# Patient Record
Sex: Female | Born: 1935 | Race: White | Hispanic: No | Smoking: Former smoker
Health system: Southern US, Community
[De-identification: ages and names within clinical notes are randomized; demographics above are authoritative.]

## PROBLEM LIST (undated history)

## (undated) DIAGNOSIS — D649 Anemia, unspecified: Secondary | ICD-10-CM

## (undated) DIAGNOSIS — K219 Gastro-esophageal reflux disease without esophagitis: Secondary | ICD-10-CM

## (undated) DIAGNOSIS — B3781 Candidal esophagitis: Secondary | ICD-10-CM

## (undated) DIAGNOSIS — Z9289 Personal history of other medical treatment: Secondary | ICD-10-CM

## (undated) DIAGNOSIS — I9589 Other hypotension: Secondary | ICD-10-CM

## (undated) DIAGNOSIS — H9190 Unspecified hearing loss, unspecified ear: Secondary | ICD-10-CM

## (undated) DIAGNOSIS — E079 Disorder of thyroid, unspecified: Secondary | ICD-10-CM

## (undated) DIAGNOSIS — F32A Depression, unspecified: Secondary | ICD-10-CM

## (undated) DIAGNOSIS — E039 Hypothyroidism, unspecified: Secondary | ICD-10-CM

## (undated) DIAGNOSIS — T7840XA Allergy, unspecified, initial encounter: Secondary | ICD-10-CM

## (undated) DIAGNOSIS — K229 Disease of esophagus, unspecified: Secondary | ICD-10-CM

## (undated) DIAGNOSIS — M810 Age-related osteoporosis without current pathological fracture: Secondary | ICD-10-CM

## (undated) DIAGNOSIS — I219 Acute myocardial infarction, unspecified: Secondary | ICD-10-CM

## (undated) DIAGNOSIS — E785 Hyperlipidemia, unspecified: Secondary | ICD-10-CM

## (undated) DIAGNOSIS — F419 Anxiety disorder, unspecified: Secondary | ICD-10-CM

## (undated) DIAGNOSIS — F329 Major depressive disorder, single episode, unspecified: Secondary | ICD-10-CM

## (undated) DIAGNOSIS — C649 Malignant neoplasm of unspecified kidney, except renal pelvis: Secondary | ICD-10-CM

## (undated) HISTORY — DX: Disorder of thyroid, unspecified: E07.9

## (undated) HISTORY — PX: BLEPHAROPLASTY: SUR158

## (undated) HISTORY — PX: OTHER SURGICAL HISTORY: SHX169

## (undated) HISTORY — DX: Gastro-esophageal reflux disease without esophagitis: K21.9

## (undated) HISTORY — DX: Anxiety disorder, unspecified: F41.9

## (undated) HISTORY — PX: CORONARY ARTERY BYPASS GRAFT: SHX141

## (undated) HISTORY — PX: COLONOSCOPY: SHX174

## (undated) HISTORY — DX: Depression, unspecified: F32.A

## (undated) HISTORY — DX: Age-related osteoporosis without current pathological fracture: M81.0

## (undated) HISTORY — DX: Malignant neoplasm of unspecified kidney, except renal pelvis: C64.9

## (undated) HISTORY — DX: Anemia, unspecified: D64.9

## (undated) HISTORY — DX: Acute myocardial infarction, unspecified: I21.9

## (undated) HISTORY — PX: ABDOMINAL HYSTERECTOMY: SHX81

## (undated) HISTORY — DX: Candidal esophagitis: B37.81

## (undated) HISTORY — PX: KYPHOSIS SURGERY: SHX114

## (undated) HISTORY — PX: FRACTURE SURGERY: SHX138

## (undated) HISTORY — DX: Allergy, unspecified, initial encounter: T78.40XA

## (undated) HISTORY — DX: Hyperlipidemia, unspecified: E78.5

## (undated) HISTORY — PX: TONSILLECTOMY: SUR1361

## (undated) HISTORY — PX: APPENDECTOMY: SHX54

## (undated) HISTORY — DX: Major depressive disorder, single episode, unspecified: F32.9

## (undated) HISTORY — PX: EYE SURGERY: SHX253

## (undated) HISTORY — PX: TOTAL ABDOMINAL HYSTERECTOMY W/ BILATERAL SALPINGOOPHORECTOMY: SHX83

---

## 2003-08-07 ENCOUNTER — Ambulatory Visit (HOSPITAL_COMMUNITY): Admission: RE | Admit: 2003-08-07 | Discharge: 2003-08-07 | Payer: Self-pay | Admitting: Specialist

## 2003-08-17 ENCOUNTER — Ambulatory Visit (HOSPITAL_COMMUNITY): Admission: RE | Admit: 2003-08-17 | Discharge: 2003-08-17 | Payer: Self-pay | Admitting: Specialist

## 2003-08-29 ENCOUNTER — Ambulatory Visit (HOSPITAL_COMMUNITY): Admission: RE | Admit: 2003-08-29 | Discharge: 2003-08-30 | Payer: Self-pay | Admitting: Specialist

## 2003-08-30 ENCOUNTER — Encounter (INDEPENDENT_AMBULATORY_CARE_PROVIDER_SITE_OTHER): Payer: Self-pay | Admitting: *Deleted

## 2004-10-25 ENCOUNTER — Ambulatory Visit: Payer: Self-pay | Admitting: Family Medicine

## 2005-03-28 ENCOUNTER — Ambulatory Visit: Payer: Self-pay | Admitting: Family Medicine

## 2005-04-08 ENCOUNTER — Ambulatory Visit: Payer: Self-pay | Admitting: Family Medicine

## 2006-04-10 ENCOUNTER — Ambulatory Visit: Payer: Self-pay | Admitting: Family Medicine

## 2006-05-14 ENCOUNTER — Ambulatory Visit: Payer: Self-pay | Admitting: Family Medicine

## 2007-01-12 DIAGNOSIS — F411 Generalized anxiety disorder: Secondary | ICD-10-CM | POA: Insufficient documentation

## 2007-01-12 DIAGNOSIS — I252 Old myocardial infarction: Secondary | ICD-10-CM

## 2007-01-12 DIAGNOSIS — F329 Major depressive disorder, single episode, unspecified: Secondary | ICD-10-CM | POA: Insufficient documentation

## 2007-04-12 ENCOUNTER — Ambulatory Visit: Payer: Self-pay | Admitting: Family Medicine

## 2007-04-12 DIAGNOSIS — E8941 Symptomatic postprocedural ovarian failure: Secondary | ICD-10-CM

## 2007-04-12 DIAGNOSIS — E785 Hyperlipidemia, unspecified: Secondary | ICD-10-CM | POA: Insufficient documentation

## 2007-04-13 ENCOUNTER — Encounter: Payer: Self-pay | Admitting: Family Medicine

## 2007-04-15 ENCOUNTER — Telehealth (INDEPENDENT_AMBULATORY_CARE_PROVIDER_SITE_OTHER): Payer: Self-pay | Admitting: *Deleted

## 2007-04-20 ENCOUNTER — Encounter: Payer: Self-pay | Admitting: Family Medicine

## 2007-04-22 ENCOUNTER — Ambulatory Visit: Payer: Self-pay | Admitting: Family Medicine

## 2007-04-27 ENCOUNTER — Encounter (INDEPENDENT_AMBULATORY_CARE_PROVIDER_SITE_OTHER): Payer: Self-pay | Admitting: *Deleted

## 2007-05-06 ENCOUNTER — Telehealth: Payer: Self-pay | Admitting: Family Medicine

## 2007-05-06 ENCOUNTER — Ambulatory Visit: Payer: Self-pay | Admitting: Family Medicine

## 2007-05-14 LAB — CONVERTED CEMR LAB
BUN: 10 mg/dL (ref 6–23)
Basophils Absolute: 0 10*3/uL (ref 0.0–0.1)
Basophils Relative: 0.4 % (ref 0.0–1.0)
CO2: 29 meq/L (ref 19–32)
Calcium: 9.6 mg/dL (ref 8.4–10.5)
Chloride: 105 meq/L (ref 96–112)
Creatinine, Ser: 1.3 mg/dL — ABNORMAL HIGH (ref 0.4–1.2)
Eosinophils Absolute: 0.1 10*3/uL (ref 0.0–0.6)
Eosinophils Relative: 1 % (ref 0.0–5.0)
Ferritin: 33.2 ng/mL (ref 10.0–291.0)
GFR calc Af Amer: 51 mL/min
GFR calc non Af Amer: 42 mL/min
Glucose, Bld: 120 mg/dL — ABNORMAL HIGH (ref 70–99)
HCT: 35.5 % — ABNORMAL LOW (ref 36.0–46.0)
Hemoglobin: 11.7 g/dL — ABNORMAL LOW (ref 12.0–15.0)
Iron: 111 ug/dL (ref 42–145)
Lymphocytes Relative: 39.7 % (ref 12.0–46.0)
MCHC: 32.9 g/dL (ref 30.0–36.0)
MCV: 99.2 fL (ref 78.0–100.0)
Monocytes Absolute: 0.6 10*3/uL (ref 0.2–0.7)
Monocytes Relative: 9.3 % (ref 3.0–11.0)
Neutro Abs: 3.1 10*3/uL (ref 1.4–7.7)
Neutrophils Relative %: 49.6 % (ref 43.0–77.0)
Platelets: 313 10*3/uL (ref 150–400)
Potassium: 3.5 meq/L (ref 3.5–5.1)
RBC: 3.58 M/uL — ABNORMAL LOW (ref 3.87–5.11)
RDW: 12.1 % (ref 11.5–14.6)
Sodium: 140 meq/L (ref 135–145)
Transferrin: 232.7 mg/dL (ref >=212.0)
WBC: 6.3 10*3/uL (ref 4.5–10.5)

## 2007-08-03 ENCOUNTER — Encounter: Payer: Self-pay | Admitting: Family Medicine

## 2007-08-16 ENCOUNTER — Telehealth (INDEPENDENT_AMBULATORY_CARE_PROVIDER_SITE_OTHER): Payer: Self-pay | Admitting: *Deleted

## 2007-08-27 ENCOUNTER — Telehealth (INDEPENDENT_AMBULATORY_CARE_PROVIDER_SITE_OTHER): Payer: Self-pay | Admitting: *Deleted

## 2007-08-31 ENCOUNTER — Telehealth (INDEPENDENT_AMBULATORY_CARE_PROVIDER_SITE_OTHER): Payer: Self-pay | Admitting: *Deleted

## 2007-09-06 ENCOUNTER — Telehealth (INDEPENDENT_AMBULATORY_CARE_PROVIDER_SITE_OTHER): Payer: Self-pay | Admitting: *Deleted

## 2007-09-08 ENCOUNTER — Telehealth (INDEPENDENT_AMBULATORY_CARE_PROVIDER_SITE_OTHER): Payer: Self-pay | Admitting: *Deleted

## 2007-10-28 ENCOUNTER — Ambulatory Visit: Payer: Self-pay | Admitting: Family Medicine

## 2007-11-02 ENCOUNTER — Encounter: Payer: Self-pay | Admitting: Family Medicine

## 2008-01-04 ENCOUNTER — Telehealth (INDEPENDENT_AMBULATORY_CARE_PROVIDER_SITE_OTHER): Payer: Self-pay | Admitting: *Deleted

## 2008-01-04 ENCOUNTER — Encounter (INDEPENDENT_AMBULATORY_CARE_PROVIDER_SITE_OTHER): Payer: Self-pay | Admitting: *Deleted

## 2008-01-06 ENCOUNTER — Encounter (INDEPENDENT_AMBULATORY_CARE_PROVIDER_SITE_OTHER): Payer: Self-pay | Admitting: *Deleted

## 2008-01-06 ENCOUNTER — Telehealth (INDEPENDENT_AMBULATORY_CARE_PROVIDER_SITE_OTHER): Payer: Self-pay | Admitting: *Deleted

## 2008-02-07 ENCOUNTER — Ambulatory Visit: Payer: Self-pay | Admitting: Family Medicine

## 2008-02-28 ENCOUNTER — Ambulatory Visit: Payer: Self-pay | Admitting: Cardiovascular Disease

## 2008-02-28 ENCOUNTER — Ambulatory Visit: Payer: Self-pay | Admitting: Internal Medicine

## 2008-02-28 ENCOUNTER — Inpatient Hospital Stay (HOSPITAL_COMMUNITY): Admission: EM | Admit: 2008-02-28 | Discharge: 2008-03-02 | Payer: Self-pay | Admitting: Emergency Medicine

## 2008-03-01 ENCOUNTER — Encounter: Payer: Self-pay | Admitting: Internal Medicine

## 2008-03-06 ENCOUNTER — Ambulatory Visit: Payer: Self-pay | Admitting: Family Medicine

## 2008-03-06 ENCOUNTER — Telehealth (INDEPENDENT_AMBULATORY_CARE_PROVIDER_SITE_OTHER): Payer: Self-pay | Admitting: *Deleted

## 2008-03-06 DIAGNOSIS — R5383 Other fatigue: Secondary | ICD-10-CM

## 2008-03-06 DIAGNOSIS — R5381 Other malaise: Secondary | ICD-10-CM

## 2008-03-06 DIAGNOSIS — R531 Weakness: Secondary | ICD-10-CM | POA: Insufficient documentation

## 2008-03-06 DIAGNOSIS — N390 Urinary tract infection, site not specified: Secondary | ICD-10-CM

## 2008-03-06 LAB — CONVERTED CEMR LAB
Bilirubin Urine: NEGATIVE
Glucose, Urine, Semiquant: NEGATIVE
Ketones, urine, test strip: NEGATIVE
Nitrite: NEGATIVE
Specific Gravity, Urine: 1.01
Urobilinogen, UA: NEGATIVE
WBC Urine, dipstick: NEGATIVE
pH: 6

## 2008-03-07 ENCOUNTER — Ambulatory Visit: Payer: Self-pay | Admitting: Family Medicine

## 2008-03-08 ENCOUNTER — Encounter (INDEPENDENT_AMBULATORY_CARE_PROVIDER_SITE_OTHER): Payer: Self-pay | Admitting: *Deleted

## 2008-03-10 ENCOUNTER — Telehealth (INDEPENDENT_AMBULATORY_CARE_PROVIDER_SITE_OTHER): Payer: Self-pay | Admitting: *Deleted

## 2008-03-13 ENCOUNTER — Telehealth (INDEPENDENT_AMBULATORY_CARE_PROVIDER_SITE_OTHER): Payer: Self-pay | Admitting: *Deleted

## 2008-03-15 ENCOUNTER — Encounter: Payer: Self-pay | Admitting: Family Medicine

## 2008-03-16 ENCOUNTER — Ambulatory Visit: Payer: Self-pay | Admitting: Family Medicine

## 2008-03-16 DIAGNOSIS — R944 Abnormal results of kidney function studies: Secondary | ICD-10-CM

## 2008-03-16 LAB — CONVERTED CEMR LAB
BUN: 9 mg/dL (ref 6–23)
Basophils Absolute: 0.1 10*3/uL (ref 0.0–0.1)
Basophils Relative: 1 % (ref 0.0–1.0)
CO2: 34 meq/L — ABNORMAL HIGH (ref 19–32)
Calcium: 13.6 mg/dL (ref 8.4–10.5)
Chloride: 99 meq/L (ref 96–112)
Creatinine, Ser: 1.4 mg/dL — ABNORMAL HIGH (ref 0.4–1.2)
Eosinophils Absolute: 0.1 10*3/uL (ref 0.0–0.7)
Eosinophils Relative: 1 % (ref 0.0–5.0)
Folate: >20 ng/mL
Free T4: 1 ng/dL (ref 0.6–1.6)
GFR calc Af Amer: 47 mL/min
GFR calc non Af Amer: 39 mL/min
Glucose, Bld: 87 mg/dL (ref 70–99)
HCT: 34.8 % — ABNORMAL LOW (ref 36.0–46.0)
Hemoglobin: 12 g/dL (ref 12.0–15.0)
Lymphocytes Relative: 22.6 % (ref 12.0–46.0)
MCHC: 34.4 g/dL (ref 30.0–36.0)
MCV: 98.1 fL (ref 78.0–100.0)
Monocytes Absolute: 0.7 10*3/uL (ref 0.1–1.0)
Monocytes Relative: 8.3 % (ref 3.0–12.0)
Neutro Abs: 5.9 10*3/uL (ref 1.4–7.7)
Neutrophils Relative %: 67.1 % (ref 43.0–77.0)
Platelets: 289 10*3/uL (ref 150–400)
Potassium: 3.5 meq/L (ref 3.5–5.1)
RBC: 3.54 M/uL — ABNORMAL LOW (ref 3.87–5.11)
RDW: 11.6 % (ref 11.5–14.6)
Sodium: 143 meq/L (ref 135–145)
T3, Free: 2.6 pg/mL (ref 2.3–4.2)
TSH: 6.86 microintl units/mL — ABNORMAL HIGH (ref 0.35–5.50)
Vitamin B-12: 1142 pg/mL — ABNORMAL HIGH (ref 211–911)
WBC: 8.8 10*3/uL (ref 4.5–10.5)

## 2008-03-17 ENCOUNTER — Telehealth (INDEPENDENT_AMBULATORY_CARE_PROVIDER_SITE_OTHER): Payer: Self-pay | Admitting: *Deleted

## 2008-03-17 LAB — CONVERTED CEMR LAB
BUN: 16 mg/dL (ref 6–23)
CO2: 29 meq/L (ref 19–32)
Calcium: 12.9 mg/dL — ABNORMAL HIGH (ref 8.4–10.5)
Chloride: 95 meq/L — ABNORMAL LOW (ref 96–112)
Creatinine, Ser: 1.7 mg/dL — ABNORMAL HIGH (ref 0.4–1.2)
GFR calc Af Amer: 37 mL/min
GFR calc non Af Amer: 31 mL/min
Glucose, Bld: 89 mg/dL (ref 70–99)
Potassium: 3.8 meq/L (ref 3.5–5.1)
Sodium: 132 meq/L — ABNORMAL LOW (ref 135–145)

## 2008-03-21 ENCOUNTER — Ambulatory Visit: Payer: Self-pay | Admitting: Family Medicine

## 2008-03-22 LAB — CONVERTED CEMR LAB
ALT: 20 units/L (ref 0–35)
AST: 31 units/L (ref 0–37)
Albumin: 3.6 g/dL (ref 3.5–5.2)
Alkaline Phosphatase: 57 units/L (ref 39–117)
BUN: 17 mg/dL (ref 6–23)
Basophils Absolute: 0.1 10*3/uL (ref 0.0–0.1)
Basophils Relative: 1.4 % (ref 0.0–3.0)
Bilirubin, Direct: 0.1 mg/dL (ref 0.0–0.3)
CO2: 27 meq/L (ref 19–32)
Calcium: 12.1 mg/dL — ABNORMAL HIGH (ref 8.4–10.5)
Chloride: 101 meq/L (ref 96–112)
Creatinine, Ser: 1.8 mg/dL — ABNORMAL HIGH (ref 0.4–1.2)
Eosinophils Absolute: 0.1 10*3/uL (ref 0.0–0.7)
Eosinophils Relative: 1.2 % (ref 0.0–5.0)
Folate: >20 ng/mL
GFR calc Af Amer: 35 mL/min
GFR calc non Af Amer: 29 mL/min
Glucose, Bld: 94 mg/dL (ref 70–99)
HCT: 30.3 % — ABNORMAL LOW (ref 36.0–46.0)
Hemoglobin: 10 g/dL — ABNORMAL LOW (ref 12.0–15.0)
Lymphocytes Relative: 29 % (ref 12.0–46.0)
MCHC: 33.1 g/dL (ref 30.0–36.0)
MCV: 97 fL (ref 78.0–100.0)
Monocytes Absolute: 0.5 10*3/uL (ref 0.1–1.0)
Monocytes Relative: 7.8 % (ref 3.0–12.0)
Neutro Abs: 3.5 10*3/uL (ref 1.4–7.7)
Neutrophils Relative %: 60.6 % (ref 43.0–77.0)
Platelets: 262 10*3/uL (ref 150–400)
Potassium: 4.2 meq/L (ref 3.5–5.1)
RBC: 3.12 M/uL — ABNORMAL LOW (ref 3.87–5.11)
RDW: 11 % — ABNORMAL LOW (ref 11.5–14.6)
Sodium: 136 meq/L (ref 135–145)
TSH: 4.53 microintl units/mL (ref 0.35–5.50)
Total Bilirubin: 0.5 mg/dL (ref 0.3–1.2)
Total Protein: 6.3 g/dL (ref 6.0–8.3)
Vitamin B-12: >1500 pg/mL — ABNORMAL HIGH (ref 211–911)
WBC: 5.9 10*3/uL (ref 4.5–10.5)

## 2008-03-23 ENCOUNTER — Encounter: Payer: Self-pay | Admitting: Family Medicine

## 2008-03-23 ENCOUNTER — Encounter (INDEPENDENT_AMBULATORY_CARE_PROVIDER_SITE_OTHER): Payer: Self-pay | Admitting: *Deleted

## 2008-03-23 ENCOUNTER — Telehealth (INDEPENDENT_AMBULATORY_CARE_PROVIDER_SITE_OTHER): Payer: Self-pay | Admitting: *Deleted

## 2008-03-23 LAB — CONVERTED CEMR LAB: Vit D, 1,25-Dihydroxy: 61 (ref 30–89)

## 2008-03-24 ENCOUNTER — Ambulatory Visit: Payer: Self-pay | Admitting: Family Medicine

## 2008-03-26 ENCOUNTER — Telehealth (INDEPENDENT_AMBULATORY_CARE_PROVIDER_SITE_OTHER): Payer: Self-pay | Admitting: *Deleted

## 2008-03-26 LAB — CONVERTED CEMR LAB
Basophils Absolute: 0 10*3/uL (ref 0.0–0.1)
Basophils Relative: 0.7 % (ref 0.0–3.0)
Lymphocytes Relative: 33.9 % (ref 12.0–46.0)
MCHC: 33.1 g/dL (ref 30.0–36.0)
Neutrophils Relative %: 53.9 % (ref 43.0–77.0)
RBC: 2.81 M/uL — ABNORMAL LOW (ref 3.87–5.11)
WBC: 6.1 10*3/uL (ref 4.5–10.5)

## 2008-03-28 ENCOUNTER — Telehealth (INDEPENDENT_AMBULATORY_CARE_PROVIDER_SITE_OTHER): Payer: Self-pay | Admitting: *Deleted

## 2008-03-28 ENCOUNTER — Encounter (INDEPENDENT_AMBULATORY_CARE_PROVIDER_SITE_OTHER): Payer: Self-pay | Admitting: *Deleted

## 2008-03-28 ENCOUNTER — Ambulatory Visit: Payer: Self-pay | Admitting: Family Medicine

## 2008-03-28 LAB — CONVERTED CEMR LAB
Albumin, U: DETECTED %
Beta, Urine: DETECTED % — AB
Free Kappa/Lambda Ratio: 6.21 — ABNORMAL HIGH (ref 0.46–4.00)
OCCULT 1: NEGATIVE
OCCULT 2: NEGATIVE
OCCULT 3: NEGATIVE
Time: 24
Total Protein, Urine-Ur/day: 43 mg/24hr (ref 10–140)

## 2008-03-31 ENCOUNTER — Ambulatory Visit: Payer: Self-pay | Admitting: Family Medicine

## 2008-03-31 DIAGNOSIS — D649 Anemia, unspecified: Secondary | ICD-10-CM

## 2008-03-31 DIAGNOSIS — D631 Anemia in chronic kidney disease: Secondary | ICD-10-CM | POA: Insufficient documentation

## 2008-03-31 LAB — CONVERTED CEMR LAB: Hemoglobin: 9.9 g/dL

## 2008-04-03 ENCOUNTER — Telehealth (INDEPENDENT_AMBULATORY_CARE_PROVIDER_SITE_OTHER): Payer: Self-pay | Admitting: *Deleted

## 2008-04-06 ENCOUNTER — Telehealth (INDEPENDENT_AMBULATORY_CARE_PROVIDER_SITE_OTHER): Payer: Self-pay | Admitting: *Deleted

## 2008-04-07 ENCOUNTER — Ambulatory Visit: Payer: Self-pay | Admitting: Oncology

## 2008-04-10 ENCOUNTER — Telehealth: Payer: Self-pay | Admitting: Family Medicine

## 2008-04-12 ENCOUNTER — Encounter: Payer: Self-pay | Admitting: Family Medicine

## 2008-04-12 LAB — CBC & DIFF AND RETIC
EOS%: 0.5 % (ref 0.0–7.0)
MCH: 33 pg (ref 26.0–34.0)
MCV: 95.8 fL (ref 81.0–101.0)
MONO%: 9.2 % (ref 0.0–13.0)
NEUT#: 3.5 10*3/uL (ref 1.5–6.5)
RBC: 3.12 10*6/uL — ABNORMAL LOW (ref 3.70–5.32)
RDW: 12.2 % (ref 11.3–14.5)
RETIC #: 15.9 10*3/uL — ABNORMAL LOW (ref 19.7–115.1)
Retic %: 0.5 % (ref 0.4–2.3)
lymph#: 1.3 10*3/uL (ref 0.9–3.3)

## 2008-04-12 LAB — COMPREHENSIVE METABOLIC PANEL
AST: 31 U/L (ref 0–37)
Alkaline Phosphatase: 56 U/L (ref 39–117)
BUN: 11 mg/dL (ref 6–23)
Creatinine, Ser: 1.45 mg/dL — ABNORMAL HIGH (ref 0.40–1.20)
Total Bilirubin: 0.8 mg/dL (ref 0.3–1.2)

## 2008-04-12 LAB — ERYTHROCYTE SEDIMENTATION RATE: Sed Rate: 50 mm/hr — ABNORMAL HIGH (ref 0–30)

## 2008-04-14 LAB — FERRITIN: Ferritin: 230 ng/mL (ref 10–291)

## 2008-04-14 LAB — SPEP & IFE WITH QIG
Albumin ELP: 60 % (ref 55.8–66.1)
Alpha-1-Globulin: 5.5 % — ABNORMAL HIGH (ref 2.9–4.9)
IgM, Serum: 115 mg/dL (ref 60–263)

## 2008-04-14 LAB — KAPPA/LAMBDA LIGHT CHAINS
Kappa:Lambda Ratio: 0.79 (ref 0.26–1.65)
Lambda Free Lght Chn: 2.29 mg/dL (ref 0.57–2.63)

## 2008-04-18 ENCOUNTER — Encounter: Payer: Self-pay | Admitting: Oncology

## 2008-04-18 ENCOUNTER — Other Ambulatory Visit: Admission: RE | Admit: 2008-04-18 | Discharge: 2008-04-18 | Payer: Self-pay | Admitting: Oncology

## 2008-04-18 ENCOUNTER — Encounter: Payer: Self-pay | Admitting: Family Medicine

## 2008-04-24 ENCOUNTER — Encounter: Payer: Self-pay | Admitting: Family Medicine

## 2008-04-25 LAB — CBC WITH DIFFERENTIAL/PLATELET
Eosinophils Absolute: 0 10*3/uL (ref 0.0–0.5)
LYMPH%: 26.3 % (ref 14.0–48.0)
MONO#: 0.5 10*3/uL (ref 0.1–0.9)
NEUT#: 3 10*3/uL (ref 1.5–6.5)
Platelets: 279 10*3/uL (ref 145–400)
RBC: 2.88 10*6/uL — ABNORMAL LOW (ref 3.70–5.32)
RDW: 12.5 % (ref 11.3–14.5)
WBC: 4.8 10*3/uL (ref 3.9–10.0)

## 2008-04-26 LAB — BASIC METABOLIC PANEL
Chloride: 106 mEq/L (ref 96–112)
Creatinine, Ser: 1.28 mg/dL — ABNORMAL HIGH (ref 0.40–1.20)
Potassium: 3.8 mEq/L (ref 3.5–5.3)

## 2008-04-26 LAB — PTH, INTACT AND CALCIUM
Calcium, Total (PTH): 10.1 mg/dL (ref 8.4–10.5)
PTH: 8.5 pg/mL — ABNORMAL LOW (ref 14.0–72.0)

## 2008-05-08 ENCOUNTER — Encounter: Payer: Self-pay | Admitting: Family Medicine

## 2008-05-08 LAB — CBC WITH DIFFERENTIAL/PLATELET
BASO%: 0.4 % (ref 0.0–2.0)
EOS%: 1 % (ref 0.0–7.0)
HCT: 34.1 % — ABNORMAL LOW (ref 34.8–46.6)
HGB: 11.5 g/dL — ABNORMAL LOW (ref 11.6–15.9)
MCH: 34 pg (ref 26.0–34.0)
MCHC: 33.7 g/dL (ref 32.0–36.0)
MONO#: 0.5 10*3/uL (ref 0.1–0.9)
NEUT%: 55.6 % (ref 39.6–76.8)
RDW: 16.6 % — ABNORMAL HIGH (ref 11.3–14.5)
WBC: 4.2 10*3/uL (ref 3.9–10.0)
lymph#: 1.3 10*3/uL (ref 0.9–3.3)

## 2008-05-10 ENCOUNTER — Ambulatory Visit: Payer: Self-pay | Admitting: Family Medicine

## 2008-05-10 DIAGNOSIS — E039 Hypothyroidism, unspecified: Secondary | ICD-10-CM

## 2008-05-11 ENCOUNTER — Encounter: Payer: Self-pay | Admitting: Family Medicine

## 2008-05-15 ENCOUNTER — Encounter (INDEPENDENT_AMBULATORY_CARE_PROVIDER_SITE_OTHER): Payer: Self-pay | Admitting: *Deleted

## 2008-05-15 ENCOUNTER — Telehealth (INDEPENDENT_AMBULATORY_CARE_PROVIDER_SITE_OTHER): Payer: Self-pay | Admitting: *Deleted

## 2008-05-16 ENCOUNTER — Telehealth (INDEPENDENT_AMBULATORY_CARE_PROVIDER_SITE_OTHER): Payer: Self-pay | Admitting: *Deleted

## 2008-05-22 LAB — CBC WITH DIFFERENTIAL/PLATELET
Basophils Absolute: 0 10*3/uL (ref 0.0–0.1)
EOS%: 0.8 % (ref 0.0–7.0)
Eosinophils Absolute: 0.1 10*3/uL (ref 0.0–0.5)
HCT: 40.2 % (ref 34.8–46.6)
HGB: 13.4 g/dL (ref 11.6–15.9)
MCH: 33.8 pg (ref 26.0–34.0)
MONO#: 0.6 10*3/uL (ref 0.1–0.9)
NEUT#: 3.9 10*3/uL (ref 1.5–6.5)
NEUT%: 55.8 % (ref 39.6–76.8)
RDW: 16.3 % — ABNORMAL HIGH (ref 11.3–14.5)
WBC: 7 10*3/uL (ref 3.9–10.0)
lymph#: 2.4 10*3/uL (ref 0.9–3.3)

## 2008-05-23 ENCOUNTER — Encounter: Payer: Self-pay | Admitting: Family Medicine

## 2008-05-30 ENCOUNTER — Ambulatory Visit: Payer: Self-pay | Admitting: Family Medicine

## 2008-05-30 LAB — CONVERTED CEMR LAB
Bilirubin Urine: NEGATIVE
Ketones, urine, test strip: NEGATIVE
Nitrite: NEGATIVE
Specific Gravity, Urine: 1.02

## 2008-05-31 ENCOUNTER — Encounter: Payer: Self-pay | Admitting: Family Medicine

## 2008-05-31 ENCOUNTER — Telehealth (INDEPENDENT_AMBULATORY_CARE_PROVIDER_SITE_OTHER): Payer: Self-pay | Admitting: *Deleted

## 2008-06-01 ENCOUNTER — Inpatient Hospital Stay (HOSPITAL_COMMUNITY): Admission: EM | Admit: 2008-06-01 | Discharge: 2008-06-07 | Payer: Self-pay | Admitting: Emergency Medicine

## 2008-06-01 ENCOUNTER — Ambulatory Visit: Payer: Self-pay | Admitting: Internal Medicine

## 2008-06-02 ENCOUNTER — Encounter: Payer: Self-pay | Admitting: Family Medicine

## 2008-06-03 ENCOUNTER — Encounter: Payer: Self-pay | Admitting: Family Medicine

## 2008-06-05 ENCOUNTER — Encounter (INDEPENDENT_AMBULATORY_CARE_PROVIDER_SITE_OTHER): Payer: Self-pay | Admitting: *Deleted

## 2008-06-06 ENCOUNTER — Encounter: Payer: Self-pay | Admitting: Family Medicine

## 2008-06-08 ENCOUNTER — Encounter (INDEPENDENT_AMBULATORY_CARE_PROVIDER_SITE_OTHER): Payer: Self-pay | Admitting: *Deleted

## 2008-06-08 LAB — CONVERTED CEMR LAB
Basophils Absolute: 0.2 10*3/uL — ABNORMAL HIGH (ref 0.0–0.1)
Basophils Relative: 2.6 % (ref 0.0–3.0)
Eosinophils Absolute: 0 10*3/uL (ref 0.0–0.7)
Lymphocytes Relative: 9 % — ABNORMAL LOW (ref 12.0–46.0)
MCHC: 33.8 g/dL (ref 30.0–36.0)
MCV: 101.3 fL — ABNORMAL HIGH (ref 78.0–100.0)
Neutrophils Relative %: 84.6 % — ABNORMAL HIGH (ref 43.0–77.0)
Platelets: 228 10*3/uL (ref 150–400)
RBC: 3.94 M/uL (ref 3.87–5.11)

## 2008-06-16 ENCOUNTER — Ambulatory Visit: Payer: Self-pay | Admitting: Oncology

## 2008-06-20 LAB — CBC WITH DIFFERENTIAL/PLATELET
Eosinophils Absolute: 0 10*3/uL (ref 0.0–0.5)
MCV: 97.6 fL (ref 81.0–101.0)
MONO#: 0.3 10*3/uL (ref 0.1–0.9)
MONO%: 9.2 % (ref 0.0–13.0)
NEUT#: 2 10*3/uL (ref 1.5–6.5)
RBC: 3.7 10*6/uL (ref 3.70–5.32)
RDW: 13.4 % (ref 11.3–14.5)
WBC: 3.6 10*3/uL — ABNORMAL LOW (ref 3.9–10.0)
lymph#: 1.2 10*3/uL (ref 0.9–3.3)

## 2008-06-23 ENCOUNTER — Ambulatory Visit: Payer: Self-pay | Admitting: Family Medicine

## 2008-06-30 ENCOUNTER — Encounter (INDEPENDENT_AMBULATORY_CARE_PROVIDER_SITE_OTHER): Payer: Self-pay | Admitting: *Deleted

## 2008-06-30 LAB — CONVERTED CEMR LAB
Basophils Absolute: 0 10*3/uL (ref 0.0–0.1)
CO2: 21 meq/L (ref 19–32)
Calcium: 8.8 mg/dL (ref 8.4–10.5)
Chloride: 97 meq/L (ref 96–112)
Creatinine, Ser: 1.16 mg/dL (ref 0.40–1.20)
Eosinophils Relative: 1 % (ref 0–5)
Glucose, Bld: 97 mg/dL (ref 70–99)
HCT: 33 % — ABNORMAL LOW (ref 36.0–46.0)
Hemoglobin: 11 g/dL — ABNORMAL LOW (ref 12.0–15.0)
Lymphocytes Relative: 38 % (ref 12–46)
Lymphs Abs: 2 10*3/uL (ref 0.7–4.0)
Monocytes Absolute: 0.6 10*3/uL (ref 0.1–1.0)
Monocytes Relative: 12 % (ref 3–12)
Neutro Abs: 2.6 10*3/uL (ref 1.7–7.7)
RBC: 3.41 M/uL — ABNORMAL LOW (ref 3.87–5.11)
RDW: 13.1 % (ref 11.5–15.5)
WBC: 5.2 10*3/uL (ref 4.0–10.5)

## 2008-07-18 ENCOUNTER — Encounter: Payer: Self-pay | Admitting: Family Medicine

## 2008-07-18 LAB — CBC WITH DIFFERENTIAL/PLATELET
BASO%: 0.3 % (ref 0.0–2.0)
EOS%: 1.2 % (ref 0.0–7.0)
MCH: 33 pg (ref 26.0–34.0)
MCHC: 34.1 g/dL (ref 32.0–36.0)
MCV: 96.7 fL (ref 81.0–101.0)
MONO%: 9.5 % (ref 0.0–13.0)
RBC: 3.49 10*6/uL — ABNORMAL LOW (ref 3.70–5.32)
RDW: 13.2 % (ref 11.3–14.5)

## 2008-08-03 ENCOUNTER — Encounter: Payer: Self-pay | Admitting: Family Medicine

## 2008-08-15 ENCOUNTER — Ambulatory Visit: Payer: Self-pay | Admitting: Oncology

## 2008-08-16 ENCOUNTER — Ambulatory Visit: Payer: Self-pay | Admitting: Family Medicine

## 2008-08-17 LAB — CONVERTED CEMR LAB
AST: 31 units/L (ref 0–37)
Albumin: 3.8 g/dL (ref 3.5–5.2)
Alkaline Phosphatase: 78 units/L (ref 39–117)
LDL Cholesterol: 79 mg/dL (ref 0–99)
Total Bilirubin: 0.5 mg/dL (ref 0.3–1.2)
Total CHOL/HDL Ratio: 2.5
Triglycerides: 101 mg/dL (ref 0–149)

## 2008-08-21 ENCOUNTER — Encounter (INDEPENDENT_AMBULATORY_CARE_PROVIDER_SITE_OTHER): Payer: Self-pay | Admitting: *Deleted

## 2008-08-21 LAB — CBC WITH DIFFERENTIAL/PLATELET
BASO%: 0.6 % (ref 0.0–2.0)
Basophils Absolute: 0 10*3/uL (ref 0.0–0.1)
EOS%: 2.9 % (ref 0.0–7.0)
HCT: 30.3 % — ABNORMAL LOW (ref 34.8–46.6)
HGB: 10.2 g/dL — ABNORMAL LOW (ref 11.6–15.9)
MCH: 32.3 pg (ref 26.0–34.0)
MCHC: 33.7 g/dL (ref 32.0–36.0)
MCV: 96 fL (ref 81.0–101.0)
MONO%: 8.8 % (ref 0.0–13.0)
NEUT%: 53.4 % (ref 39.6–76.8)

## 2008-09-06 LAB — CBC WITH DIFFERENTIAL/PLATELET
Basophils Absolute: 0 10*3/uL (ref 0.0–0.1)
EOS%: 2.4 % (ref 0.0–7.0)
HCT: 32.8 % — ABNORMAL LOW (ref 34.8–46.6)
HGB: 11.3 g/dL — ABNORMAL LOW (ref 11.6–15.9)
LYMPH%: 27.6 % (ref 14.0–48.0)
MCH: 33.1 pg (ref 26.0–34.0)
MCHC: 34.4 g/dL (ref 32.0–36.0)
MONO#: 0.5 10*3/uL (ref 0.1–0.9)
NEUT%: 59.4 % (ref 39.6–76.8)
Platelets: 255 10*3/uL (ref 145–400)
lymph#: 1.4 10*3/uL (ref 0.9–3.3)

## 2008-09-20 ENCOUNTER — Telehealth (INDEPENDENT_AMBULATORY_CARE_PROVIDER_SITE_OTHER): Payer: Self-pay | Admitting: *Deleted

## 2008-10-12 ENCOUNTER — Ambulatory Visit: Payer: Self-pay | Admitting: Oncology

## 2008-10-16 ENCOUNTER — Encounter: Payer: Self-pay | Admitting: Family Medicine

## 2008-10-16 LAB — CBC WITH DIFFERENTIAL/PLATELET
BASO%: 0.4 % (ref 0.0–2.0)
EOS%: 2 % (ref 0.0–7.0)
HCT: 36.3 % (ref 34.8–46.6)
LYMPH%: 29.4 % (ref 14.0–49.7)
MCH: 33.8 pg (ref 25.1–34.0)
MCHC: 34.1 g/dL (ref 31.5–36.0)
MCV: 99.2 fL (ref 79.5–101.0)
MONO%: 8.7 % (ref 0.0–14.0)
NEUT%: 59.5 % (ref 38.4–76.8)
Platelets: 296 10*3/uL (ref 145–400)

## 2008-11-06 ENCOUNTER — Telehealth (INDEPENDENT_AMBULATORY_CARE_PROVIDER_SITE_OTHER): Payer: Self-pay | Admitting: *Deleted

## 2008-11-08 ENCOUNTER — Telehealth (INDEPENDENT_AMBULATORY_CARE_PROVIDER_SITE_OTHER): Payer: Self-pay | Admitting: *Deleted

## 2008-12-12 ENCOUNTER — Encounter: Payer: Self-pay | Admitting: Family Medicine

## 2008-12-14 ENCOUNTER — Telehealth: Payer: Self-pay | Admitting: Family Medicine

## 2009-01-10 ENCOUNTER — Ambulatory Visit: Payer: Self-pay | Admitting: Oncology

## 2009-01-12 LAB — CBC WITH DIFFERENTIAL/PLATELET
BASO%: 0.4 % (ref 0.0–2.0)
Eosinophils Absolute: 0.1 10*3/uL (ref 0.0–0.5)
HCT: 33.5 % — ABNORMAL LOW (ref 34.8–46.6)
LYMPH%: 32.8 % (ref 14.0–49.7)
MCHC: 34.1 g/dL (ref 31.5–36.0)
MONO#: 0.5 10*3/uL (ref 0.1–0.9)
NEUT%: 56.7 % (ref 38.4–76.8)
Platelets: 229 10*3/uL (ref 145–400)
WBC: 5.5 10*3/uL (ref 3.9–10.3)

## 2009-01-16 ENCOUNTER — Encounter: Payer: Self-pay | Admitting: Family Medicine

## 2009-04-09 ENCOUNTER — Telehealth (INDEPENDENT_AMBULATORY_CARE_PROVIDER_SITE_OTHER): Payer: Self-pay | Admitting: *Deleted

## 2009-04-16 ENCOUNTER — Encounter: Payer: Self-pay | Admitting: Family Medicine

## 2009-04-22 ENCOUNTER — Ambulatory Visit: Payer: Self-pay | Admitting: Internal Medicine

## 2009-04-22 ENCOUNTER — Encounter: Payer: Self-pay | Admitting: Internal Medicine

## 2009-04-22 ENCOUNTER — Inpatient Hospital Stay (HOSPITAL_COMMUNITY): Admission: EM | Admit: 2009-04-22 | Discharge: 2009-04-24 | Payer: Self-pay | Admitting: Emergency Medicine

## 2009-04-24 ENCOUNTER — Ambulatory Visit: Payer: Self-pay | Admitting: Oncology

## 2009-05-08 ENCOUNTER — Encounter: Payer: Self-pay | Admitting: Family Medicine

## 2009-05-11 ENCOUNTER — Encounter: Payer: Self-pay | Admitting: Family Medicine

## 2009-05-11 LAB — CBC WITH DIFFERENTIAL/PLATELET
Eosinophils Absolute: 0.1 10*3/uL (ref 0.0–0.5)
HCT: 39 % (ref 34.8–46.6)
HGB: 13.3 g/dL (ref 11.6–15.9)
LYMPH%: 29.2 % (ref 14.0–49.7)
MONO#: 0.5 10*3/uL (ref 0.1–0.9)
NEUT#: 3.5 10*3/uL (ref 1.5–6.5)
Platelets: 307 10*3/uL (ref 145–400)
RBC: 3.96 10*6/uL (ref 3.70–5.45)
WBC: 5.8 10*3/uL (ref 3.9–10.3)

## 2009-05-17 ENCOUNTER — Ambulatory Visit: Payer: Self-pay | Admitting: Family Medicine

## 2009-05-18 ENCOUNTER — Encounter: Payer: Self-pay | Admitting: Family Medicine

## 2009-05-18 ENCOUNTER — Telehealth: Payer: Self-pay | Admitting: Family Medicine

## 2009-05-22 ENCOUNTER — Ambulatory Visit: Payer: Self-pay | Admitting: Family Medicine

## 2009-05-22 ENCOUNTER — Telehealth: Payer: Self-pay | Admitting: Family Medicine

## 2009-05-23 ENCOUNTER — Encounter: Payer: Self-pay | Admitting: Family Medicine

## 2009-05-23 LAB — CONVERTED CEMR LAB
OCCULT 1: NEGATIVE
OCCULT 2: NEGATIVE
OCCULT 3: NEGATIVE

## 2009-06-05 ENCOUNTER — Encounter: Payer: Self-pay | Admitting: Family Medicine

## 2009-06-20 ENCOUNTER — Ambulatory Visit: Payer: Self-pay | Admitting: Oncology

## 2009-06-22 ENCOUNTER — Encounter: Payer: Self-pay | Admitting: Family Medicine

## 2009-06-22 LAB — CBC WITH DIFFERENTIAL/PLATELET
Eosinophils Absolute: 0.1 10*3/uL (ref 0.0–0.5)
MONO#: 0.7 10*3/uL (ref 0.1–0.9)
NEUT#: 4 10*3/uL (ref 1.5–6.5)
RBC: 3.99 10*6/uL (ref 3.70–5.45)
RDW: 12.6 % (ref 11.2–14.5)
WBC: 6.7 10*3/uL (ref 3.9–10.3)
lymph#: 1.9 10*3/uL (ref 0.9–3.3)

## 2009-07-23 ENCOUNTER — Encounter: Payer: Self-pay | Admitting: Family Medicine

## 2009-08-01 ENCOUNTER — Telehealth (INDEPENDENT_AMBULATORY_CARE_PROVIDER_SITE_OTHER): Payer: Self-pay | Admitting: *Deleted

## 2009-08-03 ENCOUNTER — Encounter: Admission: RE | Admit: 2009-08-03 | Discharge: 2009-08-03 | Payer: Self-pay | Admitting: Plastic Surgery

## 2009-08-06 ENCOUNTER — Ambulatory Visit (HOSPITAL_BASED_OUTPATIENT_CLINIC_OR_DEPARTMENT_OTHER): Admission: RE | Admit: 2009-08-06 | Discharge: 2009-08-06 | Payer: Self-pay | Admitting: Plastic Surgery

## 2009-08-13 ENCOUNTER — Telehealth: Payer: Self-pay | Admitting: Family Medicine

## 2009-08-15 ENCOUNTER — Ambulatory Visit: Payer: Self-pay | Admitting: Family Medicine

## 2009-08-20 ENCOUNTER — Encounter (INDEPENDENT_AMBULATORY_CARE_PROVIDER_SITE_OTHER): Payer: Self-pay | Admitting: *Deleted

## 2009-09-12 ENCOUNTER — Ambulatory Visit: Payer: Self-pay | Admitting: Oncology

## 2009-09-14 LAB — CBC WITH DIFFERENTIAL/PLATELET
Basophils Absolute: 0 10*3/uL (ref 0.0–0.1)
Eosinophils Absolute: 0.1 10*3/uL (ref 0.0–0.5)
HCT: 34.3 % — ABNORMAL LOW (ref 34.8–46.6)
HGB: 11.6 g/dL (ref 11.6–15.9)
LYMPH%: 31.1 % (ref 14.0–49.7)
MCV: 100.3 fL (ref 79.5–101.0)
MONO#: 0.6 10*3/uL (ref 0.1–0.9)
NEUT#: 3 10*3/uL (ref 1.5–6.5)
Platelets: 258 10*3/uL (ref 145–400)
RBC: 3.43 10*6/uL — ABNORMAL LOW (ref 3.70–5.45)
WBC: 5.4 10*3/uL (ref 3.9–10.3)

## 2009-09-27 ENCOUNTER — Encounter: Payer: Self-pay | Admitting: Family Medicine

## 2009-10-02 ENCOUNTER — Encounter: Payer: Self-pay | Admitting: Family Medicine

## 2009-11-12 ENCOUNTER — Telehealth: Payer: Self-pay | Admitting: Family Medicine

## 2009-11-12 ENCOUNTER — Telehealth (INDEPENDENT_AMBULATORY_CARE_PROVIDER_SITE_OTHER): Payer: Self-pay | Admitting: *Deleted

## 2009-12-11 ENCOUNTER — Ambulatory Visit: Payer: Self-pay | Admitting: Oncology

## 2009-12-13 ENCOUNTER — Encounter: Payer: Self-pay | Admitting: Family Medicine

## 2009-12-13 LAB — CBC WITH DIFFERENTIAL/PLATELET
Eosinophils Absolute: 0.1 10*3/uL (ref 0.0–0.5)
HCT: 41.8 % (ref 34.8–46.6)
LYMPH%: 28.6 % (ref 14.0–49.7)
MCHC: 34 g/dL (ref 31.5–36.0)
MCV: 99.1 fL (ref 79.5–101.0)
MONO#: 0.7 10*3/uL (ref 0.1–0.9)
MONO%: 10.7 % (ref 0.0–14.0)
NEUT#: 3.7 10*3/uL (ref 1.5–6.5)
NEUT%: 59 % (ref 38.4–76.8)
Platelets: 319 10*3/uL (ref 145–400)
WBC: 6.3 10*3/uL (ref 3.9–10.3)

## 2010-02-19 ENCOUNTER — Telehealth: Payer: Self-pay | Admitting: Family Medicine

## 2010-02-19 ENCOUNTER — Encounter (INDEPENDENT_AMBULATORY_CARE_PROVIDER_SITE_OTHER): Payer: Self-pay | Admitting: *Deleted

## 2010-02-19 ENCOUNTER — Telehealth (INDEPENDENT_AMBULATORY_CARE_PROVIDER_SITE_OTHER): Payer: Self-pay | Admitting: *Deleted

## 2010-05-22 ENCOUNTER — Encounter: Payer: Self-pay | Admitting: Family Medicine

## 2010-05-22 ENCOUNTER — Ambulatory Visit: Payer: Self-pay | Admitting: Family Medicine

## 2010-05-27 ENCOUNTER — Telehealth (INDEPENDENT_AMBULATORY_CARE_PROVIDER_SITE_OTHER): Payer: Self-pay | Admitting: *Deleted

## 2010-05-27 LAB — CONVERTED CEMR LAB
BUN: 17 mg/dL (ref 6–23)
Bilirubin, Direct: 0.1 mg/dL (ref 0.0–0.3)
CO2: 27 meq/L (ref 19–32)
Chloride: 101 meq/L (ref 96–112)
Creatinine, Ser: 1.2 mg/dL (ref 0.4–1.2)
Eosinophils Absolute: 0 10*3/uL (ref 0.0–0.7)
HDL: 57.6 mg/dL (ref >39.00)
MCHC: 33.5 g/dL (ref 30.0–36.0)
MCV: 97.8 fL (ref 78.0–100.0)
Monocytes Absolute: 0.5 10*3/uL (ref 0.1–1.0)
Neutrophils Relative %: 59.3 % (ref 43.0–77.0)
Platelets: 264 10*3/uL (ref 150.0–400.0)
Total Bilirubin: 0.6 mg/dL (ref 0.3–1.2)
Total CHOL/HDL Ratio: 3
Transferrin: 236.6 mg/dL (ref 212.0–360.0)
Vit D, 25-Hydroxy: 104 ng/mL — ABNORMAL HIGH (ref 30–89)
Vitamin B-12: 1468 pg/mL — ABNORMAL HIGH (ref 211–911)
WBC: 5.4 10*3/uL (ref 4.5–10.5)

## 2010-05-28 ENCOUNTER — Ambulatory Visit: Payer: Self-pay | Admitting: Family Medicine

## 2010-05-28 DIAGNOSIS — K921 Melena: Secondary | ICD-10-CM | POA: Insufficient documentation

## 2010-05-28 LAB — CONVERTED CEMR LAB: Fecal Occult Bld: POSITIVE

## 2010-05-29 ENCOUNTER — Encounter (INDEPENDENT_AMBULATORY_CARE_PROVIDER_SITE_OTHER): Payer: Self-pay | Admitting: *Deleted

## 2010-05-30 ENCOUNTER — Telehealth: Payer: Self-pay | Admitting: Family Medicine

## 2010-06-28 ENCOUNTER — Ambulatory Visit: Payer: Self-pay | Admitting: Family Medicine

## 2010-07-02 ENCOUNTER — Telehealth (INDEPENDENT_AMBULATORY_CARE_PROVIDER_SITE_OTHER): Payer: Self-pay | Admitting: *Deleted

## 2010-07-02 LAB — CONVERTED CEMR LAB
OCCULT 2: NEGATIVE
OCCULT 3: NEGATIVE

## 2010-07-12 ENCOUNTER — Encounter (INDEPENDENT_AMBULATORY_CARE_PROVIDER_SITE_OTHER): Payer: Self-pay | Admitting: *Deleted

## 2010-07-12 ENCOUNTER — Ambulatory Visit: Payer: Self-pay | Admitting: Gastroenterology

## 2010-07-15 ENCOUNTER — Telehealth: Payer: Self-pay | Admitting: Gastroenterology

## 2010-07-26 ENCOUNTER — Telehealth: Payer: Self-pay | Admitting: Family Medicine

## 2010-07-31 ENCOUNTER — Ambulatory Visit: Payer: Self-pay | Admitting: Oncology

## 2010-08-02 ENCOUNTER — Encounter: Payer: Self-pay | Admitting: Gastroenterology

## 2010-08-02 LAB — CBC WITH DIFFERENTIAL/PLATELET
Basophils Absolute: 0 10*3/uL (ref 0.0–0.1)
Eosinophils Absolute: 0 10*3/uL (ref 0.0–0.5)
HCT: 36.8 % (ref 34.8–46.6)
MCH: 33.3 pg (ref 25.1–34.0)
MCHC: 34.2 g/dL (ref 31.5–36.0)
MONO#: 0.6 10*3/uL (ref 0.1–0.9)
Platelets: 276 10*3/uL (ref 145–400)

## 2010-08-27 ENCOUNTER — Telehealth: Payer: Self-pay | Admitting: Family Medicine

## 2010-09-06 ENCOUNTER — Ambulatory Visit
Admission: RE | Admit: 2010-09-06 | Discharge: 2010-09-06 | Payer: Self-pay | Source: Home / Self Care | Attending: Gastroenterology | Admitting: Gastroenterology

## 2010-09-06 ENCOUNTER — Encounter: Payer: Self-pay | Admitting: Gastroenterology

## 2010-09-10 ENCOUNTER — Encounter: Payer: Self-pay | Admitting: Gastroenterology

## 2010-09-22 LAB — CONVERTED CEMR LAB
ALT: 20 units/L (ref 0–35)
ALT: 25 units/L (ref 0–35)
ALT: 45 units/L — ABNORMAL HIGH (ref 0–35)
AST: 37 units/L (ref 0–37)
AST: 39 units/L — ABNORMAL HIGH (ref 0–37)
AST: 44 units/L — ABNORMAL HIGH (ref 0–37)
Albumin: 3.6 g/dL (ref 3.5–5.2)
Albumin: 3.8 g/dL (ref 3.5–5.2)
Albumin: 4.1 g/dL (ref 3.5–5.2)
Albumin: 4.2 g/dL (ref 3.5–5.2)
Alkaline Phosphatase: 49 units/L (ref 39–117)
Alkaline Phosphatase: 58 units/L (ref 39–117)
BUN: 13 mg/dL (ref 6–23)
BUN: 14 mg/dL (ref 6–23)
BUN: 16 mg/dL (ref 6–23)
Basophils Absolute: 0 10*3/uL (ref 0.0–0.1)
Basophils Absolute: 0 10*3/uL (ref 0.0–0.1)
Basophils Absolute: 0 10*3/uL (ref 0.0–0.1)
Basophils Relative: 0.4 % (ref 0.0–1.0)
Basophils Relative: 1 % (ref 0.0–3.0)
Bilirubin, Direct: 0.1 mg/dL (ref 0.0–0.3)
Bilirubin, Direct: 0.1 mg/dL (ref 0.0–0.3)
CO2: 28 meq/L (ref 19–32)
CO2: 32 meq/L (ref 19–32)
Calcium, Total (PTH): 11.5 mg/dL — ABNORMAL HIGH (ref 8.4–10.5)
Calcium: 10.1 mg/dL (ref 8.4–10.5)
Calcium: 9.9 mg/dL (ref 8.4–10.5)
Chloride: 105 meq/L (ref 96–112)
Cholesterol: 136 mg/dL (ref 0–200)
Creatinine, Ser: 1.3 mg/dL — ABNORMAL HIGH (ref 0.4–1.2)
Creatinine, Ser: 1.4 mg/dL — ABNORMAL HIGH (ref 0.4–1.2)
Direct LDL: 165.1 mg/dL
Eosinophils Absolute: 0 10*3/uL (ref 0.0–0.7)
Eosinophils Absolute: 0.1 10*3/uL (ref 0.0–0.6)
Eosinophils Absolute: 0.1 10*3/uL (ref 0.0–0.7)
Eosinophils Relative: 0.6 % (ref 0.0–5.0)
Eosinophils Relative: 1.1 % (ref 0.0–5.0)
Folate: >20 ng/mL
GFR calc Af Amer: 47 mL/min
GFR calc non Af Amer: 39 mL/min
GFR calc non Af Amer: 42 mL/min
Glucose, Bld: 85 mg/dL (ref 70–99)
Glucose, Bld: 93 mg/dL (ref 70–99)
Glucose, Urine, Semiquant: NEGATIVE
HCT: 32.3 % — ABNORMAL LOW (ref 36.0–46.0)
HCT: 39.2 % (ref 36.0–46.0)
HCT: 41.6 % (ref 36.0–46.0)
HDL: 77.4 mg/dL (ref >39.0)
Hemoglobin: 11 g/dL — ABNORMAL LOW (ref 12.0–15.0)
Hemoglobin: 13.2 g/dL (ref 12.0–15.0)
Hemoglobin: 14.2 g/dL (ref 12.0–15.0)
Ketones, urine, test strip: NEGATIVE
Lymphocytes Relative: 29.3 % (ref 12.0–46.0)
Lymphs Abs: 1.4 10*3/uL (ref 0.7–4.0)
MCHC: 33.7 g/dL (ref 30.0–36.0)
MCHC: 34 g/dL (ref 30.0–36.0)
MCHC: 34.2 g/dL (ref 30.0–36.0)
MCV: 101.7 fL — ABNORMAL HIGH (ref 78.0–100.0)
MCV: 97.7 fL (ref 78.0–100.0)
MCV: 98.8 fL (ref 78.0–100.0)
Monocytes Absolute: 0.5 10*3/uL (ref 0.1–1.0)
Monocytes Absolute: 0.5 10*3/uL (ref 0.2–0.7)
Monocytes Relative: 9.3 % (ref 3.0–11.0)
Neutro Abs: 2.5 10*3/uL (ref 1.4–7.7)
Neutro Abs: 3.2 10*3/uL (ref 1.4–7.7)
Neutro Abs: 3.3 10*3/uL (ref 1.4–7.7)
Neutrophils Relative %: 59.9 % (ref 43.0–77.0)
Nitrite: NEGATIVE
PTH: 11.3 pg/mL — ABNORMAL LOW (ref 14.0–72.0)
Platelets: 235 10*3/uL (ref 150–400)
Potassium: 3.5 meq/L (ref 3.5–5.1)
Potassium: 3.6 meq/L (ref 3.5–5.1)
RBC: 3.31 M/uL — ABNORMAL LOW (ref 3.87–5.11)
RBC: 3.86 M/uL — ABNORMAL LOW (ref 3.87–5.11)
RDW: 11.5 % (ref 11.5–14.6)
RDW: 12.2 % (ref 11.5–14.6)
Sodium: 138 meq/L (ref 135–145)
Sodium: 144 meq/L (ref 135–145)
Specific Gravity, Urine: <1.005
TSH: 1.12 microintl units/mL (ref 0.35–5.50)
TSH: 2.12 microintl units/mL (ref 0.35–5.50)
TSH: 5.82 microintl units/mL — ABNORMAL HIGH (ref 0.35–5.50)
Total Bilirubin: 0.6 mg/dL (ref 0.3–1.2)
Total Bilirubin: 0.8 mg/dL (ref 0.3–1.2)
Total Bilirubin: 0.8 mg/dL (ref 0.3–1.2)
Total Protein: 6.3 g/dL (ref 6.0–8.3)
Total Protein: 6.8 g/dL (ref 6.0–8.3)
Vitamin B-12: 799 pg/mL (ref 211–911)
WBC: 5.4 10*3/uL (ref 4.5–10.5)
pH: 6

## 2010-09-24 NOTE — Progress Notes (Signed)
Summary: Refill Request  Phone Note Refill Request Message from:  Patient on [facility]  Refills Requested: Medication #1:  FUROSEMIDE 40 MG TABS take 1 tab once daily as needed   Dosage confirmed as above?Dosage Confirmed   Supply Requested: 3 months   Notes: 1 refill  Medication #2:  ZOCOR 80 MG TABS Take one tablet each evening at bedtime.   Dosage confirmed as above?Dosage Confirmed   Supply Requested: 3 months   Notes: 1 refill  Method Requested: Fax to [facility] Next Appointment Scheduled: none Initial call taken by: Harold Barban,  November 12, 2009 8:41 AM    Prescriptions: ZOCOR 80 MG TABS (SIMVASTATIN) Take one tablet each evening at bedtime.  #90 x 0   Entered by:   Army Fossa CMA   Authorized by:   Loreen Freud DO   Signed by:   Army Fossa CMA on 11/12/2009   Method used:   Electronically to        [facility]* (mail-order)       [address]       [place], [place]  [address]       Ph: [phone]       Fax: [phone]   RxID:   [id number] FUROSEMIDE 40 MG TABS (FUROSEMIDE) take 1 tab once daily as needed  #90 x 0   Entered by:   Army Fossa CMA   Authorized by:   Loreen Freud DO   Signed by:   Army Fossa CMA on 11/12/2009   Method used:   Electronically to        [facility]* (mail-order)       [address]       [place], [place]  [address]       Ph: [phone]       Fax: [phone]   RxID:   [id number]

## 2010-09-24 NOTE — Progress Notes (Signed)
Summary: REFILL REQUEST  Phone Note Refill Request Message from:  Patient on February 19, 2010 9:24 AM  Refills Requested: Medication #1:  ALPRAZOLAM 0.5 MG  TABS 1 TAB three times a day as needed   Dosage confirmed as above?Dosage Confirmed   Supply Requested: 3 months   Last Refilled: 11/12/2009  Medication #2:  TEMAZEPAM 30 MG  CAPS 1 by mouth at bedtime   Dosage confirmed as above?Dosage Confirmed   Supply Requested: 3 months   Last Refilled: 11/12/2009 [facility] [address] PT IS REQUESTING THAT THE ALPRAZOLAM IS CALLED IN WITH 270 TABS.  Next Appointment Scheduled: NONE Initial call taken by: Lavell Islam,  February 19, 2010 9:26 AM Caller: Patient  Follow-up for Phone Call        05-17-09 LAST ov,3-21-11Last filled ALPRAZOLAM #270, TEMAZEPAM #90...............Marland KitchenFelecia Deloach CMA  February 19, 2010 9:34 AM   Additional Follow-up for Phone Call Additional follow up Details #1::        ok for #270 and #90 Additional Follow-up by: Neena Rhymes MD,  February 19, 2010 9:37 AM    Prescriptions: ALPRAZOLAM 0.5 MG  TABS (ALPRAZOLAM) 1 TAB three times a day as needed  #270 x 0   Entered by:   Jeremy Johann CMA   Authorized by:   Neena Rhymes MD   Signed by:   Jeremy Johann CMA on 02/19/2010   Method used:   Printed then faxed to ...       [facility] [phone]* (retail)       [address] [place], [place]  [address]       Ph: [phone]       Fax: [phone]   RxID:   [id number] TEMAZEPAM 30 MG  CAPS (TEMAZEPAM) 1 by mouth at bedtime  #90 x 0   Entered by:   Jeremy Johann CMA   Authorized by:   Neena Rhymes MD   Signed by:   Jeremy Johann CMA on 02/19/2010   Method used:   Printed then faxed to ...       [facility] [phone]* (retail)       [address] [place], [place]  [address]       Ph: [phone]       Fax: [phone]   RxID:   [id number]

## 2010-09-24 NOTE — Progress Notes (Signed)
Summary: GI Consult Concerns  Phone Note Call from Patient Call back at [phone]   Caller: Arelia Longest  ~ Daughter Summary of Call: Patient's daughter called and said her mother would really prefer not to see a GI doctor. She knows that there was some blood in her stool but with her hemeglobin at 16 she thinks it is more inflammatory then blood loss. She would like to see about doing the stool cards in another week  before going through with the GI consult. Please advise.  Initial call taken by: Harold Barban,  May 30, 2010 8:41 AM  Follow-up for Phone Call        That is fine but they need to understand that even if stool cards are negative in a couple of weeks doesn't mean there is no bleeding --it just means there is none at that time.   Follow-up by: Loreen Freud DO,  May 30, 2010 11:49 AM  Additional Follow-up for Phone Call Additional follow up Details #1::        I spoke with pts daughter and she is aware.  Additional Follow-up by: Army Fossa CMA,  May 30, 2010 4:14 PM

## 2010-09-24 NOTE — Progress Notes (Signed)
Summary: Refill Request  Phone Note Refill Request Message from:  Patient on [facility]   Refills Requested: Medication #1:  ALPRAZOLAM 0.5 MG  TABS 1 TAB three times a day as needed   Dosage confirmed as above?Dosage Confirmed   Supply Requested: 3 months   Last Refilled: 08/13/2009   Notes: 1 refill  Medication #2:  TEMAZEPAM 30 MG  CAPS 1 by mouth at bedtime   Dosage confirmed as above?Dosage Confirmed   Supply Requested: 3 months   Last Refilled: 08/13/2009   Notes: 1 refill  Method Requested: Fax to Local Pharmacy Next Appointment Scheduled: none Initial call taken by: Harold Barban,  November 12, 2009 8:39 AM  Follow-up for Phone Call        last ov- 04/2009. Army Fossa CMA  November 12, 2009 9:00 AM   Additional Follow-up for Phone Call Additional follow up Details #1::        ok to refill x1 each Additional Follow-up by: Loreen Freud DO,  November 12, 2009 9:34 AM    Prescriptions: TEMAZEPAM 30 MG  CAPS (TEMAZEPAM) 1 by mouth at bedtime  #90 x 0   Entered by:   Army Fossa CMA   Authorized by:   Loreen Freud DO   Signed by:   Army Fossa CMA on 11/12/2009   Method used:   Printed then faxed to ...       [facility] [phone]* (retail)       [address] [place], [place]  [address]       Ph: [phone]       Fax: [phone]   RxID:   [id number] ALPRAZOLAM 0.5 MG  TABS (ALPRAZOLAM) 1 TAB three times a day as needed  #270 x 0   Entered by:   Army Fossa CMA   Authorized by:   Loreen Freud DO   Signed by:   Army Fossa CMA on 11/12/2009   Method used:   Printed then faxed to ...       [facility] [phone]* (retail)       [address] [place], [place]  [address]       Ph: [phone]       Fax: [phone]   RxID:   [id number]

## 2010-09-24 NOTE — Letter (Signed)
Summary: New Patient letter  [facility]  [address] [place], [place] [address]   Phone: [phone]  Fax: [phone]       05/29/2010 MRN: [id number]  [facility] [address] [place], [place]  [address]  Dear [name],  Welcome to the Gastroenterology Division at [facility].    You are scheduled to see Dr.  Christella Hartigan on 07-12-10 at 2:30p.m. on the 3rd floor at [facility], [address]  We ask that you try to arrive at our office 15 minutes prior to your appointment time to allow for check-in.  We would like you to complete the enclosed self-administered evaluation form prior to your visit and bring it with you on the day of your appointment.  We will review it with you.  Also, please bring a complete list of all your medications or, if you prefer, bring the medication bottles and we will list them.  Please bring your insurance card so that we may make a copy of it.  If your insurance requires a referral to see a specialist, please bring your referral form from your primary care physician.  Co-payments are due at the time of your visit and may be paid by cash, check or credit card.     Your office visit will consist of a consult with your physician (includes a physical exam), any laboratory testing he/she may order, scheduling of any necessary diagnostic testing (e.g. x-ray, ultrasound, CT-scan), and scheduling of a procedure (e.g. Endoscopy, Colonoscopy) if required.  Please allow enough time on your schedule to allow for any/all of these possibilities.    If you cannot keep your appointment, please call [phone] to cancel or reschedule prior to your appointment date.  This allows [place] the opportunity to schedule an appointment for another patient in need of care.  If you do not cancel or reschedule by 5 p.m. the business day prior to your appointment date, you will be charged a $50.00 late cancellation/no-show fee.    Thank you for choosing  [facility] for your medical needs.  We appreciate the opportunity to care for you.  Please visit [place] at our website  to learn more about our practice.                     Sincerely,                                                             The Gastroenterology Division

## 2010-09-24 NOTE — Progress Notes (Signed)
Summary: Results   Phone Note Outgoing Call   Call placed by: [name] CMA Duncan Dull),  July 02, 2010 1:08 PM Call placed to: Patient Details for Reason: + blood--- pt still needs to see GI   Summary of Call: Left message to call back.... [name] CMA Duncan Dull)  July 02, 2010 1:08 PM Pt aware will keep pending appt with GI..........Marland KitchenFelecia Deloach CMA  July 03, 2010 10:10 AM

## 2010-09-24 NOTE — Medication Information (Signed)
Summary: Letter Regarding Propoxyphene Product/[facility]  Letter Regarding Prop[facility] Product/[facility]   Imported By: Lanelle Bal 09/11/2009 14:30:39  _____________________________________________________________________  External Attachment:    Type:   Image     Comment:   External Document

## 2010-09-24 NOTE — Progress Notes (Signed)
Summary: REFILL REQUEST  Phone Note Refill Request Message from:  Patient on February 19, 2010 9:21 AM  Refills Requested: Medication #1:  SYNTHROID 25 MCG  TABS 1 by mouth once daily   Dosage confirmed as above?Dosage Confirmed   Supply Requested: 3 months  Medication #2:  FUROSEMIDE 40 MG TABS take 1 tab once daily as needed   Dosage confirmed as above?Dosage Confirmed   Supply Requested: 3 months  Medication #3:  ZOCOR 80 MG TABS Take one tablet each evening at bedtime.   Dosage confirmed as above?Dosage Confirmed   Supply Requested: 3 months [facility]  Next Appointment Scheduled: NONE Initial call taken by: Lavell Islam,  February 19, 2010 9:23 AM  Follow-up for Phone Call        LABS DUE 6 MONTH FROM 05-17-09, 272.4  hep, lipid.Marland KitchenMarland KitchenFelecia Deloach CMA  February 19, 2010 9:40 AM     New/Updated Medications: ZOCOR 80 MG TABS (SIMVASTATIN) Take one tablet each evening at bedtime**LABS DUE NOW** Prescriptions: PROMETHAZINE HCL 25 MG  TABS (PROMETHAZINE HCL) 1 by mouth bid as needed  #180 x 0   Entered by:   Jeremy Johann CMA   Authorized by:   Loreen Freud DO   Signed by:   Jeremy Johann CMA on 02/19/2010   Method used:   Printed then faxed to ...       [facility]* (mail-order)       [address]       [place], [place]  [address]       Ph: [phone]       Fax: [phone]   RxID:   [id number] FUROSEMIDE 40 MG TABS (FUROSEMIDE) take 1 tab once daily as needed  #90 x 0   Entered by:   Jeremy Johann CMA   Authorized by:   Loreen Freud DO   Signed by:   Jeremy Johann CMA on 02/19/2010   Method used:   Printed then faxed to ...       [facility]* (mail-order)       [address] [place], [place]  [address]       Ph: [phone]       Fax: [phone]   RxID:   [id number] SYNTHROID 25 MCG  TABS (LEVOTHYROXINE SODIUM) 1 by mouth once daily  #90 x 0   Entered by:   Jeremy Johann CMA   Authorized by:    Loreen Freud DO   Signed by:   Jeremy Johann CMA on 02/19/2010   Method used:   Printed then faxed to ...       [facility]* (mail-order)       [address]       [place], [place]  [address]       Ph: [phone]       Fax: [phone]   RxID:   [id number] ZOCOR 80 MG TABS (SIMVASTATIN) Take one tablet each evening at bedtime**LABS DUE NOW**  #90 x 0   Entered by:   Jeremy Johann CMA   Authorized by:   Loreen Freud DO   Signed by:   Jeremy Johann CMA on 02/19/2010   Method used:   Printed then faxed to ...       [facility]* (mail-order)       [address]       [place], [place]  [address]       Ph: [phone]  Fax: [phone]   RxID:   [id number]

## 2010-09-24 NOTE — Letter (Signed)
Summary: [facility] Instructions  [facility]  [address] [place], [place] [address]   Phone: [phone]  Fax: [phone]       [name]    Mar 07, 1928    MRN: [id number]        Procedure Day /Date:09/04/10 WED     Arrival Time:730 am     Procedure Time:830 am     Location of Procedure:                    X  [facility] (4th Floor)                        PREPARATION FOR COLONOSCOPY WITH MOVIPREP   Starting 5 days prior to your procedure 08/30/10 do not eat nuts, seeds, popcorn, corn, beans, peas,  salads, or any raw vegetables.  Do not take any fiber supplements (e.g. Metamucil, Citrucel, and Benefiber).  THE DAY BEFORE YOUR PROCEDURE         DATE: 09/03/10  DAY: TUE  1.  Drink clear liquids the entire day-NO SOLID FOOD  2.  Do not drink anything colored red or purple.  Avoid juices with pulp.  No orange juice.  3.  Drink at least 64 oz. (8 glasses) of fluid/clear liquids during the day to prevent dehydration and help the prep work efficiently.  CLEAR LIQUIDS INCLUDE: Water Jello Ice Popsicles Tea (sugar ok, no milk/cream) Powdered fruit flavored drinks Coffee (sugar ok, no milk/cream) Gatorade Juice: apple, white grape, white cranberry  Lemonade Clear bullion, consomm, broth Carbonated beverages (any kind) Strained chicken noodle soup Hard Candy                             4.  In the morning, mix first dose of MoviPrep solution:    Empty 1 Pouch A and 1 Pouch B into the disposable container    Add lukewarm drinking water to the top line of the container. Mix to dissolve    Refrigerate (mixed solution should be used within 24 hrs)  5.  Begin drinking the prep at 5:00 p.m. The MoviPrep container is divided by 4 marks.   Every 15 minutes drink the solution down to the next mark (approximately 8 oz) until the full liter is complete.   6.  Follow completed prep with 16 oz of clear liquid of your choice (Nothing red or purple).   Continue to drink clear liquids until bedtime.  7.  Before going to bed, mix second dose of MoviPrep solution:    Empty 1 Pouch A and 1 Pouch B into the disposable container    Add lukewarm drinking water to the top line of the container. Mix to dissolve    Refrigerate  THE DAY OF YOUR PROCEDURE      DATE: 09/04/10 DAY: WED  Beginning at 330 a.m. (5 hours before procedure):         1. Every 15 minutes, drink the solution down to the next mark (approx 8 oz) until the full liter is complete.  2. Follow completed prep with 16 oz. of clear liquid of your choice.    3. You may drink clear liquids until 630 am (2 HOURS BEFORE PROCEDURE).   MEDICATION INSTRUCTIONS  Unless otherwise instructed, you should take regular prescription medications with a small sip of water   as early as possible the morning of your procedure.  OTHER INSTRUCTIONS  You will need a responsible adult at least 75 years of age to accompany you and drive you home.   This person must remain in the waiting room during your procedure.  Wear loose fitting clothing that is easily removed.  Leave jewelry and other valuables at home.  However, you may wish to bring a book to read or  an iPod/MP3 player to listen to music as you wait for your procedure to start.  Remove all body piercing jewelry and leave at home.  Total time from sign-in until discharge is approximately 2-3 hours.  You should go home directly after your procedure and rest.  You can resume normal activities the  day after your procedure.  The day of your procedure you should not:   Drive   Make legal decisions   Operate machinery   Drink alcohol   Return to work  You will receive specific instructions about eating, activities and medications before you leave.    The above instructions have been reviewed and explained to me by   _______________________    I fully understand and can verbalize these instructions  _____________________________ Date _________

## 2010-09-24 NOTE — Progress Notes (Signed)
Summary: refuse APPT med request  Phone Note Call from Patient   Caller: Daughter Summary of Call: Pt daughter left VM that pt granddaughter was sick on last week for URI and now she has spread it to PT. PT daughter is requesting a Rx for pt. Called PT back PT refused to come in and states that she will just wait to see if it gets any better. Pls advise on med.................Marland KitchenFelecia Deloach CMA  July 26, 2010 11:03 AM   Follow-up for Phone Call        depending on her symptoms she can use coricidan, mucinex or plain antihistamine like zyrtec, claritin or allegra.   She needs to be seen if she thinks she needs abx.   Follow-up by: Loreen Freud DO,  July 26, 2010 11:46 AM  Additional Follow-up for Phone Call Additional follow up Details #1::        spk with patient and made her aware, she wrote down the names of the meds. I advise if she didn't get better she needs to come in for an appt. she says she was feeling a little better from this morning and declined an appt. She said she will try the mucinex and zyrtec. Call ended. Additional Follow-up by: [name] CMA Duncan Dull),  July 26, 2010 4:18 PM

## 2010-09-24 NOTE — Letter (Signed)
[facility]   Imported By: Lanelle Bal 12/28/2009 11:39:45  _____________________________________________________________________  External Attachment:    Type:   Image     Comment:   External Document

## 2010-09-24 NOTE — Letter (Signed)
Summary: Primary Care Appointment Letter  [facility]  [address] [place], [place] [address]   Phone: [phone]  Fax: [phone]    02/19/2010 MRN: [id number]  [facility] [address] [place], [place]  [address]  Dear Ms. Providence Lanius,   Your Primary Care Physician Loreen Freud DO has indicated that:    ___x____it is time to schedule an appointment.    _______you missed your appointment on______ and need to call and          reschedule.    _x______you need to have lab work done.    _______you need to schedule an appointment discuss lab or test results.    _______you need to call to reschedule your appointment that is                       scheduled on _________.     Please call our office as soon as possible. Our phone number is [phone]. Please press option 1. Our office is open 8a-12noon and 1p-5p, Monday through Friday.     Thank you,    [facility]

## 2010-09-24 NOTE — Assessment & Plan Note (Signed)
History of Present Illness Visit Type: Initial Consult Primary GI MD: Rob Bunting MD Primary Provider: Loreen Freud, DO Requesting Provider: Loreen Freud, DO Chief Complaint: Blood in stool  History of Present Illness:       very pleasant 77 year old woman who is here with her daughter today.  who did FOBT testing done twice, positive.  Hb was 14.  She never sees blood in stool.  She had colonoscopy in [place] 10 years ago, no polyps.  Her weight has been stable for many years.  She has been on iron for  a long time.  She is on procrit periodically.             Current Medications (verified): 1)  Adult Aspirin Low Strength 81 Mg  Tbdp (Aspirin) .Marland Kitchen.. 1 By Mouth Once Daily 2)  Alprazolam 0.5 Mg  Tabs (Alprazolam) .Marland Kitchen.. 1 Tab Three Times A Day As Needed 3)  Temazepam 30 Mg  Caps (Temazepam) .Marland Kitchen.. 1 By Mouth At Bedtime 4)  Folic Acid 10mg  .... 1 By Mouth Once Daily 5)  Reglan 10 Mg  Tabs (Metoclopramide Hcl) .Marland Kitchen.. 1 By Mouth Three Times A Day 6)  Tizanidine Hcl 2 Mg  Tabs (Tizanidine Hcl) .Marland Kitchen.. 1 By Mouth Three Times A Day 7)  Promethazine Hcl 25 Mg  Tabs (Promethazine Hcl) .Marland Kitchen.. 1 By Mouth Bid As Needed 8)  Requip 1 Mg  Tabs (Ropinirole Hcl) .Marland Kitchen.. 1 By Mouth Qhs 9)  Synthroid 25 Mcg  Tabs (Levothyroxine Sodium) .Marland Kitchen.. 1 By Mouth Once Daily 10)  Furosemide 40 Mg Tabs (Furosemide) .... Take 1 Tab Once Daily As Needed 11)  Zocor 80 Mg Tabs (Simvastatin) .... Take One Tablet Each Evening At Bedtime**labs Due Now** 12)  Walker [employer] .... Use With Walking At All Times. 13)  Vitamin D3 1000 Unit Caps (Cholecalciferol) .... 2 By Mouth Qd 14)  Zostavax 16109 Unt/0.19ml Solr (Zoster Vaccine Live) .Marland Kitchen.. 1 Ml Im X1 15)  Tramadol Hcl 50 Mg Tabs (Tramadol Hcl) .Marland Kitchen.. 1 By Mouth Every 6 Hours As Needed 16)  Ferretts 325 (106 Fe) Mg Tabs (Ferrous Fumarate) .... One Tablet By Mouth Once Daily  Allergies (verified): 1)  ! Codeine 2)  ! Valium 3)  ! Sulfa 4)  ! Biaxin  Past History:  Past  Medical History: Anxiety Depression Myocardial infarction, hx of Elevated cholesterol staph infection during hospital stay FX spine (3 compressed) Hyperlipidemia Anemia-NOS---- hospitalized 03/2009 Hypothyroidism    Past Surgical History: CABG Operative repair of obstructed ureter Hysterectomy with oopherectomy BMD Kyphoplasty Excision of basal cell carcinoma and reconstruction of tearduct--- 1999, 08/08/2009    Family History: Family History Breast cancer 1st degree relative <50 Family History Hypertension F- parkinsons  Family History of CAD Female 1st degree relative <60 Family History High cholesterol   Social History: Retired  Scientist, clinical (histocompatibility and immunogenetics), fabrics Former Smoker Alcohol use-no Drug use-no English as a second language teacher years/ widow 2 daughters - once deceased Lives alone with daughter near-by Regular exercise-yes    End-of-Life: patient has a Living Will and clearly states she does not want cardiac resuscitation, mechanical ventilation or other heroic or futile measures.   Review of Systems       Pertinent positive and negative review of systems were noted in the above HPI and GI specific review of systems.  All other review of systems was otherwise negative.   Vital Signs:  Patient profile:   77 year old female Height:      64 inches Weight:  93 pounds BMI:     16.02 BSA:     1.41 Pulse rate:   76 / minute Pulse rhythm:   regular BP sitting:   110 / 64  (left arm) Cuff size:   regular  Vitals Entered By: Ok Anis CMA (July 12, 2010 2:17 PM)  Physical Exam  Additional Exam:  Constitutional: petite, elderly woman Psychiatric: alert and oriented times 3 Eyes: extraocular movements intact Mouth: oropharynx moist, no lesions Neck: supple, no lymphadenopathy Cardiovascular: heart regular rate and rythm Lungs: CTA bilaterally Abdomen: soft, non-tender, non-distended, no obvious ascites, no peritoneal signs, normal bowel sounds Extremities: no lower  extremity edema bilaterally Skin: no lesions on visible extremities    Impression & Recommendations:  Problem # 1:  Hemoccult-positive stool we will proceed with colonoscopy at her soonest convenience. I see no reason for any further blood tests or imaging studies prior to then.  Patient Instructions: 1)  You will be scheduled to have a colonoscopy. 2)  The medication list was reviewed and reconciled.  All changed / newly prescribed medications were explained.  A complete medication list was provided to the patient / caregiver.  Appended Document: Orders Update/Movi    Clinical Lists Changes  Medications: Added new medication of MOVIPREP 100 GM  SOLR (PEG-KCL-NACL-NASULF-NA ASC-C) As per prep instructions. - Signed Rx of MOVIPREP 100 GM  SOLR (PEG-KCL-NACL-NASULF-NA ASC-C) As per prep instructions.;  #1 x 0;  Signed;  Entered by: Chales Abrahams CMA (AAMA);  Authorized by: Rachael Fee MD;  Method used: Electronically to [id number] [address] [place], [place], [place]  [address], Ph: [phone], Fax: [phone] Orders: Added new Test order of Colonoscopy (Colon) - Signed    Prescriptions: MOVIPREP 100 GM  SOLR (PEG-KCL-NACL-NASULF-NA ASC-C) As per prep instructions.  #1 x 0   Entered by:   Chales Abrahams CMA (AAMA)   Authorized by:   Rachael Fee MD   Signed by:   Chales Abrahams CMA (AAMA) on 07/12/2010   Method used:   Electronically to        [id number] (retail)       [address] [place], [place]  [address]       Ph: [phone]       Fax: [phone]   RxID:   [id number]

## 2010-09-24 NOTE — Letter (Signed)
Summary: [facility]: Immunoassay Fecal Occult Blood (iFOB) Order Form  [facility] [place]  [address] [place], [place] [address]   Phone: [phone]  Fax: [phone]      [facility]: Immunoassay Fecal Occult Blood (iFOB) Order Form   May 22, 2010 MRN: [id number]   [name] 06-Mar-1928   Physicican Name: Dr.Lowne  Diagnosis Code:  V56.71      [name] CMA (AAMA)

## 2010-09-24 NOTE — Progress Notes (Signed)
Summary: Unsure of her prep  Phone Note Call from Patient Call back at [facility] Phone [phone]   Call For: Dr Christella Hartigan Summary of Call: Changed her procedure from an am appt to a 4pm appt and now is unsure about the times to start taking her prep. Initial call taken by: Leanor Kail [facility],  July 15, 2010 9:33 AM  Follow-up for Phone Call        pt was instructed with her new time and date, she verbalized understanding and will call with any further questions Follow-up by: Chales Abrahams CMA Duncan Dull),  July 15, 2010 9:56 AM

## 2010-09-24 NOTE — Progress Notes (Signed)
Summary: 10-3---lab result  Phone Note Outgoing Call   Call placed by: Jeremy Johann CMA,  May 27, 2010 9:19 AM Details for Reason: make sure pt is only taking vita D 3 1000u daily Summary of Call: left message to call office..................Marland KitchenFelecia Deloach CMA  May 27, 2010 9:19 AM   Follow-up for Phone Call        spk with pt and adv ifob was positive for blood and we will refer her to the GI doctor, pt says she will call back when she hears from her daughter so she can schedule since her daughter dirves her. I also adv pt to take only 1000 International Units of Vit- D per day due to Vit-d levels being high, pt voiced understanding currently taking 2000 international units daily. She stated she will switch to 1000 instead.  Follow-up by: [name] CMA Duncan Dull),  May 29, 2010 1:57 PM

## 2010-09-24 NOTE — Assessment & Plan Note (Signed)
Summary: CPX/FASTING/KN   Vital Signs:  Patient profile:   77 year old female Height:      64 inches Weight:      91.6 pounds BMI:     15.78 Temp:     97.9 degrees F oral BP sitting:   104 / 68  (right arm) Cuff size:   regular  Vitals Entered By: [name] CMA Duncan Dull) (May 22, 2010 8:27 AM) CC: CPX/fasting  Does patient need assistance? Functional Status Self care, Cook/clean, Shopping, Social activities Ambulation Normal Comments Pt doesn't drive to [place] but she can go to grocery store close to home.  Her daughter takes her other places.   Pt is able to read and write  Vision Screening:      Vision Comments: + cataracts-- sees optho regularly 40db HL: Left  Right  Audiometry Comment: slightly HOH    History of Present Illness: Pt here for cpe and labs.  Pt fell August 30th--she tried to get on a moving treadmill and it threw her off. Broken ribs were suspected but xray didn't show anything.  Pt is wearing a rib support.    Preventive Screening-Counseling & Management  Alcohol-Tobacco     Alcohol drinks/day: 0     Smoking Status: quit     Year Quit: 1968     Pack years: 10     Passive Smoke Exposure: no  Caffeine-Diet-Exercise     Does Patient Exercise: yes     Type of exercise: walking stairs, floor exercise     Times/week: 6  Hep-HIV-STD-Contraception     HIV Risk: no     Dental Visit-last 6 months yes     Dental Care Counseling: not indicated; dental care within six months     SBE monthly: yes     SBE Education/Counseling: not indicated; SBE done regularly  Safety-Violence-Falls     Seat Belt Use: 100     Firearms in the Home: no firearms in the home     Smoke Detectors: no     Violence in the Home: no risk noted     Sexual Abuse: no     Fall Risk: NO      Sexual History:  widow.    Current Medications (verified): 1)  Adult Aspirin Low Strength 81 Mg  Tbdp (Aspirin) .Marland Kitchen.. 1 By Mouth Once Daily 2)  Alprazolam 0.5 Mg  Tabs (Alprazolam)  .Marland Kitchen.. 1 Tab Three Times A Day As Needed 3)  Temazepam 30 Mg  Caps (Temazepam) .Marland Kitchen.. 1 By Mouth At Bedtime 4)  Folic Acid 10mg  .... 1 By Mouth Once Daily 5)  Reglan 10 Mg  Tabs (Metoclopramide Hcl) .Marland Kitchen.. 1 By Mouth Three Times A Day 6)  Tizanidine Hcl 2 Mg  Tabs (Tizanidine Hcl) .Marland Kitchen.. 1 By Mouth Three Times A Day 7)  Promethazine Hcl 25 Mg  Tabs (Promethazine Hcl) .Marland Kitchen.. 1 By Mouth Bid As Needed 8)  Requip 1 Mg  Tabs (Ropinirole Hcl) .Marland Kitchen.. 1 By Mouth Qhs 9)  Synthroid 25 Mcg  Tabs (Levothyroxine Sodium) .Marland Kitchen.. 1 By Mouth Once Daily 10)  Furosemide 40 Mg Tabs (Furosemide) .... Take 1 Tab Once Daily As Needed 11)  Zocor 80 Mg Tabs (Simvastatin) .... Take One Tablet Each Evening At Bedtime**labs Due Now** 12)  Walker With [employer] .... Use With Walking At All Times. 13)  Vitamin D3 1000 Unit Caps (Cholecalciferol) .... 2 By Mouth Qd  Allergies (verified): 1)  ! Codeine 2)  ! Valium 3)  ! Sulfa 4)  !  Biaxin  Past History:  Past Medical History: Last updated: 05/17/2009 Anxiety Depression Myocardial infarction, hx of Elevated cholesterol staph infection during hospital stay FX spine (3 compressed) Hyperlipidemia Anemia-NOS---- hospitalized 03/2009 Hypothyroidism  Family History: Last updated: 04/12/2007 Family History Breast cancer 1st degree relative <50 Family History Hypertension F- parkinsons  Family History of CAD Female 1st degree relative <60 Family History High cholesterol  Social History: Last updated: 04/22/2009 Retired  Scientist, clinical (histocompatibility and immunogenetics), fabrics Former Smoker Alcohol use-no Drug use-no 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   Risk Factors: Alcohol Use: 0 (05/22/2010) Exercise: yes (05/22/2010)  Risk Factors: Smoking Status: quit  (05/22/2010) Passive Smoke Exposure: no (05/22/2010)  Past Surgical History: CABG Operative repair of obstructed ureter Hysterectomy with oopherectomy BMD Kyphoplasty Excision of basal cell carcinoma and reconstruction of tearduct--- 1999, 08/08/2009  Family History: Reviewed history from 04/12/2007 and no changes required. Family History Breast cancer 1st degree relative <50 Family History Hypertension F- parkinsons  Family History of CAD Female 1st degree relative <60 Family History High cholesterol  Social History: Reviewed history from 04/22/2009 and no changes required. 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  Fall Risk:  NO Dental Care w/in 6 mos.:  yes Sexual History:  widow  Review of Systems      See HPI General:  Denies chills, fatigue, fever, loss of appetite, malaise, sleep disorder, sweats, weakness, and weight loss. Eyes:  Complains of blurring; denies discharge, double vision, eye irritation, eye pain, halos, itching, light sensitivity, red eye, vision loss-1 eye, and vision loss-both eyes. ENT:  Denies decreased hearing, difficulty swallowing, ear discharge, earache, hoarseness, nasal congestion, nosebleeds, postnasal drainage, ringing in ears, sinus pressure, and sore throat. CV:  Denies bluish discoloration of lips or nails, chest pain or discomfort, difficulty breathing at night, difficulty breathing while lying down, fainting, fatigue, leg cramps with exertion, lightheadness, near fainting, palpitations, shortness of breath with exertion, swelling of feet, swelling of hands, and weight gain. Resp:  Denies chest discomfort, chest pain with inspiration,  cough, coughing up blood, excessive snoring, hypersomnolence, morning headaches, pleuritic, shortness of breath, sputum productive, and wheezing. GI:  Denies abdominal pain, bloody stools, change in bowel habits, constipation, dark tarry stools, diarrhea, excessive appetite, gas, hemorrhoids, indigestion, loss of appetite, nausea, vomiting, vomiting blood, and yellowish skin color. GU:  Denies abnormal vaginal bleeding, decreased libido, discharge, dysuria, genital sores, hematuria, incontinence, nocturia, urinary frequency, and urinary hesitancy. MS:  Complains of thoracic pain; denies joint pain, joint redness, joint swelling, loss of strength, low back pain, mid back pain, muscle aches, muscle, cramps, muscle weakness, and stiffness; suspected broken ribs. Derm:  Denies changes in color of skin, changes in nail beds, dryness, excessive perspiration, flushing, hair loss, insect bite(s), itching, lesion(s), poor wound healing, and rash. Neuro:  Denies brief paralysis, difficulty with concentration, disturbances in coordination, falling down, headaches, inability to speak, memory loss, numbness, poor balance, seizures, sensation of room spinning, tingling, tremors, visual disturbances, and weakness. Psych:  Denies alternate hallucination ( auditory/visual), anxiety, depression, easily angered, easily tearful, irritability, mental problems, panic attacks, sense  of great danger, suicidal thoughts/plans, thoughts of violence, unusual visions or sounds, and thoughts /plans of harming others. Endo:  Denies cold intolerance, excessive hunger, excessive thirst, excessive urination, heat intolerance, polyuria, and weight change. Heme:  Denies abnormal bruising, bleeding, enlarge lymph nodes, fevers, pallor, and skin discoloration. Allergy:  Denies hives or rash, itching eyes, persistent infections, seasonal allergies, and sneezing.  Physical Exam  General:  Well-developed,well-nourished,in no acute distress;  alert,appropriate and cooperative throughout examination Head:  Normocephalic and atraumatic without obvious abnormalities. No apparent alopecia or balding. Eyes:  pupils equal, pupils round, pupils reactive to light, and no injection.   Ears:  External ear exam shows no significant lesions or deformities.  Otoscopic examination reveals clear canals, tympanic membranes are intact bilaterally without bulging, retraction, inflammation or discharge. Hearing is decreased--+ b/l hearing aids Nose:  External nasal examination shows no deformity or inflammation. Nasal mucosa are pink and moist without lesions or exudates. Mouth:  Oral mucosa and oropharynx without lesions or exudates.  Teeth in good repair. Neck:  No deformities, masses, or tenderness noted.no carotid bruits.   Chest Wall:  No deformities, masses, or tenderness noted. Breasts:  No mass, nodules, thickening, tenderness, bulging, retraction, inflamation, nipple discharge or skin changes noted.   Lungs:  Normal respiratory effort, chest expands symmetrically. Lungs are clear to auscultation, no crackles or wheezes. Heart:  normal rate and no murmur.   Abdomen:  Bowel sounds positive,abdomen soft and non-tender without masses, organomegaly or hernias noted. Msk:  normal ROM, no joint tenderness, no joint swelling, no joint warmth, no redness over joints, no joint deformities, no joint instability, and no crepitation.   Pulses:  R and L carotid,radial,femoral,dorsalis pedis and posterior tibial pulses are full and equal bilaterally Extremities:  No clubbing, cyanosis, edema, or deformity noted with normal full range of motion of all joints.   Neurologic:  No cranial nerve deficits noted. Station and gait are normal. Plantar reflexes are down-going bilaterally. DTRs are symmetrical throughout. Sensory, motor and coordinative functions appear intact. Skin:  Intact without suspicious lesions or rashes Cervical Nodes:  No lymphadenopathy  noted Axillary Nodes:  No palpable lymphadenopathy Psych:  Cognition and judgment appear intact. Alert and cooperative with normal attention span and concentration. No apparent delusions, illusions, hallucinations   Impression & Recommendations:  Problem # 1:  PREVENTIVE HEALTH CARE (ICD-V70.0) GHM utd Orders: Venipuncture (36644) TLB-Lipid Panel (80061-LIPID) TLB-B12 + Folate Pnl (03474_25956-L87/FIE) TLB-IBC Pnl (Iron/FE;Transferrin) (83550-IBC) TLB-BMP (Basic Metabolic Panel-BMET) (80048-METABOL) TLB-CBC Platelet - w/Differential (85025-CBCD) TLB-Hepatic/Liver Function Pnl (80076-HEPATIC) TLB-TSH (Thyroid Stimulating Hormone) (84443-TSH) T-Vitamin D (25-Hydroxy) [id number]) Medicare -1st Annual Wellness Visit [phone]) EKG w/ Interpretation (93000)  Problem # 2:  HYPOTHYROIDISM (ICD-244.9)  Her updated medication list for this problem includes:    Synthroid 25 Mcg Tabs (Levothyroxine sodium) .Marland Kitchen... 1 by mouth once daily  Orders: Venipuncture (01601) TLB-Lipid Panel (80061-LIPID) TLB-B12 + Folate Pnl (09323_55732-K02/RKY) TLB-IBC Pnl (Iron/FE;Transferrin) (83550-IBC) TLB-BMP (Basic Metabolic Panel-BMET) (80048-METABOL) TLB-CBC Platelet - w/Differential (85025-CBCD) TLB-Hepatic/Liver Function Pnl (80076-HEPATIC) TLB-TSH (Thyroid Stimulating Hormone) (84443-TSH) T-Vitamin D (25-Hydroxy) (70623-76283)  Labs Reviewed: TSH: 2.12 (05/17/2009)    Chol: 136 (05/17/2009)   HDL: 53.40 (05/17/2009)   LDL: 62 (05/17/2009)   TG: 104.0 (05/17/2009)  Problem # 3:  ANEMIA-NOS (ICD-285.9)  Orders: Venipuncture (15176) TLB-Lipid Panel (80061-LIPID) TLB-B12 + Folate Pnl (16073_71062-I94/WNI) TLB-IBC Pnl (Iron/FE;Transferrin) (83550-IBC) TLB-BMP (Basic Metabolic Panel-BMET) (80048-METABOL) TLB-CBC Platelet - w/Differential (85025-CBCD) TLB-Hepatic/Liver Function Pnl (80076-HEPATIC) TLB-TSH (Thyroid Stimulating Hormone) (84443-TSH) T-Vitamin D (25-Hydroxy) (62703-50093)  Hgb: 14.2  (  05/17/2009)   Hct: 41.6 (05/17/2009)   Platelets: 252.0 (05/17/2009) RBC: 4.21 (05/17/2009)   RDW: 11.5 (05/17/2009)   WBC: 5.3 (05/17/2009) MCV: 98.8 (05/17/2009)   MCHC: 34.2 (05/17/2009) Ferritin: 33.2 (05/06/2007) Iron: 111 (05/06/2007)   B12: 799 (05/17/2009)   Folate: >20.0 ng/mL (05/17/2009)   TSH: 2.12 (05/17/2009)  Problem # 4:  HYPERLIPIDEMIA (ICD-272.4)  Her updated medication list for this problem includes:    Zocor 80 Mg Tabs (Simvastatin) .Marland Kitchen... Take one tablet each evening at bedtime**labs due now**  Orders: Venipuncture (78295) TLB-Lipid Panel (80061-LIPID) TLB-B12 + Folate Pnl (62130_86578-I69/GEX) TLB-IBC Pnl (Iron/FE;Transferrin) (83550-IBC) TLB-BMP (Basic Metabolic Panel-BMET) (80048-METABOL) TLB-CBC Platelet - w/Differential (85025-CBCD) TLB-Hepatic/Liver Function Pnl (80076-HEPATIC) TLB-TSH (Thyroid Stimulating Hormone) (84443-TSH) T-Vitamin D (25-Hydroxy) (52841-32440)  Labs Reviewed: SGOT: 39 (05/17/2009)   SGPT: 34 (05/17/2009)   HDL:53.40 (05/17/2009), 67.4 (08/16/2008)  LDL:62 (05/17/2009), 79 (08/16/2008)  Chol:136 (05/17/2009), 167 (08/16/2008)  Trig:104.0 (05/17/2009), 101 (08/16/2008)  Problem # 5:  MYOCARDIAL INFARCTION, HX OF (ICD-412)  Her updated medication list for this problem includes:    Adult Aspirin Low Strength 81 Mg Tbdp (Aspirin) .Marland Kitchen... 1 by mouth once daily    Furosemide 40 Mg Tabs (Furosemide) .Marland Kitchen... Take 1 tab once daily as needed  Labs Reviewed: Chol: 136 (05/17/2009)   HDL: 53.40 (05/17/2009)   LDL: 62 (05/17/2009)   TG: 104.0 (05/17/2009)  Complete Medication List: 1)  Adult Aspirin Low Strength 81 Mg Tbdp (Aspirin) .Marland Kitchen.. 1 by mouth once daily 2)  Alprazolam 0.5 Mg Tabs (Alprazolam) .Marland Kitchen.. 1 tab three times a day as needed 3)  Temazepam 30 Mg Caps (Temazepam) .Marland Kitchen.. 1 by mouth at bedtime 4)  Folic Acid 10mg   .... 1 by mouth once daily 5)  Reglan 10 Mg Tabs (Metoclopramide hcl) .Marland Kitchen.. 1 by mouth three times a day 6)  Tizanidine Hcl 2 Mg  Tabs (Tizanidine hcl) .Marland Kitchen.. 1 by mouth three times a day 7)  Promethazine Hcl 25 Mg Tabs (Promethazine hcl) .Marland Kitchen.. 1 by mouth bid as needed 8)  Requip 1 Mg Tabs (Ropinirole hcl) .Marland Kitchen.. 1 by mouth qhs 9)  Synthroid 25 Mcg Tabs (Levothyroxine sodium) .Marland Kitchen.. 1 by mouth once daily 10)  Furosemide 40 Mg Tabs (Furosemide) .... Take 1 tab once daily as needed 11)  Zocor 80 Mg Tabs (Simvastatin) .... Take one tablet each evening at bedtime**labs due now** 12)  Walker With [employer]  .... Use with walking at all times. 13)  Vitamin D3 1000 Unit Caps (Cholecalciferol) .... 2 by mouth qd 14)  Zostavax 10272 Unt/0.29ml Solr (Zoster vaccine live) .Marland Kitchen.. 1 ml im x1 15)  Tramadol Hcl 50 Mg Tabs (Tramadol hcl) .Marland Kitchen.. 1 by mouth every 6 hours as needed  Other Orders: Flu Vaccine 31yrs + MEDICARE PATIENTS (Z3664) Administration Flu vaccine - MCR (Q0347) Flu Vaccine Consent Questions     Do you have a history of severe allergic reactions to this vaccine? no    Any prior history of allergic reactions to egg and/or gelatin? no    Do you have a sensitivity to the preservative Thimersol? no    Do you have a past history of Guillan-Barre Syndrome? no    Do you currently have an acute febrile illness? no    Have you ever had a severe reaction to latex? no    Vaccine information given and explained to patient? yes    Are you currently pregnant? no    Lot Number:[id number]   Exp Date:02/22/2011   Site Given  Left Deltoid IM Flu Vaccine  55yrs + MEDICARE PATIENTS [phone]) Administration Flu vaccine - MCR (U0454) Prescriptions: TRAMADOL HCL 50 MG TABS (TRAMADOL HCL) 1 by mouth every 6 hours as needed  #270 x 1   Entered and Authorized by:   Loreen Freud DO   Signed by:   Loreen Freud DO on 05/22/2010   Method used:   Electronically to        [facility]* (mail-order)       [address]       [place], [place]  [address]       Ph: [phone]       Fax: [phone]   RxID:   [id number] ZOCOR 80 MG TABS  (SIMVASTATIN) Take one tablet each evening at bedtime**LABS DUE NOW**  #90 x 3   Entered and Authorized by:   Loreen Freud DO   Signed by:   Loreen Freud DO on 05/22/2010   Method used:   Electronically to        [facility]* (mail-order)       [address]       [place], [place]  [address]       Ph: [phone]       Fax: [phone]   RxID:   [id number] FUROSEMIDE 40 MG TABS (FUROSEMIDE) take 1 tab once daily as needed  #90 x 3   Entered and Authorized by:   Loreen Freud DO   Signed by:   Loreen Freud DO on 05/22/2010   Method used:   Electronically to        [facility]* (mail-order)       [address]       [place], [place]  [address]       Ph: [phone]       Fax: [phone]   RxID:   [id number] SYNTHROID 25 MCG  TABS (LEVOTHYROXINE SODIUM) 1 by mouth once daily  #90 x 3   Entered and Authorized by:   Loreen Freud DO   Signed by:   Loreen Freud DO on 05/22/2010   Method used:   Electronically to        [facility]* (mail-order)       [address]       [place], [place]  [address]       Ph: [phone]       Fax: [phone]   RxID:   [id number] REQUIP 1 MG  TABS (ROPINIROLE HCL) 1 by mouth qhs  #90 x 3   Entered and Authorized by:   Loreen Freud DO   Signed by:   Loreen Freud DO on 05/22/2010   Method used:   Electronically to        [facility]* (mail-order)       [address]       [place], [place]  [address]       Ph: [phone]       Fax: [phone]   RxID:   [id number] PROMETHAZINE HCL 25 MG  TABS (PROMETHAZINE HCL) 1 by mouth bid as needed  #180 x 3   Entered and Authorized by:   Loreen Freud DO   Signed by:   Loreen Freud DO on 05/22/2010   Method used:   Electronically to        [facility]* (mail-order)       [address] [place]  [address]  Ph: [phone]       Fax: [phone]   RxID:    [id number] TIZANIDINE HCL 2 MG  TABS (TIZANIDINE HCL) 1 by mouth three times a day  #270 x 3   Entered and Authorized by:   Loreen Freud DO   Signed by:   Loreen Freud DO on 05/22/2010   Method used:   Electronically to        [facility]* (mail-order)       [address]       [place], [place]  [address]       Ph: [phone]       Fax: [phone]   RxID:   [id number] TEMAZEPAM 30 MG  CAPS (TEMAZEPAM) 1 by mouth at bedtime  #90 x 0   Entered and Authorized by:   Loreen Freud DO   Signed by:   Loreen Freud DO on 05/22/2010   Method used:   Print then Give to Patient   RxID:   [id number] ALPRAZOLAM 0.5 MG  TABS (ALPRAZOLAM) 1 TAB three times a day as needed  #270 x 0   Entered and Authorized by:   Loreen Freud DO   Signed by:   Loreen Freud DO on 05/22/2010   Method used:   Print then Give to Patient   RxID:   [id number] RKYHCWCB 19400 UNT/0.65ML SOLR (ZOSTER VACCINE LIVE) 1 ml IM x1  #1 x 0   Entered and Authorized by:   Loreen Freud DO   Signed by:   Loreen Freud DO on 05/22/2010   Method used:   Print then Give to Patient   RxID:   [id number]  .lbmedflu     Mammogram Result Date:  09/27/2008 Mammogram Result:  normal Mammogram Next Due:  1 yr Bone Density Result Date:  09/27/2008 Bone Density Result:  osteopenia Bone Density Next Due: 2 yr  Appended Document: CPX/FASTING/KN  Laboratory Results   Urine Tests   Date/Time Reported: May 22, 2010 9:45 AM   Routine Urinalysis   Color: yellow Appearance: Clear Glucose: negative   (Normal Range: Negative) Bilirubin: negative   (Normal Range: Negative) Ketone: negative   (Normal Range: Negative) Spec. Gravity: 1.020   (Normal Range: 1.003-1.035) Blood: negative   (Normal Range: Negative) pH: 5.0   (Normal Range: 5.0-8.0) Protein: negative   (Normal Range: Negative) Urobilinogen: negative   (Normal Range: 0-1) Nitrite: negative   (Normal Range:  Negative) Leukocyte Esterace: negative   (Normal Range: Negative)    Comments: Floydene Flock  May 22, 2010 9:45 AM

## 2010-09-26 NOTE — Letter (Signed)
Summary: Results Letter  [facility]  [address] [place], [place] [address]   Phone: [phone]  Fax: [phone]        September 10, 2010 MRN: [id number]    [facility] [address] [place], [place]  [address]    Dear Ms. Buist,   The polyp removed during your recent procedure was proven to be adenomatous.  Usually, routine screening/surveillance colonoscopy would be recommended to be done in 3 years.  However since colon cancer screening tests generally stop around age 70, I will leave it to you and your primary care physician to contact my office at that time if it is felt that colon cancer screening is still an important issue for you.   Please call if you have any questions or concerns.       Sincerely,  Rachael Fee MD  This letter has been electronically signed by your physician.  Appended Document: Results Letter Letter mailed

## 2010-09-26 NOTE — Procedures (Signed)
Summary: Colonoscopy  Patient: [name] Note: All result statuses are Final unless otherwise noted.  Tests: (1) Colonoscopy (COL)   COL Colonoscopy           DONE     [facility]     [address]     [place], [place]  [address]           COLONOSCOPY PROCEDURE REPORT           PATIENT:  [name], [name]  MR#:  [id number]     BIRTHDATE:  [birth year], 82 yrs. old  GENDER:  female     ENDOSCOPIST:  Rachael Fee, MD     REF. BY:  Loreen Freud, DO     PROCEDURE DATE:  09/06/2010     PROCEDURE:  Colonoscopy with snare polypectomy     ASA CLASS:  Class II     INDICATIONS:  FOBT positive stool     MEDICATIONS:   Fentanyl 50 mcg IV, Versed 6 mg IV           DESCRIPTION OF PROCEDURE:   After the risks benefits and     alternatives of the procedure were thoroughly explained, informed     consent was obtained.  Digital rectal exam was performed and     revealed no rectal masses.   The [id number] endoscope was     introduced through the anus and advanced to the cecum, which was     identified by both the appendix and ileocecal valve, without     limitations.  The quality of the prep was good, using MoviPrep.     The instrument was then slowly withdrawn as the colon was fully     examined.     <<PROCEDUREIMAGES>>           FINDINGS:  A sessile polyp was found in the proximal transverse     colon. This was 14mm across, removed with cold snare in a     piecemeal fashion (see image4).  This was otherwise a normal     examination of the colon (see image2, image1, and image5).     Retroflexed views in the rectum revealed no abnormalities.    The     scope was then withdrawn from the patient and the procedure     completed.           COMPLICATIONS:  None     ENDOSCOPIC IMPRESSION:     1) Sessile polyp in the proximal transverse colon, removed and     sent to pathology     2) Otherwise normal examination           RECOMMENDATIONS:     1) Given your age, you will probably  not need another     colonoscopy for colon cancer screening or polyp surveillance.     These types of tests usually stop around the age 57.     2) You will receive a letter within 1-2 weeks with the results     of your biopsy as well as final recommendations. Please call my     office if you have not received a letter after 3 weeks.           ______________________________     Rachael Fee, MD           n.     eSIGNED:   Rachael Fee at 09/06/2010 04:08 PM  [name], [name], [phone]  Note: An exclamation mark (!) indicates a result that was not dispersed into the flowsheet. Document Creation Date: 09/06/2010 4:08 PM _______________________________________________________________________  (1) Order result status: Final Collection or observation date-time: 09/06/2010 16:03 Requested date-time:  Receipt date-time:  Reported date-time:  Referring Physician:   Ordering Physician: Rob Bunting [phone]) Specimen Source:  Source: [name] Order Number: [id number] Lab site:

## 2010-09-26 NOTE — Letter (Signed)
Summary: [facility]  [facility]   Imported By: Sherian Rein 08/20/2010 15:52:18  _____________________________________________________________________  External Attachment:    Type:   Image     Comment:   External Document

## 2010-09-26 NOTE — Progress Notes (Signed)
Summary: RX  Phone Note Refill Request Call back at Home Phone [phone] Message from:  Patient on August 27, 2010 2:28 PM  Refills Requested: Medication #1:  ALPRAZOLAM 0.5 MG  TABS 1 TAB three times a day as needed   Dosage confirmed as above?Dosage Confirmed   Supply Requested: 3 months  Medication #2:  TEMAZEPAM 30 MG  CAPS 1 by mouth at bedtime   Supply Requested: 3 months [facility]  Initial call taken by: Freddy Jaksch,  August 27, 2010 2:28 PM  Follow-up for Phone Call        last seen and filled 05/22/10.Marland KitchenMarland Kitchenplease advise Follow-up by: [name] CMA Duncan Dull),  August 27, 2010 4:41 PM  Additional Follow-up for Phone Call Additional follow up Details #1::        ok to refill x1 each Additional Follow-up by: Loreen Freud DO,  August 27, 2010 4:42 PM    Prescriptions: TEMAZEPAM 30 MG  CAPS (TEMAZEPAM) 1 by mouth at bedtime  #90 x 0   Entered by:   [name] CMA (AAMA)   Authorized by:   Loreen Freud DO   Signed by:   [name] CMA (AAMA) on 08/27/2010   Method used:   Printed then faxed to ...       [facility] [phone]* (retail)       [address] [place], [place]  [address]       Ph: [phone]       Fax: [phone]   RxID:   [id number] ALPRAZOLAM 0.5 MG  TABS (ALPRAZOLAM) 1 TAB three times a day as needed  #270 x 0   Entered by:   [name] CMA (AAMA)   Authorized by:   Loreen Freud DO   Signed by:   [name] CMA (AAMA) on 08/27/2010   Method used:   Printed then faxed to ...       [facility] [phone]* (retail)       [address] [place], [place]  [address]       Ph: [phone]       Fax: [phone]   RxID:   [id number]

## 2010-09-30 ENCOUNTER — Telehealth: Payer: Self-pay | Admitting: Family Medicine

## 2010-10-10 ENCOUNTER — Encounter: Payer: Self-pay | Admitting: Family Medicine

## 2010-10-10 NOTE — Progress Notes (Signed)
Summary: Questions about meds  Phone Note Refill Request   Refills Requested: Medication #1:  TRAMADOL HCL 50 MG TABS 1 by mouth every 6 hours as needed   Last Refilled: 05/22/2010 Initial call taken by: [name] CMA Duncan Dull),  September 30, 2010 2:35 PM Caller: Patient Call For: Loreen Freud DO Summary of Call: Mssg from patient and it stated she had questions about her medications, c/b # [phone].... [name] CMA Duncan Dull)  September 30, 2010 8:59 AM   Tried calling patient but the line was busy..... [name] CMA Duncan Dull)  September 30, 2010 9:00 AM  spoke with patient and she stated she needed an Rx Tramadol [facility] wants 270 pills with 1 refill...please advise Initial call taken by: [name] CMA Duncan Dull),  September 30, 2010 2:34 PM  Follow-up for Phone Call        sent to pharmacy Follow-up by: Loreen Freud DO,  October 01, 2010 10:14 AM  Additional Follow-up for Phone Call Additional follow up Details #1::        pt aware.....  Additional Follow-up by: [name] CMA Duncan Dull),  October 01, 2010 11:45 AM    Prescriptions: TRAMADOL HCL 50 MG TABS (TRAMADOL HCL) 1 by mouth every 6 hours as needed  #270 x 1   Entered and Authorized by:   Loreen Freud DO   Signed by:   Loreen Freud DO on 10/01/2010   Method used:   Electronically to        [facility]* (mail-order)       [address]       [place], [place]  [address]       Ph: [phone]       Fax: [phone]   RxID:   [id number]

## 2010-11-26 LAB — BASIC METABOLIC PANEL
CO2: 26 mEq/L (ref 19–32)
Chloride: 102 mEq/L (ref 96–112)
GFR calc Af Amer: 50 mL/min — ABNORMAL LOW (ref >60)
Glucose, Bld: 123 mg/dL — ABNORMAL HIGH (ref 70–99)
Potassium: 3.3 mEq/L — ABNORMAL LOW (ref 3.5–5.1)
Sodium: 136 mEq/L (ref 135–145)

## 2010-11-30 LAB — T4, FREE: Free T4: 0.98 ng/dL (ref 0.80–1.80)

## 2010-11-30 LAB — URINALYSIS, ROUTINE W REFLEX MICROSCOPIC
Protein, ur: 30 mg/dL — AB
Urobilinogen, UA: 0.2 mg/dL (ref 0.0–1.0)

## 2010-11-30 LAB — BASIC METABOLIC PANEL
BUN: 16 mg/dL (ref 6–23)
BUN: 8 mg/dL (ref 6–23)
CO2: 25 mEq/L (ref 19–32)
Calcium: 7.9 mg/dL — ABNORMAL LOW (ref 8.4–10.5)
Calcium: 8.4 mg/dL (ref 8.4–10.5)
Creatinine, Ser: 0.79 mg/dL (ref 0.4–1.2)
Creatinine, Ser: 1.1 mg/dL (ref 0.4–1.2)
GFR calc Af Amer: >60 mL/min (ref >60)
GFR calc non Af Amer: 48 mL/min — ABNORMAL LOW (ref >60)
Glucose, Bld: 89 mg/dL (ref 70–99)

## 2010-11-30 LAB — CBC
MCHC: 33.9 g/dL (ref 30.0–36.0)
MCV: 99.3 fL (ref 78.0–100.0)
Platelets: 160 10*3/uL (ref 150–400)
Platelets: 179 10*3/uL (ref 150–400)
RDW: 12.3 % (ref 11.5–15.5)
WBC: 5 10*3/uL (ref 4.0–10.5)

## 2010-11-30 LAB — URINE CULTURE
Colony Count: NO GROWTH
Culture: NO GROWTH

## 2010-11-30 LAB — DIFFERENTIAL
Eosinophils Absolute: 0.1 10*3/uL (ref 0.0–0.7)
Lymphocytes Relative: 22 % (ref 12–46)
Lymphs Abs: 1.1 10*3/uL (ref 0.7–4.0)
Neutrophils Relative %: 66 % (ref 43–77)

## 2010-11-30 LAB — TSH: TSH: 2.152 u[IU]/mL (ref 0.350–4.500)

## 2010-11-30 LAB — POCT CARDIAC MARKERS: Myoglobin, poc: 100 ng/mL (ref 12–200)

## 2010-11-30 LAB — PROTIME-INR
INR: 1.1 (ref 0.00–1.49)
Prothrombin Time: 13.7 seconds (ref 11.6–15.2)

## 2010-11-30 LAB — URINE MICROSCOPIC-ADD ON

## 2010-11-30 LAB — HEMOCCULT GUIAC POC 1CARD (OFFICE): Fecal Occult Bld: POSITIVE

## 2010-12-12 ENCOUNTER — Other Ambulatory Visit: Payer: Self-pay | Admitting: Family Medicine

## 2010-12-12 MED ORDER — ALPRAZOLAM 0.5 MG PO TABS: 0.5000 mg | ORAL_TABLET | Freq: Three times a day (TID) | ORAL | Status: DC | PRN

## 2010-12-12 MED ORDER — TEMAZEPAM 30 MG PO CAPS: 30.0000 mg | ORAL_CAPSULE | Freq: Every evening | ORAL | Status: DC | PRN

## 2010-12-12 NOTE — Telephone Encounter (Signed)
Last seen 05/22/10 and filled 08/27/10 please advise     KP

## 2010-12-12 NOTE — Telephone Encounter (Signed)
Rx Faxed    KP 

## 2010-12-12 NOTE — Telephone Encounter (Signed)
Patient called to give [place] prescription order--I asked her to call her pharmacy ([facility])---she said they always messed up her order and would only give her a 30 day prescription instead of 90 days plus refill---I told her to call [facility] and that I would advise nurse that you needed 90 day prescriptions plus one refill

## 2011-01-07 NOTE — Discharge Summary (Signed)
[name], [name]                ACCOUNT NO.:  [id number]   MEDICAL RECORD NO.:  [id number]          PATIENT TYPE:  INP   LOCATION:  4737                         FACILITY:  [facility]   PHYSICIAN:  Valetta Mole. Swords, MD    DATE OF BIRTH:  1928/06/28   DATE OF ADMISSION:  04/22/2009  DATE OF DISCHARGE:                               DISCHARGE SUMMARY   DISCHARGE DIAGNOSES:  1. Presyncope.  2. Hypokalemia.  3. Anemia, heme-positive stool.  4. Hypothyroidism.  5. History of coronary artery disease status post coronary artery      bypass graft.  6. Anxiety.  7. Hyperlipidemia.  8. History of kyphoplasty secondary to osteoporosis.   DISCHARGE MEDICATIONS:  See the patient medication discharge  instructions.   CONDITION ON DISCHARGE:  Improved.   FOLLOWUP PLAN:  Dr. Sandria Manly in 2 weeks.   HOSPITAL PROCEDURES:  MRI of the brain demonstrated atrophy and small  vessel disease with no acute intracranial findings.  MRI was  unremarkable.  Abdominal x-ray demonstrated no acute findings.   HOSPITAL LABORATORIES:  TSH normal at 2.152.  Urine culture no growth.   Cardiac enzymes negative.   CBC on admission with a hemoglobin of 9.2.  Fecal occult blood positive.   HOSPITAL COURSE:  1. Presyncope.  The patient admitted to the [facility] on April 22, 2009.  See Dr. Debby Bud' admission note.  The patient admitted      with a feeling of lightheadedness.  She had 2 falls and went to the      floor.  No loss of consciousness.  The patient was admitted to the      Hospital Service for evaluation.  MRI and cardiac screening were      normal.  2. Anemia.  The patient with chronic anemia follows with Dr. Truett Perna.      Heme-positive stool, may need GI evaluation.  3. Hypothyroidism, stable.  4. Hypokalemia, likely secondary to copious amounts of fluid intake as      well as diuretics use.  This was replaced and resolved quickly.      The patient is instructed not to drink more than 8 glass  of water      daily.   Greater than 30 minutes spent on discharge planning.      Bruce Rexene Edison Swords, MD  Electronically Signed     BHS/MEDQ  D:  04/24/2009  T:  04/24/2009  Job:  [id number]

## 2011-01-07 NOTE — Discharge Summary (Signed)
Lindsey, [name]                ACCOUNT NO.:  [id number]   MEDICAL RECORD NO.:  [id number]          PATIENT TYPE:  INP   LOCATION:  1407                         FACILITY:  [facility]   PHYSICIAN:  Valerie A. Felicity Coyer, MDDATE OF BIRTH:  19-Oct-1927   DATE OF ADMISSION:  02/28/2008  DATE OF DISCHARGE:  03/02/2008                               DISCHARGE SUMMARY   PRIMARY CARE PHYSICIAN:  Lelon Perla, DO   DISCHARGE DIAGNOSES:  1. Altered mental status with hypotension secondary to problem #2.  2. Urinary tract infection with positive UA on admit.  3. Acute renal insufficiency.   HISTORY OF PRESENT ILLNESS:  [name] is a 77 year old white female  with past medical history of CAD, hyperlipidemia and anemia who  presented to [facility] Room on day of admission with 2 to 3  day history of weakness.  Per daughter's report, she checked on the  patient on morning of admission and found her to be confused with  flashlight and scissors in hand.  Patient with no recollection of this  at event or why she had these items in hand, but report increased  fatigue with chills on day prior to admission with difficulty walking  due to weakness.  Te patient also reported fall on week prior to this  admission as well as on morning of admission, both preceded by  dizziness, but denied any loss of consciousness or resulting trauma of  falls.  Evaluation in the emergency room revealed low blood pressure on  arrival to the emergency room of 79/34, responding well to IV fluids.  In addition, white cell count 9.1 with 80% neutrophilic shift and  creatinine of 3.08.  Urinalysis revealed small blood with large  leukocytes and 11-20 WBCs.  The patient was admitted at that time for  further evaluation and treatment.   PAST MEDICAL HISTORY:  1. History of CAD status post MI.  2. Hyperlipidemia.  3. Depression, anxiety.  4. Osteoporosis status post L1 KP in 2005.  5. Chronic anemia.   COURSE OF  HOSPITALIZATION:  1. Altered mental status with hypotension.  These symptoms thought      likely secondary to shock in setting of urinary tract infection.      The patient's symptoms continued to improve through hospitalization      with antibiotic and IV fluid treatment.  Patient's altered mental      status and hypotension had resolved at time of discharge.  2. UTI with positive urinalysis on admit.  The patient responded well      with decreased confusion on IV Cipro.  At time of discharge, she      will be transitioned to p.o. Cipro for a total of 7 days'      treatment.  3. Orthostatic dizziness.  Likely a result of problem #1 and #2.      However, cardiac and neurological etiology ruled out.  A CT scan of      the head performed, which was negative for any acute findings.  EKG      revealed normal sinus rhythm  with no ischemic findings.  2-D echo      performed during this admission revealed left ventricular ejection      fraction of 55% with no wall motion abnormalities.  4. Abnormal TSH.  During this admission the patient increased TSH of      10.3 to 3 likely secondary to acute illness as the patient's T3,      free T4 were within normal limits.  The patient to follow-up with      primary care physician for recheck of thyroid function tests in 4-6      weeks.   MEDICATIONS:  At time of discharge:  1. Cipro 500 mg p.o. b.i.d. until gone.  2. Requip 1 mg p.o. nightly.  3. Zanaflex 2 mg p.o. t.i.d.  4. Aspirin 81 mg p.o. daily.  5. Folic acid 1 mg p.o. daily.  6. Calcium 1500 mg p.o. daily.  7. Reglan 5 mg p.o. q.a.c. and h.s.  Note this is a new dose.  8. Simvastatin 80 mg p.o. daily.  9. Lasix 40 mg p.o. daily.  10.Potassium chloride 40 mEq p.o. daily.  11.Xanax 0.5 mg p.o. t.i.d. p.r.n. anxiety.  12.Phenergan 25 mg p.o. b.i.d. p.r.n. nausea.  13.Temazepam 30 mg p.o. nightly as needed for sleep.  14.Darvocet-N 100 one tablet every 6 hours p.r.n. pain.  15.Skelaxin 800 mg  p.o. every 6 hours p.r.n. muscle spasms.   PERTINENT LAB WORK AT TIME OF DISCHARGE:  White cell count 9.1, platelet  count 205, hemoglobin 11.0, hematocrit 32.5, sodium 139, potassium 3.3,  BUN 14, creatinine 1.60.  Again, down from 3.08 at time of admission.  TSH 10.327.  T3, Free T4 within normal limits.  A1c 5.7.  Anemia panel  within normal limits.   DISPOSITION:  The patient felt medically stable for discharge home at  this time.  The patient did undergo evaluation by PT and OT during this  hospitalization who recommended home health OT and PT at discharge,  which has been arranged.  The patient and daughter instructed to follow  up with her primary care physician, Dr. Loreen Freud on July 13 at 11:15  a.m.   Greater than 30 minutes spent on discharge planning.      [name], NP      Raenette Rover. Felicity Coyer, MD  Electronically Signed    LE/MEDQ  D:  03/30/2008  T:  03/30/2008  Job:  [id number]   cc:   Lelon Perla, DO  [address] [place], [place] [address]

## 2011-01-07 NOTE — Consult Note (Signed)
[name], [name]                ACCOUNT NO.:  [id number]   MEDICAL RECORD NO.:  [id number]          PATIENT TYPE:  INP   LOCATION:  1409                         [facility]ACILITY:  [facility]   PHYSICIAN:  Mindi Slicker. Lowell Guitar, M.D.  DATE OF BIRTH:  1927/09/07   DATE OF CONSULTATION:  DATE OF DISCHARGE:                                 CONSULTATION   HISTORY:  I was asked by Dr. Felicity Coyer to see this 77 year old female,  who is admitted after a fall.  She has apparently had progressive  weakness with recent recurrent falls, and yesterday prior to admission,  she hit her head and was admitted to [facility].  Electrocardiogram in the emergency room apparently shows questionable  atrial fibrillation, which converted to normal sinus rhythm, but it is  unclear whether she really in deed had a dysrhythmia.  Serum creatinine  was 3.9 mg/dL on admission with a BUN of 46, and this compares with a  serum creatinine on March 02, 2008 of 1.6 mg/dL with a BUN of 14.  The  patient has a history of acute renal failure in July 2009, at which time  serum creatinine was as high as 3.08 mg/dL in the setting of hypotension  and a urinary tract infection.   CURRENT MEDICATIONS:  Include aspirin, Cipro, folic acid, Protonix,  MiraLax, potassium chloride, Tylenol, Xanax, and Ambien.   PAST MEDICAL HISTORY:  Remarkable for coronary artery disease status  post CABG, dyslipidemia, depression, osteoporosis status post  kyphoplasty, history of anemia requiring erythropoietin, history of  restless legs syndrome, history of chronic kidney disease, history of  ureteral obstruction status post surgery on the left in 1992, history of  elevated TSH at 10 on February 28, 2008, history of replacement therapy, and  a history of elevated calcium in the past.   SOCIAL HISTORY:  The patient lives alone, quit smoking 50 years ago, and  does not drink alcohol.   ALLERGIES:  To BIAXIN, SULFA, CODEINE, and VALIUM per the medical  record.   FAMILY HISTORY:  Noncontributory.   REVIEW OF SYSTEMS:  Essentially as above.   PHYSICAL EXAMINATION:  VITAL SIGNS:  Blood pressure is 139/89, afebrile,  and heart rate 85.  HEENT:  Right orbital ecchymosis.  NECK:  Veins are not distended.  CHEST:  Mid sternotomy scar.  LUNGS:  Few crackles.  HEART:  Regular rhythm and rate.  Occasional extra beat.  ABDOMEN:  Soft.  Left-sided surgical scar.  EXTREMITIES:  Trace pedal edema.  NEUROLOGIC:  The patient is weak diffusely.   LABORATORY DATA:  Sodium 129, potassium 2.4, chloride 96, CO2 25, BUN  41, and creatinine 0.1, bilirubin 1.0, albumin 2.7, calcium 12.2.  Hemoglobin 11.4, MCV 101.  Bone marrow done on April 19, 2008 was  negative for malignancy.  Urinalysis; specific gravity 1.014, pH 5.5, 0-  2 white blood cells, and 0-2 red blood cells.   ASSESSMENT:  1. Chronic kidney disease possibly due to history of prior obstructive      uropathy, possibly due to atherosclerotic renovascular disease or      nephrosclerosis.  2. Acute renal failure, most likely hemodynamically mediated.  3. Hypercalcemia of uncertain etiology.  4. Anemia secondary to renal disease.  5. Hypokalemia.  6. Hyponatremia.   RECOMMENDATIONS:  1. Renal ultrasound and bladder post-void residual.  2. Check parathyroid hormone level.  3. Check vitamin D level.  4. Saline hydration as you are doing.  5. Potassium replacement per primary team.           ______________________________  Mindi Slicker. Lowell Guitar, M.D.     ACP/MEDQ  D:  06/01/2008  T:  06/02/2008  Job:  [id number]

## 2011-01-07 NOTE — Discharge Summary (Signed)
[name], [name]                ACCOUNT NO.:  [id number]   MEDICAL RECORD NO.:  [id number]          [name] TYPE:  INP   LOCATION:  1409                         FACILITY:  [facility]   PHYSICIAN:  Valerie A. Felicity Coyer, MDDATE OF BIRTH:  1927/11/20   DATE OF ADMISSION:  05/31/2008  DATE OF DISCHARGE:  06/06/2008                               DISCHARGE SUMMARY   PRIMARY CARE PHYSICIAN:  Lelon Perla, DO.   DISCHARGE DIAGNOSES:  1. Frequent falls with weakness prior to arrival, multifactorial in      nature.  2. Acute-on-chronic renal insufficiency.  3. Questionable atrial fibrillation upon admission.  4. Metabolic derangement on admission.  5. Mild rhabdomyolysis.  6. Fecal impaction.  7. Anemia of chronic disease.   HISTORY OF PRESENT ILLNESS:  Ms. [name] is a 77 year old white female  with past medical history of coronary artery disease, hyperlipidemia,  and chronic anemia.  The [name] was brought to the emergency room on  the day of admission by her daughter with reports of frequent falls.  The daughter reported the [name] had a viral illness approximately a  week prior to this admission with no chills or fever but did have some  upper respiratory congestion and sneezing.  These symptoms had improved  at the time of admission; however, the [name] had become progressively  weaker and unable to stand from her bed to her walker without falling.  Daughter reported [name] fell just prior to admission, hitting her  head, but denied any loss of consciousness.   Upon evaluation in the ER, a CT of the head was negative for any  intracranial abnormality.  [name] found to have a sodium of 125,  potassium 2.9, and creatinine of 3.9, up from baseline 1.6.  [name] was  admitted at that time for further evaluation and treatment.   PAST MEDICAL HISTORY:  1. History of CAD, status post MI.  2. Hyperlipidemia.  3. Depression.  4. Osteoporosis.  5. Chronic anemia, status post  negative bone marrow biopsy in August,      2009.  6. History of abnormal TSH.  7. History of restless legs syndrome.  8. History of frequent falls.   ALLERGIES:  [name] allergic to SULFA, BIAXIN, CODEINE, and VALIUM.   CONSULTATIONS DURING THIS ADMISSION:  [facility], Dr.  Casimiro Needle.   COURSE OF HOSPITALIZATION:  1. Weakness with frequent falls prior to arrival:  The [name]'s falls      thought likely multifactorial in nature, given metabolic      derangement and chronic gait instability.  [name] did undergo      evaluation by physical and occupational therapy during this      admission, who recommended short-term skilled nursing facility stay      for continued physical and occupational therapy at the time of      discharge.  [name] and daughter are agreeable to this plan.  2. Acute-on-chronic renal insufficiency:  As mentioned above, the      [name] with creatinine of 3.13 at the time of admission, likely      secondary to  volume depletion with recent viral illness and      decreased p.o. intake.  Incidentally, prior to this admission,      [name] was due for an initial office visit with Dr. Briant Cedar;      therefore, renal was asked to see [name] in consultation.  Please      see Dr. Roanna Banning complete consultation dictation regarding his      visit.  [name] responded quite well to IV fluids.  At the time of      dictation, [name]'s creatinine has returned to baseline.  Renal      ultrasound was obtained, which was negative for any acute findings.      [name] did have a slightly abnormal intact parathyroid hormone      level at 9.3.  Vitamin D level was within normal limits.  At this      time, there is no scheduled followup with nephrology; however,      [name] can follow up on an outpatient basis as needed.  3. Questionable atrial fibrillation on admission:  Emergency room      physician reported A fib on [name]'s arrival to emergency room;       however, upon review of [name]'s initial EKG, more specifically,      when viewing lead V3, it appears [name] was in a sinus rhythm.      Despite initial findings, [name] has remained in normal sinus      rhythm since arrival to the floor.  [name] would not be an ideal      Coumadin candidate regardless, due to frequent falls and advanced      age.  Will continue aspirin at time of discharge.  4. Mild rhabdomyolysis secondary to fall prior to arrival.  [name]      with slightly elevated CK at time of admission that responded quite      well to IV-fluid hydration.  5. Fecal impaction revealed on KUB at the time of admission.      [name]'s impaction resolved with daily MiraLax and Colace.  Will      continue Colace with p.r.n. MiraLax at the time of discharge.   MEDICATIONS AT TIME OF DISCHARGE:  1. Simvastatin 80 mg p.o. daily.  2. Aspirin 325 mg p.o. daily.  3. Darvocet-N 100 1 tab p.o. q.6h. p.r.n. pain.  4. Xanax 0.5 mg 1 tab p.o. q.8h. p.r.n. anxiety.  5. Temazepam 30 mg p.o. nightly p.r.n. insomnia.  6. Folic acid 10 mg p.o. daily.  7. Colace 100 mg p.o. b.i.d.  8. Tylenol 650 mg p.o. q.4h. p.r.n. pain or fever.  9. Reglan 10 mg p.o. t.i.d. p.r.n. nausea.  10.Requip 1 mg p.o. nightly p.r.n.  11.Synthroid 25 mcg tabs 1 tab p.o. daily.  12.Iron sulfate 325 mg p.o. b.i.d.   PHYSICAL EXAMINATION AT DISCHARGE:  Blood pressure 114/60, heart rate  75, respirations 16, temp 98.6, O2 sat 97% on room air.  GENERAL:  This is an elderly white female awake and alert in no acute  distress.  Head is normocephalic.  Bruising noted around right periorbital region  secondary to fall prior to arrival.  Is healing within normal limits.  Eyes:  Pupils are equal, round and reactive to light.  Extraocular  movements are intact.  No scleral icterus or injection.  Ears, nose, and  throat:  Mucous membranes are moist with no oropharyngeal lesions.  [name] mildly hard of hearing.  NECK:  Thin,  supple.  No thyromegaly or  lymphadenopathy.  CHEST:  Symmetrical movement.  Nontender to palpation.  CARDIOVASCULAR:  S1 and S2.  Regular rate and rhythm.  No lower  extremity edema.  No JVD or carotid bruits.  RESPIRATORY:  Lung sounds are clear to auscultation bilaterally.  No  wheezes, rales or crackles.  No increased work of breathing.  GI:  Abdomen is soft, nontender and nondistended with positive bowel  sounds.  No appreciated masses or hepatosplenomegaly.  SKIN:  Without any suspicious rashes or lesions.  NEUROLOGIC:  [name] moves all extremities x4 without any focal motor or  sensory deficits on exam.  Psychologically, [name] is alert and  oriented x3 with very pleasant affect.   PERTINENT LAB WORK AT DISCHARGE:  White cell count 5.3, platelet count  199, hemoglobin 11, hematocrit 33.4.  Sodium 136, potassium 4.2, BUN 16,  creatinine 1.27, vitamin D 43.  Intact parathyroid hormone 9.3.  Total  calcium 11.8.  Liver function tests within normal limits.  Total albumin  2.5.  TSH 5.610.  Hemoglobin A1C 4.8.  Total cholesterol 95,  triglycerides 59, HDL 44, LDL 39.   DISPOSITION:  [name] felt medically stable for discharge to skilled  nursing facility at this time.   Greater than 30 minutes spent on discharge planning.      [name], NP      Raenette Rover. Felicity Coyer, MD  Electronically Signed    LE/MEDQ  D:  06/07/2008  T:  06/07/2008  Job:  [id number]   cc:   Lelon Perla, DO  [address] [place], [place] [address]   Mindi Slicker Lowell Guitar, M.D.  Fax: [phone]

## 2011-01-07 NOTE — H&P (Signed)
NAMESHARREN, [name]                ACCOUNT NO.:  [id number]   MEDICAL RECORD NO.:  [id number]          PATIENT TYPE:  INP   LOCATION:  1409                         FACILITY[facility]   PHYSICIAN:  Michiel Cowboy, MDDATE OF BIRTH:  [birth year]   DATE OF ADMISSION:  05/31/2008  DATE OF DISCHARGE:                              HISTORY & PHYSICAL   PRIMARY CARE [place]:  Lelon Perla, D.O.   CHIEF COMPLAINT:  Falls.   HISTORY OF THE PRESENT ILLNESS:  The patient is a 77 year old female  with a history of recent frequent falls.  She had an upper respiratory  infection about a week ago with no chills or fevers, but had an  occasional cough and sneezing.  This has actually improved, but since  then she had been having some generalized weakness and malaise.  She has  been unable to stand up from her bed to her walker and has fallen;  however, for the past 24 hours she has fell five times hitting her head,  but with no loss of consciousness.   REVIEW OF SYSTEMS:  The patient denies any chest pain or shortness of  breath.  She is a bit hard of hearing, but is otherwise alert and  oriented.  She has had no fevers and no chills at home, and no nausea or  vomiting.  She does endorse constipation, but no diarrhea, and endorses  abdominal distention.  Otherwise the review of systems is unremarkable,  except for that as mentioned in the HPI.   FAMILY HISTORY:  The family history is noncontributory.   SOCIAL HISTORY:  The patient used to smoke, but quit 50 years or so ago.  She does not ever use alcohol.  She lives at home by herself.   PAST MEDICAL HISTORY:  The past medical history is significant for:  1. History of coronary artery disease status post MI.  2. Hyperlipidemia.  3. Stable __________ .  4. Depression  5. Osteoporosis.  6. Chronic anemia followed by hematology.  7. Chronic renal insufficiency; creatinine baseline is 1.6.  8. History of abnormal TSH.  9. Restless leg  syndrome.   ALLERGIES:  The patient is ALLERGIC TO SULFA, BIAXIN, CODEINE AND  VALIUM.   MEDICATIONS:  1. Simvastatin 80 mg by mouth daily.  2. Aspirin 81 mg 2 tabs by mouth daily.  3. Lasix 40 mg as needed.  4. Skelaxin 800 mg as needed.  5. Darvocet as needed.  6. Alprazolam 0.5 mg by mouth three times a day.  7. Temazepam 30 mg by mouth at bedtime.  8. Phenergan 25 mg twice a day as needed.  9. Folic acid once a day.  10.Reglan 10 mg four times a day as needed.  11.Recently started on Cipro for presumed urinary tract infection.   PHYSICAL EXAMINATION:  VITAL SIGNS:  Temperature 98.2, blood pressure  139/65, pulse 89, respirations 20, and satting at 99% on room air.  GENERAL APPEARANCE:  The patient appears to be in no acute distress.  HEENT AND SKIN:  Head; there is a large hematoma over the right  eye.  Decreased skin turgor.  Dry mucous membranes.  LUNGS:  The lungs have good aeration.  No crackles can be identified.  HEART:  The heart is rapid, irregularly irregular.  No murmurs, rubs or  gallops.  ABDOMEN:  The abdomen is slightly distended, but with normal bowel  sounds.  Nontender.  EXTREMITIES:  Lower extremities are without clubbing, cyanosis or edema.  NEUROLOGIC:  The neurological exam is nonfocal.  Moving all four  extremities; although, she is generally weak, but no localizing  weakness.   LABORATORY DATA:  White blood cell count 10.4 and hemoglobin 13.9.  Sodium 125, potassium 2.9, creatinine 3.9, which is up from its baseline  of 1.6, and BUN 46.  Myoglobin elevated to 500.  UA unremarkable.  CAT  scan showed no intracranial abnormality.  Chest x-ray showed no  cardiopulmonary disease.  Hip films showed no fractures.  EKG is  worrisome for atrial flutter versus atrial fibrillation.  Per the  patient's family she has no known history of this in the past.  Heart  rate is 96, irregularly irregular, but also poor baseline.   ASSESSMENT AND PLAN:  This is an  77 year old female with history of:  1. Depression.  2. Hyperlipidemia.  3. Coronary artery disease.  4. The patient presents with deconditioning and possible new onset      atrial fibrillation/atrial flutter   1. Generalized weakness and fatigue.  I wonder if this is secondary to      atrial fibrillation/atrial flutter; this is a new diagnosis.  We      will obtain a two-dimensional echocardiogram.  We will avoid      anticoagulation as the patient has had recent falls and actually      currently has a large hematoma over her face.  We will check a      thyroid stimulating hormone level.  Right now she is currently rate-      controlled.  She may need to start on some type of rate control      agent as needed.  I suspect that possibly this is secondary to      dehydration versus recent upper respiratory infection; but,      nonetheless we will cycle cardiac enzymes although this is not      atypical reason for ischemia.  We will admit her to telemetry.  If      severe tachycardia develops she may need a cardiology consult.  We      will also increase aspirin to 325.  2. Deconditioning.  We will obtain physical therapy and occupational      therapy consults.  3. Anemia; actually this is currently improved.  We will check B12 and      folate levels.  4. Recent possible urinary tract infection.  We will continue Cipro      for now x3 days.  5. Dehydration.  We will check orthostatics and give intravenous      fluids.  6. Low sodium.  This is likely secondary to dehydration, but we will      check urine electrolytes and thyroid stimulating hormone; and, we      will see if she responds to intravenous fluids.  7. Decreased potassium.  We will replace.  8. Code Status.  The patient is Do Not Resuscitate and Do Not      Intubate.  9. Prophylaxes.  Protonix and sequential compressive devices.      Michiel Cowboy, MD  Electronically Signed     AVD/MEDQ  D:  06/01/2008  T:   06/01/2008  Job:  [id number]

## 2011-01-07 NOTE — H&P (Signed)
NAMEEIMY, PLAZA                ACCOUNT NO.:  [id number]   MEDICAL RECORD NO.:  [id number]          PATIENT TYPE:  INP   LOCATION:  0106                         FACILITY[facility]  [facility]   PHYSICIAN:  Valerie A. Felicity Coyer, MDDATE OF BIRTH:  01-19-1928   DATE OF ADMISSION:  02/28/2008  DATE OF DISCHARGE:                              HISTORY & PHYSICAL   PRESENTING COMPLAINT:  Lelon Perla, DO   CHIEF COMPLAINT:  Confusion this morning.  Weakness for 2-3 days, now  with low blood pressure.   HISTORY OF PRESENT ILLNESS:  The patient is a 77 year old white female  with history of coronary artery disease, dyslipidemia, who presents with  a 2-3 day history of weakness to the [facility] emergency room.  The  patient's daughter at bedside reports she went to check on the patient  this morning in her home and found her to be confused holding a  flashlight and scissors.  The patient denied recollection of why she had  gotten these items, but does report that she had been feeling tired and  fatigued yesterday with positive chills.  No specific fever or sweats.  She attempted this morning to get up twice with difficulty walking due  to overwhelming weakness diffusely.  She did fall this morning but had  no loss of consciousness no sustained trauma or head injury.  She also  recalls a fall last week which was preceded by dizziness and a loss of  consciousness, but no dizziness or loss of consciousness today.  No  trauma with a fall from last week either.  Denies chest pain.  Denies  headache, denies back pain or other joint symptoms.  Daughter is at  bedside at this time.   PAST MEDICAL HISTORY:  1. Coronary artery disease status post MI.  2. Dyslipidemia.  3. Depression/anxiety history.  4. Osteoporosis with history of compression fracture status post L1      kyphoplasty 2005.  5. Chronic anemia.   CURRENT MEDICATIONS:  According to EMR include:  1. Zocor 80 mg q.h.s.  2. Aspirin 81 mg 2  tablets daily.  3. Lasix 20 mg daily.  4. Potassium chloride 20 mEq daily.  5. Darvocet as needed.  6. Xanax 0.5 t.i.d. p.r.n.  7. Restoril 30 mg q.h.s. p.r.n.  8. Folic acid 10 mg daily.  9. Reglan 10 mg q.i.d.  10.Tizanidine 2 mg t.i.d.  11.Phenergan 25 mg q.i.d. p.r.n.  12.Requip 1 mg q.h.s.  13.Fosamax weekly.  14.Iron supplement daily.   ALLERGIES:  SULFA, CODEINE, BIAXIN AND VALIUM.   FAMILY HISTORY:  Reviewed and noncontributory to this admission.   SOCIAL HISTORY:  She lives alone, but has close family contact.  Remote  tobacco.  No alcohol use.   REVIEW OF SYSTEMS:  GENERAL: Negative fever, positive chills.  HEENT:  Positive dental pain which has improved since seeing a dentist recently.  Negative for bruising, swelling or other mouth pain.  NECK:  No  stiffness, no masses or swelling.  CARDIOVASCULAR:  No chest pain.  No  palpitations.  No lower extremity swelling.  RESPIRATORY:  No shortness  of breath.  No cough.  GI:  Positive for chronic nausea, negative  anorexia.  Positive abdominal pain over this past weekend, negative for  vomiting or diarrhea.  GU: Positive dysuria.  Negative hematuria.  MUSCULOSKELETAL:  No pain.  No swelling.  ENDOCRINE:  No polyuria,  polydipsia or polyphagia.  NEUROLOGICAL:  No headache, no dizziness.  PSYCHIATRIC:  No depression or anxiety symptoms presently.   PHYSICAL EXAMINATION:  VITAL SIGNS:  Initial blood pressure 79/34 now  96/43, heart rate initial 104 and now 75, temperature 99.5, respiration  16, sating 98% on room air.  GENERAL:  She is awake, alert, thin, but appropriate age appearing.  In  no acute distress.  HEENT:  Head is normocephalic, atraumatic.  Eyes are PERRL.  EOMI.  ENT:  Shows no dental lesions.  Oropharynx is clear.  Mucous membranes are  moist.  NECK:  Thin and supple.  No thyromegaly.  No masses.  No  lymphadenopathy.  CHEST:  Chest symmetrical movement, nontender to palpation.  Respirations clear to  auscultation bilaterally.  No wheeze, crackle or  rhonchi.  There is no increased work of breathing or use of accessory  muscles.  CARDIOVASCULAR:  Normal S1-S2 with a regular rate and rhythm.  No  murmurs, rubs or gallops.  No lower extremity edema bilaterally.  No JVD  in the neck.  GI:  Has a flat abdomen which is nontender, nondistended.  Positive  bowel sounds.  No masses.  Musculoskeletal:  No deformities, normal range of motion.  Gait not  tested.  SKIN:  No rashes, lesions or nodules.  PSYCHIATRIC:  She is awake, alert, oriented x3, very pleasant with a  normal mood, normal affect.   LABORATORY DATA:  Reviewed CBC with normal white count of 9.1 with 80%  left shift.  Hemoglobin 11.0, platelets of 205.  Basic metabolic  significant for potassium of 3.4 and creatinine of 3.08.  Old record  review shows a creatinine of 1.3 in August of 2007, glucose also mildly  elevated at 122.  LFTs within normal limits, noting an albumin of 3.3,  lactic acid mildly elevated at 2.7.  Urinalysis shows small blood, large  leukocytes with 11-20 white blood cells and no RBCs.  EKG personally  reviewed shows normal sinus rhythm.  No acute ischemic changes, old  infarct as noted.  CT head no acute findings.  Chest x-ray also no acute  findings and reviewed.   ASSESSMENT/PLAN:  1. Altered mental status associated with significant hypotension, now      improved with IV fluid and normalization of her mental status.  We      will rule out sepsis, especially from urinary source with blood      cultures x2.  Suspect symptoms primarily due to urinary tract      infection.  See below.  Continue IV fluid hydration as well as IV      antibiotics and hold home diuretics.  2. Urinary tract infection.  Positive urinalysis in the setting of      hypotension.  Will send urine for culture and start empiric Cipro,      low grade fever noted but normal white count, recheck CBC in a.m.  3. Weakness diffuse.  Suspect  due to problems #1 and #2 above.  Will      plan to rule out other etiology with monitoring of electrolytes.      Check TSH and monitor on telemetry to rule out arrhythmias.  PT and      OT to see when blood pressure is stable.  4. Mild hypokalemia.  Suspect due to diuretic chronic use.  Will add      p.o. replacement.  Recheck in a.m.  5. Acute renal insufficiency, baseline creatinine previously at 1.3.      Suspect dehydration as well as hypoperfusion due to the above      problems.  Monitor with IV fluid and recheck in a.m.  Monitor urine      output as well with Foley p.r.n.  6. Abdominal pain, currently resolved, but present over the past      weekend according to the daughter.  Will check KUB.  Note LFTs      within normal limits.  No other GI complaints.  Monitor for      recurrence of symptoms in the setting of the other above problems.  7. Anemia reported history of same.  Suspect chronic disease.  Will      check anemia panel.  No history of acute blood loss or evidence on      exam with current bleeding.  Recheck on CBC in a.m.  8. Mild hyperglycemia, random.  No history of elevated CBGs or      diabetes noted.  Will check A1c and no intervention at this time.      May be due to stress of the above.  9. Dyslipidemia.  Will check CK to rule out rhabdomyolysis and hold      her statin therapy at this time.  10.History of coronary disease status post MI.  EKG without acute      findings and no current cardiac complaints.  Will monitor and      consider need for echocardiogram depending on hemodynamic status      with treatment of infectious etiologies.   DISPOSITION:  PT/OT will evaluate per recommendations as above is  stable.  Anticipate discharge home as prior to admission with a possible  assistance of home health.  Please see orders for further details.      Valerie A. Felicity Coyer, MD  Electronically Signed     VAL/MEDQ  D:  02/28/2008  T:  02/28/2008  Job:  [id number]

## 2011-01-17 ENCOUNTER — Telehealth: Payer: Self-pay | Admitting: Family Medicine

## 2011-01-17 ENCOUNTER — Encounter: Payer: Self-pay | Admitting: Family Medicine

## 2011-01-17 ENCOUNTER — Ambulatory Visit (INDEPENDENT_AMBULATORY_CARE_PROVIDER_SITE_OTHER): Payer: MEDICARE | Admitting: Family Medicine

## 2011-01-17 VITALS — BP 114/70 | HR 78 | Wt 95.0 lb

## 2011-01-17 DIAGNOSIS — R0789 Other chest pain: Secondary | ICD-10-CM

## 2011-01-17 DIAGNOSIS — R42 Dizziness and giddiness: Secondary | ICD-10-CM

## 2011-01-17 LAB — BASIC METABOLIC PANEL
GFR: 57.25 mL/min — ABNORMAL LOW (ref >60.00)
Glucose, Bld: 101 mg/dL — ABNORMAL HIGH (ref 70–99)
Potassium: 4.1 mEq/L (ref 3.5–5.1)
Sodium: 140 mEq/L (ref 135–145)

## 2011-01-17 LAB — HEPATIC FUNCTION PANEL
ALT: 24 U/L (ref 0–35)
AST: 31 U/L (ref 0–37)
Albumin: 3.7 g/dL (ref 3.5–5.2)
Alkaline Phosphatase: 69 U/L (ref 39–117)
Total Protein: 6.3 g/dL (ref 6.0–8.3)

## 2011-01-17 LAB — POCT URINALYSIS DIPSTICK
Bilirubin, UA: NEGATIVE
Glucose, UA: NEGATIVE
Ketones, UA: NEGATIVE
Leukocytes, UA: NEGATIVE
Nitrite, UA: NEGATIVE

## 2011-01-17 LAB — CBC WITH DIFFERENTIAL/PLATELET
Basophils Absolute: 0 10*3/uL (ref 0.0–0.1)
Basophils Relative: 0.4 % (ref 0.0–3.0)
Eosinophils Relative: 0.4 % (ref 0.0–5.0)
HCT: 38.6 % (ref 36.0–46.0)
Hemoglobin: 13.2 g/dL (ref 12.0–15.0)
Lymphs Abs: 1.6 10*3/uL (ref 0.7–4.0)
Monocytes Relative: 8.2 % (ref 3.0–12.0)
Neutro Abs: 3.1 10*3/uL (ref 1.4–7.7)
RBC: 4.05 Mil/uL (ref 3.87–5.11)
RDW: 13 % (ref 11.5–14.6)

## 2011-01-17 LAB — D-DIMER, QUANTITATIVE: D-Dimer, Quant: 0.35 ug/mL-FEU (ref 0.00–0.48)

## 2011-01-17 MED ORDER — OMEPRAZOLE 20 MG PO CPDR: 20.0000 mg | DELAYED_RELEASE_CAPSULE | Freq: Every day | ORAL | Status: DC

## 2011-01-17 NOTE — Telephone Encounter (Signed)
Reviewed pt's labs in Dr Ernst Spell absence.  All labs are normal.  No evidence of cardiac involvement at this time.  If she continues to have chest pain over the weekend, she needs to go to the ER for evaluation.  Please notify pt of this.

## 2011-01-17 NOTE — Telephone Encounter (Signed)
Spoke w/ pt daughter aware of information.  

## 2011-01-17 NOTE — Progress Notes (Signed)
Addended by: Doristine Devoid on: 01/17/2011 01:52 PM   Modules accepted: Orders

## 2011-01-17 NOTE — Patient Instructions (Signed)
Chest Pain (Nonspecific) It is often hard to give a specific diagnosis for the cause of chest pain. There is always a chance that your pain could be related to something serious, such as a heart attack or a blood clot in the lungs. You need to follow up with your caregiver for further evaluation. CAUSES  Heartburn.   Pneumonia or bronchitis.   Anxiety and stress.   Inflammation around your heart (pericarditis) or lung (pleuritis or pleurisy).   A blood clot in the lung.   A collapsed lung (pneumothorax). It can develop suddenly on its own (spontaneous pneumothorax) or from injury (trauma) to the chest.  The chest wall is composed of bones, muscles, and cartilage. Any of these can be the source of the pain.  The bones can be bruised by injury.   The muscles or cartilage can be strained by coughing or overwork.   The cartilage can be affected by inflammation and become sore (costochondritis).  DIAGNOSIS Lab tests or other studies, such as X-rays, an EKG, stress testing, or cardiac imaging, may be needed to find the cause of your pain.  TREATMENT  Treatment depends on what may be causing your chest pain. Treatment may include:   Acid blockers for heartburn.  Anti-inflammatory medicine.   Pain medicine for inflammatory conditions.  Antibiotics if an infection is present.    You may be advised to change lifestyle habits. This includes stopping smoking and avoiding caffeine and chocolate.   You may be advised to keep your head raised (elevated) when sleeping. This reduces the chance of acid going backward from your stomach into your esophagus.   Most of the time, nonspecific chest pain will improve within 2 to 3 days with rest and mild pain medicine.  HOME CARE INSTRUCTIONS  If antibiotics were prescribed, take the full amount even if you start to feel better.   For the next few days, avoid physical activities that bring on chest pain. Continue physical activities as directed.     Do not smoke cigarettes or drink alcohol until your symptoms are gone.   Only take over-the-counter or prescription medicine for pain, discomfort, or fever as directed by your caregiver.   Follow your caregiver's suggestions for further testing if your chest pain does not go away.   Keep any follow-up appointments you made. If you do not go to an appointment, you could develop lasting (chronic) problems with pain. If there is any problem keeping an appointment, you must call to reschedule.  SEEK MEDICAL CARE IF:  You think you are having problems from the medicine you are taking. Read your medicine instructions carefully.   Your chest pain does not go away, even after treatment.   You develop a rash with blisters on your chest.  SEEK IMMEDIATE MEDICAL CARE IF:  You have increased chest pain or pain that spreads to your arm, neck, jaw, back, or belly (abdomen).   You develop shortness of breath, an increasing cough, or you are coughing up blood.   You have severe back or abdominal pain, feel sick to your stomach (nauseous) or throw up (vomit).   You develop severe weakness, fainting, or chills.   You have an oral temperature above 100.4, not controlled by medicine.  THIS IS AN EMERGENCY. Do not wait to see if the pain will go away. Get medical help at once. Call your local emergency services 911 (911 in [place] Do not drive yourself to the hospital. MAKE SURE YOU:  Understand these   instructions.   Will watch your condition.   Will get help right away if you are not doing well or get worse.  Document Released: 05/21/2005 Document Re-Released: 11/05/2009 [employer] Patient Information 2011 [employer]. 

## 2011-01-17 NOTE — Progress Notes (Signed)
  Subjective:     [name] is an 77 y.o. female who presents for evaluation of heartburn. This has been associated with belching, chest pain, heartburn and dizziness. She denies cough, laryngitis, nausea, regurgitation of undigested food, unexpected weight loss and heartburn and chest pain resolved yesterday.  pt refused ER.Marland Kitchen Symptoms have been present for 1 week. She denies dysphagia. She has not lost weight. She denies melena, hematochezia, hematemesis, and coffee ground emesis. Medical therapy in the past has included: reglan.  The following portions of the patient's history were reviewed and updated as appropriate: allergies, current medications, past family history, past medical history, past social history, past surgical history and problem list.  Review of Systems Pertinent items are noted in HPI.   Objective:     BP 114/70  Pulse 78  Wt 95 lb (43.092 kg) General appearance: alert, cooperative, appears stated age, cachectic and no distress Throat: lips, mucosa, and tongue normal; teeth and gums normal Neck: no adenopathy, no carotid bruit, no JVD, supple, symmetrical, trachea midline and thyroid not enlarged, symmetric, no tenderness/mass/nodules Lungs: clear to auscultation bilaterally Heart: regular rate and rhythm, S1, S2 normal, no murmur, click, rub or gallop Abdomen: soft, non-tender; bowel sounds normal; no masses,  no organomegaly Extremities: extremities normal, atraumatic, no cyanosis or edema   Assessment:   Atypical chest pain---? gerd---s/p MI---has not gone back to cardiology yet Daughter will reschedule cardio appointment --pt refusing hospital but if symptoms return she will go ? gerd-- omeprazole 20 mg 1 po qd  Check labs stat hgb 13 today    Plan:    Will start a trial of proton pump inhibitors and see above. Follow up in 2 weeks or sooner as needed.

## 2011-01-17 NOTE — Telephone Encounter (Signed)
Reviewed pt's labs in Dr Lowne's absence.  All labs are normal.  No evidence of cardiac involvement at this time.  If she continues to have chest pain over the weekend, she needs to go to the ER for evaluation.  Please notify pt of this. 

## 2011-03-13 ENCOUNTER — Other Ambulatory Visit: Payer: Self-pay | Admitting: Family Medicine

## 2011-03-13 MED ORDER — TEMAZEPAM 30 MG PO CAPS: 30.0000 mg | ORAL_CAPSULE | Freq: Every evening | ORAL | Status: DC | PRN

## 2011-03-13 NOTE — Telephone Encounter (Signed)
Faxed.   KP 

## 2011-03-13 NOTE — Telephone Encounter (Signed)
Last seen 01/17/11 and filled 12/12/10 # 90  please advise    KP

## 2011-05-22 LAB — BASIC METABOLIC PANEL
BUN: 14
BUN: 27 — ABNORMAL HIGH
Calcium: 11.2 — ABNORMAL HIGH
Calcium: 11.4 — ABNORMAL HIGH
Chloride: 104
Chloride: 110
Creatinine, Ser: 1.6 — ABNORMAL HIGH
Creatinine, Ser: 1.97 — ABNORMAL HIGH
GFR calc Af Amer: 30 — ABNORMAL LOW
GFR calc Af Amer: 38 — ABNORMAL LOW
GFR calc non Af Amer: 16 — ABNORMAL LOW
GFR calc non Af Amer: 25 — ABNORMAL LOW
GFR calc non Af Amer: 31 — ABNORMAL LOW
Glucose, Bld: 89
Potassium: 4
Sodium: 141
Sodium: 143

## 2011-05-22 LAB — T3, FREE: T3, Free: 2.4 (ref 2.3–4.2)

## 2011-05-22 LAB — COMPREHENSIVE METABOLIC PANEL
ALT: 23
AST: 34
Albumin: 3.3 — ABNORMAL LOW
Alkaline Phosphatase: 54
Glucose, Bld: 122 — ABNORMAL HIGH
Potassium: 3.4 — ABNORMAL LOW
Sodium: 136
Total Protein: 6

## 2011-05-22 LAB — CBC
HCT: 32.5 — ABNORMAL LOW
Hemoglobin: 11 — ABNORMAL LOW
Hemoglobin: 11 — ABNORMAL LOW
MCV: 97
Platelets: 210
RDW: 12.5
RDW: 12.6
WBC: 9

## 2011-05-22 LAB — URINALYSIS, ROUTINE W REFLEX MICROSCOPIC
Nitrite: NEGATIVE
Specific Gravity, Urine: 1.016
pH: 5

## 2011-05-22 LAB — CULTURE, BLOOD (ROUTINE X 2)

## 2011-05-22 LAB — HEMOGLOBIN A1C: Hgb A1c MFr Bld: 5.7

## 2011-05-22 LAB — URINE CULTURE

## 2011-05-22 LAB — IRON AND TIBC
Saturation Ratios: 24
TIBC: 234 — ABNORMAL LOW

## 2011-05-22 LAB — FOLATE: Folate: >20

## 2011-05-22 LAB — POCT CARDIAC MARKERS
CKMB, poc: 1.3
Myoglobin, poc: >500
Operator id: 264031

## 2011-05-22 LAB — URINE MICROSCOPIC-ADD ON

## 2011-05-22 LAB — RETICULOCYTES: RBC.: 3.4 — ABNORMAL LOW

## 2011-05-22 LAB — DIFFERENTIAL
Lymphocytes Relative: 11 — ABNORMAL LOW
Lymphs Abs: 1
Monocytes Relative: 8
Neutro Abs: 7.3
Neutrophils Relative %: 80 — ABNORMAL HIGH

## 2011-05-22 LAB — FERRITIN: Ferritin: 138 (ref 10–291)

## 2011-05-22 LAB — T4, FREE: Free T4: 1.14

## 2011-05-22 LAB — LACTIC ACID, PLASMA: Lactic Acid, Venous: 2.7 — ABNORMAL HIGH

## 2011-05-23 ENCOUNTER — Encounter: Payer: Self-pay | Admitting: Family Medicine

## 2011-05-26 ENCOUNTER — Ambulatory Visit (INDEPENDENT_AMBULATORY_CARE_PROVIDER_SITE_OTHER): Payer: MEDICARE | Admitting: Family Medicine

## 2011-05-26 ENCOUNTER — Encounter: Payer: Self-pay | Admitting: Family Medicine

## 2011-05-26 VITALS — BP 90/56 | HR 81 | Temp 98.0°F | Ht 64.0 in | Wt 85.6 lb

## 2011-05-26 DIAGNOSIS — Z0000 Encounter for general adult medical examination without abnormal findings: Secondary | ICD-10-CM

## 2011-05-26 DIAGNOSIS — F411 Generalized anxiety disorder: Secondary | ICD-10-CM

## 2011-05-26 DIAGNOSIS — R110 Nausea: Secondary | ICD-10-CM

## 2011-05-26 DIAGNOSIS — M8580 Other specified disorders of bone density and structure, unspecified site: Secondary | ICD-10-CM

## 2011-05-26 DIAGNOSIS — E039 Hypothyroidism, unspecified: Secondary | ICD-10-CM

## 2011-05-26 DIAGNOSIS — M810 Age-related osteoporosis without current pathological fracture: Secondary | ICD-10-CM | POA: Insufficient documentation

## 2011-05-26 DIAGNOSIS — G4700 Insomnia, unspecified: Secondary | ICD-10-CM | POA: Insufficient documentation

## 2011-05-26 DIAGNOSIS — Z23 Encounter for immunization: Secondary | ICD-10-CM

## 2011-05-26 DIAGNOSIS — I252 Old myocardial infarction: Secondary | ICD-10-CM

## 2011-05-26 DIAGNOSIS — F419 Anxiety disorder, unspecified: Secondary | ICD-10-CM

## 2011-05-26 DIAGNOSIS — M1990 Unspecified osteoarthritis, unspecified site: Secondary | ICD-10-CM

## 2011-05-26 DIAGNOSIS — I10 Essential (primary) hypertension: Secondary | ICD-10-CM

## 2011-05-26 DIAGNOSIS — E785 Hyperlipidemia, unspecified: Secondary | ICD-10-CM

## 2011-05-26 DIAGNOSIS — G2581 Restless legs syndrome: Secondary | ICD-10-CM

## 2011-05-26 LAB — HEPATIC FUNCTION PANEL
ALT: 25 U/L (ref 0–35)
AST: 28 U/L (ref 0–37)
Albumin: 4.1 g/dL (ref 3.5–5.2)
Alkaline Phosphatase: 58 U/L (ref 39–117)
Total Bilirubin: 0.6 mg/dL (ref 0.3–1.2)

## 2011-05-26 LAB — CBC WITH DIFFERENTIAL/PLATELET
Eosinophils Relative: 0.8 % (ref 0.0–5.0)
HCT: 40.4 % (ref 36.0–46.0)
Hemoglobin: 13.2 g/dL (ref 12.0–15.0)
Lymphs Abs: 1.7 10*3/uL (ref 0.7–4.0)
Monocytes Relative: 8.6 % (ref 3.0–12.0)
Neutro Abs: 3.5 10*3/uL (ref 1.4–7.7)
WBC: 5.8 10*3/uL (ref 4.5–10.5)

## 2011-05-26 LAB — BASIC METABOLIC PANEL
GFR: 56.54 mL/min — ABNORMAL LOW (ref >60.00)
Potassium: 3.8 mEq/L (ref 3.5–5.1)
Sodium: 139 mEq/L (ref 135–145)

## 2011-05-26 LAB — TSH: TSH: 0.7 u[IU]/mL (ref 0.35–5.50)

## 2011-05-26 LAB — LIPID PANEL
Cholesterol: 146 mg/dL (ref 0–200)
LDL Cholesterol: 72 mg/dL (ref 0–99)

## 2011-05-26 MED ORDER — LEVOTHYROXINE SODIUM 25 MCG PO TABS: 25.0000 ug | ORAL_TABLET | Freq: Every day | ORAL | Status: DC

## 2011-05-26 MED ORDER — ROPINIROLE HCL 1 MG PO TABS: 1.0000 mg | ORAL_TABLET | Freq: Every day | ORAL | Status: DC

## 2011-05-26 MED ORDER — ALPRAZOLAM 0.5 MG PO TABS: ORAL_TABLET | ORAL | Status: DC

## 2011-05-26 MED ORDER — TRAZODONE HCL 50 MG PO TABS: 50.0000 mg | ORAL_TABLET | Freq: Every day | ORAL | Status: AC

## 2011-05-26 MED ORDER — TRAMADOL HCL 50 MG PO TABS: 50.0000 mg | ORAL_TABLET | Freq: Four times a day (QID) | ORAL | Status: DC | PRN

## 2011-05-26 MED ORDER — SIMVASTATIN 80 MG PO TABS: 80.0000 mg | ORAL_TABLET | Freq: Every day | ORAL | Status: DC

## 2011-05-26 MED ORDER — PROMETHAZINE HCL 25 MG PO TABS: 25.0000 mg | ORAL_TABLET | Freq: Four times a day (QID) | ORAL | Status: DC | PRN

## 2011-05-26 MED ORDER — TETANUS-DIPHTH-ACELL PERTUSSIS 5-2.5-18.5 LF-MCG/0.5 IM SUSP: 0.5000 mL | Freq: Once | INTRAMUSCULAR | Status: DC

## 2011-05-26 MED ORDER — FUROSEMIDE 40 MG PO TABS: 40.0000 mg | ORAL_TABLET | Freq: Every day | ORAL | Status: DC | PRN

## 2011-05-26 MED ORDER — ZOSTER VACCINE LIVE 19400 UNT/0.65ML ~~LOC~~ SOLR: 0.6500 mL | Freq: Once | SUBCUTANEOUS | Status: DC

## 2011-05-26 NOTE — Progress Notes (Signed)
Subjective:    [name] is a 77 y.o. female who presents for Medicare Annual/Subsequent preventive examination.  Preventive Screening-Counseling & Management  Tobacco History  Smoking status  . Former Smoker  Smokeless tobacco  . Not on file     Problems Prior to Visit 1.   Current Problems (verified) Patient Active Problem List  Diagnoses  . HYPOTHYROIDISM  . HYPERLIPIDEMIA  . HYPERCALCEMIA  . Unspecified Anemia  . ANXIETY  . DEPRESSION  . MYOCARDIAL INFARCTION, HX OF  . BLOOD IN STOOL  . UTI  . ARTIFICIAL MENOPAUSE  . WEAKNESS  . NONSPECIFIC ABNORM RESULTS KIDNEY FUNCTION STUDY    Medications Prior to Visit Current Outpatient Prescriptions on File Prior to Visit  Medication Sig Dispense Refill  . aspirin 81 MG EC tablet Take 81 mg by mouth daily.        . Cholecalciferol (VITAMIN D3) 1000 UNITS CAPS Take by mouth daily.        . ferrous fumarate (FERRETTS) 325 (106 FE) MG TABS Take by mouth.        . folic acid (FOLVITE) 1 MG tablet Take 1 mg by mouth daily.        . metoCLOPramide (REGLAN) 10 MG tablet Take 10 mg by mouth 3 (three) times daily.        . promethazine (PHENERGAN) 25 MG tablet Take 25 mg by mouth every 6 (six) hours as needed.        . temazepam (RESTORIL) 30 MG capsule Take 1 capsule (30 mg total) by mouth at bedtime as needed.  90 capsule  0  . tiZANidine (ZANAFLEX) 2 MG tablet Take 2 mg by mouth 3 (three) times daily.        Marland Kitchen DISCONTD: furosemide (LASIX) 40 MG tablet Take 40 mg by mouth daily as needed.        Marland Kitchen DISCONTD: levothyroxine (SYNTHROID) 25 MCG tablet Take 25 mcg by mouth daily.        Marland Kitchen DISCONTD: rOPINIRole (REQUIP) 1 MG tablet Take 1 mg by mouth at bedtime.        Marland Kitchen DISCONTD: simvastatin (ZOCOR) 80 MG tablet Take 80 mg by mouth at bedtime.        Marland Kitchen DISCONTD: traMADol (ULTRAM) 50 MG tablet Take 50 mg by mouth every 6 (six) hours as needed.        Marland Kitchen Epoetin Alfa (PROCRIT IJ) Inject as directed.          Current Medications  (verified) Current Outpatient Prescriptions  Medication Sig Dispense Refill  . ALPRAZolam (XANAX) 0.5 MG tablet 1 po bid prn  180 tablet  0  . aspirin 81 MG EC tablet Take 81 mg by mouth daily.        . Cholecalciferol (VITAMIN D3) 1000 UNITS CAPS Take by mouth daily.        . ferrous fumarate (FERRETTS) 325 (106 FE) MG TABS Take by mouth.        . folic acid (FOLVITE) 1 MG tablet Take 1 mg by mouth daily.        . furosemide (LASIX) 40 MG tablet Take 1 tablet (40 mg total) by mouth daily as needed.  90 tablet  3  . levothyroxine (SYNTHROID) 25 MCG tablet Take 1 tablet (25 mcg total) by mouth daily.  90 tablet  3  . metoCLOPramide (REGLAN) 10 MG tablet Take 10 mg by mouth 3 (three) times daily.        . promethazine (PHENERGAN) 25  MG tablet Take 25 mg by mouth every 6 (six) hours as needed.        Marland Kitchen rOPINIRole (REQUIP) 1 MG tablet Take 1 tablet (1 mg total) by mouth at bedtime.  90 tablet  3  . simvastatin (ZOCOR) 80 MG tablet Take 1 tablet (80 mg total) by mouth at bedtime.  90 tablet  3  . temazepam (RESTORIL) 30 MG capsule Take 1 capsule (30 mg total) by mouth at bedtime as needed.  90 capsule  0  . tiZANidine (ZANAFLEX) 2 MG tablet Take 2 mg by mouth 3 (three) times daily.        . traMADol (ULTRAM) 50 MG tablet Take 1 tablet (50 mg total) by mouth every 6 (six) hours as needed.  270 tablet  0  . DISCONTD: furosemide (LASIX) 40 MG tablet Take 40 mg by mouth daily as needed.        Marland Kitchen DISCONTD: furosemide (LASIX) 40 MG tablet Take 1 tablet (40 mg total) by mouth daily as needed.  90 tablet  3  . DISCONTD: levothyroxine (SYNTHROID) 25 MCG tablet Take 25 mcg by mouth daily.        Marland Kitchen DISCONTD: levothyroxine (SYNTHROID) 25 MCG tablet Take 1 tablet (25 mcg total) by mouth daily.  90 tablet  3  . DISCONTD: rOPINIRole (REQUIP) 1 MG tablet Take 1 mg by mouth at bedtime.        Marland Kitchen DISCONTD: rOPINIRole (REQUIP) 1 MG tablet Take 1 tablet (1 mg total) by mouth at bedtime.  90 tablet  3  . DISCONTD:  simvastatin (ZOCOR) 80 MG tablet Take 80 mg by mouth at bedtime.        Marland Kitchen DISCONTD: simvastatin (ZOCOR) 80 MG tablet Take 1 tablet (80 mg total) by mouth at bedtime.  90 tablet  3  . DISCONTD: traMADol (ULTRAM) 50 MG tablet Take 50 mg by mouth every 6 (six) hours as needed.        Marland Kitchen DISCONTD: traMADol (ULTRAM) 50 MG tablet Take 1 tablet (50 mg total) by mouth every 6 (six) hours as needed.  270 tablet  0  . Epoetin Alfa (PROCRIT IJ) Inject as directed.        . fluocinonide (LIDEX) 0.05 % cream       . traZODone (DESYREL) 50 MG tablet Take 1 tablet (50 mg total) by mouth at bedtime.  30 tablet  0     Allergies (verified) Clarithromycin; Codeine; Diazepam; Sulfonamide derivatives; and Prilosec   PAST HISTORY  Family History Family History  Problem Relation Age of Onset  . Parkinsonism Father   . Cancer Other     breast 1st degree relative  . Hypertension Other   . Heart disease Other     CAD.Marland Kitchen1ST DEGREE RELATIVE <60  . Hyperlipidemia Other     Social History History  Substance Use Topics  . Smoking status: Former Games developer  . Smokeless tobacco: Not on file  . Alcohol Use: No     Are there smokers in your home (other than you)? No  Risk Factors Current exercise habits: Exercise is limited by orthopedic condition(s): oa.  Dietary issues discussed: needs to eat more protein  Cardiac risk factors: advanced age (older than 24 for men, 3 for women), dyslipidemia and hypertension.  Depression Screen (Note: if answer to either of the following is "Yes", a more complete depression screening is indicated)   Over the past two weeks, have you felt down, depressed or hopeless? No  Over the  past two weeks, have you felt little interest or pleasure in doing things? No  Have you lost interest or pleasure in daily life? No  Do you often feel hopeless? No  Do you cry easily over simple problems? No  Activities of Daily Living In your present state of health, do you have any difficulty  performing the following activities?:  Driving? Yes Managing money?  no Feeding yourself? No Getting from bed to chair? No{exam Climbing a flight of stairs? Yes Preparing food and eating?: No Bathing or showering? No Getting dressed: No Getting to the toilet? No Using the toilet:No Moving around from place to place: No In the past year have you fallen or had a near fall?:No   Are you sexually active?  No  Do you have more than one partner?  No  Hearing Difficulties: Yes Do you often ask people to speak up or repeat themselves? Yes Do you experience ringing or noises in your ears? Yes Do you have difficulty understanding soft or whispered voices? Yes   Do you feel that you have a problem with memory? No  Do you often misplace items? No  Do you feel safe at home?  Yes  Cognitive Testing  Alert? Yes  Normal Appearance?Yes  Oriented to person? Yes  Place? Yes   Time? Yes  Recall of three objects?  Yes  Can perform simple calculations? Yes  Displays appropriate judgment?Yes  Can read the correct time from a watch face?Yes   Advanced Directives have been discussed with the patient? Yes  List the Names of Other Physician/Practitioners you currently use: 1.  Derm--Jones,  [place] 2. opth-- Hecker 3. Hem-- sherrill 4. Dentist--jewson 5  Cardio-- Hochrein  Indicate any recent Medical Services you may have received from other than [facility] providers in the past year (date may be approximate).  Immunization History  Administered Date(s) Administered  . Influenza Whole 05/31/2008, 05/17/2009, 05/22/2010  . Pneumococcal Polysaccharide 06/11/2005    Screening Tests Health Maintenance  Topic Date Due  . Tetanus/tdap  04/21/1949  . Colonoscopy  04/21/1980  . Zostavax  04/21/1990  . Influenza Vaccine  05/26/2011  . Pneumococcal Polysaccharide Vaccine Age 47 And Over  Completed    All answers were reviewed with the patient and necessary referrals were made:  Loreen Freud,  DO   05/26/2011   History reviewed: allergies, current medications, past family history, past medical history, past social history, past surgical history and problem list  Review of Systems  Review of Systems  Constitutional: Negative for activity change, appetite change and fatigue.  HENT: Negative for hearing loss, congestion, tinnitus and ear discharge.  dentist-- q1y Eyes: Negative for visual disturbance (see optho q1y -- + cataracts) Respiratory: Negative for cough, chest tightness and shortness of breath.   Cardiovascular: Negative for chest pain, palpitations and leg swelling.  Gastrointestinal: Negative for abdominal pain, diarrhea, constipation and abdominal distention.  Genitourinary: Negative for urgency, frequency, decreased urine volume and difficulty urinating.  Musculoskeletal: Negative for back pain, arthralgias and gait problem.  Skin: Negative for color change, pallor and rash.  Neurological: Negative for dizziness, light-headedness, numbness and headaches.  Hematological: Negative for adenopathy. Does not bruise/bleed easily.  Psychiatric/Behavioral: Negative for suicidal ideas, confusion, sleep disturbance, self-injury, dysphoric mood, decreased concentration and agitation.  Pt is able to read and write and can do all ADLs No risk for falling No abuse/ violence in home     Objective:     Vision by Snellen chart: right ZOX:WRUEA, left  YNW:GNFAO  Body mass index is 14.69 kg/(m^2). BP 90/56  Pulse 81  Temp(Src) 98 F (36.7 C) (Oral)  Ht 5\' 4"  (1.626 m)  Wt 85 lb 9.6 oz (38.828 kg)  BMI 14.69 kg/m2  SpO2 98%  BP 90/56  Pulse 81  Temp(Src) 98 F (36.7 C) (Oral)  Ht 5\' 4"  (1.626 m)  Wt 85 lb 9.6 oz (38.828 kg)  BMI 14.69 kg/m2  SpO2 98% General appearance: alert, cooperative, appears stated age and no distress Head: Normocephalic, without obvious abnormality, atraumatic Eyes: negative findings: lids and lashes normal, conjunctivae and sclerae normal and  pupils equal, round, reactive to light and accomodation Ears: normal TM's and external ear canals both ears Nose: Nares normal. Septum midline. Mucosa normal. No drainage or sinus tenderness. Throat: lips, mucosa, and tongue normal; teeth and gums normal Neck: no adenopathy, no carotid bruit, no JVD, supple, symmetrical, trachea midline and thyroid not enlarged, symmetric, no tenderness/mass/nodules Back: +kyphosis,   Lungs: clear to auscultation bilaterally Breasts: normal appearance, no masses or tenderness Heart: regular rate and rhythm, S1, S2 normal, no murmur, click, rub or gallop Abdomen: soft, non-tender; bowel sounds normal; no masses,  no organomegaly Pelvic: not indicated; post-menopausal, no abnormal Pap smears in past Extremities: extremities normal, atraumatic, no cyanosis or edema Pulses: 2+ and symmetric Skin: Skin color, texture, turgor normal. No rashes or lesions Lymph nodes: Cervical, supraclavicular, and axillary nodes normal. Neurologic: Alert and oriented X 3, normal strength and tone. Normal symmetric reflexes. Normal coordination and gait     Assessment:     CPE     Plan:     During the course of the visit the patient was educated and counseled about appropriate screening and preventive services including:    Pneumococcal vaccine   Influenza vaccine  Td vaccine  Screening mammography  Bone densitometry screening  Advanced directives: has an advanced directive - a copy HAS NOT been provided.  Diet review for nutrition referral? Yes ____  Not Indicated _x___   Patient Instructions (the written plan) was given to the patient.  Medicare Attestation I have personally reviewed: The patient's medical and social history Their use of alcohol, tobacco or illicit drugs Their current medications and supplements The patient's functional ability including ADLs,fall risks, home safety risks, cognitive, and hearing and visual impairment Diet and physical  activities Evidence for depression or mood disorders  The patient's weight, height, BMI, and visual acuity have been recorded in the chart.  I have made referrals, counseling, and provided education to the patient based on review of the above and I have provided the patient with a written personalized care plan for preventive services.     Loreen Freud, DO   05/26/2011

## 2011-05-26 NOTE — Assessment & Plan Note (Signed)
Check labs con't meds 

## 2011-05-26 NOTE — Assessment & Plan Note (Signed)
F/u cardio con't meds

## 2011-05-26 NOTE — Patient Instructions (Signed)

## 2011-05-26 NOTE — Assessment & Plan Note (Signed)
restoril not workin Try trazadone ---if no difference---consider neuro for sleep eval

## 2011-05-26 NOTE — Assessment & Plan Note (Signed)
Xanax bid

## 2011-05-27 LAB — POCT I-STAT, CHEM 8
BUN: 46 — ABNORMAL HIGH
Chloride: 93 — ABNORMAL LOW
Creatinine, Ser: 3.9 — ABNORMAL HIGH
Glucose, Bld: 119 — ABNORMAL HIGH
Hemoglobin: 13.9
Potassium: 2.9 — ABNORMAL LOW
Sodium: 125 — ABNORMAL LOW

## 2011-05-27 LAB — CARDIAC PANEL(CRET KIN+CKTOT+MB+TROPI)
CK, MB: 5.1 — ABNORMAL HIGH
Total CK: 612 — ABNORMAL HIGH
Troponin I: 0.02
Troponin I: 0.03

## 2011-05-27 LAB — BASIC METABOLIC PANEL
BUN: 34 — ABNORMAL HIGH
CO2: 22
CO2: 22
CO2: 23
CO2: 25
Calcium: 11.8 — ABNORMAL HIGH
Chloride: 106
Chloride: 110
Chloride: 111
Creatinine, Ser: 1.93 — ABNORMAL HIGH
Creatinine, Ser: 2.05 — ABNORMAL HIGH
Creatinine, Ser: 2.73 — ABNORMAL HIGH
GFR calc Af Amer: 28 — ABNORMAL LOW
GFR calc Af Amer: 30 — ABNORMAL LOW
Glucose, Bld: 100 — ABNORMAL HIGH
Glucose, Bld: 88
Glucose, Bld: 98
Potassium: 2.9 — ABNORMAL LOW
Potassium: 3 — ABNORMAL LOW
Sodium: 136
Sodium: 139

## 2011-05-27 LAB — POCT CARDIAC MARKERS: Myoglobin, poc: >500

## 2011-05-27 LAB — DIFFERENTIAL
Lymphocytes Relative: 8 — ABNORMAL LOW
Lymphs Abs: 0.8
Monocytes Relative: 5
Neutro Abs: 9 — ABNORMAL HIGH
Neutrophils Relative %: 87 — ABNORMAL HIGH

## 2011-05-27 LAB — URINALYSIS, ROUTINE W REFLEX MICROSCOPIC
Bilirubin Urine: NEGATIVE
Specific Gravity, Urine: 1.014
Urobilinogen, UA: 0.2
pH: 5.5

## 2011-05-27 LAB — COMPREHENSIVE METABOLIC PANEL
Albumin: 2.7 — ABNORMAL LOW
Alkaline Phosphatase: 50
BUN: 41 — ABNORMAL HIGH
Creatinine, Ser: 3.13 — ABNORMAL HIGH
Glucose, Bld: 90
Total Bilirubin: 1
Total Protein: 5.1 — ABNORMAL LOW

## 2011-05-27 LAB — CBC
HCT: 33.4 — ABNORMAL LOW
HCT: 34.8 — ABNORMAL LOW
Hemoglobin: 11 — ABNORMAL LOW
Hemoglobin: 11.4 — ABNORMAL LOW
MCV: 101.1 — ABNORMAL HIGH
MCV: 101.2 — ABNORMAL HIGH
MCV: 101.4 — ABNORMAL HIGH
Platelets: 191
Platelets: 228
RBC: 3.29 — ABNORMAL LOW
RBC: 3.92
RDW: 14.3
WBC: 10.4
WBC: 5.3

## 2011-05-27 LAB — FERRITIN: Ferritin: 253 (ref 10–291)

## 2011-05-27 LAB — LIPID PANEL
LDL Cholesterol: 39
Total CHOL/HDL Ratio: 2.2
VLDL: 12

## 2011-05-27 LAB — CK TOTAL AND CKMB (NOT AT ARMC)
CK, MB: 11.9 — ABNORMAL HIGH
Relative Index: 0.7
Total CK: 1750 — ABNORMAL HIGH

## 2011-05-27 LAB — PROTIME-INR
INR: 1
Prothrombin Time: 12.9

## 2011-05-27 LAB — RETICULOCYTES
RBC.: 3.53 — ABNORMAL LOW
Retic Count, Absolute: 10.6 — ABNORMAL LOW
Retic Ct Pct: 0.3 — ABNORMAL LOW

## 2011-05-27 LAB — URINE CULTURE
Colony Count: NO GROWTH
Special Requests: NEGATIVE

## 2011-05-27 LAB — HEPATIC FUNCTION PANEL
ALT: 41 — ABNORMAL HIGH
Albumin: 2.5 — ABNORMAL LOW
Alkaline Phosphatase: 66
Indirect Bilirubin: 0.7
Total Bilirubin: 0.8
Total Protein: 4.6 — ABNORMAL LOW

## 2011-05-27 LAB — IRON AND TIBC
Iron: 35 — ABNORMAL LOW
Saturation Ratios: 20
TIBC: 175 — ABNORMAL LOW

## 2011-05-27 LAB — URINE MICROSCOPIC-ADD ON

## 2011-05-27 LAB — SODIUM, URINE, RANDOM: Sodium, Ur: 11

## 2011-05-27 LAB — OSMOLALITY, URINE: Osmolality, Ur: 201 — ABNORMAL LOW

## 2011-05-27 LAB — TROPONIN I: Troponin I: 0.04

## 2011-05-27 LAB — CORTISOL: Cortisol, Plasma: 22.6

## 2011-05-27 LAB — FOLATE: Folate: >20

## 2011-05-27 LAB — VITAMIN B12: Vitamin B-12: 1004 — ABNORMAL HIGH (ref 211–911)

## 2011-05-29 ENCOUNTER — Telehealth: Payer: Self-pay

## 2011-05-29 NOTE — Telephone Encounter (Signed)
Call from patient and she stated she got the Trazodone to help her sleep but read the side effects and she stated she is not suicidal and said it can cause insomnia and is afraid to take it . She stated if it was ok with you then she would take it but she wants to be sure this was not a mistake before she took it. Please advise   KP

## 2011-05-29 NOTE — Telephone Encounter (Signed)
We use it for sleep so it is ok to try

## 2011-05-29 NOTE — Telephone Encounter (Signed)
Discussed with patient and she stated if she had any concerns she would call back tomorrow     KP

## 2011-05-30 ENCOUNTER — Telehealth: Payer: Self-pay

## 2011-05-30 MED ORDER — TEMAZEPAM 30 MG PO CAPS: 30.0000 mg | ORAL_CAPSULE | Freq: Every evening | ORAL | Status: DC | PRN

## 2011-05-30 NOTE — Telephone Encounter (Signed)
Discussed with patient and he stated that she did not want to see a Neuro but she will discuss with her daughter and call back if her daughter felt it was necessary. Patient also stated that she will not take the RX ever night as recommended by Dr.Lowne and will follow up with an concerns.    KP

## 2011-05-30 NOTE — Telephone Encounter (Signed)
Call from patient and she stated she took the Trazodone last night and it did not work for her, she stated she had dizziness, shakiness, nausea and did not sleep at all. She wanted to know if she could just take the [facility], she said that did help her get a few hours of sleep. She wanted a 90 day supply sent to [facility]. Please advise     KP

## 2011-05-30 NOTE — Telephone Encounter (Signed)
I prefer she not take it every day----we discussed her sleeping issues during office visit---- I would like to refer her to neuro for sleep eval-----  #45 pills only

## 2011-06-08 ENCOUNTER — Emergency Department (HOSPITAL_COMMUNITY)
Admission: EM | Admit: 2011-06-08 | Discharge: 2011-06-08 | Disposition: A | Discharge status: Home / Self Care | Payer: MEDICARE | Attending: Emergency Medicine | Admitting: Emergency Medicine

## 2011-06-08 ENCOUNTER — Emergency Department (HOSPITAL_COMMUNITY): Payer: MEDICARE

## 2011-06-08 DIAGNOSIS — I25810 Atherosclerosis of coronary artery bypass graft(s) without angina pectoris: Secondary | ICD-10-CM | POA: Insufficient documentation

## 2011-06-08 DIAGNOSIS — S42209A Unspecified fracture of upper end of unspecified humerus, initial encounter for closed fracture: Secondary | ICD-10-CM | POA: Insufficient documentation

## 2011-06-08 DIAGNOSIS — M25519 Pain in unspecified shoulder: Secondary | ICD-10-CM | POA: Insufficient documentation

## 2011-06-08 DIAGNOSIS — W010XXA Fall on same level from slipping, tripping and stumbling without subsequent striking against object, initial encounter: Secondary | ICD-10-CM | POA: Insufficient documentation

## 2011-06-09 ENCOUNTER — Telehealth: Payer: Self-pay | Admitting: Family Medicine

## 2011-06-09 NOTE — Telephone Encounter (Signed)
Tried to call Pt line busy will try again later. 

## 2011-06-09 NOTE — Telephone Encounter (Signed)
She wants a cane for a broken arm?  I need more details.

## 2011-06-09 NOTE — Telephone Encounter (Signed)
Left message to call office

## 2011-06-09 NOTE — Telephone Encounter (Signed)
Patient broke left shoulder - she has an appt today at 4:00 with ortho - daughter wants rx for a cane - [facility] so she can pick it up before appt -call daughter when faxed

## 2011-06-11 NOTE — Telephone Encounter (Signed)
Left message to call office

## 2011-06-16 ENCOUNTER — Encounter: Payer: Self-pay | Admitting: *Deleted

## 2011-06-16 NOTE — Telephone Encounter (Signed)
Left message to call office, Letter Mail after several attempts to contact Pt. 

## 2011-06-18 ENCOUNTER — Other Ambulatory Visit: Payer: Self-pay | Admitting: Orthopedic Surgery

## 2011-06-18 DIAGNOSIS — M25519 Pain in unspecified shoulder: Secondary | ICD-10-CM

## 2011-06-20 ENCOUNTER — Other Ambulatory Visit: Payer: MEDICARE

## 2011-06-20 ENCOUNTER — Ambulatory Visit
Admission: RE | Admit: 2011-06-20 | Discharge: 2011-06-20 | Disposition: A | Discharge status: Home / Self Care | Payer: MEDICARE | Source: Ambulatory Visit | Attending: Orthopedic Surgery | Admitting: Orthopedic Surgery

## 2011-06-20 DIAGNOSIS — M25519 Pain in unspecified shoulder: Secondary | ICD-10-CM

## 2011-08-01 ENCOUNTER — Other Ambulatory Visit: Payer: Self-pay | Admitting: Oncology

## 2011-08-01 ENCOUNTER — Ambulatory Visit (HOSPITAL_BASED_OUTPATIENT_CLINIC_OR_DEPARTMENT_OTHER): Payer: MEDICARE | Admitting: Oncology

## 2011-08-01 ENCOUNTER — Other Ambulatory Visit (HOSPITAL_BASED_OUTPATIENT_CLINIC_OR_DEPARTMENT_OTHER): Payer: MEDICARE | Admitting: Lab

## 2011-08-01 VITALS — BP 98/59 | HR 62 | Temp 97.0°F | Ht 63.5 in | Wt 82.8 lb

## 2011-08-01 DIAGNOSIS — D649 Anemia, unspecified: Secondary | ICD-10-CM

## 2011-08-01 DIAGNOSIS — Z8679 Personal history of other diseases of the circulatory system: Secondary | ICD-10-CM

## 2011-08-01 DIAGNOSIS — N289 Disorder of kidney and ureter, unspecified: Secondary | ICD-10-CM

## 2011-08-01 LAB — CBC WITH DIFFERENTIAL/PLATELET
Basophils Absolute: 0 10*3/uL (ref 0.0–0.1)
Eosinophils Absolute: 0 10*3/uL (ref 0.0–0.5)
HGB: 12.7 g/dL (ref 11.6–15.9)
LYMPH%: 25.9 % (ref 14.0–49.7)
MCV: 96.3 fL (ref 79.5–101.0)
MONO%: 8.4 % (ref 0.0–14.0)
NEUT#: 3.7 10*3/uL (ref 1.5–6.5)
NEUT%: 64.9 % (ref 38.4–76.8)
Platelets: 276 10*3/uL (ref 145–400)

## 2011-08-01 NOTE — Progress Notes (Signed)
OFFICE PROGRESS NOTE   INTERVAL HISTORY:   She returns as scheduled. She feels well. She relates weight loss to a change in her diet. She recently fell and fractured the left humerus. The arm remains in a sling and she is followed by Dr. Amanda Pea.  She has no other complaint.  Objective:  Vital signs in last 24 hours:  Blood pressure 98/59, pulse 62, temperature 97 F (36.1 C), temperature source Oral, height 5' 3.5" (1.613 m), weight 82 lb 12.8 oz (37.558 kg).    HEENT: Neck without mass Lymphatics: No cervical, clavicular, or axillary nodes. "shotty "bilateral inguinal nodes Resp: Lungs clear bilaterally Cardio: Regular rate and rhythm GI: No hepatosplenomegaly Vascular: No leg edema     Lab Results:  Lab Results  Component Value Date   WBC 5.7 08/01/2011   HGB 12.7 08/01/2011   HCT 37.8 08/01/2011   MCV 96.3 08/01/2011   PLT 276 08/01/2011      Medications: I have reviewed the patient's current medications.  Assessment/Plan: 1. History of anemia secondary to renal insufficiency - the hemoglobin remains in the normal range.  She remains off of erythropoietin therapy. 2. Renal insufficiency. 3. History of an elevated serum calcium level - the calcium was normal on April 25, 2008. 4. History of coronary artery disease. 5. Status post a bone marrow aspirate and biopsy on April 18, 2008 - non-diagnostic. 6. Recent fall with rib fractures. 7. Hemoccult positive stool - scheduled for a colonoscopy September 06, 2010. The patient and her daughter report she underwent a negative colonoscopy earlier this year. 8. Recent fall with a left humerus fracture 9. Weight loss-I have a low clinical suspicion for a malignancy. She will continue followup with Dr. Laury Axon   Disposition:  She appears stable from a hematologic standpoint. She would like to be discharged from the hematology clinic and continue followup with Dr. Laury Axon. The hemoglobin has been stable over the past year. We did  not schedule a followup appointment in the hematology clinic. I will see her in the future as needed.   Lucile Shutters, MD  08/01/2011  12:11 PM

## 2011-08-27 ENCOUNTER — Other Ambulatory Visit: Payer: Self-pay | Admitting: Family Medicine

## 2011-08-27 DIAGNOSIS — F419 Anxiety disorder, unspecified: Secondary | ICD-10-CM

## 2011-08-27 DIAGNOSIS — M1990 Unspecified osteoarthritis, unspecified site: Secondary | ICD-10-CM

## 2011-08-27 MED ORDER — TRAMADOL HCL 50 MG PO TABS: 50.0000 mg | ORAL_TABLET | Freq: Four times a day (QID) | ORAL | Status: DC | PRN

## 2011-08-27 MED ORDER — ALPRAZOLAM 0.5 MG PO TABS: ORAL_TABLET | ORAL | Status: DC

## 2011-08-27 MED ORDER — TEMAZEPAM 30 MG PO CAPS: 30.0000 mg | ORAL_CAPSULE | Freq: Every evening | ORAL | Status: DC | PRN

## 2011-08-27 NOTE — Telephone Encounter (Signed)
Addended by: Arnette Norris on: 08/27/2011 06:23 PM   Modules accepted: Orders

## 2011-08-27 NOTE — Telephone Encounter (Addendum)
Last seen and filled 05/26/11.     Please advise     KP  Patient requested Alprazolam and Tramadol be sent to [facility] and Trazodone be sent to [facility].

## 2011-09-17 DIAGNOSIS — S42293A Other displaced fracture of upper end of unspecified humerus, initial encounter for closed fracture: Secondary | ICD-10-CM | POA: Diagnosis not present

## 2011-09-23 DIAGNOSIS — S42293A Other displaced fracture of upper end of unspecified humerus, initial encounter for closed fracture: Secondary | ICD-10-CM | POA: Diagnosis not present

## 2011-09-26 DIAGNOSIS — S42293A Other displaced fracture of upper end of unspecified humerus, initial encounter for closed fracture: Secondary | ICD-10-CM | POA: Diagnosis not present

## 2011-09-29 DIAGNOSIS — S42293A Other displaced fracture of upper end of unspecified humerus, initial encounter for closed fracture: Secondary | ICD-10-CM | POA: Diagnosis not present

## 2011-10-02 DIAGNOSIS — H1045 Other chronic allergic conjunctivitis: Secondary | ICD-10-CM | POA: Diagnosis not present

## 2011-10-02 DIAGNOSIS — H2510 Age-related nuclear cataract, unspecified eye: Secondary | ICD-10-CM | POA: Diagnosis not present

## 2011-10-02 DIAGNOSIS — H538 Other visual disturbances: Secondary | ICD-10-CM | POA: Diagnosis not present

## 2011-10-02 DIAGNOSIS — H4720 Unspecified optic atrophy: Secondary | ICD-10-CM | POA: Diagnosis not present

## 2011-10-02 DIAGNOSIS — H40019 Open angle with borderline findings, low risk, unspecified eye: Secondary | ICD-10-CM | POA: Diagnosis not present

## 2011-10-03 DIAGNOSIS — S42293A Other displaced fracture of upper end of unspecified humerus, initial encounter for closed fracture: Secondary | ICD-10-CM | POA: Diagnosis not present

## 2011-10-06 DIAGNOSIS — S42293A Other displaced fracture of upper end of unspecified humerus, initial encounter for closed fracture: Secondary | ICD-10-CM | POA: Diagnosis not present

## 2011-10-10 DIAGNOSIS — S42293A Other displaced fracture of upper end of unspecified humerus, initial encounter for closed fracture: Secondary | ICD-10-CM | POA: Diagnosis not present

## 2011-10-13 DIAGNOSIS — S42293A Other displaced fracture of upper end of unspecified humerus, initial encounter for closed fracture: Secondary | ICD-10-CM | POA: Diagnosis not present

## 2011-10-14 ENCOUNTER — Telehealth: Payer: Self-pay | Admitting: *Deleted

## 2011-10-14 NOTE — Telephone Encounter (Signed)
Prior Auth approved 09-18-11 through 09-17-12, approval letter scan to chart.

## 2011-10-17 DIAGNOSIS — S42293A Other displaced fracture of upper end of unspecified humerus, initial encounter for closed fracture: Secondary | ICD-10-CM | POA: Diagnosis not present

## 2011-10-20 DIAGNOSIS — S42293A Other displaced fracture of upper end of unspecified humerus, initial encounter for closed fracture: Secondary | ICD-10-CM | POA: Diagnosis not present

## 2011-10-21 DIAGNOSIS — H04129 Dry eye syndrome of unspecified lacrimal gland: Secondary | ICD-10-CM | POA: Diagnosis not present

## 2011-10-21 DIAGNOSIS — H01009 Unspecified blepharitis unspecified eye, unspecified eyelid: Secondary | ICD-10-CM | POA: Diagnosis not present

## 2011-10-24 DIAGNOSIS — S42293A Other displaced fracture of upper end of unspecified humerus, initial encounter for closed fracture: Secondary | ICD-10-CM | POA: Diagnosis not present

## 2011-10-29 ENCOUNTER — Other Ambulatory Visit: Payer: Self-pay

## 2011-10-29 DIAGNOSIS — M1990 Unspecified osteoarthritis, unspecified site: Secondary | ICD-10-CM

## 2011-10-29 DIAGNOSIS — S42293A Other displaced fracture of upper end of unspecified humerus, initial encounter for closed fracture: Secondary | ICD-10-CM | POA: Diagnosis not present

## 2011-10-29 DIAGNOSIS — F419 Anxiety disorder, unspecified: Secondary | ICD-10-CM

## 2011-10-29 MED ORDER — ALPRAZOLAM 0.5 MG PO TABS: ORAL_TABLET | ORAL | Status: DC

## 2011-10-29 MED ORDER — TEMAZEPAM 30 MG PO CAPS: 30.0000 mg | ORAL_CAPSULE | Freq: Every evening | ORAL | Status: DC | PRN

## 2011-10-29 MED ORDER — TRAMADOL HCL 50 MG PO TABS: 50.0000 mg | ORAL_TABLET | Freq: Four times a day (QID) | ORAL | Status: DC | PRN

## 2011-10-29 NOTE — Telephone Encounter (Signed)
Patient called requesting prescriptions be sent to [facility]. Okay to refill?

## 2011-10-29 NOTE — Telephone Encounter (Signed)
Ok to refill x1 each----whomever is taking these messages needs to put last ov and last time filled.

## 2011-10-31 DIAGNOSIS — S42293A Other displaced fracture of upper end of unspecified humerus, initial encounter for closed fracture: Secondary | ICD-10-CM | POA: Diagnosis not present

## 2011-11-03 DIAGNOSIS — M949 Disorder of cartilage, unspecified: Secondary | ICD-10-CM | POA: Diagnosis not present

## 2011-11-03 DIAGNOSIS — D239 Other benign neoplasm of skin, unspecified: Secondary | ICD-10-CM | POA: Diagnosis not present

## 2011-11-03 DIAGNOSIS — M899 Disorder of bone, unspecified: Secondary | ICD-10-CM | POA: Diagnosis not present

## 2011-11-03 DIAGNOSIS — R928 Other abnormal and inconclusive findings on diagnostic imaging of breast: Secondary | ICD-10-CM | POA: Diagnosis not present

## 2011-11-03 DIAGNOSIS — Z1231 Encounter for screening mammogram for malignant neoplasm of breast: Secondary | ICD-10-CM | POA: Diagnosis not present

## 2011-11-03 DIAGNOSIS — L819 Disorder of pigmentation, unspecified: Secondary | ICD-10-CM | POA: Diagnosis not present

## 2011-11-03 DIAGNOSIS — L821 Other seborrheic keratosis: Secondary | ICD-10-CM | POA: Diagnosis not present

## 2011-11-05 ENCOUNTER — Other Ambulatory Visit: Payer: Self-pay | Admitting: Family Medicine

## 2011-11-05 ENCOUNTER — Telehealth: Payer: Self-pay | Admitting: Family Medicine

## 2011-11-05 NOTE — Telephone Encounter (Signed)
Patient's daughter, Bonita Quin, calling.  Patient has appointment with [facility] on Monday, 11-10-11, and they need a referral faxed to them (fax [phone]) because the patient has Medicare.  Will you please enter referral?

## 2011-11-05 NOTE — Telephone Encounter (Signed)
Order put in.

## 2011-11-06 MED ORDER — TEMAZEPAM 30 MG PO CAPS: 30.0000 mg | ORAL_CAPSULE | Freq: Every evening | ORAL | Status: DC | PRN

## 2011-11-06 MED ORDER — TRAMADOL HCL 50 MG PO TABS: 50.0000 mg | ORAL_TABLET | Freq: Four times a day (QID) | ORAL | Status: DC | PRN

## 2011-11-06 MED ORDER — ALPRAZOLAM 0.5 MG PO TABS: ORAL_TABLET | ORAL | Status: DC

## 2011-11-06 NOTE — Telephone Encounter (Signed)
Addended by: Candie Echevaria L on: 11/06/2011 12:03 PM   Modules accepted: Orders

## 2011-11-07 ENCOUNTER — Telehealth: Payer: Self-pay | Admitting: Family Medicine

## 2011-11-07 DIAGNOSIS — H40019 Open angle with borderline findings, low risk, unspecified eye: Secondary | ICD-10-CM | POA: Diagnosis not present

## 2011-11-07 DIAGNOSIS — H04129 Dry eye syndrome of unspecified lacrimal gland: Secondary | ICD-10-CM | POA: Diagnosis not present

## 2011-11-07 DIAGNOSIS — H02409 Unspecified ptosis of unspecified eyelid: Secondary | ICD-10-CM | POA: Diagnosis not present

## 2011-11-07 DIAGNOSIS — H01009 Unspecified blepharitis unspecified eye, unspecified eyelid: Secondary | ICD-10-CM | POA: Diagnosis not present

## 2011-11-07 NOTE — Telephone Encounter (Signed)
Referral has been faxed & fax confirmation [facility].  I have left detailed message of above for patient's daughter, Bonita Quin.

## 2011-11-07 NOTE — Telephone Encounter (Signed)
[name] called & stated she needs to get this referral paperwork picked up or faxed today. [name] is refusing to go to the appointment until she gets the referral because medicare will not pay for it. Can you please get this information ready & call [name] when its ready for pick up Ph# [phone]

## 2011-11-18 DIAGNOSIS — H538 Other visual disturbances: Secondary | ICD-10-CM | POA: Diagnosis not present

## 2011-11-18 DIAGNOSIS — H2510 Age-related nuclear cataract, unspecified eye: Secondary | ICD-10-CM | POA: Diagnosis not present

## 2011-11-18 DIAGNOSIS — H269 Unspecified cataract: Secondary | ICD-10-CM | POA: Diagnosis not present

## 2011-11-20 ENCOUNTER — Encounter: Payer: Self-pay | Admitting: Family Medicine

## 2011-12-19 ENCOUNTER — Telehealth: Payer: Self-pay

## 2011-12-19 NOTE — Telephone Encounter (Signed)
BMD-- Patient has Osteoporosis. Per Dr Laury Axon offer patient Re-clast or Prolia. I called and left a message to call the office.     KP

## 2011-12-22 NOTE — Telephone Encounter (Signed)
Discussed with patient and she voiced understanding, she is not taking any medication for her Osteoporosis and stated she could not take oral medications because they made her sick. Patient said she would be interested in either Prolia or Re-clast will speak with her daughter first and see which medication is covered. I will be mailing information on both medications and patient will call with her decision.      KP

## 2011-12-24 ENCOUNTER — Encounter: Payer: Self-pay | Admitting: Family Medicine

## 2011-12-25 DIAGNOSIS — H2510 Age-related nuclear cataract, unspecified eye: Secondary | ICD-10-CM | POA: Diagnosis not present

## 2011-12-31 ENCOUNTER — Telehealth: Payer: Self-pay | Admitting: *Deleted

## 2011-12-31 DIAGNOSIS — F419 Anxiety disorder, unspecified: Secondary | ICD-10-CM

## 2011-12-31 DIAGNOSIS — M1990 Unspecified osteoarthritis, unspecified site: Secondary | ICD-10-CM

## 2011-12-31 NOTE — Telephone Encounter (Signed)
Pt request nurse call back to discuss refill request to [facility]

## 2011-12-31 NOTE — Telephone Encounter (Signed)
Patient called to make me aware she will call me to schedule Re-clast or Prolia after she has her surgery. Needs refills on  Xanax, Tramadol and Temazepam - last seen 05/26/11 and filled 11/06/11. Please advise     KP

## 2012-01-01 MED ORDER — TRAMADOL HCL 50 MG PO TABS: 50.0000 mg | ORAL_TABLET | Freq: Four times a day (QID) | ORAL | Status: DC | PRN

## 2012-01-01 MED ORDER — TEMAZEPAM 30 MG PO CAPS: 30.0000 mg | ORAL_CAPSULE | Freq: Every evening | ORAL | Status: DC | PRN

## 2012-01-01 MED ORDER — ALPRAZOLAM 0.5 MG PO TABS: ORAL_TABLET | ORAL | Status: DC

## 2012-01-01 NOTE — Telephone Encounter (Signed)
Ok to send refill x1 only 

## 2012-01-01 NOTE — Telephone Encounter (Signed)
Faxed.   KP 

## 2012-01-06 DIAGNOSIS — H538 Other visual disturbances: Secondary | ICD-10-CM | POA: Diagnosis not present

## 2012-01-06 DIAGNOSIS — H2510 Age-related nuclear cataract, unspecified eye: Secondary | ICD-10-CM | POA: Diagnosis not present

## 2012-01-06 DIAGNOSIS — H269 Unspecified cataract: Secondary | ICD-10-CM | POA: Diagnosis not present

## 2012-03-09 DIAGNOSIS — Z961 Presence of intraocular lens: Secondary | ICD-10-CM | POA: Diagnosis not present

## 2012-03-09 DIAGNOSIS — H04129 Dry eye syndrome of unspecified lacrimal gland: Secondary | ICD-10-CM | POA: Diagnosis not present

## 2012-03-09 DIAGNOSIS — H40019 Open angle with borderline findings, low risk, unspecified eye: Secondary | ICD-10-CM | POA: Diagnosis not present

## 2012-04-02 ENCOUNTER — Telehealth: Payer: Self-pay | Admitting: *Deleted

## 2012-04-02 DIAGNOSIS — N6459 Other signs and symptoms in breast: Secondary | ICD-10-CM | POA: Diagnosis not present

## 2012-04-02 MED ORDER — CEPHALEXIN 500 MG PO CAPS: 500.0000 mg | ORAL_CAPSULE | Freq: Two times a day (BID) | ORAL | Status: AC

## 2012-04-02 NOTE — Telephone Encounter (Signed)
Keflex 500 mg 1 po bid for 10 days  #20

## 2012-04-02 NOTE — Telephone Encounter (Signed)
Msg left on triage vm from Dr. Rozanna Box office stating they saw the pt this morning & they have faxed over the results. Dr. Zane Herald thinks the pt has an infection & needs an antibiotic. Dr. Zane Herald has requested that Dr. Laury Axon prescribe the antibiotic & send to [facility] [address].

## 2012-04-02 NOTE — Telephone Encounter (Signed)
Results left on the ledge-- KP

## 2012-04-09 ENCOUNTER — Encounter: Payer: Self-pay | Admitting: Family Medicine

## 2012-05-26 ENCOUNTER — Ambulatory Visit (INDEPENDENT_AMBULATORY_CARE_PROVIDER_SITE_OTHER): Payer: MEDICARE | Admitting: Cardiovascular Disease

## 2012-05-26 ENCOUNTER — Encounter: Payer: Self-pay | Admitting: Cardiovascular Disease

## 2012-05-26 VITALS — BP 118/60 | HR 84 | Ht 64.0 in | Wt 93.8 lb

## 2012-05-26 DIAGNOSIS — I2510 Atherosclerotic heart disease of native coronary artery without angina pectoris: Secondary | ICD-10-CM

## 2012-05-26 DIAGNOSIS — R002 Palpitations: Secondary | ICD-10-CM | POA: Diagnosis not present

## 2012-05-26 NOTE — Patient Instructions (Addendum)
Your physician recommends that you continue on your current medications as directed. Please refer to the Current Medication list given to you today.   Your physician wants you to follow-up in: ONE YEAR WITH DR. Excell Seltzer.  You will receive a reminder letter in the mail two months in advance. If you don't receive a letter, please call our office to schedule the follow-up appointment.   Your physician has requested that you have an echocardiogram. DX:  PALPITATIONS  Echocardiography is a painless test that uses sound waves to create images of your heart. It provides your doctor with information about the size and shape of your heart and how well your heart's chambers and valves are working. This procedure takes approximately one hour. There are no restrictions for this procedure.

## 2012-05-26 NOTE — Progress Notes (Signed)
HPI:  78 year old [name] presenting for cardiac evaluation. The patient has coronary artery disease status post CABG in 2004.  Her lipids were last checked in October 2000 1209 demonstrated a cholesterol 146, trig was read 81, HDL 58, and LDL 72. Liver function tests were within normal limits.  The patient had an episode about one month ago when she felt her heart racing. This was associated with chest discomfort. She described a pressure-like feeling over the chest that was nonradiating. This lasted for about 48 hours. Her symptoms then resolved and she has had no further problems. She walks regularly about 20-25 minutes without exertional symptoms. She maintains her house and does regular housework without symptoms. She has a lot of gastroesophageal reflux symptoms in this episode 1 month ago had some characteristics of her reflux. She has not had recent lightheadedness or syncope. She's had about 3 episodes of syncope related to "dehydration" and she has been hospitalized for this problem. The last episode was approximately 3 years ago.  The patient denies orthopnea, PND, edema, or exertional chest pain, chest pressure, or dyspnea.  Outpatient Encounter Prescriptions as of 05/26/2012  Medication Sig Dispense Refill  . ALPRAZolam (XANAX) 0.5 MG tablet 1 po bid prn  180 tablet  0  . aspirin 81 MG EC tablet Take 81 mg by mouth daily.        . Cholecalciferol (VITAMIN D3) 1000 UNITS CAPS Take by mouth daily.        Marland Kitchen Epoetin Alfa (PROCRIT IJ) Inject as directed.        . ferrous fumarate (FERRETTS) 325 (106 FE) MG TABS Take by mouth.        . folic acid (FOLVITE) 1 MG tablet Take 1 mg by mouth daily.        . furosemide (LASIX) 40 MG tablet Take 1 tablet (40 mg total) by mouth daily as needed.  90 tablet  3  . levothyroxine (SYNTHROID) 25 MCG tablet Take 1 tablet (25 mcg total) by mouth daily.  90 tablet  3  . metoCLOPramide (REGLAN) 10 MG tablet Take 10 mg by mouth 3 (three) times daily.          . Multiple Vitamin (MULTIVITAMIN) tablet Take 1 tablet by mouth daily.        . promethazine (PHENERGAN) 25 MG tablet Take 1 tablet (25 mg total) by mouth every 6 (six) hours as needed.  30 tablet  2  . rOPINIRole (REQUIP) 1 MG tablet Take 1 tablet (1 mg total) by mouth at bedtime.  90 tablet  3  . simvastatin (ZOCOR) 80 MG tablet Take 1 tablet (80 mg total) by mouth at bedtime.  90 tablet  3  . TDaP (BOOSTRIX) 5-2.5-18.5 LF-MCG/0.5 injection Inject 0.5 mLs into the muscle once.  0.5 mL  0  . temazepam (RESTORIL) 30 MG capsule Take 1 capsule (30 mg total) by mouth at bedtime as needed.  90 capsule  0  . tiZANidine (ZANAFLEX) 2 MG tablet Take 2 mg by mouth 3 (three) times daily.        . traMADol (ULTRAM) 50 MG tablet Take 1 tablet (50 mg total) by mouth every 6 (six) hours as needed.  270 tablet  0  . temazepam (RESTORIL) 30 MG capsule Take 1 capsule (30 mg total) by mouth at bedtime as needed for sleep.  90 capsule  0  . DISCONTD: HYDROcodone-acetaminophen (VICODIN) 5-500 MG per tablet Take 1 tablet by mouth every 6 (six) hours as needed.        Marland Kitchen  DISCONTD: zoster vaccine live, PF, (ZOSTAVAX) 11914 UNT/0.65ML injection Inject 19,400 Units into the skin once.  1 vial  0    Clarithromycin; Codeine; Diazepam; Sulfonamide derivatives; and Prilosec  Past Medical History  Diagnosis Date  . Anxiety   . Depression   . Hyperlipidemia   . Thyroid disease     hypothyroidism  . Anemia   . Cataract 05/2011    Past Surgical History  Procedure Date  . Coronary artery bypass graft   . Obstructed ureter     operative repair  . Abdominal hysterectomy   . Total abdominal hysterectomy w/ bilateral salpingoophorectomy   . Kyphosis surgery   . Basal cell carcinoma excision   . Eye surgery 05/2011    History   Social History  . Marital Status: Single    Spouse Name: N/A    Number of Children: N/A  . Years of Education: N/A   Occupational History  . Not on file.   Social History Main  Topics  . Smoking status: Former Games developer  . Smokeless tobacco: Not on file  . Alcohol Use: No  . Drug Use: No  . Sexually Active: Not Currently   Other Topics Concern  . Not on file   Social History Narrative   REG EXERCISEWIDOWLIVES ALONEEND OF LIFE:PATIENT HAS LIVING WILL AND CLEARLY STATES SHE DOES NOT WANT CARDIAC RESUSCITATION,MECHANICLA VENTILATION OR OTHER HEROIC OR FUTILE MEASURES.    Family History  Problem Relation Age of Onset  . Parkinsonism Father   . Cancer Other     breast 1st degree relative  . Hypertension Other   . Heart disease Other     CAD.Marland Kitchen1ST DEGREE RELATIVE <60  . Hyperlipidemia Other     ROS: General: no fevers/chills/night sweats Eyes: no blurry vision, diplopia, or amaurosis ENT: no sore throat or hearing loss Resp: no cough, wheezing, or hemoptysis CV: no edema or palpitations GI: no abdominal pain, nausea, vomiting, diarrhea, or constipation GU: no dysuria, frequency, or hematuria Skin: no rash Neuro: no headache, numbness, tingling, or weakness of extremities Musculoskeletal: no joint pain or swelling Heme: no bleeding, DVT, or easy bruising Endo: no polydipsia or polyuria  BP 118/60  Pulse Lindsey  Ht 5\' 4"  (1.626 m)  Wt 42.547 kg (93 lb 12.8 oz)  BMI 16.10 kg/m2  PHYSICAL EXAM: Pt is alert and oriented, WD, WN, thin, elderly [name] in no distress. HEENT: normal Neck: JVP normal. Carotid upstrokes normal without bruits. No thyromegaly. Lungs: equal expansion, clear bilaterally CV: Apex is discrete and nondisplaced, RRR without murmur or gallop Abd: soft, NT, +BS, no bruit, no hepatosplenomegaly Back: no CVA tenderness Ext: no C/C/E        Femoral pulses 2+=         DP/PT pulses intact and = Skin: warm and dry without rash Neuro: CNII-XII intact             Strength intact = bilaterally  EKG:  Sinus rhythm with PACs, otherwise within normal limits. Heart rate Lindsey beats per minute.  ASSESSMENT AND PLAN: 1. Coronary atherosclerosis,  native vessel. Status post CABG 2004. We will send for her records from [place] so that we can understand her bypass graft anatomy. The patient is on appropriate medical therapy with aspirin for antiplatelet therapy and a statin drug for lipid-lowering. She has no symptoms of heart failure or angina.  2. Prolonged episode of palpitations with associated chest pain. She is at fairly high risk of atrial fibrillation considering her age, PACs  on 12-lead EKG, and history of CAD. She's had no further symptoms. I asked her to either come into the office or go to Dr. Estrellita Ludwig office if the symptoms recur so that an EKG can be done. Recommend that we check an echocardiogram to evaluate LV function and atrial size. Continue aspirin for now.  3. Hyperlipidemia. Lipids are excellent on current therapy.  For followup I would like to see the patient back in 12 months. She will call back if palpitations or chest pain occurs.  Tonny Bollman 05/26/2012 10:05 AM

## 2012-05-31 ENCOUNTER — Encounter: Payer: Self-pay | Admitting: Family Medicine

## 2012-05-31 ENCOUNTER — Ambulatory Visit (HOSPITAL_COMMUNITY): Payer: MEDICARE | Attending: Cardiology | Admitting: Radiology

## 2012-05-31 ENCOUNTER — Ambulatory Visit (INDEPENDENT_AMBULATORY_CARE_PROVIDER_SITE_OTHER): Payer: MEDICARE | Admitting: Family Medicine

## 2012-05-31 VITALS — BP 98/64 | HR 105 | Temp 98.1°F | Ht 64.0 in | Wt 90.4 lb

## 2012-05-31 DIAGNOSIS — R110 Nausea: Secondary | ICD-10-CM

## 2012-05-31 DIAGNOSIS — Z0000 Encounter for general adult medical examination without abnormal findings: Secondary | ICD-10-CM

## 2012-05-31 DIAGNOSIS — G2581 Restless legs syndrome: Secondary | ICD-10-CM

## 2012-05-31 DIAGNOSIS — R002 Palpitations: Secondary | ICD-10-CM | POA: Insufficient documentation

## 2012-05-31 DIAGNOSIS — E039 Hypothyroidism, unspecified: Secondary | ICD-10-CM | POA: Diagnosis not present

## 2012-05-31 DIAGNOSIS — I252 Old myocardial infarction: Secondary | ICD-10-CM | POA: Diagnosis not present

## 2012-05-31 DIAGNOSIS — I10 Essential (primary) hypertension: Secondary | ICD-10-CM

## 2012-05-31 DIAGNOSIS — M62838 Other muscle spasm: Secondary | ICD-10-CM

## 2012-05-31 DIAGNOSIS — M1990 Unspecified osteoarthritis, unspecified site: Secondary | ICD-10-CM

## 2012-05-31 DIAGNOSIS — F411 Generalized anxiety disorder: Secondary | ICD-10-CM

## 2012-05-31 DIAGNOSIS — Z23 Encounter for immunization: Secondary | ICD-10-CM

## 2012-05-31 DIAGNOSIS — F419 Anxiety disorder, unspecified: Secondary | ICD-10-CM

## 2012-05-31 DIAGNOSIS — I2510 Atherosclerotic heart disease of native coronary artery without angina pectoris: Secondary | ICD-10-CM | POA: Diagnosis not present

## 2012-05-31 DIAGNOSIS — E785 Hyperlipidemia, unspecified: Secondary | ICD-10-CM

## 2012-05-31 LAB — BASIC METABOLIC PANEL
CO2: 26 mEq/L (ref 19–32)
Calcium: 9.9 mg/dL (ref 8.4–10.5)
Chloride: 100 mEq/L (ref 96–112)
Glucose, Bld: 81 mg/dL (ref 70–99)
Potassium: 3 mEq/L — ABNORMAL LOW (ref 3.5–5.1)
Sodium: 136 mEq/L (ref 135–145)

## 2012-05-31 LAB — CBC WITH DIFFERENTIAL/PLATELET
Basophils Relative: 0.3 % (ref 0.0–3.0)
Eosinophils Absolute: 0 10*3/uL (ref 0.0–0.7)
HCT: 45.6 % (ref 36.0–46.0)
Hemoglobin: 14.9 g/dL (ref 12.0–15.0)
Lymphocytes Relative: 23.3 % (ref 12.0–46.0)
Lymphs Abs: 2.2 10*3/uL (ref 0.7–4.0)
MCHC: 32.6 g/dL (ref 30.0–36.0)
Neutro Abs: 6.4 10*3/uL (ref 1.4–7.7)
RBC: 4.71 Mil/uL (ref 3.87–5.11)

## 2012-05-31 LAB — POCT URINALYSIS DIPSTICK
Bilirubin, UA: NEGATIVE
Ketones, UA: NEGATIVE
Leukocytes, UA: NEGATIVE

## 2012-05-31 LAB — TSH: TSH: 4.53 u[IU]/mL (ref 0.35–5.50)

## 2012-05-31 LAB — HEPATIC FUNCTION PANEL
AST: 33 U/L (ref 0–37)
Albumin: 4 g/dL (ref 3.5–5.2)
Alkaline Phosphatase: 60 U/L (ref 39–117)
Total Protein: 7.4 g/dL (ref 6.0–8.3)

## 2012-05-31 LAB — LIPID PANEL
Cholesterol: 157 mg/dL (ref 0–200)
HDL: 57.7 mg/dL (ref >39.00)

## 2012-05-31 MED ORDER — ALPRAZOLAM 0.5 MG PO TABS: ORAL_TABLET | ORAL | Status: DC

## 2012-05-31 MED ORDER — TEMAZEPAM 30 MG PO CAPS: 30.0000 mg | ORAL_CAPSULE | Freq: Every evening | ORAL | Status: DC | PRN

## 2012-05-31 MED ORDER — ONDANSETRON 4 MG PO TBDP: 4.0000 mg | ORAL_TABLET | Freq: Three times a day (TID) | ORAL | Status: DC | PRN

## 2012-05-31 MED ORDER — FUROSEMIDE 40 MG PO TABS: 40.0000 mg | ORAL_TABLET | Freq: Every day | ORAL | Status: DC | PRN

## 2012-05-31 MED ORDER — LEVOTHYROXINE SODIUM 25 MCG PO TABS: 25.0000 ug | ORAL_TABLET | Freq: Every day | ORAL | Status: DC

## 2012-05-31 MED ORDER — TIZANIDINE HCL 2 MG PO TABS: 2.0000 mg | ORAL_TABLET | Freq: Three times a day (TID) | ORAL | Status: DC

## 2012-05-31 MED ORDER — METOCLOPRAMIDE HCL 10 MG PO TABS: 10.0000 mg | ORAL_TABLET | Freq: Three times a day (TID) | ORAL | Status: DC

## 2012-05-31 MED ORDER — ROPINIROLE HCL 1 MG PO TABS: 1.0000 mg | ORAL_TABLET | Freq: Every day | ORAL | Status: DC

## 2012-05-31 MED ORDER — TRAMADOL HCL 50 MG PO TABS: 50.0000 mg | ORAL_TABLET | Freq: Four times a day (QID) | ORAL | Status: DC | PRN

## 2012-05-31 MED ORDER — SIMVASTATIN 80 MG PO TABS: 80.0000 mg | ORAL_TABLET | Freq: Every day | ORAL | Status: DC

## 2012-05-31 NOTE — Progress Notes (Signed)
Subjective:    [name] is a 78 y.o. female who presents for Medicare Annual/Subsequent preventive examination.  Preventive Screening-Counseling & Management  Tobacco History  Smoking status  . Former Smoker  Smokeless tobacco  . Not on file     Problems Prior to Visit 1.   Current Problems (verified) Patient Active Problem List  Diagnosis  . HYPOTHYROIDISM  . HYPERLIPIDEMIA  . HYPERCALCEMIA  . Unspecified Anemia  . ANXIETY  . DEPRESSION  . MYOCARDIAL INFARCTION, HX OF  . BLOOD IN STOOL  . UTI  . ARTIFICIAL MENOPAUSE  . WEAKNESS  . NONSPECIFIC ABNORM RESULTS KIDNEY FUNCTION STUDY  . Insomnia  . Osteopenia    Medications Prior to Visit Current Outpatient Prescriptions on File Prior to Visit  Medication Sig Dispense Refill  . aspirin 81 MG EC tablet Take 81 mg by mouth daily.        . Cholecalciferol (VITAMIN D3) 1000 UNITS CAPS Take by mouth daily.        . ferrous fumarate (FERRETTS) 325 (106 FE) MG TABS Take by mouth.        . folic acid (FOLVITE) 1 MG tablet Take 1 mg by mouth daily.        . Multiple Vitamin (MULTIVITAMIN) tablet Take 1 tablet by mouth daily.        Marland Kitchen DISCONTD: furosemide (LASIX) 40 MG tablet Take 1 tablet (40 mg total) by mouth daily as needed.  90 tablet  3  . DISCONTD: levothyroxine (SYNTHROID) 25 MCG tablet Take 1 tablet (25 mcg total) by mouth daily.  90 tablet  3  . DISCONTD: metoCLOPramide (REGLAN) 10 MG tablet Take 10 mg by mouth 3 (three) times daily.        Marland Kitchen DISCONTD: rOPINIRole (REQUIP) 1 MG tablet Take 1 tablet (1 mg total) by mouth at bedtime.  90 tablet  3  . DISCONTD: simvastatin (ZOCOR) 80 MG tablet Take 1 tablet (80 mg total) by mouth at bedtime.  90 tablet  3  . DISCONTD: temazepam (RESTORIL) 30 MG capsule Take 1 capsule (30 mg total) by mouth at bedtime as needed.  90 capsule  0  . DISCONTD: traMADol (ULTRAM) 50 MG tablet Take 1 tablet (50 mg total) by mouth every 6 (six) hours as needed.  270 tablet  0  . temazepam  (RESTORIL) 30 MG capsule Take 1 capsule (30 mg total) by mouth at bedtime as needed for sleep.  90 capsule  0    Current Medications (verified) Current Outpatient Prescriptions  Medication Sig Dispense Refill  . ALPRAZolam (XANAX) 0.5 MG tablet 1 po bid prn  180 tablet  0  . aspirin 81 MG EC tablet Take 81 mg by mouth daily.        . Cholecalciferol (VITAMIN D3) 1000 UNITS CAPS Take by mouth daily.        . ferrous fumarate (FERRETTS) 325 (106 FE) MG TABS Take by mouth.        . folic acid (FOLVITE) 1 MG tablet Take 1 mg by mouth daily.        . furosemide (LASIX) 40 MG tablet Take 1 tablet (40 mg total) by mouth daily as needed.  90 tablet  3  . levothyroxine (SYNTHROID) 25 MCG tablet Take 1 tablet (25 mcg total) by mouth daily.  90 tablet  3  . metoCLOPramide (REGLAN) 10 MG tablet Take 1 tablet (10 mg total) by mouth 3 (three) times daily.  270 tablet  3  .  Multiple Vitamin (MULTIVITAMIN) tablet Take 1 tablet by mouth daily.        Marland Kitchen rOPINIRole (REQUIP) 1 MG tablet Take 1 tablet (1 mg total) by mouth at bedtime.  90 tablet  3  . simvastatin (ZOCOR) 80 MG tablet Take 1 tablet (80 mg total) by mouth at bedtime.  90 tablet  3  . temazepam (RESTORIL) 30 MG capsule Take 1 capsule (30 mg total) by mouth at bedtime as needed.  90 capsule  0  . tiZANidine (ZANAFLEX) 2 MG tablet Take 1 tablet (2 mg total) by mouth 3 (three) times daily.  270 tablet  0  . traMADol (ULTRAM) 50 MG tablet Take 1 tablet (50 mg total) by mouth every 6 (six) hours as needed.  270 tablet  3  . DISCONTD: furosemide (LASIX) 40 MG tablet Take 1 tablet (40 mg total) by mouth daily as needed.  90 tablet  3  . DISCONTD: levothyroxine (SYNTHROID) 25 MCG tablet Take 1 tablet (25 mcg total) by mouth daily.  90 tablet  3  . DISCONTD: metoCLOPramide (REGLAN) 10 MG tablet Take 10 mg by mouth 3 (three) times daily.        Marland Kitchen DISCONTD: rOPINIRole (REQUIP) 1 MG tablet Take 1 tablet (1 mg total) by mouth at bedtime.  90 tablet  3  .  DISCONTD: simvastatin (ZOCOR) 80 MG tablet Take 1 tablet (80 mg total) by mouth at bedtime.  90 tablet  3  . DISCONTD: temazepam (RESTORIL) 30 MG capsule Take 1 capsule (30 mg total) by mouth at bedtime as needed.  90 capsule  0  . DISCONTD: traMADol (ULTRAM) 50 MG tablet Take 1 tablet (50 mg total) by mouth every 6 (six) hours as needed.  270 tablet  0  . ondansetron (ZOFRAN-ODT) 4 MG disintegrating tablet Take 1 tablet (4 mg total) by mouth every 8 (eight) hours as needed for nausea.  180 tablet  0  . temazepam (RESTORIL) 30 MG capsule Take 1 capsule (30 mg total) by mouth at bedtime as needed for sleep.  90 capsule  0     Allergies (verified) Clarithromycin; Codeine; Diazepam; Sulfonamide derivatives; and Prilosec   PAST HISTORY  Family History Family History  Problem Relation Age of Onset  . Parkinsonism Father   . Cancer Other     breast 1st degree relative  . Hypertension Other   . Heart disease Other     CAD.Marland Kitchen1ST DEGREE RELATIVE <60  . Hyperlipidemia Other     Social History History  Substance Use Topics  . Smoking status: Former Games developer  . Smokeless tobacco: Not on file  . Alcohol Use: No     Are there smokers in your home (other than you)? No  Risk Factors Current exercise habits: walks 20 min daily  Dietary issues discussed: na   Cardiac risk factors: advanced age (older than 18 for men, 49 for women), dyslipidemia and hypertension.  Depression Screen (Note: if answer to either of the following is "Yes", a more complete depression screening is indicated)   Over the past two weeks, have you felt down, depressed or hopeless? No  Over the past two weeks, have you felt little interest or pleasure in doing things? No  Have you lost interest or pleasure in daily life? No  Do you often feel hopeless? No  Do you cry easily over simple problems? No  Activities of Daily Living In your present state of health, do you have any difficulty performing the following  activities?:  Driving? Yes Managing money?  No Feeding yourself? No Getting from bed to chair? No Climbing a flight of stairs? No Preparing food and eating?: No Bathing or showering? No Getting dressed: No Getting to the toilet? No Using the toilet:No Moving around from place to place: No In the past year have you fallen or had a near fall?:No   Are you sexually active?  No  Do you have more than one partner?  No  Hearing Difficulties: Yes Do you often ask people to speak up or repeat themselves? Yes Do you experience ringing or noises in your ears? No Do you have difficulty understanding soft or whispered voices? Yes   Do you feel that you have a problem with memory? No  Do you often misplace items? No  Do you feel safe at home?  Yes  Cognitive Testing  Alert? Yes  Normal Appearance?Yes  Oriented to person? Yes  Place? Yes   Time? Yes  Recall of three objects?  Yes  Can perform simple calculations? Yes  Displays appropriate judgment?Yes  Can read the correct time from a watch face?Yes   Advanced Directives have been discussed with the patient? Yes  List the Names of Other Physician/Practitioners you currently use: 1.  Cardio-- cooper 2 ortho-- gramig 3  Derm-- [place] 4opth-- hecker 5. Dentist-- Tresa Moore 6  GI --jacobs  Indicate any recent Medical Services you may have received from other than [facility] providers in the past year (date may be approximate).  Immunization History  Administered Date(s) Administered  . Influenza Split 05/26/2011  . Influenza Whole 05/31/2008, 05/17/2009, 05/22/2010  . Pneumococcal Polysaccharide 06/11/2005    Screening Tests Health Maintenance  Topic Date Due  . Tetanus/tdap  04/21/1947  . Mammogram  04/02/2013  . Influenza Vaccine  04/25/2013  . Colonoscopy  08/31/2020  . Pneumococcal Polysaccharide Vaccine Age 88 And Over  Completed  . Zostavax  Addressed    All answers were reviewed with the patient and necessary referrals were  made:  Loreen Freud, DO   05/31/2012   History reviewed:  She  has a past medical history of Anxiety; Depression; Hyperlipidemia; Thyroid disease; Anemia; and Cataract (05/2011). She  does not have any pertinent problems on file. She  has past surgical history that includes Coronary artery bypass graft; obstructed ureter; Abdominal hysterectomy; Total abdominal hysterectomy w/ bilateral salpingoophorectomy; Kyphosis surgery; Excision basal cell carcinoma; and Eye surgery (05/2011). Her family history includes Cancer in her other; Heart disease in her other; Hyperlipidemia in her other; Hypertension in her other; and Parkinsonism in her father. She  reports that she has quit smoking. She does not have any smokeless tobacco history on file. She reports that she does not drink alcohol or use illicit drugs. She has a current medication list which includes the following prescription(s): alprazolam, aspirin, vitamin d3, ferrous fumarate, folic acid, furosemide, levothyroxine, metoclopramide, multivitamin, ropinirole, simvastatin, temazepam, tizanidine, tramadol, ondansetron, and temazepam. Current Outpatient Prescriptions on File Prior to Visit  Medication Sig Dispense Refill  . aspirin 81 MG EC tablet Take 81 mg by mouth daily.        . Cholecalciferol (VITAMIN D3) 1000 UNITS CAPS Take by mouth daily.        . ferrous fumarate (FERRETTS) 325 (106 FE) MG TABS Take by mouth.        . folic acid (FOLVITE) 1 MG tablet Take 1 mg by mouth daily.        . Multiple Vitamin (MULTIVITAMIN) tablet Take 1 tablet by  mouth daily.        Marland Kitchen DISCONTD: furosemide (LASIX) 40 MG tablet Take 1 tablet (40 mg total) by mouth daily as needed.  90 tablet  3  . DISCONTD: levothyroxine (SYNTHROID) 25 MCG tablet Take 1 tablet (25 mcg total) by mouth daily.  90 tablet  3  . DISCONTD: metoCLOPramide (REGLAN) 10 MG tablet Take 10 mg by mouth 3 (three) times daily.        Marland Kitchen DISCONTD: rOPINIRole (REQUIP) 1 MG tablet Take 1 tablet (1  mg total) by mouth at bedtime.  90 tablet  3  . DISCONTD: simvastatin (ZOCOR) 80 MG tablet Take 1 tablet (80 mg total) by mouth at bedtime.  90 tablet  3  . DISCONTD: temazepam (RESTORIL) 30 MG capsule Take 1 capsule (30 mg total) by mouth at bedtime as needed.  90 capsule  0  . DISCONTD: traMADol (ULTRAM) 50 MG tablet Take 1 tablet (50 mg total) by mouth every 6 (six) hours as needed.  270 tablet  0  . temazepam (RESTORIL) 30 MG capsule Take 1 capsule (30 mg total) by mouth at bedtime as needed for sleep.  90 capsule  0   She is allergic to clarithromycin; codeine; diazepam; sulfonamide derivatives; and prilosec.  Review of Systems  Review of Systems  Constitutional: Negative for activity change, appetite change and fatigue.  HENT: Negative for hearing loss, congestion, tinnitus and ear discharge.   Eyes: Negative for visual disturbance (see optho q1y -- vision corrected to 20/20 with glasses).  Respiratory: Negative for cough, chest tightness and shortness of breath.   Cardiovascular: Negative for chest pain, palpitations and leg swelling.  Gastrointestinal: Negative for abdominal pain, diarrhea, constipation and abdominal distention.  Genitourinary: Negative for urgency, frequency, decreased urine volume and difficulty urinating.  Musculoskeletal: Negative for back pain, arthralgias and gait problem.  Skin: Negative for color change, pallor and rash.  Neurological: Negative for dizziness, light-headedness, numbness and headaches.  Hematological: Negative for adenopathy. Does not bruise/bleed easily.  Psychiatric/Behavioral: Negative for suicidal ideas, confusion, sleep disturbance, self-injury, dysphoric mood, decreased concentration and agitation.  Pt is able to read and write and can do all ADLs No risk for falling No abuse/ violence in home     Objective:     Vision by Snellen chart: opth Body mass index is 15.52 kg/(m^2). BP 98/64  Pulse 105  Temp 98.1 F (36.7 C) (Oral)   Ht 5\' 4"  (1.626 m)  Wt 90 lb 6.4 oz (41.005 kg)  BMI 15.52 kg/m2  SpO2 97%  BP 98/64  Pulse 105  Temp 98.1 F (36.7 C) (Oral)  Ht 5\' 4"  (1.626 m)  Wt 90 lb 6.4 oz (41.005 kg)  BMI 15.52 kg/m2  SpO2 97% General appearance: alert, cooperative, appears stated age and no distress Head: Normocephalic, without obvious abnormality, atraumatic Eyes: negative findings: lids and lashes normal, conjunctivae and sclerae normal and pupils equal, round, reactive to light and accomodation Ears: normal TM's and external ear canals both ears Nose: Nares normal. Septum midline. Mucosa normal. No drainage or sinus tenderness. Throat: lips, mucosa, and tongue normal; teeth and gums normal Neck: no adenopathy, supple, symmetrical, trachea midline and thyroid not enlarged, symmetric, no tenderness/mass/nodules Back: kyphosis Lungs: clear to auscultation bilaterally Breasts: normal appearance, no masses or tenderness Heart: S1, S2 normal Abdomen: soft, non-tender; bowel sounds normal; no masses,  no organomegaly Pelvic: deferred--gyn Extremities: extremities normal, atraumatic, no cyanosis or edema Pulses: 2+ and symmetric Skin: Skin color, texture, turgor normal. No  rashes or lesions Lymph nodes: Cervical, supraclavicular, and axillary nodes normal. Neurologic: Alert and oriented X 3, normal strength and tone. Normal symmetric reflexes. Normal coordination and gait Psych-- no anxiety , no depression     Assessment:     cpe     Plan:     During the course of the visit the patient was educated and counseled about appropriate screening and preventive services including:    Pneumococcal vaccine   Influenza vaccine  Td vaccine  Screening electrocardiogram  Screening mammography  Bone densitometry screening  Colorectal cancer screening  Diabetes screening  Glaucoma screening  Advanced directives: has an advanced directive - a copy HAS NOT been provided.  Diet review for  nutrition referral? Yes ____  Not Indicated ___x_   Patient Instructions (the written plan) was given to the patient.  Medicare Attestation I have personally reviewed: The patient's medical and social history Their use of alcohol, tobacco or illicit drugs Their current medications and supplements The patient's functional ability including ADLs,fall risks, home safety risks, cognitive, and hearing and visual impairment Diet and physical activities Evidence for depression or mood disorders  The patient's weight, height, BMI, and visual acuity have been recorded in the chart.  I have made referrals, counseling, and provided education to the patient based on review of the above and I have provided the patient with a written personalized care plan for preventive services.     Loreen Freud, DO   05/31/2012

## 2012-05-31 NOTE — Addendum Note (Signed)
Addended by: Arnette Norris on: 05/31/2012 01:47 PM   Modules accepted: Orders

## 2012-05-31 NOTE — Progress Notes (Signed)
Echocardiogram performed.  

## 2012-05-31 NOTE — Assessment & Plan Note (Signed)
Stable Seeing cardio

## 2012-05-31 NOTE — Assessment & Plan Note (Signed)
con't meds  Check labs 

## 2012-05-31 NOTE — Assessment & Plan Note (Signed)
Check labs 

## 2012-05-31 NOTE — Assessment & Plan Note (Signed)
con't meds stable 

## 2012-05-31 NOTE — Patient Instructions (Addendum)
Preventive Care for Adults, Female A healthy lifestyle and preventive care can promote health and wellness. Preventive health guidelines for women include the following key practices.  A routine yearly physical is a good way to check with your caregiver about your health and preventive screening. It is a chance to share any concerns and updates on your health, and to receive a thorough exam.  Visit your dentist for a routine exam and preventive care every 6 months. Brush your teeth twice a day and floss once a day. Good oral hygiene prevents tooth decay and gum disease.  The frequency of eye exams is based on your age, health, family medical history, use of contact lenses, and other factors. Follow your caregiver's recommendations for frequency of eye exams.  Eat a healthy diet. Foods like vegetables, fruits, whole grains, low-fat dairy products, and lean protein foods contain the nutrients you need without too many calories. Decrease your intake of foods high in solid fats, added sugars, and salt. Eat the right amount of calories for you.Get information about a proper diet from your caregiver, if necessary.  Regular physical exercise is one of the most important things you can do for your health. Most adults should get at least 150 minutes of moderate-intensity exercise (any activity that increases your heart rate and causes you to sweat) each week. In addition, most adults need muscle-strengthening exercises on 2 or more days a week.  Maintain a healthy weight. The body mass index (BMI) is a screening tool to identify possible weight problems. It provides an estimate of body fat based on height and weight. Your caregiver can help determine your BMI, and can help you achieve or maintain a healthy weight.For adults 20 years and older:  A BMI below 18.5 is considered underweight.  A BMI of 18.5 to 24.9 is normal.  A BMI of 25 to 29.9 is considered overweight.  A BMI of 30 and above is  considered obese.  Maintain normal blood lipids and cholesterol levels by exercising and minimizing your intake of saturated fat. Eat a balanced diet with plenty of fruit and vegetables. Blood tests for lipids and cholesterol should begin at age 20 and be repeated every 5 years. If your lipid or cholesterol levels are high, you are over 50, or you are at high risk for heart disease, you may need your cholesterol levels checked more frequently.Ongoing high lipid and cholesterol levels should be treated with medicines if diet and exercise are not effective.  If you smoke, find out from your caregiver how to quit. If you do not use tobacco, do not start.  If you are pregnant, do not drink alcohol. If you are breastfeeding, be very cautious about drinking alcohol. If you are not pregnant and choose to drink alcohol, do not exceed 1 drink per day. One drink is considered to be 12 ounces (355 mL) of beer, 5 ounces (148 mL) of wine, or 1.5 ounces (44 mL) of liquor.  Avoid use of street drugs. Do not share needles with anyone. Ask for help if you need support or instructions about stopping the use of drugs.  High blood pressure causes heart disease and increases the risk of stroke. Your blood pressure should be checked at least every 1 to 2 years. Ongoing high blood pressure should be treated with medicines if weight loss and exercise are not effective.  If you are 55 to 76 years old, ask your caregiver if you should take aspirin to prevent strokes.  Diabetes   screening involves taking a blood sample to check your fasting blood sugar level. This should be done once every 3 years, after age 45, if you are within normal weight and without risk factors for diabetes. Testing should be considered at a younger age or be carried out more frequently if you are overweight and have at least 1 risk factor for diabetes.  Breast cancer screening is essential preventive care for women. You should practice "breast  self-awareness." This means understanding the normal appearance and feel of your breasts and may include breast self-examination. Any changes detected, no matter how small, should be reported to a caregiver. Women in their 20s and 30s should have a clinical breast exam (CBE) by a caregiver as part of a regular health exam every 1 to 3 years. After age 40, women should have a CBE every year. Starting at age 40, women should consider having a mammography (breast X-ray test) every year. Women who have a family history of breast cancer should talk to their caregiver about genetic screening. Women at a high risk of breast cancer should talk to their caregivers about having magnetic resonance imaging (MRI) and a mammography every year.  The Pap test is a screening test for cervical cancer. A Pap test can show cell changes on the cervix that might become cervical cancer if left untreated. A Pap test is a procedure in which cells are obtained and examined from the lower end of the uterus (cervix).  Women should have a Pap test starting at age 21.  Between ages 21 and 29, Pap tests should be repeated every 2 years.  Beginning at age 30, you should have a Pap test every 3 years as long as the past 3 Pap tests have been normal.  Some women have medical problems that increase the chance of getting cervical cancer. Talk to your caregiver about these problems. It is especially important to talk to your caregiver if a new problem develops soon after your last Pap test. In these cases, your caregiver may recommend more frequent screening and Pap tests.  The above recommendations are the same for women who have or have not gotten the vaccine for human papillomavirus (HPV).  If you had a hysterectomy for a problem that was not cancer or a condition that could lead to cancer, then you no longer need Pap tests. Even if you no longer need a Pap test, a regular exam is a good idea to make sure no other problems are  starting.  If you are between ages 65 and 70, and you have had normal Pap tests going back 10 years, you no longer need Pap tests. Even if you no longer need a Pap test, a regular exam is a good idea to make sure no other problems are starting.  If you have had past treatment for cervical cancer or a condition that could lead to cancer, you need Pap tests and screening for cancer for at least 20 years after your treatment.  If Pap tests have been discontinued, risk factors (such as a new sexual partner) need to be reassessed to determine if screening should be resumed.  The HPV test is an additional test that may be used for cervical cancer screening. The HPV test looks for the virus that can cause the cell changes on the cervix. The cells collected during the Pap test can be tested for HPV. The HPV test could be used to screen women aged 30 years and older, and should   be used in women of any age who have unclear Pap test results. After the age of 30, women should have HPV testing at the same frequency as a Pap test.  Colorectal cancer can be detected and often prevented. Most routine colorectal cancer screening begins at the age of 50 and continues through age 75. However, your caregiver may recommend screening at an earlier age if you have risk factors for colon cancer. On a yearly basis, your caregiver may provide home test kits to check for hidden blood in the stool. Use of a small camera at the end of a tube, to directly examine the colon (sigmoidoscopy or colonoscopy), can detect the earliest forms of colorectal cancer. Talk to your caregiver about this at age 50, when routine screening begins. Direct examination of the colon should be repeated every 5 to 10 years through age 75, unless early forms of pre-cancerous polyps or small growths are found.  Hepatitis C blood testing is recommended for all people born from 1945 through 1965 and any individual with known risks for hepatitis C.  Practice  safe sex. Use condoms and avoid high-risk sexual practices to reduce the spread of sexually transmitted infections (STIs). STIs include gonorrhea, chlamydia, syphilis, trichomonas, herpes, HPV, and human immunodeficiency virus (HIV). Herpes, HIV, and HPV are viral illnesses that have no cure. They can result in disability, cancer, and death. Sexually active women aged 25 and younger should be checked for chlamydia. Older women with new or multiple partners should also be tested for chlamydia. Testing for other STIs is recommended if you are sexually active and at increased risk.  Osteoporosis is a disease in which the bones lose minerals and strength with aging. This can result in serious bone fractures. The risk of osteoporosis can be identified using a bone density scan. Women ages 65 and over and women at risk for fractures or osteoporosis should discuss screening with their caregivers. Ask your caregiver whether you should take a calcium supplement or vitamin D to reduce the rate of osteoporosis.  Menopause can be associated with physical symptoms and risks. Hormone replacement therapy is available to decrease symptoms and risks. You should talk to your caregiver about whether hormone replacement therapy is right for you.  Use sunscreen with sun protection factor (SPF) of 30 or more. Apply sunscreen liberally and repeatedly throughout the day. You should seek shade when your shadow is shorter than you. Protect yourself by wearing long sleeves, pants, a wide-brimmed hat, and sunglasses year round, whenever you are outdoors.  Once a month, do a whole body skin exam, using a mirror to look at the skin on your back. Notify your caregiver of new moles, moles that have irregular borders, moles that are larger than a pencil eraser, or moles that have changed in shape or color.  Stay current with required immunizations.  Influenza. You need a dose every fall (or winter). The composition of the flu vaccine  changes each year, so being vaccinated once is not enough.  Pneumococcal polysaccharide. You need 1 to 2 doses if you smoke cigarettes or if you have certain chronic medical conditions. You need 1 dose at age 65 (or older) if you have never been vaccinated.  Tetanus, diphtheria, pertussis (Tdap, Td). Get 1 dose of Tdap vaccine if you are younger than age 65, are over 65 and have contact with an infant, are a healthcare worker, are pregnant, or simply want to be protected from whooping cough. After that, you need a Td   booster dose every 10 years. Consult your caregiver if you have not had at least 3 tetanus and diphtheria-containing shots sometime in your life or have a deep or dirty wound.  HPV. You need this vaccine if you are a woman age 26 or younger. The vaccine is given in 3 doses over 6 months.  Measles, mumps, rubella (MMR). You need at least 1 dose of MMR if you were born in 1957 or later. You may also need a second dose.  Meningococcal. If you are age 19 to 21 and a first-year college student living in a residence hall, or have one of several medical conditions, you need to get vaccinated against meningococcal disease. You may also need additional booster doses.  Zoster (shingles). If you are age 60 or older, you should get this vaccine.  Varicella (chickenpox). If you have never had chickenpox or you were vaccinated but received only 1 dose, talk to your caregiver to find out if you need this vaccine.  Hepatitis A. You need this vaccine if you have a specific risk factor for hepatitis A virus infection or you simply wish to be protected from this disease. The vaccine is usually given as 2 doses, 6 to 18 months apart.  Hepatitis B. You need this vaccine if you have a specific risk factor for hepatitis B virus infection or you simply wish to be protected from this disease. The vaccine is given in 3 doses, usually over 6 months. Preventive Services / Frequency Ages 19 to 39  Blood  pressure check.** / Every 1 to 2 years.  Lipid and cholesterol check.** / Every 5 years beginning at age 20.  Clinical breast exam.** / Every 3 years for women in their 20s and 30s.  Pap test.** / Every 2 years from ages 21 through 29. Every 3 years starting at age 30 through age 65 or 70 with a history of 3 consecutive normal Pap tests.  HPV screening.** / Every 3 years from ages 30 through ages 65 to 70 with a history of 3 consecutive normal Pap tests.  Hepatitis C blood test.** / For any individual with known risks for hepatitis C.  Skin self-exam. / Monthly.  Influenza immunization.** / Every year.  Pneumococcal polysaccharide immunization.** / 1 to 2 doses if you smoke cigarettes or if you have certain chronic medical conditions.  Tetanus, diphtheria, pertussis (Tdap, Td) immunization. / A one-time dose of Tdap vaccine. After that, you need a Td booster dose every 10 years.  HPV immunization. / 3 doses over 6 months, if you are 26 and younger.  Measles, mumps, rubella (MMR) immunization. / You need at least 1 dose of MMR if you were born in 1957 or later. You may also need a second dose.  Meningococcal immunization. / 1 dose if you are age 19 to 21 and a first-year college student living in a residence hall, or have one of several medical conditions, you need to get vaccinated against meningococcal disease. You may also need additional booster doses.  Varicella immunization.** / Consult your caregiver.  Hepatitis A immunization.** / Consult your caregiver. 2 doses, 6 to 18 months apart.  Hepatitis B immunization.** / Consult your caregiver. 3 doses usually over 6 months. Ages 40 to 64  Blood pressure check.** / Every 1 to 2 years.  Lipid and cholesterol check.** / Every 5 years beginning at age 20.  Clinical breast exam.** / Every year after age 40.  Mammogram.** / Every year beginning at age 40   and continuing for as long as you are in good health. Consult with your  caregiver.  Pap test.** / Every 3 years starting at age 30 through age 65 or 70 with a history of 3 consecutive normal Pap tests.  HPV screening.** / Every 3 years from ages 30 through ages 65 to 70 with a history of 3 consecutive normal Pap tests.  Fecal occult blood test (FOBT) of stool. / Every year beginning at age 50 and continuing until age 75. You may not need to do this test if you get a colonoscopy every 10 years.  Flexible sigmoidoscopy or colonoscopy.** / Every 5 years for a flexible sigmoidoscopy or every 10 years for a colonoscopy beginning at age 50 and continuing until age 75.  Hepatitis C blood test.** / For all people born from 1945 through 1965 and any individual with known risks for hepatitis C.  Skin self-exam. / Monthly.  Influenza immunization.** / Every year.  Pneumococcal polysaccharide immunization.** / 1 to 2 doses if you smoke cigarettes or if you have certain chronic medical conditions.  Tetanus, diphtheria, pertussis (Tdap, Td) immunization.** / A one-time dose of Tdap vaccine. After that, you need a Td booster dose every 10 years.  Measles, mumps, rubella (MMR) immunization. / You need at least 1 dose of MMR if you were born in 1957 or later. You may also need a second dose.  Varicella immunization.** / Consult your caregiver.  Meningococcal immunization.** / Consult your caregiver.  Hepatitis A immunization.** / Consult your caregiver. 2 doses, 6 to 18 months apart.  Hepatitis B immunization.** / Consult your caregiver. 3 doses, usually over 6 months. Ages 65 and over  Blood pressure check.** / Every 1 to 2 years.  Lipid and cholesterol check.** / Every 5 years beginning at age 20.  Clinical breast exam.** / Every year after age 40.  Mammogram.** / Every year beginning at age 40 and continuing for as long as you are in good health. Consult with your caregiver.  Pap test.** / Every 3 years starting at age 30 through age 65 or 70 with a 3  consecutive normal Pap tests. Testing can be stopped between 65 and 70 with 3 consecutive normal Pap tests and no abnormal Pap or HPV tests in the past 10 years.  HPV screening.** / Every 3 years from ages 30 through ages 65 or 70 with a history of 3 consecutive normal Pap tests. Testing can be stopped between 65 and 70 with 3 consecutive normal Pap tests and no abnormal Pap or HPV tests in the past 10 years.  Fecal occult blood test (FOBT) of stool. / Every year beginning at age 50 and continuing until age 75. You may not need to do this test if you get a colonoscopy every 10 years.  Flexible sigmoidoscopy or colonoscopy.** / Every 5 years for a flexible sigmoidoscopy or every 10 years for a colonoscopy beginning at age 50 and continuing until age 75.  Hepatitis C blood test.** / For all people born from 1945 through 1965 and any individual with known risks for hepatitis C.  Osteoporosis screening.** / A one-time screening for women ages 65 and over and women at risk for fractures or osteoporosis.  Skin self-exam. / Monthly.  Influenza immunization.** / Every year.  Pneumococcal polysaccharide immunization.** / 1 dose at age 65 (or older) if you have never been vaccinated.  Tetanus, diphtheria, pertussis (Tdap, Td) immunization. / A one-time dose of Tdap vaccine if you are over   65 and have contact with an infant, are a healthcare worker, or simply want to be protected from whooping cough. After that, you need a Td booster dose every 10 years.  Varicella immunization.** / Consult your caregiver.  Meningococcal immunization.** / Consult your caregiver.  Hepatitis A immunization.** / Consult your caregiver. 2 doses, 6 to 18 months apart.  Hepatitis B immunization.** / Check with your caregiver. 3 doses, usually over 6 months. ** Family history and personal history of risk and conditions may change your caregiver's recommendations. Document Released: 10/07/2001 Document Revised: 11/03/2011  Document Reviewed: 01/06/2011 [employer] Patient Information 2013 [employer].  

## 2012-06-02 ENCOUNTER — Other Ambulatory Visit: Payer: Self-pay | Admitting: Family Medicine

## 2012-06-02 ENCOUNTER — Telehealth: Payer: Self-pay

## 2012-06-02 DIAGNOSIS — E876 Hypokalemia: Secondary | ICD-10-CM

## 2012-06-02 NOTE — Telephone Encounter (Signed)
Info given to office manager to arrange it to be put in chart.

## 2012-06-02 NOTE — Telephone Encounter (Signed)
Pt daughter states Power of [place] and DNR papers are up front with receptionist she wanted you to be aware. She wants this info. Updated in system.  Plz advise        MW

## 2012-06-03 ENCOUNTER — Telehealth: Payer: Self-pay | Admitting: Cardiovascular Disease

## 2012-06-03 ENCOUNTER — Telehealth: Payer: Self-pay

## 2012-06-03 MED ORDER — POTASSIUM CHLORIDE CRYS ER 20 MEQ PO TBCR: 20.0000 meq | EXTENDED_RELEASE_TABLET | Freq: Every day | ORAL | Status: DC

## 2012-06-03 NOTE — Telephone Encounter (Signed)
Pt's daughter checked My Chart and saw where she needed a potassium recheck next week, but could not schedule.  I scheduled her for next Friday, also she could not tell where the potassium rx was sent to.  I could not see where it had been sent.  She would like the rx to go to [facility].  And lastly she wanted to make sure Dr. Laury Axon had received her mom's DNR and POA forms.

## 2012-06-03 NOTE — Telephone Encounter (Signed)
lmtcb

## 2012-06-03 NOTE — Telephone Encounter (Signed)
pts dtr rtn call from yesterday, pt not sure what the call was regarding, but did have an echo recently, pls call dtr linda

## 2012-06-03 NOTE — Telephone Encounter (Signed)
Pt's dtr rtn call , pls call on cell

## 2012-06-03 NOTE — Telephone Encounter (Signed)
Pt / daughter called with normal echo results.

## 2012-06-04 ENCOUNTER — Telehealth: Payer: Self-pay | Admitting: Family Medicine

## 2012-06-04 ENCOUNTER — Encounter: Payer: Self-pay | Admitting: Family Medicine

## 2012-06-04 NOTE — Telephone Encounter (Signed)
Pt daughter calles she is POA I guess- she is calling and needs a call back

## 2012-06-04 NOTE — Telephone Encounter (Signed)
.  left message to have patient return my call to [facility] DPR for pt, advised closed til Monday at 8am

## 2012-06-07 NOTE — Telephone Encounter (Signed)
Spoke to pt daughter.  Advised no new labs/questions on our end that need her attention.  Only explanation may be that calls were crossed.  She is agreeable and voices understanding.

## 2012-06-11 ENCOUNTER — Other Ambulatory Visit (INDEPENDENT_AMBULATORY_CARE_PROVIDER_SITE_OTHER): Payer: MEDICARE

## 2012-06-11 DIAGNOSIS — E876 Hypokalemia: Secondary | ICD-10-CM | POA: Diagnosis not present

## 2012-06-11 LAB — BASIC METABOLIC PANEL
BUN: 12 mg/dL (ref 6–23)
CO2: 31 mEq/L (ref 19–32)
Chloride: 102 mEq/L (ref 96–112)
Creatinine, Ser: 0.8 mg/dL (ref 0.4–1.2)
Glucose, Bld: 85 mg/dL (ref 70–99)
Potassium: 3.5 mEq/L (ref 3.5–5.1)

## 2012-06-14 ENCOUNTER — Other Ambulatory Visit: Payer: Self-pay | Admitting: Family Medicine

## 2012-06-14 NOTE — Telephone Encounter (Signed)
refill Ondansetron TAB 4mg  #180 take 1 tablet by mouth everu 8 hours as needed for nausea last fill from fax requested states 10.21.13--last ov 10.7.13 V70 also note medication filled that day as well

## 2012-06-14 NOTE — Telephone Encounter (Signed)
Called [facility] and the Rx was received and being processed to be shipped out today per the pharmacist.     KP

## 2012-06-16 ENCOUNTER — Telehealth: Payer: Self-pay

## 2012-06-16 NOTE — Telephone Encounter (Signed)
Pt daughter called states she seen a missed call from [place] and got a message from [place]. I didn't see anything on Arman Bogus inbasket or on pt chart to advise so I advised her last notes I saw was about Rx refill 06/14/12 but we will call her back if needed. Pt stated understanding.        MW

## 2012-06-27 ENCOUNTER — Encounter: Payer: Self-pay | Admitting: Family Medicine

## 2012-06-28 ENCOUNTER — Encounter: Payer: Self-pay | Admitting: Family Medicine

## 2012-06-28 MED ORDER — POTASSIUM CHLORIDE CRYS ER 20 MEQ PO TBCR: 20.0000 meq | EXTENDED_RELEASE_TABLET | Freq: Every day | ORAL | Status: DC

## 2012-07-23 ENCOUNTER — Ambulatory Visit (HOSPITAL_BASED_OUTPATIENT_CLINIC_OR_DEPARTMENT_OTHER)
Admission: RE | Admit: 2012-07-23 | Discharge: 2012-07-23 | Disposition: A | Discharge status: Home / Self Care | Payer: MEDICARE | Source: Ambulatory Visit | Attending: Family Medicine | Admitting: Family Medicine

## 2012-07-23 ENCOUNTER — Telehealth: Payer: Self-pay

## 2012-07-23 ENCOUNTER — Encounter: Payer: Self-pay | Admitting: Family Medicine

## 2012-07-23 ENCOUNTER — Ambulatory Visit (INDEPENDENT_AMBULATORY_CARE_PROVIDER_SITE_OTHER): Payer: MEDICARE | Admitting: Family Medicine

## 2012-07-23 VITALS — BP 102/62 | HR 78 | Temp 98.3°F | Wt 95.0 lb

## 2012-07-23 DIAGNOSIS — J4489 Other specified chronic obstructive pulmonary disease: Secondary | ICD-10-CM | POA: Insufficient documentation

## 2012-07-23 DIAGNOSIS — J449 Chronic obstructive pulmonary disease, unspecified: Secondary | ICD-10-CM | POA: Insufficient documentation

## 2012-07-23 DIAGNOSIS — R059 Cough, unspecified: Secondary | ICD-10-CM | POA: Insufficient documentation

## 2012-07-23 DIAGNOSIS — R05 Cough: Secondary | ICD-10-CM | POA: Insufficient documentation

## 2012-07-23 DIAGNOSIS — T7840XA Allergy, unspecified, initial encounter: Secondary | ICD-10-CM

## 2012-07-23 DIAGNOSIS — Z888 Allergy status to other drugs, medicaments and biological substances status: Secondary | ICD-10-CM | POA: Diagnosis not present

## 2012-07-23 MED ORDER — PREDNISONE 10 MG PO TABS: ORAL_TABLET | ORAL | Status: DC

## 2012-07-23 NOTE — Telephone Encounter (Signed)
Discussed with patient and a detailed msg left on Daughter's voice mail.     KP

## 2012-07-23 NOTE — Telephone Encounter (Signed)
Pt daughter LMOVM stating her mom came today and they picked up Rx asking does her mom take all 3 pills at one time or through out the day? With a meal? Pt daughter stated pt won't take med until she is called and advised because she is scared of a medication reaction. PLz advise pt CB# [phone] MW

## 2012-07-23 NOTE — Progress Notes (Signed)
  Subjective:    Patient ID: [name], female    DOB: [birth year], 78 y.o.   MRN: [id number]  HPI Pt here because she had an allergic reaction to zofran about 1 month ago.  Her vision was affected, her voice became hoarse and her tongue and eyes swelled up.  She has difficulty swallowing --no cp, or sob. Pt is still Hoarse.   Review of Systems as above   Objective:   Physical Exam BP 102/62  Pulse 78  Temp 98.3 F (36.8 C) (Oral)  Wt 95 lb (43.092 kg)  SpO2 98% General appearance: alert, cooperative, appears stated age and no distress Ears: normal TM's and external ear canals both ears Nose: Nares normal. Septum midline. Mucosa normal. No drainage or sinus tenderness. Throat: lips, mucosa, and tongue normal; teeth and gums normal Neck: no adenopathy, supple, symmetrical, trachea midline and thyroid not enlarged, symmetric, no tenderness/mass/nodules Lungs: rhonchi occassionally with insp and no wheezing or rales Heart: S1, S2 normal       Assessment & Plan:

## 2012-07-23 NOTE — Patient Instructions (Signed)

## 2012-07-23 NOTE — Assessment & Plan Note (Signed)
con't antihistamine Check cxr pred taper To ENT if it does not resolve

## 2012-08-03 ENCOUNTER — Other Ambulatory Visit: Payer: Self-pay | Admitting: Family Medicine

## 2012-08-03 ENCOUNTER — Telehealth: Payer: Self-pay

## 2012-08-03 DIAGNOSIS — R490 Dysphonia: Secondary | ICD-10-CM

## 2012-08-03 DIAGNOSIS — T7840XA Allergy, unspecified, initial encounter: Secondary | ICD-10-CM

## 2012-08-03 NOTE — Telephone Encounter (Signed)
Please advise      KP 

## 2012-08-03 NOTE — Telephone Encounter (Signed)
How quickly can we get an ENT referral?--- she does not have inhaler on her med list and I really don't want to give her more steroids.---- I may need to see her again if we can not get her in quickly.

## 2012-08-03 NOTE — Telephone Encounter (Signed)
Message copied by Arnette Norris on Tue Aug 03, 2012  2:53 PM ------      Message from: [place]      Created: Tue Aug 03, 2012 10:33 AM      Contact: Tawni Levy, daughter       Daughter states pt had an appt on 11/29 and was given a prednisone dose pack. Dtr states it worked at high doses then as dose decreased so did its effectiveness. Dtr also said there a mention of ENT referral if no significant improvement in sx. Pts voice is raspy again adn wants to know if she can get another prednisone dose pack or inhaler until ENT referral.  CB # [phone]

## 2012-08-03 NOTE — Telephone Encounter (Signed)
Patient can get off on 12/19, 12/20 , 12/26 and 12/27. She would like to have the apt scheduled around any of these days.       KP

## 2012-08-04 ENCOUNTER — Telehealth: Payer: Self-pay | Admitting: Gastroenterology

## 2012-08-04 NOTE — Telephone Encounter (Signed)
I had patient an appointment for 12/16, however, her daughter can only take her on the dates requested below, so patient is now scheduled for 08/12/12 at 1pm with Dr. Annalee Genta.

## 2012-08-05 NOTE — Telephone Encounter (Signed)
Left message on machine to call back  

## 2012-08-05 NOTE — Telephone Encounter (Signed)
Pt has been scheduled with Dr Christella Hartigan for 08/19/12 daughter is aware

## 2012-08-12 DIAGNOSIS — K219 Gastro-esophageal reflux disease without esophagitis: Secondary | ICD-10-CM | POA: Diagnosis not present

## 2012-08-12 DIAGNOSIS — H905 Unspecified sensorineural hearing loss: Secondary | ICD-10-CM | POA: Diagnosis not present

## 2012-08-12 DIAGNOSIS — R490 Dysphonia: Secondary | ICD-10-CM | POA: Diagnosis not present

## 2012-08-16 DIAGNOSIS — H04129 Dry eye syndrome of unspecified lacrimal gland: Secondary | ICD-10-CM | POA: Diagnosis not present

## 2012-08-16 DIAGNOSIS — H40019 Open angle with borderline findings, low risk, unspecified eye: Secondary | ICD-10-CM | POA: Diagnosis not present

## 2012-08-16 DIAGNOSIS — Z961 Presence of intraocular lens: Secondary | ICD-10-CM | POA: Diagnosis not present

## 2012-08-19 ENCOUNTER — Ambulatory Visit (INDEPENDENT_AMBULATORY_CARE_PROVIDER_SITE_OTHER): Payer: MEDICARE | Admitting: Gastroenterology

## 2012-08-19 ENCOUNTER — Encounter: Payer: Self-pay | Admitting: Gastroenterology

## 2012-08-19 VITALS — BP 110/60 | HR 72 | Ht 63.5 in | Wt 99.0 lb

## 2012-08-19 DIAGNOSIS — R630 Anorexia: Secondary | ICD-10-CM | POA: Diagnosis not present

## 2012-08-19 DIAGNOSIS — R1013 Epigastric pain: Secondary | ICD-10-CM | POA: Diagnosis not present

## 2012-08-19 NOTE — Patient Instructions (Addendum)
You will be set up for an upper endoscopy (for nausea, dyspepsia, anorexia, dysphagia). Take zantac/pepcid (generic equivalent) one pill every morning at breakfast time and one pill at bedtime every night.

## 2012-08-19 NOTE — Progress Notes (Signed)
Review of pertinent gastrointestinal problems: 1. Adenomatous polyp:  Colonoscopy January 2012 (age [age]) done for fecal occult positive stool, 14 mm adenoma was removed in piecemeal fashion. I felt was completely resected and did not require any further surveillance given her age.   HPI: This is a     very pleasant [name] whom I last saw about 2 years ago. She is here for a new, unrelated problem.  She has had epigastric distress for a long time.  Had cardiac workup with PCP and "that checked out pretty well."   Has been on phenergan for nausea for a long time.  CHanged to zofran which caused a signficant allergic reaction.    Has been hoarse.  Saw ENT, they felt the cords were inflammed and that GERD was the cause.    Nausea, dyspepsia. No vomiting.  Has been going on for 1-2 years.  Avoids difficult to chew foods due to dentures not fitting well.    Night sweats +++.  Overall weight is up recently.    + solid food dysphagia at times.  Has been on reglan one pill a day for 10 years.  She has never stopped taking.  She is allergic to prilosec (had a rash, welts)  She has tried OTC antiacid meds. Zantac once in a while.   Labs 05/2012: cbc, lfts all normal. Creatinine slightly elevated.    Review of systems: Pertinent positive and negative review of systems were noted in the above HPI section. Complete review of systems was performed and was otherwise normal.    Past Medical History  Diagnosis Date  . Anxiety   . Depression   . Hyperlipidemia   . Thyroid disease     hypothyroidism  . Anemia   . Cataract 05/2011    Past Surgical History  Procedure Date  . Coronary artery bypass graft   . Obstructed ureter     operative repair  . Abdominal hysterectomy   . Total abdominal hysterectomy w/ bilateral salpingoophorectomy   . Kyphosis surgery   . Basal cell carcinoma excision   . Eye surgery 05/2011    Current Outpatient Prescriptions  Medication Sig  Dispense Refill  . ALPRAZolam (XANAX) 0.5 MG tablet 1 po bid prn  180 tablet  0  . aspirin 81 MG EC tablet Take 81 mg by mouth daily.        . Cholecalciferol (VITAMIN D3) 1000 UNITS CAPS Take by mouth daily.        . ferrous fumarate (FERRETTS) 325 (106 FE) MG TABS Take by mouth.        . folic acid (FOLVITE) 1 MG tablet Take 1 mg by mouth daily.        . furosemide (LASIX) 40 MG tablet Take 1 tablet (40 mg total) by mouth daily as needed.  90 tablet  3  . levothyroxine (SYNTHROID) 25 MCG tablet Take 1 tablet (25 mcg total) by mouth daily.  90 tablet  3  . metoCLOPramide (REGLAN) 10 MG tablet Take 1 tablet (10 mg total) by mouth 3 (three) times daily.  270 tablet  3  . Multiple Vitamin (MULTIVITAMIN) tablet Take 1 tablet by mouth daily.        . potassium chloride SA (K-DUR,KLOR-CON) 20 MEQ tablet Take 1 tablet (20 mEq total) by mouth daily.  90 tablet  3  . predniSONE (DELTASONE) 10 MG tablet 3 po qd for 3 days then 2 po qd for 3 days the 1 po qd for  3 days  18 tablet  0  . rOPINIRole (REQUIP) 1 MG tablet Take 1 tablet (1 mg total) by mouth at bedtime.  90 tablet  3  . simvastatin (ZOCOR) 80 MG tablet Take 1 tablet (80 mg total) by mouth at bedtime.  90 tablet  3  . temazepam (RESTORIL) 30 MG capsule Take 1 capsule (30 mg total) by mouth at bedtime as needed.  90 capsule  0  . tiZANidine (ZANAFLEX) 2 MG tablet Take 1 tablet (2 mg total) by mouth 3 (three) times daily.  270 tablet  0  . traMADol (ULTRAM) 50 MG tablet Take 1 tablet (50 mg total) by mouth every 6 (six) hours as needed.  270 tablet  3    Allergies as of 08/19/2012 - Review Complete 08/19/2012  Allergen Reaction Noted  . Zofran (ondansetron hcl) Anaphylaxis and Other (See Comments) 07/23/2012  . Clarithromycin    . Codeine    . Diazepam    . Sulfonamide derivatives    . Prilosec (omeprazole magnesium) Rash 05/26/2011    Family History  Problem Relation Age of Onset  . Parkinsonism Father   . Cancer Other     breast 1st  degree relative  . Hypertension Other   . Heart disease Other     CAD.Marland Kitchen1ST DEGREE RELATIVE <60  . Hyperlipidemia Other     History   Social History  . Marital Status: Single    Spouse Name: N/A    Number of Children: 2  . Years of Education: N/A   Occupational History  . Retired    Social History Main Topics  . Smoking status: Former Games developer  . Smokeless tobacco: Never Used  . Alcohol Use: No  . Drug Use: No  . Sexually Active: Not Currently   Other Topics Concern  . Not on file   Social History Narrative   REG EXERCISEWIDOWLIVES ALONEEND OF LIFE:PATIENT HAS LIVING WILL AND CLEARLY STATES SHE DOES NOT WANT CARDIAC RESUSCITATION,MECHANICLA VENTILATION OR OTHER HEROIC OR FUTILE MEASURES.       Physical Exam: BP 110/60  Pulse 72  Ht 5' 3.5" (1.613 m)  Wt 99 lb (44.906 kg)  BMI 17.26 kg/m2 Constitutional: generally well-appearing Psychiatric: alert and oriented x3 Eyes: extraocular movements intact Mouth: oral pharynx moist, no lesions Neck: supple no lymphadenopathy Cardiovascular: heart regular rate and rhythm Lungs: clear to auscultation bilaterally Abdomen: soft, nontender, nondistended, no obvious ascites, no peritoneal signs, normal bowel sounds Extremities: no lower extremity edema bilaterally Skin: no lesions on visible extremities    Assessment and plan: 79 y.o. female with  anorexia, dyspepsia, dysphasia, GERD  She has had what sounds like a pretty clear allergic reaction to Prilosec. I am a bit hesitant to try other proton pump inhibitors and instead will have her take twice daily H2 blocker for now. I will proceed with EGD at her soonest convenience.

## 2012-08-20 ENCOUNTER — Encounter: Payer: Self-pay | Admitting: Gastroenterology

## 2012-08-20 ENCOUNTER — Ambulatory Visit (AMBULATORY_SURGERY_CENTER): Payer: MEDICARE | Admitting: Gastroenterology

## 2012-08-20 VITALS — BP 104/60 | HR 68 | Temp 97.8°F | Resp 16 | Ht 63.5 in | Wt 99.0 lb

## 2012-08-20 DIAGNOSIS — K2990 Gastroduodenitis, unspecified, without bleeding: Secondary | ICD-10-CM

## 2012-08-20 DIAGNOSIS — R630 Anorexia: Secondary | ICD-10-CM

## 2012-08-20 DIAGNOSIS — R1013 Epigastric pain: Secondary | ICD-10-CM

## 2012-08-20 DIAGNOSIS — K2970 Gastritis, unspecified, without bleeding: Secondary | ICD-10-CM

## 2012-08-20 DIAGNOSIS — B3781 Candidal esophagitis: Secondary | ICD-10-CM | POA: Diagnosis not present

## 2012-08-20 MED ORDER — FLUCONAZOLE 100 MG PO TABS: 100.0000 mg | ORAL_TABLET | Freq: Every day | ORAL | Status: AC

## 2012-08-20 MED ORDER — SODIUM CHLORIDE 0.9 % IV SOLN: 500.0000 mL | INTRAVENOUS | Status: DC

## 2012-08-20 NOTE — Patient Instructions (Addendum)
Impressions/Recommendations:  Esophageal yeast infection. Gastritis  Start diflucan. Await further biopsy results.  YOU HAD AN ENDOSCOPIC PROCEDURE TODAY AT THE [facility]: Refer to the procedure report that was given to you for any specific questions about what was found during the examination.  If the procedure report does not answer your questions, please call your gastroenterologist to clarify.  If you requested that your care partner not be given the details of your procedure findings, then the procedure report has been included in a sealed envelope for you to review at your convenience later.  YOU SHOULD EXPECT: Some feelings of bloating in the abdomen. Passage of more gas than usual.  Walking can help get rid of the air that was put into your GI tract during the procedure and reduce the bloating. If you had a lower endoscopy (such as a colonoscopy or flexible sigmoidoscopy) you may notice spotting of blood in your stool or on the toilet paper. If you underwent a bowel prep for your procedure, then you may not have a normal bowel movement for a few days.  DIET: Your first meal following the procedure should be a light meal and then it is ok to progress to your normal diet.  A half-sandwich or bowl of soup is an example of a good first meal.  Heavy or fried foods are harder to digest and may make you feel nauseous or bloated.  Likewise meals heavy in dairy and vegetables can cause extra gas to form and this can also increase the bloating.  Drink plenty of fluids but you should avoid alcoholic beverages for 24 hours.  ACTIVITY: Your care partner should take you home directly after the procedure.  You should plan to take it easy, moving slowly for the rest of the day.  You can resume normal activity the day after the procedure however you should NOT DRIVE or use heavy machinery for 24 hours (because of the sedation medicines used during the test).    SYMPTOMS TO REPORT  IMMEDIATELY: A gastroenterologist can be reached at any hour.  During normal business hours, 8:30 AM to 5:00 PM Monday through Friday, call [phone].  After hours and on weekends, please call the GI answering service at [phone] who will take a message and have the physician on call contact you.    Following upper endoscopy (EGD)  Vomiting of blood or coffee ground material  New chest pain or pain under the shoulder blades  Painful or persistently difficult swallowing  New shortness of breath  Fever of 100F or higher  Black, tarry-looking stools  FOLLOW UP: If any biopsies were taken you will be contacted by phone or by letter within the next 1-3 weeks.  Call your gastroenterologist if you have not heard about the biopsies in 3 weeks.  Our staff will call the home number listed on your records the next business day following your procedure to check on you and address any questions or concerns that you may have at that time regarding the information given to you following your procedure. This is a courtesy call and so if there is no answer at the home number and we have not heard from you through the emergency physician on call, we will assume that you have returned to your regular daily activities without incident.  SIGNATURES/CONFIDENTIALITY: You and/or your care partner have signed paperwork which will be entered into your electronic medical record.  These signatures attest to the fact that that the information  above on your After Visit Summary has been reviewed and is understood.  Full responsibility of the confidentiality of this discharge information lies with you and/or your care-partner.  

## 2012-08-20 NOTE — Progress Notes (Signed)
Patient did not experience any of the following events: a burn prior to discharge; a fall within the facility; wrong site/side/patient/procedure/implant event; or a hospital transfer or hospital admission upon discharge from the facility. (G8907) Patient did not have preoperative order for IV antibiotic SSI prophylaxis. (G8918)  

## 2012-08-20 NOTE — Op Note (Signed)
[facility] [address] [place], [address]   ENDOSCOPY PROCEDURE REPORT  PATIENT: [name], [name]  MR#: [id number] BIRTHDATE: May 19, 1928 , 84  yrs. old GENDER: Female ENDOSCOPIST: Rachael Fee, MD PROCEDURE DATE:  08/20/2012 PROCEDURE:  EGD w/ biopsy ASA CLASS:     Class II INDICATIONS:  nausea, anorexia, intermittent dysphagia. MEDICATIONS: Fentanyl 50 mcg IV, Versed 4 mg IV, and These medications were titrated to patient response per physician's verbal order TOPICAL ANESTHETIC: Cetacaine Spray  DESCRIPTION OF PROCEDURE: After the risks benefits and alternatives of the procedure were thoroughly explained, informed consent was obtained.  The FUSE Demo Scope endoscope was introduced through the mouth and advanced to the second portion of the duodenum. Without limitations.  The instrument was slowly withdrawn as the mucosa was fully examined.     There were several yellow patches in esophagus, consistent with fungal infection.  There was mild to moderate pan-gastritis. Biopsies taken and sent to pathology (jar 1).  The examination was otherwise normal.   Images taken but only available in hard copy form that will be scanned into EPIC [employer]).  Retroflexed views revealed no abnormalities.     The scope was then withdrawn from the patient and the procedure completed.  COMPLICATIONS: There were no complications.  ENDOSCOPIC IMPRESSION: Esophageal yeast infection Mild to moderate pan-gastritis, biopsied to check for H. pylori The examination was otherwise normal.  RECOMMENDATIONS: You should start diflucan (once daily antibiotic to treat your esophageal yeast infection, called into your pharmacy).  Await final biopsy report for further recommendations.    eSigned:  Rachael Fee, MD 08/20/2012 12:06 PM   CC: Loreen Freud, MD

## 2012-08-23 ENCOUNTER — Encounter: Payer: Self-pay | Admitting: Gastroenterology

## 2012-08-23 ENCOUNTER — Telehealth: Payer: Self-pay | Admitting: *Deleted

## 2012-08-23 NOTE — Telephone Encounter (Signed)
  Follow up Call-  Call back number 08/20/2012  Post procedure Call Back phone  # [phone] hm  Permission to leave phone message Yes     Patient questions:  Do you have a fever, pain , or abdominal swelling? no Pain Score  0 *  Have you tolerated food without any problems? yes  Have you been able to return to your normal activities? yes  Do you have any questions about your discharge instructions: Diet   no Medications  no Follow up visit  no  Do you have questions or concerns about your Care? no  Actions: * If pain score is 4 or above: No action needed, pain <4.

## 2012-08-28 ENCOUNTER — Encounter: Payer: Self-pay | Admitting: Gastroenterology

## 2012-09-01 ENCOUNTER — Telehealth: Payer: Self-pay | Admitting: Gastroenterology

## 2012-09-01 NOTE — Telephone Encounter (Signed)
Results letter has been mailed I called the pt to inform her but no one answered so I let a message

## 2012-09-01 NOTE — Telephone Encounter (Signed)
Pt has completed the yeast medication and has started on Zantac but still has some abd pain, nausea and belching she will continue for another week and call back if feeling no better.

## 2012-09-01 NOTE — Telephone Encounter (Signed)
Ok, thanks.

## 2012-09-20 ENCOUNTER — Telehealth: Payer: Self-pay | Admitting: Family Medicine

## 2012-09-20 DIAGNOSIS — F419 Anxiety disorder, unspecified: Secondary | ICD-10-CM

## 2012-09-20 MED ORDER — ALPRAZOLAM 0.5 MG PO TABS: ORAL_TABLET | ORAL | Status: DC

## 2012-09-20 NOTE — Telephone Encounter (Signed)
X 1 only

## 2012-09-20 NOTE — Telephone Encounter (Signed)
Last seen 07/23/12 and 05/31/12 #180. Please advise     KP

## 2012-09-20 NOTE — Telephone Encounter (Signed)
Patient called requesting Alprazolam #180 be sent to [facility].

## 2012-10-07 ENCOUNTER — Encounter: Payer: Self-pay | Admitting: Family Medicine

## 2012-10-11 DIAGNOSIS — R490 Dysphonia: Secondary | ICD-10-CM | POA: Diagnosis not present

## 2012-10-14 ENCOUNTER — Encounter: Payer: Self-pay | Admitting: Lab

## 2012-10-15 ENCOUNTER — Ambulatory Visit (INDEPENDENT_AMBULATORY_CARE_PROVIDER_SITE_OTHER): Payer: MEDICARE | Admitting: Family Medicine

## 2012-10-15 VITALS — BP 92/60 | HR 88 | Temp 97.4°F | Wt 94.4 lb

## 2012-10-15 DIAGNOSIS — R1013 Epigastric pain: Secondary | ICD-10-CM

## 2012-10-15 DIAGNOSIS — G8929 Other chronic pain: Secondary | ICD-10-CM

## 2012-10-15 LAB — BASIC METABOLIC PANEL
BUN: 14 mg/dL (ref 6–23)
CO2: 30 mEq/L (ref 19–32)
Chloride: 103 mEq/L (ref 96–112)
Glucose, Bld: 96 mg/dL (ref 70–99)
Potassium: 3.2 mEq/L — ABNORMAL LOW (ref 3.5–5.1)

## 2012-10-15 NOTE — Patient Instructions (Signed)
Diet for Gastroesophageal Reflux Disease, Adult  Reflux (acid reflux) is when acid from your stomach flows up into the esophagus. When acid comes in contact with the esophagus, the acid causes irritation and soreness (inflammation) in the esophagus. When reflux happens often or so severely that it causes damage to the esophagus, it is called gastroesophageal reflux disease (GERD). Nutrition therapy can help ease the discomfort of GERD.  FOODS OR DRINKS TO AVOID OR LIMIT   Smoking or chewing tobacco. Nicotine is one of the most potent stimulants to acid production in the gastrointestinal tract.   Caffeinated and decaffeinated coffee and black tea.   Regular or low-calorie carbonated beverages or energy drinks (caffeine-free carbonated beverages are allowed).    Strong spices, such as black pepper, white pepper, red pepper, cayenne, curry powder, and chili powder.   Peppermint or spearmint.   Chocolate.   High-fat foods, including meats and fried foods. Extra added fats including oils, butter, salad dressings, and nuts. Limit these to less than 8 tsp per day.   Fruits and vegetables if they are not tolerated, such as citrus fruits or tomatoes.   Alcohol.   Any food that seems to aggravate your condition.  If you have questions regarding your diet, call your caregiver or a registered dietitian.  OTHER THINGS THAT MAY HELP GERD INCLUDE:    Eating your meals slowly, in a relaxed setting.   Eating 5 to 6 small meals per day instead of 3 large meals.   Eliminating food for a period of time if it causes distress.   Not lying down until 3 hours after eating a meal.   Keeping the head of your bed raised 6 to 9 inches (15 to 23 cm) by using a foam wedge or blocks under the legs of the bed. Lying flat may make symptoms worse.   Being physically active. Weight loss may be helpful in reducing reflux in overweight or obese adults.   Wear loose fitting clothing  EXAMPLE MEAL PLAN  This meal plan is approximately  2,000 calories based on ChooseMyPlate.gov meal planning guidelines.  Breakfast    cup cooked oatmeal.   1 cup strawberries.   1 cup low-fat milk.   1 oz almonds.  Snack   1 cup cucumber slices.   6 oz yogurt (made from low-fat or fat-free milk).  Lunch   2 slice whole-wheat bread.   2 oz sliced turkey.   2 tsp mayonnaise.   1 cup blueberries.   1 cup snap peas.  Snack   6 whole-wheat crackers.   1 oz string cheese.  Dinner    cup brown rice.   1 cup mixed veggies.   1 tsp olive oil.   3 oz grilled fish.  Document Released: 08/11/2005 Document Revised: 11/03/2011 Document Reviewed: 06/27/2011  Exit[employer] Patient Information 2013 [employer].

## 2012-10-16 ENCOUNTER — Encounter: Payer: Self-pay | Admitting: Family Medicine

## 2012-10-16 NOTE — Progress Notes (Signed)
  Subjective:     [name] is a 79 y.o. female who presents for evaluation of abdominal pain. Onset was several months ago. Symptoms have been unchanged. The pain is described as sharp,. Pain is located in the epigastric region without radiation.  Aggravating factors: none.  Alleviating factors: none. Associated symptoms: constipation and diarrhea. The patient denies anorexia, arthralagias, belching, chills, dysuria, fever, frequency, headache, hematochezia, hematuria, melena, myalgias, sweats and vomiting.  The patient's history has been marked as reviewed and updated as appropriate.  Review of Systems Pertinent items are noted in HPI.     Objective:    BP 92/60  Pulse 88  Temp(Src) 97.4 F (36.3 C) (Tympanic)  Wt 94 lb 6.4 oz (42.82 kg)  BMI 16.46 kg/m2  SpO2 97% General appearance: alert, cooperative, appears stated age and no distress Abdomen: abnormal findings:  mild tenderness in the epigastrium Lymph nodes: Cervical, supraclavicular, and axillary nodes normal.    Assessment:    Abdominal pain .    Plan:    See orders for lab and imaging studies. Adhere to simple, bland diet. Further follow-up plans will be based on outcome of lab/imaging studies; see orders. con't current meds

## 2012-10-18 ENCOUNTER — Encounter: Payer: Self-pay | Admitting: Family Medicine

## 2012-10-19 ENCOUNTER — Telehealth: Payer: Self-pay | Admitting: *Deleted

## 2012-10-19 ENCOUNTER — Telehealth: Payer: Self-pay

## 2012-10-19 ENCOUNTER — Telehealth: Payer: Self-pay | Admitting: Gastroenterology

## 2012-10-19 ENCOUNTER — Ambulatory Visit
Admission: RE | Admit: 2012-10-19 | Discharge: 2012-10-19 | Disposition: A | Discharge status: Home / Self Care | Payer: MEDICARE | Source: Ambulatory Visit | Attending: Family Medicine | Admitting: Family Medicine

## 2012-10-19 ENCOUNTER — Other Ambulatory Visit: Payer: Self-pay | Admitting: Family Medicine

## 2012-10-19 DIAGNOSIS — Z79899 Other long term (current) drug therapy: Secondary | ICD-10-CM | POA: Diagnosis not present

## 2012-10-19 DIAGNOSIS — N2889 Other specified disorders of kidney and ureter: Secondary | ICD-10-CM

## 2012-10-19 DIAGNOSIS — N289 Disorder of kidney and ureter, unspecified: Secondary | ICD-10-CM | POA: Diagnosis not present

## 2012-10-19 DIAGNOSIS — G8929 Other chronic pain: Secondary | ICD-10-CM

## 2012-10-19 MED ORDER — IOHEXOL 300 MG/ML  SOLN
95.0000 mL | Freq: Once | INTRAMUSCULAR | Status: AC | PRN
  Administered 2012-10-19: 95 mL via INTRAVENOUS

## 2012-10-19 NOTE — Telephone Encounter (Signed)
I called and advised Dorene Grebe in the [facility] that the letter was still open in EPIC and didn't appear to be mailed.  She states she  will look into it. I will forward her a copy of this message.

## 2012-10-19 NOTE — Telephone Encounter (Signed)
Message copied by Arnette Norris on Tue Oct 19, 2012  2:02 PM ------      Message from: Lelon Perla      Created: Tue Oct 19, 2012 11:39 AM       Mass on kidney --needs asap urology appointment ------

## 2012-10-19 NOTE — Telephone Encounter (Signed)
Returned pt's call and read path letter to her over the phone.  I told her that she would be mailed the letter today and I apologized that she had not received it .  She states that she is doing fine and will call back if she has questions or concerns.

## 2012-10-19 NOTE — Telephone Encounter (Signed)
Discussed with daughter and she stated she was told that her mother has kidney cancer by the radiologist. She does not want her mother to know, she would like for a call back with the apt information on her cellphone and would like an apt asap.       KP

## 2012-11-02 ENCOUNTER — Other Ambulatory Visit (INDEPENDENT_AMBULATORY_CARE_PROVIDER_SITE_OTHER): Payer: MEDICARE

## 2012-11-02 DIAGNOSIS — E876 Hypokalemia: Secondary | ICD-10-CM

## 2012-11-03 ENCOUNTER — Encounter: Payer: Self-pay | Admitting: Family Medicine

## 2012-11-03 DIAGNOSIS — N289 Disorder of kidney and ureter, unspecified: Secondary | ICD-10-CM | POA: Diagnosis not present

## 2012-11-04 ENCOUNTER — Encounter: Payer: Self-pay | Admitting: Family Medicine

## 2012-11-04 DIAGNOSIS — E876 Hypokalemia: Secondary | ICD-10-CM

## 2012-11-05 ENCOUNTER — Other Ambulatory Visit: Payer: Self-pay | Admitting: Family Medicine

## 2012-11-05 DIAGNOSIS — E876 Hypokalemia: Secondary | ICD-10-CM

## 2012-11-10 ENCOUNTER — Encounter: Payer: Self-pay | Admitting: Family Medicine

## 2012-11-22 DIAGNOSIS — Z1231 Encounter for screening mammogram for malignant neoplasm of breast: Secondary | ICD-10-CM | POA: Diagnosis not present

## 2012-11-22 DIAGNOSIS — D1801 Hemangioma of skin and subcutaneous tissue: Secondary | ICD-10-CM | POA: Diagnosis not present

## 2012-11-22 DIAGNOSIS — L821 Other seborrheic keratosis: Secondary | ICD-10-CM | POA: Diagnosis not present

## 2012-11-22 DIAGNOSIS — L819 Disorder of pigmentation, unspecified: Secondary | ICD-10-CM | POA: Diagnosis not present

## 2012-11-22 DIAGNOSIS — Z85828 Personal history of other malignant neoplasm of skin: Secondary | ICD-10-CM | POA: Diagnosis not present

## 2012-11-22 DIAGNOSIS — D239 Other benign neoplasm of skin, unspecified: Secondary | ICD-10-CM | POA: Diagnosis not present

## 2012-11-23 ENCOUNTER — Ambulatory Visit (INDEPENDENT_AMBULATORY_CARE_PROVIDER_SITE_OTHER): Payer: MEDICARE | Admitting: Gastroenterology

## 2012-11-23 ENCOUNTER — Encounter: Payer: Self-pay | Admitting: Gastroenterology

## 2012-11-23 VITALS — BP 100/62 | HR 69 | Ht 63.5 in | Wt 95.4 lb

## 2012-11-23 DIAGNOSIS — R110 Nausea: Secondary | ICD-10-CM | POA: Diagnosis not present

## 2012-11-23 MED ORDER — FLUCONAZOLE 100 MG PO TABS: 100.0000 mg | ORAL_TABLET | Freq: Every day | ORAL | Status: DC

## 2012-11-23 MED ORDER — METOCLOPRAMIDE HCL 10 MG PO TABS: 10.0000 mg | ORAL_TABLET | Freq: Every day | ORAL | Status: DC

## 2012-11-23 NOTE — Progress Notes (Signed)
Review of pertinent gastrointestinal problems:  1. Adenomatous polyp: Colonoscopy January 2012 (age 79) done for fecal occult positive stool, 14 mm adenoma was removed in piecemeal fashion. I felt was completely resected and did not require any further surveillance given her age.  2. GERD:  Had allergic (welts, rash) reaction to prilosec ;  EGD 07/2012  Esophageal yeast infection Mild to moderate pan-gastritis, biopsied to check for H. pylori The examination was otherwise normal; biopsies showed chronic gastritis, no H. pylori   HPI: This is a   very pleasant 79 year old woman whom I last saw about 4 months ago at the time of upper endoscopy. She is here with her daughter today.  She completed diflucan course.  She cannot remember if this helped with any symptoms.  Whom I last saw 4 months.  Her biggest concern is reflux type symptoms, usually worse at night laying down.    She thinks she has diet related indigestion, [place] (did not vomit).  She has nausea just about every day.  She takes reglan every day for many years, not sure why she is on it or what it helps with.  Weight fluctuates 3-4 pounds each way.  IMPRESSION CT scan 2/2013done for epig pain: 17 mm irregular solid mass on the lateral aspect of the midportion of the right kidney, most likely a small renal cell carcinoma. 2. No other significant abnormalities.  She sees a Sales executive for this.      Past Medical History  Diagnosis Date  . Anxiety   . Depression   . Hyperlipidemia   . Anemia   . Cataract 05/2011  . Thyroid disease     hypothyroidism  . Allergy   . GERD (gastroesophageal reflux disease)   . Myocardial infarction   . Osteoporosis   . Renal cell carcinoma     found on CT    Past Surgical History  Procedure Laterality Date  . Coronary artery bypass graft    . Obstructed ureter      operative repair  . Abdominal hysterectomy    . Total abdominal hysterectomy w/ bilateral salpingoophorectomy     . Kyphosis surgery    . Basal cell carcinoma excision    . Eye surgery  05/2011  . Tonsillectomy    . Appendectomy    . Colonoscopy      Current Outpatient Prescriptions  Medication Sig Dispense Refill  . ALPRAZolam (XANAX) 0.5 MG tablet 1 po bid prn  180 tablet  0  . aspirin 81 MG EC tablet Take 81 mg by mouth daily.        . Cholecalciferol (VITAMIN D3) 1000 UNITS CAPS Take by mouth daily.        . ferrous fumarate (FERRETTS) 325 (106 FE) MG TABS Take by mouth.        . folic acid (FOLVITE) 1 MG tablet Take 1 mg by mouth daily.        . furosemide (LASIX) 40 MG tablet Take 1 tablet (40 mg total) by mouth daily as needed.  90 tablet  3  . levothyroxine (SYNTHROID) 25 MCG tablet Take 1 tablet (25 mcg total) by mouth daily.  90 tablet  3  . metoCLOPramide (REGLAN) 10 MG tablet Take 1 tablet (10 mg total) by mouth 3 (three) times daily.  270 tablet  3  . Multiple Vitamin (MULTIVITAMIN) tablet Take 1 tablet by mouth daily.        . potassium chloride SA (K-DUR,KLOR-CON) 20 MEQ tablet Take 1  tablet (20 mEq total) by mouth daily.  90 tablet  3  . ranitidine (ZANTAC) 150 MG tablet Take 150 mg by mouth 2 (two) times daily.      Marland Kitchen rOPINIRole (REQUIP) 1 MG tablet Take 1 tablet (1 mg total) by mouth at bedtime.  90 tablet  3  . simvastatin (ZOCOR) 80 MG tablet Take 1 tablet (80 mg total) by mouth at bedtime.  90 tablet  3  . tiZANidine (ZANAFLEX) 2 MG tablet Take 1 tablet (2 mg total) by mouth 3 (three) times daily.  270 tablet  0  . traMADol (ULTRAM) 50 MG tablet Take 1 tablet (50 mg total) by mouth every 6 (six) hours as needed.  270 tablet  3   No current facility-administered medications for this visit.    Allergies as of 11/23/2012 - Review Complete 11/23/2012  Allergen Reaction Noted  . Zofran (ondansetron hcl) Anaphylaxis and Other (See Comments) 07/23/2012  . Clarithromycin    . Codeine    . Diazepam    . Sulfonamide derivatives    . Prilosec (omeprazole magnesium) Rash  05/26/2011    Family History  Problem Relation Age of Onset  . Parkinsonism Father   . Cancer Other     breast 1st degree relative  . Hypertension Other   . Heart disease Other     CAD.Marland Kitchen1ST DEGREE RELATIVE <60  . Hyperlipidemia Other   . Colon cancer Neg Hx   . Esophageal cancer Neg Hx   . Rectal cancer Neg Hx   . Stomach cancer Neg Hx     History   Social History  . Marital Status: Single    Spouse Name: N/A    Number of Children: 2  . Years of Education: N/A   Occupational History  . Retired    Social History Main Topics  . Smoking status: Former Games developer  . Smokeless tobacco: Never Used  . Alcohol Use: No  . Drug Use: No  . Sexually Active: Not Currently   Other Topics Concern  . Not on file   Social History Narrative   REG EXERCISE   WIDOW   LIVES ALONE   END OF LIFE:PATIENT HAS LIVING WILL AND CLEARLY STATES SHE DOES NOT WANT CARDIAC RESUSCITATION,MECHANICLA VENTILATION OR OTHER HEROIC OR FUTILE MEASURES.      Physical Exam: BP 100/62  Pulse 69  Ht 5' 3.5" (1.613 m)  Wt 95 lb 6.4 oz (43.273 kg)  BMI 16.63 kg/m2  SpO2 98% Constitutional: generally well-appearing Psychiatric: alert and oriented x3 Abdomen: soft, nontender, nondistended, no obvious ascites, no peritoneal signs, normal bowel sounds     Assessment and plan: 79 y.o. female with chronic GI complaints including nausea, dyspepsia  She has been taking Reglan 3 times daily for many years and is not really sure why she is on or what symptom is helping. Have her stop 2 over daily doses and only continue the nightly dose prior to bedtime. She may have felt slightly somewhat better after Diflucan course for endoscopically diagnosed esophageal yeast infection 3 months ago. Her symptoms recollection on this is pretty vague but her daughter thinks she was complaining less during the course of antibiotics. Going to retry her on another course of antibiotics. She will continue taking her Zantac twice  daily. She will return to see me in 6-7 weeks and sooner if needed.

## 2012-11-23 NOTE — Patient Instructions (Addendum)
Please cut back on the reglan so that you are only taking it once per day (at bedtime). Low acid diet (handout given). Continue zantac twice daily (generic is good). Please return to see Dr. Christella Hartigan in 6-8 weeks. Retry diflucan course, new script called in today. Pay attention to swallowing issues and nausea.                                               We are excited to introduce MyChart, a new best-in-class service that provides you online access to important information in your electronic medical record. We want to make it easier for you to view your health information - all in one secure location - when and where you need it. We expect MyChart will enhance the quality of care and service we provide.  When you register for MyChart, you can:    View your test results.    Request appointments and receive appointment reminders via email.    Request medication renewals.    View your medical history, allergies, medications and immunizations.    Communicate with your physician's office through a password-protected site.    Conveniently print information such as your medication lists.  To find out if MyChart is right for you, please talk to a member of our clinical staff today. We will gladly answer your questions about this free health and wellness tool.  If you are age 14 or older and want a member of your family to have access to your record, you must provide written consent by completing a proxy form available at our office. Please speak to our clinical staff about guidelines regarding accounts for patients younger than age 46.  As you activate your MyChart account and need any technical assistance, please call the MyChart technical support line at [phone]) or email your question to [email or web address]. If you email your question(s), please include your name, a return phone number and the best time to reach you.  If you have non-urgent health-related  questions, you can send a message to our office through MyChart at [email or web address]. If you have a medical emergency, call 911.  Thank you for using MyChart as your new health and wellness resource!   MyChart licensed from [employer],  [id number]. Patents Pending.

## 2012-12-02 DIAGNOSIS — M8448XA Pathological fracture, other site, initial encounter for fracture: Secondary | ICD-10-CM | POA: Diagnosis not present

## 2012-12-03 ENCOUNTER — Ambulatory Visit: Payer: MEDICARE | Admitting: Family Medicine

## 2012-12-06 ENCOUNTER — Other Ambulatory Visit (INDEPENDENT_AMBULATORY_CARE_PROVIDER_SITE_OTHER): Payer: MEDICARE

## 2012-12-06 DIAGNOSIS — E876 Hypokalemia: Secondary | ICD-10-CM | POA: Diagnosis not present

## 2012-12-06 LAB — BASIC METABOLIC PANEL
BUN: 12 mg/dL (ref 6–23)
CO2: 32 mEq/L (ref 19–32)
Chloride: 98 mEq/L (ref 96–112)
GFR: 66.15 mL/min (ref >60.00)
Glucose, Bld: 99 mg/dL (ref 70–99)
Potassium: 4.4 mEq/L (ref 3.5–5.1)
Sodium: 134 mEq/L — ABNORMAL LOW (ref 135–145)

## 2012-12-31 ENCOUNTER — Encounter: Payer: Self-pay | Admitting: Gastroenterology

## 2012-12-31 ENCOUNTER — Ambulatory Visit (INDEPENDENT_AMBULATORY_CARE_PROVIDER_SITE_OTHER): Payer: MEDICARE | Admitting: Gastroenterology

## 2012-12-31 VITALS — BP 98/60 | HR 88 | Ht 63.5 in | Wt 92.2 lb

## 2012-12-31 DIAGNOSIS — K219 Gastro-esophageal reflux disease without esophagitis: Secondary | ICD-10-CM

## 2012-12-31 NOTE — Patient Instructions (Addendum)
Continue your current meds. Call or return to see Dr. Christella Hartigan. If you have return of swallowing difficulty, nausea please call as you may have recurrent esophagus yeast infection.  Ask to speak with Patty.

## 2012-12-31 NOTE — Progress Notes (Signed)
Review of pertinent gastrointestinal problems:  1. Adenomatous polyp: Colonoscopy January 2012 (age 79) done for fecal occult positive stool, 14 mm adenoma was removed in piecemeal fashion. I felt was completely resected and did not require any further surveillance given her age.  2. GERD: Had allergic (welts, rash) reaction to prilosec ; EGD 07/2012 Esophageal yeast infection Mild to moderate pan-gastritis, biopsied to check for H. pylori The examination was otherwise normal; biopsies showed chronic gastritis, no H. Pylori.  May, 2014 doing well on twice daily H2 blocker,  bedtime Reglan.   HPI: This is a very pleasant 79 year old woman who is here with her daughter.  I last saw her about a month ago. Since then she is doing very well from a GI perspective  Taking zantac bid, taking reglan qhs.  Low acid type foods.  Has been doing well.  Potatoes, bananas, eggs, breads, not a bid meat eater. She is very happy with the way she is feeling now.  Can still have mild pyrosis.  Past Medical History  Diagnosis Date  . Anxiety   . Depression   . Hyperlipidemia   . Anemia   . Cataract 05/2011  . Thyroid disease     hypothyroidism  . Allergy   . GERD (gastroesophageal reflux disease)   . Myocardial infarction   . Osteoporosis   . Renal cell carcinoma     found on CT  . Esophageal yeast infection     Past Surgical History  Procedure Laterality Date  . Coronary artery bypass graft    . Obstructed ureter      operative repair  . Abdominal hysterectomy    . Total abdominal hysterectomy w/ bilateral salpingoophorectomy    . Kyphosis surgery    . Basal cell carcinoma excision    . Eye surgery  05/2011  . Tonsillectomy    . Appendectomy    . Colonoscopy      Current Outpatient Prescriptions  Medication Sig Dispense Refill  . ALPRAZolam (XANAX) 0.5 MG tablet 1 po bid prn  180 tablet  0  . amoxicillin (AMOXIL) 500 MG capsule Take 500 mg by mouth 4 (four) times daily.      Marland Kitchen aspirin 81  MG EC tablet Take 81 mg by mouth daily.        . Cholecalciferol (VITAMIN D3) 1000 UNITS CAPS Take by mouth daily.        . ferrous fumarate (FERRETTS) 325 (106 FE) MG TABS Take by mouth.        . folic acid (FOLVITE) 1 MG tablet Take 1 mg by mouth daily.        . furosemide (LASIX) 40 MG tablet Take 1 tablet (40 mg total) by mouth daily as needed.  90 tablet  3  . levothyroxine (SYNTHROID) 25 MCG tablet Take 1 tablet (25 mcg total) by mouth daily.  90 tablet  3  . metoCLOPramide (REGLAN) 10 MG tablet Take 1 tablet (10 mg total) by mouth at bedtime.  270 tablet  3  . Multiple Vitamin (MULTIVITAMIN) tablet Take 1 tablet by mouth daily.        . potassium chloride SA (K-DUR,KLOR-CON) 20 MEQ tablet Take 1 tablet (20 mEq total) by mouth daily.  90 tablet  3  . ranitidine (ZANTAC) 150 MG tablet Take 150 mg by mouth 2 (two) times daily.      Marland Kitchen rOPINIRole (REQUIP) 1 MG tablet Take 1 tablet (1 mg total) by mouth at bedtime.  90 tablet  3  . simvastatin (ZOCOR) 80 MG tablet Take 1 tablet (80 mg total) by mouth at bedtime.  90 tablet  3  . tiZANidine (ZANAFLEX) 2 MG tablet Take 1 tablet (2 mg total) by mouth 3 (three) times daily.  270 tablet  0  . traMADol (ULTRAM) 50 MG tablet Take 1 tablet (50 mg total) by mouth every 6 (six) hours as needed.  270 tablet  3   No current facility-administered medications for this visit.    Allergies as of 12/31/2012 - Review Complete 12/31/2012  Allergen Reaction Noted  . Zofran (ondansetron hcl) Anaphylaxis and Other (See Comments) 07/23/2012  . Clarithromycin    . Codeine    . Diazepam    . Sulfonamide derivatives    . Prilosec (omeprazole magnesium) Rash 05/26/2011    Family History  Problem Relation Age of Onset  . Parkinsonism Father   . Cancer Other     breast 1st degree relative  . Hypertension Other   . Heart disease Other     CAD.Marland Kitchen1ST DEGREE RELATIVE <60  . Hyperlipidemia Other   . Colon cancer Neg Hx   . Esophageal cancer Neg Hx   . Rectal  cancer Neg Hx   . Stomach cancer Neg Hx     History   Social History  . Marital Status: Single    Spouse Name: N/A    Number of Children: 2  . Years of Education: N/A   Occupational History  . Retired    Social History Main Topics  . Smoking status: Former Games developer  . Smokeless tobacco: Never Used  . Alcohol Use: No  . Drug Use: No  . Sexually Active: Not Currently   Other Topics Concern  . Not on file   Social History Narrative   REG EXERCISE   [employer]   LIVES ALONE   END OF LIFE:PATIENT HAS LIVING WILL AND CLEARLY STATES SHE DOES NOT WANT CARDIAC RESUSCITATION,MECHANICLA VENTILATION OR OTHER HEROIC OR FUTILE MEASURES.      Physical Exam: BP 98/60  Pulse 88  Ht 5' 3.5" (1.613 m)  Wt 92 lb 4 oz (41.844 kg)  BMI 16.08 kg/m2 Constitutional: generally well-appearing Psychiatric: alert and oriented x3 Abdomen: soft, nontender, nondistended, no obvious ascites, no peritoneal signs, normal bowel sounds     Assessment and plan: 79 y.o. female with GERD, GERD related dyspepsia  She will continue on twice daily H2 blocker as well as nightly Reglan. She knows to call here if she has any further questions or concerns.

## 2013-01-03 ENCOUNTER — Telehealth: Payer: Self-pay | Admitting: Gastroenterology

## 2013-01-03 MED ORDER — FLUCONAZOLE 100 MG PO TABS: 100.0000 mg | ORAL_TABLET | Freq: Every day | ORAL | Status: AC

## 2013-01-03 NOTE — Telephone Encounter (Signed)
rx sent and message left for caregiver on her voice mail to pick up rx

## 2013-01-03 NOTE — Telephone Encounter (Signed)
Can we send prescription for Diflucan?

## 2013-01-03 NOTE — Telephone Encounter (Signed)
Yes, restart diflucan 100mg  pills, take one pill daily for 10 days, disp 10 pills, 2 refills.  She should call us in 2-3 weeks to report on her symptoms, response to med

## 2013-01-04 DIAGNOSIS — M8448XA Pathological fracture, other site, initial encounter for fracture: Secondary | ICD-10-CM | POA: Diagnosis not present

## 2013-01-11 ENCOUNTER — Encounter: Payer: Self-pay | Admitting: Family Medicine

## 2013-01-18 DIAGNOSIS — R490 Dysphonia: Secondary | ICD-10-CM | POA: Diagnosis not present

## 2013-01-20 ENCOUNTER — Telehealth: Payer: Self-pay | Admitting: Gastroenterology

## 2013-01-20 DIAGNOSIS — M8448XA Pathological fracture, other site, initial encounter for fracture: Secondary | ICD-10-CM | POA: Diagnosis not present

## 2013-01-20 NOTE — Telephone Encounter (Signed)
Pt was notified that the diflucan has 2 refills on it, she thanked me for calling and will call with any further concerns

## 2013-01-26 DIAGNOSIS — M8448XA Pathological fracture, other site, initial encounter for fracture: Secondary | ICD-10-CM | POA: Diagnosis not present

## 2013-01-31 DIAGNOSIS — N289 Disorder of kidney and ureter, unspecified: Secondary | ICD-10-CM | POA: Diagnosis not present

## 2013-01-31 DIAGNOSIS — N2889 Other specified disorders of kidney and ureter: Secondary | ICD-10-CM | POA: Diagnosis not present

## 2013-02-02 DIAGNOSIS — M8448XA Pathological fracture, other site, initial encounter for fracture: Secondary | ICD-10-CM | POA: Diagnosis not present

## 2013-02-04 DIAGNOSIS — M8448XA Pathological fracture, other site, initial encounter for fracture: Secondary | ICD-10-CM | POA: Diagnosis not present

## 2013-02-09 DIAGNOSIS — N289 Disorder of kidney and ureter, unspecified: Secondary | ICD-10-CM | POA: Diagnosis not present

## 2013-02-11 DIAGNOSIS — M8448XA Pathological fracture, other site, initial encounter for fracture: Secondary | ICD-10-CM | POA: Diagnosis not present

## 2013-02-16 DIAGNOSIS — M8448XA Pathological fracture, other site, initial encounter for fracture: Secondary | ICD-10-CM | POA: Diagnosis not present

## 2013-02-18 DIAGNOSIS — M8448XA Pathological fracture, other site, initial encounter for fracture: Secondary | ICD-10-CM | POA: Diagnosis not present

## 2013-02-22 ENCOUNTER — Other Ambulatory Visit: Payer: Self-pay | Admitting: Gastroenterology

## 2013-02-23 ENCOUNTER — Encounter: Payer: Self-pay | Admitting: Gastroenterology

## 2013-02-24 ENCOUNTER — Telehealth: Payer: Self-pay | Admitting: Gastroenterology

## 2013-02-24 NOTE — Telephone Encounter (Signed)
Pt daughter was informed that the rx was sent on 02/22/13 she will check with the pharmacy and call back if there is a problem

## 2013-03-10 DIAGNOSIS — Z961 Presence of intraocular lens: Secondary | ICD-10-CM | POA: Diagnosis not present

## 2013-03-10 DIAGNOSIS — H1045 Other chronic allergic conjunctivitis: Secondary | ICD-10-CM | POA: Diagnosis not present

## 2013-03-10 DIAGNOSIS — H04129 Dry eye syndrome of unspecified lacrimal gland: Secondary | ICD-10-CM | POA: Diagnosis not present

## 2013-03-10 DIAGNOSIS — H04209 Unspecified epiphora, unspecified lacrimal gland: Secondary | ICD-10-CM | POA: Diagnosis not present

## 2013-03-10 DIAGNOSIS — H40019 Open angle with borderline findings, low risk, unspecified eye: Secondary | ICD-10-CM | POA: Diagnosis not present

## 2013-06-02 ENCOUNTER — Telehealth: Payer: Self-pay

## 2013-06-02 NOTE — Telephone Encounter (Signed)
LM for CB  HM reviewed. Due as noted: Flu Vaccine MMG 10/2012 CCS 08/2010

## 2013-06-03 NOTE — Telephone Encounter (Signed)
Medication and allergies: done  Pharmacy updated, uses [facility] for 90 day supply Pharmacy updated, uses [facility] for local pharmacy  HM UTD:   Immunizations due: Admin flu vaccine upon arrival  A/P:  Last:  PAP:       ?    MMG: UTD 10/2012  Dexa: UTD 10/2011  CCS: UTD 08/2010 DM: na HTN: due Lipids: due  To Discuss with Provider:  Nothing at this time

## 2013-06-06 ENCOUNTER — Encounter: Payer: Self-pay | Admitting: Family Medicine

## 2013-06-06 ENCOUNTER — Ambulatory Visit (INDEPENDENT_AMBULATORY_CARE_PROVIDER_SITE_OTHER): Payer: MEDICARE | Admitting: Family Medicine

## 2013-06-06 VITALS — BP 100/58 | HR 109 | Temp 98.1°F | Ht 63.5 in | Wt 87.6 lb

## 2013-06-06 DIAGNOSIS — M1990 Unspecified osteoarthritis, unspecified site: Secondary | ICD-10-CM

## 2013-06-06 DIAGNOSIS — R944 Abnormal results of kidney function studies: Secondary | ICD-10-CM

## 2013-06-06 DIAGNOSIS — E785 Hyperlipidemia, unspecified: Secondary | ICD-10-CM | POA: Diagnosis not present

## 2013-06-06 DIAGNOSIS — Z23 Encounter for immunization: Secondary | ICD-10-CM

## 2013-06-06 DIAGNOSIS — I252 Old myocardial infarction: Secondary | ICD-10-CM | POA: Diagnosis not present

## 2013-06-06 DIAGNOSIS — E039 Hypothyroidism, unspecified: Secondary | ICD-10-CM

## 2013-06-06 DIAGNOSIS — F419 Anxiety disorder, unspecified: Secondary | ICD-10-CM

## 2013-06-06 DIAGNOSIS — F411 Generalized anxiety disorder: Secondary | ICD-10-CM

## 2013-06-06 DIAGNOSIS — Z0000 Encounter for general adult medical examination without abnormal findings: Secondary | ICD-10-CM | POA: Diagnosis not present

## 2013-06-06 DIAGNOSIS — I10 Essential (primary) hypertension: Secondary | ICD-10-CM

## 2013-06-06 DIAGNOSIS — R110 Nausea: Secondary | ICD-10-CM

## 2013-06-06 DIAGNOSIS — D229 Melanocytic nevi, unspecified: Secondary | ICD-10-CM | POA: Insufficient documentation

## 2013-06-06 DIAGNOSIS — D492 Neoplasm of unspecified behavior of bone, soft tissue, and skin: Secondary | ICD-10-CM

## 2013-06-06 LAB — LIPID PANEL
Cholesterol: 130 mg/dL (ref 0–200)
HDL: 66.6 mg/dL (ref >39.00)
Triglycerides: 92 mg/dL (ref 0.0–149.0)

## 2013-06-06 LAB — CBC WITH DIFFERENTIAL/PLATELET
Basophils Absolute: 0 10*3/uL (ref 0.0–0.1)
Eosinophils Absolute: 0 10*3/uL (ref 0.0–0.7)
Lymphocytes Relative: 32.1 % (ref 12.0–46.0)
Lymphs Abs: 2.6 10*3/uL (ref 0.7–4.0)
MCHC: 33.8 g/dL (ref 30.0–36.0)
MCV: 93.7 fl (ref 78.0–100.0)
Monocytes Absolute: 0.6 10*3/uL (ref 0.1–1.0)
Neutrophils Relative %: 59.3 % (ref 43.0–77.0)
RBC: 4.6 Mil/uL (ref 3.87–5.11)
RDW: 13.6 % (ref 11.5–14.6)

## 2013-06-06 LAB — HEPATIC FUNCTION PANEL
ALT: 30 U/L (ref 0–35)
AST: 43 U/L — ABNORMAL HIGH (ref 0–37)
Albumin: 4.1 g/dL (ref 3.5–5.2)
Alkaline Phosphatase: 59 U/L (ref 39–117)
Bilirubin, Direct: 0.1 mg/dL (ref 0.0–0.3)
Total Protein: 7.1 g/dL (ref 6.0–8.3)

## 2013-06-06 LAB — BASIC METABOLIC PANEL
BUN: 13 mg/dL (ref 6–23)
CO2: 26 mEq/L (ref 19–32)
Calcium: 9.1 mg/dL (ref 8.4–10.5)
Chloride: 102 mEq/L (ref 96–112)
Creatinine, Ser: 1.4 mg/dL — ABNORMAL HIGH (ref 0.4–1.2)
GFR: 37.24 mL/min — ABNORMAL LOW (ref >60.00)
Glucose, Bld: 99 mg/dL (ref 70–99)

## 2013-06-06 LAB — TSH: TSH: 3.63 u[IU]/mL (ref 0.35–5.50)

## 2013-06-06 MED ORDER — POTASSIUM CHLORIDE CRYS ER 20 MEQ PO TBCR: 20.0000 meq | EXTENDED_RELEASE_TABLET | Freq: Every day | ORAL | Status: DC

## 2013-06-06 MED ORDER — TRAMADOL HCL 50 MG PO TABS: 50.0000 mg | ORAL_TABLET | Freq: Four times a day (QID) | ORAL | Status: DC | PRN

## 2013-06-06 MED ORDER — LEVOTHYROXINE SODIUM 25 MCG PO TABS: 25.0000 ug | ORAL_TABLET | Freq: Every day | ORAL | Status: DC

## 2013-06-06 MED ORDER — SIMVASTATIN 80 MG PO TABS: 80.0000 mg | ORAL_TABLET | Freq: Every day | ORAL | Status: DC

## 2013-06-06 MED ORDER — ALPRAZOLAM 0.5 MG PO TABS: ORAL_TABLET | ORAL | Status: DC

## 2013-06-06 MED ORDER — FUROSEMIDE 40 MG PO TABS: 40.0000 mg | ORAL_TABLET | Freq: Every day | ORAL | Status: DC | PRN

## 2013-06-06 MED ORDER — PROMETHAZINE HCL 25 MG PO TABS: 25.0000 mg | ORAL_TABLET | Freq: Two times a day (BID) | ORAL | Status: DC

## 2013-06-06 NOTE — Assessment & Plan Note (Signed)
Per nephrology 

## 2013-06-06 NOTE — Assessment & Plan Note (Signed)
Per cardiology 

## 2013-06-06 NOTE — Progress Notes (Signed)
Subjective:    [name] is a 79 y.o. female who presents for Medicare Annual/Subsequent preventive examination.  Preventive Screening-Counseling & Management  Tobacco History  Smoking status  . Former Smoker  Smokeless tobacco  . Never Used     Problems Prior to Visit 1. Nothing new  Current Problems (verified) Patient Active Problem List   Diagnosis Date Noted  . Allergic drug reaction 07/23/2012  . RLS (restless legs syndrome) 05/31/2012  . Insomnia 05/26/2011  . Osteopenia 05/26/2011  . HYPOTHYROIDISM 05/10/2008  . HYPERCALCEMIA 03/16/2008  . NONSPECIFIC ABNORM RESULTS KIDNEY FUNCTION STUDY 03/16/2008  . UTI 03/06/2008  . WEAKNESS 03/06/2008  . HYPERLIPIDEMIA 04/12/2007  . ARTIFICIAL MENOPAUSE 04/12/2007  . ANXIETY 01/12/2007  . DEPRESSION 01/12/2007  . MYOCARDIAL INFARCTION, HX OF 01/12/2007    Medications Prior to Visit Current Outpatient Prescriptions on File Prior to Visit  Medication Sig Dispense Refill  . ALPRAZolam (XANAX) 0.5 MG tablet 1 po bid prn  180 tablet  0  . aspirin 81 MG EC tablet Take 81 mg by mouth daily.        . Cholecalciferol (VITAMIN D3) 1000 UNITS CAPS Take by mouth daily.        . ferrous fumarate (FERRETTS) 325 (106 FE) MG TABS Take by mouth.        . fluconazole (DIFLUCAN) 100 MG tablet TAKE 1 TABLET BY MOUTH DAILY.  10 tablet  2  . folic acid (FOLVITE) 1 MG tablet Take 1 mg by mouth daily.        . furosemide (LASIX) 40 MG tablet Take 1 tablet (40 mg total) by mouth daily as needed.  90 tablet  3  . levothyroxine (SYNTHROID) 25 MCG tablet Take 1 tablet (25 mcg total) by mouth daily.  90 tablet  3  . metoCLOPramide (REGLAN) 10 MG tablet Take 1 tablet (10 mg total) by mouth at bedtime.  270 tablet  3  . Multiple Vitamin (MULTIVITAMIN) tablet Take 1 tablet by mouth daily.        . potassium chloride SA (K-DUR,KLOR-CON) 20 MEQ tablet Take 1 tablet (20 mEq total) by mouth daily.  90 tablet  3  . ranitidine (ZANTAC) 150 MG tablet  Take 150 mg by mouth 2 (two) times daily.      Marland Kitchen rOPINIRole (REQUIP) 1 MG tablet Take 1 tablet (1 mg total) by mouth at bedtime.  90 tablet  3  . simvastatin (ZOCOR) 80 MG tablet Take 1 tablet (80 mg total) by mouth at bedtime.  90 tablet  3  . tiZANidine (ZANAFLEX) 2 MG tablet Take 1 tablet (2 mg total) by mouth 3 (three) times daily.  270 tablet  0  . traMADol (ULTRAM) 50 MG tablet Take 1 tablet (50 mg total) by mouth every 6 (six) hours as needed.  270 tablet  3   No current facility-administered medications on file prior to visit.    Current Medications (verified) Current Outpatient Prescriptions  Medication Sig Dispense Refill  . ALPRAZolam (XANAX) 0.5 MG tablet 1 po bid prn  180 tablet  0  . aspirin 81 MG EC tablet Take 81 mg by mouth daily.        . Cholecalciferol (VITAMIN D3) 1000 UNITS CAPS Take by mouth daily.        . ferrous fumarate (FERRETTS) 325 (106 FE) MG TABS Take by mouth.        . fluconazole (DIFLUCAN) 100 MG tablet TAKE 1 TABLET BY MOUTH DAILY.  10 tablet  2  . folic acid (FOLVITE) 1 MG tablet Take 1 mg by mouth daily.        . furosemide (LASIX) 40 MG tablet Take 1 tablet (40 mg total) by mouth daily as needed.  90 tablet  3  . levothyroxine (SYNTHROID) 25 MCG tablet Take 1 tablet (25 mcg total) by mouth daily.  90 tablet  3  . metoCLOPramide (REGLAN) 10 MG tablet Take 1 tablet (10 mg total) by mouth at bedtime.  270 tablet  3  . Multiple Vitamin (MULTIVITAMIN) tablet Take 1 tablet by mouth daily.        . potassium chloride SA (K-DUR,KLOR-CON) 20 MEQ tablet Take 1 tablet (20 mEq total) by mouth daily.  90 tablet  3  . promethazine (PHENERGAN) 25 MG tablet Take 25 mg by mouth 2 (two) times daily.      . ranitidine (ZANTAC) 150 MG tablet Take 150 mg by mouth 2 (two) times daily.      Marland Kitchen rOPINIRole (REQUIP) 1 MG tablet Take 1 tablet (1 mg total) by mouth at bedtime.  90 tablet  3  . simvastatin (ZOCOR) 80 MG tablet Take 1 tablet (80 mg total) by mouth at bedtime.  90  tablet  3  . tiZANidine (ZANAFLEX) 2 MG tablet Take 1 tablet (2 mg total) by mouth 3 (three) times daily.  270 tablet  0  . traMADol (ULTRAM) 50 MG tablet Take 1 tablet (50 mg total) by mouth every 6 (six) hours as needed.  270 tablet  3   No current facility-administered medications for this visit.     Allergies (verified) Zofran; Clarithromycin; Codeine; Diazepam; Sulfonamide derivatives; and Prilosec   PAST HISTORY  Family History Family History  Problem Relation Age of Onset  . Parkinsonism Father   . Cancer Other     breast 1st degree relative  . Hypertension Other   . Heart disease Other     CAD.Marland Kitchen1ST DEGREE RELATIVE <60  . Hyperlipidemia Other   . Colon cancer Neg Hx   . Esophageal cancer Neg Hx   . Rectal cancer Neg Hx   . Stomach cancer Neg Hx     Social History History  Substance Use Topics  . Smoking status: Former Games developer  . Smokeless tobacco: Never Used  . Alcohol Use: No     Are there smokers in your home (other than you)? No  Risk Factors Current exercise habits: PT for back  Dietary issues discussed: na   Cardiac risk factors: advanced age (older than 64 for men, 59 for women), dyslipidemia, hypertension and sedentary lifestyle.  Depression Screen (Note: if answer to either of the following is "Yes", a more complete depression screening is indicated)   Over the past two weeks, have you felt down, depressed or hopeless? No  Over the past two weeks, have you felt little interest or pleasure in doing things? No  Have you lost interest or pleasure in daily life? No  Do you often feel hopeless? No  Do you cry easily over simple problems? No  Activities of Daily Living In your present state of health, do you have any difficulty performing the following activities?:  Driving? Yes Managing money?  No Feeding yourself? No Getting from bed to chair? No Climbing a flight of stairs? Yes Preparing food and eating?: No Bathing or showering? No Getting  dressed: No Getting to the toilet? No Using the toilet:No Moving around from place to place: No In the past year have you fallen  or had a near fall?:No   Are you sexually active?  No  Do you have more than one partner?  No  Hearing Difficulties: Yes Do you often ask people to speak up or repeat themselves? Yes----hearing aid R ear,  Completely deaf L ear Do you experience ringing or noises in your ears? No Do you have difficulty understanding soft or whispered voices? Yes   Do you feel that you have a problem with memory? No  Do you often misplace items? No  Do you feel safe at home?  Yes  Cognitive Testing  Alert? Yes  Normal Appearance?Yes  Oriented to person? Yes  Place? Yes   Time? Yes  Recall of three objects?  Yes  Can perform simple calculations? Yes  Displays appropriate judgment?Yes  Can read the correct time from a watch face?Yes   Advanced Directives have been discussed with the patient? Yes  List the Names of Other Physician/Practitioners you currently use: 1.  Dentist-- Tresa Moore 2. oph-- hecker 3   nep-- Balaji 4  GI--jacobs 5  Card--- Excell Seltzer 6  Derm--Jordan Indicate any recent Medical Services you may have received from other than [facility] providers in the past year (date may be approximate).  Immunization History  Administered Date(s) Administered  . Influenza Split 05/26/2011, 05/31/2012  . Influenza Whole 05/31/2008, 05/17/2009, 05/22/2010  . Influenza,inj,Quad PF,36+ Mos 06/06/2013  . Pneumococcal Polysaccharide 06/11/2005    Screening Tests Health Maintenance  Topic Date Due  . Tetanus/tdap  04[birth year]/1949  . Influenza Vaccine  03/25/2013  . Mammogram  11/22/2013  . Colonoscopy  08/31/2020  . Pneumococcal Polysaccharide Vaccine Age 28 And Over  Completed  . Zostavax  Addressed    All answers were reviewed with the patient and necessary referrals were made:  Loreen Freud, DO   06/06/2013   History reviewed:  She  has a past medical history of  Anxiety; Depression; Hyperlipidemia; Anemia; Cataract (05/2011); Thyroid disease; Allergy; GERD (gastroesophageal reflux disease); Myocardial infarction; Osteoporosis; Renal cell carcinoma; and Esophageal yeast infection. She  does not have any pertinent problems on file. She  has past surgical history that includes Coronary artery bypass graft; obstructed ureter; Abdominal hysterectomy; Total abdominal hysterectomy w/ bilateral salpingoophorectomy; Kyphosis surgery; Excision basal cell carcinoma; Eye surgery (05/2011); Tonsillectomy; Appendectomy; and Colonoscopy. Her family history includes Cancer in her other; Heart disease in her other; Hyperlipidemia in her other; Hypertension in her other; Parkinsonism in her father. There is no history of Colon cancer, Esophageal cancer, Rectal cancer, or Stomach cancer. She  reports that she has quit smoking. She has never used smokeless tobacco. She reports that she does not drink alcohol or use illicit drugs. She has a current medication list which includes the following prescription(s): alprazolam, aspirin, vitamin d3, ferrous fumarate, fluconazole, folic acid, furosemide, levothyroxine, metoclopramide, multivitamin, potassium chloride sa, promethazine, ranitidine, ropinirole, simvastatin, tizanidine, and tramadol. Current Outpatient Prescriptions on File Prior to Visit  Medication Sig Dispense Refill  . aspirin 81 MG EC tablet Take 81 mg by mouth daily.        . Cholecalciferol (VITAMIN D3) 1000 UNITS CAPS Take by mouth daily.        . ferrous fumarate (FERRETTS) 325 (106 FE) MG TABS Take by mouth.        . fluconazole (DIFLUCAN) 100 MG tablet TAKE 1 TABLET BY MOUTH DAILY.  10 tablet  2  . folic acid (FOLVITE) 1 MG tablet Take 1 mg by mouth daily.        Marland Kitchen  metoCLOPramide (REGLAN) 10 MG tablet Take 1 tablet (10 mg total) by mouth at bedtime.  270 tablet  3  . Multiple Vitamin (MULTIVITAMIN) tablet Take 1 tablet by mouth daily.        . ranitidine (ZANTAC)  150 MG tablet Take 150 mg by mouth 2 (two) times daily.      Marland Kitchen rOPINIRole (REQUIP) 1 MG tablet Take 1 tablet (1 mg total) by mouth at bedtime.  90 tablet  3  . tiZANidine (ZANAFLEX) 2 MG tablet Take 1 tablet (2 mg total) by mouth 3 (three) times daily.  270 tablet  0   No current facility-administered medications on file prior to visit.   She is allergic to zofran; clarithromycin; codeine; diazepam; sulfonamide derivatives; and prilosec.  Review of Systems Review of Systems  Constitutional: Negative for activity change, appetite change and fatigue.  HENT: Negative for hearing loss, congestion, tinnitus and ear discharge.  dentist q52m Eyes: Negative for visual disturbance (see optho q1y -- vision corrected to 20/20 with glasses).  Respiratory: Negative for cough, chest tightness and shortness of breath.   Cardiovascular: Negative for chest pain, palpitations and leg swelling.  Gastrointestinal: Negative for abdominal pain, diarrhea, constipation and abdominal distention.  Genitourinary: Negative for urgency, frequency, decreased urine volume and difficulty urinating.  Musculoskeletal: Negative for back pain, arthralgias and gait problem.  Skin: Negative for color change, pallor and rash.  Neurological: Negative for dizziness, light-headedness, numbness and headaches.  Hematological: Negative for adenopathy. Does not bruise/bleed easily.  Psychiatric/Behavioral: Negative for suicidal ideas, confusion, sleep disturbance, self-injury, dysphoric mood, decreased concentration and agitation.       Objective:     Vision by Snellen chart: oph Body mass index is 15.27 kg/(m^2). BP 100/58  Pulse 109  Temp(Src) 98.1 F (36.7 C) (Oral)  Ht 5' 3.5" (1.613 m)  Wt 87 lb 9.6 oz (39.735 kg)  BMI 15.27 kg/m2  SpO2 97%  BP 100/58  Pulse 109  Temp(Src) 98.1 F (36.7 C) (Oral)  Ht 5' 3.5" (1.613 m)  Wt 87 lb 9.6 oz (39.735 kg)  BMI 15.27 kg/m2  SpO2 97% General appearance: alert,  cooperative, appears stated age and cachectic Head: Normocephalic, without obvious abnormality, atraumatic Eyes: negative findings: pupils equal, round, reactive to light and accomodation Ears: normal TM's and external ear canals both ears Nose: Nares normal. Septum midline. Mucosa normal. No drainage or sinus tenderness. Throat: lips, mucosa, and tongue normal; teeth and gums normal Neck: no adenopathy, no carotid bruit, no JVD, supple, symmetrical, trachea midline and thyroid not enlarged, symmetric, no tenderness/mass/nodules Back: symmetric, no curvature. ROM normal. No CVA tenderness. Lungs: clear to auscultation bilaterally Breasts: normal appearance, no masses or tenderness Heart: S1, S2 normal Abdomen: soft, non-tender; bowel sounds normal; no masses,  no organomegaly Pelvic: not indicated; post-menopausal, no abnormal Pap smears in past Extremities: extremities normal, atraumatic, no cyanosis or edema Pulses: 2+ and symmetric Skin: Skin color, texture, turgor normal. No rashes or lesions Lymph nodes: Cervical, supraclavicular, and axillary nodes normal. Neurologic: Alert and oriented X 3, normal strength and tone. Normal symmetric reflexes. Normal coordination and gait Psych- no depression, no anxiety      Assessment:     cpe      Plan:    ghm utd Check labs During the course of the visit the patient was educated and counseled about appropriate screening and preventive services including:    Pneumococcal vaccine   Influenza vaccine  Td vaccine  Screening mammography  Bone densitometry screening  Glaucoma screening  Advanced directives: has an advanced directive - a copy HAS NOT been provided.  Diet review for nutrition referral? Yes ____  Not Indicated ___x_   Patient Instructions (the written plan) was given to the patient.  Medicare Attestation I have personally reviewed: The patient's medical and social history Their use of alcohol, tobacco or  illicit drugs Their current medications and supplements The patient's functional ability including ADLs,fall risks, home safety risks, cognitive, and hearing and visual impairment Diet and physical activities Evidence for depression or mood disorders  The patient's weight, height, BMI, and visual acuity have been recorded in the chart.  I have made referrals, counseling, and provided education to the patient based on review of the above and I have provided the patient with a written personalized care plan for preventive services.     Loreen Freud, DO   06/06/2013

## 2013-06-06 NOTE — Assessment & Plan Note (Signed)
Check labs con't meds 

## 2013-06-06 NOTE — Progress Notes (Signed)
  Subjective:    Patient ID: [name], female    DOB: 1927-12-15, 79 y.o.   MRN: [id number]  HPI    Review of Systems     Objective:   Physical Exam Skin-- mole L temple-- not healing                           ? SCC       Assessment & Plan:

## 2013-06-06 NOTE — Patient Instructions (Signed)
Preventive Care for Adults, Female A healthy lifestyle and preventive care can promote health and wellness. Preventive health guidelines for women include the following key practices.  A routine yearly physical is a good way to check with your caregiver about your health and preventive screening. It is a chance to share any concerns and updates on your health, and to receive a thorough exam.  Visit your dentist for a routine exam and preventive care every 6 months. Brush your teeth twice a day and floss once a day. Good oral hygiene prevents tooth decay and gum disease.  The frequency of eye exams is based on your age, health, family medical history, use of contact lenses, and other factors. Follow your caregiver's recommendations for frequency of eye exams.  Eat a healthy diet. Foods like vegetables, fruits, whole grains, low-fat dairy products, and lean protein foods contain the nutrients you need without too many calories. Decrease your intake of foods high in solid fats, added sugars, and salt. Eat the right amount of calories for you.Get information about a proper diet from your caregiver, if necessary.  Regular physical exercise is one of the most important things you can do for your health. Most adults should get at least 150 minutes of moderate-intensity exercise (any activity that increases your heart rate and causes you to sweat) each week. In addition, most adults need muscle-strengthening exercises on 2 or more days a week.  Maintain a healthy weight. The body mass index (BMI) is a screening tool to identify possible weight problems. It provides an estimate of body fat based on height and weight. Your caregiver can help determine your BMI, and can help you achieve or maintain a healthy weight.For adults 20 years and older:  A BMI below 18.5 is considered underweight.  A BMI of 18.5 to 24.9 is normal.  A BMI of 25 to 29.9 is considered overweight.  A BMI of 30 and above is  considered obese.  Maintain normal blood lipids and cholesterol levels by exercising and minimizing your intake of saturated fat. Eat a balanced diet with plenty of fruit and vegetables. Blood tests for lipids and cholesterol should begin at age 20 and be repeated every 5 years. If your lipid or cholesterol levels are high, you are over 50, or you are at high risk for heart disease, you may need your cholesterol levels checked more frequently.Ongoing high lipid and cholesterol levels should be treated with medicines if diet and exercise are not effective.  If you smoke, find out from your caregiver how to quit. If you do not use tobacco, do not start.  If you are pregnant, do not drink alcohol. If you are breastfeeding, be very cautious about drinking alcohol. If you are not pregnant and choose to drink alcohol, do not exceed 1 drink per day. One drink is considered to be 12 ounces (355 mL) of beer, 5 ounces (148 mL) of wine, or 1.5 ounces (44 mL) of liquor.  Avoid use of street drugs. Do not share needles with anyone. Ask for help if you need support or instructions about stopping the use of drugs.  High blood pressure causes heart disease and increases the risk of stroke. Your blood pressure should be checked at least every 1 to 2 years. Ongoing high blood pressure should be treated with medicines if weight loss and exercise are not effective.  If you are 55 to 77 years old, ask your caregiver if you should take aspirin to prevent strokes.  Diabetes   screening involves taking a blood sample to check your fasting blood sugar level. This should be done once every 3 years, after age 45, if you are within normal weight and without risk factors for diabetes. Testing should be considered at a younger age or be carried out more frequently if you are overweight and have at least 1 risk factor for diabetes.  Breast cancer screening is essential preventive care for women. You should practice "breast  self-awareness." This means understanding the normal appearance and feel of your breasts and may include breast self-examination. Any changes detected, no matter how small, should be reported to a caregiver. Women in their 20s and 30s should have a clinical breast exam (CBE) by a caregiver as part of a regular health exam every 1 to 3 years. After age 40, women should have a CBE every year. Starting at age 40, women should consider having a mammography (breast X-ray test) every year. Women who have a family history of breast cancer should talk to their caregiver about genetic screening. Women at a high risk of breast cancer should talk to their caregivers about having magnetic resonance imaging (MRI) and a mammography every year.  The Pap test is a screening test for cervical cancer. A Pap test can show cell changes on the cervix that might become cervical cancer if left untreated. A Pap test is a procedure in which cells are obtained and examined from the lower end of the uterus (cervix).  Women should have a Pap test starting at age 21.  Between ages 21 and 29, Pap tests should be repeated every 2 years.  Beginning at age 30, you should have a Pap test every 3 years as long as the past 3 Pap tests have been normal.  Some women have medical problems that increase the chance of getting cervical cancer. Talk to your caregiver about these problems. It is especially important to talk to your caregiver if a new problem develops soon after your last Pap test. In these cases, your caregiver may recommend more frequent screening and Pap tests.  The above recommendations are the same for women who have or have not gotten the vaccine for human papillomavirus (HPV).  If you had a hysterectomy for a problem that was not cancer or a condition that could lead to cancer, then you no longer need Pap tests. Even if you no longer need a Pap test, a regular exam is a good idea to make sure no other problems are  starting.  If you are between ages 65 and 70, and you have had normal Pap tests going back 10 years, you no longer need Pap tests. Even if you no longer need a Pap test, a regular exam is a good idea to make sure no other problems are starting.  If you have had past treatment for cervical cancer or a condition that could lead to cancer, you need Pap tests and screening for cancer for at least 20 years after your treatment.  If Pap tests have been discontinued, risk factors (such as a new sexual partner) need to be reassessed to determine if screening should be resumed.  The HPV test is an additional test that may be used for cervical cancer screening. The HPV test looks for the virus that can cause the cell changes on the cervix. The cells collected during the Pap test can be tested for HPV. The HPV test could be used to screen women aged 30 years and older, and should   be used in women of any age who have unclear Pap test results. After the age of 30, women should have HPV testing at the same frequency as a Pap test.  Colorectal cancer can be detected and often prevented. Most routine colorectal cancer screening begins at the age of 50 and continues through age 75. However, your caregiver may recommend screening at an earlier age if you have risk factors for colon cancer. On a yearly basis, your caregiver may provide home test kits to check for hidden blood in the stool. Use of a small camera at the end of a tube, to directly examine the colon (sigmoidoscopy or colonoscopy), can detect the earliest forms of colorectal cancer. Talk to your caregiver about this at age 50, when routine screening begins. Direct examination of the colon should be repeated every 5 to 10 years through age 75, unless early forms of pre-cancerous polyps or small growths are found.  Hepatitis C blood testing is recommended for all people born from 1945 through 1965 and any individual with known risks for hepatitis C.  Practice  safe sex. Use condoms and avoid high-risk sexual practices to reduce the spread of sexually transmitted infections (STIs). STIs include gonorrhea, chlamydia, syphilis, trichomonas, herpes, HPV, and human immunodeficiency virus (HIV). Herpes, HIV, and HPV are viral illnesses that have no cure. They can result in disability, cancer, and death. Sexually active women aged 25 and younger should be checked for chlamydia. Older women with new or multiple partners should also be tested for chlamydia. Testing for other STIs is recommended if you are sexually active and at increased risk.  Osteoporosis is a disease in which the bones lose minerals and strength with aging. This can result in serious bone fractures. The risk of osteoporosis can be identified using a bone density scan. Women ages 65 and over and women at risk for fractures or osteoporosis should discuss screening with their caregivers. Ask your caregiver whether you should take a calcium supplement or vitamin D to reduce the rate of osteoporosis.  Menopause can be associated with physical symptoms and risks. Hormone replacement therapy is available to decrease symptoms and risks. You should talk to your caregiver about whether hormone replacement therapy is right for you.  Use sunscreen with sun protection factor (SPF) of 30 or more. Apply sunscreen liberally and repeatedly throughout the day. You should seek shade when your shadow is shorter than you. Protect yourself by wearing long sleeves, pants, a wide-brimmed hat, and sunglasses year round, whenever you are outdoors.  Once a month, do a whole body skin exam, using a mirror to look at the skin on your back. Notify your caregiver of new moles, moles that have irregular borders, moles that are larger than a pencil eraser, or moles that have changed in shape or color.  Stay current with required immunizations.  Influenza. You need a dose every fall (or winter). The composition of the flu vaccine  changes each year, so being vaccinated once is not enough.  Pneumococcal polysaccharide. You need 1 to 2 doses if you smoke cigarettes or if you have certain chronic medical conditions. You need 1 dose at age 65 (or older) if you have never been vaccinated.  Tetanus, diphtheria, pertussis (Tdap, Td). Get 1 dose of Tdap vaccine if you are younger than age 65, are over 65 and have contact with an infant, are a healthcare worker, are pregnant, or simply want to be protected from whooping cough. After that, you need a Td   booster dose every 10 years. Consult your caregiver if you have not had at least 3 tetanus and diphtheria-containing shots sometime in your life or have a deep or dirty wound.  HPV. You need this vaccine if you are a woman age 26 or younger. The vaccine is given in 3 doses over 6 months.  Measles, mumps, rubella (MMR). You need at least 1 dose of MMR if you were born in 1957 or later. You may also need a second dose.  Meningococcal. If you are age 19 to 21 and a first-year college student living in a residence hall, or have one of several medical conditions, you need to get vaccinated against meningococcal disease. You may also need additional booster doses.  Zoster (shingles). If you are age 60 or older, you should get this vaccine.  Varicella (chickenpox). If you have never had chickenpox or you were vaccinated but received only 1 dose, talk to your caregiver to find out if you need this vaccine.  Hepatitis A. You need this vaccine if you have a specific risk factor for hepatitis A virus infection or you simply wish to be protected from this disease. The vaccine is usually given as 2 doses, 6 to 18 months apart.  Hepatitis B. You need this vaccine if you have a specific risk factor for hepatitis B virus infection or you simply wish to be protected from this disease. The vaccine is given in 3 doses, usually over 6 months. Preventive Services / Frequency Ages 19 to 39  Blood  pressure check.** / Every 1 to 2 years.  Lipid and cholesterol check.** / Every 5 years beginning at age 20.  Clinical breast exam.** / Every 3 years for women in their 20s and 30s.  Pap test.** / Every 2 years from ages 21 through 29. Every 3 years starting at age 30 through age 65 or 70 with a history of 3 consecutive normal Pap tests.  HPV screening.** / Every 3 years from ages 30 through ages 65 to 70 with a history of 3 consecutive normal Pap tests.  Hepatitis C blood test.** / For any individual with known risks for hepatitis C.  Skin self-exam. / Monthly.  Influenza immunization.** / Every year.  Pneumococcal polysaccharide immunization.** / 1 to 2 doses if you smoke cigarettes or if you have certain chronic medical conditions.  Tetanus, diphtheria, pertussis (Tdap, Td) immunization. / A one-time dose of Tdap vaccine. After that, you need a Td booster dose every 10 years.  HPV immunization. / 3 doses over 6 months, if you are 26 and younger.  Measles, mumps, rubella (MMR) immunization. / You need at least 1 dose of MMR if you were born in 1957 or later. You may also need a second dose.  Meningococcal immunization. / 1 dose if you are age 19 to 21 and a first-year college student living in a residence hall, or have one of several medical conditions, you need to get vaccinated against meningococcal disease. You may also need additional booster doses.  Varicella immunization.** / Consult your caregiver.  Hepatitis A immunization.** / Consult your caregiver. 2 doses, 6 to 18 months apart.  Hepatitis B immunization.** / Consult your caregiver. 3 doses usually over 6 months. Ages 40 to 64  Blood pressure check.** / Every 1 to 2 years.  Lipid and cholesterol check.** / Every 5 years beginning at age 20.  Clinical breast exam.** / Every year after age 40.  Mammogram.** / Every year beginning at age 40   and continuing for as long as you are in good health. Consult with your  caregiver.  Pap test.** / Every 3 years starting at age 30 through age 65 or 70 with a history of 3 consecutive normal Pap tests.  HPV screening.** / Every 3 years from ages 30 through ages 65 to 70 with a history of 3 consecutive normal Pap tests.  Fecal occult blood test (FOBT) of stool. / Every year beginning at age 50 and continuing until age 75. You may not need to do this test if you get a colonoscopy every 10 years.  Flexible sigmoidoscopy or colonoscopy.** / Every 5 years for a flexible sigmoidoscopy or every 10 years for a colonoscopy beginning at age 50 and continuing until age 75.  Hepatitis C blood test.** / For all people born from 1945 through 1965 and any individual with known risks for hepatitis C.  Skin self-exam. / Monthly.  Influenza immunization.** / Every year.  Pneumococcal polysaccharide immunization.** / 1 to 2 doses if you smoke cigarettes or if you have certain chronic medical conditions.  Tetanus, diphtheria, pertussis (Tdap, Td) immunization.** / A one-time dose of Tdap vaccine. After that, you need a Td booster dose every 10 years.  Measles, mumps, rubella (MMR) immunization. / You need at least 1 dose of MMR if you were born in 1957 or later. You may also need a second dose.  Varicella immunization.** / Consult your caregiver.  Meningococcal immunization.** / Consult your caregiver.  Hepatitis A immunization.** / Consult your caregiver. 2 doses, 6 to 18 months apart.  Hepatitis B immunization.** / Consult your caregiver. 3 doses, usually over 6 months. Ages 65 and over  Blood pressure check.** / Every 1 to 2 years.  Lipid and cholesterol check.** / Every 5 years beginning at age 20.  Clinical breast exam.** / Every year after age 40.  Mammogram.** / Every year beginning at age 40 and continuing for as long as you are in good health. Consult with your caregiver.  Pap test.** / Every 3 years starting at age 30 through age 65 or 70 with a 3  consecutive normal Pap tests. Testing can be stopped between 65 and 70 with 3 consecutive normal Pap tests and no abnormal Pap or HPV tests in the past 10 years.  HPV screening.** / Every 3 years from ages 30 through ages 65 or 70 with a history of 3 consecutive normal Pap tests. Testing can be stopped between 65 and 70 with 3 consecutive normal Pap tests and no abnormal Pap or HPV tests in the past 10 years.  Fecal occult blood test (FOBT) of stool. / Every year beginning at age 50 and continuing until age 75. You may not need to do this test if you get a colonoscopy every 10 years.  Flexible sigmoidoscopy or colonoscopy.** / Every 5 years for a flexible sigmoidoscopy or every 10 years for a colonoscopy beginning at age 50 and continuing until age 75.  Hepatitis C blood test.** / For all people born from 1945 through 1965 and any individual with known risks for hepatitis C.  Osteoporosis screening.** / A one-time screening for women ages 65 and over and women at risk for fractures or osteoporosis.  Skin self-exam. / Monthly.  Influenza immunization.** / Every year.  Pneumococcal polysaccharide immunization.** / 1 dose at age 65 (or older) if you have never been vaccinated.  Tetanus, diphtheria, pertussis (Tdap, Td) immunization. / A one-time dose of Tdap vaccine if you are over   65 and have contact with an infant, are a healthcare worker, or simply want to be protected from whooping cough. After that, you need a Td booster dose every 10 years.  Varicella immunization.** / Consult your caregiver.  Meningococcal immunization.** / Consult your caregiver.  Hepatitis A immunization.** / Consult your caregiver. 2 doses, 6 to 18 months apart.  Hepatitis B immunization.** / Check with your caregiver. 3 doses, usually over 6 months. ** Family history and personal history of risk and conditions may change your caregiver's recommendations. Document Released: 10/07/2001 Document Revised: 11/03/2011  Document Reviewed: 01/06/2011 [employer] Patient Information 2014 [employer].  

## 2013-06-06 NOTE — Assessment & Plan Note (Signed)
Check labs Cont' meds 

## 2013-06-06 NOTE — Assessment & Plan Note (Signed)
   F/u derm  

## 2013-06-07 DIAGNOSIS — D485 Neoplasm of uncertain behavior of skin: Secondary | ICD-10-CM | POA: Diagnosis not present

## 2013-06-07 LAB — POCT URINALYSIS DIPSTICK
Bilirubin, UA: NEGATIVE
Blood, UA: NEGATIVE
Glucose, UA: NEGATIVE
Ketones, UA: NEGATIVE
Nitrite, UA: NEGATIVE
Spec Grav, UA: 1.01

## 2013-06-09 DIAGNOSIS — Z79899 Other long term (current) drug therapy: Secondary | ICD-10-CM | POA: Diagnosis not present

## 2013-06-13 ENCOUNTER — Telehealth: Payer: Self-pay | Admitting: *Deleted

## 2013-06-13 DIAGNOSIS — F419 Anxiety disorder, unspecified: Secondary | ICD-10-CM

## 2013-06-13 DIAGNOSIS — M1990 Unspecified osteoarthritis, unspecified site: Secondary | ICD-10-CM

## 2013-06-13 MED ORDER — TRAMADOL HCL 50 MG PO TABS: 50.0000 mg | ORAL_TABLET | Freq: Four times a day (QID) | ORAL | Status: DC | PRN

## 2013-06-13 MED ORDER — ALPRAZOLAM 0.5 MG PO TABS: ORAL_TABLET | ORAL | Status: DC

## 2013-06-13 NOTE — Telephone Encounter (Signed)
Patient called and stated that the pharmacy ([facility]) did not receive her refills for Tramadol or Xanax. Rx reprinted and faxed to pharmacy.  SW, CMA

## 2013-06-20 ENCOUNTER — Other Ambulatory Visit: Payer: Self-pay

## 2013-06-20 DIAGNOSIS — E785 Hyperlipidemia, unspecified: Secondary | ICD-10-CM

## 2013-06-20 MED ORDER — SIMVASTATIN 80 MG PO TABS: 40.0000 mg | ORAL_TABLET | Freq: Every day | ORAL | Status: DC

## 2013-06-22 ENCOUNTER — Ambulatory Visit (INDEPENDENT_AMBULATORY_CARE_PROVIDER_SITE_OTHER): Payer: MEDICARE | Admitting: Cardiovascular Disease

## 2013-06-22 ENCOUNTER — Encounter: Payer: Self-pay | Admitting: Cardiovascular Disease

## 2013-06-22 VITALS — BP 100/70 | HR 79 | Ht 63.5 in | Wt 87.0 lb

## 2013-06-22 DIAGNOSIS — I252 Old myocardial infarction: Secondary | ICD-10-CM

## 2013-06-22 DIAGNOSIS — E785 Hyperlipidemia, unspecified: Secondary | ICD-10-CM

## 2013-06-22 MED ORDER — SIMVASTATIN 20 MG PO TABS: 20.0000 mg | ORAL_TABLET | Freq: Every day | ORAL | Status: DC

## 2013-06-22 NOTE — Progress Notes (Signed)
HPI:  79 year old woman presenting for followup evaluation. The patient has a history of coronary artery disease status post CABG in 2004. She was seen one year ago with a complaint of tachypalpitations. An echocardiogram was done and this showed no significant abnormalities. Last lipids were checked 06/06/2013 with a cholesterol of 130, triglycerides 92, HDL 67, and LDL 45. Because of her excellent lipid panel her simvastatin was reduced from 80 down to 40 mg.  The patient is having a lot of difficulty with esophageal reflux. She is having frequent symptoms with burning and pressure in the chest. There are no exertional symptoms. She denies shortness of breath, palpitations, edema, lightheadedness, orthopnea, or PND.  Outpatient Encounter Prescriptions as of 06/22/2013  Medication Sig Dispense Refill  . ALPRAZolam (XANAX) 0.5 MG tablet 1 po bid prn  180 tablet  0  . aspirin 81 MG EC tablet Take 81 mg by mouth daily.        . Cholecalciferol (VITAMIN D3) 1000 UNITS CAPS Take by mouth daily.        . ferrous fumarate (FERRETTS) 325 (106 FE) MG TABS Take by mouth.        . fluconazole (DIFLUCAN) 100 MG tablet TAKE 1 TABLET BY MOUTH DAILY.  10 tablet  2  . folic acid (FOLVITE) 1 MG tablet Take 1 mg by mouth daily.        . furosemide (LASIX) 40 MG tablet Take 1 tablet (40 mg total) by mouth daily as needed.  90 tablet  3  . levothyroxine (SYNTHROID) 25 MCG tablet Take 1 tablet (25 mcg total) by mouth daily.  90 tablet  3  . metoCLOPramide (REGLAN) 10 MG tablet Take 1 tablet (10 mg total) by mouth at bedtime.  270 tablet  3  . Multiple Vitamin (MULTIVITAMIN) tablet Take 1 tablet by mouth daily.        . potassium chloride SA (K-DUR,KLOR-CON) 20 MEQ tablet Take 20 mEq by mouth 2 (two) times daily.      . promethazine (PHENERGAN) 25 MG tablet Take 1 tablet (25 mg total) by mouth 2 (two) times daily.  180 tablet  3  . ranitidine (ZANTAC) 150 MG tablet Take 150 mg by mouth 2 (two) times daily.        Marland Kitchen rOPINIRole (REQUIP) 1 MG tablet Take 1 tablet (1 mg total) by mouth at bedtime.  90 tablet  3  . simvastatin (ZOCOR) 80 MG tablet Take 0.5 tablets (40 mg total) by mouth at bedtime.  90 tablet  3  . tiZANidine (ZANAFLEX) 2 MG tablet Take 1 tablet (2 mg total) by mouth 3 (three) times daily.  270 tablet  0  . traMADol (ULTRAM) 50 MG tablet Take 1 tablet (50 mg total) by mouth every 6 (six) hours as needed.  270 tablet  3  . [DISCONTINUED] potassium chloride SA (K-DUR,KLOR-CON) 20 MEQ tablet Take 1 tablet (20 mEq total) by mouth daily.  90 tablet  3   No facility-administered encounter medications on file as of 06/22/2013.    Allergies  Allergen Reactions  . Zofran [Ondansetron Hcl] Anaphylaxis and Other (See Comments)    Tongue swelling, lost of voice  . Clarithromycin     Pt does not remember the reaction  . Codeine     Per the pt, "It made me disoriented"  . Diazepam     Per the pt, "I don't remember the reaction"  . Sulfonamide Derivatives     Per the pt, "It  made me disoriented"  . Prilosec [Omeprazole Magnesium] Rash    Past Medical History  Diagnosis Date  . Anxiety   . Depression   . Hyperlipidemia   . Anemia   . Cataract 05/2011  . Thyroid disease     hypothyroidism  . Allergy   . GERD (gastroesophageal reflux disease)   . Myocardial infarction   . Osteoporosis   . Renal cell carcinoma     found on CT  . Esophageal yeast infection     ROS: Negative except as per HPI  Ht 5' 3.5" (1.613 m)  Wt 87 lb (39.463 kg)  BMI 15.17 kg/m2  PHYSICAL EXAM: Pt is alert and oriented, pleasant, thin elderly woman in NAD HEENT: normal Neck: JVP - normal, carotids 2+= without bruits Lungs: CTA bilaterally CV: RRR without murmur or gallop Abd: soft, NT, Positive BS, no hepatomegaly Ext: no C/C/E, distal pulses intact and equal Skin: warm/dry no rash  EKG:  Normal sinus rhythm 79 beats per minute, within normal limits.  2-D echocardiogram 05/31/2012: Left ventricle:  The cavity size was normal. Wall thickness was normal. Systolic function was normal. The estimated ejection fraction was in the range of 50% to 55%.  ------------------------------------------------------------ Aortic valve: Structurally normal valve. Cusp separation was normal. Doppler: Transvalvular velocity was within the normal range. There was no stenosis. No regurgitation.  ------------------------------------------------------------ Aorta: Aortic root: The aortic root was normal in size. Ascending aorta: The ascending aorta was normal in size.  ------------------------------------------------------------ Mitral valve: Structurally normal valve. Leaflet separation was normal. Doppler: Transvalvular velocity was within the normal range. There was no evidence for stenosis. No regurgitation.  ------------------------------------------------------------ Left atrium: The atrium was normal in size.  ------------------------------------------------------------ Right ventricle: The cavity size was normal. Wall thickness was normal. Systolic function was normal.  ------------------------------------------------------------ Pulmonic valve: Doppler: No significant regurgitation.  ------------------------------------------------------------ Tricuspid valve: Doppler: No significant regurgitation.  ------------------------------------------------------------ Right atrium: The atrium was normal in size.  ------------------------------------------------------------ Pericardium: There was no pericardial effusion.  ASSESSMENT AND PLAN: 1. Coronary artery disease, native vessel. The patient is stable without anginal symptoms. She is 10 years out from CABG. No medical changes were recommended today. She will followup in one year.  2. Hyperlipidemia. Lipids were reviewed. She would like to stop her statin drug because of hair loss. I advised the she continue on it because of her history of  CAD and CABG, but suggested that she reduce her dose further down to simvastatin 20 mg daily.  For followup I will see her back in one year.  Tonny Bollman 06/22/2013 9:23 AM

## 2013-06-22 NOTE — Assessment & Plan Note (Signed)
Underwent CABG 2004

## 2013-06-22 NOTE — Patient Instructions (Signed)
Your physician has recommended you make the following change in your medication: DECREASE Simvastatin to 20mg  take one by mouth every evening  Your physician wants you to follow-up in: 1 YEAR with Dr Excell Seltzer.  You will receive a reminder letter in the mail two months in advance. If you don't receive a letter, please call our office to schedule the follow-up appointment.

## 2013-06-29 ENCOUNTER — Ambulatory Visit (INDEPENDENT_AMBULATORY_CARE_PROVIDER_SITE_OTHER): Payer: MEDICARE | Admitting: Gastroenterology

## 2013-06-29 ENCOUNTER — Encounter: Payer: Self-pay | Admitting: Gastroenterology

## 2013-06-29 VITALS — BP 100/70 | HR 80 | Ht 63.5 in | Wt 87.6 lb

## 2013-06-29 DIAGNOSIS — R1314 Dysphagia, pharyngoesophageal phase: Secondary | ICD-10-CM

## 2013-06-29 MED ORDER — NYSTATIN 100000 UNIT/ML MT SUSP: 5.0000 mL | Freq: Four times a day (QID) | OROMUCOSAL | Status: DC

## 2013-06-29 MED ORDER — FLUCONAZOLE 100 MG PO TABS: 100.0000 mg | ORAL_TABLET | Freq: Every day | ORAL | Status: DC

## 2013-06-29 NOTE — Patient Instructions (Addendum)
Soak your partials in nystatin liquid for 12 hours, clean before and after. Take 10 days of diflucan pill.  Call Dr. Christella Hartigan office in 4-5 weeks to report.

## 2013-06-29 NOTE — Progress Notes (Signed)
Review of pertinent gastrointestinal problems:  1. Adenomatous polyp: Colonoscopy January 2012 (age 79) done for fecal occult positive stool, 14 mm adenoma was removed in piecemeal fashion. I felt was completely resected and did not require any further surveillance given her age.  2. GERD: Had allergic (welts, rash) reaction to prilosec ; EGD 07/2012 Esophageal yeast infection Mild to moderate pan-gastritis, biopsied to check for H. pylori The examination was otherwise normal; biopsies showed chronic gastritis, no H. Pylori. May, 2014 doing well on twice daily H2 blocker, bedtime Reglan. 02/2013 called with recurrent dysphagia, diflucan restarted empirically.  HPI: This is a  will very pleasant 79 year old woman who is here with her daughter today. I last saw her about 4 months ago.   She is very clear that when she takes a Diflucan course her swallowing improved rapidly and states normal for 3-4 weeks and then swallowing difficulty returns. This has been the case with at least 3 or 4 courses of Diflucan over the past 12 months. She is not repeatedly on antibiotics. She is not on the immunosuppressive medicines.  She takes the diflucan course. THis will help very quickly, then the nausea and dysphgia returns about a month later.  Past Medical History  Diagnosis Date  . Anxiety   . Depression   . Hyperlipidemia   . Anemia   . Cataract 05/2011  . Thyroid disease     hypothyroidism  . Allergy   . GERD (gastroesophageal reflux disease)   . Myocardial infarction   . Osteoporosis   . Renal cell carcinoma     found on CT  . Esophageal yeast infection     Past Surgical History  Procedure Laterality Date  . Coronary artery bypass graft    . Obstructed ureter      operative repair  . Abdominal hysterectomy    . Total abdominal hysterectomy w/ bilateral salpingoophorectomy    . Kyphosis surgery    . Basal cell carcinoma excision    . Eye surgery  05/2011  . Tonsillectomy    .  Appendectomy    . Colonoscopy      Current Outpatient Prescriptions  Medication Sig Dispense Refill  . ALPRAZolam (XANAX) 0.5 MG tablet 1 po bid prn  180 tablet  0  . aspirin 81 MG EC tablet Take 81 mg by mouth daily.        . Cholecalciferol (VITAMIN D3) 1000 UNITS CAPS Take by mouth daily.        . ferrous fumarate (FERRETTS) 325 (106 FE) MG TABS Take by mouth.        . fluconazole (DIFLUCAN) 100 MG tablet TAKE 1 TABLET BY MOUTH DAILY.  10 tablet  2  . folic acid (FOLVITE) 1 MG tablet Take 1 mg by mouth daily.        . furosemide (LASIX) 40 MG tablet Take 1 tablet (40 mg total) by mouth daily as needed.  90 tablet  3  . levothyroxine (SYNTHROID) 25 MCG tablet Take 1 tablet (25 mcg total) by mouth daily.  90 tablet  3  . metoCLOPramide (REGLAN) 10 MG tablet Take 1 tablet (10 mg total) by mouth at bedtime.  270 tablet  3  . Multiple Vitamin (MULTIVITAMIN) tablet Take 1 tablet by mouth daily.        . potassium chloride SA (K-DUR,KLOR-CON) 20 MEQ tablet Take 20 mEq by mouth 2 (two) times daily.      . promethazine (PHENERGAN) 25 MG tablet Take 1 tablet (25  mg total) by mouth 2 (two) times daily.  180 tablet  3  . ranitidine (ZANTAC) 150 MG tablet Take 150 mg by mouth 2 (two) times daily.      Marland Kitchen rOPINIRole (REQUIP) 1 MG tablet Take 1 tablet (1 mg total) by mouth at bedtime.  90 tablet  3  . simvastatin (ZOCOR) 20 MG tablet Take 1 tablet (20 mg total) by mouth at bedtime.  90 tablet  3  . tiZANidine (ZANAFLEX) 2 MG tablet Take 1 tablet (2 mg total) by mouth 3 (three) times daily.  270 tablet  0  . traMADol (ULTRAM) 50 MG tablet Take 1 tablet (50 mg total) by mouth every 6 (six) hours as needed.  270 tablet  3   No current facility-administered medications for this visit.    Allergies as of 06/29/2013 - Review Complete 06/29/2013  Allergen Reaction Noted  . Zofran [ondansetron hcl] Anaphylaxis and Other (See Comments) 07/23/2012  . Clarithromycin    . Codeine    . Diazepam    .  Sulfonamide derivatives    . Prilosec [omeprazole magnesium] Rash 05/26/2011    Family History  Problem Relation Age of Onset  . Parkinsonism Father   . Cancer Other     breast 1st degree relative  . Hypertension Other   . Heart disease Other     CAD.Marland Kitchen1ST DEGREE RELATIVE <60  . Hyperlipidemia Other   . Colon cancer Neg Hx   . Esophageal cancer Neg Hx   . Rectal cancer Neg Hx   . Stomach cancer Neg Hx     History   Social History  . Marital Status: Single    Spouse Name: N/A    Number of Children: 2  . Years of Education: N/A   Occupational History  . Retired    Social History Main Topics  . Smoking status: Former Games developer  . Smokeless tobacco: Never Used  . Alcohol Use: No  . Drug Use: No  . Sexual Activity: Not Currently   Other Topics Concern  . Not on file   Social History Narrative   REG EXERCISE   [employer]   LIVES ALONE   END OF LIFE:PATIENT HAS LIVING WILL AND CLEARLY STATES SHE DOES NOT WANT CARDIAC RESUSCITATION,MECHANICLA VENTILATION OR OTHER HEROIC OR FUTILE MEASURES.      Physical Exam: BP 100/70  Pulse 80  Ht 5' 3.5" (1.613 m)  Wt 87 lb 9.6 oz (39.735 kg)  BMI 15.27 kg/m2 Constitutional: Frail and elderly Psychiatric: alert and oriented x3 Abdomen: soft, nontender, nondistended, no obvious ascites, no peritoneal signs, normal bowel sounds  Oral pharynx was clear of any sign of yeast infection. She removed her upper partial plate and there was no clear fungus involved on that either.   Assessment and plan: 79 y.o. female with suspicious for recurrent yeast infection of the esophagus  She is to her daughter are very clear that when she takes Diflucan her swallowing improved rapidly and for at least 3-4 weeks and then the dysphagia returns. She has some nausea and vomiting as well when the dysphagia returns. I examined her partial denture plate but I don't see any clear fungus or yeast infection I am concerned that there might be some there. I'm  going to have her soak her partial plate in a nystatin suspension for 12 hours cleaning them very well before and after. She will resume a course of Diflucan and given her many pills with several refills. She knows to call this  office to report on how her dysphasia has responded hopefully will respond more permanently if indeed I am correct that there is yeast contaminating her partial plate. She normally cleans it with dishwashing detergent.

## 2013-07-05 ENCOUNTER — Encounter: Payer: Self-pay | Admitting: Family Medicine

## 2013-07-17 ENCOUNTER — Encounter (HOSPITAL_BASED_OUTPATIENT_CLINIC_OR_DEPARTMENT_OTHER): Payer: Self-pay | Admitting: Emergency Medicine

## 2013-07-17 ENCOUNTER — Emergency Department (HOSPITAL_BASED_OUTPATIENT_CLINIC_OR_DEPARTMENT_OTHER)
Admission: EM | Admit: 2013-07-17 | Discharge: 2013-07-17 | Disposition: A | Discharge status: Home / Self Care | Payer: MEDICARE | Attending: Emergency Medicine | Admitting: Emergency Medicine

## 2013-07-17 DIAGNOSIS — Z8669 Personal history of other diseases of the nervous system and sense organs: Secondary | ICD-10-CM | POA: Diagnosis not present

## 2013-07-17 DIAGNOSIS — Z87891 Personal history of nicotine dependence: Secondary | ICD-10-CM | POA: Diagnosis not present

## 2013-07-17 DIAGNOSIS — F3289 Other specified depressive episodes: Secondary | ICD-10-CM | POA: Insufficient documentation

## 2013-07-17 DIAGNOSIS — I252 Old myocardial infarction: Secondary | ICD-10-CM | POA: Diagnosis not present

## 2013-07-17 DIAGNOSIS — Z951 Presence of aortocoronary bypass graft: Secondary | ICD-10-CM | POA: Insufficient documentation

## 2013-07-17 DIAGNOSIS — F411 Generalized anxiety disorder: Secondary | ICD-10-CM | POA: Diagnosis not present

## 2013-07-17 DIAGNOSIS — D649 Anemia, unspecified: Secondary | ICD-10-CM | POA: Insufficient documentation

## 2013-07-17 DIAGNOSIS — K219 Gastro-esophageal reflux disease without esophagitis: Secondary | ICD-10-CM | POA: Diagnosis not present

## 2013-07-17 DIAGNOSIS — E785 Hyperlipidemia, unspecified: Secondary | ICD-10-CM | POA: Diagnosis not present

## 2013-07-17 DIAGNOSIS — E039 Hypothyroidism, unspecified: Secondary | ICD-10-CM | POA: Insufficient documentation

## 2013-07-17 DIAGNOSIS — Z79899 Other long term (current) drug therapy: Secondary | ICD-10-CM | POA: Diagnosis not present

## 2013-07-17 DIAGNOSIS — Z8619 Personal history of other infectious and parasitic diseases: Secondary | ICD-10-CM | POA: Insufficient documentation

## 2013-07-17 DIAGNOSIS — Z8739 Personal history of other diseases of the musculoskeletal system and connective tissue: Secondary | ICD-10-CM | POA: Diagnosis not present

## 2013-07-17 DIAGNOSIS — E860 Dehydration: Secondary | ICD-10-CM

## 2013-07-17 DIAGNOSIS — Z7982 Long term (current) use of aspirin: Secondary | ICD-10-CM | POA: Insufficient documentation

## 2013-07-17 DIAGNOSIS — F329 Major depressive disorder, single episode, unspecified: Secondary | ICD-10-CM | POA: Insufficient documentation

## 2013-07-17 DIAGNOSIS — Z85528 Personal history of other malignant neoplasm of kidney: Secondary | ICD-10-CM | POA: Diagnosis not present

## 2013-07-17 HISTORY — DX: Disease of esophagus, unspecified: K22.9

## 2013-07-17 LAB — COMPREHENSIVE METABOLIC PANEL
ALT: 37 U/L — ABNORMAL HIGH (ref 0–35)
AST: 43 U/L — ABNORMAL HIGH (ref 0–37)
Albumin: 3.4 g/dL — ABNORMAL LOW (ref 3.5–5.2)
Alkaline Phosphatase: 76 U/L (ref 39–117)
CO2: 22 mEq/L (ref 19–32)
Chloride: 99 mEq/L (ref 96–112)
Creatinine, Ser: 1.4 mg/dL — ABNORMAL HIGH (ref 0.50–1.10)
GFR calc non Af Amer: 34 mL/min — ABNORMAL LOW (ref >90)
Glucose, Bld: 113 mg/dL — ABNORMAL HIGH (ref 70–99)
Potassium: 4.5 mEq/L (ref 3.5–5.1)
Sodium: 136 mEq/L (ref 135–145)
Total Bilirubin: 0.3 mg/dL (ref 0.3–1.2)

## 2013-07-17 LAB — CBC WITH DIFFERENTIAL/PLATELET
Basophils Absolute: 0 10*3/uL (ref 0.0–0.1)
Basophils Relative: 0 % (ref 0–1)
HCT: 46.8 % — ABNORMAL HIGH (ref 36.0–46.0)
Lymphocytes Relative: 19 % (ref 12–46)
Lymphs Abs: 1.3 10*3/uL (ref 0.7–4.0)
Monocytes Absolute: 1 10*3/uL (ref 0.1–1.0)
Neutro Abs: 4.2 10*3/uL (ref 1.7–7.7)
Neutrophils Relative %: 65 % (ref 43–77)
RDW: 13.5 % (ref 11.5–15.5)
WBC: 6.5 10*3/uL (ref 4.0–10.5)

## 2013-07-17 LAB — URINALYSIS, ROUTINE W REFLEX MICROSCOPIC
Bilirubin Urine: NEGATIVE
Glucose, UA: NEGATIVE mg/dL
Hgb urine dipstick: NEGATIVE
Ketones, ur: NEGATIVE mg/dL
Protein, ur: NEGATIVE mg/dL
Urobilinogen, UA: 0.2 mg/dL (ref 0.0–1.0)

## 2013-07-17 MED ORDER — SODIUM CHLORIDE 0.9 % IV BOLUS (SEPSIS)
1000.0000 mL | Freq: Once | INTRAVENOUS | Status: AC
  Administered 2013-07-17: 1000 mL via INTRAVENOUS

## 2013-07-17 NOTE — ED Notes (Signed)
Pt having generalized weakness for one week.  Pt has similar episodes due to dehydration and failure to thrive.  Pt has history of anorexia.

## 2013-07-17 NOTE — ED Provider Notes (Signed)
CSN: [id number]     Arrival date & time 07/17/13  0940 History   First MD Initiated Contact with Patient 07/17/13 [phone]     Chief Complaint  Patient presents with  . Weakness   (Consider location/radiation/quality/duration/timing/severity/associated sxs/prior Treatment) HPI Comments: Patient presents with generalized weakness. She has this history of esophageal problems with esophageal dysmotility. She has severe esophageal reflux. Given that she's on a strict diet and doesn't have a lot of by mouth intake. Her daughter says that she occasionally gets dehydrated. She feels like her symptoms are consistent with her past episodes of dehydration. She denies any fevers. She denies any cough or chest congestion. She denies any chest tightness or shortness of breath. She denies any cough or cold symptoms. She denies any urinary symptoms. She denies any nausea vomiting or diarrhea. She says that she feels generally weak all over. She denies any unilateral numbness or weakness. She denies any speech deficits or vision changes.  Patient is a 79 y.o. female presenting with weakness.  Weakness Pertinent negatives include no chest pain, no abdominal pain, no headaches and no shortness of breath.    Past Medical History  Diagnosis Date  . Anxiety   . Depression   . Hyperlipidemia   . Anemia   . Cataract 05/2011  . Thyroid disease     hypothyroidism  . Allergy   . GERD (gastroesophageal reflux disease)   . Myocardial infarction   . Osteoporosis   . Renal cell carcinoma     found on CT  . Esophageal yeast infection   . Esophageal disorder    Past Surgical History  Procedure Laterality Date  . Coronary artery bypass graft    . Obstructed ureter      operative repair  . Abdominal hysterectomy    . Total abdominal hysterectomy w/ bilateral salpingoophorectomy    . Kyphosis surgery    . Basal cell carcinoma excision    . Eye surgery  05/2011  . Tonsillectomy    . Appendectomy    .  Colonoscopy     Family History  Problem Relation Age of Onset  . Parkinsonism Father   . Cancer Other     breast 1st degree relative  . Hypertension Other   . Heart disease Other     CAD.Marland Kitchen1ST DEGREE RELATIVE <60  . Hyperlipidemia Other   . Colon cancer Neg Hx   . Esophageal cancer Neg Hx   . Rectal cancer Neg Hx   . Stomach cancer Neg Hx    History  Substance Use Topics  . Smoking status: Former Games developer  . Smokeless tobacco: Never Used  . Alcohol Use: No   OB History   Grav Para Term Preterm Abortions TAB SAB Ect Mult Living                 Review of Systems  Constitutional: Negative for fever, chills, diaphoresis and fatigue.  HENT: Negative for congestion, rhinorrhea and sneezing.   Eyes: Negative.   Respiratory: Negative for cough, chest tightness and shortness of breath.   Cardiovascular: Negative for chest pain and leg swelling.  Gastrointestinal: Negative for nausea, vomiting, abdominal pain, diarrhea and blood in stool.  Genitourinary: Negative for frequency, hematuria, flank pain and difficulty urinating.  Musculoskeletal: Negative for arthralgias and back pain.  Skin: Negative for rash.  Neurological: Positive for weakness. Negative for dizziness, speech difficulty, numbness and headaches.    Allergies  Zofran; Clarithromycin; Codeine; Diazepam; Sulfonamide derivatives; and Prilosec  Home Medications  Current Outpatient Rx  Name  Route  Sig  Dispense  Refill  . ALPRAZolam (XANAX) 0.5 MG tablet      1 po bid prn   180 tablet   0   . aspirin 81 MG EC tablet   Oral   Take 81 mg by mouth daily.           . Cholecalciferol (VITAMIN D3) 1000 UNITS CAPS   Oral   Take by mouth daily.           . ferrous fumarate (FERRETTS) 325 (106 FE) MG TABS   Oral   Take by mouth.           . fluconazole (DIFLUCAN) 100 MG tablet   Oral   Take 1 tablet (100 mg total) by mouth daily.   90 tablet   3   . folic acid (FOLVITE) 1 MG tablet   Oral   Take 1  mg by mouth daily.           . furosemide (LASIX) 40 MG tablet   Oral   Take 1 tablet (40 mg total) by mouth daily as needed.   90 tablet   3   . levothyroxine (SYNTHROID) 25 MCG tablet   Oral   Take 1 tablet (25 mcg total) by mouth daily.   90 tablet   3   . metoCLOPramide (REGLAN) 10 MG tablet   Oral   Take 1 tablet (10 mg total) by mouth at bedtime.   270 tablet   3   . Multiple Vitamin (MULTIVITAMIN) tablet   Oral   Take 1 tablet by mouth daily.           Marland Kitchen nystatin (MYCOSTATIN) 100000 UNIT/ML suspension   Oral   Take 5 mLs (500,000 Units total) by mouth 4 (four) times daily.   60 mL   0   . potassium chloride SA (K-DUR,KLOR-CON) 20 MEQ tablet   Oral   Take 20 mEq by mouth 2 (two) times daily.         . promethazine (PHENERGAN) 25 MG tablet   Oral   Take 1 tablet (25 mg total) by mouth 2 (two) times daily.   180 tablet   3   . ranitidine (ZANTAC) 150 MG tablet   Oral   Take 150 mg by mouth 2 (two) times daily.         Marland Kitchen rOPINIRole (REQUIP) 1 MG tablet   Oral   Take 1 tablet (1 mg total) by mouth at bedtime.   90 tablet   3   . simvastatin (ZOCOR) 20 MG tablet   Oral   Take 1 tablet (20 mg total) by mouth at bedtime.   90 tablet   3   . tiZANidine (ZANAFLEX) 2 MG tablet   Oral   Take 1 tablet (2 mg total) by mouth 3 (three) times daily.   270 tablet   0   . traMADol (ULTRAM) 50 MG tablet   Oral   Take 1 tablet (50 mg total) by mouth every 6 (six) hours as needed.   270 tablet   3    BP 113/65  Pulse 113  Temp(Src) 97.9 F (36.6 C) (Oral)  Resp 16  Ht 5\' 3"  (1.6 m)  Wt 88 lb (39.917 kg)  BMI 15.59 kg/m2  SpO2 100% Physical Exam  Constitutional: She is oriented to person, place, and time. She appears well-developed and well-nourished.  HENT:  Head: Normocephalic and atraumatic.  Eyes: Pupils are equal, round, and reactive to light.  Neck: Normal range of motion. Neck supple.  Cardiovascular: Normal rate, regular rhythm and  normal heart sounds.   Pulmonary/Chest: Effort normal and breath sounds normal. No respiratory distress. She has no wheezes. She has no rales. She exhibits no tenderness.  Abdominal: Soft. Bowel sounds are normal. There is no tenderness. There is no rebound and no guarding.  Musculoskeletal: Normal range of motion. She exhibits no edema.  Lymphadenopathy:    She has no cervical adenopathy.  Neurological: She is alert and oriented to person, place, and time.  Motor 5 out of 5 all extremities. Sensation grossly intact to light touch all extremities. No facial drooping or aphasia.  Skin: Skin is warm and dry. No rash noted.  Psychiatric: She has a normal mood and affect.    ED Course  Procedures (including critical care time) Labs Review Results for orders placed during the hospital encounter of 07/17/13  CBC WITH DIFFERENTIAL      Result Value Range   WBC 6.5  4.0 - 10.5 K/uL   RBC 4.89  3.87 - 5.11 MIL/uL   Hemoglobin 15.6 (*) 12.0 - 15.0 g/dL   HCT 78.2 (*) 95.6 - 21.3 %   MCV 95.7  78.0 - 100.0 fL   MCH 31.9  26.0 - 34.0 pg   MCHC 33.3  30.0 - 36.0 g/dL   RDW 08.6  57.8 - 46.9 %   Platelets 275  150 - 400 K/uL   Neutrophils Relative % 65  43 - 77 %   Neutro Abs 4.2  1.7 - 7.7 K/uL   Lymphocytes Relative 19  12 - 46 %   Lymphs Abs 1.3  0.7 - 4.0 K/uL   Monocytes Relative 15 (*) 3 - 12 %   Monocytes Absolute 1.0  0.1 - 1.0 K/uL   Eosinophils Relative 0  0 - 5 %   Eosinophils Absolute 0.0  0.0 - 0.7 K/uL   Basophils Relative 0  0 - 1 %   Basophils Absolute 0.0  0.0 - 0.1 K/uL  COMPREHENSIVE METABOLIC PANEL      Result Value Range   Sodium 136  135 - 145 mEq/L   Potassium 4.5  3.5 - 5.1 mEq/L   Chloride 99  96 - 112 mEq/L   CO2 22  19 - 32 mEq/L   Glucose, Bld 113 (*) 70 - 99 mg/dL   BUN 17  6 - 23 mg/dL   Creatinine, Ser 6.29 (*) 0.50 - 1.10 mg/dL   Calcium 9.3  8.4 - 52.8 mg/dL   Total Protein 6.7  6.0 - 8.3 g/dL   Albumin 3.4 (*) 3.5 - 5.2 g/dL   AST 43 (*) 0 - 37 U/L    ALT 37 (*) 0 - 35 U/L   Alkaline Phosphatase 76  39 - 117 U/L   Total Bilirubin 0.3  0.3 - 1.2 mg/dL   GFR calc non Af Amer 34 (*) >90 mL/min   GFR calc Af Amer 39 (*) >90 mL/min  URINALYSIS, ROUTINE W REFLEX MICROSCOPIC      Result Value Range   Color, Urine YELLOW  YELLOW   APPearance CLEAR  CLEAR   Specific Gravity, Urine 1.014  1.005 - 1.030   pH 5.5  5.0 - 8.0   Glucose, UA NEGATIVE  NEGATIVE mg/dL   Hgb urine dipstick NEGATIVE  NEGATIVE   Bilirubin Urine NEGATIVE  NEGATIVE   Ketones, ur NEGATIVE  NEGATIVE mg/dL  Protein, ur NEGATIVE  NEGATIVE mg/dL   Urobilinogen, UA 0.2  0.0 - 1.0 mg/dL   Nitrite NEGATIVE  NEGATIVE   Leukocytes, UA NEGATIVE  NEGATIVE   No results found.   Imaging Review No results found.  EKG Interpretation    Date/Time:  Sunday July 17 2013 10:39:27 EST Ventricular Rate:  90 PR Interval:  132 QRS Duration: 74 QT Interval:  366 QTC Calculation: 447 R Axis:   71 Text Interpretation:  Normal sinus rhythm Normal ECG since last tracing no significant change Confirmed by Tiana Sivertson  MD, Patrisia Faeth ([id number]) on 07/17/2013 11:51:45 AM            MDM   1. Dehydration    Patient given IV fluids. Check her blood work is unremarkable and similar to her baseline values. Her EKG did not show any ischemic changes. Her urinalysis was negative for infection. She has no focal neurologic deficits. She has no fever or infectious sounding etiology. Her symptoms are consistent with her past episodes of dehydration. She was given IV fluids and encouraged to followup with her primary care physician if her symptoms are not improving.    Rolan Bucco, MD 07/17/13 [phone]

## 2013-08-01 ENCOUNTER — Encounter: Payer: Self-pay | Admitting: Family Medicine

## 2013-08-01 ENCOUNTER — Other Ambulatory Visit: Payer: Self-pay | Admitting: *Deleted

## 2013-08-01 MED ORDER — POTASSIUM CHLORIDE CRYS ER 20 MEQ PO TBCR: 20.0000 meq | EXTENDED_RELEASE_TABLET | Freq: Two times a day (BID) | ORAL | Status: DC

## 2013-08-01 NOTE — Telephone Encounter (Signed)
Potassium chloride refilled per protocol.  

## 2013-08-02 ENCOUNTER — Other Ambulatory Visit: Payer: Self-pay

## 2013-08-02 MED ORDER — POTASSIUM CHLORIDE CRYS ER 20 MEQ PO TBCR: 20.0000 meq | EXTENDED_RELEASE_TABLET | Freq: Two times a day (BID) | ORAL | Status: DC

## 2013-08-02 NOTE — Telephone Encounter (Signed)
Spoke with [name] and she stated she would like a 30 day supply for the Potassium sent to [facility].  Rx sent      KP

## 2013-08-10 DIAGNOSIS — N200 Calculus of kidney: Secondary | ICD-10-CM | POA: Diagnosis not present

## 2013-08-10 DIAGNOSIS — N289 Disorder of kidney and ureter, unspecified: Secondary | ICD-10-CM | POA: Diagnosis not present

## 2013-08-11 DIAGNOSIS — H524 Presbyopia: Secondary | ICD-10-CM | POA: Diagnosis not present

## 2013-08-11 DIAGNOSIS — H02409 Unspecified ptosis of unspecified eyelid: Secondary | ICD-10-CM | POA: Diagnosis not present

## 2013-08-11 DIAGNOSIS — Z961 Presence of intraocular lens: Secondary | ICD-10-CM | POA: Diagnosis not present

## 2013-08-11 DIAGNOSIS — H01009 Unspecified blepharitis unspecified eye, unspecified eyelid: Secondary | ICD-10-CM | POA: Diagnosis not present

## 2013-08-11 DIAGNOSIS — H40019 Open angle with borderline findings, low risk, unspecified eye: Secondary | ICD-10-CM | POA: Diagnosis not present

## 2013-08-26 ENCOUNTER — Telehealth: Payer: Self-pay | Admitting: Family Medicine

## 2013-08-26 DIAGNOSIS — I10 Essential (primary) hypertension: Secondary | ICD-10-CM

## 2013-08-26 DIAGNOSIS — E785 Hyperlipidemia, unspecified: Secondary | ICD-10-CM

## 2013-08-26 NOTE — Telephone Encounter (Signed)
Decrease zocor 40 mg #30 1 po qhs K low--- increase supplement to 2 a day Recheck 3 months----272.4 401.9 Lipid, bmp, hep    Order have been put in for [facility] lab.       KP

## 2013-08-26 NOTE — Telephone Encounter (Signed)
Patient's daughter is calling on her behalf to request that orders be put in for the patient to have her potassium checked at the lab on [facility]  after 09/07/13. Please put in orders.

## 2013-08-31 DIAGNOSIS — N289 Disorder of kidney and ureter, unspecified: Secondary | ICD-10-CM | POA: Diagnosis not present

## 2013-09-07 ENCOUNTER — Other Ambulatory Visit: Payer: Self-pay | Admitting: *Deleted

## 2013-09-07 ENCOUNTER — Telehealth: Payer: Self-pay | Admitting: *Deleted

## 2013-09-07 MED ORDER — LORATADINE 10 MG PO TABS: 10.0000 mg | ORAL_TABLET | Freq: Every day | ORAL | Status: DC

## 2013-09-07 MED ORDER — FLUTICASONE PROPIONATE 50 MCG/ACT NA SUSP: 2.0000 | Freq: Every day | NASAL | Status: DC

## 2013-09-07 NOTE — Telephone Encounter (Signed)
Both medications sent to [facility]. JG//CMA

## 2013-09-07 NOTE — Telephone Encounter (Signed)
Flonase, 2 sprays each nostril qd, claritin 10 mg  Po qd  #30   Ov if no better --- we really can not do abx without ov

## 2013-09-07 NOTE — Telephone Encounter (Signed)
Patient's daughter called and stated that patient has been sick since last week with a possible sinus infection. She states they have tried Mucinex, Afrin, and other cold medications with no relief. She state she would prefer not to bring patient in because of her condition. She would like to know if something could be sent in for her. Please advise. JG//CMA

## 2013-09-07 NOTE — Telephone Encounter (Signed)
Called and left message for patient's daughter informing her of Dr. Nonda Lou recommendations. JG//CMA

## 2013-09-27 ENCOUNTER — Other Ambulatory Visit: Payer: Self-pay

## 2013-09-27 ENCOUNTER — Telehealth: Payer: Self-pay | Admitting: *Deleted

## 2013-09-27 DIAGNOSIS — M8580 Other specified disorders of bone density and structure, unspecified site: Secondary | ICD-10-CM

## 2013-09-27 NOTE — Telephone Encounter (Signed)
Patient called and states that she has an apt for a bone density test on April 6th at [place]. Patient needs a referral faxed.

## 2013-09-27 NOTE — Telephone Encounter (Signed)
Order faxed to [facility]

## 2013-10-06 ENCOUNTER — Other Ambulatory Visit (INDEPENDENT_AMBULATORY_CARE_PROVIDER_SITE_OTHER): Payer: MEDICARE

## 2013-10-06 DIAGNOSIS — I10 Essential (primary) hypertension: Secondary | ICD-10-CM

## 2013-10-06 DIAGNOSIS — E785 Hyperlipidemia, unspecified: Secondary | ICD-10-CM

## 2013-10-06 DIAGNOSIS — Q619 Cystic kidney disease, unspecified: Secondary | ICD-10-CM | POA: Diagnosis not present

## 2013-10-06 DIAGNOSIS — N289 Disorder of kidney and ureter, unspecified: Secondary | ICD-10-CM | POA: Diagnosis not present

## 2013-10-06 LAB — BASIC METABOLIC PANEL
BUN: 11 mg/dL (ref 6–23)
CHLORIDE: 105 meq/L (ref 96–112)
CO2: 26 meq/L (ref 19–32)
Calcium: 9.3 mg/dL (ref 8.4–10.5)
Creatinine, Ser: 1.2 mg/dL (ref 0.4–1.2)
GFR: 45.99 mL/min — ABNORMAL LOW (ref >60.00)
Glucose, Bld: 130 mg/dL — ABNORMAL HIGH (ref 70–99)
POTASSIUM: 4.1 meq/L (ref 3.5–5.1)
SODIUM: 140 meq/L (ref 135–145)

## 2013-10-06 LAB — HEPATIC FUNCTION PANEL
ALBUMIN: 3.1 g/dL — AB (ref 3.5–5.2)
ALT: 26 U/L (ref 0–35)
AST: 31 U/L (ref 0–37)
Alkaline Phosphatase: 55 U/L (ref 39–117)
Bilirubin, Direct: 0 mg/dL (ref 0.0–0.3)
Total Bilirubin: 0.8 mg/dL (ref 0.3–1.2)
Total Protein: 5.8 g/dL — ABNORMAL LOW (ref 6.0–8.3)

## 2013-10-06 LAB — LIPID PANEL
CHOL/HDL RATIO: 2
Cholesterol: 132 mg/dL (ref 0–200)
HDL: 61.6 mg/dL (ref >39.00)
LDL CALC: 50 mg/dL (ref 0–99)
Triglycerides: 100 mg/dL (ref 0.0–149.0)
VLDL: 20 mg/dL (ref 0.0–40.0)

## 2013-10-18 ENCOUNTER — Encounter: Payer: Self-pay | Admitting: Family Medicine

## 2013-10-19 ENCOUNTER — Other Ambulatory Visit: Payer: Self-pay | Admitting: Family Medicine

## 2013-10-19 DIAGNOSIS — R739 Hyperglycemia, unspecified: Secondary | ICD-10-CM

## 2013-10-27 ENCOUNTER — Other Ambulatory Visit (INDEPENDENT_AMBULATORY_CARE_PROVIDER_SITE_OTHER): Payer: MEDICARE

## 2013-10-27 DIAGNOSIS — R7309 Other abnormal glucose: Secondary | ICD-10-CM | POA: Diagnosis not present

## 2013-10-27 DIAGNOSIS — R739 Hyperglycemia, unspecified: Secondary | ICD-10-CM

## 2013-10-27 LAB — BASIC METABOLIC PANEL
BUN: 9 mg/dL (ref 6–23)
CO2: 25 meq/L (ref 19–32)
Calcium: 9 mg/dL (ref 8.4–10.5)
Chloride: 106 mEq/L (ref 96–112)
Creatinine, Ser: 1 mg/dL (ref 0.4–1.2)
GFR: 54.94 mL/min — AB (ref >60.00)
GLUCOSE: 78 mg/dL (ref 70–99)
POTASSIUM: 4 meq/L (ref 3.5–5.1)
Sodium: 138 mEq/L (ref 135–145)

## 2013-10-27 LAB — HEMOGLOBIN A1C: Hgb A1c MFr Bld: 5.8 % (ref 4.6–6.5)

## 2013-11-16 ENCOUNTER — Telehealth: Payer: Self-pay | Admitting: Family Medicine

## 2013-11-16 DIAGNOSIS — F419 Anxiety disorder, unspecified: Secondary | ICD-10-CM

## 2013-11-16 DIAGNOSIS — M1990 Unspecified osteoarthritis, unspecified site: Secondary | ICD-10-CM

## 2013-11-16 DIAGNOSIS — G2581 Restless legs syndrome: Secondary | ICD-10-CM

## 2013-11-16 NOTE — Telephone Encounter (Signed)
Ok to refill all x1

## 2013-11-16 NOTE — Telephone Encounter (Signed)
Waiting on Dr. Etter Sjogren to review.

## 2013-11-16 NOTE — Telephone Encounter (Addendum)
Patient called and requested refills for Ropinirole for 90 tablets, Alprazolam for 270 tablets and Tramadol 270 tablets.  Pharmacy [facility]

## 2013-11-16 NOTE — Telephone Encounter (Signed)
Overdue for UDS I will not fill those meds in such a large quantity since I don't know pt- will forward to PCP to review

## 2013-11-16 NOTE — Telephone Encounter (Signed)
Patient is requesting a refill the following medications. Last OV 06/06/13 CPE  Xanax  last filled 06/13/13 #270  Ropinirole last filled 05/31/13 #90  Tramadol last filled 06/13/13 # 270  Last UDS 11/10/12 Low Risk Okay to refill?  [place] pt

## 2013-11-17 MED ORDER — ALPRAZOLAM 0.5 MG PO TABS: ORAL_TABLET | ORAL | Status: DC

## 2013-11-17 MED ORDER — TRAMADOL HCL 50 MG PO TABS: 50.0000 mg | ORAL_TABLET | Freq: Four times a day (QID) | ORAL | Status: DC | PRN

## 2013-11-17 MED ORDER — ROPINIROLE HCL 1 MG PO TABS: 1.0000 mg | ORAL_TABLET | Freq: Every day | ORAL | Status: DC

## 2013-11-17 MED ORDER — ROPINIROLE HCL 1 MG PO TABS
1.0000 mg | ORAL_TABLET | Freq: Every day | ORAL | Status: DC
Start: 2013-11-17 — End: 2013-11-17

## 2013-11-17 NOTE — Telephone Encounter (Signed)
Rx printed, signed and faxed to [facility] per pt request.

## 2013-11-25 ENCOUNTER — Encounter: Payer: Self-pay | Admitting: Family Medicine

## 2013-11-28 DIAGNOSIS — D1801 Hemangioma of skin and subcutaneous tissue: Secondary | ICD-10-CM | POA: Diagnosis not present

## 2013-11-28 DIAGNOSIS — M810 Age-related osteoporosis without current pathological fracture: Secondary | ICD-10-CM | POA: Diagnosis not present

## 2013-11-28 DIAGNOSIS — D239 Other benign neoplasm of skin, unspecified: Secondary | ICD-10-CM | POA: Diagnosis not present

## 2013-11-28 DIAGNOSIS — Z1231 Encounter for screening mammogram for malignant neoplasm of breast: Secondary | ICD-10-CM | POA: Diagnosis not present

## 2013-11-28 DIAGNOSIS — L821 Other seborrheic keratosis: Secondary | ICD-10-CM | POA: Diagnosis not present

## 2013-11-28 LAB — HM DEXA SCAN

## 2013-11-28 LAB — HM MAMMOGRAPHY

## 2013-12-22 ENCOUNTER — Other Ambulatory Visit: Payer: Self-pay | Admitting: Family Medicine

## 2014-01-18 ENCOUNTER — Encounter: Payer: Self-pay | Admitting: Gastroenterology

## 2014-01-20 ENCOUNTER — Telehealth: Payer: Self-pay | Admitting: Gastroenterology

## 2014-01-20 ENCOUNTER — Telehealth: Payer: Self-pay | Admitting: Family Medicine

## 2014-01-20 DIAGNOSIS — F419 Anxiety disorder, unspecified: Secondary | ICD-10-CM

## 2014-01-20 MED ORDER — POTASSIUM CHLORIDE CRYS ER 20 MEQ PO TBCR: 20.0000 meq | EXTENDED_RELEASE_TABLET | Freq: Two times a day (BID) | ORAL | Status: DC

## 2014-01-20 MED ORDER — ALPRAZOLAM 0.5 MG PO TABS: ORAL_TABLET | ORAL | Status: DC

## 2014-01-20 NOTE — Telephone Encounter (Signed)
Last seen 06/06/13 and filled 11/17/13 #180. Please advise     KP

## 2014-01-20 NOTE — Telephone Encounter (Signed)
Caller name:Gissela Elbert Relation to OI:[id number] Call back number: [phone] Pharmacy:[facility]  Reason for call: to request refills for potassium and Xanax 180 qty

## 2014-01-20 NOTE — Telephone Encounter (Signed)
The pt was scheduled for 03/24/14 she was offered to put on a wait list but the daughter cant get off work with out notice.  She was advised to call if the pt worsens in the mean time

## 2014-01-20 NOTE — Telephone Encounter (Signed)
Refill x1 each 

## 2014-01-21 ENCOUNTER — Other Ambulatory Visit: Payer: Self-pay | Admitting: Gastroenterology

## 2014-01-22 ENCOUNTER — Other Ambulatory Visit: Payer: Self-pay | Admitting: Gastroenterology

## 2014-01-23 ENCOUNTER — Telehealth: Payer: Self-pay | Admitting: Gastroenterology

## 2014-01-23 NOTE — Telephone Encounter (Signed)
Dr Ardis Hughs,  Per e-mail response to patient on 01/20/14, patient is to soak dentures every day x 1 week-10 days. However, the last rx I see for the nystatin was from 06/2013 for 5 ml QID. The corresponding office note from that day indicates that patient should soak dentures for 12 hours at a time. Patient seems to be confused and I want to make certain that I give her the correct instructions. Please advise. Do you want her to soak dentures 12 hours at a time x 7-10 days?

## 2014-01-24 NOTE — Telephone Encounter (Signed)
My chart message sent, she was advised if she still has questions she can call

## 2014-02-07 DIAGNOSIS — D4959 Neoplasm of unspecified behavior of other genitourinary organ: Secondary | ICD-10-CM | POA: Diagnosis not present

## 2014-02-07 DIAGNOSIS — Q619 Cystic kidney disease, unspecified: Secondary | ICD-10-CM | POA: Diagnosis not present

## 2014-03-16 DIAGNOSIS — H40019 Open angle with borderline findings, low risk, unspecified eye: Secondary | ICD-10-CM | POA: Diagnosis not present

## 2014-03-24 ENCOUNTER — Ambulatory Visit (INDEPENDENT_AMBULATORY_CARE_PROVIDER_SITE_OTHER): Payer: MEDICARE | Admitting: Gastroenterology

## 2014-03-24 ENCOUNTER — Encounter: Payer: Self-pay | Admitting: Gastroenterology

## 2014-03-24 VITALS — BP 90/68 | HR 88 | Ht 63.5 in | Wt 85.0 lb

## 2014-03-24 DIAGNOSIS — R1314 Dysphagia, pharyngoesophageal phase: Secondary | ICD-10-CM | POA: Diagnosis not present

## 2014-03-24 DIAGNOSIS — R634 Abnormal weight loss: Secondary | ICD-10-CM | POA: Diagnosis not present

## 2014-03-24 DIAGNOSIS — R109 Unspecified abdominal pain: Secondary | ICD-10-CM | POA: Diagnosis not present

## 2014-03-24 MED ORDER — NYSTATIN 100000 UNIT/ML MT SUSP: 10.0000 mL | Freq: Four times a day (QID) | OROMUCOSAL | Status: DC

## 2014-03-24 MED ORDER — FLUCONAZOLE 100 MG PO TABS: 100.0000 mg | ORAL_TABLET | Freq: Every day | ORAL | Status: DC

## 2014-03-24 NOTE — Progress Notes (Signed)
Review of pertinent gastrointestinal problems:  1. Adenomatous polyp: Colonoscopy January 2012 (age 80) done for fecal occult positive stool, 14 mm adenoma was removed in piecemeal fashion. I felt was completely resected and did not require any further surveillance given her age.  2. GERD: Had allergic (welts, rash) reaction to prilosec ; EGD 07/2012 Esophageal yeast infection Mild to moderate pan-gastritis, biopsied to check for H. pylori The examination was otherwise normal; biopsies showed chronic gastritis, no H. Pylori. May, 2014 doing well on twice daily H2 blocker, bedtime Reglan. 02/2013 called with recurrent dysphagia, diflucan restarted empirically.  12/2013 intermittent diflucan treatment helps her swallowing.  She started soaking her dentures in nystatin every 7-10 days.   HPI: This is a   very pleasant 80 year old woman who I saw last several months ago. She is here with her daughter today.  Swallowing has improved.  Has been soaking about weekly. Swallowing has been better but not completely normal.  She has had abdominal left upper quadrant. This is chronic, constant pain.  Dull in nature. No associated nausea or vomiting.  Thin but not losing weight.    Past Medical History  Diagnosis Date  . Anxiety   . Depression   . Hyperlipidemia   . Anemia   . Cataract 05/2011  . Thyroid disease     hypothyroidism  . Allergy   . GERD (gastroesophageal reflux disease)   . Myocardial infarction   . Osteoporosis   . Renal cell carcinoma     found on CT  . Esophageal yeast infection   . Esophageal disorder     Past Surgical History  Procedure Laterality Date  . Coronary artery bypass graft    . Obstructed ureter      operative repair  . Abdominal hysterectomy    . Total abdominal hysterectomy w/ bilateral salpingoophorectomy    . Kyphosis surgery    . Basal cell carcinoma excision    . Eye surgery  05/2011  . Tonsillectomy    . Appendectomy    . Colonoscopy       Current Outpatient Prescriptions  Medication Sig Dispense Refill  . ALPRAZolam (XANAX) 0.5 MG tablet 1 po bid prn  180 tablet  0  . aspirin 81 MG EC tablet Take 81 mg by mouth daily.        . Cholecalciferol (VITAMIN D3) 1000 UNITS CAPS Take by mouth daily.        . ferrous fumarate (FERRETTS) 325 (106 FE) MG TABS Take by mouth.        . fluconazole (DIFLUCAN) 100 MG tablet Take 1 tablet (100 mg total) by mouth daily.  90 tablet  3  . fluticasone (FLONASE) 50 MCG/ACT nasal spray Place 2 sprays into both nostrils daily.  16 g  6  . folic acid (FOLVITE) 1 MG tablet Take 1 mg by mouth daily.        . furosemide (LASIX) 40 MG tablet Take 1 tablet (40 mg total) by mouth daily as needed.  90 tablet  1  . levothyroxine (SYNTHROID, LEVOTHROID) 25 MCG tablet Take 1 tablet by mouth  daily  90 tablet  1  . metoCLOPramide (REGLAN) 10 MG tablet Take 1 tablet (10 mg total) by mouth at bedtime.  270 tablet  3  . Multiple Vitamin (MULTIVITAMIN) tablet Take 1 tablet by mouth daily.        Marland Kitchen nystatin (MYCOSTATIN) 100000 UNIT/ML suspension TAKE 5 MLS BY MOUTH 4 (FOUR) TIMES DAILY.  240 mL  0  . potassium chloride SA (K-DUR,KLOR-CON) 20 MEQ tablet Take 1 tablet (20 mEq total) by mouth 2 (two) times daily.  180 tablet  1  . promethazine (PHENERGAN) 25 MG tablet Take 1 tablet by mouth two  times daily  180 tablet  1  . ranitidine (ZANTAC) 150 MG tablet Take 150 mg by mouth 2 (two) times daily.      Marland Kitchen rOPINIRole (REQUIP) 1 MG tablet Take 1 tablet (1 mg total) by mouth at bedtime.  90 tablet  3  . simvastatin (ZOCOR) 20 MG tablet Take 1 tablet (20 mg total) by mouth at bedtime.  90 tablet  3  . tiZANidine (ZANAFLEX) 2 MG tablet Take 1 tablet (2 mg total) by mouth 3 (three) times daily.  270 tablet  0  . traMADol (ULTRAM) 50 MG tablet Take 1 tablet (50 mg total) by mouth every 6 (six) hours as needed.  270 tablet  3   No current facility-administered medications for this visit.    Allergies as of 03/24/2014 -  Review Complete 03/24/2014  Allergen Reaction Noted  . Zofran [ondansetron hcl] Anaphylaxis and Other (See Comments) 07/23/2012  . Clarithromycin    . Codeine    . Diazepam    . Sulfonamide derivatives    . Prilosec [omeprazole magnesium] Rash 05/26/2011    Family History  Problem Relation Age of Onset  . Parkinsonism Father   . Cancer Other     breast 1st degree relative  . Hypertension Other   . Heart disease Other     CAD.Marland Kitchen1ST DEGREE RELATIVE <60  . Hyperlipidemia Other   . Colon cancer Neg Hx   . Esophageal cancer Neg Hx   . Rectal cancer Neg Hx   . Stomach cancer Neg Hx     History   Social History  . Marital Status: Single    Spouse Name: N/A    Number of Children: 2  . Years of Education: N/A   Occupational History  . Retired    Social History Main Topics  . Smoking status: Former Research scientist (life sciences)  . Smokeless tobacco: Never Used  . Alcohol Use: No  . Drug Use: No  . Sexual Activity: Not Currently   Other Topics Concern  . Not on file   Social History Narrative   REG EXERCISE   [employer]   LIVES ALONE   END OF LIFE:PATIENT HAS LIVING WILL AND CLEARLY STATES SHE DOES NOT WANT CARDIAC [place] VENTILATION OR OTHER HEROIC OR FUTILE MEASURES.      Physical Exam: Ht 5' 3.5" (1.613 m)  Wt 85 lb (38.556 kg)  BMI 14.82 kg/m2 Constitutional: very frail, cachectic appearing Psychiatric: alert and oriented x3 Abdomen: soft, nontender, nondistended, no obvious ascites, no peritoneal signs, normal bowel sounds     Assessment and plan: 80 y.o. female with with intermittent esophageal yeast infections, chronic swallowing difficulty. Left-sided abdominal pains.  I have refill her nystatin which she uses to soak her dentures every week or so. She will also continue taking intermittent Diflucan. She has lost to 3 pounds since her last visit. She has left-sided abdominal pains I think a CT scan is reasonable to this point. She does have a known right renal  mass which is probably a cancer that is just being followed.

## 2014-03-24 NOTE — Patient Instructions (Addendum)
You will be set up for a CT scan of abdomen and pelvis with IV and oral contrast for left sided pain and weight loss.  You have been scheduled for a CT scan of the abdomen and pelvis at [place] ([address]---this is in the same building as Press [facility]).   You are scheduled on 03/28/14 at 9 am. You should arrive 15 minutes prior to your appointment time for registration. Please follow the written instructions below on the day of your exam:  WARNING: IF YOU ARE ALLERGIC TO IODINE/X-RAY DYE, PLEASE NOTIFY RADIOLOGY IMMEDIATELY AT [phone]! YOU WILL BE GIVEN A 13 HOUR PREMEDICATION PREP.  1) Do not eat or drink anything after 5 am (4 hours prior to your test) 2) You have been given 2 bottles of oral contrast to drink. The solution may taste better if refrigerated, but do NOT add ice or any other liquid to this solution. Shake  well before drinking.    Drink 1 bottle of contrast @ 7 am (2 hours prior to your exam)  Drink 1 bottle of contrast @ 8 am (1 hour prior to your exam)  You may take any medications as prescribed with a small amount of water except for the following: Metformin, Glucophage, Glucovance, Avandamet, Riomet, Fortamet, Actoplus Met, Janumet, Glumetza or Metaglip. The above medications must be held the day of the exam AND 48 hours after the exam.  The purpose of you drinking the oral contrast is to aid in the visualization of your intestinal tract. The contrast solution may cause some diarrhea. Before your exam is started, you will be given a small amount of fluid to drink. Depending on your individual set of symptoms, you may also receive an intravenous injection of x-ray contrast/dye. Plan on being at [facility] for 30 minutes or long, depending on the type of exam you are having performed.  This test typically takes 30-45 minutes to complete.  If you have any questions regarding your exam or if you need to reschedule, you may call the CT  department at [phone] between the hours of 8:00 am and 5:00 pm, Monday-Friday.  ________________________________________________________________________  Refill of nystatin and Diflucan with several refills.

## 2014-03-27 ENCOUNTER — Encounter: Payer: Self-pay | Admitting: Gastroenterology

## 2014-03-27 ENCOUNTER — Telehealth: Payer: Self-pay | Admitting: Gastroenterology

## 2014-03-27 ENCOUNTER — Other Ambulatory Visit: Payer: Self-pay

## 2014-03-27 DIAGNOSIS — R634 Abnormal weight loss: Secondary | ICD-10-CM

## 2014-03-27 DIAGNOSIS — R109 Unspecified abdominal pain: Secondary | ICD-10-CM

## 2014-03-27 NOTE — Progress Notes (Signed)
You have been scheduled for a CT scan of the abdomen and pelvis at [place]..   You are scheduled on 04/03/14 at 830 am . You should arrive 15 minutes prior to your appointment time for registration. Please follow the written instructions below on the day of your exam:  WARNING: IF YOU ARE ALLERGIC TO IODINE/X-RAY DYE, PLEASE NOTIFY RADIOLOGY IMMEDIATELY AT [phone]! YOU WILL BE GIVEN A 13 HOUR PREMEDICATION PREP.  1) Do not eat or drink anything after 430 am (4 hours prior to your test) 2) You have been given 2 bottles of oral contrast to drink. The solution may taste better if refrigerated, but do NOT add ice or any other liquid to this solution. Shake well before drinking.    Drink 1 bottle of contrast @ 630 am (2 hours prior to your exam)  Drink 1 bottle of contrast @ 730 am (1 hour prior to your exam)  You may take any medications as prescribed with a small amount of water except for the following: Metformin, Glucophage, Glucovance, Avandamet, Riomet, Fortamet, Actoplus Met, Janumet, Glumetza or Metaglip. The above medications must be held the day of the exam AND 48 hours after the exam.    Pt is aware of the instructions  The purpose of you drinking the oral contrast is to aid in the visualization of your intestinal tract. The contrast solution may cause some diarrhea. Before your exam is started, you will be given a small amount of fluid to drink. Depending on your individual set of symptoms, you may also receive an intravenous injection of x-ray contrast/dye. Plan on being at [facility] for 30 minutes or long, depending on the type of exam you are having performed.  This test typically takes 30-45 minutes to complete.  If you have any questions regarding your exam or if you need to reschedule, you may call the CT department at [phone] between the hours of 8:00 am and 5:00 pm,  Monday-Friday.  ________________________________________________________________________

## 2014-03-27 NOTE — Telephone Encounter (Signed)
Pt daughter was given the number to radiology to reschedule the appt

## 2014-03-28 ENCOUNTER — Other Ambulatory Visit: Payer: MEDICARE

## 2014-04-03 ENCOUNTER — Other Ambulatory Visit: Payer: MEDICARE

## 2014-04-05 ENCOUNTER — Other Ambulatory Visit (INDEPENDENT_AMBULATORY_CARE_PROVIDER_SITE_OTHER): Payer: MEDICARE

## 2014-04-05 DIAGNOSIS — R634 Abnormal weight loss: Secondary | ICD-10-CM | POA: Diagnosis not present

## 2014-04-05 DIAGNOSIS — R1314 Dysphagia, pharyngoesophageal phase: Secondary | ICD-10-CM | POA: Diagnosis not present

## 2014-04-05 DIAGNOSIS — R109 Unspecified abdominal pain: Secondary | ICD-10-CM | POA: Diagnosis not present

## 2014-04-05 LAB — BUN: BUN: 14 mg/dL (ref 6–23)

## 2014-04-05 LAB — CREATININE, SERUM: CREATININE: 0.8 mg/dL (ref 0.4–1.2)

## 2014-04-06 ENCOUNTER — Encounter: Payer: Self-pay | Admitting: Gastroenterology

## 2014-04-10 ENCOUNTER — Ambulatory Visit
Admission: RE | Admit: 2014-04-10 | Discharge: 2014-04-10 | Disposition: A | Discharge status: Home / Self Care | Payer: MEDICARE | Source: Ambulatory Visit | Attending: Gastroenterology | Admitting: Gastroenterology

## 2014-04-10 ENCOUNTER — Other Ambulatory Visit: Payer: MEDICARE

## 2014-04-10 DIAGNOSIS — R109 Unspecified abdominal pain: Secondary | ICD-10-CM

## 2014-04-10 DIAGNOSIS — R634 Abnormal weight loss: Secondary | ICD-10-CM

## 2014-04-10 MED ORDER — IOHEXOL 300 MG/ML  SOLN
75.0000 mL | Freq: Once | INTRAMUSCULAR | Status: AC | PRN
  Administered 2014-04-10: 75 mL via INTRAVENOUS

## 2014-04-11 ENCOUNTER — Encounter: Payer: Self-pay | Admitting: Gastroenterology

## 2014-06-09 ENCOUNTER — Telehealth: Payer: Self-pay | Admitting: Family Medicine

## 2014-06-09 DIAGNOSIS — F419 Anxiety disorder, unspecified: Secondary | ICD-10-CM

## 2014-06-09 DIAGNOSIS — E785 Hyperlipidemia, unspecified: Secondary | ICD-10-CM

## 2014-06-09 DIAGNOSIS — I252 Old myocardial infarction: Secondary | ICD-10-CM

## 2014-06-09 NOTE — Telephone Encounter (Signed)
Caller name: [place] Relation to pt: self  Call back number: [phone] [facility]  Reason for call:   Patient is requesting a refill of all of them

## 2014-06-12 ENCOUNTER — Encounter: Payer: MEDICARE | Admitting: Family Medicine

## 2014-06-12 MED ORDER — FUROSEMIDE 40 MG PO TABS: ORAL_TABLET | ORAL | Status: DC

## 2014-06-12 MED ORDER — SIMVASTATIN 20 MG PO TABS: 20.0000 mg | ORAL_TABLET | Freq: Every day | ORAL | Status: DC

## 2014-06-12 MED ORDER — ALPRAZOLAM 0.5 MG PO TABS: ORAL_TABLET | ORAL | Status: DC

## 2014-06-12 MED ORDER — LEVOTHYROXINE SODIUM 25 MCG PO TABS: ORAL_TABLET | ORAL | Status: DC

## 2014-06-12 MED ORDER — PROMETHAZINE HCL 25 MG PO TABS: ORAL_TABLET | ORAL | Status: DC

## 2014-06-12 MED ORDER — POTASSIUM CHLORIDE CRYS ER 20 MEQ PO TBCR: 20.0000 meq | EXTENDED_RELEASE_TABLET | Freq: Two times a day (BID) | ORAL | Status: DC

## 2014-06-12 NOTE — Telephone Encounter (Signed)
Alprazolam faxed to [facility].

## 2014-06-12 NOTE — Telephone Encounter (Signed)
Spoke with patient who needs refill on Simvastatin, Lasix, Synthroid, Promethazine, Potassium, and alprazolam.   Patient was rescheduled for December due to PCP out of the office.   Alprazolam last filled 11/2013 Last OV: 05/2013 UDS: positive 05/2013  Please advise on alprazolam refill.

## 2014-06-12 NOTE — Telephone Encounter (Signed)
uds low rx printed

## 2014-06-22 ENCOUNTER — Ambulatory Visit (INDEPENDENT_AMBULATORY_CARE_PROVIDER_SITE_OTHER): Payer: MEDICARE | Admitting: Cardiovascular Disease

## 2014-06-22 ENCOUNTER — Encounter: Payer: Self-pay | Admitting: Cardiovascular Disease

## 2014-06-22 VITALS — BP 96/58 | HR 80 | Ht 63.5 in | Wt 86.4 lb

## 2014-06-22 DIAGNOSIS — E785 Hyperlipidemia, unspecified: Secondary | ICD-10-CM | POA: Diagnosis not present

## 2014-06-22 NOTE — Patient Instructions (Signed)
Your physician has recommended you make the following change in your medication:  1. STOP Simvastatin  Your physician wants you to follow-up in: 1 YEAR with Dr Burt Knack.  You will receive a reminder letter in the mail two months in advance. If you don't receive a letter, please call our office to schedule the follow-up appointment.

## 2014-06-22 NOTE — Progress Notes (Addendum)
Background: The patient is followed for coronary artery disease. She underwent multivessel CABG in 2004.  HPI:  80 year old woman presenting for follow-up evaluation. She was last seen 1 year ago. She reports no cardiac symptoms. Specifically denies chest pain, chest pressure, shortness of breath, or heart palpitations. She has had no leg swelling, orthopnea, or PND. She's had problems with esophageal yeast infection. Her diet is somewhat limited. She feels like she is "slow down a little" over the past year, but attributes this to aging. No other specific complaints.  Studies:  Lipid Panel 10/06/2013    Component Value Date/Time   CHOL 132 10/06/2013 0746   TRIG 100.0 10/06/2013 0746   HDL 61.60 10/06/2013 0746   CHOLHDL 2 10/06/2013 0746   VLDL 20.0 10/06/2013 0746   LDLCALC 50 10/06/2013 0746   LDLDIRECT 165.1 05/10/2008 0000   2-D echocardiogram 05/31/2012: Study Conclusions  Left ventricle: The cavity size was normal. Wall thickness was normal. Systolic function was normal. The estimated ejection fraction was in the range of 50% to 55%.    Outpatient Encounter Prescriptions as of 06/22/2014  Medication Sig  . ALPRAZolam (XANAX) 0.5 MG tablet 1 po bid prn  . aspirin 81 MG EC tablet Take 81 mg by mouth daily.    . Cholecalciferol (VITAMIN D3) 1000 UNITS CAPS Take by mouth daily.    . ferrous fumarate (FERRETTS) 325 (106 FE) MG TABS Take by mouth.    . fluconazole (DIFLUCAN) 100 MG tablet Take 1 tablet (100 mg total) by mouth daily.  . fluticasone (FLONASE) 50 MCG/ACT nasal spray Place 2 sprays into both nostrils daily.  . folic acid (FOLVITE) 1 MG tablet Take 1 mg by mouth daily.    . furosemide (LASIX) 40 MG tablet Take 1 tablet (40 mg total) by mouth daily as needed.  Marland Kitchen levothyroxine (SYNTHROID, LEVOTHROID) 25 MCG tablet Take 1 tablet by mouth  daily  . metoCLOPramide (REGLAN) 10 MG tablet Take 1 tablet (10 mg total) by mouth at bedtime.  . Multiple Vitamin (MULTIVITAMIN)  tablet Take 1 tablet by mouth daily.    Marland Kitchen nystatin (MYCOSTATIN) 100000 UNIT/ML suspension Take 10 mLs (1,000,000 Units total) by mouth 4 (four) times daily.  . potassium chloride SA (K-DUR,KLOR-CON) 20 MEQ tablet Take 1 tablet (20 mEq total) by mouth 2 (two) times daily.  . promethazine (PHENERGAN) 25 MG tablet Take 1 tablet by mouth two  times daily  . ranitidine (ZANTAC) 150 MG tablet Take 150 mg by mouth 2 (two) times daily.  Marland Kitchen rOPINIRole (REQUIP) 1 MG tablet Take 1 tablet (1 mg total) by mouth at bedtime.  . simvastatin (ZOCOR) 20 MG tablet Take 1 tablet (20 mg total) by mouth at bedtime.  Marland Kitchen tiZANidine (ZANAFLEX) 2 MG tablet Take 1 tablet (2 mg total) by mouth 3 (three) times daily.  . traMADol (ULTRAM) 50 MG tablet Take 1 tablet (50 mg total) by mouth every 6 (six) hours as needed.    Allergies  Allergen Reactions  . Zofran [Ondansetron Hcl] Anaphylaxis and Other (See Comments)    Tongue swelling, lost of voice  . Clarithromycin     Pt does not remember the reaction  . Codeine     Per the pt, "It made me disoriented"  . Diazepam     Per the pt, "I don't remember the reaction"  . Sulfonamide Derivatives     Per the pt, "It made me disoriented"  . Prilosec [Omeprazole Magnesium] Rash    Past Medical History  Diagnosis Date  . Anxiety   . Depression   . Hyperlipidemia   . Anemia   . Cataract 05/2011  . Thyroid disease     hypothyroidism  . Allergy   . GERD (gastroesophageal reflux disease)   . Myocardial infarction   . Osteoporosis   . Renal cell carcinoma     found on CT  . Esophageal yeast infection   . Esophageal disorder     family history includes Cancer in her other; Heart disease in her other; Hyperlipidemia in her other; Hypertension in her other; Parkinsonism in her father. There is no history of Colon cancer, Esophageal cancer, Rectal cancer, or Stomach cancer.   ROS: Negative except as per HPI  BP 96/58  Pulse 80  Ht 5' 3.5" (1.613 m)  Wt 86 lb 6.4 oz  (39.191 kg)  BMI 15.06 kg/m2  PHYSICAL EXAM: Pt is alert and oriented, very thin elderly woman in NAD HEENT: normal Neck: JVP - normal, carotids 2+= without bruits Lungs: CTA bilaterally CV: RRR without murmur or gallop Abd: soft, NT, Positive BS Ext: no C/C/E, distal pulses intact and equal Skin: warm/dry no rash  EKG:  Normal sinus rhythm 80 bpm, nonspecific T-wave abnormality.  ASSESSMENT AND PLAN: 1. Coronary artery disease, native vessel, without symptoms of angina. 2. Hyperlipidemia  The patient is stable from a cardiac perspective. She continues on aspirin for antiplatelet therapy. I reviewed her risk/benefit of continuing a statin drug. I think she has severe protein calorie malnutrition and is at extremely low body weight with a BMI of 15. Considering her frailty and advanced age, I recommended that she stop her simvastatin at this point. I will see her back in 1 year for follow-up.  Sherren Mocha, MD 06/22/2014 8:48 AM  ADDENDUM 09/02/2013: Received request for cardiac clearance for cryoablation of renal mass. Pt recently seen in office without cardiac complaints. She is stable from my perspective and can proceed at low risk of cardiac complications. Please call if any problems arise.  Sherren Mocha 09/02/2014 2:37 PM

## 2014-07-14 DIAGNOSIS — D495 Neoplasm of unspecified behavior of other genitourinary organs: Secondary | ICD-10-CM | POA: Diagnosis not present

## 2014-07-14 DIAGNOSIS — N29 Other disorders of kidney and ureter in diseases classified elsewhere: Secondary | ICD-10-CM | POA: Diagnosis not present

## 2014-07-25 ENCOUNTER — Telehealth: Payer: Self-pay | Admitting: Family Medicine

## 2014-07-25 NOTE — Telephone Encounter (Signed)
Caller name: Alaylah, [place] Relation to pt: self  Call back number: [phone]   Reason for call: pt requesting requesting a referral to ear specialist  Lonzo Candy [phone] (p) [phone] (f)

## 2014-07-25 NOTE — Telephone Encounter (Signed)
Did she happen to say what for?

## 2014-07-25 NOTE — Telephone Encounter (Addendum)
Pt states she can not hear

## 2014-08-02 ENCOUNTER — Other Ambulatory Visit: Payer: Self-pay | Admitting: Urology

## 2014-08-02 DIAGNOSIS — D495 Neoplasm of unspecified behavior of other genitourinary organs: Secondary | ICD-10-CM | POA: Diagnosis not present

## 2014-08-02 DIAGNOSIS — N2889 Other specified disorders of kidney and ureter: Secondary | ICD-10-CM

## 2014-08-11 ENCOUNTER — Encounter: Payer: Self-pay | Admitting: Family Medicine

## 2014-08-11 ENCOUNTER — Ambulatory Visit (INDEPENDENT_AMBULATORY_CARE_PROVIDER_SITE_OTHER): Payer: MEDICARE | Admitting: Family Medicine

## 2014-08-11 ENCOUNTER — Other Ambulatory Visit: Payer: Self-pay | Admitting: Family Medicine

## 2014-08-11 VITALS — BP 98/64 | HR 113 | Temp 97.7°F | Ht 63.5 in | Wt 83.4 lb

## 2014-08-11 DIAGNOSIS — E039 Hypothyroidism, unspecified: Secondary | ICD-10-CM | POA: Diagnosis not present

## 2014-08-11 DIAGNOSIS — H9193 Unspecified hearing loss, bilateral: Secondary | ICD-10-CM | POA: Diagnosis not present

## 2014-08-11 DIAGNOSIS — F419 Anxiety disorder, unspecified: Secondary | ICD-10-CM | POA: Diagnosis not present

## 2014-08-11 DIAGNOSIS — E785 Hyperlipidemia, unspecified: Secondary | ICD-10-CM

## 2014-08-11 DIAGNOSIS — Z23 Encounter for immunization: Secondary | ICD-10-CM

## 2014-08-11 DIAGNOSIS — I10 Essential (primary) hypertension: Secondary | ICD-10-CM

## 2014-08-11 DIAGNOSIS — Z0000 Encounter for general adult medical examination without abnormal findings: Secondary | ICD-10-CM | POA: Diagnosis not present

## 2014-08-11 DIAGNOSIS — G2581 Restless legs syndrome: Secondary | ICD-10-CM | POA: Diagnosis not present

## 2014-08-11 DIAGNOSIS — M159 Polyosteoarthritis, unspecified: Secondary | ICD-10-CM

## 2014-08-11 DIAGNOSIS — R110 Nausea: Secondary | ICD-10-CM

## 2014-08-11 LAB — CBC WITH DIFFERENTIAL/PLATELET
BASOS ABS: 0 10*3/uL (ref 0.0–0.1)
Basophils Relative: 0.5 % (ref 0.0–3.0)
Eosinophils Absolute: 0 10*3/uL (ref 0.0–0.7)
Eosinophils Relative: 0.2 % (ref 0.0–5.0)
HEMATOCRIT: 45.5 % (ref 36.0–46.0)
HEMOGLOBIN: 14.9 g/dL (ref 12.0–15.0)
LYMPHS ABS: 2.1 10*3/uL (ref 0.7–4.0)
Lymphocytes Relative: 24.4 % (ref 12.0–46.0)
MCHC: 32.8 g/dL (ref 30.0–36.0)
MCV: 97.6 fl (ref 78.0–100.0)
Monocytes Absolute: 0.8 10*3/uL (ref 0.1–1.0)
Monocytes Relative: 8.7 % (ref 3.0–12.0)
NEUTROS ABS: 5.8 10*3/uL (ref 1.4–7.7)
Neutrophils Relative %: 66.2 % (ref 43.0–77.0)
PLATELETS: 317 10*3/uL (ref 150.0–400.0)
RBC: 4.66 Mil/uL (ref 3.87–5.11)
RDW: 13 % (ref 11.5–15.5)
WBC: 8.8 10*3/uL (ref 4.0–10.5)

## 2014-08-11 LAB — LIPID PANEL
CHOLESTEROL: 245 mg/dL — AB (ref 0–200)
HDL: 67.3 mg/dL (ref >39.00)
LDL Cholesterol: 157 mg/dL — ABNORMAL HIGH (ref 0–99)
NonHDL: 177.7
Total CHOL/HDL Ratio: 4
Triglycerides: 105 mg/dL (ref 0.0–149.0)
VLDL: 21 mg/dL (ref 0.0–40.0)

## 2014-08-11 LAB — HEPATIC FUNCTION PANEL
ALBUMIN: 3.9 g/dL (ref 3.5–5.2)
ALT: 28 U/L (ref 0–35)
AST: 30 U/L (ref 0–37)
Alkaline Phosphatase: 62 U/L (ref 39–117)
BILIRUBIN TOTAL: 0.5 mg/dL (ref 0.2–1.2)
Bilirubin, Direct: 0 mg/dL (ref 0.0–0.3)
Total Protein: 7 g/dL (ref 6.0–8.3)

## 2014-08-11 LAB — BASIC METABOLIC PANEL
BUN: 22 mg/dL (ref 6–23)
CO2: 24 meq/L (ref 19–32)
Calcium: 9 mg/dL (ref 8.4–10.5)
Chloride: 97 mEq/L (ref 96–112)
Creatinine, Ser: 1.7 mg/dL — ABNORMAL HIGH (ref 0.4–1.2)
GFR: 31.26 mL/min — ABNORMAL LOW (ref >60.00)
Glucose, Bld: 92 mg/dL (ref 70–99)
Potassium: 3.1 mEq/L — ABNORMAL LOW (ref 3.5–5.1)
SODIUM: 134 meq/L — AB (ref 135–145)

## 2014-08-11 MED ORDER — FUROSEMIDE 40 MG PO TABS: ORAL_TABLET | ORAL | Status: DC

## 2014-08-11 MED ORDER — ROPINIROLE HCL 1 MG PO TABS: 1.0000 mg | ORAL_TABLET | Freq: Every day | ORAL | Status: DC

## 2014-08-11 MED ORDER — LEVOTHYROXINE SODIUM 25 MCG PO TABS: ORAL_TABLET | ORAL | Status: DC

## 2014-08-11 MED ORDER — TRAMADOL HCL 50 MG PO TABS: 50.0000 mg | ORAL_TABLET | Freq: Four times a day (QID) | ORAL | Status: DC | PRN

## 2014-08-11 MED ORDER — ALPRAZOLAM 0.5 MG PO TABS: ORAL_TABLET | ORAL | Status: DC

## 2014-08-11 MED ORDER — PROMETHAZINE HCL 25 MG PO TABS: ORAL_TABLET | ORAL | Status: DC

## 2014-08-11 MED ORDER — POTASSIUM CHLORIDE CRYS ER 20 MEQ PO TBCR: 20.0000 meq | EXTENDED_RELEASE_TABLET | Freq: Two times a day (BID) | ORAL | Status: DC

## 2014-08-11 NOTE — Progress Notes (Signed)
Subjective:    [name] is a 80 y.o. female who presents for Medicare Annual/Subsequent preventive examination.  Preventive Screening-Counseling & Management  Tobacco History  Smoking status  . Former Smoker  Smokeless tobacco  . Never Used     Problems Prior to Visit 1.   Current Problems (verified) Patient Active Problem List   Diagnosis Date Noted  . Suspicious mole 06/06/2013  . Allergic drug reaction 07/23/2012  . RLS (restless legs syndrome) 05/31/2012  . Insomnia 05/26/2011  . Osteopenia 05/26/2011  . HYPOTHYROIDISM 05/10/2008  . HYPERCALCEMIA 03/16/2008  . NONSPECIFIC ABNORM RESULTS KIDNEY FUNCTION STUDY 03/16/2008  . UTI 03/06/2008  . WEAKNESS 03/06/2008  . HYPERLIPIDEMIA 04/12/2007  . ARTIFICIAL MENOPAUSE 04/12/2007  . ANXIETY 01/12/2007  . DEPRESSION 01/12/2007  . MYOCARDIAL INFARCTION, HX OF 01/12/2007    Medications Prior to Visit Current Outpatient Prescriptions on File Prior to Visit  Medication Sig Dispense Refill  . aspirin 81 MG EC tablet Take 81 mg by mouth daily.      . Cholecalciferol (VITAMIN D3) 1000 UNITS CAPS Take by mouth daily.      . ferrous fumarate (FERRETTS) 325 (106 FE) MG TABS Take by mouth.      . fluconazole (DIFLUCAN) 100 MG tablet Take 1 tablet (100 mg total) by mouth daily. 90 tablet 3  . fluticasone (FLONASE) 50 MCG/ACT nasal spray Place 2 sprays into both nostrils daily. 16 g 6  . folic acid (FOLVITE) 1 MG tablet Take 1 mg by mouth daily.      . metoCLOPramide (REGLAN) 10 MG tablet Take 1 tablet (10 mg total) by mouth at bedtime. 270 tablet 3  . Multiple Vitamin (MULTIVITAMIN) tablet Take 1 tablet by mouth daily.      Marland Kitchen nystatin (MYCOSTATIN) 100000 UNIT/ML suspension Take 10 mLs (1,000,000 Units total) by mouth 4 (four) times daily. 240 mL 6  . ranitidine (ZANTAC) 150 MG tablet Take 150 mg by mouth 2 (two) times daily.    Marland Kitchen tiZANidine (ZANAFLEX) 2 MG tablet Take 1 tablet (2 mg total) by mouth 3 (three) times daily. 270  tablet 0   No current facility-administered medications on file prior to visit.    Current Medications (verified) Current Outpatient Prescriptions  Medication Sig Dispense Refill  . ALPRAZolam (XANAX) 0.5 MG tablet 1 po bid prn 180 tablet 3  . aspirin 81 MG EC tablet Take 81 mg by mouth daily.      . Cholecalciferol (VITAMIN D3) 1000 UNITS CAPS Take by mouth daily.      . ferrous fumarate (FERRETTS) 325 (106 FE) MG TABS Take by mouth.      . fluconazole (DIFLUCAN) 100 MG tablet Take 1 tablet (100 mg total) by mouth daily. 90 tablet 3  . fluticasone (FLONASE) 50 MCG/ACT nasal spray Place 2 sprays into both nostrils daily. 16 g 6  . folic acid (FOLVITE) 1 MG tablet Take 1 mg by mouth daily.      . furosemide (LASIX) 40 MG tablet Take 1 tablet (40 mg total) by mouth daily as needed. 90 tablet 3  . levothyroxine (SYNTHROID, LEVOTHROID) 25 MCG tablet Take 1 tablet by mouth  daily 90 tablet 3  . metoCLOPramide (REGLAN) 10 MG tablet Take 1 tablet (10 mg total) by mouth at bedtime. 270 tablet 3  . Multiple Vitamin (MULTIVITAMIN) tablet Take 1 tablet by mouth daily.      Marland Kitchen nystatin (MYCOSTATIN) 100000 UNIT/ML suspension Take 10 mLs (1,000,000 Units total) by mouth 4 (four) times  daily. 240 mL 6  . potassium chloride SA (K-DUR,KLOR-CON) 20 MEQ tablet Take 1 tablet (20 mEq total) by mouth 2 (two) times daily. 180 tablet 3  . promethazine (PHENERGAN) 25 MG tablet Take 1 tablet by mouth two  times daily 180 tablet 3  . ranitidine (ZANTAC) 150 MG tablet Take 150 mg by mouth 2 (two) times daily.    Marland Kitchen rOPINIRole (REQUIP) 1 MG tablet Take 1 tablet (1 mg total) by mouth at bedtime. 90 tablet 3  . tiZANidine (ZANAFLEX) 2 MG tablet Take 1 tablet (2 mg total) by mouth 3 (three) times daily. 270 tablet 0  . traMADol (ULTRAM) 50 MG tablet Take 1 tablet (50 mg total) by mouth every 6 (six) hours as needed. 270 tablet 3   No current facility-administered medications for this visit.     Allergies  (verified) Zofran; Clarithromycin; Codeine; Diazepam; Sulfonamide derivatives; and Prilosec   PAST HISTORY  Family History Family History  Problem Relation Age of Onset  . Parkinsonism Father   . Cancer Other     breast 1st degree relative  . Hypertension Other   . Heart disease Other     CAD.Marland Kitchen1ST DEGREE RELATIVE <60  . Hyperlipidemia Other   . Colon cancer Neg Hx   . Esophageal cancer Neg Hx   . Rectal cancer Neg Hx   . Stomach cancer Neg Hx     Social History History  Substance Use Topics  . Smoking status: Former Research scientist (life sciences)  . Smokeless tobacco: Never Used  . Alcohol Use: No     Are there smokers in your home (other than you)? No  Risk Factors Current exercise habits: The patient does not participate in regular exercise at present.  Dietary issues discussed: na   Cardiac risk factors: advanced age (older than 53 for men, 73 for women), dyslipidemia, hypertension and sedentary lifestyle.  Depression Screen (Note: if answer to either of the following is "Yes", a more complete depression screening is indicated)   Over the past two weeks, have you felt down, depressed or hopeless? No  Over the past two weeks, have you felt little interest or pleasure in doing things? No  Have you lost interest or pleasure in daily life? No  Do you often feel hopeless? No  Do you cry easily over simple problems? No  Activities of Daily Living In your present state of health, do you have any difficulty performing the following activities?:  Driving? Yes Managing money?  No Feeding yourself? No Getting from bed to chair? No Climbing a flight of stairs? No Preparing food and eating?: No Bathing or showering? No Getting dressed: No Getting to the toilet? No Using the toilet:No Moving around from place to place: No In the past year have you fallen or had a near fall?:No   Are you sexually active?  No  Do you have more than one partner?  No  Hearing Difficulties: yes- hearing  aids Do you often ask people to speak up or repeat themselves? Yes Do you experience ringing or noises in your ears? No Do you have difficulty understanding soft or whispered voices? Yes   Do you feel that you have a problem with memory? No  Do you often misplace items? No  Do you feel safe at home?  Yes  Cognitive Testing  Alert? Yes  Normal Appearance?Yes  Oriented to person? Yes  Place? Yes   Time? Yes  Recall of three objects?  Yes  Can perform simple calculations? Yes  Displays appropriate judgment?Yes  Can read the correct time from a watch face?Yes   Advanced Directives have been discussed with the patient? Yes  List the Names of Other Physician/Practitioners you currently use: 1. Dentist-- Otila Kluver 2. opth-- hecker 3. nep- balaji 4. GI--jacobs 5.card--cooper 6.derm-jordan   Indicate any recent Medical Services you may have received from other than [facility] providers in the past year (date may be approximate).  Immunization History  Administered Date(s) Administered  . Influenza Split 05/26/2011, 05/31/2012  . Influenza Whole 05/31/2008, 05/17/2009, 05/22/2010  . Influenza, High Dose Seasonal PF 05/21/2014  . Influenza,inj,Quad PF,36+ Mos 06/06/2013  . Pneumococcal Polysaccharide-23 06/11/2005    Screening Tests Health Maintenance  Topic Date Due  . TETANUS/TDAP  04/21/1947  . MAMMOGRAM  11/29/2014  . INFLUENZA VACCINE  03/26/2015  . COLONOSCOPY  08/31/2020  . PNEUMOCOCCAL POLYSACCHARIDE VACCINE AGE 26 AND OVER  Completed  . ZOSTAVAX  Addressed    All answers were reviewed with the patient and necessary referrals were made:  Garnet Koyanagi, DO   08/11/2014   History reviewed:  She  has a past medical history of Anxiety; Depression; Hyperlipidemia; Anemia; Cataract (05/2011); Thyroid disease; Allergy; GERD (gastroesophageal reflux disease); Myocardial infarction; Osteoporosis; Renal cell carcinoma; Esophageal yeast infection; and Esophageal disorder. She  does  not have any pertinent problems on file. She  has past surgical history that includes Coronary artery bypass graft; obstructed ureter; Abdominal hysterectomy; Total abdominal hysterectomy w/ bilateral salpingoophorectomy; Kyphosis surgery; Excision basal cell carcinoma; Eye surgery (05/2011); Tonsillectomy; Appendectomy; and Colonoscopy. Her family history includes Cancer in her other; Heart disease in her other; Hyperlipidemia in her other; Hypertension in her other; Parkinsonism in her father. There is no history of Colon cancer, Esophageal cancer, Rectal cancer, or Stomach cancer. She  reports that she has quit smoking. She has never used smokeless tobacco. She reports that she does not drink alcohol or use illicit drugs. She has a current medication list which includes the following prescription(s): alprazolam, aspirin, vitamin d3, ferrous fumarate, fluconazole, fluticasone, folic acid, furosemide, levothyroxine, metoclopramide, multivitamin, nystatin, potassium chloride sa, promethazine, ranitidine, ropinirole, tizanidine, and tramadol. Current Outpatient Prescriptions on File Prior to Visit  Medication Sig Dispense Refill  . aspirin 81 MG EC tablet Take 81 mg by mouth daily.      . Cholecalciferol (VITAMIN D3) 1000 UNITS CAPS Take by mouth daily.      . ferrous fumarate (FERRETTS) 325 (106 FE) MG TABS Take by mouth.      . fluconazole (DIFLUCAN) 100 MG tablet Take 1 tablet (100 mg total) by mouth daily. 90 tablet 3  . fluticasone (FLONASE) 50 MCG/ACT nasal spray Place 2 sprays into both nostrils daily. 16 g 6  . folic acid (FOLVITE) 1 MG tablet Take 1 mg by mouth daily.      . metoCLOPramide (REGLAN) 10 MG tablet Take 1 tablet (10 mg total) by mouth at bedtime. 270 tablet 3  . Multiple Vitamin (MULTIVITAMIN) tablet Take 1 tablet by mouth daily.      Marland Kitchen nystatin (MYCOSTATIN) 100000 UNIT/ML suspension Take 10 mLs (1,000,000 Units total) by mouth 4 (four) times daily. 240 mL 6  . ranitidine  (ZANTAC) 150 MG tablet Take 150 mg by mouth 2 (two) times daily.    Marland Kitchen tiZANidine (ZANAFLEX) 2 MG tablet Take 1 tablet (2 mg total) by mouth 3 (three) times daily. 270 tablet 0   No current facility-administered medications on file prior to visit.   She is allergic to zofran; clarithromycin;  codeine; diazepam; sulfonamide derivatives; and prilosec.  Review of Systems  Review of Systems  Constitutional: Negative for activity change, appetite change and fatigue.  HENT: Negative for hearing loss, congestion, tinnitus and ear discharge.   Eyes: Negative for visual disturbance (see optho q1y -- vision corrected to 20/20 with glasses).  Respiratory: Negative for cough, chest tightness and shortness of breath.   Cardiovascular: Negative for chest pain, palpitations and leg swelling.  Gastrointestinal: Negative for abdominal pain, diarrhea, constipation and abdominal distention.  Genitourinary: Negative for urgency, frequency, decreased urine volume and difficulty urinating.  Musculoskeletal: Negative for back pain, arthralgias and gait problem.  Skin: Negative for color change, pallor and rash.  Neurological: Negative for dizziness, light-headedness, numbness and headaches.  Hematological: Negative for adenopathy. Does not bruise/bleed easily.  Psychiatric/Behavioral: Negative for suicidal ideas, confusion, sleep disturbance, self-injury, dysphoric mood, decreased concentration and agitation.  Pt is able to read and write and can do all ADLs No risk for falling No abuse/ violence in home      Objective:     Vision by Snellen chart: opth Body mass index is 14.54 kg/(m^2). BP 98/64 mmHg  Pulse 113  Temp(Src) 97.7 F (36.5 C) (Oral)  Ht 5' 3.5" (1.613 m)  Wt 83 lb 6.4 oz (37.83 kg)  BMI 14.54 kg/m2  SpO2 91%  BP 98/64 mmHg  Pulse 113  Temp(Src) 97.7 F (36.5 C) (Oral)  Ht 5' 3.5" (1.613 m)  Wt 83 lb 6.4 oz (37.83 kg)  BMI 14.54 kg/m2  SpO2 91% General appearance: alert,  cooperative, appears stated age, cachectic and no distress Head: Normocephalic, without obvious abnormality, atraumatic Eyes: conjunctivae/corneas clear. PERRL, EOM's intact. Fundi benign. Ears: normal TM's and external ear canals both ears Nose: Nares normal. Septum midline. Mucosa normal. No drainage or sinus tenderness. Throat: lips, mucosa, and tongue normal; teeth and gums normal Neck: no adenopathy, no carotid bruit, no JVD, supple, symmetrical, trachea midline and thyroid not enlarged, symmetric, no tenderness/mass/nodules Back: symmetric, no curvature. ROM normal. No CVA tenderness. Lungs: clear to auscultation bilaterally Breasts: normal appearance, no masses or tenderness Heart: regular rate and rhythm, S1, S2 normal, no murmur, click, rub or gallop Abdomen: soft, non-tender; bowel sounds normal; no masses,  no organomegaly Pelvic: not indicated; post-menopausal, no abnormal Pap smears in past Extremities: extremities normal, atraumatic, no cyanosis or edema Pulses: 2+ and symmetric Skin: Skin color, texture, turgor normal. No rashes or lesions Lymph nodes: Cervical, supraclavicular, and axillary nodes normal. Neurologic: Alert and oriented X 3, normal strength and tone. Normal symmetric reflexes. Normal coordination and gait Psych-- no depression, no anxiety      Assessment:     cpe      Plan:     During the course of the visit the patient was educated and counseled about appropriate screening and preventive services including:    Pneumococcal vaccine   Influenza vaccine  Screening mammography  Bone densitometry screening  Colorectal cancer screening  Diabetes screening  Glaucoma screening  Advanced directives: has an advanced directive - a copy HAS NOT been provided.  Diet review for nutrition referral? Yes ____  Not Indicated __x__   Patient Instructions (the written plan) was given to the patient.  Medicare Attestation I have personally  reviewed: The patient's medical and social history Their use of alcohol, tobacco or illicit drugs Their current medications and supplements The patient's functional ability including ADLs,fall risks, home safety risks, cognitive, and hearing and visual impairment Diet and physical activities Evidence for depression  or mood disorders  The patient's weight, height, BMI, and visual acuity have been recorded in the chart.  I have made referrals, counseling, and provided education to the patient based on review of the above and I have provided the patient with a written personalized care plan for preventive services.    1. Hearing loss, bilateral  - Ambulatory referral to Audiology  2. Anxiety  - ALPRAZolam (XANAX) 0.5 MG tablet; 1 po bid prn  Dispense: 180 tablet; Refill: 3  3. RLS (restless legs syndrome)  - rOPINIRole (REQUIP) 1 MG tablet; Take 1 tablet (1 mg total) by mouth at bedtime.  Dispense: 90 tablet; Refill: 3  4. Hyperlipidemia Check labs - Hepatic function panel - Lipid panel  5. Essential hypertension  - furosemide (LASIX) 40 MG tablet; Take 1 tablet (40 mg total) by mouth daily as needed.  Dispense: 90 tablet; Refill: 3 - potassium chloride SA (K-DUR,KLOR-CON) 20 MEQ tablet; Take 1 tablet (20 mEq total) by mouth 2 (two) times daily.  Dispense: 180 tablet; Refill: 3 - Basic metabolic panel - CBC with Differential - POCT urinalysis dipstick  6. Hypothyroidism, unspecified hypothyroidism type  - levothyroxine (SYNTHROID, LEVOTHROID) 25 MCG tablet; Take 1 tablet by mouth  daily  Dispense: 90 tablet; Refill: 3 - TSH  7. Nausea without vomiting  - promethazine (PHENERGAN) 25 MG tablet; Take 1 tablet by mouth two  times daily  Dispense: 180 tablet; Refill: 3  8. Osteoarthritis of multiple joints, unspecified osteoarthritis type  - traMADol (ULTRAM) 50 MG tablet; Take 1 tablet (50 mg total) by mouth every 6 (six) hours as needed.  Dispense: 270 tablet; Refill: 3  9.  Preventative health care  - Pneumococcal conjugate vaccine 13-valent IM  10. Need for vaccination with 13-polyvalent pneumococcal conjugate vaccine  - Pneumococcal conjugate vaccine 13-valent IM  Garnet Koyanagi, DO   08/11/2014

## 2014-08-11 NOTE — Progress Notes (Signed)
Pre visit review using our clinic review tool, if applicable. No additional management support is needed unless otherwise documented below in the visit note. 

## 2014-08-11 NOTE — Patient Instructions (Signed)
Preventive Care for Adults A healthy lifestyle and preventive care can promote health and wellness. Preventive health guidelines for women include the following key practices.  A routine yearly physical is a good way to check with your health care provider about your health and preventive screening. It is a chance to share any concerns and updates on your health and to receive a thorough exam.  Visit your dentist for a routine exam and preventive care every 6 months. Brush your teeth twice a day and floss once a day. Good oral hygiene prevents tooth decay and gum disease.  The frequency of eye exams is based on your age, health, family medical history, use of contact lenses, and other factors. Follow your health care provider's recommendations for frequency of eye exams.  Eat a healthy diet. Foods like vegetables, fruits, whole grains, low-fat dairy products, and lean protein foods contain the nutrients you need without too many calories. Decrease your intake of foods high in solid fats, added sugars, and salt. Eat the right amount of calories for you.Get information about a proper diet from your health care provider, if necessary.  Regular physical exercise is one of the most important things you can do for your health. Most adults should get at least 150 minutes of moderate-intensity exercise (any activity that increases your heart rate and causes you to sweat) each week. In addition, most adults need muscle-strengthening exercises on 2 or more days a week.  Maintain a healthy weight. The body mass index (BMI) is a screening tool to identify possible weight problems. It provides an estimate of body fat based on height and weight. Your health care provider can find your BMI and can help you achieve or maintain a healthy weight.For adults 20 years and older:  A BMI below 18.5 is considered underweight.  A BMI of 18.5 to 24.9 is normal.  A BMI of 25 to 29.9 is considered overweight.  A BMI of  30 and above is considered obese.  Maintain normal blood lipids and cholesterol levels by exercising and minimizing your intake of saturated fat. Eat a balanced diet with plenty of fruit and vegetables. Blood tests for lipids and cholesterol should begin at age 33 and be repeated every 5 years. If your lipid or cholesterol levels are high, you are over 50, or you are at high risk for heart disease, you may need your cholesterol levels checked more frequently.Ongoing high lipid and cholesterol levels should be treated with medicines if diet and exercise are not working.  If you smoke, find out from your health care provider how to quit. If you do not use tobacco, do not start.  Lung cancer screening is recommended for adults aged 54-80 years who are at high risk for developing lung cancer because of a history of smoking. A yearly low-dose CT scan of the lungs is recommended for people who have at least a 30-pack-year history of smoking and are a current smoker or have quit within the past 15 years. A pack year of smoking is smoking an average of 1 pack of cigarettes a day for 1 year (for example: 1 pack a day for 30 years or 2 packs a day for 15 years). Yearly screening should continue until the smoker has stopped smoking for at least 15 years. Yearly screening should be stopped for people who develop a health problem that would prevent them from having lung cancer treatment.  If you are pregnant, do not drink alcohol. If you are breastfeeding,  be very cautious about drinking alcohol. If you are not pregnant and choose to drink alcohol, do not have more than 1 drink per day. One drink is considered to be 12 ounces (355 mL) of beer, 5 ounces (148 mL) of wine, or 1.5 ounces (44 mL) of liquor.  Avoid use of street drugs. Do not share needles with anyone. Ask for help if you need support or instructions about stopping the use of drugs.  High blood pressure causes heart disease and increases the risk of  stroke. Your blood pressure should be checked at least every 1 to 2 years. Ongoing high blood pressure should be treated with medicines if weight loss and exercise do not work.  If you are 75-52 years old, ask your health care provider if you should take aspirin to prevent strokes.  Diabetes screening involves taking a blood sample to check your fasting blood sugar level. This should be done once every 3 years, after age 15, if you are within normal weight and without risk factors for diabetes. Testing should be considered at a younger age or be carried out more frequently if you are overweight and have at least 1 risk factor for diabetes.  Breast cancer screening is essential preventive care for women. You should practice "breast self-awareness." This means understanding the normal appearance and feel of your breasts and may include breast self-examination. Any changes detected, no matter how small, should be reported to a health care provider. Women in their 58s and 30s should have a clinical breast exam (CBE) by a health care provider as part of a regular health exam every 1 to 3 years. After age 16, women should have a CBE every year. Starting at age 53, women should consider having a mammogram (breast X-ray test) every year. Women who have a family history of breast cancer should talk to their health care provider about genetic screening. Women at a high risk of breast cancer should talk to their health care providers about having an MRI and a mammogram every year.  Breast cancer gene (BRCA)-related cancer risk assessment is recommended for women who have family members with BRCA-related cancers. BRCA-related cancers include breast, ovarian, tubal, and peritoneal cancers. Having family members with these cancers may be associated with an increased risk for harmful changes (mutations) in the breast cancer genes BRCA1 and BRCA2. Results of the assessment will determine the need for genetic counseling and  BRCA1 and BRCA2 testing.  Routine pelvic exams to screen for cancer are no longer recommended for nonpregnant women who are considered low risk for cancer of the pelvic organs (ovaries, uterus, and vagina) and who do not have symptoms. Ask your health care provider if a screening pelvic exam is right for you.  If you have had past treatment for cervical cancer or a condition that could lead to cancer, you need Pap tests and screening for cancer for at least 20 years after your treatment. If Pap tests have been discontinued, your risk factors (such as having a new sexual partner) need to be reassessed to determine if screening should be resumed. Some women have medical problems that increase the chance of getting cervical cancer. In these cases, your health care provider may recommend more frequent screening and Pap tests.  The HPV test is an additional test that may be used for cervical cancer screening. The HPV test looks for the virus that can cause the cell changes on the cervix. The cells collected during the Pap test can be  tested for HPV. The HPV test could be used to screen women aged 70 years and older, and should be used in women of any age who have unclear Pap test results. After the age of 73, women should have HPV testing at the same frequency as a Pap test.  Colorectal cancer can be detected and often prevented. Most routine colorectal cancer screening begins at the age of 79 years and continues through age 66 years. However, your health care provider may recommend screening at an earlier age if you have risk factors for colon cancer. On a yearly basis, your health care provider may provide home test kits to check for hidden blood in the stool. Use of a small camera at the end of a tube, to directly examine the colon (sigmoidoscopy or colonoscopy), can detect the earliest forms of colorectal cancer. Talk to your health care provider about this at age 17, when routine screening begins. Direct  exam of the colon should be repeated every 5-10 years through age 30 years, unless early forms of pre-cancerous polyps or small growths are found.  People who are at an increased risk for hepatitis B should be screened for this virus. You are considered at high risk for hepatitis B if:  You were born in a country where hepatitis B occurs often. Talk with your health care provider about which countries are considered high risk.  Your parents were born in a high-risk country and you have not received a shot to protect against hepatitis B (hepatitis B vaccine).  You have HIV or AIDS.  You use needles to inject street drugs.  You live with, or have sex with, someone who has hepatitis B.  You get hemodialysis treatment.  You take certain medicines for conditions like cancer, organ transplantation, and autoimmune conditions.  Hepatitis C blood testing is recommended for all people born from [birth year] through 1965 and any individual with known risks for hepatitis C.  Practice safe sex. Use condoms and avoid high-risk sexual practices to reduce the spread of sexually transmitted infections (STIs). STIs include gonorrhea, chlamydia, syphilis, trichomonas, herpes, HPV, and human immunodeficiency virus (HIV). Herpes, HIV, and HPV are viral illnesses that have no cure. They can result in disability, cancer, and death.  You should be screened for sexually transmitted illnesses (STIs) including gonorrhea and chlamydia if:  You are sexually active and are younger than 24 years.  You are older than 24 years and your health care provider tells you that you are at risk for this type of infection.  Your sexual activity has changed since you were last screened and you are at an increased risk for chlamydia or gonorrhea. Ask your health care provider if you are at risk.  If you are at risk of being infected with HIV, it is recommended that you take a prescription medicine daily to prevent HIV infection. This is  called preexposure prophylaxis (PrEP). You are considered at risk if:  You are a heterosexual woman, are sexually active, and are at increased risk for HIV infection.  You take drugs by injection.  You are sexually active with a partner who has HIV.  Talk with your health care provider about whether you are at high risk of being infected with HIV. If you choose to begin PrEP, you should first be tested for HIV. You should then be tested every 3 months for as long as you are taking PrEP.  Osteoporosis is a disease in which the bones lose minerals and strength  with aging. This can result in serious bone fractures or breaks. The risk of osteoporosis can be identified using a bone density scan. Women ages 65 years and over and women at risk for fractures or osteoporosis should discuss screening with their health care providers. Ask your health care provider whether you should take a calcium supplement or vitamin D to reduce the rate of osteoporosis.  Menopause can be associated with physical symptoms and risks. Hormone replacement therapy is available to decrease symptoms and risks. You should talk to your health care provider about whether hormone replacement therapy is right for you.  Use sunscreen. Apply sunscreen liberally and repeatedly throughout the day. You should seek shade when your shadow is shorter than you. Protect yourself by wearing long sleeves, pants, a wide-brimmed hat, and sunglasses year round, whenever you are outdoors.  Once a month, do a whole body skin exam, using a mirror to look at the skin on your back. Tell your health care provider of new moles, moles that have irregular borders, moles that are larger than a pencil eraser, or moles that have changed in shape or color.  Stay current with required vaccines (immunizations).  Influenza vaccine. All adults should be immunized every year.  Tetanus, diphtheria, and acellular pertussis (Td, Tdap) vaccine. Pregnant women should  receive 1 dose of Tdap vaccine during each pregnancy. The dose should be obtained regardless of the length of time since the last dose. Immunization is preferred during the 27th-36th week of gestation. An adult who has not previously received Tdap or who does not know her vaccine status should receive 1 dose of Tdap. This initial dose should be followed by tetanus and diphtheria toxoids (Td) booster doses every 10 years. Adults with an unknown or incomplete history of completing a 3-dose immunization series with Td-containing vaccines should begin or complete a primary immunization series including a Tdap dose. Adults should receive a Td booster every 10 years.  Varicella vaccine. An adult without evidence of immunity to varicella should receive 2 doses or a second dose if she has previously received 1 dose. Pregnant females who do not have evidence of immunity should receive the first dose after pregnancy. This first dose should be obtained before leaving the health care facility. The second dose should be obtained 4-8 weeks after the first dose.  Human papillomavirus (HPV) vaccine. Females aged 13-26 years who have not received the vaccine previously should obtain the 3-dose series. The vaccine is not recommended for use in pregnant females. However, pregnancy testing is not needed before receiving a dose. If a female is found to be pregnant after receiving a dose, no treatment is needed. In that case, the remaining doses should be delayed until after the pregnancy. Immunization is recommended for any person with an immunocompromised condition through the age of 26 years if she did not get any or all doses earlier. During the 3-dose series, the second dose should be obtained 4-8 weeks after the first dose. The third dose should be obtained 24 weeks after the first dose and 16 weeks after the second dose.  Zoster vaccine. One dose is recommended for adults aged 60 years or older unless certain conditions are  present.  Measles, mumps, and rubella (MMR) vaccine. Adults born before 1957 generally are considered immune to measles and mumps. Adults born in 1957 or later should have 1 or more doses of MMR vaccine unless there is a contraindication to the vaccine or there is laboratory evidence of immunity to   each of the three diseases. A routine second dose of MMR vaccine should be obtained at least 28 days after the first dose for students attending postsecondary schools, health care workers, or international travelers. People who received inactivated measles vaccine or an unknown type of measles vaccine during 1963-1967 should receive 2 doses of MMR vaccine. People who received inactivated mumps vaccine or an unknown type of mumps vaccine before 1979 and are at high risk for mumps infection should consider immunization with 2 doses of MMR vaccine. For females of childbearing age, rubella immunity should be determined. If there is no evidence of immunity, females who are not pregnant should be vaccinated. If there is no evidence of immunity, females who are pregnant should delay immunization until after pregnancy. Unvaccinated health care workers born before 1957 who lack laboratory evidence of measles, mumps, or rubella immunity or laboratory confirmation of disease should consider measles and mumps immunization with 2 doses of MMR vaccine or rubella immunization with 1 dose of MMR vaccine.  Pneumococcal 13-valent conjugate (PCV13) vaccine. When indicated, a person who is uncertain of her immunization history and has no record of immunization should receive the PCV13 vaccine. An adult aged 19 years or older who has certain medical conditions and has not been previously immunized should receive 1 dose of PCV13 vaccine. This PCV13 should be followed with a dose of pneumococcal polysaccharide (PPSV23) vaccine. The PPSV23 vaccine dose should be obtained at least 8 weeks after the dose of PCV13 vaccine. An adult aged 19  years or older who has certain medical conditions and previously received 1 or more doses of PPSV23 vaccine should receive 1 dose of PCV13. The PCV13 vaccine dose should be obtained 1 or more years after the last PPSV23 vaccine dose.  Pneumococcal polysaccharide (PPSV23) vaccine. When PCV13 is also indicated, PCV13 should be obtained first. All adults aged 65 years and older should be immunized. An adult younger than age 65 years who has certain medical conditions should be immunized. Any person who resides in a nursing home or long-term care facility should be immunized. An adult smoker should be immunized. People with an immunocompromised condition and certain other conditions should receive both PCV13 and PPSV23 vaccines. People with human immunodeficiency virus (HIV) infection should be immunized as soon as possible after diagnosis. Immunization during chemotherapy or radiation therapy should be avoided. Routine use of PPSV23 vaccine is not recommended for American Indians, [place] Natives, or people younger than 65 years unless there are medical conditions that require PPSV23 vaccine. When indicated, people who have unknown immunization and have no record of immunization should receive PPSV23 vaccine. One-time revaccination 5 years after the first dose of PPSV23 is recommended for people aged 19-64 years who have chronic kidney failure, nephrotic syndrome, asplenia, or immunocompromised conditions. People who received 1-2 doses of PPSV23 before age 65 years should receive another dose of PPSV23 vaccine at age 65 years or later if at least 5 years have passed since the previous dose. Doses of PPSV23 are not needed for people immunized with PPSV23 at or after age 65 years.  Meningococcal vaccine. Adults with asplenia or persistent complement component deficiencies should receive 2 doses of quadrivalent meningococcal conjugate (MenACWY-D) vaccine. The doses should be obtained at least 2 months apart.  Microbiologists working with certain meningococcal bacteria, military recruits, people at risk during an outbreak, and people who travel to or live in countries with a high rate of meningitis should be immunized. A first-year college student up through age   21 years who is living in a residence hall should receive a dose if she did not receive a dose on or after her 16th birthday. Adults who have certain high-risk conditions should receive one or more doses of vaccine.  Hepatitis A vaccine. Adults who wish to be protected from this disease, have certain high-risk conditions, work with hepatitis A-infected animals, work in hepatitis A research labs, or travel to or work in countries with a high rate of hepatitis A should be immunized. Adults who were previously unvaccinated and who anticipate close contact with an international adoptee during the first 60 days after arrival in the [place] from a country with a high rate of hepatitis A should be immunized.  Hepatitis B vaccine. Adults who wish to be protected from this disease, have certain high-risk conditions, may be exposed to blood or other infectious body fluids, are household contacts or sex partners of hepatitis B positive people, are clients or workers in certain care facilities, or travel to or work in countries with a high rate of hepatitis B should be immunized.  Haemophilus influenzae type b (Hib) vaccine. A previously unvaccinated person with asplenia or sickle cell disease or having a scheduled splenectomy should receive 1 dose of Hib vaccine. Regardless of previous immunization, a recipient of a hematopoietic stem cell transplant should receive a 3-dose series 6-12 months after her successful transplant. Hib vaccine is not recommended for adults with HIV infection. Preventive Services / Frequency Ages 69 to 80 years  Blood pressure check.** / Every 1 to 2 years.  Lipid and cholesterol check.** / Every 5 years beginning at age  71.  Clinical breast exam.** / Every 3 years for women in their 69s and 30s.  BRCA-related cancer risk assessment.** / For women who have family members with a BRCA-related cancer (breast, ovarian, tubal, or peritoneal cancers).  Pap test.** / Every 2 years from ages 34 through 69. Every 3 years starting at age 45 through age 43 or 46 with a history of 3 consecutive normal Pap tests.  HPV screening.** / Every 3 years from ages 35 through ages 91 to 57 with a history of 3 consecutive normal Pap tests.  Hepatitis C blood test.** / For any individual with known risks for hepatitis C.  Skin self-exam. / Monthly.  Influenza vaccine. / Every year.  Tetanus, diphtheria, and acellular pertussis (Tdap, Td) vaccine.** / Consult your health care provider. Pregnant women should receive 1 dose of Tdap vaccine during each pregnancy. 1 dose of Td every 10 years.  Varicella vaccine.** / Consult your health care provider. Pregnant females who do not have evidence of immunity should receive the first dose after pregnancy.  HPV vaccine. / 3 doses over 6 months, if 27 and younger. The vaccine is not recommended for use in pregnant females. However, pregnancy testing is not needed before receiving a dose.  Measles, mumps, rubella (MMR) vaccine.** / You need at least 1 dose of MMR if you were born in 1957 or later. You may also need a 2nd dose. For females of childbearing age, rubella immunity should be determined. If there is no evidence of immunity, females who are not pregnant should be vaccinated. If there is no evidence of immunity, females who are pregnant should delay immunization until after pregnancy.  Pneumococcal 13-valent conjugate (PCV13) vaccine.** / Consult your health care provider.  Pneumococcal polysaccharide (PPSV23) vaccine.** / 1 to 2 doses if you smoke cigarettes or if you have certain conditions.  Meningococcal vaccine.** /  1 dose if you are age 19 to 21 years and a first-year college  student living in a residence hall, or have one of several medical conditions, you need to get vaccinated against meningococcal disease. You may also need additional booster doses.  Hepatitis A vaccine.** / Consult your health care provider.  Hepatitis B vaccine.** / Consult your health care provider.  Haemophilus influenzae type b (Hib) vaccine.** / Consult your health care provider. Ages 40 to 64 years  Blood pressure check.** / Every 1 to 2 years.  Lipid and cholesterol check.** / Every 5 years beginning at age 20 years.  Lung cancer screening. / Every year if you are aged 55-80 years and have a 30-pack-year history of smoking and currently smoke or have quit within the past 15 years. Yearly screening is stopped once you have quit smoking for at least 15 years or develop a health problem that would prevent you from having lung cancer treatment.  Clinical breast exam.** / Every year after age 40 years.  BRCA-related cancer risk assessment.** / For women who have family members with a BRCA-related cancer (breast, ovarian, tubal, or peritoneal cancers).  Mammogram.** / Every year beginning at age 40 years and continuing for as long as you are in good health. Consult with your health care provider.  Pap test.** / Every 3 years starting at age 30 years through age 65 or 70 years with a history of 3 consecutive normal Pap tests.  HPV screening.** / Every 3 years from ages 30 years through ages 65 to 70 years with a history of 3 consecutive normal Pap tests.  Fecal occult blood test (FOBT) of stool. / Every year beginning at age 50 years and continuing until age 75 years. You may not need to do this test if you get a colonoscopy every 10 years.  Flexible sigmoidoscopy or colonoscopy.** / Every 5 years for a flexible sigmoidoscopy or every 10 years for a colonoscopy beginning at age 50 years and continuing until age 75 years.  Hepatitis C blood test.** / For all people born from 1945 through  1965 and any individual with known risks for hepatitis C.  Skin self-exam. / Monthly.  Influenza vaccine. / Every year.  Tetanus, diphtheria, and acellular pertussis (Tdap/Td) vaccine.** / Consult your health care provider. Pregnant women should receive 1 dose of Tdap vaccine during each pregnancy. 1 dose of Td every 10 years.  Varicella vaccine.** / Consult your health care provider. Pregnant females who do not have evidence of immunity should receive the first dose after pregnancy.  Zoster vaccine.** / 1 dose for adults aged 60 years or older.  Measles, mumps, rubella (MMR) vaccine.** / You need at least 1 dose of MMR if you were born in 1957 or later. You may also need a 2nd dose. For females of childbearing age, rubella immunity should be determined. If there is no evidence of immunity, females who are not pregnant should be vaccinated. If there is no evidence of immunity, females who are pregnant should delay immunization until after pregnancy.  Pneumococcal 13-valent conjugate (PCV13) vaccine.** / Consult your health care provider.  Pneumococcal polysaccharide (PPSV23) vaccine.** / 1 to 2 doses if you smoke cigarettes or if you have certain conditions.  Meningococcal vaccine.** / Consult your health care provider.  Hepatitis A vaccine.** / Consult your health care provider.  Hepatitis B vaccine.** / Consult your health care provider.  Haemophilus influenzae type b (Hib) vaccine.** / Consult your health care provider. Ages 65   years and over  Blood pressure check.** / Every 1 to 2 years.  Lipid and cholesterol check.** / Every 5 years beginning at age 22 years.  Lung cancer screening. / Every year if you are aged 73-80 years and have a 30-pack-year history of smoking and currently smoke or have quit within the past 15 years. Yearly screening is stopped once you have quit smoking for at least 15 years or develop a health problem that would prevent you from having lung cancer  treatment.  Clinical breast exam.** / Every year after age 4 years.  BRCA-related cancer risk assessment.** / For women who have family members with a BRCA-related cancer (breast, ovarian, tubal, or peritoneal cancers).  Mammogram.** / Every year beginning at age 40 years and continuing for as long as you are in good health. Consult with your health care provider.  Pap test.** / Every 3 years starting at age 9 years through age 34 or 91 years with 3 consecutive normal Pap tests. Testing can be stopped between 65 and 70 years with 3 consecutive normal Pap tests and no abnormal Pap or HPV tests in the past 10 years.  HPV screening.** / Every 3 years from ages 57 years through ages 64 or 45 years with a history of 3 consecutive normal Pap tests. Testing can be stopped between 65 and 70 years with 3 consecutive normal Pap tests and no abnormal Pap or HPV tests in the past 10 years.  Fecal occult blood test (FOBT) of stool. / Every year beginning at age 15 years and continuing until age 17 years. You may not need to do this test if you get a colonoscopy every 10 years.  Flexible sigmoidoscopy or colonoscopy.** / Every 5 years for a flexible sigmoidoscopy or every 10 years for a colonoscopy beginning at age 86 years and continuing until age 71 years.  Hepatitis C blood test.** / For all people born from [birth year] through 1963 and any individual with known risks for hepatitis C.  Osteoporosis screening.** / A one-time screening for women ages 83 years and over and women at risk for fractures or osteoporosis.  Skin self-exam. / Monthly.  Influenza vaccine. / Every year.  Tetanus, diphtheria, and acellular pertussis (Tdap/Td) vaccine.** / 1 dose of Td every 10 years.  Varicella vaccine.** / Consult your health care provider.  Zoster vaccine.** / 1 dose for adults aged 61 years or older.  Pneumococcal 13-valent conjugate (PCV13) vaccine.** / Consult your health care provider.  Pneumococcal  polysaccharide (PPSV23) vaccine.** / 1 dose for all adults aged 28 years and older.  Meningococcal vaccine.** / Consult your health care provider.  Hepatitis A vaccine.** / Consult your health care provider.  Hepatitis B vaccine.** / Consult your health care provider.  Haemophilus influenzae type b (Hib) vaccine.** / Consult your health care provider. ** Family history and personal history of risk and conditions may change your health care provider's recommendations. Document Released: 10/07/2001 Document Revised: 12/26/2013 Document Reviewed: 01/06/2011 [facility] Patient Information 2015 [place], [place]. This information is not intended to replace advice given to you by your health care provider. Make sure you discuss any questions you have with your health care provider.

## 2014-08-15 ENCOUNTER — Encounter: Payer: Self-pay | Admitting: Family Medicine

## 2014-08-15 DIAGNOSIS — E876 Hypokalemia: Secondary | ICD-10-CM

## 2014-08-15 LAB — TSH: TSH: 2.805 u[IU]/mL (ref 0.350–4.500)

## 2014-08-16 NOTE — Telephone Encounter (Signed)
Please enter BMP to be done at [facility] (per pt request). Due to low K+- please also get a Magnesium level (dx hypokalemia)rder.     Orders are in.       KP

## 2014-08-22 ENCOUNTER — Encounter: Payer: Self-pay | Admitting: Family Medicine

## 2014-08-28 ENCOUNTER — Encounter: Payer: Self-pay | Admitting: Family Medicine

## 2014-08-29 ENCOUNTER — Ambulatory Visit
Admission: RE | Admit: 2014-08-29 | Discharge: 2014-08-29 | Disposition: A | Discharge status: Home / Self Care | Payer: MEDICARE | Source: Ambulatory Visit | Attending: Urology | Admitting: Urology

## 2014-08-29 ENCOUNTER — Other Ambulatory Visit: Payer: Self-pay | Admitting: Family Medicine

## 2014-08-29 ENCOUNTER — Encounter: Payer: Self-pay | Admitting: Family Medicine

## 2014-08-29 DIAGNOSIS — N2889 Other specified disorders of kidney and ureter: Secondary | ICD-10-CM

## 2014-08-29 HISTORY — DX: Hypothyroidism, unspecified: E03.9

## 2014-08-29 HISTORY — PX: IR GENERIC HISTORICAL: IMG1180011

## 2014-08-29 NOTE — Consult Note (Signed)
  Chief Complaint: Chief Complaint  Patient presents with  . Advice Only  Enlarging solid right renal mass.  Referring Physician(s): Ottelin,Mark C  History of Present Illness: [name] is an 81 y.o. female with a history of an incidentally detected enhancing right renal mass by CT.  Initial detection was by CT on 10/19/2012 demonstrating a 17 mm lateral cortical mass of the mid right kidney that enlarged over additional CT studies on 04/10/2014 and 07/14/2014.  Based on measurements I made today on axial CT images, the lesion measured approximately 1.3 x 1.8 cm on 10/19/2012, 1.5 x 1.9 cm on 04/10/2014 and 1.6 x 2.2 cm on 07/14/2014. The lesion is predominantly solid with suggestion of some small central and peripheral cystic areas by CT. The lesion shows contrast enhancement and most likely represents a malignant tumor. There is no evidence by CT of renal vein involvement or metastatic spread in the abdomen or pelvis. The patient is asymptomatic with respect to the mass.  Past Medical History  Diagnosis Date  . Anxiety   . Depression   . Hyperlipidemia   . Anemia   . Cataract 05/2011  . Thyroid disease     hypothyroidism  . Allergy   . GERD (gastroesophageal reflux disease)   . Myocardial infarction   . Osteoporosis   . Renal cell carcinoma     found on CT  . Esophageal yeast infection   . Esophageal disorder   . Hypothyroidism     Past Surgical History  Procedure Laterality Date  . Coronary artery bypass graft    . Obstructed ureter      operative repair  . Abdominal hysterectomy    . Total abdominal hysterectomy w/ bilateral salpingoophorectomy    . Kyphosis surgery    . Basal cell carcinoma excision    . Eye surgery  05/2011  . Tonsillectomy    . Appendectomy    . Colonoscopy      Allergies: Zofran; Clarithromycin; Codeine; Diazepam; Sulfonamide derivatives; and Prilosec  Medications: Prior to Admission medications   Medication Sig Start Date End Date  Taking? Authorizing Provider  ALPRAZolam (XANAX) 0.5 MG tablet 1 po bid prn 08/11/14  Yes Yvonne R Lowne, DO  aspirin 81 MG EC tablet Take 81 mg by mouth daily.     Yes Historical Provider, MD  fluconazole (DIFLUCAN) 100 MG tablet Take 1 tablet (100 mg total) by mouth daily. 03/24/14  Yes Daniel P Jacobs, MD  furosemide (LASIX) 40 MG tablet Take 1 tablet (40 mg total) by mouth daily as needed. 08/11/14  Yes Yvonne R Lowne, DO  levothyroxine (SYNTHROID, LEVOTHROID) 25 MCG tablet Take 1 tablet by mouth  daily 08/11/14  Yes Yvonne R Lowne, DO  metoCLOPramide (REGLAN) 10 MG tablet Take 1 tablet (10 mg total) by mouth at bedtime. 11/23/12  Yes Daniel P Jacobs, MD  nystatin (MYCOSTATIN) 100000 UNIT/ML suspension Take 10 mLs (1,000,000 Units total) by mouth 4 (four) times daily. 03/24/14  Yes Daniel P Jacobs, MD  potassium chloride SA (K-DUR,KLOR-CON) 20 MEQ tablet Take 1 tablet (20 mEq total) by mouth 2 (two) times daily. 08/11/14  Yes Yvonne R Lowne, DO  promethazine (PHENERGAN) 25 MG tablet Take 1 tablet by mouth two  times daily 08/11/14  Yes Yvonne R Lowne, DO  ranitidine (ZANTAC) 150 MG tablet Take 150 mg by mouth 2 (two) times daily.   Yes Historical Provider, MD  rOPINIRole (REQUIP) 1 MG tablet Take 1 tablet (1 mg total) by mouth   at bedtime. 08/11/14  Yes Yvonne R Lowne, DO  tiZANidine (ZANAFLEX) 2 MG tablet Take 1 tablet (2 mg total) by mouth 3 (three) times daily. 05/31/12  Yes Yvonne R Lowne, DO  traMADol (ULTRAM) 50 MG tablet Take 1 tablet (50 mg total) by mouth every 6 (six) hours as needed. 08/11/14  Yes Yvonne R Lowne, DO  Cholecalciferol (VITAMIN D3) 1000 UNITS CAPS Take by mouth daily.      Historical Provider, MD  ferrous fumarate (FERRETTS) 325 (106 FE) MG TABS Take by mouth.      Historical Provider, MD  fluticasone (FLONASE) 50 MCG/ACT nasal spray Place 2 sprays into both nostrils daily. 09/07/13   Yvonne R Lowne, DO  folic acid (FOLVITE) 1 MG tablet Take 1 mg by mouth daily.      Historical  Provider, MD  Multiple Vitamin (MULTIVITAMIN) tablet Take 1 tablet by mouth daily.      Historical Provider, MD    Family History  Problem Relation Age of Onset  . Parkinsonism Father   . Cancer Other     breast 1st degree relative  . Hypertension Other   . Heart disease Other     CAD..1ST DEGREE RELATIVE <60  . Hyperlipidemia Other   . Colon cancer Neg Hx   . Esophageal cancer Neg Hx   . Rectal cancer Neg Hx   . Stomach cancer Neg Hx     History   Social History  . Marital Status: Single    Spouse Name: N/A    Number of Children: 2  . Years of Education: N/A   Occupational History  . Retired    Social History Main Topics  . Smoking status: Former Smoker  . Smokeless tobacco: Never Used  . Alcohol Use: No  . Drug Use: No  . Sexual Activity: Not Currently   Other Topics Concern  . Not on file   Social History Narrative   REG EXERCISE   [employer]   LIVES ALONE   END OF LIFE:PATIENT HAS LIVING WILL AND CLEARLY STATES SHE DOES NOT WANT CARDIAC RESUSCITATION,MECHANICLA VENTILATION OR OTHER HEROIC OR FUTILE MEASURES.    ECOG Status: 0 - Asymptomatic  Review of Systems: A 12 point ROS discussed and pertinent positives are indicated in the HPI above.  All other systems are negative.  Review of Systems  Constitutional: Negative.   Respiratory: Negative.   Cardiovascular: Negative.   Gastrointestinal: Negative.   Genitourinary: Negative.   Neurological: Negative.   The patient has had prior weight loss from starting a low fat diet due to chronic candidiasis.  Her weight has been stable at 88 to 92 lbs.  Vital Signs: BP 118/56 mmHg  Pulse 79  Temp(Src) 98 F (36.7 C) (Oral)  Resp 14  Ht 5' 3.5" (1.613 m)  Wt 90 lb (40.824 kg)  BMI 15.69 kg/m2  SpO2 98%  Physical Exam  Constitutional: She is oriented to person, place, and time. No distress.  Cardiovascular: Normal rate and regular rhythm.  Exam reveals no gallop and no friction rub.   No murmur  heard. Pulmonary/Chest: Effort normal and breath sounds normal. No respiratory distress. She has no wheezes. She has no rales.  Abdominal: Soft. Bowel sounds are normal. She exhibits no distension. There is no tenderness.  Neurological: She is alert and oriented to person, place, and time.  Skin: Skin is warm and dry. She is not diaphoretic.    Imaging: No results found.  Labs:  CBC:  Recent Labs    08/11/14 0925  WBC 8.8  HGB 14.9  HCT 45.5  PLT 317.0    COAGS: No results for input(s): INR, APTT in the last 8760 hours.  BMP:  Recent Labs  10/06/13 0746 10/27/13 0733 04/05/14 0729 08/11/14 0925  NA 140 138  --  134*  K 4.1 4.0  --  3.1*  CL 105 106  --  97  CO2 26 25  --  24  GLUCOSE 130* 78  --  92  BUN _0 CALCIUM 9.3 9.0  --  9.0  CREATININE 1.2 1.0 0.8 1.7*    LIVER FUNCTION TESTS:  Recent Labs  10/06/13 0746 08/11/14 0925  BILITOT 0.8 0.5  AST 31 30  ALT 26 28  ALKPHOS 55 62  PROT 5.8* 7.0  ALBUMIN 3.1* 3.9    TUMOR MARKERS: No results for input(s): AFPTM, CEA, CA199, CHROMGRNA in the last 8760 hours.  Assessment and Plan:  I met with [name] and her daughter, [name].  We discussed treatment options for the enlarging right renal mass including continued observation, partial nephrectomy and percutaneous ablation. Renal function had been normal in the past with recent elevated creatinine of 1.7 on 08/11/2014. Dr. Etter Sjogren recommended follow-up labs in 2-3 weeks. We will coordinate with Dr. Nonda Lou office in making sure that the renal function is rechecked this month.  Assuming that this was a transient bump in renal function, the patient is a good candidate for percutaneous ablation. The lesion is of size and location amenable to percutaneous cryoablation. Dr. Karsten Ro has indicated that due to her age and comorbidities, she would not be an optimal candidate for surgical resection. A maximal nephron sparing procedure is indicated. Given  enhancement pattern as well as enlarging nature of this mass, this is statistically most likely a malignant neoplasm of the kidney.  Details of percutaneous ablation were discussed with the patient, including risks. The procedure is performed under general anesthesia. Last general anesthesia was for a reconstructive surgery of the right eyelid in 2009. The patient tolerated anesthesia well. The patient would be admitted for overnight observation after ablation. Biopsy of the mass is not necessary prior to ablation given its strong likelihood of malignancy. A biopsy procedure can be performed at the time of ablation to establish a tissue diagnosis.  After discussion with the patient and her daughter, the patient would like to proceed with scheduling percutaneous cryoablation. Before performing the procedure, I have recommended that she obtain formal cardiac clearance from Dr. Burt Knack given history of prior myocardial infarction and CABG in 2004. She recently was evaluated by him and states that she was told that she is doing well from a cardiac standpoint.   We will also reobtain a basic metabolic profile to make sure that renal function is returning to normal prior to proceeding with ablation. Successful ablation should have minimal impact on overall renal function. If the renal function returns to normal, I would predict that we should be able to treat [name] in February.  Thank you for this interesting consult.  I greatly enjoyed meeting [name] and look forward to participating in their care.  I spent a total of 40 minutes face to face in clinical consultation, greater than 50% of which was counseling/coordinating care for a right renal tumor.  SignedAletta Edouard T 08/29/2014, 11:19 AM   Venetia Night. Kathlene Cote, M.D. Pager:  [phone]

## 2014-08-30 ENCOUNTER — Telehealth: Payer: Self-pay | Admitting: Cardiovascular Disease

## 2014-08-30 NOTE — Telephone Encounter (Signed)
Spoke w/Tammy at [facility] who states pt is scheduled for (R) renal cryo ablation under general on 2/12. Advised Dr. Burt Knack nor his nurse Ander Purpura is in office but will forward to them.  She states can leave note in epic or fax to [phone].

## 2014-08-30 NOTE — Telephone Encounter (Signed)
New Prob    Requesting cardiac clearance for R renal cryo ablation under general anesthesia. Please call.

## 2014-09-02 NOTE — Telephone Encounter (Signed)
Done. See Epic note - addendum to last OV.

## 2014-09-04 NOTE — Telephone Encounter (Signed)
Notified Tammy at [facility] that Dr. Burt Knack made an Addendum to last OV.

## 2014-09-11 ENCOUNTER — Other Ambulatory Visit: Payer: Self-pay | Admitting: Interventional Radiology

## 2014-09-11 DIAGNOSIS — N2889 Other specified disorders of kidney and ureter: Secondary | ICD-10-CM

## 2014-09-22 ENCOUNTER — Other Ambulatory Visit (INDEPENDENT_AMBULATORY_CARE_PROVIDER_SITE_OTHER): Payer: MEDICARE

## 2014-09-22 DIAGNOSIS — H26493 Other secondary cataract, bilateral: Secondary | ICD-10-CM | POA: Diagnosis not present

## 2014-09-22 DIAGNOSIS — E876 Hypokalemia: Secondary | ICD-10-CM | POA: Diagnosis not present

## 2014-09-22 DIAGNOSIS — H35363 Drusen (degenerative) of macula, bilateral: Secondary | ICD-10-CM | POA: Diagnosis not present

## 2014-09-22 DIAGNOSIS — H40013 Open angle with borderline findings, low risk, bilateral: Secondary | ICD-10-CM | POA: Diagnosis not present

## 2014-09-22 DIAGNOSIS — Z961 Presence of intraocular lens: Secondary | ICD-10-CM | POA: Diagnosis not present

## 2014-09-22 LAB — BASIC METABOLIC PANEL
BUN: 20 mg/dL (ref 6–23)
CO2: 24 meq/L (ref 19–32)
Calcium: 9.1 mg/dL (ref 8.4–10.5)
Chloride: 106 mEq/L (ref 96–112)
Creatinine, Ser: 0.96 mg/dL (ref 0.40–1.20)
GFR: 58.79 mL/min — AB (ref >60.00)
GLUCOSE: 98 mg/dL (ref 70–99)
Potassium: 3.9 mEq/L (ref 3.5–5.1)
SODIUM: 137 meq/L (ref 135–145)

## 2014-09-22 LAB — MAGNESIUM: MAGNESIUM: 2 mg/dL (ref 1.5–2.5)

## 2014-09-25 HISTORY — PX: RENAL CRYOABLATION: SHX2322

## 2014-09-26 ENCOUNTER — Other Ambulatory Visit: Payer: Self-pay | Admitting: Radiology

## 2014-09-26 NOTE — Progress Notes (Signed)
Called requested orders for procedure 10-06-14 pre op 09-29-14 Thanks

## 2014-09-29 ENCOUNTER — Encounter (HOSPITAL_COMMUNITY): Payer: Self-pay

## 2014-09-29 ENCOUNTER — Encounter (HOSPITAL_COMMUNITY)
Admission: RE | Admit: 2014-09-29 | Discharge: 2014-09-29 | Disposition: A | Discharge status: Home / Self Care | Payer: MEDICARE | Source: Ambulatory Visit | Attending: Interventional Radiology | Admitting: Interventional Radiology

## 2014-09-29 DIAGNOSIS — Z01818 Encounter for other preprocedural examination: Secondary | ICD-10-CM | POA: Insufficient documentation

## 2014-09-29 DIAGNOSIS — N2889 Other specified disorders of kidney and ureter: Secondary | ICD-10-CM | POA: Diagnosis not present

## 2014-09-29 HISTORY — DX: Other hypotension: I95.89

## 2014-09-29 HISTORY — DX: Unspecified hearing loss, unspecified ear: H91.90

## 2014-09-29 HISTORY — DX: Personal history of other medical treatment: Z92.89

## 2014-09-29 LAB — BASIC METABOLIC PANEL WITH GFR
Anion gap: 7 (ref 5–15)
BUN: 21 mg/dL (ref 6–23)
CO2: 29 mmol/L (ref 19–32)
Calcium: 9.4 mg/dL (ref 8.4–10.5)
Chloride: 102 mmol/L (ref 96–112)
Creatinine, Ser: 1.13 mg/dL — ABNORMAL HIGH (ref 0.50–1.10)
GFR calc Af Amer: 50 mL/min — ABNORMAL LOW (ref >90)
GFR calc non Af Amer: 43 mL/min — ABNORMAL LOW (ref >90)
Glucose, Bld: 92 mg/dL (ref 70–99)
Potassium: 3.9 mmol/L (ref 3.5–5.1)
Sodium: 138 mmol/L (ref 135–145)

## 2014-09-29 LAB — CBC WITH DIFFERENTIAL/PLATELET
Basophils Absolute: 0 K/uL (ref 0.0–0.1)
Basophils Relative: 0 % (ref 0–1)
Eosinophils Absolute: 0 K/uL (ref 0.0–0.7)
Eosinophils Relative: 1 % (ref 0–5)
HCT: 40 % (ref 36.0–46.0)
Hemoglobin: 13 g/dL (ref 12.0–15.0)
Lymphocytes Relative: 33 % (ref 12–46)
Lymphs Abs: 2.3 K/uL (ref 0.7–4.0)
MCH: 32 pg (ref 26.0–34.0)
MCHC: 32.5 g/dL (ref 30.0–36.0)
MCV: 98.5 fL (ref 78.0–100.0)
Monocytes Absolute: 0.7 K/uL (ref 0.1–1.0)
Monocytes Relative: 10 % (ref 3–12)
Neutro Abs: 3.9 K/uL (ref 1.7–7.7)
Neutrophils Relative %: 56 % (ref 43–77)
Platelets: 252 K/uL (ref 150–400)
RBC: 4.06 MIL/uL (ref 3.87–5.11)
RDW: 13 % (ref 11.5–15.5)
WBC: 7.1 K/uL (ref 4.0–10.5)

## 2014-09-29 LAB — ABO/RH: ABO/RH(D): O POS

## 2014-09-29 LAB — PROTIME-INR
INR: 0.89 (ref 0.00–1.49)
Prothrombin Time: 12.1 seconds (ref 11.6–15.2)

## 2014-09-29 LAB — APTT: APTT: 27 s (ref 24–37)

## 2014-09-29 NOTE — Patient Instructions (Addendum)
22 [name]  09/29/2014   Your procedure is scheduled on:   -2-12 2016 Friday  Enter through [facility] and follow signs to [facility]. Arrive at  0900      AM .  Call this number if you have problems the morning of surgery: [phone]  Or Presurgical Testing [phone].   For Living Will and/or Health Care Power Attorney Forms: please provide copy for your medical record,may bring AM of surgery(Forms should be already notarized -we do not provide this service).(09-29-14 Yes, documents with past medical record.)  Remember: Follow any bowel prep instructions per MD office.    Do not eat food/ or drink: After Midnight.      Take these medicines the morning of surgery with A SIP OF WATER: Levothyroxine. Zantac. Use nasal spary if need. Do not take Potassium, Fluid medicine or Iron supplement AM of procedure.   Do not wear jewelry, make-up or nail polish.  Do not wear deodorant, lotions, powders, or perfumes.   Do not shave legs and under arms- 48 hours(2 days) prior to first CHG shower.(Shaving face and neck okay.)  Do not bring valuables to the hospital.(Hospital is not responsible for lost valuables).  Contacts, dentures or removable bridgework, body piercing, hair pins may not be worn into surgery.  Leave suitcase in the car. After surgery it may be brought to your room.  For patients admitted to the hospital, checkout time is 11:00 AM the day of discharge.(Restricted visitors-Any Persons displaying flu-like symptoms or illness).    Patients discharged the day of surgery will not be allowed to drive home. Must have responsible person with you x 24 hours once discharged.  Name and phone number of your driver: Arlyn Leak, daughter [phone] cell     Please read over the following fact sheets that you were given:  CHG(Chlorhexidine Gluconate 4% Surgical Soap) use.  Remember : Type/Screen "Blue armbands" - may not be removed once applied(would result in  being retested AM of surgery, if removed).         [place] - Preparing for Surgery Before surgery, you can play an important role.  Because skin is not sterile, your skin needs to be as free of germs as possible.  You can reduce the number of germs on your skin by washing with CHG (chlorahexidine gluconate) soap before surgery.  CHG is an antiseptic cleaner which kills germs and bonds with the skin to continue killing germs even after washing. Please DO NOT use if you have an allergy to CHG or antibacterial soaps.  If your skin becomes reddened/irritated stop using the CHG and inform your nurse when you arrive at Short Stay. Do not shave (including legs and underarms) for at least 48 hours prior to the first CHG shower.  You may shave your face/neck. Please follow these instructions carefully:  1.  Shower with CHG Soap the night before surgery and the  morning of Surgery.  2.  If you choose to wash your hair, wash your hair first as usual with your  normal  shampoo.  3.  After you shampoo, rinse your hair and body thoroughly to remove the  shampoo.                           4.  Use CHG as you would any other liquid soap.  You can apply chg directly  to the skin and wash  Gently with a scrungie or clean washcloth.  5.  Apply the CHG Soap to your body ONLY FROM THE NECK DOWN.   Do not use on face/ open                           Wound or open sores. Avoid contact with eyes, ears mouth and genitals (private parts).                       Wash face,  Genitals (private parts) with your normal soap.             6.  Wash thoroughly, paying special attention to the area where your surgery  will be performed.  7.  Thoroughly rinse your body with warm water from the neck down.  8.  DO NOT shower/wash with your normal soap after using and rinsing off  the CHG Soap.                9.  Pat yourself dry with a clean towel.            10.  Wear clean pajamas.            11.  Place clean  sheets on your bed the night of your first shower and do not  sleep with pets. Day of Surgery : Do not apply any lotions/deodorants the morning of surgery.  Please wear clean clothes to the hospital/surgery center.  FAILURE TO FOLLOW THESE INSTRUCTIONS MAY RESULT IN THE CANCELLATION OF YOUR SURGERY PATIENT SIGNATURE_________________________________  NURSE SIGNATURE__________________________________  ________________________________________________________________________

## 2014-09-29 NOTE — Progress Notes (Signed)
BMP results done 09/29/2014 faxed via EPIC to Dr Kathlene Cote.

## 2014-10-03 ENCOUNTER — Other Ambulatory Visit: Payer: Self-pay | Admitting: Radiology

## 2014-10-06 ENCOUNTER — Ambulatory Visit (HOSPITAL_COMMUNITY)
Admission: RE | Admit: 2014-10-06 | Discharge: 2014-10-06 | Disposition: A | Discharge status: Home / Self Care | Payer: MEDICARE | Source: Ambulatory Visit | Attending: Interventional Radiology | Admitting: Interventional Radiology

## 2014-10-06 ENCOUNTER — Ambulatory Visit (HOSPITAL_COMMUNITY): Payer: MEDICARE

## 2014-10-06 ENCOUNTER — Encounter (HOSPITAL_COMMUNITY): Payer: Self-pay

## 2014-10-06 ENCOUNTER — Observation Stay (HOSPITAL_COMMUNITY)
Admission: RE | Admit: 2014-10-06 | Discharge: 2014-10-07 | Disposition: A | Discharge status: Home / Self Care | Payer: MEDICARE | Source: Ambulatory Visit | Attending: Interventional Radiology | Admitting: Interventional Radiology

## 2014-10-06 ENCOUNTER — Encounter (HOSPITAL_COMMUNITY): Payer: Self-pay | Admitting: *Deleted

## 2014-10-06 ENCOUNTER — Other Ambulatory Visit: Payer: Self-pay

## 2014-10-06 ENCOUNTER — Encounter (HOSPITAL_COMMUNITY)
Admission: RE | Disposition: A | Discharge status: Home / Self Care | Payer: Self-pay | Source: Ambulatory Visit | Attending: Interventional Radiology

## 2014-10-06 ENCOUNTER — Ambulatory Visit (HOSPITAL_COMMUNITY): Payer: MEDICARE | Admitting: Certified Registered Nurse Anesthetist

## 2014-10-06 DIAGNOSIS — Z01818 Encounter for other preprocedural examination: Secondary | ICD-10-CM | POA: Diagnosis not present

## 2014-10-06 DIAGNOSIS — F419 Anxiety disorder, unspecified: Secondary | ICD-10-CM | POA: Insufficient documentation

## 2014-10-06 DIAGNOSIS — Z79899 Other long term (current) drug therapy: Secondary | ICD-10-CM | POA: Insufficient documentation

## 2014-10-06 DIAGNOSIS — Z7982 Long term (current) use of aspirin: Secondary | ICD-10-CM | POA: Diagnosis not present

## 2014-10-06 DIAGNOSIS — J449 Chronic obstructive pulmonary disease, unspecified: Secondary | ICD-10-CM

## 2014-10-06 DIAGNOSIS — F329 Major depressive disorder, single episode, unspecified: Secondary | ICD-10-CM | POA: Diagnosis not present

## 2014-10-06 DIAGNOSIS — Z87891 Personal history of nicotine dependence: Secondary | ICD-10-CM | POA: Diagnosis not present

## 2014-10-06 DIAGNOSIS — I252 Old myocardial infarction: Secondary | ICD-10-CM | POA: Diagnosis not present

## 2014-10-06 DIAGNOSIS — E039 Hypothyroidism, unspecified: Secondary | ICD-10-CM | POA: Diagnosis not present

## 2014-10-06 DIAGNOSIS — M810 Age-related osteoporosis without current pathological fracture: Secondary | ICD-10-CM | POA: Insufficient documentation

## 2014-10-06 DIAGNOSIS — N2889 Other specified disorders of kidney and ureter: Principal | ICD-10-CM | POA: Insufficient documentation

## 2014-10-06 DIAGNOSIS — D649 Anemia, unspecified: Secondary | ICD-10-CM | POA: Diagnosis not present

## 2014-10-06 DIAGNOSIS — K219 Gastro-esophageal reflux disease without esophagitis: Secondary | ICD-10-CM | POA: Insufficient documentation

## 2014-10-06 DIAGNOSIS — E785 Hyperlipidemia, unspecified: Secondary | ICD-10-CM | POA: Insufficient documentation

## 2014-10-06 DIAGNOSIS — D495 Neoplasm of unspecified behavior of other genitourinary organs: Secondary | ICD-10-CM | POA: Diagnosis not present

## 2014-10-06 DIAGNOSIS — H9190 Unspecified hearing loss, unspecified ear: Secondary | ICD-10-CM | POA: Insufficient documentation

## 2014-10-06 DIAGNOSIS — H269 Unspecified cataract: Secondary | ICD-10-CM | POA: Insufficient documentation

## 2014-10-06 LAB — BASIC METABOLIC PANEL
Anion gap: 9 (ref 5–15)
BUN: 20 mg/dL (ref 6–23)
CO2: 24 mmol/L (ref 19–32)
Calcium: 8.6 mg/dL (ref 8.4–10.5)
Chloride: 102 mmol/L (ref 96–112)
Creatinine, Ser: 1.04 mg/dL (ref 0.50–1.10)
GFR calc Af Amer: 56 mL/min — ABNORMAL LOW (ref >90)
GFR calc non Af Amer: 48 mL/min — ABNORMAL LOW (ref >90)
Glucose, Bld: 90 mg/dL (ref 70–99)
POTASSIUM: 3.3 mmol/L — AB (ref 3.5–5.1)
Sodium: 135 mmol/L (ref 135–145)

## 2014-10-06 LAB — TYPE AND SCREEN
ABO/RH(D): O POS
Antibody Screen: NEGATIVE

## 2014-10-06 SURGERY — RADIO FREQUENCY ABLATION
Anesthesia: General | Laterality: Right

## 2014-10-06 MED ORDER — CISATRACURIUM BESYLATE (PF) 10 MG/5ML IV SOLN
INTRAVENOUS | Status: DC | PRN
  Administered 2014-10-06: 1 mg via INTRAVENOUS
  Administered 2014-10-06: 4 mg via INTRAVENOUS
  Administered 2014-10-06: 2 mg via INTRAVENOUS

## 2014-10-06 MED ORDER — PROMETHAZINE HCL 25 MG/ML IJ SOLN
INTRAMUSCULAR | Status: AC
  Filled 2014-10-06: qty 1

## 2014-10-06 MED ORDER — CISATRACURIUM BESYLATE 20 MG/10ML IV SOLN
INTRAVENOUS | Status: AC
  Filled 2014-10-06: qty 10

## 2014-10-06 MED ORDER — PROMETHAZINE HCL 25 MG/ML IJ SOLN: 12.5000 mg | INTRAMUSCULAR | Status: DC | PRN

## 2014-10-06 MED ORDER — SODIUM CHLORIDE 0.9 % IV SOLN
INTRAVENOUS | Status: DC
Start: 2014-10-06 — End: 2014-10-07
  Administered 2014-10-06: 17:00:00 via INTRAVENOUS

## 2014-10-06 MED ORDER — FENTANYL CITRATE 0.05 MG/ML IJ SOLN
INTRAMUSCULAR | Status: AC
  Filled 2014-10-06: qty 5

## 2014-10-06 MED ORDER — NEOSTIGMINE METHYLSULFATE 10 MG/10ML IV SOLN
INTRAVENOUS | Status: DC | PRN
  Administered 2014-10-06: 3 mg via INTRAVENOUS

## 2014-10-06 MED ORDER — PROPOFOL 10 MG/ML IV BOLUS
INTRAVENOUS | Status: DC | PRN
  Administered 2014-10-06: 80 mg via INTRAVENOUS

## 2014-10-06 MED ORDER — CEFAZOLIN SODIUM-DEXTROSE 2-3 GM-% IV SOLR
2.0000 g | INTRAVENOUS | Status: AC
  Administered 2014-10-06: 2 g via INTRAVENOUS
  Filled 2014-10-06 (×2): qty 50

## 2014-10-06 MED ORDER — DOCUSATE SODIUM 100 MG PO CAPS
100.0000 mg | ORAL_CAPSULE | Freq: Two times a day (BID) | ORAL | Status: DC
  Administered 2014-10-06 (×2): 100 mg via ORAL
  Filled 2014-10-06 (×5): qty 1

## 2014-10-06 MED ORDER — GLYCOPYRROLATE 0.2 MG/ML IJ SOLN
INTRAMUSCULAR | Status: DC | PRN
Start: 2014-10-06 — End: 2014-10-06
  Administered 2014-10-06: 0.4 mg via INTRAVENOUS

## 2014-10-06 MED ORDER — PHENYLEPHRINE HCL 10 MG/ML IJ SOLN
INTRAMUSCULAR | Status: DC | PRN
  Administered 2014-10-06 (×3): 80 ug via INTRAVENOUS
  Administered 2014-10-06: 40 ug via INTRAVENOUS
  Administered 2014-10-06 (×3): 80 ug via INTRAVENOUS
  Administered 2014-10-06: 40 ug via INTRAVENOUS
  Administered 2014-10-06: 80 ug via INTRAVENOUS

## 2014-10-06 MED ORDER — SUCCINYLCHOLINE CHLORIDE 20 MG/ML IJ SOLN
INTRAMUSCULAR | Status: DC | PRN
  Administered 2014-10-06: 80 mg via INTRAVENOUS

## 2014-10-06 MED ORDER — LACTATED RINGERS IV SOLN
INTRAVENOUS | Status: DC
  Administered 2014-10-06 (×2): via INTRAVENOUS

## 2014-10-06 MED ORDER — FENTANYL CITRATE 0.05 MG/ML IJ SOLN
INTRAMUSCULAR | Status: DC | PRN
  Administered 2014-10-06 (×2): 50 ug via INTRAVENOUS

## 2014-10-06 MED ORDER — DEXAMETHASONE SODIUM PHOSPHATE 10 MG/ML IJ SOLN
INTRAMUSCULAR | Status: AC
  Filled 2014-10-06: qty 1

## 2014-10-06 MED ORDER — TEMAZEPAM 7.5 MG PO CAPS
7.5000 mg | ORAL_CAPSULE | Freq: Once | ORAL | Status: AC
  Administered 2014-10-07: 7.5 mg via ORAL
  Filled 2014-10-06: qty 1

## 2014-10-06 MED ORDER — HYDROMORPHONE HCL 1 MG/ML IJ SOLN: 0.2500 mg | INTRAMUSCULAR | Status: DC | PRN

## 2014-10-06 MED ORDER — PROMETHAZINE HCL 25 MG/ML IJ SOLN
6.2500 mg | INTRAMUSCULAR | Status: DC | PRN
  Administered 2014-10-06: 6.25 mg via INTRAVENOUS

## 2014-10-06 MED ORDER — DEXAMETHASONE SODIUM PHOSPHATE 10 MG/ML IJ SOLN
INTRAMUSCULAR | Status: DC | PRN
  Administered 2014-10-06: 5 mg via INTRAVENOUS

## 2014-10-06 MED ORDER — SODIUM CHLORIDE 0.9 % IJ SOLN
INTRAMUSCULAR | Status: AC
  Filled 2014-10-06: qty 10

## 2014-10-06 MED ORDER — LIDOCAINE HCL (CARDIAC) 20 MG/ML IV SOLN
INTRAVENOUS | Status: DC | PRN
  Administered 2014-10-06: 50 mg via INTRAVENOUS

## 2014-10-06 MED ORDER — SENNOSIDES-DOCUSATE SODIUM 8.6-50 MG PO TABS
1.0000 | ORAL_TABLET | Freq: Every day | ORAL | Status: DC | PRN
  Filled 2014-10-06: qty 1

## 2014-10-06 NOTE — Transfer of Care (Signed)
Immediate Anesthesia Transfer of Care Note  Patient: [name]  Procedure(s) Performed: Procedure(s) (LRB): CRYO ABLATION (Right)  Patient Location: PACU  Anesthesia Type: General  Level of Consciousness: sedated, patient cooperative and responds to stimulation  Airway & Oxygen Therapy: Patient Spontanous Breathing and Patient connected to face mask oxgen  Post-op Assessment: Report given to PACU RN and Post -op Vital signs reviewed and stable  Post vital signs: Reviewed and stable  Complications: No apparent anesthesia complications

## 2014-10-06 NOTE — Anesthesia Procedure Notes (Signed)
Procedure Name: Intubation Date/Time: 10/06/2014 11:48 AM Performed by: Montel Clock Pre-anesthesia Checklist: Patient identified, Emergency Drugs available, Suction available, Patient being monitored and Timeout performed Patient Re-evaluated:Patient Re-evaluated prior to inductionOxygen Delivery Method: Circle system utilized Preoxygenation: Pre-oxygenation with 100% oxygen Intubation Type: IV induction Ventilation: Mask ventilation without difficulty Laryngoscope Size: Mac and 3 Grade View: Grade I Tube type: Oral Tube size: 7.0 mm Number of attempts: 1 Airway Equipment and Method: Stylet Placement Confirmation: ETT inserted through vocal cords under direct vision,  positive ETCO2 and breath sounds checked- equal and bilateral Secured at: 20 cm Tube secured with: Tape Dental Injury: Teeth and Oropharynx as per pre-operative assessment

## 2014-10-06 NOTE — H&P (Signed)
Chief Complaint: Right renal mass  Referring Physician(s): Dr. Karsten Ro  History of Present Illness: [name] is a 81 y.o. female with history of enlarging right renal mass suspicious for renal cell carcinoma . Due to her age and comorbidities she would not be an optimal candidate for surgical resection. She presents today following recent IR consultation with Dr. Kathlene [name] for elective CT guided right renal mass biopsy and percutaneous cryoablation.   Past Medical History  Diagnosis Date  . Anxiety   . Depression   . Hyperlipidemia   . Anemia   . Cataract 05/2011  . Thyroid disease     hypothyroidism  . Allergy   . GERD (gastroesophageal reflux disease)   . Myocardial infarction   . Osteoporosis   . Renal cell carcinoma     found on CT  . Esophageal yeast infection   . Esophageal disorder   . Hypothyroidism   . Transfusion history     last 2004  . Chronic low blood pressure     due to dehydration  . Hearing loss     hearing aids    Past Surgical History  Procedure Laterality Date  . Obstructed ureter      operative repair  . Abdominal hysterectomy    . Total abdominal hysterectomy w/ bilateral salpingoophorectomy    . Kyphosis surgery    . Basal cell carcinoma excision    . Eye surgery  05/2011  . Tonsillectomy    . Appendectomy    . Colonoscopy    . Coronary artery bypass graft      x2 vessel bypass -Dr. Burt [name] -[facility] 06-22-14    Allergies: Zofran; Clarithromycin; Codeine; Diazepam; Sulfonamide derivatives; and Prilosec  Medications: Prior to Admission medications   Medication Sig Start Date End Date Taking? Authorizing Provider  ALPRAZolam (XANAX) 0.5 MG tablet 1 po bid prn Patient taking differently: Take 0.5 mg by mouth 2 (two) times daily as needed for anxiety.  08/11/14  Yes Lindsey Chessman, DO  aspirin 81 MG EC tablet Take 81 mg by mouth daily.     Yes Historical Provider, MD  Cholecalciferol (VITAMIN D3) 1000 UNITS CAPS Take by  mouth daily.     Yes Historical Provider, MD  ferrous fumarate (FERRETTS) 325 (106 FE) MG TABS Take by mouth.     Yes Historical Provider, MD  fluconazole (DIFLUCAN) 100 MG tablet Take 1 tablet (100 mg total) by mouth daily. 03/24/14  Yes Lindsey Banister, MD  fluticasone [facility]) 50 MCG/ACT nasal spray Place 2 sprays into both nostrils daily. Patient taking differently: Place 2 sprays into both nostrils daily as needed for allergies.  09/07/13  Yes Lindsey Chessman, DO  folic acid (FOLVITE) 1 MG tablet Take 1 mg by mouth daily.     Yes Historical Provider, MD  furosemide (LASIX) 40 MG tablet Take 1 tablet (40 mg total) by mouth daily as needed. 08/11/14  Yes Lindsey R Lowne, DO  loratadine-pseudoephedrine (CLARITIN-D 24-HOUR) 10-240 MG per 24 hr tablet Take 1 tablet by mouth daily as needed for allergies.   Yes Historical Provider, MD  metoCLOPramide (REGLAN) 10 MG tablet Take 1 tablet (10 mg total) by mouth at bedtime. 11/23/12  Yes Lindsey Banister, MD  Multiple Vitamin (MULTIVITAMIN WITH MINERALS) TABS tablet Take 1 tablet by mouth daily.   Yes Historical Provider, MD  nystatin (MYCOSTATIN) 100000 UNIT/ML suspension Take 10 mLs (1,000,000 Units total) by mouth 4 (four) times daily. Patient taking differently: Take  10 mLs by mouth See admin instructions. Pt soaks dentures in once every 10 days 03/24/14  Yes Lindsey Banister, MD  potassium chloride SA (K-DUR,KLOR-CON) 20 MEQ tablet Take 1 tablet (20 mEq total) by mouth 2 (two) times daily. 08/11/14  Yes Lindsey Chessman, DO  promethazine (PHENERGAN) 25 MG tablet Take 1 tablet by mouth two  times daily 08/11/14  Yes Lindsey R Lowne, DO  ranitidine (ZANTAC) 150 MG tablet Take 150 mg by mouth 2 (two) times daily.   Yes Historical Provider, MD  rOPINIRole (REQUIP) 1 MG tablet Take 1 tablet (1 mg total) by mouth at bedtime. 08/11/14  Yes Lindsey R Lowne, DO  tiZANidine (ZANAFLEX) 2 MG tablet Take 1 tablet (2 mg total) by mouth 3 (three) times daily. Patient taking  differently: Take 2 mg by mouth 3 (three) times daily as needed for muscle spasms.  05/31/12  Yes Lindsey R Lowne, DO  traMADol (ULTRAM) 50 MG tablet Take 1 tablet (50 mg total) by mouth every 6 (six) hours as needed. Patient taking differently: Take 50 mg by mouth every 6 (six) hours as needed for moderate pain.  08/11/14  Yes Lindsey Chessman, DO  levothyroxine (SYNTHROID, LEVOTHROID) 25 MCG tablet Take 1 tablet by mouth  daily Patient not taking: Reported on 09/27/2014 08/11/14   Lindsey Chessman, DO    Family History  Problem Relation Age of Onset  . Parkinsonism Father   . Cancer Other     breast 1st degree relative  . Hypertension Other   . Heart disease Other     CAD.Marland Kitchen1ST DEGREE RELATIVE <60  . Hyperlipidemia Other   . Colon cancer Neg Hx   . Esophageal cancer Neg Hx   . Rectal cancer Neg Hx   . Stomach cancer Neg Hx     History   Social History  . Marital Status: Single    Spouse Name: N/A  . Number of Children: 2  . Years of Education: N/A   Occupational History  . Retired    Social History Main Topics  . Smoking status: Former Research scientist (life sciences)  . Smokeless tobacco: Never Used  . Alcohol Use: No  . Drug Use: No  . Sexual Activity: Not Currently   Other Topics Concern  . Not on file   Social History Narrative   REG EXERCISE   [employer]   LIVES ALONE   END OF LIFE:PATIENT HAS LIVING WILL AND CLEARLY STATES SHE DOES NOT WANT CARDIAC Gonzales VENTILATION OR OTHER HEROIC OR FUTILE MEASURES.      Review of Systems   Constitutional: Negative for fever and chills.  HENT: Positive for hearing loss.   Respiratory: Negative for cough and shortness of breath.   Cardiovascular: Negative for chest pain.  Gastrointestinal: Negative for nausea, vomiting, abdominal pain and blood in stool.  Genitourinary: Negative for dysuria, hematuria and flank pain.  Musculoskeletal: Negative for back pain.  Neurological:       Occ HA's     Vital Signs: BP 106/50  HR 67  R 18   TEMP 97.4  O2 SATS 99% RA Physical Exam  Constitutional: She is oriented to person, place, and time.  Thin WF in NAD  Cardiovascular: Normal rate.   Ectopy noted (sinus with PAC's)  Pulmonary/Chest: Effort normal and breath sounds normal.  Abdominal: Soft. Bowel sounds are normal. There is no tenderness.  Musculoskeletal: Normal range of motion. She exhibits no edema.  Neurological: She is alert and oriented to person, place, and  time.    Imaging: No results found.  Labs:  CBC:  Recent Labs  08/11/14 0925 09/29/14 1445  WBC 8.8 7.1  HGB 14.9 13.0  HCT 45.5 40.0  PLT 317.0 252    COAGS:  Recent Labs  09/29/14 1445  INR 0.89  APTT 27    BMP:  Recent Labs  10/27/13 0733 04/05/14 0729 08/11/14 0925 09/22/14 0848 09/29/14 1445  NA 138  --  134* 137 138  K 4.0  --  3.1* 3.9 3.9  CL 106  --  97 106 102  CO2 25  --  24 24 29   GLUCOSE 78  --  92 98 92  BUN 9 14 22 20 21   CALCIUM 9.0  --  9.0 9.1 9.4  CREATININE 1.0 0.8 1.7* 0.96 1.13*  GFRNONAA  --   --   --   --  43*  GFRAA  --   --   --   --  50*    LIVER FUNCTION TESTS:  Recent Labs  08/11/14 0925  BILITOT 0.5  AST 30  ALT 28  ALKPHOS 62  PROT 7.0  ALBUMIN 3.9    TUMOR MARKERS: No results for input(s): AFPTM, CEA, CA199, CHROMGRNA in the last 8760 hours.  Assessment and Plan: ROSANNE WOHLFARTH is a 81 y.o. female with history of enlarging right renal mass suspicious for renal cell carcinoma . Due to her age and comorbidities she would not be an optimal candidate for surgical resection. She presents today following recent IR consultation with Dr. Kathlene [name] for elective CT guided right renal mass biopsy and percutaneous cryoablation. Details/risks of procedure d/w pt/daughter with their understanding and consent. Following procedure pt will be admitted for overnight observation.      SignedAutumn [name] 10/06/2014, 8:47 AM

## 2014-10-06 NOTE — Progress Notes (Signed)
Day of Surgery  Subjective: Pt without acute c/o  Objective: Vital signs in last 24 hours: Temp:  [97.4 F (36.3 C)-97.6 F (36.4 C)] 97.6 F (36.4 C) (02/12 1556) Pulse Rate:  [57-77] 65 (02/12 1556) Resp:  [9-19] 16 (02/12 1556) BP: (106-145)/(50-69) 113/57 mmHg (02/12 1556) SpO2:  [96 %-100 %] 100 % (02/12 1556) Weight:  [92 lb (41.731 kg)] 92 lb (41.731 kg) (02/12 0947)    Intake/Output from previous day:   Intake/Output this shift: Total I/O In: 1400 [I.V.:1400] Out: 250 [Urine:250]   pt awake/alert; puncture site rt flank clean and dry,NT , dressing in place; yellow urine in foley  Lab Results:  No results for input(s): WBC, HGB, HCT, PLT in the last 72 hours. BMET  Recent Labs  10/06/14 0943  NA 135  K 3.3*  CL 102  CO2 24  GLUCOSE 90  BUN 20  CREATININE 1.04  CALCIUM 8.6   PT/INR No results for input(s): LABPROT, INR in the last 72 hours. ABG No results for input(s): PHART, HCO3 in the last 72 hours.  Invalid input(s): PCO2, PO2  Studies/Results: Dg Chest Port 1 View  10/06/2014   CLINICAL DATA:  Preoperative evaluation. For cryoablation of right renal tumor.  EXAM: PORTABLE CHEST - 1 VIEW  COMPARISON:  Chest x-ray dated 07/23/2012  FINDINGS: Heart size and pulmonary vascularity are normal and the lungs are clear. The lungs do appear hyperinflated suggesting emphysema. There is a thoracolumbar scoliosis. Old compression fracture of L1.  IMPRESSION: No acute abnormalities.  Possible emphysema.   Electronically Signed   By: Lorriane Shire M.D.   On: 10/06/2014 10:10    Anti-infectives: Anti-infectives    Start     Dose/Rate Route Frequency Ordered Stop   10/06/14 0945  ceFAZolin (ANCEF) IVPB 2 g/50 mL premix     2 g 100 mL/hr over 30 Minutes Intravenous On call 10/06/14 0932 10/06/14 1200      Assessment/Plan: s/p CT guided perc cryoablation rt renal mass today; for overnight obs; check am labs; d/c foley later this evening; f/u with Dr. Kathlene Cote  in [place] clinic in 2-3 months     ALLRED,D [facility] 10/06/2014

## 2014-10-06 NOTE — Procedures (Signed)
Procedure:  CT guided cryoablation of right renal mass Anesthesia:  General Findings:  Right renal mass ablated with 2 Galil Ice Sphere probes.  Hydrodissection performed with 90 mL of diluted contrast/saline mixture (3 mL Omni 300 in 200 mL NS). Good ice ball coverage of mass.  Biopsy not possible with ablation probes in place. Plan:  PACU recovery followed by overnight observation.

## 2014-10-06 NOTE — Anesthesia Preprocedure Evaluation (Addendum)
Anesthesia Evaluation  Patient identified by MRN, date of birth, ID band Patient awake    Reviewed: Allergy & Precautions, NPO status , Patient's Chart, lab work & pertinent test results  History of Anesthesia Complications Negative for: history of anesthetic complications  Airway Mallampati: I  TM Distance: >3 FB Neck ROM: Full    Dental  (+) Teeth Intact, Chipped, Dental Advisory Given   Pulmonary former smoker,    Pulmonary exam normal       Cardiovascular + Past MI  Study Conclusions  Left ventricle: The cavity size was normal. Wall thickness was normal. Systolic function was normal. The estimated ejection fraction was in the range of 50% to 55%.     Neuro/Psych PSYCHIATRIC DISORDERS Anxiety Depression negative neurological ROS     GI/Hepatic Neg liver ROS, GERD-  ,  Endo/Other  Hypothyroidism   Renal/GU Renal disease     Musculoskeletal   Abdominal   Peds  Hematology   Anesthesia Other Findings   Reproductive/Obstetrics                           Anesthesia Physical Anesthesia Plan  ASA: III  Anesthesia Plan: General   Post-op Pain Management:    Induction: Intravenous  Airway Management Planned: Oral ETT  Additional Equipment:   Intra-op Plan:   Post-operative Plan: Extubation in OR  Informed Consent: I have reviewed the patients History and Physical, chart, labs and discussed the procedure including the risks, benefits and alternatives for the proposed anesthesia with the patient or authorized representative who has indicated his/her understanding and acceptance.   Consent reviewed with POA  Plan Discussed with: CRNA, Anesthesiologist and Surgeon  Anesthesia Plan Comments:        Anesthesia Quick Evaluation

## 2014-10-06 NOTE — Anesthesia Postprocedure Evaluation (Signed)
Anesthesia Post Note  Patient: [name]  Procedure(s) Performed: Procedure(s) (LRB): CRYO ABLATION (Right)  Anesthesia type: general  Patient location: PACU  Post pain: Pain level controlled  Post assessment: Patient's Cardiovascular Status Stable  Last Vitals:  Filed Vitals:   10/06/14 1500  BP: 136/58  Pulse: 57  Temp:   Resp: 16    Post vital signs: Reviewed and stable  Level of consciousness: sedated  Complications: No apparent anesthesia complications

## 2014-10-07 ENCOUNTER — Other Ambulatory Visit: Payer: Self-pay | Admitting: Radiology

## 2014-10-07 DIAGNOSIS — F329 Major depressive disorder, single episode, unspecified: Secondary | ICD-10-CM | POA: Diagnosis not present

## 2014-10-07 DIAGNOSIS — H269 Unspecified cataract: Secondary | ICD-10-CM | POA: Diagnosis not present

## 2014-10-07 DIAGNOSIS — N2889 Other specified disorders of kidney and ureter: Secondary | ICD-10-CM

## 2014-10-07 DIAGNOSIS — E785 Hyperlipidemia, unspecified: Secondary | ICD-10-CM | POA: Diagnosis not present

## 2014-10-07 DIAGNOSIS — D649 Anemia, unspecified: Secondary | ICD-10-CM | POA: Diagnosis not present

## 2014-10-07 DIAGNOSIS — F419 Anxiety disorder, unspecified: Secondary | ICD-10-CM | POA: Diagnosis not present

## 2014-10-07 LAB — CBC
HEMATOCRIT: 35.1 % — AB (ref 36.0–46.0)
Hemoglobin: 11.3 g/dL — ABNORMAL LOW (ref 12.0–15.0)
MCH: 32 pg (ref 26.0–34.0)
MCHC: 32.2 g/dL (ref 30.0–36.0)
MCV: 99.4 fL (ref 78.0–100.0)
PLATELETS: 215 10*3/uL (ref 150–400)
RBC: 3.53 MIL/uL — AB (ref 3.87–5.11)
RDW: 12.9 % (ref 11.5–15.5)
WBC: 7.6 10*3/uL (ref 4.0–10.5)

## 2014-10-07 LAB — BASIC METABOLIC PANEL
Anion gap: 6 (ref 5–15)
BUN: 15 mg/dL (ref 6–23)
CO2: 24 mmol/L (ref 19–32)
CREATININE: 0.89 mg/dL (ref 0.50–1.10)
Calcium: 7.9 mg/dL — ABNORMAL LOW (ref 8.4–10.5)
Chloride: 104 mmol/L (ref 96–112)
GFR calc Af Amer: 67 mL/min — ABNORMAL LOW (ref >90)
GFR, EST NON AFRICAN AMERICAN: 58 mL/min — AB (ref >90)
GLUCOSE: 92 mg/dL (ref 70–99)
Potassium: 3.2 mmol/L — ABNORMAL LOW (ref 3.5–5.1)
SODIUM: 134 mmol/L — AB (ref 135–145)

## 2014-10-07 NOTE — Progress Notes (Signed)
Utilization Review completed.  

## 2014-10-07 NOTE — Discharge Summary (Signed)
Patient ID: CAIDEN MONSIVAIS MRN: [id number] DOB/AGE: 79-Mar-1929 79 y.o.  Admit date: 10/06/2014 Discharge date: 10/07/2014  Admission Diagnoses: Right renal mass  Discharge Diagnoses: Right renal mass, status post CT-guided cryoablation via general anesthesia on 10/06/2014 Active Problems:   Right renal mass  Past Medical History  Diagnosis Date  . Anxiety   . Depression   . Hyperlipidemia   . Anemia   . Cataract 05/2011  . Thyroid disease     hypothyroidism  . Allergy   . GERD (gastroesophageal reflux disease)   . Myocardial infarction   . Osteoporosis   . Renal cell carcinoma     found on CT  . Esophageal yeast infection   . Esophageal disorder   . Hypothyroidism   . Transfusion history     last 2004  . Chronic low blood pressure     due to dehydration  . Hearing loss     hearing aids   Past Surgical History  Procedure Laterality Date  . Obstructed ureter      operative repair  . Abdominal hysterectomy    . Total abdominal hysterectomy w/ bilateral salpingoophorectomy    . Kyphosis surgery    . Basal cell carcinoma excision    . Eye surgery  05/2011  . Tonsillectomy    . Appendectomy    . Colonoscopy    . Coronary artery bypass graft      x2 vessel bypass -Dr. Burt Knack -[facility] 06-22-14    Discharged Condition: good  Hospital Course: Mrs. Eastburn is an 81 year old white female, patient of Dr. Kathie Rhodes, who was recently referred to the interventional radiology service for further evaluation and potential treatment of an enlarging right renal mass. Due to her age and comorbidities she was not an optimal candidate for surgical resection. The exophytic posterior lateral tumor of the mid right kidney measured approximately 1.6 x 2.2 cm. Following consultation with Dr. Kathlene Cote the patient was deemed an appropriate candidate for cryoablation and she underwent this procedure on 10/06/2014 via general anesthesia. Biopsy of the mass was not possible with  ablation probes in place. The patient tolerated the procedure well and was admitted for overnight observation. Overnight the patient did well with only complaint being mild sore throat and minimal nausea. On the morning of discharge the patient was doing very well. She continued to have mild sore throat but no significant abdominal pain, nausea ,vomiting or respiratory difficulties. She was able to tolerate her diet, void and ambulate without difficulty. There was no evidence of hematuria.  Follow-up lab values revealed mild hypokalemia at 3.2, creatinine 0.89, white blood cell 7.6 and hemoglobin 11.3. Following discussion with IR attending the patient was deemed stable for discharge at this time. She will continue on her current home medications which include potassium supplementation. She'll be scheduled for follow-up with Dr. Kathlene Cote in the interventional radiology clinic in 2-3 months. She was told to contact our service in the interim with any additional questions or concerns.  Consults: none  Significant Diagnostic Studies:  Results for orders placed or performed during the hospital encounter of 40/98/11  Basic metabolic panel  Result Value Ref Range   Sodium 135 135 - 145 mmol/L   Potassium 3.3 (L) 3.5 - 5.1 mmol/L   Chloride 102 96 - 112 mmol/L   CO2 24 19 - 32 mmol/L   Glucose, Bld 90 70 - 99 mg/dL   BUN 20 6 - 23 mg/dL   Creatinine, Ser 1.04 0.50 -  1.10 mg/dL   Calcium 8.6 8.4 - 10.5 mg/dL   GFR calc non Af Amer 48 (L) >90 mL/min   GFR calc Af Amer 56 (L) >90 mL/min   Anion gap 9 5 - 15  CBC  Result Value Ref Range   WBC 7.6 4.0 - 10.5 K/uL   RBC 3.53 (L) 3.87 - 5.11 MIL/uL   Hemoglobin 11.3 (L) 12.0 - 15.0 g/dL   HCT 35.1 (L) 36.0 - 46.0 %   MCV 99.4 78.0 - 100.0 fL   MCH 32.0 26.0 - 34.0 pg   MCHC 32.2 30.0 - 36.0 g/dL   RDW 12.9 11.5 - 15.5 %   Platelets 215 150 - 400 K/uL  Basic metabolic panel  Result Value Ref Range   Sodium 134 (L) 135 - 145 mmol/L   Potassium 3.2  (L) 3.5 - 5.1 mmol/L   Chloride 104 96 - 112 mmol/L   CO2 24 19 - 32 mmol/L   Glucose, Bld 92 70 - 99 mg/dL   BUN 15 6 - 23 mg/dL   Creatinine, Ser 0.89 0.50 - 1.10 mg/dL   Calcium 7.9 (L) 8.4 - 10.5 mg/dL   GFR calc non Af Amer 58 (L) >90 mL/min   GFR calc Af Amer 67 (L) >90 mL/min   Anion gap 6 5 - 15     Treatments: Ct Guide Tissue Ablation  10/06/2014   CLINICAL DATA:  Solid, enhancing renal renal tumor measuring approximately 1.6 x 2.2 cm and demonstrating growth over time. The patient presents for percutaneous cryoablation of the tumor.  EXAM: CT-GUIDED PERCUTANEOUS CRYOABLATION OF RIGHT RENAL MASS  ANESTHESIA/SEDATION: General  MEDICATIONS: 2 g IV Ancef. As antibiotic prophylaxis, Ancef was ordered pre-procedure and administered intravenously within one hour of incision.  CONTRAST:  None  PROCEDURE: The procedure, risks, benefits, and alternatives were explained to the patient. Questions regarding the procedure were encouraged and answered. The patient understands and consents to the procedure.  The patient was placed under general anesthesia. Initial unenhanced CT was performed in a prone position to localize the right renal mass.  The right flank region was prepped with Betadine in a sterile fashion, and a sterile drape was applied covering the operative field. A sterile gown and sterile gloves were used for the procedure. A time-out was performed prior to the procedure.  Under CT guidance, 2 separate Galil Ice Sphere percutaneous cryoablation probes were advanced into the right renal mass. Probe positioning was confirmed by CT prior to cryoablation. Hydrodissection was performed lateral to the right kidney via a 22 gauge spinal needle. A mixture of 3 mL of Omnipaque 300 contrast and 200 mL of sterile saline was made. A total volume of the 90 mL of this diluted contrast mixture was injected during hydrodissection.  Cryoablation was performed through the 2 probes simultaneously. Initial 10  minute cycle of cryoablation was performed. This was followed by a thaw cycle. A second 10 minute cycle of cryoablation was then performed. During ablation, periodic CT imaging was performed to monitor ice ball formation and morphology. After active thaw, the cryoablation probes were removed.  COMPLICATIONS: None  FINDINGS: The exophytic posterolateral tumor of the mid right kidney was localized, measuring roughly 2.1 cm in greatest diameter. After positioning of both cryoablation probes as well as hydrodissection to move the ascending colon away from the renal lesion, there was no room available to advance an additional biopsy needle safely. Biopsy was therefore not performed and primary treatment was performed of the  lesion.  Hydrodissection was successful in displacing the colon and allowing safe ablation. During cryoablation, monitoring CT demonstrates adequate ice ball formation encompassing the renal tumor.  IMPRESSION: CT guided percutaneous cryoablation of right renal mass. The patient will be observed overnight. Initial follow-up will be performed in approximately 2 months.   Electronically Signed   By: Aletta Edouard M.D.   On: 10/06/2014 16:59   Dg Chest Port 1 View  10/06/2014   CLINICAL DATA:  Preoperative evaluation. For cryoablation of right renal tumor.  EXAM: PORTABLE CHEST - 1 VIEW  COMPARISON:  Chest x-ray dated 07/23/2012  FINDINGS: Heart size and pulmonary vascularity are normal and the lungs are clear. The lungs do appear hyperinflated suggesting emphysema. There is a thoracolumbar scoliosis. Old compression fracture of L1.  IMPRESSION: No acute abnormalities.  Possible emphysema.   Electronically Signed   By: Lorriane Shire M.D.   On: 10/06/2014 10:10     Discharge Exam: Blood pressure 111/57, pulse 66, temperature 98.1 F (36.7 C), temperature source Oral, resp. rate 16, height 5\' 3"  (1.6 m), weight 92 lb (41.731 kg), SpO2 100 %. patient is awake, alert and oriented. Chest is clear  to auscultation bilaterally; heart with regular rate and occasional ectopy; abdomen soft, positive bowel sounds, nontender, nondistended; puncture site right flank clean, dry, nontender, no hematoma. Extremities with full range of motion and no significant edema.   Disposition: home  Discharge Instructions    (HEART FAILURE PATIENTS) Call MD:  Anytime you have any of the following symptoms: 1) 3 pound weight gain in 24 hours or 5 pounds in 1 week 2) shortness of breath, with or without a dry hacking cough 3) swelling in the hands, feet or stomach 4) if you have to sleep on extra pillows at night in order to breathe.    Complete by:  As directed      Call MD for:  difficulty breathing, headache or visual disturbances    Complete by:  As directed      Call MD for:  extreme fatigue    Complete by:  As directed      Call MD for:  hives    Complete by:  As directed      Call MD for:  persistant dizziness or light-headedness    Complete by:  As directed      Call MD for:  persistant nausea and vomiting    Complete by:  As directed      Call MD for:  redness, tenderness, or signs of infection (pain, swelling, redness, odor or green/yellow discharge around incision site)    Complete by:  As directed      Call MD for:  severe uncontrolled pain    Complete by:  As directed      Call MD for:  temperature >100.4    Complete by:  As directed      Change dressing (specify)    Complete by:  As directed   May change bandage over right flank region daily and keep in place for the next 2-3 days. May wash site with soap and water.     Diet - low sodium heart healthy    Complete by:  As directed      Increase activity slowly    Complete by:  As directed      Lifting restrictions    Complete by:  As directed   No heavy lifting for the next 3-4 days.     May shower /  Bathe    Complete by:  As directed      May walk up steps    Complete by:  As directed             Medication List    TAKE these  medications        ALPRAZolam 0.5 MG tablet  Commonly known as:  XANAX  1 po bid prn     aspirin 81 MG EC tablet  Take 81 mg by mouth daily.     FERRETTS 325 (106 FE) MG Tabs tablet  Generic drug:  ferrous fumarate  Take by mouth.     fluconazole 100 MG tablet  Commonly known as:  DIFLUCAN  Take 1 tablet (100 mg total) by mouth daily.     fluticasone 50 MCG/ACT nasal spray  Commonly known as:  FLONASE  Place 2 sprays into both nostrils daily.     folic acid 1 MG tablet  Commonly known as:  FOLVITE  Take 1 mg by mouth daily.     furosemide 40 MG tablet  Commonly known as:  LASIX  Take 1 tablet (40 mg total) by mouth daily as needed.     levothyroxine 25 MCG tablet  Commonly known as:  SYNTHROID, LEVOTHROID  Take 25 mcg by mouth daily before breakfast.     levothyroxine 25 MCG tablet  Commonly known as:  SYNTHROID, LEVOTHROID  Take 1 tablet by mouth  daily     loratadine-pseudoephedrine 10-240 MG per 24 hr tablet  Commonly known as:  CLARITIN-D 24-hour  Take 1 tablet by mouth daily as needed for allergies.     metoCLOPramide 10 MG tablet  Commonly known as:  REGLAN  Take 1 tablet (10 mg total) by mouth at bedtime.     multivitamin with minerals Tabs tablet  Take 1 tablet by mouth daily.     nystatin 100000 UNIT/ML suspension  Commonly known as:  MYCOSTATIN  Take 10 mLs (1,000,000 Units total) by mouth 4 (four) times daily.     potassium chloride SA 20 MEQ tablet  Commonly known as:  K-DUR,KLOR-CON  Take 1 tablet (20 mEq total) by mouth 2 (two) times daily.     promethazine 25 MG tablet  Commonly known as:  PHENERGAN  Take 1 tablet by mouth two  times daily     ranitidine 150 MG tablet  Commonly known as:  ZANTAC  Take 150 mg by mouth 2 (two) times daily.     rOPINIRole 1 MG tablet  Commonly known as:  REQUIP  Take 1 tablet (1 mg total) by mouth at bedtime.     tiZANidine 2 MG tablet  Commonly known as:  ZANAFLEX  Take 1 tablet (2 mg total) by mouth  3 (three) times daily.     traMADol 50 MG tablet  Commonly known as:  ULTRAM  Take 1 tablet (50 mg total) by mouth every 6 (six) hours as needed.     Vitamin D3 1000 UNITS Caps  Take by mouth daily.           Follow-up Information    Follow up with [facility] T, MD.   Specialty:  Interventional Radiology   Why:  Radiology will call you with follow up appointment with Dr. Kathlene Cote in 2-3 months. Please call [phone] or [phone] with any additional questions or concerns.   Contact information:   [address] [place] [address] [phone]       Follow up with Kathie Rhodes  C, MD.   Specialty:  Urology   Why:  Continue follow-up with Dr. Karsten Ro as needed   Contact information:   [address] [place] [address] [phone]       I have spent less than 30 minutes coordinating discharge for [name].    SignedRowe Robert, [facility]        10/07/2014, 9:03 AM

## 2014-10-07 NOTE — Progress Notes (Signed)
Patient discharged home, all discharge medications and instructions reviewed and questions answered. Patient to be assisted to vehicle by wheelchair.  

## 2014-10-07 NOTE — Discharge Instructions (Signed)
Cryoablation, Care After Refer to this sheet in the next few weeks. These instructions provide you with information on caring for yourself after your procedure. Your health care provider may also give you more specific instructions. Your treatment has been planned according to current medical practices, but problems sometimes occur. Call your health care provider if you have any problems or questions after your procedure.  WHAT TO EXPECT AFTER THE PROCEDURE After your procedure, it is typical to have the following:  Soreness around your puncture sites for 3-5 days. Some minor bruising may also be present. You will be given pain medicines to control the pain.  Mild abdominal, flank, or right shoulder pain for 24 hours. HOME CARE INSTRUCTIONS  Only take over-the-counter or prescription medicines as directed by your health care provider. Take all medicines exactly as directed.  Follow any prescribed diet.  Follow your health care provider's instructions regarding rest and physical activity. You should be able to go back to your normal level of activity within several days of the procedure. SEEK MEDICAL CARE IF:  You cannot pass gas.  You are not able to have a bowel movement within 2 days.  You have sickness in your stomach (nausea) or vomiting.  You have redness or drainage from any of your puncture sites. SEEK IMMEDIATE MEDICAL CARE IF:  You have severe or lasting abdominal pain or pain in your shoulder or back.   You have a fever.   You have a skin rash.   You have trouble swallowing or breathing.   You have severe weakness or dizziness.  You have chest pain or shortness of breath.  Document Released: 06/01/2013 Document Reviewed: 06/01/2013 [employer] Patient Information 2015 [employer]. This information is not intended to replace advice given to you by your health care provider. Make sure you discuss any questions you have with your health care provider.   

## 2014-10-26 DIAGNOSIS — J309 Allergic rhinitis, unspecified: Secondary | ICD-10-CM | POA: Diagnosis not present

## 2014-10-26 DIAGNOSIS — H9071 Mixed conductive and sensorineural hearing loss, unilateral, right ear, with unrestricted hearing on the contralateral side: Secondary | ICD-10-CM | POA: Diagnosis not present

## 2014-10-26 DIAGNOSIS — H9042 Sensorineural hearing loss, unilateral, left ear, with unrestricted hearing on the contralateral side: Secondary | ICD-10-CM | POA: Diagnosis not present

## 2014-10-26 DIAGNOSIS — H905 Unspecified sensorineural hearing loss: Secondary | ICD-10-CM | POA: Diagnosis not present

## 2014-11-01 ENCOUNTER — Other Ambulatory Visit: Payer: Self-pay | Admitting: *Deleted

## 2014-11-01 ENCOUNTER — Other Ambulatory Visit (HOSPITAL_COMMUNITY): Payer: Self-pay | Admitting: Interventional Radiology

## 2014-11-01 DIAGNOSIS — N2889 Other specified disorders of kidney and ureter: Secondary | ICD-10-CM

## 2014-12-19 DIAGNOSIS — L570 Actinic keratosis: Secondary | ICD-10-CM | POA: Diagnosis not present

## 2014-12-19 DIAGNOSIS — L821 Other seborrheic keratosis: Secondary | ICD-10-CM | POA: Diagnosis not present

## 2014-12-19 DIAGNOSIS — D692 Other nonthrombocytopenic purpura: Secondary | ICD-10-CM | POA: Diagnosis not present

## 2014-12-19 DIAGNOSIS — N289 Disorder of kidney and ureter, unspecified: Secondary | ICD-10-CM | POA: Diagnosis not present

## 2014-12-19 DIAGNOSIS — N2889 Other specified disorders of kidney and ureter: Secondary | ICD-10-CM | POA: Diagnosis not present

## 2014-12-20 LAB — CREATININE WITH EST GFR
Creat: 0.96 mg/dL (ref 0.50–1.10)
GFR, Est African American: 63 mL/min
GFR, Est Non African American: 54 mL/min — ABNORMAL LOW

## 2014-12-20 LAB — BUN: BUN: 16 mg/dL (ref 6–23)

## 2014-12-26 ENCOUNTER — Ambulatory Visit
Admission: RE | Admit: 2014-12-26 | Discharge: 2014-12-26 | Disposition: A | Discharge status: Home / Self Care | Payer: MEDICARE | Source: Ambulatory Visit | Attending: Radiology | Admitting: Radiology

## 2014-12-26 ENCOUNTER — Ambulatory Visit (HOSPITAL_COMMUNITY)
Admission: RE | Admit: 2014-12-26 | Discharge: 2014-12-26 | Disposition: A | Discharge status: Home / Self Care | Payer: MEDICARE | Source: Ambulatory Visit | Attending: Interventional Radiology | Admitting: Interventional Radiology

## 2014-12-26 ENCOUNTER — Encounter (HOSPITAL_COMMUNITY): Payer: Self-pay

## 2014-12-26 DIAGNOSIS — N2889 Other specified disorders of kidney and ureter: Secondary | ICD-10-CM | POA: Diagnosis not present

## 2014-12-26 DIAGNOSIS — K828 Other specified diseases of gallbladder: Secondary | ICD-10-CM | POA: Diagnosis not present

## 2014-12-26 DIAGNOSIS — N200 Calculus of kidney: Secondary | ICD-10-CM | POA: Diagnosis not present

## 2014-12-26 MED ORDER — IOHEXOL 300 MG/ML  SOLN: 80.0000 mL | Freq: Once | INTRAMUSCULAR | Status: DC | PRN

## 2014-12-26 MED ORDER — IOHEXOL 300 MG/ML  SOLN
100.0000 mL | Freq: Once | INTRAMUSCULAR | Status: AC | PRN
  Administered 2014-12-26: 80 mL via INTRAVENOUS

## 2014-12-27 DIAGNOSIS — N2889 Other specified disorders of kidney and ureter: Secondary | ICD-10-CM | POA: Diagnosis not present

## 2014-12-27 NOTE — Progress Notes (Signed)
Chief Complaint: Chief Complaint  Patient presents with  . Follow-up    3 mo follow up Cryoablation of Right Renal Mass    History of Present Illness: [name] is a 81 y.o. female status post percutaneous cryoablation of a right renal neoplasm on 10/06/2014. [name] returns for follow-up. She tolerated ablation well with no complications or recurrent symptoms.  Past Medical History  Diagnosis Date  . Anxiety   . Depression   . Hyperlipidemia   . Anemia   . Cataract 05/2011  . Thyroid disease     hypothyroidism  . Allergy   . GERD (gastroesophageal reflux disease)   . Myocardial infarction   . Osteoporosis   . Esophageal yeast infection   . Esophageal disorder   . Hypothyroidism   . Transfusion history     last 2004  . Chronic low blood pressure     due to dehydration  . Hearing loss     hearing aids  . Renal cell carcinoma     found on CT    Past Surgical History  Procedure Laterality Date  . Obstructed ureter      operative repair  . Abdominal hysterectomy    . Total abdominal hysterectomy w/ bilateral salpingoophorectomy    . Kyphosis surgery    . Basal cell carcinoma excision    . Eye surgery  05/2011  . Tonsillectomy    . Appendectomy    . Colonoscopy    . Coronary artery bypass graft      x2 vessel bypass -Dr. Burt Knack -[facility] 06-22-14    Allergies: Zofran; Clarithromycin; Codeine; Diazepam; Sulfonamide derivatives; and Prilosec  Medications: Prior to Admission medications   Medication Sig Start Date End Date Taking? Authorizing Provider  ALPRAZolam (XANAX) 0.5 MG tablet 1 po bid prn Patient taking differently: Take 0.5 mg by mouth 2 (two) times daily as needed for anxiety.  08/11/14  Yes Rosalita Chessman, DO  aspirin 81 MG EC tablet Take 81 mg by mouth daily.     Yes Historical Provider, MD  Cholecalciferol (VITAMIN D3) 1000 UNITS CAPS Take by mouth daily.     Yes Historical Provider, MD  ferrous fumarate (FERRETTS) 325 (106 FE) MG  TABS Take by mouth.     Yes Historical Provider, MD  fluconazole (DIFLUCAN) 100 MG tablet Take 1 tablet (100 mg total) by mouth daily. 03/24/14  Yes Milus Banister, MD  fluticasone [facility]) 50 MCG/ACT nasal spray Place 2 sprays into both nostrils daily. Patient taking differently: Place 2 sprays into both nostrils daily as needed for allergies.  09/07/13  Yes Rosalita Chessman, DO  folic acid (FOLVITE) 1 MG tablet Take 1 mg by mouth daily.     Yes Historical Provider, MD  furosemide (LASIX) 40 MG tablet Take 1 tablet (40 mg total) by mouth daily as needed. 08/11/14  Yes Rosalita Chessman, DO  levothyroxine (SYNTHROID, LEVOTHROID) 25 MCG tablet Take 25 mcg by mouth daily before breakfast.   Yes Historical Provider, MD  loratadine-pseudoephedrine (CLARITIN-D 24-HOUR) 10-240 MG per 24 hr tablet Take 1 tablet by mouth daily as needed for allergies.   Yes Historical Provider, MD  metoCLOPramide (REGLAN) 10 MG tablet Take 1 tablet (10 mg total) by mouth at bedtime. 11/23/12  Yes Milus Banister, MD  Multiple Vitamin (MULTIVITAMIN WITH MINERALS) TABS tablet Take 1 tablet by mouth daily.   Yes Historical Provider, MD  nystatin (MYCOSTATIN) 100000 UNIT/ML suspension Take 10 mLs (1,000,000 Units total)  by mouth 4 (four) times daily. Patient taking differently: Take 10 mLs by mouth See admin instructions. Pt soaks dentures in once every 10 days 03/24/14  Yes Milus Banister, MD  potassium chloride SA (K-DUR,KLOR-CON) 20 MEQ tablet Take 1 tablet (20 mEq total) by mouth 2 (two) times daily. 08/11/14  Yes Rosalita Chessman, DO  promethazine (PHENERGAN) 25 MG tablet Take 1 tablet by mouth two  times daily 08/11/14  Yes Yvonne R Lowne, DO  ranitidine (ZANTAC) 150 MG tablet Take 150 mg by mouth 2 (two) times daily.   Yes Historical Provider, MD  rOPINIRole (REQUIP) 1 MG tablet Take 1 tablet (1 mg total) by mouth at bedtime. 08/11/14  Yes Yvonne R Lowne, DO  tiZANidine (ZANAFLEX) 2 MG tablet Take 1 tablet (2 mg total) by mouth 3  (three) times daily. Patient taking differently: Take 2 mg by mouth 3 (three) times daily as needed for muscle spasms.  05/31/12  Yes Yvonne R Lowne, DO  traMADol (ULTRAM) 50 MG tablet Take 1 tablet (50 mg total) by mouth every 6 (six) hours as needed. Patient taking differently: Take 50 mg by mouth every 6 (six) hours as needed for moderate pain.  08/11/14  Yes Rosalita Chessman, DO  levothyroxine (SYNTHROID, LEVOTHROID) 25 MCG tablet Take 1 tablet by mouth  daily Patient not taking: Reported on 09/27/2014 08/11/14   Rosalita Chessman, DO    Family History  Problem Relation Age of Onset  . Parkinsonism Father   . Cancer Other     breast 1st degree relative  . Hypertension Other   . Heart disease Other     CAD.Marland Kitchen1ST DEGREE RELATIVE <60  . Hyperlipidemia Other   . Colon cancer Neg Hx   . Esophageal cancer Neg Hx   . Rectal cancer Neg Hx   . Stomach cancer Neg Hx     History   Social History  . Marital Status: Single    Spouse Name: N/A  . Number of Children: 2  . Years of Education: N/A   Occupational History  . Retired    Social History Main Topics  . Smoking status: Former Research scientist (life sciences)  . Smokeless tobacco: Never Used  . Alcohol Use: No  . Drug Use: No  . Sexual Activity: Not Currently   Other Topics Concern  . Not on file   Social History Narrative   REG EXERCISE   WIDOW   LIVES ALONE   END OF LIFE:PATIENT HAS LIVING WILL AND CLEARLY STATES SHE DOES NOT WANT CARDIAC [place] VENTILATION OR OTHER HEROIC OR FUTILE MEASURES.    Review of Systems: A 12 point ROS discussed and pertinent positives are indicated in the HPI above.  All other systems are negative.  Review of Systems  Constitutional: Negative.   Respiratory: Negative.   Cardiovascular: Negative.   Gastrointestinal: Negative.   Genitourinary: Negative.   Neurological: Negative.     Vital Signs: BP 115/49 mmHg  Pulse 82  Temp(Src) 97.8 F (36.6 C) (Oral)  Resp 15  SpO2 95%  Physical Exam    Constitutional: She is oriented to person, place, and time. No distress.  Abdominal: Soft. She exhibits no distension. There is no tenderness. There is no rebound and no guarding.  Neurological: She is alert and oriented to person, place, and time.  Skin: She is not diaphoretic.  Nursing note and vitals reviewed.   Imaging: Ct Abd Wo & W Cm  12/26/2014   CLINICAL DATA:  Renal cryoablation on 10/06/2014.  Prior appendectomy and hysterectomy. Renal mass.  EXAM: CT ABDOMEN WITHOUT AND WITH CONTRAST  TECHNIQUE: Multidetector CT imaging of the abdomen was performed following the standard protocol before and following the bolus administration of intravenous contrast.  CONTRAST:  68m OMNIPAQUE IOHEXOL 300 MG/ML  SOLN  COMPARISON:  Multiple exams, including 10/06/2014 and 07/14/2014  FINDINGS: Lower chest:  Coronary artery atherosclerotic calcification.  Hepatobiliary: Mild gallbladder wall thickening. Gallstone or polyp noted along the gallbladder fundus, image 37 series 604. Common bile duct normal in caliber for reach.  Pancreas: Unremarkable  Spleen: Faint hypodensity superiorly in the spleen is likely incidental.  Adrenals/Urinary Tract: 2.5 by 2.0 cm zone of hypodensity in the left mid kidney, potential faint enhancement along the periphery but the previous central enhancement of this lesion is no longer present. The long axis of this zone is oriented anterior -posterior; the prior 2.2 by 1.8 cm complex mass was oriented transversely. Density measurements 23 Hounsfield units precontrast; 34 Hounsfield units arterial phase; 36 Hounsfield units portal venous phase; 29 Hounsfield units delayed phase. This lesion is attended by a a calyx on image 48 of series 9.  Remaining bilateral renal cystic lesions appear benign are or although some are below too small to characterize. 0.7 by 0.4 cm left mid kidney nonobstructive calculus. Several clustered left kidney lower pole nonobstructive calculi.  Stomach/Bowel:  Unremarkable  Vascular/Lymphatic: Aortoiliac atherosclerotic vascular disease. Calcified 0.8 cm splenic artery aneurysm. No pathologic adenopathy observed.  Other: No supplemental non-categorized findings.  Musculoskeletal: Vertebral augmentation at L1 with chronic compression. Chronic endplate compressions at L4-5.  IMPRESSION: 1. Complex cystic lesion in the ablation zone, with suspected faint marginal enhancement accounting for the maximum 13 Hounsfield unit increase in density with contrast administration. The prior central enhancement of this lesion has is no longer visually apparent. The faint suspected marginal enhancement may well simply be inflammatory at this stage ; continued follow up imaging is suggested to assess for any recurrence particularly in light of the similar size between the transverse measurement of the ablation defect and of the original tumor. No positive identification of residual tumor currently. 2. Gallbladder wall thickening with stable gallstone or polyp along the gallbladder fundus. 3. Coronary atherosclerosis. Suspected small splenic artery aneurysm, 8 mm. 4. Chronic endplate compressions at L4-5. Chronic augmented compression fracture at L1. 5. Left nephrolithiasis.   Electronically Signed   By: WVan ClinesM.D.   On: 12/26/2014 08:46    Labs:  CBC:  Recent Labs  08/11/14 0925 09/29/14 1445 10/07/14 0421  WBC 8.8 7.1 7.6  HGB 14.9 13.0 11.3*  HCT 45.5 40.0 35.1*  PLT 317.0 252 215    COAGS:  Recent Labs  09/29/14 1445  INR 0.89  APTT 27    BMP:  Recent Labs  09/22/14 0848 09/29/14 1445 10/06/14 0943 10/07/14 0421 12/19/14 0859  NA 137 138 135 134*  --   K 3.9 3.9 3.3* 3.2*  --   CL 106 102 102 104  --   CO2 24 29 24 24   --   GLUCOSE 98 92 90 92  --   BUN 20 21 20 15 16   CALCIUM 9.1 9.4 8.6 7.9*  --   CREATININE 0.96 1.13* 1.04 0.89 0.96  GFRNONAA  --  43* 48* 58* 54*  GFRAA  --  50* 56* 67* 63    LIVER FUNCTION TESTS:  Recent  Labs  08/11/14 0925  BILITOT 0.5  AST 30  ALT 28  ALKPHOS 62  PROT  7.0  ALBUMIN 3.9    TUMOR MARKERS: No results for input(s): AFPTM, CEA, CA199, CHROMGRNA in the last 8760 hours.  Assessment and Plan:  I met with [name] and her daughter. We reviewed follow-up CT findings. There is suggestion of minimal, vague enhancement around the periphery of ablation which is likely related to post ablation changes/residual inflammation. No nodular enhancement is identified. There is no evidence of complication following the procedure. I recommended a follow-up CT in February of next year, one year status post ablation.  SignedAletta Edouard T 12/27/2014, 8:49 AM     I spent a total of 15 minutes face to face in clinical consultation, greater than 50% of which was counseling/coordinating care post ablation of right renal neoplasm.

## 2015-02-13 ENCOUNTER — Ambulatory Visit: Payer: Self-pay | Admitting: Family Medicine

## 2015-02-20 ENCOUNTER — Encounter: Payer: Self-pay | Admitting: Family Medicine

## 2015-02-22 ENCOUNTER — Other Ambulatory Visit: Payer: Self-pay | Admitting: Family Medicine

## 2015-02-22 DIAGNOSIS — E785 Hyperlipidemia, unspecified: Secondary | ICD-10-CM

## 2015-02-22 DIAGNOSIS — E876 Hypokalemia: Secondary | ICD-10-CM

## 2015-03-02 ENCOUNTER — Other Ambulatory Visit (INDEPENDENT_AMBULATORY_CARE_PROVIDER_SITE_OTHER): Payer: MEDICARE

## 2015-03-02 ENCOUNTER — Ambulatory Visit: Payer: MEDICARE | Admitting: Family Medicine

## 2015-03-02 DIAGNOSIS — E876 Hypokalemia: Secondary | ICD-10-CM

## 2015-03-02 DIAGNOSIS — E785 Hyperlipidemia, unspecified: Secondary | ICD-10-CM

## 2015-03-02 LAB — LIPID PANEL
CHOLESTEROL: 232 mg/dL — AB (ref 0–200)
HDL: 59.1 mg/dL (ref >39.00)
LDL Cholesterol: 151 mg/dL — ABNORMAL HIGH (ref 0–99)
NonHDL: 172.9
Total CHOL/HDL Ratio: 4
Triglycerides: 112 mg/dL (ref 0.0–149.0)
VLDL: 22.4 mg/dL (ref 0.0–40.0)

## 2015-03-02 LAB — HEPATIC FUNCTION PANEL
ALT: 25 U/L (ref 0–35)
AST: 29 U/L (ref 0–37)
Albumin: 3.5 g/dL (ref 3.5–5.2)
Alkaline Phosphatase: 60 U/L (ref 39–117)
BILIRUBIN DIRECT: 0.1 mg/dL (ref 0.0–0.3)
Total Bilirubin: 0.4 mg/dL (ref 0.2–1.2)
Total Protein: 6.1 g/dL (ref 6.0–8.3)

## 2015-03-02 LAB — BASIC METABOLIC PANEL
BUN: 13 mg/dL (ref 6–23)
CALCIUM: 9.4 mg/dL (ref 8.4–10.5)
CHLORIDE: 110 meq/L (ref 96–112)
CO2: 23 meq/L (ref 19–32)
Creatinine, Ser: 1.05 mg/dL (ref 0.40–1.20)
GFR: 52.96 mL/min — ABNORMAL LOW (ref >60.00)
GLUCOSE: 87 mg/dL (ref 70–99)
POTASSIUM: 4.1 meq/L (ref 3.5–5.1)
Sodium: 141 mEq/L (ref 135–145)

## 2015-03-16 ENCOUNTER — Telehealth: Payer: Self-pay | Admitting: Family Medicine

## 2015-03-16 DIAGNOSIS — M159 Polyosteoarthritis, unspecified: Secondary | ICD-10-CM

## 2015-03-16 DIAGNOSIS — F419 Anxiety disorder, unspecified: Secondary | ICD-10-CM

## 2015-03-16 DIAGNOSIS — H40013 Open angle with borderline findings, low risk, bilateral: Secondary | ICD-10-CM | POA: Diagnosis not present

## 2015-03-16 MED ORDER — TRAMADOL HCL 50 MG PO TABS: 50.0000 mg | ORAL_TABLET | Freq: Four times a day (QID) | ORAL | Status: DC | PRN

## 2015-03-16 MED ORDER — ALPRAZOLAM 0.5 MG PO TABS: ORAL_TABLET | ORAL | Status: DC

## 2015-03-16 NOTE — Telephone Encounter (Signed)
Caller name: Tailor Relation to pt: Call back number: [phone] Pharmacy: [facility].  Reason for call:   Patient is requesting refills on tramadol and alrprazolam with one extra refill

## 2015-03-16 NOTE — Telephone Encounter (Signed)
Refill x1 

## 2015-03-16 NOTE — Telephone Encounter (Signed)
Rx faxed.    KP 

## 2015-03-16 NOTE — Telephone Encounter (Signed)
Last seen 08/01/14 and filled 08/11/14  Tramadol #270 with 3 ref Xanax #180 with 3 ref   Please advise    KP

## 2015-05-04 ENCOUNTER — Emergency Department (HOSPITAL_BASED_OUTPATIENT_CLINIC_OR_DEPARTMENT_OTHER)
Admission: EM | Admit: 2015-05-04 | Discharge: 2015-05-04 | Disposition: A | Discharge status: Home / Self Care | Payer: MEDICARE | Attending: Emergency Medicine | Admitting: Emergency Medicine

## 2015-05-04 ENCOUNTER — Emergency Department (HOSPITAL_BASED_OUTPATIENT_CLINIC_OR_DEPARTMENT_OTHER): Payer: MEDICARE

## 2015-05-04 ENCOUNTER — Encounter (HOSPITAL_BASED_OUTPATIENT_CLINIC_OR_DEPARTMENT_OTHER): Payer: Self-pay | Admitting: *Deleted

## 2015-05-04 DIAGNOSIS — Y9389 Activity, other specified: Secondary | ICD-10-CM | POA: Diagnosis not present

## 2015-05-04 DIAGNOSIS — Z85528 Personal history of other malignant neoplasm of kidney: Secondary | ICD-10-CM | POA: Diagnosis not present

## 2015-05-04 DIAGNOSIS — K219 Gastro-esophageal reflux disease without esophagitis: Secondary | ICD-10-CM | POA: Insufficient documentation

## 2015-05-04 DIAGNOSIS — H9190 Unspecified hearing loss, unspecified ear: Secondary | ICD-10-CM | POA: Insufficient documentation

## 2015-05-04 DIAGNOSIS — S32010A Wedge compression fracture of first lumbar vertebra, initial encounter for closed fracture: Secondary | ICD-10-CM | POA: Diagnosis not present

## 2015-05-04 DIAGNOSIS — S32511A Fracture of superior rim of right pubis, initial encounter for closed fracture: Secondary | ICD-10-CM | POA: Diagnosis not present

## 2015-05-04 DIAGNOSIS — Z79899 Other long term (current) drug therapy: Secondary | ICD-10-CM | POA: Insufficient documentation

## 2015-05-04 DIAGNOSIS — W01198A Fall on same level from slipping, tripping and stumbling with subsequent striking against other object, initial encounter: Secondary | ICD-10-CM | POA: Diagnosis not present

## 2015-05-04 DIAGNOSIS — F419 Anxiety disorder, unspecified: Secondary | ICD-10-CM | POA: Diagnosis not present

## 2015-05-04 DIAGNOSIS — E039 Hypothyroidism, unspecified: Secondary | ICD-10-CM | POA: Insufficient documentation

## 2015-05-04 DIAGNOSIS — Z7982 Long term (current) use of aspirin: Secondary | ICD-10-CM | POA: Insufficient documentation

## 2015-05-04 DIAGNOSIS — Y998 Other external cause status: Secondary | ICD-10-CM | POA: Diagnosis not present

## 2015-05-04 DIAGNOSIS — F329 Major depressive disorder, single episode, unspecified: Secondary | ICD-10-CM | POA: Insufficient documentation

## 2015-05-04 DIAGNOSIS — S20221A Contusion of right back wall of thorax, initial encounter: Secondary | ICD-10-CM | POA: Diagnosis not present

## 2015-05-04 DIAGNOSIS — S32591A Other specified fracture of right pubis, initial encounter for closed fracture: Secondary | ICD-10-CM | POA: Insufficient documentation

## 2015-05-04 DIAGNOSIS — R0781 Pleurodynia: Secondary | ICD-10-CM | POA: Diagnosis not present

## 2015-05-04 DIAGNOSIS — D649 Anemia, unspecified: Secondary | ICD-10-CM | POA: Diagnosis not present

## 2015-05-04 DIAGNOSIS — Z87891 Personal history of nicotine dependence: Secondary | ICD-10-CM | POA: Diagnosis not present

## 2015-05-04 DIAGNOSIS — Z8739 Personal history of other diseases of the musculoskeletal system and connective tissue: Secondary | ICD-10-CM | POA: Insufficient documentation

## 2015-05-04 DIAGNOSIS — S3991XA Unspecified injury of abdomen, initial encounter: Secondary | ICD-10-CM | POA: Diagnosis present

## 2015-05-04 DIAGNOSIS — Y9289 Other specified places as the place of occurrence of the external cause: Secondary | ICD-10-CM | POA: Diagnosis not present

## 2015-05-04 DIAGNOSIS — I252 Old myocardial infarction: Secondary | ICD-10-CM | POA: Diagnosis not present

## 2015-05-04 MED ORDER — HYDROCODONE-ACETAMINOPHEN 5-325 MG PO TABS
1.0000 | ORAL_TABLET | Freq: Once | ORAL | Status: AC
  Administered 2015-05-04: 1 via ORAL
  Filled 2015-05-04: qty 1

## 2015-05-04 MED ORDER — HYDROCODONE-ACETAMINOPHEN 5-325 MG PO TABS: 1.0000 | ORAL_TABLET | Freq: Four times a day (QID) | ORAL | Status: DC | PRN

## 2015-05-04 NOTE — ED Provider Notes (Signed)
CSN: [id number]     Arrival date & time 05/04/15  1808 History   First MD Initiated Contact with Patient 05/04/15 1831     Chief Complaint  Patient presents with  . Fall     (Consider location/radiation/quality/duration/timing/severity/associated sxs/prior Treatment) HPI Patient presents to the emergency department with back pain and groin pain from a fall that occurred earlier this morning.  The patient states that she fell while trying to put on her pants this morning.  She states she struck her posterior ribs and landed on her right buttock region.  The patient states that she is having pain in the right groin area as well.  She states that she can ambulate but it is painful.  Patient, states she has not had any nausea, vomiting, abdominal pain, weakness, dizziness, headache, blurred vision, neck pain, numbness, chest pain, shortness of breath or syncope.  The patient states that she took a tramadol around 3 PM for her pain Past Medical History  Diagnosis Date  . Anxiety   . Depression   . Hyperlipidemia   . Anemia   . Cataract 05/2011  . Thyroid disease     hypothyroidism  . Allergy   . GERD (gastroesophageal reflux disease)   . Myocardial infarction   . Osteoporosis   . Esophageal yeast infection   . Esophageal disorder   . Hypothyroidism   . Transfusion history     last 2004  . Chronic low blood pressure     due to dehydration  . Hearing loss     hearing aids  . Renal cell carcinoma     found on CT   Past Surgical History  Procedure Laterality Date  . Obstructed ureter      operative repair  . Abdominal hysterectomy    . Total abdominal hysterectomy w/ bilateral salpingoophorectomy    . Kyphosis surgery    . Basal cell carcinoma excision    . Eye surgery  05/2011  . Tonsillectomy    . Appendectomy    . Colonoscopy    . Coronary artery bypass graft      x2 vessel bypass -Dr. Burt Knack -[facility] 06-22-14   Family History  Problem Relation Age of Onset  .  Parkinsonism Father   . Cancer Other     breast 1st degree relative  . Hypertension Other   . Heart disease Other     CAD.Marland Kitchen1ST DEGREE RELATIVE <60  . Hyperlipidemia Other   . Colon cancer Neg Hx   . Esophageal cancer Neg Hx   . Rectal cancer Neg Hx   . Stomach cancer Neg Hx    Social History  Substance Use Topics  . Smoking status: Former Research scientist (life sciences)  . Smokeless tobacco: Never Used  . Alcohol Use: No   OB History    No data available     Review of Systems  All other systems negative except as documented in the HPI. All pertinent positives and negatives as reviewed in the HPI.  Allergies  Zofran; Clarithromycin; Codeine; Diazepam; Sulfonamide derivatives; and Prilosec  Home Medications   Prior to Admission medications   Medication Sig Start Date End Date Taking? Authorizing Provider  ALPRAZolam Duanne Moron) 0.5 MG tablet 1 po bid prn 03/16/15   Rosalita Chessman, DO  aspirin 81 MG EC tablet Take 81 mg by mouth daily.      Historical Provider, MD  Cholecalciferol (VITAMIN D3) 1000 UNITS CAPS Take by mouth daily.      Historical Provider, MD  ferrous fumarate (FERRETTS) 325 (106 FE) MG TABS Take by mouth.      Historical Provider, MD  fluconazole (DIFLUCAN) 100 MG tablet Take 1 tablet (100 mg total) by mouth daily. 03/24/14   Milus Banister, MD  fluticasone [facility]) 50 MCG/ACT nasal spray Place 2 sprays into both nostrils daily. Patient taking differently: Place 2 sprays into both nostrils daily as needed for allergies.  09/07/13   Rosalita Chessman, DO  folic acid (FOLVITE) 1 MG tablet Take 1 mg by mouth daily.      Historical Provider, MD  furosemide (LASIX) 40 MG tablet Take 1 tablet (40 mg total) by mouth daily as needed. 08/11/14   Rosalita Chessman, DO  levothyroxine (SYNTHROID, LEVOTHROID) 25 MCG tablet Take 1 tablet by mouth  daily Patient not taking: Reported on 09/27/2014 08/11/14   Rosalita Chessman, DO  levothyroxine (SYNTHROID, LEVOTHROID) 25 MCG tablet Take 25 mcg by mouth daily before  breakfast.    Historical Provider, MD  loratadine-pseudoephedrine (CLARITIN-D 24-HOUR) 10-240 MG per 24 hr tablet Take 1 tablet by mouth daily as needed for allergies.    Historical Provider, MD  metoCLOPramide (REGLAN) 10 MG tablet Take 1 tablet (10 mg total) by mouth at bedtime. 11/23/12   Milus Banister, MD  Multiple Vitamin (MULTIVITAMIN WITH MINERALS) TABS tablet Take 1 tablet by mouth daily.    Historical Provider, MD  nystatin (MYCOSTATIN) 100000 UNIT/ML suspension Take 10 mLs (1,000,000 Units total) by mouth 4 (four) times daily. Patient taking differently: Take 10 mLs by mouth See admin instructions. Pt soaks dentures in once every 10 days 03/24/14   Milus Banister, MD  potassium chloride SA (K-DUR,KLOR-CON) 20 MEQ tablet Take 1 tablet (20 mEq total) by mouth 2 (two) times daily. 08/11/14   Rosalita Chessman, DO  promethazine (PHENERGAN) 25 MG tablet Take 1 tablet by mouth two  times daily 08/11/14   Rosalita Chessman, DO  ranitidine (ZANTAC) 150 MG tablet Take 150 mg by mouth 2 (two) times daily.    Historical Provider, MD  rOPINIRole (REQUIP) 1 MG tablet Take 1 tablet (1 mg total) by mouth at bedtime. 08/11/14   Rosalita Chessman, DO  tiZANidine (ZANAFLEX) 2 MG tablet Take 1 tablet (2 mg total) by mouth 3 (three) times daily. Patient taking differently: Take 2 mg by mouth 3 (three) times daily as needed for muscle spasms.  05/31/12   Rosalita Chessman, DO  traMADol (ULTRAM) 50 MG tablet Take 1 tablet (50 mg total) by mouth every 6 (six) hours as needed. 03/16/15   Alferd Apa Lowne, DO   BP 116/61 mmHg  Pulse 97  Temp(Src) 98.4 F (36.9 C) (Oral)  Resp 18  Ht 5' 3.5" (1.613 m)  Wt 90 lb (40.824 kg)  BMI 15.69 kg/m2  SpO2 100% Physical Exam  Constitutional: She is oriented to person, place, and time. She appears well-developed and well-nourished.  HENT:  Head: Normocephalic and atraumatic.  Mouth/Throat: Oropharynx is clear and moist.  Eyes: Pupils are equal, round, and reactive to light.  Neck:  Normal range of motion. Neck supple.  Cardiovascular: Normal rate, regular rhythm and normal heart sounds.  Exam reveals no gallop and no friction rub.   No murmur heard. Pulmonary/Chest: Effort normal and breath sounds normal. No respiratory distress.  Abdominal: Soft. Bowel sounds are normal. She exhibits no distension. There is no tenderness.  Musculoskeletal:       Back:       Legs: Neurological:  She is alert and oriented to person, place, and time. She exhibits normal muscle tone. Coordination normal.  Skin: Skin is warm and dry. No rash noted. No erythema.    ED Course  Procedures (including critical care time)  Imaging Review Dg Ribs Unilateral W/chest Right  05/04/2015   CLINICAL DATA:  Acute right rib pain after fall today.  EXAM: RIGHT RIBS AND CHEST - 3+ VIEW  COMPARISON:  October 06, 2014.  FINDINGS: No fracture or other bone lesions are seen involving the ribs. There is no evidence of pneumothorax or pleural effusion. Both lungs are clear. Heart size and mediastinal contours are within normal limits.  IMPRESSION: Normal right ribs.  No acute cardiopulmonary abnormality seen.   Electronically Signed   By: Marijo Conception, M.D.   On: 05/04/2015 20:25   Dg Thoracic Spine 2 View  05/04/2015   CLINICAL DATA:  81 year old female with fall  EXAM: THORACIC SPINE 2 VIEWS  COMPARISON:  Radiograph dated 10/06/2014 abdominal CT dated 12/26/2014  FINDINGS: There is advanced osteopenia. No acute fracture identified. There are multilevel degenerative changes of the spine. There is compression fracture of the L1 vertebra with loss of vertebral body height and vertebroplasty changes similar to prior study. Median sternotomy wires noted.  IMPRESSION: No acute fracture.  Osteopenia with chronic L1 compression fracture.   Electronically Signed   By: Anner Crete M.D.   On: 05/04/2015 20:24   Dg Hip Unilat With Pelvis 2-3 Views Right  05/04/2015   CLINICAL DATA:  81 year old female status post fall  right hip pain.  EXAM: DG HIP (WITH OR WITHOUT PELVIS) 2-3V RIGHT  COMPARISON:  CT of the abdomen pelvis dated 04/10/2014  FINDINGS: There are fractures of the right inferior and superior pubic rami. There is no fracture of the femur. No dislocation.  IMPRESSION: Fractures of the right pubic bone. No fracture or dislocation of the right femur.   Electronically Signed   By: Anner Crete M.D.   On: 05/04/2015 20:20   I have personally reviewed and evaluated these images and lab results as part of my medical decision-making. Patient will be treated for her pubic rami fracture. The patient can ambulate and does have a walker. She will be asked to follow up with ortho.  Patient agrees the plan.  She would like to go home and did get significant pain relief with the hydrocodone   Dalia Heading, PA-C 05/04/15 2130  Alfonzo Beers, MD 05/04/15 2142  Alfonzo Beers, MD 05/04/15 2144

## 2015-05-04 NOTE — Discharge Instructions (Signed)
Return here as needed.  Follow-up with your orthopedist or the one provided.  He will need to use the walker when ambulating.  You will also need to be on relative bed rest or sitting in a chair

## 2015-05-04 NOTE — ED Notes (Signed)
Lost her balance and fell earlier today. Injury to her mid back and groin. She put a back brace on.

## 2015-05-09 DIAGNOSIS — S2231XA Fracture of one rib, right side, initial encounter for closed fracture: Secondary | ICD-10-CM | POA: Diagnosis not present

## 2015-05-09 DIAGNOSIS — S329XXA Fracture of unspecified parts of lumbosacral spine and pelvis, initial encounter for closed fracture: Secondary | ICD-10-CM | POA: Diagnosis not present

## 2015-07-04 ENCOUNTER — Other Ambulatory Visit: Payer: Self-pay | Admitting: Family Medicine

## 2015-07-04 DIAGNOSIS — G2581 Restless legs syndrome: Secondary | ICD-10-CM

## 2015-07-04 DIAGNOSIS — I10 Essential (primary) hypertension: Secondary | ICD-10-CM

## 2015-07-04 DIAGNOSIS — F419 Anxiety disorder, unspecified: Secondary | ICD-10-CM

## 2015-07-04 DIAGNOSIS — M159 Polyosteoarthritis, unspecified: Secondary | ICD-10-CM

## 2015-07-04 DIAGNOSIS — R110 Nausea: Secondary | ICD-10-CM

## 2015-07-04 MED ORDER — ALPRAZOLAM 0.5 MG PO TABS: ORAL_TABLET | ORAL | Status: DC

## 2015-07-04 MED ORDER — TRAMADOL HCL 50 MG PO TABS: 50.0000 mg | ORAL_TABLET | Freq: Four times a day (QID) | ORAL | Status: DC | PRN

## 2015-07-04 MED ORDER — ROPINIROLE HCL 1 MG PO TABS
1.0000 mg | ORAL_TABLET | Freq: Every day | ORAL | Status: DC
Start: 2015-07-04 — End: 2016-04-28

## 2015-07-04 MED ORDER — FUROSEMIDE 40 MG PO TABS: ORAL_TABLET | ORAL | Status: DC

## 2015-07-04 MED ORDER — POTASSIUM CHLORIDE CRYS ER 20 MEQ PO TBCR: 20.0000 meq | EXTENDED_RELEASE_TABLET | Freq: Two times a day (BID) | ORAL | Status: DC

## 2015-07-04 MED ORDER — METOCLOPRAMIDE HCL 10 MG PO TABS: 10.0000 mg | ORAL_TABLET | Freq: Every day | ORAL | Status: DC

## 2015-07-04 NOTE — Addendum Note (Signed)
Addended by: Ewing Schlein on: 07/04/2015 11:24 AM   Modules accepted: Orders

## 2015-07-04 NOTE — Telephone Encounter (Signed)
Caller name: Ocia  Relationship to patient: Self  Can be reached: [phone]  Pharmacy:  [place], [place]  Reason for call: Pt called in to get a few medications refilled. furosemide, metoCLOPramide, Alprazolam, traMADol, rOPINIRole, potassium chloride

## 2015-07-04 NOTE — Telephone Encounter (Addendum)
Last seen 08/04/14 and apt scheduled for 09/27/15 Labs completed 03/02/15 and WNL. Please advise      KP

## 2015-07-05 MED ORDER — ALPRAZOLAM 0.5 MG PO TABS: ORAL_TABLET | ORAL | Status: DC

## 2015-07-05 MED ORDER — TRAMADOL HCL 50 MG PO TABS: 50.0000 mg | ORAL_TABLET | Freq: Four times a day (QID) | ORAL | Status: DC | PRN

## 2015-07-05 NOTE — Addendum Note (Signed)
Addended by: Ewing Schlein on: 07/05/2015 09:15 AM   Modules accepted: Orders

## 2015-07-05 NOTE — Telephone Encounter (Signed)
Rx faxed.    KP 

## 2015-07-09 ENCOUNTER — Encounter: Payer: Self-pay | Admitting: Cardiovascular Disease

## 2015-07-09 ENCOUNTER — Ambulatory Visit (INDEPENDENT_AMBULATORY_CARE_PROVIDER_SITE_OTHER): Payer: MEDICARE | Admitting: Cardiovascular Disease

## 2015-07-09 VITALS — BP 98/60 | HR 84 | Ht 63.0 in | Wt 84.8 lb

## 2015-07-09 DIAGNOSIS — I2510 Atherosclerotic heart disease of native coronary artery without angina pectoris: Secondary | ICD-10-CM | POA: Diagnosis not present

## 2015-07-09 DIAGNOSIS — E785 Hyperlipidemia, unspecified: Secondary | ICD-10-CM

## 2015-07-09 MED ORDER — SIMVASTATIN 20 MG PO TABS: ORAL_TABLET | ORAL | Status: DC

## 2015-07-09 NOTE — Progress Notes (Signed)
Cardiology Office Note Date:  07/09/2015   ID:  KENNETHA LUDWIG, DOB 01-02-1928, MRN [id number]  PCP:  Garnet Koyanagi, DO  Cardiologist:  Sherren Mocha, MD    Chief Complaint  Patient presents with  . Coronary Artery Disease    History of Present Illness: [name] is a 81 y.o. female who presents for follow-up of coronary artery disease. The patient underwent multivessel CABG in 2004. Last echocardiogram in 2015 showed normal left ventricular size and function with an estimated ejection fraction of 50-55%. When she was seen last year, her statin drug was discontinued because of advanced age and protein calorie malnutrition with a BMI of only 15.  Since her last visit here one year ago, the patient has undergone renal cryoablation. She did well with this and had no complications. She also sustained a pelvic fracture after a fall. This was difficult for her and she had a prolonged recovery with an extended period of pain and disability. She is now back on her feet. She denies any cardiac problems. She specifically denies chest pain, chest pressure, shortness of breath, or heart palpitations. She denies leg swelling, orthopnea, or PND  Past Medical History  Diagnosis Date  . Anxiety   . Depression   . Hyperlipidemia   . Anemia   . Cataract 05/2011  . Thyroid disease     hypothyroidism  . Allergy   . GERD (gastroesophageal reflux disease)   . Myocardial infarction ([place])   . Osteoporosis   . Esophageal yeast infection ([place])   . Esophageal disorder   . Hypothyroidism   . Transfusion history     last 2004  . Chronic low blood pressure     due to dehydration  . Hearing loss     hearing aids  . Renal cell carcinoma ([place])     found on CT    Past Surgical History  Procedure Laterality Date  . Obstructed ureter      operative repair  . Abdominal hysterectomy    . Total abdominal hysterectomy w/ bilateral salpingoophorectomy    . Kyphosis surgery    . Basal cell carcinoma  excision    . Eye surgery  05/2011  . Tonsillectomy    . Appendectomy    . Colonoscopy    . Coronary artery bypass graft      x2 vessel bypass -Dr. Burt Knack -[facility] [place] 06-22-14    Current Outpatient Prescriptions  Medication Sig Dispense Refill  . ALPRAZolam (XANAX) 0.5 MG tablet 1 po bid prn 180 tablet 0  . aspirin 81 MG EC tablet Take 81 mg by mouth daily.      . Cholecalciferol (VITAMIN D3) 1000 UNITS CAPS Take by mouth daily.      . ferrous fumarate (FERRETTS) 325 (106 FE) MG TABS Take 1 tablet by mouth daily.     . fluconazole (DIFLUCAN) 100 MG tablet Take 1 tablet (100 mg total) by mouth daily. 90 tablet 3  . fluticasone (FLONASE) 50 MCG/ACT nasal spray Place 2 sprays into both nostrils daily. (Patient taking differently: Place 2 sprays into both nostrils daily as needed for allergies. ) 16 g 6  . folic acid (FOLVITE) 1 MG tablet Take 1 mg by mouth daily.      . furosemide (LASIX) 40 MG tablet Take 1 tablet (40 mg total) by mouth daily as needed. 90 tablet 3  . levothyroxine (SYNTHROID, LEVOTHROID) 25 MCG tablet Take 1 tablet by mouth  daily 90 tablet 3  .  levothyroxine (SYNTHROID, LEVOTHROID) 25 MCG tablet Take 25 mcg by mouth daily before breakfast.    . loratadine-pseudoephedrine (CLARITIN-D 24-HOUR) 10-240 MG per 24 hr tablet Take 1 tablet by mouth daily as needed for allergies.    Marland Kitchen metoCLOPramide (REGLAN) 10 MG tablet Take 1 tablet (10 mg total) by mouth at bedtime. 270 tablet 3  . Multiple Vitamin (MULTIVITAMIN WITH MINERALS) TABS tablet Take 1 tablet by mouth daily.    Marland Kitchen nystatin (MYCOSTATIN) 100000 UNIT/ML suspension Take 10 mLs (1,000,000 Units total) by mouth 4 (four) times daily. (Patient taking differently: Take 10 mLs by mouth See admin instructions. Pt soaks dentures in once every 10 days) 240 mL 6  . potassium chloride SA (K-DUR,KLOR-CON) 20 MEQ tablet Take 1 tablet (20 mEq total) by mouth 2 (two) times daily. 180 tablet 3  . promethazine (PHENERGAN) 25 MG tablet Take  1 tablet by mouth two  times daily 180 tablet 3  . ranitidine (ZANTAC) 150 MG tablet Take 150 mg by mouth 2 (two) times daily.    Marland Kitchen rOPINIRole (REQUIP) 1 MG tablet Take 1 tablet (1 mg total) by mouth at bedtime. 90 tablet 3  . tiZANidine (ZANAFLEX) 2 MG tablet Take by mouth every 3 (three) hours as needed for muscle spasms.     . traMADol (ULTRAM) 50 MG tablet Take 1 tablet (50 mg total) by mouth every 6 (six) hours as needed. 270 tablet 0  . simvastatin (ZOCOR) 20 MG tablet Take one tablet by mouth three times per week 90 tablet 3   No current facility-administered medications for this visit.    Allergies:   Zofran; Clarithromycin; Codeine; Diazepam; Sulfonamide derivatives; and Prilosec   Social History:  The patient  reports that she has quit smoking. She has never used smokeless tobacco. She reports that she does not drink alcohol or use illicit drugs.   Family History:  The patient's family history includes Cancer in her other; Heart disease in her other; Hyperlipidemia in her other; Hypertension in her other; Parkinsonism in her father. There is no history of Colon cancer, Esophageal cancer, Rectal cancer, or Stomach cancer.   ROS:  Please see the history of present illness.  Otherwise, review of systems is positive for .  All other systems are reviewed and negative.   PHYSICAL EXAM: VS:  BP 98/60 mmHg  Pulse 84  Ht 5\' 3"  (1.6 m)  Wt 84 lb 12.8 oz (38.465 kg)  BMI 15.03 kg/m2 , BMI Body mass index is 15.03 kg/(m^2). GEN:  Pleasant, thin elderly woman, in no acute distress HEENT: normal Neck: no JVD, no masses. No carotid bruits Cardiac: RRR without murmur or gallop                Respiratory:  clear to auscultation bilaterally, normal work of breathing GI: soft, nontender, nondistended, + BS MS: no deformity or atrophy Ext: no pretibial edema, pedal pulses 2+= bilaterally Skin: warm and dry, no rash Neuro:  Strength and sensation are intact Psych: euthymic mood, full  affect  EKG:  EKG is ordered today. The ekg ordered today shows Sinus rhythm with PACs, heart rate 84 bpm, otherwise within normal limits.  Recent Labs: 08/11/2014: TSH 2.805 09/22/2014: Magnesium 2.0 10/07/2014: Hemoglobin 11.3*; Platelets 215 03/02/2015: ALT 25; BUN 13; Creatinine, Ser 1.05; Potassium 4.1; Sodium 141   Lipid Panel     Component Value Date/Time   CHOL 232* 03/02/2015 0732   TRIG 112.0 03/02/2015 0732   HDL 59.10 03/02/2015 0732  CHOLHDL 4 03/02/2015 0732   VLDL 22.4 03/02/2015 0732   LDLCALC 151* 03/02/2015 0732   LDLDIRECT 165.1 05/10/2008 0000      Wt Readings from Last 3 Encounters:  07/09/15 84 lb 12.8 oz (38.465 kg)  05/04/15 90 lb (40.824 kg)  10/06/14 92 lb (41.731 kg)     ASSESSMENT AND PLAN: 1.   CAD, native vessel, without symptoms of angina: Patient is stable on her current medical program. She takes aspirin 81 mg daily. Her blood pressure is low and she will not tolerate a beta blocker. She will continue with observation and I will see her back in one year.  2. Hyperlipidemia: Simvastatin was discontinued last year because of advanced age, frailty, and protein calorie malnutrition. Her lipids were followed and total cholesterol increased to well over 200 and LDL is 151. We reviewed treatment considerations in the context of known CAD and prior CABG and opted to resume simvastatin 20 mg 3 days per week. She has follow-up labs scheduled in February with Dr Etter Sjogren.  Current medicines are reviewed with the patient today.  The patient does not have concerns regarding medicines.  Labs/ tests ordered today include:   Orders Placed This Encounter  Procedures  . EKG 12-Lead    Disposition:   FU one year  Signed, Sherren Mocha, MD  07/09/2015 10:48 AM    [facility] [place], [place], [place]  [address] Phone: [phone]; Fax: [phone]

## 2015-07-09 NOTE — Patient Instructions (Signed)
Medication Instructions:  Your physician has recommended you make the following change in your medication:  1. START Simvastatin 20mg  take one tablet by mouth three times per week  Labwork: No new orders.   Testing/Procedures: No new orders.   Follow-Up: Your physician wants you to follow-up in: 1 YEAR with Dr Burt Knack.  You will receive a reminder letter in the mail two months in advance. If you don't receive a letter, please call our office to schedule the follow-up appointment.   Any Other Special Instructions Will Be Listed Below (If Applicable).     If you need a refill on your cardiac medications before your next appointment, please call your pharmacy.

## 2015-07-10 ENCOUNTER — Other Ambulatory Visit: Payer: Self-pay

## 2015-07-10 DIAGNOSIS — I2510 Atherosclerotic heart disease of native coronary artery without angina pectoris: Secondary | ICD-10-CM

## 2015-07-10 DIAGNOSIS — E785 Hyperlipidemia, unspecified: Secondary | ICD-10-CM

## 2015-07-10 MED ORDER — SIMVASTATIN 20 MG PO TABS: ORAL_TABLET | ORAL | Status: DC

## 2015-08-22 ENCOUNTER — Encounter: Payer: Self-pay | Admitting: *Deleted

## 2015-09-20 ENCOUNTER — Other Ambulatory Visit (HOSPITAL_COMMUNITY): Payer: Self-pay | Admitting: Interventional Radiology

## 2015-09-20 DIAGNOSIS — N2889 Other specified disorders of kidney and ureter: Secondary | ICD-10-CM

## 2015-09-26 ENCOUNTER — Telehealth: Payer: Self-pay | Admitting: *Deleted

## 2015-09-26 ENCOUNTER — Encounter: Payer: Self-pay | Admitting: *Deleted

## 2015-09-26 NOTE — Telephone Encounter (Signed)
Pre-Visit Call completed with patient and chart updated.   Pre-Visit Info documented in Specialty Comments under [employer].    

## 2015-09-27 ENCOUNTER — Ambulatory Visit (INDEPENDENT_AMBULATORY_CARE_PROVIDER_SITE_OTHER): Payer: MEDICARE | Admitting: Family Medicine

## 2015-09-27 ENCOUNTER — Encounter: Payer: Self-pay | Admitting: Family Medicine

## 2015-09-27 VITALS — BP 96/65 | HR 89 | Temp 98.1°F | Ht 63.0 in | Wt 83.6 lb

## 2015-09-27 DIAGNOSIS — F419 Anxiety disorder, unspecified: Secondary | ICD-10-CM

## 2015-09-27 DIAGNOSIS — R110 Nausea: Secondary | ICD-10-CM

## 2015-09-27 DIAGNOSIS — B3781 Candidal esophagitis: Secondary | ICD-10-CM

## 2015-09-27 DIAGNOSIS — Z0000 Encounter for general adult medical examination without abnormal findings: Secondary | ICD-10-CM | POA: Diagnosis not present

## 2015-09-27 DIAGNOSIS — E785 Hyperlipidemia, unspecified: Secondary | ICD-10-CM | POA: Diagnosis not present

## 2015-09-27 DIAGNOSIS — E039 Hypothyroidism, unspecified: Secondary | ICD-10-CM | POA: Diagnosis not present

## 2015-09-27 DIAGNOSIS — M810 Age-related osteoporosis without current pathological fracture: Secondary | ICD-10-CM

## 2015-09-27 DIAGNOSIS — M159 Polyosteoarthritis, unspecified: Secondary | ICD-10-CM | POA: Diagnosis not present

## 2015-09-27 DIAGNOSIS — I10 Essential (primary) hypertension: Secondary | ICD-10-CM

## 2015-09-27 DIAGNOSIS — B370 Candidal stomatitis: Secondary | ICD-10-CM

## 2015-09-27 LAB — COMPREHENSIVE METABOLIC PANEL
ALBUMIN: 3.8 g/dL (ref 3.5–5.2)
ALK PHOS: 59 U/L (ref 39–117)
ALT: 25 U/L (ref 0–35)
AST: 28 U/L (ref 0–37)
BUN: 15 mg/dL (ref 6–23)
CHLORIDE: 105 meq/L (ref 96–112)
CO2: 25 mEq/L (ref 19–32)
Calcium: 9 mg/dL (ref 8.4–10.5)
Creatinine, Ser: 1.41 mg/dL — ABNORMAL HIGH (ref 0.40–1.20)
GFR: 37.64 mL/min — AB (ref >60.00)
GLUCOSE: 85 mg/dL (ref 70–99)
POTASSIUM: 3.2 meq/L — AB (ref 3.5–5.1)
SODIUM: 142 meq/L (ref 135–145)
TOTAL PROTEIN: 6.5 g/dL (ref 6.0–8.3)
Total Bilirubin: 0.4 mg/dL (ref 0.2–1.2)

## 2015-09-27 LAB — CBC WITH DIFFERENTIAL/PLATELET
BASOS PCT: 0.6 % (ref 0.0–3.0)
Basophils Absolute: 0 10*3/uL (ref 0.0–0.1)
EOS PCT: 0.3 % (ref 0.0–5.0)
Eosinophils Absolute: 0 10*3/uL (ref 0.0–0.7)
HCT: 42.7 % (ref 36.0–46.0)
HEMOGLOBIN: 13.8 g/dL (ref 12.0–15.0)
Lymphocytes Relative: 25.3 % (ref 12.0–46.0)
Lymphs Abs: 1.9 10*3/uL (ref 0.7–4.0)
MCHC: 32.4 g/dL (ref 30.0–36.0)
MCV: 97.3 fl (ref 78.0–100.0)
MONOS PCT: 8.1 % (ref 3.0–12.0)
Monocytes Absolute: 0.6 10*3/uL (ref 0.1–1.0)
Neutro Abs: 5 10*3/uL (ref 1.4–7.7)
Neutrophils Relative %: 65.7 % (ref 43.0–77.0)
Platelets: 306 10*3/uL (ref 150.0–400.0)
RBC: 4.38 Mil/uL (ref 3.87–5.11)
RDW: 14 % (ref 11.5–15.5)
WBC: 7.7 10*3/uL (ref 4.0–10.5)

## 2015-09-27 LAB — LIPID PANEL
CHOL/HDL RATIO: 3
Cholesterol: 206 mg/dL — ABNORMAL HIGH (ref 0–200)
HDL: 72.3 mg/dL (ref >39.00)
LDL CALC: 116 mg/dL — AB (ref 0–99)
NonHDL: 133.91
Triglycerides: 92 mg/dL (ref 0.0–149.0)
VLDL: 18.4 mg/dL (ref 0.0–40.0)

## 2015-09-27 LAB — TSH: TSH: 3.41 u[IU]/mL (ref 0.35–4.50)

## 2015-09-27 MED ORDER — FLUCONAZOLE 100 MG PO TABS: 100.0000 mg | ORAL_TABLET | Freq: Every day | ORAL | Status: DC

## 2015-09-27 MED ORDER — LEVOTHYROXINE SODIUM 25 MCG PO TABS: 25.0000 ug | ORAL_TABLET | Freq: Every day | ORAL | Status: DC

## 2015-09-27 MED ORDER — ALPRAZOLAM 0.5 MG PO TABS: ORAL_TABLET | ORAL | Status: DC

## 2015-09-27 MED ORDER — TRAMADOL HCL 50 MG PO TABS: 50.0000 mg | ORAL_TABLET | Freq: Four times a day (QID) | ORAL | Status: DC | PRN

## 2015-09-27 MED ORDER — PROMETHAZINE HCL 25 MG PO TABS: ORAL_TABLET | ORAL | Status: DC

## 2015-09-27 NOTE — Progress Notes (Signed)
Subjective:   [name] is a 82 y.o. female who presents for Medicare Annual (Subsequent) preventive examination.  Review of Systems:   Review of Systems  Constitutional: Negative for activity change, appetite change and fatigue.  HENT: Negative for hearing loss, congestion, tinnitus and ear discharge.   Eyes: Negative for visual disturbance (see optho q1y -- vision corrected to 20/20 with glasses).  Respiratory: Negative for cough, chest tightness and shortness of breath.   Cardiovascular: Negative for chest pain, palpitations and leg swelling.  Gastrointestinal: Negative for abdominal pain, diarrhea, constipation and abdominal distention.  Genitourinary: Negative for urgency, frequency, decreased urine volume and difficulty urinating.  Musculoskeletal: Negative for back pain, arthralgias and gait problem.  Skin: Negative for color change, pallor and rash.  Neurological: Negative for dizziness, light-headedness, numbness and headaches.  Hematological: Negative for adenopathy. Does not bruise/bleed easily.  Psychiatric/Behavioral: Negative for suicidal ideas, confusion, sleep disturbance, self-injury, dysphoric mood, decreased concentration and agitation.  Pt is able to read and write and can do all ADLs No risk for falling No abuse/ violence in home           Objective:     Vitals: BP 96/65 mmHg  Pulse 89  Temp(Src) 98.1 F (36.7 C) (Oral)  Ht 5' 3"  (1.6 m)  Wt 83 lb 9.6 oz (37.921 kg)  BMI 14.81 kg/m2 BP 96/65 mmHg  Pulse 89  Temp(Src) 98.1 F (36.7 C) (Oral)  Ht 5' 3"  (1.6 m)  Wt 83 lb 9.6 oz (37.921 kg)  BMI 14.81 kg/m2 General appearance: alert, cooperative and appears stated age Head: Normocephalic, without obvious abnormality, atraumatic Eyes: conjunctivae/corneas clear. PERRL, EOM's intact. Fundi benign. Ears: normal TM's and external ear canals both ears Nose: Nares normal. Septum midline. Mucosa normal. No drainage or sinus tenderness. Throat:  lips, mucosa, and tongue normal; teeth and gums normal Neck: no adenopathy, no carotid bruit, no JVD, supple, symmetrical, trachea midline and thyroid not enlarged, symmetric, no tenderness/mass/nodules Back: symmetric, no curvature. ROM normal. No CVA tenderness. Lungs: clear to auscultation bilaterally Breasts: normal appearance, no masses or tenderness Heart: regular rate and rhythm, S1, S2 normal, no murmur, click, rub or gallop Abdomen: soft, non-tender; bowel sounds normal; no masses,  no organomegaly Pelvic: not indicated; post-menopausal, no abnormal Pap smears in past Extremities: extremities normal, atraumatic, no cyanosis or edema Pulses: 2+ and symmetric Skin: Skin color, texture, turgor normal. No rashes or lesions Lymph nodes: Cervical, supraclavicular, and axillary nodes normal. Neurologic: Alert and oriented X 3, normal strength and tone. Normal symmetric reflexes. Normal coordination and gait Psych- no depression, no anxiety Tobacco History  Smoking status  . Former Smoker  Smokeless tobacco  . Never Used     Counseling given: Not Answered   Past Medical History  Diagnosis Date  . Anxiety   . Depression   . Hyperlipidemia   . Anemia   . Cataract 05/2011  . Thyroid disease     hypothyroidism  . Allergy   . GERD (gastroesophageal reflux disease)   . Myocardial infarction ([place])   . Osteoporosis   . Esophageal yeast infection ([place])   . Esophageal disorder   . Hypothyroidism   . Transfusion history     last 2004  . Chronic low blood pressure     due to dehydration  . Hearing loss     hearing aids  . Renal cell carcinoma ([place])     found on CT   Past Surgical History  Procedure Laterality Date  .  Obstructed ureter      operative repair  . Abdominal hysterectomy    . Total abdominal hysterectomy w/ bilateral salpingoophorectomy    . Kyphosis surgery    . Basal cell carcinoma excision    . Eye surgery  05/2011  . Tonsillectomy    . Appendectomy    .  Colonoscopy    . Coronary artery bypass graft      x2 vessel bypass -Dr. Burt Knack -[facility] 06-22-14  . Renal cryoablation Right 09/2014   Family History  Problem Relation Age of Onset  . Parkinsonism Father   . Cancer Other     breast 1st degree relative  . Hypertension Other   . Heart disease Other     CAD.Marland Kitchen1ST DEGREE RELATIVE <60  . Hyperlipidemia Other   . Colon cancer Neg Hx   . Esophageal cancer Neg Hx   . Rectal cancer Neg Hx   . Stomach cancer Neg Hx    History  Sexual Activity  . Sexual Activity: Not Currently    Outpatient Encounter Prescriptions as of 09/27/2015  Medication Sig  . ALPRAZolam (XANAX) 0.5 MG tablet 1 po bid prn  . aspirin 81 MG EC tablet Take 81 mg by mouth daily.    . Cholecalciferol (VITAMIN D3) 1000 UNITS CAPS Take by mouth daily.    . ferrous fumarate (FERRETTS) 325 (106 FE) MG TABS Take 1 tablet by mouth daily.   . fluconazole (DIFLUCAN) 100 MG tablet Take 1 tablet (100 mg total) by mouth daily.  . fluticasone (FLONASE) 50 MCG/ACT nasal spray Place 2 sprays into both nostrils daily. (Patient taking differently: Place 2 sprays into both nostrils daily as needed for allergies. )  . folic acid (FOLVITE) 1 MG tablet Take 1 mg by mouth daily.    . furosemide (LASIX) 40 MG tablet Take 1 tablet (40 mg total) by mouth daily as needed.  Marland Kitchen levothyroxine (SYNTHROID, LEVOTHROID) 25 MCG tablet Take 1 tablet by mouth  daily  . levothyroxine (SYNTHROID, LEVOTHROID) 25 MCG tablet Take 1 tablet (25 mcg total) by mouth daily before breakfast.  . loratadine-pseudoephedrine (CLARITIN-D 24-HOUR) 10-240 MG per 24 hr tablet Take 1 tablet by mouth daily as needed for allergies.  Marland Kitchen metoCLOPramide (REGLAN) 10 MG tablet Take 1 tablet (10 mg total) by mouth at bedtime.  . Multiple Vitamin (MULTIVITAMIN WITH MINERALS) TABS tablet Take 1 tablet by mouth daily.  Marland Kitchen nystatin (MYCOSTATIN) 100000 UNIT/ML suspension Take 10 mLs (1,000,000 Units total) by mouth 4 (four) times daily.  (Patient taking differently: Take 10 mLs by mouth See admin instructions. Pt soaks dentures in once every 10 days)  . potassium chloride SA (K-DUR,KLOR-CON) 20 MEQ tablet Take 1 tablet (20 mEq total) by mouth 2 (two) times daily.  . promethazine (PHENERGAN) 25 MG tablet Take 1 tablet by mouth two  times daily  . ranitidine (ZANTAC) 150 MG tablet Take 150 mg by mouth 2 (two) times daily.  Marland Kitchen rOPINIRole (REQUIP) 1 MG tablet Take 1 tablet (1 mg total) by mouth at bedtime.  . simvastatin (ZOCOR) 20 MG tablet Take one tablet by mouth three times per week  . tiZANidine (ZANAFLEX) 2 MG tablet Take by mouth every 3 (three) hours as needed for muscle spasms.   . traMADol (ULTRAM) 50 MG tablet Take 1 tablet (50 mg total) by mouth every 6 (six) hours as needed.  . [DISCONTINUED] ALPRAZolam (XANAX) 0.5 MG tablet 1 po bid prn  . [DISCONTINUED] fluconazole (DIFLUCAN) 100 MG tablet Take 1  tablet (100 mg total) by mouth daily.  . [DISCONTINUED] levothyroxine (SYNTHROID, LEVOTHROID) 25 MCG tablet Take 25 mcg by mouth daily before breakfast.  . [DISCONTINUED] promethazine (PHENERGAN) 25 MG tablet Take 1 tablet by mouth two  times daily  . [DISCONTINUED] traMADol (ULTRAM) 50 MG tablet Take 1 tablet (50 mg total) by mouth every 6 (six) hours as needed.   No facility-administered encounter medications on file as of 09/27/2015.    Activities of Daily Living In your present state of health, do you have any difficulty performing the following activities: 09/27/2015 09/29/2014  Hearing? Y N  Vision? N N  Difficulty concentrating or making decisions? Y N  Walking or climbing stairs? N Y  Dressing or bathing? N N  Doing errands, shopping? Y N    Patient Care Team: Rosalita Chessman, DO as PCP - General Milus Banister, MD as Consulting Physician (Gastroenterology) Aletta Edouard, MD as Consulting Physician (Interventional Radiology) Amy Martinique, MD as Consulting Physician (Dermatology) Sherren Mocha, MD as Consulting  Physician (Cardiology) Monna Fam, MD as Consulting Physician (Ophthalmology)    Assessment:   CPe Exercise Activities and Dietary recommendations-- walks halls at home-- 60 x in 30 min     Goals    None     Fall Risk Fall Risk  09/27/2015 08/11/2014 06/06/2013  Falls in the past year? Yes No No  Number falls in past yr: 1 - -  Injury with Fall? Yes - -   Depression Screen PHQ 2/9 Scores 09/27/2015 08/11/2014 06/06/2013 05/31/2012  PHQ - 2 Score 0 0 0 0     Cognitive Testing--- mmse not done  Memory and conversation normal  Immunization History  Administered Date(s) Administered  . Influenza Split 05/26/2011, 05/31/2012  . Influenza Whole 05/31/2008, 05/17/2009, 05/22/2010  . Influenza, High Dose Seasonal PF 05/21/2014, 04/30/2015  . Influenza,inj,Quad PF,36+ Mos 06/06/2013  . Pneumococcal Conjugate-13 08/11/2014  . Pneumococcal Polysaccharide-23 06/11/2005   Screening Tests Health Maintenance  Topic Date Due  . TETANUS/TDAP  04/21/1947  . MAMMOGRAM  11/29/2014  . INFLUENZA VACCINE  03/25/2016  . DEXA SCAN  Completed  . ZOSTAVAX  Addressed  . PNA vac Low Risk Adult  Completed      Plan:    see AVS  During the course of the visit the patient was educated and counseled about the following appropriate screening and preventive services:   Vaccines to include Pneumoccal, Influenza, Hepatitis B, Td, Zostavax, HCV  Electrocardiogram  Cardiovascular Disease  Colorectal cancer screening  Bone density screening  Diabetes screening  Glaucoma screening  Mammography/PAP  Nutrition counseling   Patient Instructions (the written plan) was given to the patient.  1. Anxiety Stable, refill med - ALPRAZolam (XANAX) 0.5 MG tablet; 1 po bid prn  Dispense: 180 tablet; Refill: 0  2. Nausea without vomiting   - promethazine (PHENERGAN) 25 MG tablet; Take 1 tablet by mouth two  times daily  Dispense: 180 tablet; Refill: 3  3. Osteoarthritis of multiple joints,  unspecified osteoarthritis type   - traMADol (ULTRAM) 50 MG tablet; Take 1 tablet (50 mg total) by mouth every 6 (six) hours as needed.  Dispense: 270 tablet; Refill: 0  4. Osteoporosis   - DG Bone Density; Future  5. Hypothyroidism, unspecified hypothyroidism type   - levothyroxine (SYNTHROID, LEVOTHROID) 25 MCG tablet; Take 1 tablet (25 mcg total) by mouth daily before breakfast.  Dispense: 90 tablet; Refill: 3 - TSH  6. Thrush of mouth and esophagus ([place])   -  fluconazole (DIFLUCAN) 100 MG tablet; Take 1 tablet (100 mg total) by mouth daily.  Dispense: 90 tablet; Refill: 3  7. Medicare annual wellness visit, subsequent    8. Hyperlipidemia Check lab,  con't zocor - Comp Met (CMET) - Lipid panel  9. Essential hypertension   - CBC with Differential/Platelet - POCT urinalysis dipstick  10. Routine history and physical examination of adult    Garnet Koyanagi, DO  09/27/2015

## 2015-09-27 NOTE — Patient Instructions (Signed)
Preventive Care for Adults, Female A healthy lifestyle and preventive care can promote health and wellness. Preventive health guidelines for women include the following key practices.  A routine yearly physical is a good way to check with your health care provider about your health and preventive screening. It is a chance to share any concerns and updates on your health and to receive a thorough exam.  Visit your dentist for a routine exam and preventive care every 6 months. Brush your teeth twice a day and floss once a day. Good oral hygiene prevents tooth decay and gum disease.  The frequency of eye exams is based on your age, health, family medical history, use of contact lenses, and other factors. Follow your health care provider's recommendations for frequency of eye exams.  Eat a healthy diet. Foods like vegetables, fruits, whole grains, low-fat dairy products, and lean protein foods contain the nutrients you need without too many calories. Decrease your intake of foods high in solid fats, added sugars, and salt. Eat the right amount of calories for you.Get information about a proper diet from your health care provider, if necessary.  Regular physical exercise is one of the most important things you can do for your health. Most adults should get at least 150 minutes of moderate-intensity exercise (any activity that increases your heart rate and causes you to sweat) each week. In addition, most adults need muscle-strengthening exercises on 2 or more days a week.  Maintain a healthy weight. The body mass index (BMI) is a screening tool to identify possible weight problems. It provides an estimate of body fat based on height and weight. Your health care provider can find your BMI and can help you achieve or maintain a healthy weight.For adults 20 years and older:  A BMI below 18.5 is considered underweight.  A BMI of 18.5 to 24.9 is normal.  A BMI of 25 to 29.9 is considered overweight.  A  BMI of 30 and above is considered obese.  Maintain normal blood lipids and cholesterol levels by exercising and minimizing your intake of saturated fat. Eat a balanced diet with plenty of fruit and vegetables. Blood tests for lipids and cholesterol should begin at age 45 and be repeated every 5 years. If your lipid or cholesterol levels are high, you are over 50, or you are at high risk for heart disease, you may need your cholesterol levels checked more frequently.Ongoing high lipid and cholesterol levels should be treated with medicines if diet and exercise are not working.  If you smoke, find out from your health care provider how to quit. If you do not use tobacco, do not start.  Lung cancer screening is recommended for adults aged 45-80 years who are at high risk for developing lung cancer because of a history of smoking. A yearly low-dose CT scan of the lungs is recommended for people who have at least a 30-pack-year history of smoking and are a current smoker or have quit within the past 15 years. A pack year of smoking is smoking an average of 1 pack of cigarettes a day for 1 year (for example: 1 pack a day for 30 years or 2 packs a day for 15 years). Yearly screening should continue until the smoker has stopped smoking for at least 15 years. Yearly screening should be stopped for people who develop a health problem that would prevent them from having lung cancer treatment.  If you are pregnant, do not drink alcohol. If you are  breastfeeding, be very cautious about drinking alcohol. If you are not pregnant and choose to drink alcohol, do not have more than 1 drink per day. One drink is considered to be 12 ounces (355 mL) of beer, 5 ounces (148 mL) of wine, or 1.5 ounces (44 mL) of liquor.  Avoid use of street drugs. Do not share needles with anyone. Ask for help if you need support or instructions about stopping the use of drugs.  High blood pressure causes heart disease and increases the risk  of stroke. Your blood pressure should be checked at least every 1 to 2 years. Ongoing high blood pressure should be treated with medicines if weight loss and exercise do not work.  If you are 55-79 years old, ask your health care provider if you should take aspirin to prevent strokes.  Diabetes screening is done by taking a blood sample to check your blood glucose level after you have not eaten for a certain period of time (fasting). If you are not overweight and you do not have risk factors for diabetes, you should be screened once every 3 years starting at age 45. If you are overweight or obese and you are 40-70 years of age, you should be screened for diabetes every year as part of your cardiovascular risk assessment.  Breast cancer screening is essential preventive care for women. You should practice "breast self-awareness." This means understanding the normal appearance and feel of your breasts and may include breast self-examination. Any changes detected, no matter how small, should be reported to a health care provider. Women in their 20s and 30s should have a clinical breast exam (CBE) by a health care provider as part of a regular health exam every 1 to 3 years. After age 40, women should have a CBE every year. Starting at age 40, women should consider having a mammogram (breast X-ray test) every year. Women who have a family history of breast cancer should talk to their health care provider about genetic screening. Women at a high risk of breast cancer should talk to their health care providers about having an MRI and a mammogram every year.  Breast cancer gene (BRCA)-related cancer risk assessment is recommended for women who have family members with BRCA-related cancers. BRCA-related cancers include breast, ovarian, tubal, and peritoneal cancers. Having family members with these cancers may be associated with an increased risk for harmful changes (mutations) in the breast cancer genes BRCA1 and  BRCA2. Results of the assessment will determine the need for genetic counseling and BRCA1 and BRCA2 testing.  Your health care provider may recommend that you be screened regularly for cancer of the pelvic organs (ovaries, uterus, and vagina). This screening involves a pelvic examination, including checking for microscopic changes to the surface of your cervix (Pap test). You may be encouraged to have this screening done every 3 years, beginning at age 21.  For women ages 30-65, health care providers may recommend pelvic exams and Pap testing every 3 years, or they may recommend the Pap and pelvic exam, combined with testing for human papilloma virus (HPV), every 5 years. Some types of HPV increase your risk of cervical cancer. Testing for HPV may also be done on women of any age with unclear Pap test results.  Other health care providers may not recommend any screening for nonpregnant women who are considered low risk for pelvic cancer and who do not have symptoms. Ask your health care provider if a screening pelvic exam is right for   you.  If you have had past treatment for cervical cancer or a condition that could lead to cancer, you need Pap tests and screening for cancer for at least 20 years after your treatment. If Pap tests have been discontinued, your risk factors (such as having a new sexual partner) need to be reassessed to determine if screening should resume. Some women have medical problems that increase the chance of getting cervical cancer. In these cases, your health care provider may recommend more frequent screening and Pap tests.  Colorectal cancer can be detected and often prevented. Most routine colorectal cancer screening begins at the age of 50 years and continues through age 75 years. However, your health care provider may recommend screening at an earlier age if you have risk factors for colon cancer. On a yearly basis, your health care provider may provide home test kits to check  for hidden blood in the stool. Use of a small camera at the end of a tube, to directly examine the colon (sigmoidoscopy or colonoscopy), can detect the earliest forms of colorectal cancer. Talk to your health care provider about this at age 50, when routine screening begins. Direct exam of the colon should be repeated every 5-10 years through age 75 years, unless early forms of precancerous polyps or small growths are found.  People who are at an increased risk for hepatitis B should be screened for this virus. You are considered at high risk for hepatitis B if:  You were born in a country where hepatitis B occurs often. Talk with your health care provider about which countries are considered high risk.  Your parents were born in a high-risk country and you have not received a shot to protect against hepatitis B (hepatitis B vaccine).  You have HIV or AIDS.  You use needles to inject street drugs.  You live with, or have sex with, someone who has hepatitis B.  You get hemodialysis treatment.  You take certain medicines for conditions like cancer, organ transplantation, and autoimmune conditions.  Hepatitis C blood testing is recommended for all people born from 1945 through 1965 and any individual with known risks for hepatitis C.  Practice safe sex. Use condoms and avoid high-risk sexual practices to reduce the spread of sexually transmitted infections (STIs). STIs include gonorrhea, chlamydia, syphilis, trichomonas, herpes, HPV, and human immunodeficiency virus (HIV). Herpes, HIV, and HPV are viral illnesses that have no cure. They can result in disability, cancer, and death.  You should be screened for sexually transmitted illnesses (STIs) including gonorrhea and chlamydia if:  You are sexually active and are younger than 24 years.  You are older than 24 years and your health care provider tells you that you are at risk for this type of infection.  Your sexual activity has changed  since you were last screened and you are at an increased risk for chlamydia or gonorrhea. Ask your health care provider if you are at risk.  If you are at risk of being infected with HIV, it is recommended that you take a prescription medicine daily to prevent HIV infection. This is called preexposure prophylaxis (PrEP). You are considered at risk if:  You are sexually active and do not regularly use condoms or know the HIV status of your partner(s).  You take drugs by injection.  You are sexually active with a partner who has HIV.  Talk with your health care provider about whether you are at high risk of being infected with HIV. If   you choose to begin PrEP, you should first be tested for HIV. You should then be tested every 3 months for as long as you are taking PrEP.  Osteoporosis is a disease in which the bones lose minerals and strength with aging. This can result in serious bone fractures or breaks. The risk of osteoporosis can be identified using a bone density scan. Women ages 67 years and over and women at risk for fractures or osteoporosis should discuss screening with their health care providers. Ask your health care provider whether you should take a calcium supplement or vitamin D to reduce the rate of osteoporosis.  Menopause can be associated with physical symptoms and risks. Hormone replacement therapy is available to decrease symptoms and risks. You should talk to your health care provider about whether hormone replacement therapy is right for you.  Use sunscreen. Apply sunscreen liberally and repeatedly throughout the day. You should seek shade when your shadow is shorter than you. Protect yourself by wearing long sleeves, pants, a wide-brimmed hat, and sunglasses year round, whenever you are outdoors.  Once a month, do a whole body skin exam, using a mirror to look at the skin on your back. Tell your health care provider of new moles, moles that have irregular borders, moles that  are larger than a pencil eraser, or moles that have changed in shape or color.  Stay current with required vaccines (immunizations).  Influenza vaccine. All adults should be immunized every year.  Tetanus, diphtheria, and acellular pertussis (Td, Tdap) vaccine. Pregnant women should receive 1 dose of Tdap vaccine during each pregnancy. The dose should be obtained regardless of the length of time since the last dose. Immunization is preferred during the 27th-36th week of gestation. An adult who has not previously received Tdap or who does not know her vaccine status should receive 1 dose of Tdap. This initial dose should be followed by tetanus and diphtheria toxoids (Td) booster doses every 10 years. Adults with an unknown or incomplete history of completing a 3-dose immunization series with Td-containing vaccines should begin or complete a primary immunization series including a Tdap dose. Adults should receive a Td booster every 10 years.  Varicella vaccine. An adult without evidence of immunity to varicella should receive 2 doses or a second dose if she has previously received 1 dose. Pregnant females who do not have evidence of immunity should receive the first dose after pregnancy. This first dose should be obtained before leaving the health care facility. The second dose should be obtained 4-8 weeks after the first dose.  Human papillomavirus (HPV) vaccine. Females aged 13-26 years who have not received the vaccine previously should obtain the 3-dose series. The vaccine is not recommended for use in pregnant females. However, pregnancy testing is not needed before receiving a dose. If a female is found to be pregnant after receiving a dose, no treatment is needed. In that case, the remaining doses should be delayed until after the pregnancy. Immunization is recommended for any person with an immunocompromised condition through the age of 61 years if she did not get any or all doses earlier. During the  3-dose series, the second dose should be obtained 4-8 weeks after the first dose. The third dose should be obtained 24 weeks after the first dose and 16 weeks after the second dose.  Zoster vaccine. One dose is recommended for adults aged 30 years or older unless certain conditions are present.  Measles, mumps, and rubella (MMR) vaccine. Adults born  before 1957 generally are considered immune to measles and mumps. Adults born in 1957 or later should have 1 or more doses of MMR vaccine unless there is a contraindication to the vaccine or there is laboratory evidence of immunity to each of the three diseases. A routine second dose of MMR vaccine should be obtained at least 28 days after the first dose for students attending postsecondary schools, health care workers, or international travelers. People who received inactivated measles vaccine or an unknown type of measles vaccine during 1963-1967 should receive 2 doses of MMR vaccine. People who received inactivated mumps vaccine or an unknown type of mumps vaccine before 1979 and are at high risk for mumps infection should consider immunization with 2 doses of MMR vaccine. For females of childbearing age, rubella immunity should be determined. If there is no evidence of immunity, females who are not pregnant should be vaccinated. If there is no evidence of immunity, females who are pregnant should delay immunization until after pregnancy. Unvaccinated health care workers born before 1957 who lack laboratory evidence of measles, mumps, or rubella immunity or laboratory confirmation of disease should consider measles and mumps immunization with 2 doses of MMR vaccine or rubella immunization with 1 dose of MMR vaccine.  Pneumococcal 13-valent conjugate (PCV13) vaccine. When indicated, a person who is uncertain of his immunization history and has no record of immunization should receive the PCV13 vaccine. All adults 65 years of age and older should receive this  vaccine. An adult aged 19 years or older who has certain medical conditions and has not been previously immunized should receive 1 dose of PCV13 vaccine. This PCV13 should be followed with a dose of pneumococcal polysaccharide (PPSV23) vaccine. Adults who are at high risk for pneumococcal disease should obtain the PPSV23 vaccine at least 8 weeks after the dose of PCV13 vaccine. Adults older than 80 years of age who have normal immune system function should obtain the PPSV23 vaccine dose at least 1 year after the dose of PCV13 vaccine.  Pneumococcal polysaccharide (PPSV23) vaccine. When PCV13 is also indicated, PCV13 should be obtained first. All adults aged 65 years and older should be immunized. An adult younger than age 65 years who has certain medical conditions should be immunized. Any person who resides in a nursing home or long-term care facility should be immunized. An adult smoker should be immunized. People with an immunocompromised condition and certain other conditions should receive both PCV13 and PPSV23 vaccines. People with human immunodeficiency virus (HIV) infection should be immunized as soon as possible after diagnosis. Immunization during chemotherapy or radiation therapy should be avoided. Routine use of PPSV23 vaccine is not recommended for American Indians, [place] Natives, or people younger than 65 years unless there are medical conditions that require PPSV23 vaccine. When indicated, people who have unknown immunization and have no record of immunization should receive PPSV23 vaccine. One-time revaccination 5 years after the first dose of PPSV23 is recommended for people aged 19-64 years who have chronic kidney failure, nephrotic syndrome, asplenia, or immunocompromised conditions. People who received 1-2 doses of PPSV23 before age 65 years should receive another dose of PPSV23 vaccine at age 65 years or later if at least 5 years have passed since the previous dose. Doses of PPSV23 are not  needed for people immunized with PPSV23 at or after age 65 years.  Meningococcal vaccine. Adults with asplenia or persistent complement component deficiencies should receive 2 doses of quadrivalent meningococcal conjugate (MenACWY-D) vaccine. The doses should be obtained   at least 2 months apart. Microbiologists working with certain meningococcal bacteria, [place] recruits, people at risk during an outbreak, and people who travel to or live in countries with a high rate of meningitis should be immunized. A first-year college student up through age 34 years who is living in a residence hall should receive a dose if she did not receive a dose on or after her 16th birthday. Adults who have certain high-risk conditions should receive one or more doses of vaccine.  Hepatitis A vaccine. Adults who wish to be protected from this disease, have certain high-risk conditions, work with hepatitis A-infected animals, work in hepatitis A research labs, or travel to or work in countries with a high rate of hepatitis A should be immunized. Adults who were previously unvaccinated and who anticipate close contact with an international adoptee during the first 60 days after arrival in the [place] from a country with a high rate of hepatitis A should be immunized.  Hepatitis B vaccine. Adults who wish to be protected from this disease, have certain high-risk conditions, may be exposed to blood or other infectious body fluids, are household contacts or sex partners of hepatitis B positive people, are clients or workers in certain care facilities, or travel to or work in countries with a high rate of hepatitis B should be immunized.  Haemophilus influenzae type b (Hib) vaccine. A previously unvaccinated person with asplenia or sickle cell disease or having a scheduled splenectomy should receive 1 dose of Hib vaccine. Regardless of previous immunization, a recipient of a hematopoietic stem cell transplant should receive a  3-dose series 6-12 months after her successful transplant. Hib vaccine is not recommended for adults with HIV infection. Preventive Services / Frequency Ages 35 to 4 years  Blood pressure check.** / Every 3-5 years.  Lipid and cholesterol check.** / Every 5 years beginning at age 60.  Clinical breast exam.** / Every 3 years for women in their 71s and 10s.  BRCA-related cancer risk assessment.** / For women who have family members with a BRCA-related cancer (breast, ovarian, tubal, or peritoneal cancers).  Pap test.** / Every 2 years from ages 76 through 26. Every 3 years starting at age 61 through age 76 or 93 with a history of 3 consecutive normal Pap tests.  HPV screening.** / Every 3 years from ages 37 through ages 60 to 51 with a history of 3 consecutive normal Pap tests.  Hepatitis C blood test.** / For any individual with known risks for hepatitis C.  Skin self-exam. / Monthly.  Influenza vaccine. / Every year.  Tetanus, diphtheria, and acellular pertussis (Tdap, Td) vaccine.** / Consult your health care provider. Pregnant women should receive 1 dose of Tdap vaccine during each pregnancy. 1 dose of Td every 10 years.  Varicella vaccine.** / Consult your health care provider. Pregnant females who do not have evidence of immunity should receive the first dose after pregnancy.  HPV vaccine. / 3 doses over 6 months, if 93 and younger. The vaccine is not recommended for use in pregnant females. However, pregnancy testing is not needed before receiving a dose.  Measles, mumps, rubella (MMR) vaccine.** / You need at least 1 dose of MMR if you were born in 1957 or later. You may also need a 2nd dose. For females of childbearing age, rubella immunity should be determined. If there is no evidence of immunity, females who are not pregnant should be vaccinated. If there is no evidence of immunity, females who are  pregnant should delay immunization until after pregnancy.  Pneumococcal  13-valent conjugate (PCV13) vaccine.** / Consult your health care provider.  Pneumococcal polysaccharide (PPSV23) vaccine.** / 1 to 2 doses if you smoke cigarettes or if you have certain conditions.  Meningococcal vaccine.** / 1 dose if you are age 68 to 8 years and a Market researcher living in a residence hall, or have one of several medical conditions, you need to get vaccinated against meningococcal disease. You may also need additional booster doses.  Hepatitis A vaccine.** / Consult your health care provider.  Hepatitis B vaccine.** / Consult your health care provider.  Haemophilus influenzae type b (Hib) vaccine.** / Consult your health care provider. Ages 7 to 53 years  Blood pressure check.** / Every year.  Lipid and cholesterol check.** / Every 5 years beginning at age 25 years.  Lung cancer screening. / Every year if you are aged 11-80 years and have a 30-pack-year history of smoking and currently smoke or have quit within the past 15 years. Yearly screening is stopped once you have quit smoking for at least 15 years or develop a health problem that would prevent you from having lung cancer treatment.  Clinical breast exam.** / Every year after age 48 years.  BRCA-related cancer risk assessment.** / For women who have family members with a BRCA-related cancer (breast, ovarian, tubal, or peritoneal cancers).  Mammogram.** / Every year beginning at age 41 years and continuing for as long as you are in good health. Consult with your health care provider.  Pap test.** / Every 3 years starting at age 65 years through age 37 or 70 years with a history of 3 consecutive normal Pap tests.  HPV screening.** / Every 3 years from ages 72 years through ages 60 to 40 years with a history of 3 consecutive normal Pap tests.  Fecal occult blood test (FOBT) of stool. / Every year beginning at age 21 years and continuing until age 5 years. You may not need to do this test if you get  a colonoscopy every 10 years.  Flexible sigmoidoscopy or colonoscopy.** / Every 5 years for a flexible sigmoidoscopy or every 10 years for a colonoscopy beginning at age 35 years and continuing until age 48 years.  Hepatitis C blood test.** / For all people born from [birth year] through 1965 and any individual with known risks for hepatitis C.  Skin self-exam. / Monthly.  Influenza vaccine. / Every year.  Tetanus, diphtheria, and acellular pertussis (Tdap/Td) vaccine.** / Consult your health care provider. Pregnant women should receive 1 dose of Tdap vaccine during each pregnancy. 1 dose of Td every 10 years.  Varicella vaccine.** / Consult your health care provider. Pregnant females who do not have evidence of immunity should receive the first dose after pregnancy.  Zoster vaccine.** / 1 dose for adults aged 30 years or older.  Measles, mumps, rubella (MMR) vaccine.** / You need at least 1 dose of MMR if you were born in 1957 or later. You may also need a second dose. For females of childbearing age, rubella immunity should be determined. If there is no evidence of immunity, females who are not pregnant should be vaccinated. If there is no evidence of immunity, females who are pregnant should delay immunization until after pregnancy.  Pneumococcal 13-valent conjugate (PCV13) vaccine.** / Consult your health care provider.  Pneumococcal polysaccharide (PPSV23) vaccine.** / 1 to 2 doses if you smoke cigarettes or if you have certain conditions.  Meningococcal vaccine.** /  Consult your health care provider.  Hepatitis A vaccine.** / Consult your health care provider.  Hepatitis B vaccine.** / Consult your health care provider.  Haemophilus influenzae type b (Hib) vaccine.** / Consult your health care provider. Ages 64 years and over  Blood pressure check.** / Every year.  Lipid and cholesterol check.** / Every 5 years beginning at age 23 years.  Lung cancer screening. / Every year if you  are aged 16-80 years and have a 30-pack-year history of smoking and currently smoke or have quit within the past 15 years. Yearly screening is stopped once you have quit smoking for at least 15 years or develop a health problem that would prevent you from having lung cancer treatment.  Clinical breast exam.** / Every year after age 74 years.  BRCA-related cancer risk assessment.** / For women who have family members with a BRCA-related cancer (breast, ovarian, tubal, or peritoneal cancers).  Mammogram.** / Every year beginning at age 44 years and continuing for as long as you are in good health. Consult with your health care provider.  Pap test.** / Every 3 years starting at age 58 years through age 22 or 39 years with 3 consecutive normal Pap tests. Testing can be stopped between 65 and 70 years with 3 consecutive normal Pap tests and no abnormal Pap or HPV tests in the past 10 years.  HPV screening.** / Every 3 years from ages 64 years through ages 70 or 61 years with a history of 3 consecutive normal Pap tests. Testing can be stopped between 65 and 70 years with 3 consecutive normal Pap tests and no abnormal Pap or HPV tests in the past 10 years.  Fecal occult blood test (FOBT) of stool. / Every year beginning at age 40 years and continuing until age 27 years. You may not need to do this test if you get a colonoscopy every 10 years.  Flexible sigmoidoscopy or colonoscopy.** / Every 5 years for a flexible sigmoidoscopy or every 10 years for a colonoscopy beginning at age 7 years and continuing until age 32 years.  Hepatitis C blood test.** / For all people born from [birth year] through 1963 and any individual with known risks for hepatitis C.  Osteoporosis screening.** / A one-time screening for women ages 30 years and over and women at risk for fractures or osteoporosis.  Skin self-exam. / Monthly.  Influenza vaccine. / Every year.  Tetanus, diphtheria, and acellular pertussis (Tdap/Td)  vaccine.** / 1 dose of Td every 10 years.  Varicella vaccine.** / Consult your health care provider.  Zoster vaccine.** / 1 dose for adults aged 35 years or older.  Pneumococcal 13-valent conjugate (PCV13) vaccine.** / Consult your health care provider.  Pneumococcal polysaccharide (PPSV23) vaccine.** / 1 dose for all adults aged 46 years and older.  Meningococcal vaccine.** / Consult your health care provider.  Hepatitis A vaccine.** / Consult your health care provider.  Hepatitis B vaccine.** / Consult your health care provider.  Haemophilus influenzae type b (Hib) vaccine.** / Consult your health care provider. ** Family history and personal history of risk and conditions may change your health care provider's recommendations.   This information is not intended to replace advice given to you by your health care provider. Make sure you discuss any questions you have with your health care provider.   Document Released: 10/07/2001 Document Revised: 09/01/2014 Document Reviewed: 01/06/2011 Elsevier Interactive Patient Education [employer].

## 2015-09-27 NOTE — Progress Notes (Signed)
Pre visit review using our clinic review tool, if applicable. No additional management support is needed unless otherwise documented below in the visit note. 

## 2015-09-30 ENCOUNTER — Encounter: Payer: Self-pay | Admitting: Family Medicine

## 2015-09-30 DIAGNOSIS — E785 Hyperlipidemia, unspecified: Secondary | ICD-10-CM

## 2015-09-30 DIAGNOSIS — I2510 Atherosclerotic heart disease of native coronary artery without angina pectoris: Secondary | ICD-10-CM

## 2015-10-01 MED ORDER — SIMVASTATIN 20 MG PO TABS: 20.0000 mg | ORAL_TABLET | Freq: Every day | ORAL | Status: DC

## 2015-10-04 DIAGNOSIS — Z961 Presence of intraocular lens: Secondary | ICD-10-CM | POA: Diagnosis not present

## 2015-10-04 DIAGNOSIS — H40013 Open angle with borderline findings, low risk, bilateral: Secondary | ICD-10-CM | POA: Diagnosis not present

## 2015-10-04 DIAGNOSIS — H1013 Acute atopic conjunctivitis, bilateral: Secondary | ICD-10-CM | POA: Diagnosis not present

## 2015-10-04 DIAGNOSIS — H01003 Unspecified blepharitis right eye, unspecified eyelid: Secondary | ICD-10-CM | POA: Diagnosis not present

## 2015-10-23 ENCOUNTER — Ambulatory Visit (HOSPITAL_COMMUNITY)
Admission: RE | Admit: 2015-10-23 | Discharge: 2015-10-23 | Disposition: A | Discharge status: Home / Self Care | Payer: MEDICARE | Source: Ambulatory Visit | Attending: Interventional Radiology | Admitting: Interventional Radiology

## 2015-10-23 ENCOUNTER — Ambulatory Visit
Admission: RE | Admit: 2015-10-23 | Discharge: 2015-10-23 | Disposition: A | Discharge status: Home / Self Care | Payer: MEDICARE | Source: Ambulatory Visit | Attending: Interventional Radiology | Admitting: Interventional Radiology

## 2015-10-23 ENCOUNTER — Encounter (HOSPITAL_COMMUNITY): Payer: Self-pay

## 2015-10-23 DIAGNOSIS — K8020 Calculus of gallbladder without cholecystitis without obstruction: Secondary | ICD-10-CM | POA: Insufficient documentation

## 2015-10-23 DIAGNOSIS — N2889 Other specified disorders of kidney and ureter: Secondary | ICD-10-CM | POA: Insufficient documentation

## 2015-10-23 DIAGNOSIS — N3289 Other specified disorders of bladder: Secondary | ICD-10-CM | POA: Diagnosis not present

## 2015-10-23 DIAGNOSIS — I2510 Atherosclerotic heart disease of native coronary artery without angina pectoris: Secondary | ICD-10-CM | POA: Diagnosis not present

## 2015-10-23 DIAGNOSIS — N200 Calculus of kidney: Secondary | ICD-10-CM | POA: Diagnosis not present

## 2015-10-23 DIAGNOSIS — M4856XA Collapsed vertebra, not elsewhere classified, lumbar region, initial encounter for fracture: Secondary | ICD-10-CM | POA: Diagnosis not present

## 2015-10-23 MED ORDER — IOHEXOL 300 MG/ML  SOLN
100.0000 mL | Freq: Once | INTRAMUSCULAR | Status: AC | PRN
  Administered 2015-10-23: 60 mL via INTRAVENOUS

## 2015-11-05 NOTE — Progress Notes (Signed)
Chief Complaint: Status post cryoablation of right renal neoplasm on 10/06/14.  History of Present Illness: [name] is a 82 y.o. female who is one year post percutaneous cryoablation of an enlarging right renal mass.  She has done well with no current complaints.  Previous CT on 12/26/14 demonstrated suggestion of marginal enhancement 3 months after ablation.   Past Medical History  Diagnosis Date  . Anxiety   . Depression   . Hyperlipidemia   . Anemia   . Cataract 05/2011  . Thyroid disease     hypothyroidism  . Allergy   . GERD (gastroesophageal reflux disease)   . Myocardial infarction ([place])   . Osteoporosis   . Esophageal yeast infection ([place])   . Esophageal disorder   . Hypothyroidism   . Transfusion history     last 2004  . Chronic low blood pressure     due to dehydration  . Hearing loss     hearing aids  . Renal cell carcinoma ([place])     found on CT    Past Surgical History  Procedure Laterality Date  . Obstructed ureter      operative repair  . Abdominal hysterectomy    . Total abdominal hysterectomy w/ bilateral salpingoophorectomy    . Kyphosis surgery    . Basal cell carcinoma excision    . Eye surgery  05/2011  . Tonsillectomy    . Appendectomy    . Colonoscopy    . Coronary artery bypass graft      x2 vessel bypass -Dr. Burt Knack -[facility] 06-22-14  . Renal cryoablation Right 09/2014    Allergies: Zofran; Clarithromycin; Codeine; Diazepam; Sulfonamide derivatives; and Prilosec  Medications: Prior to Admission medications   Medication Sig Start Date End Date Taking? Authorizing Provider  ALPRAZolam Duanne Moron) 0.5 MG tablet 1 po bid prn 09/27/15   Rosalita Chessman, DO  aspirin 81 MG EC tablet Take 81 mg by mouth daily.      Historical Provider, MD  Cholecalciferol (VITAMIN D3) 1000 UNITS CAPS Take by mouth daily.      Historical Provider, MD  ferrous fumarate (FERRETTS) 325 (106 FE) MG TABS Take 1 tablet by mouth daily.     Historical  Provider, MD  fluconazole (DIFLUCAN) 100 MG tablet Take 1 tablet (100 mg total) by mouth daily. 09/27/15   Alferd Apa Lowne, DO  fluticasone (FLONASE) 50 MCG/ACT nasal spray Place 2 sprays into both nostrils daily. Patient taking differently: Place 2 sprays into both nostrils daily as needed for allergies.  09/07/13   Rosalita Chessman, DO  folic acid (FOLVITE) 1 MG tablet Take 1 mg by mouth daily.      Historical Provider, MD  furosemide (LASIX) 40 MG tablet Take 1 tablet (40 mg total) by mouth daily as needed. 07/04/15   Rosalita Chessman, DO  levothyroxine (SYNTHROID, LEVOTHROID) 25 MCG tablet Take 1 tablet by mouth  daily 08/11/14   Rosalita Chessman, DO  levothyroxine (SYNTHROID, LEVOTHROID) 25 MCG tablet Take 1 tablet (25 mcg total) by mouth daily before breakfast. 09/27/15   Rosalita Chessman, DO  loratadine-pseudoephedrine (CLARITIN-D 24-HOUR) 10-240 MG per 24 hr tablet Take 1 tablet by mouth daily as needed for allergies.    Historical Provider, MD  metoCLOPramide (REGLAN) 10 MG tablet Take 1 tablet (10 mg total) by mouth at bedtime. 07/04/15   Rosalita Chessman, DO  Multiple Vitamin (MULTIVITAMIN WITH MINERALS) TABS tablet Take 1 tablet by mouth daily.  Historical Provider, MD  nystatin (MYCOSTATIN) 100000 UNIT/ML suspension Take 10 mLs (1,000,000 Units total) by mouth 4 (four) times daily. Patient taking differently: Take 10 mLs by mouth See admin instructions. Pt soaks dentures in once every 10 days 03/24/14   Milus Banister, MD  potassium chloride SA (K-DUR,KLOR-CON) 20 MEQ tablet Take 1 tablet (20 mEq total) by mouth 2 (two) times daily. 07/04/15   Rosalita Chessman, DO  promethazine (PHENERGAN) 25 MG tablet Take 1 tablet by mouth two  times daily 09/27/15   Rosalita Chessman, DO  ranitidine (ZANTAC) 150 MG tablet Take 150 mg by mouth 2 (two) times daily.    Historical Provider, MD  rOPINIRole (REQUIP) 1 MG tablet Take 1 tablet (1 mg total) by mouth at bedtime. 07/04/15   Rosalita Chessman, DO  simvastatin (ZOCOR) 20 MG  tablet Take 1 tablet (20 mg total) by mouth daily. 10/01/15   Rosalita Chessman, DO  tiZANidine (ZANAFLEX) 2 MG tablet Take by mouth every 3 (three) hours as needed for muscle spasms.     Historical Provider, MD  traMADol (ULTRAM) 50 MG tablet Take 1 tablet (50 mg total) by mouth every 6 (six) hours as needed. 09/27/15   Rosalita Chessman, DO     Family History  Problem Relation Age of Onset  . Parkinsonism Father   . Cancer Other     breast 1st degree relative  . Hypertension Other   . Heart disease Other     CAD.Marland Kitchen1ST DEGREE RELATIVE <60  . Hyperlipidemia Other   . Colon cancer Neg Hx   . Esophageal cancer Neg Hx   . Rectal cancer Neg Hx   . Stomach cancer Neg Hx     Social History   Social History  . Marital Status: Single    Spouse Name: N/A  . Number of Children: 2  . Years of Education: N/A   Occupational History  . Retired    Social History Main Topics  . Smoking status: Former Research scientist (life sciences)  . Smokeless tobacco: Never Used  . Alcohol Use: No  . Drug Use: No  . Sexual Activity: Not Currently   Other Topics Concern  . Not on file   Social History Narrative   REG EXERCISE   [place]   LIVES ALONE   END OF LIFE:PATIENT HAS LIVING WILL AND CLEARLY STATES SHE DOES NOT WANT CARDIAC [place] VENTILATION OR OTHER HEROIC OR FUTILE MEASURES.     Review of Systems: A 12 point ROS discussed and pertinent positives are indicated in the HPI above.  All other systems are negative.  Review of Systems  Constitutional: Negative.   Respiratory: Negative.   Cardiovascular: Negative.   Gastrointestinal: Negative.   Genitourinary: Negative.   Musculoskeletal: Negative.   Neurological: Negative.      Vital Signs: BP 85/47 mmHg  Pulse 81  Temp(Src) 98 F (36.7 C) (Oral)  Resp 15  SpO2 91%  Physical Exam  Constitutional: She is oriented to person, place, and time. She appears well-developed and well-nourished. No distress.  Abdominal: Soft. She exhibits no distension  and no mass. There is no tenderness. There is no rebound and no guarding.  Neurological: She is alert and oriented to person, place, and time.  Skin: She is not diaphoretic.  Nursing note and vitals reviewed.   Imaging: Ct Abd Wo & W Cm  10/23/2015  CLINICAL DATA:  Status post cryoablation of right renal mass on 10/06/2014, presenting for follow-up. Prior appendectomy and hysterectomy.  EXAM: CT ABDOMEN WITHOUT AND WITH CONTRAST TECHNIQUE: Multidetector CT imaging of the abdomen was performed following the standard protocol before and following the bolus administration of intravenous contrast. CONTRAST:  17m OMNIPAQUE IOHEXOL 300 MG/ML  SOLN COMPARISON:  12/26/2014 CT abdomen. FINDINGS: Lower chest: There are tiny 2 mm subpleural pulmonary nodules in the right lower lobe, stable since 10/19/2012 and benign. Coronary atherosclerosis. Hepatobiliary: Normal liver with no liver mass. There is a layering subcentimeter gallstone in the nondistended gallbladder. There is mild focal adenomyomatosis at the gallbladder fundus. Otherwise no gallbladder wall thickening. No pericholecystic fluid. No biliary ductal dilatation. Pancreas: Normal, with no mass or duct dilation. Spleen: Normal size. No mass. 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 Adrenals/Urinary Tract: Normal adrenals. Nonobstructing 7 x 4 mm stone in the mid left kidney and nonobstructing 6 x 3 mm stone in the lower left kidney. No right renal stones. No hydronephrosis. Stable mildly prominent bilateral extrarenal pelves. There is a 1.2 x 0.9 cm ovoid focus at the ablation zone in the lateral interpolar right kidney, which demonstrates precontrast hyperdensity and questionable minimal enhancement (10 HU), and is significantly decreased in size from 2.2 x 1.7 cm on the 12/26/2014 study, favor evolving post ablation fibrosis. There is a stable mildly lobulated simple 1.2 cm renal cyst  in the lateral mid to lower right kidney. There are several additional subcentimeter hypodense renal cortical lesions in both kidneys, too small to characterize, not appreciably changed. Stomach/Bowel: Grossly normal stomach. Visualized small and large bowel is normal caliber, with no bowel wall thickening. Vascular/Lymphatic: Atherosclerotic nonaneurysmal abdominal aorta. Patent portal, splenic, hepatic and renal veins. No pathologically enlarged lymph nodes in the abdomen. Other: No pneumoperitoneum, ascites or focal fluid collection. Musculoskeletal: No aggressive appearing focal osseous lesions. Stable moderate to severe L1 vertebral compression fracture status post vertebroplasty. Stable mild L4 and moderate L5 vertebral compression fractures. Moderate degenerative changes in the visualized thoracolumbar spine. IMPRESSION: 1. Small residual ovoid hyperdense focus at the ablation zone in the right kidney with questionable minimal enhancement, significantly decreased in size in the interval, favor evolving post ablation fibrosis. Continued CT abdomen with and without IV contrast surveillance is advised. 2. No evidence of metastatic disease in the abdomen. 3. Additional findings include coronary atherosclerosis, cholelithiasis, focal adenomyomatosis at the gallbladder fundus, nonobstructing left renal stones and stable chronic lumbar vertebral compression fractures. Electronically Signed   By: JIlona SorrelM.D.   On: 10/23/2015 09:53    Labs:  CBC:  Recent Labs  09/27/15 0916  WBC 7.7  HGB 13.8  HCT 42.7  PLT 306.0    COAGS: No results for input(s): INR, APTT in the last 8760 hours.  BMP:  Recent Labs  12/19/14 0859 03/02/15 0732 09/27/15 0916  NA  --  141 142  K  --  4.1 3.2*  CL  --  110 105  CO2  --  23 25  GLUCOSE  --  87 85  BUN 16 13 15   CALCIUM  --  9.4 9.0  CREATININE 0.96 1.05 1.41*  GFRNONAA 54*  --   --   GFRAA 63  --   --     LIVER FUNCTION TESTS:  Recent Labs   03/02/15 0732 09/27/15 0916  BILITOT 0.4 0.4  AST 29 28  ALT 25 25  ALKPHOS 60 59  PROT 6.1 6.5  ALBUMIN 3.5 3.8    TUMOR MARKERS: No results for  input(s): AFPTM, CEA, CA199, CHROMGRNA in the last 8760 hours.  Assessment and Plan:  I met with [name] and her daughter.  We reviewed imaging findings by CT today.  This shows retraction of the right lateral ablation defect with some potential mild internal enhancement.  I told [name] that retraction of tissue is reassuring, and I am not concerned currently of tumor recurrence.  Renal function recently showed some increase in creatinine.  There is no hydronephrosis or other renal findings by CT.  I recommended rechecking creatinine around this time next year and obtained another follow up CT should renal function be adequate to administer IV contrast.  Electronically Signed: Lawanda Holzheimer T 11/05/2015, 12:59 PM   I spent a total of 15 Minutes in face to face in clinical consultation, greater than 50% of which was counseling/coordinating care post right renal ablation.

## 2015-11-29 ENCOUNTER — Other Ambulatory Visit: Payer: Self-pay | Admitting: Family Medicine

## 2015-11-30 ENCOUNTER — Telehealth: Payer: Self-pay

## 2015-11-30 DIAGNOSIS — F419 Anxiety disorder, unspecified: Secondary | ICD-10-CM

## 2015-11-30 DIAGNOSIS — M159 Polyosteoarthritis, unspecified: Secondary | ICD-10-CM

## 2015-11-30 MED ORDER — ALPRAZOLAM 0.5 MG PO TABS: ORAL_TABLET | ORAL | Status: DC

## 2015-11-30 MED ORDER — TRAMADOL HCL 50 MG PO TABS: 50.0000 mg | ORAL_TABLET | Freq: Four times a day (QID) | ORAL | Status: DC | PRN

## 2015-11-30 NOTE — Telephone Encounter (Signed)
Last seen and filled 09/27/15  Xanax #180 Tramadol #270   Please advise    KP

## 2015-11-30 NOTE — Telephone Encounter (Signed)
Refill x1 each 

## 2015-11-30 NOTE — Telephone Encounter (Signed)
Rx phoned into [facility].    KP

## 2015-12-20 ENCOUNTER — Encounter: Payer: Self-pay | Admitting: Family Medicine

## 2015-12-20 DIAGNOSIS — E785 Hyperlipidemia, unspecified: Secondary | ICD-10-CM

## 2016-01-01 IMAGING — CT CT GUIDANCE TISSUE ABLATION
3 of 21 series · 8 of 32 positions shown, 12 images · non-contrast
Comparison: none

CLINICAL DATA: Solid, enhancing renal renal tumor measuring
approximately 1.6 x 2.2 cm and demonstrating growth over time. The
patient presents for percutaneous cryoablation of the tumor.

[Series 2: rt.renalcryo · axial · 0.60mm/px · z∈[-174,-114]mm · 2 of 36 slices shown]
[im 12/36  soft-tissue]
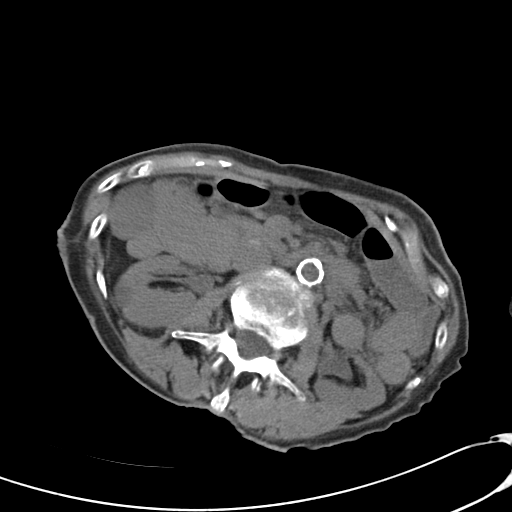
[im 24/36  soft-tissue]
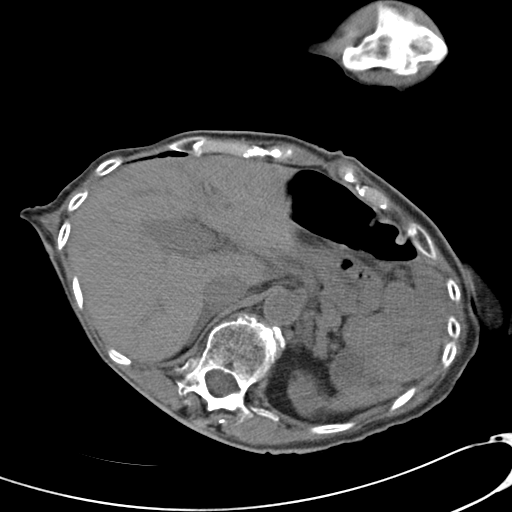

[Series 5: rt.renalcryoabl · axial · 0.74mm/px · z∈[-142,-52]mm · 3 of 36 slices shown, 7 images (1 of 2)]
[im 9/36  soft-tissue]
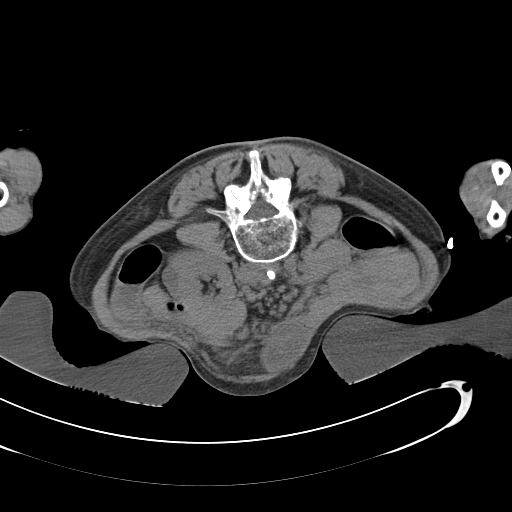
[im 9/36  lung]
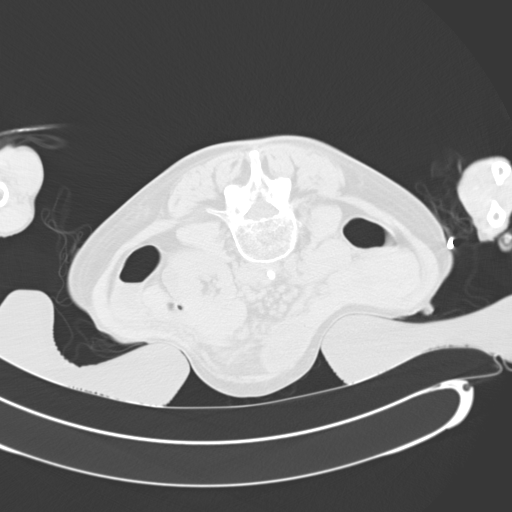
[im 9/36  bone]
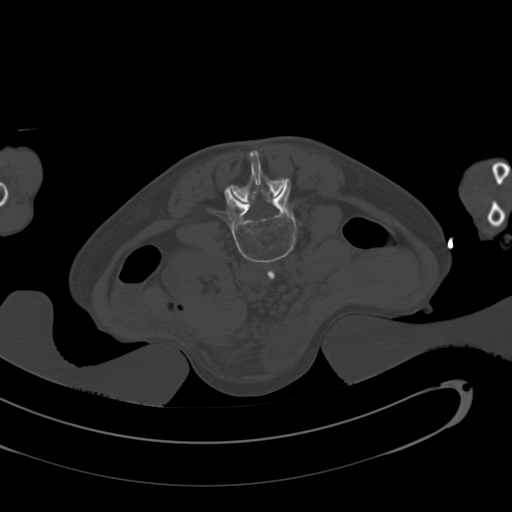
[im 18/36  soft-tissue]
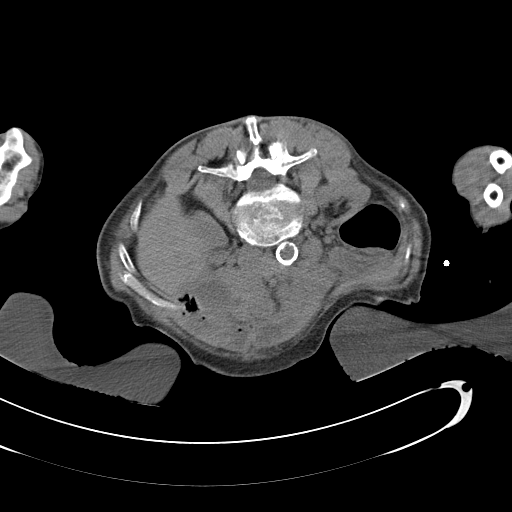
[im 18/36  lung]
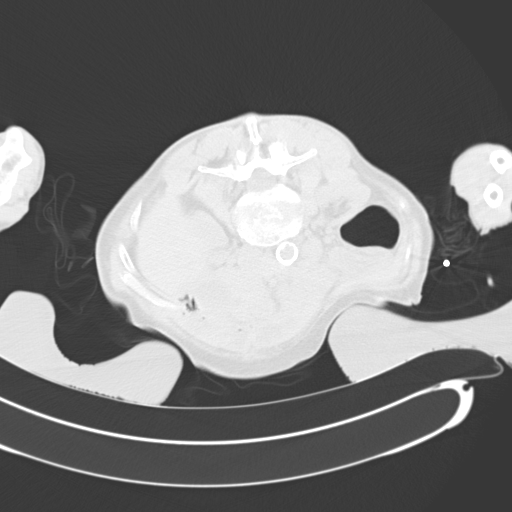
[im 27/36  soft-tissue]
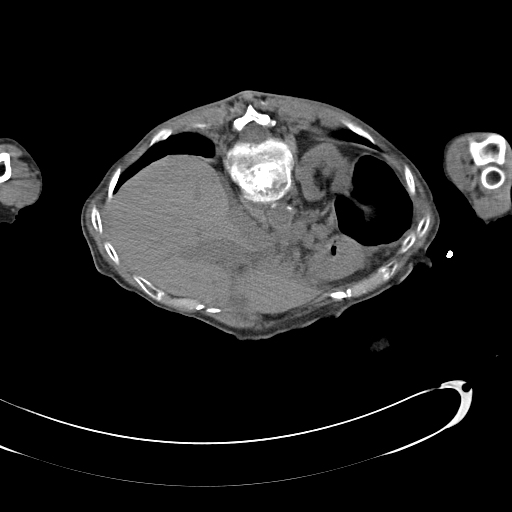
[im 27/36  lung]
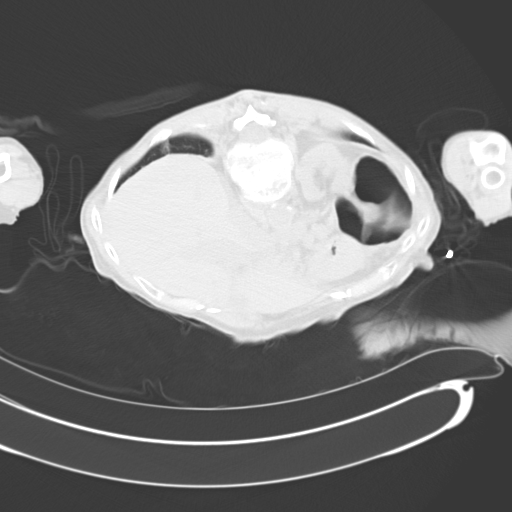

[Series 7: rt.renalcryoabl · axial · 0.30mm/px · z∈[-142,-52]mm · 3 of 36 slices shown (2 of 2)]
[im 9/36  soft-tissue]
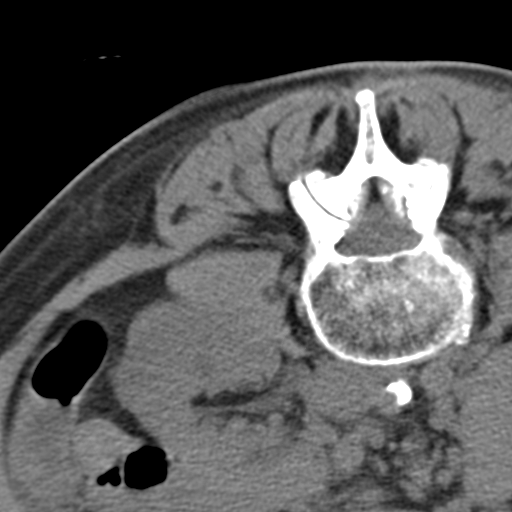
[im 18/36  soft-tissue]
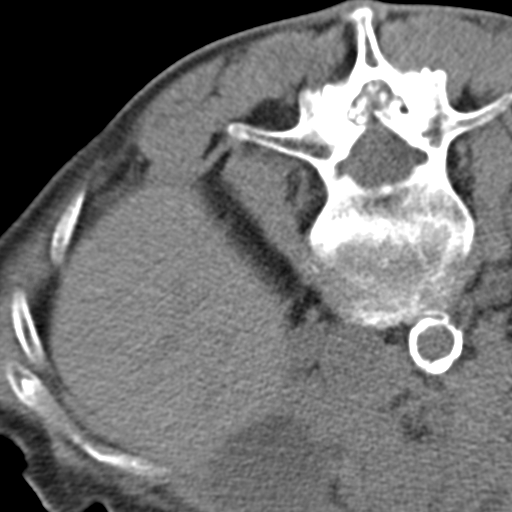
[im 27/36  soft-tissue]
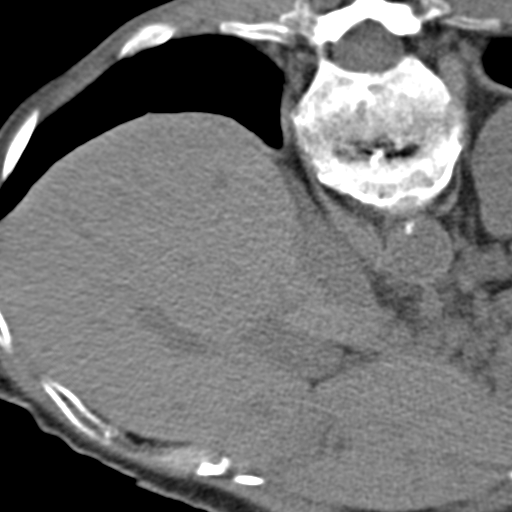

[8 of 32 positions shown; findings below may reference images not displayed]

EXAM:
CT-GUIDED PERCUTANEOUS CRYOABLATION OF RIGHT RENAL MASS

ANESTHESIA/SEDATION:
General

MEDICATIONS:
2 g IV Ancef. As antibiotic prophylaxis, Ancef was ordered
pre-procedure and administered intravenously within one hour of
incision.

CONTRAST:  None

PROCEDURE:
The procedure, risks, benefits, and alternatives were explained to
the patient. Questions regarding the procedure were encouraged and
answered. The patient understands and consents to the procedure.

The patient was placed under general anesthesia. Initial unenhanced
CT was performed in a prone position to localize the right renal
mass.

The right flank region was prepped with Betadine in a sterile
fashion, and a sterile drape was applied covering the operative
field. A sterile gown and sterile gloves were used for the
procedure. A time-out was performed prior to the procedure.

Under CT guidance, 2 separate Galil Ice Sphere percutaneous
cryoablation probes were advanced into the right renal mass. Probe
positioning was confirmed by CT prior to cryoablation.
Hydrodissection was performed lateral to the right kidney via a 22
gauge spinal needle. A mixture of 3 mL of Omnipaque 300 contrast and
200 mL of sterile saline was made. A total volume of the 90 mL of
this diluted contrast mixture was injected during hydrodissection.

Cryoablation was performed through the 2 probes simultaneously.
Initial 10 minute cycle of cryoablation was performed. This was
followed by a thaw cycle. A second 10 minute cycle of cryoablation
was then performed. During ablation, periodic CT imaging was
performed to monitor ice ball formation and morphology. After active
thaw, the cryoablation probes were removed.

COMPLICATIONS:
None
FINDINGS: The exophytic posterolateral tumor of the mid right kidney was
localized, measuring roughly 2.1 cm in greatest diameter. After
positioning of both cryoablation probes as well as hydrodissection
to move the ascending colon away from the renal lesion, there was no
room available to advance an additional biopsy needle safely. Biopsy
was therefore not performed and primary treatment was performed of
the lesion.

Hydrodissection was successful in displacing the colon and allowing
safe ablation. During cryoablation, monitoring CT demonstrates
adequate ice ball formation encompassing the renal tumor.
IMPRESSION: CT guided percutaneous cryoablation of right renal mass. The patient
will be observed overnight. Initial follow-up will be performed in
approximately 2 months.

## 2016-01-15 ENCOUNTER — Other Ambulatory Visit (INDEPENDENT_AMBULATORY_CARE_PROVIDER_SITE_OTHER): Payer: MEDICARE

## 2016-01-15 DIAGNOSIS — E785 Hyperlipidemia, unspecified: Secondary | ICD-10-CM

## 2016-01-15 LAB — LIPID PANEL
CHOL/HDL RATIO: 3
Cholesterol: 171 mg/dL (ref 0–200)
HDL: 56.3 mg/dL (ref >39.00)
LDL CALC: 101 mg/dL — AB (ref 0–99)
NONHDL: 115.09
Triglycerides: 70 mg/dL (ref 0.0–149.0)
VLDL: 14 mg/dL (ref 0.0–40.0)

## 2016-01-15 LAB — COMPREHENSIVE METABOLIC PANEL
ALK PHOS: 55 U/L (ref 39–117)
ALT: 31 U/L (ref 0–35)
AST: 28 U/L (ref 0–37)
Albumin: 3.5 g/dL (ref 3.5–5.2)
BUN: 14 mg/dL (ref 6–23)
CALCIUM: 8.9 mg/dL (ref 8.4–10.5)
CHLORIDE: 110 meq/L (ref 96–112)
CO2: 25 mEq/L (ref 19–32)
Creatinine, Ser: 1.02 mg/dL (ref 0.40–1.20)
GFR: 54.65 mL/min — AB (ref >60.00)
Glucose, Bld: 84 mg/dL (ref 70–99)
Potassium: 3.7 mEq/L (ref 3.5–5.1)
Sodium: 141 mEq/L (ref 135–145)
Total Bilirubin: 0.3 mg/dL (ref 0.2–1.2)
Total Protein: 5.7 g/dL — ABNORMAL LOW (ref 6.0–8.3)

## 2016-02-20 DIAGNOSIS — L300 Nummular dermatitis: Secondary | ICD-10-CM | POA: Diagnosis not present

## 2016-02-20 DIAGNOSIS — D1801 Hemangioma of skin and subcutaneous tissue: Secondary | ICD-10-CM | POA: Diagnosis not present

## 2016-02-20 DIAGNOSIS — L245 Irritant contact dermatitis due to other chemical products: Secondary | ICD-10-CM | POA: Diagnosis not present

## 2016-02-20 DIAGNOSIS — L821 Other seborrheic keratosis: Secondary | ICD-10-CM | POA: Diagnosis not present

## 2016-02-28 ENCOUNTER — Other Ambulatory Visit: Payer: Self-pay

## 2016-02-28 DIAGNOSIS — M159 Polyosteoarthritis, unspecified: Secondary | ICD-10-CM

## 2016-02-28 DIAGNOSIS — F419 Anxiety disorder, unspecified: Secondary | ICD-10-CM

## 2016-02-28 MED ORDER — TRAMADOL HCL 50 MG PO TABS: 50.0000 mg | ORAL_TABLET | Freq: Four times a day (QID) | ORAL | Status: DC | PRN

## 2016-02-28 MED ORDER — ALPRAZOLAM 0.5 MG PO TABS: ORAL_TABLET | ORAL | Status: DC

## 2016-02-28 NOTE — Telephone Encounter (Signed)
Last seen 09/27/15 and filled 11/30/15 To [facility]  Xanax #180 Tramadol #270 UDS 06/07/13 low risk   Please advise    KP

## 2016-03-19 ENCOUNTER — Ambulatory Visit (INDEPENDENT_AMBULATORY_CARE_PROVIDER_SITE_OTHER): Payer: MEDICARE | Admitting: Medical

## 2016-03-19 ENCOUNTER — Encounter: Payer: Self-pay | Admitting: Medical

## 2016-03-19 ENCOUNTER — Encounter (HOSPITAL_BASED_OUTPATIENT_CLINIC_OR_DEPARTMENT_OTHER): Payer: Self-pay

## 2016-03-19 ENCOUNTER — Emergency Department (HOSPITAL_BASED_OUTPATIENT_CLINIC_OR_DEPARTMENT_OTHER): Payer: MEDICARE

## 2016-03-19 ENCOUNTER — Emergency Department (HOSPITAL_BASED_OUTPATIENT_CLINIC_OR_DEPARTMENT_OTHER)
Admission: EM | Admit: 2016-03-19 | Discharge: 2016-03-19 | Disposition: A | Discharge status: Home / Self Care | Payer: MEDICARE | Attending: Emergency Medicine | Admitting: Emergency Medicine

## 2016-03-19 VITALS — BP 77/40 | HR 58 | Temp 97.4°F | Ht 63.0 in | Wt 84.0 lb

## 2016-03-19 DIAGNOSIS — R031 Nonspecific low blood-pressure reading: Secondary | ICD-10-CM | POA: Diagnosis present

## 2016-03-19 DIAGNOSIS — I252 Old myocardial infarction: Secondary | ICD-10-CM | POA: Insufficient documentation

## 2016-03-19 DIAGNOSIS — I2510 Atherosclerotic heart disease of native coronary artery without angina pectoris: Secondary | ICD-10-CM | POA: Diagnosis not present

## 2016-03-19 DIAGNOSIS — E785 Hyperlipidemia, unspecified: Secondary | ICD-10-CM | POA: Insufficient documentation

## 2016-03-19 DIAGNOSIS — R110 Nausea: Secondary | ICD-10-CM | POA: Insufficient documentation

## 2016-03-19 DIAGNOSIS — R5383 Other fatigue: Secondary | ICD-10-CM | POA: Diagnosis not present

## 2016-03-19 DIAGNOSIS — Z87891 Personal history of nicotine dependence: Secondary | ICD-10-CM | POA: Diagnosis not present

## 2016-03-19 DIAGNOSIS — F329 Major depressive disorder, single episode, unspecified: Secondary | ICD-10-CM | POA: Diagnosis not present

## 2016-03-19 DIAGNOSIS — E860 Dehydration: Secondary | ICD-10-CM | POA: Diagnosis not present

## 2016-03-19 DIAGNOSIS — M810 Age-related osteoporosis without current pathological fracture: Secondary | ICD-10-CM | POA: Diagnosis not present

## 2016-03-19 DIAGNOSIS — E039 Hypothyroidism, unspecified: Secondary | ICD-10-CM | POA: Insufficient documentation

## 2016-03-19 DIAGNOSIS — R531 Weakness: Secondary | ICD-10-CM | POA: Diagnosis not present

## 2016-03-19 LAB — URINE MICROSCOPIC-ADD ON

## 2016-03-19 LAB — COMPREHENSIVE METABOLIC PANEL WITH GFR
ALT: 48 U/L (ref 14–54)
AST: 57 U/L — ABNORMAL HIGH (ref 15–41)
Albumin: 3.6 g/dL (ref 3.5–5.0)
Alkaline Phosphatase: 65 U/L (ref 38–126)
Anion gap: 11 (ref 5–15)
BUN: 17 mg/dL (ref 6–20)
CO2: 22 mmol/L (ref 22–32)
Calcium: 8.9 mg/dL (ref 8.9–10.3)
Chloride: 107 mmol/L (ref 101–111)
Creatinine, Ser: 1.31 mg/dL — ABNORMAL HIGH (ref 0.44–1.00)
GFR calc Af Amer: 42 mL/min — ABNORMAL LOW
GFR calc non Af Amer: 36 mL/min — ABNORMAL LOW
Glucose, Bld: 92 mg/dL (ref 65–99)
Potassium: 3.5 mmol/L (ref 3.5–5.1)
Sodium: 140 mmol/L (ref 135–145)
Total Bilirubin: 0.5 mg/dL (ref 0.3–1.2)
Total Protein: 6.5 g/dL (ref 6.5–8.1)

## 2016-03-19 LAB — CBC WITH DIFFERENTIAL/PLATELET
Basophils Absolute: 0 K/uL (ref 0.0–0.1)
Basophils Relative: 0 %
Eosinophils Absolute: 0 K/uL (ref 0.0–0.7)
Eosinophils Relative: 1 %
HCT: 40 % (ref 36.0–46.0)
Hemoglobin: 13.1 g/dL (ref 12.0–15.0)
Lymphocytes Relative: 34 %
Lymphs Abs: 2.2 K/uL (ref 0.7–4.0)
MCH: 32 pg (ref 26.0–34.0)
MCHC: 32.8 g/dL (ref 30.0–36.0)
MCV: 97.8 fL (ref 78.0–100.0)
Monocytes Absolute: 0.6 K/uL (ref 0.1–1.0)
Monocytes Relative: 9 %
Neutro Abs: 3.5 K/uL (ref 1.7–7.7)
Neutrophils Relative %: 56 %
Platelets: 202 K/uL (ref 150–400)
RBC: 4.09 MIL/uL (ref 3.87–5.11)
RDW: 12.8 % (ref 11.5–15.5)
WBC: 6.3 K/uL (ref 4.0–10.5)

## 2016-03-19 LAB — URINALYSIS, ROUTINE W REFLEX MICROSCOPIC
BILIRUBIN URINE: NEGATIVE
Glucose, UA: NEGATIVE mg/dL
HGB URINE DIPSTICK: NEGATIVE
Ketones, ur: NEGATIVE mg/dL
NITRITE: NEGATIVE
PROTEIN: NEGATIVE mg/dL
Specific Gravity, Urine: 1.013 (ref 1.005–1.030)
pH: 5.5 (ref 5.0–8.0)

## 2016-03-19 LAB — TROPONIN I

## 2016-03-19 MED ORDER — SODIUM CHLORIDE 0.9 % IV BOLUS (SEPSIS)
1000.0000 mL | Freq: Once | INTRAVENOUS | Status: AC
  Administered 2016-03-19: 1000 mL via INTRAVENOUS

## 2016-03-19 NOTE — Progress Notes (Signed)
Subjective:    Patient ID: [name], female    DOB: 1928-05-15, 82 y.o.   MRN: [id number]  HPI   Pt in feeling fatigue.  Pt has chronic GI symptoms. Esophageal candidiasis chronic in nature. Hx of being getting dehydrated in the past at times. Then needing hospitalizations. 3 times in past here needed IV's through ED and then admission. Pt is baseline very thin.  This past week has not eaten much at all. Though pt states some fluid intake. Drinks water and Ginger ale.  Pt is here with daughter.  Daily abdomen pain but none right now. Abdomen CT in past did not show much except ovoid hyperdense focus near kidney  which has been ablating and appears to be decreasing in size.  Pt lives alone. 1/2 mile from daughter. Daughter checks couple of times a day.   Review of Systems  Constitutional: Negative for chills and fatigue.  Respiratory: Negative for cough, chest tightness, shortness of breath and wheezing.   Cardiovascular: Negative for chest pain and palpitations.  Gastrointestinal: Positive for nausea. Negative for abdominal distention, abdominal pain, constipation, diarrhea and vomiting.  Musculoskeletal: Negative for back pain.  Neurological: Positive for weakness. Negative for dizziness, seizures, speech difficulty, numbness and headaches.  Hematological: Negative for adenopathy. Does not bruise/bleed easily.  Psychiatric/Behavioral: Negative for behavioral problems and confusion.    Past Medical History:  Diagnosis Date  . Allergy   . Anemia   . Anxiety   . Cataract 05/2011  . Chronic low blood pressure    due to dehydration  . Depression   . Esophageal disorder   . Esophageal yeast infection ([place])   . GERD (gastroesophageal reflux disease)   . Hearing loss    hearing aids  . Hyperlipidemia   . Hypothyroidism   . Myocardial infarction ([place])   . Osteoporosis   . Renal cell carcinoma ([place])    found on CT  . Thyroid disease    hypothyroidism  . Transfusion  history    last 2004     Social History   Social History  . Marital status: Single    Spouse name: N/A  . Number of children: 2  . Years of education: N/A   Occupational History  . Retired Retired   Social History Main Topics  . Smoking status: Former Research scientist (life sciences)  . Smokeless tobacco: Never Used  . Alcohol use No  . Drug use: No  . Sexual activity: Not Currently   Other Topics Concern  . Not on file   Social History Narrative   REG EXERCISE   [employer]   LIVES ALONE   END OF LIFE:PATIENT HAS LIVING WILL AND CLEARLY STATES SHE DOES NOT WANT CARDIAC [place] VENTILATION OR OTHER HEROIC OR FUTILE MEASURES.    Past Surgical History:  Procedure Laterality Date  . ABDOMINAL HYSTERECTOMY    . APPENDECTOMY    . BASAL CELL CARCINOMA EXCISION    . COLONOSCOPY    . CORONARY ARTERY BYPASS GRAFT     x2 vessel bypass -Dr. Burt Knack -[facility] 06-22-14  . EYE SURGERY  05/2011  . KYPHOSIS SURGERY    . obstructed ureter     operative repair  . RENAL CRYOABLATION Right 09/2014  . TONSILLECTOMY    . TOTAL ABDOMINAL HYSTERECTOMY W/ BILATERAL SALPINGOOPHORECTOMY      Family History  Problem Relation Age of Onset  . Parkinsonism Father   . Cancer Other     breast 1st degree relative  . Hypertension  Other   . Heart disease Other     CAD.Marland Kitchen1ST DEGREE RELATIVE <60  . Hyperlipidemia Other   . Colon cancer Neg Hx   . Esophageal cancer Neg Hx   . Rectal cancer Neg Hx   . Stomach cancer Neg Hx     Allergies  Allergen Reactions  . Zofran [Ondansetron Hcl] Anaphylaxis and Other (See Comments)    Tongue swelling, lost of voice  . Clarithromycin     Pt does not remember the reaction  . Codeine     Per the pt, "It made me disoriented"  . Diazepam     Per the pt, "I don't remember the reaction"  . Sulfonamide Derivatives     Per the pt, "It made me disoriented"  . Prilosec [Omeprazole Magnesium] Rash    Current Outpatient Prescriptions on File Prior to Visit    Medication Sig Dispense Refill  . ALPRAZolam (XANAX) 0.5 MG tablet 1 po bid prn 180 tablet 0  . aspirin 81 MG EC tablet Take 81 mg by mouth daily.      . Cholecalciferol (VITAMIN D3) 1000 UNITS CAPS Take by mouth daily.      . ferrous fumarate (FERRETTS) 325 (106 FE) MG TABS Take 1 tablet by mouth daily.     . fluconazole (DIFLUCAN) 100 MG tablet Take 1 tablet (100 mg total) by mouth daily. 90 tablet 3  . fluticasone (FLONASE) 50 MCG/ACT nasal spray Place 2 sprays into both nostrils daily. (Patient taking differently: Place 2 sprays into both nostrils daily as needed for allergies. ) 16 g 6  . folic acid (FOLVITE) 1 MG tablet Take 1 mg by mouth daily.      . furosemide (LASIX) 40 MG tablet Take 1 tablet (40 mg total) by mouth daily as needed. 90 tablet 3  . levothyroxine (SYNTHROID, LEVOTHROID) 25 MCG tablet Take 1 tablet by mouth  daily 90 tablet 3  . levothyroxine (SYNTHROID, LEVOTHROID) 25 MCG tablet Take 1 tablet (25 mcg total) by mouth daily before breakfast. 90 tablet 3  . loratadine-pseudoephedrine (CLARITIN-D 24-HOUR) 10-240 MG per 24 hr tablet Take 1 tablet by mouth daily as needed for allergies.    Marland Kitchen metoCLOPramide (REGLAN) 10 MG tablet Take 1 tablet (10 mg total) by mouth at bedtime. 270 tablet 3  . Multiple Vitamin (MULTIVITAMIN WITH MINERALS) TABS tablet Take 1 tablet by mouth daily.    Marland Kitchen nystatin (MYCOSTATIN) 100000 UNIT/ML suspension Take 10 mLs (1,000,000 Units total) by mouth 4 (four) times daily. (Patient taking differently: Take 10 mLs by mouth See admin instructions. Pt soaks dentures in once every 10 days) 240 mL 6  . potassium chloride SA (K-DUR,KLOR-CON) 20 MEQ tablet Take 1 tablet (20 mEq total) by mouth 2 (two) times daily. 180 tablet 3  . promethazine (PHENERGAN) 25 MG tablet Take 1 tablet by mouth two  times daily 180 tablet 3  . ranitidine (ZANTAC) 150 MG tablet Take 150 mg by mouth 2 (two) times daily.    Marland Kitchen rOPINIRole (REQUIP) 1 MG tablet Take 1 tablet (1 mg total) by  mouth at bedtime. 90 tablet 3  . simvastatin (ZOCOR) 20 MG tablet Take 1 tablet by mouth  daily 90 tablet 1  . tiZANidine (ZANAFLEX) 2 MG tablet Take by mouth every 3 (three) hours as needed for muscle spasms.     . traMADol (ULTRAM) 50 MG tablet Take 1 tablet (50 mg total) by mouth every 6 (six) hours as needed. 270 tablet 0   No  current facility-administered medications on file prior to visit.     BP (!) 77/40 Comment: lying down  Pulse (!) 58   Temp 97.4 F (36.3 C) (Oral)   Ht 5\' 3"  (1.6 m)   Wt 84 lb (38.1 kg)   SpO2 92%   BMI 14.88 kg/m       Objective:   Physical Exam    General Mental Status- Alert. General Appearance- Not in acute distress but appears frail and weak. Difficulty standing/unstable.  Skin General: Color- Normal Color. Moisture- skin tents some on forearm.    Chest and Lung Exam Auscultation: Breath Sounds:-Normal.  Cardiovascular Auscultation:Rythm- Regular. Murmurs & Other Heart Sounds:Auscultation of the heart reveals- No Murmurs.  Abdomen Inspection:-Inspeection Normal. Palpation/Percussion:Note:No mass. Palpation and Percussion of the abdomen reveal- Non Tender, Non Distended + BS, no rebound or guarding.  Neurologic Cranial Nerve exam:- CN III-XII intact(No nystagmus), symmetric smile. Strength:- 5/5 equal and symmetric strength both upper and lower extremities.       Assessment & Plan:  For your severe fatigue and likely dehydration I do think you need evaluation in ED. IV hydration and labs as well to assess status. With your history you may need admission. I have talked with MD in the ED and notified her of your recent condition. Please go downstairs now to ED. Follow up with [place] as determined by ED or hospital.  Mackie Pai, PA-C

## 2016-03-19 NOTE — ED Notes (Signed)
Report given to Taryn, RN.

## 2016-03-19 NOTE — ED Triage Notes (Signed)
Patient was seen by PCP upstairs and BP was 77/40 and believes patient to be dehydrated.  Patient reports feeling weak and having n/v several days ago but nausea has got better.  Denies any other symptoms.

## 2016-03-19 NOTE — Patient Instructions (Addendum)
For your severe fatigue and likely dehydration I do think you need evaluation in ED. IV hydration and labs as well to assess status. With your history you may need admission. I have talked with MD in the ED and notified her of your recent condition. Please go downstairs now to ED. Follow up with [place] as determined by ED or hospital.

## 2016-03-19 NOTE — ED Provider Notes (Signed)
[facility] DEPT [facility] Provider Note   CSN: [id number] Arrival date & time: 03/19/16  [id number]  First Provider Contact:  First MD Initiated Contact with Patient 03/19/16 1004        History   Chief Complaint Chief Complaint  Patient presents with  . Hypotension    HPI [name] is a 82 y.o. female.  Pt was sent here by her pcp this morning due to fatigue, weakness, and low bp (sbp 70s-80s) at his office.  The pt said that she has not wanted to eat anything because she's been nauseous.  She has been able to drink.  Pt said that the nausea has improved now.  She denies any pain.  Pt lives alone, but daughter lives nearby and visits frequently.  Pt is very thin, but said that she's always been that way.  No recent weight changes.   The history is provided by the patient and a relative.    Past Medical History:  Diagnosis Date  . Allergy   . Anemia   . Anxiety   . Cataract 05/2011  . Chronic low blood pressure    due to dehydration  . Depression   . Esophageal disorder   . Esophageal yeast infection ([place])   . GERD (gastroesophageal reflux disease)   . Hearing loss    hearing aids  . Hyperlipidemia   . Hypothyroidism   . Myocardial infarction ([place])   . Osteoporosis   . Renal cell carcinoma ([place])    found on CT  . Thyroid disease    hypothyroidism  . Transfusion history    last 2004    Patient Active Problem List   Diagnosis Date Noted  . Right renal mass   . Suspicious mole 06/06/2013  . Allergic drug reaction 07/23/2012  . RLS (restless legs syndrome) 05/31/2012  . Insomnia 05/26/2011  . Osteopenia 05/26/2011  . HYPOTHYROIDISM 05/10/2008  . HYPERCALCEMIA 03/16/2008  . NONSPECIFIC ABNORM RESULTS KIDNEY FUNCTION STUDY 03/16/2008  . UTI 03/06/2008  . WEAKNESS 03/06/2008  . HYPERLIPIDEMIA 04/12/2007  . ARTIFICIAL MENOPAUSE 04/12/2007  . ANXIETY 01/12/2007  . DEPRESSION 01/12/2007  . MYOCARDIAL INFARCTION, HX OF 01/12/2007    Past Surgical History:    Procedure Laterality Date  . ABDOMINAL HYSTERECTOMY    . APPENDECTOMY    . BASAL CELL CARCINOMA EXCISION    . COLONOSCOPY    . CORONARY ARTERY BYPASS GRAFT     x2 vessel bypass -Dr. Burt Knack -[facility] 06-22-14  . EYE SURGERY  05/2011  . KYPHOSIS SURGERY    . obstructed ureter     operative repair  . RENAL CRYOABLATION Right 09/2014  . TONSILLECTOMY    . TOTAL ABDOMINAL HYSTERECTOMY W/ BILATERAL SALPINGOOPHORECTOMY      OB History    No data available       Home Medications    Prior to Admission medications   Medication Sig Start Date End Date Taking? Authorizing Provider  ALPRAZolam Duanne Moron) 0.5 MG tablet 1 po bid prn 02/28/16   Mosie Lukes, MD  aspirin 81 MG EC tablet Take 81 mg by mouth daily.      Historical Provider, MD  Cholecalciferol (VITAMIN D3) 1000 UNITS CAPS Take by mouth daily.      Historical Provider, MD  ferrous fumarate (FERRETTS) 325 (106 FE) MG TABS Take 1 tablet by mouth daily.     Historical Provider, MD  fluconazole (DIFLUCAN) 100 MG tablet Take 1 tablet (100 mg total) by mouth daily. 09/27/15  Alferd Apa Lowne Chase, DO  fluticasone (FLONASE) 50 MCG/ACT nasal spray Place 2 sprays into both nostrils daily. Patient taking differently: Place 2 sprays into both nostrils daily as needed for allergies.  09/07/13   Rosalita Chessman Chase, DO  folic acid (FOLVITE) 1 MG tablet Take 1 mg by mouth daily.      Historical Provider, MD  furosemide (LASIX) 40 MG tablet Take 1 tablet (40 mg total) by mouth daily as needed. 07/04/15   Rosalita Chessman Chase, DO  levothyroxine (SYNTHROID, LEVOTHROID) 25 MCG tablet Take 1 tablet by mouth  daily 08/11/14   Ann Held, DO  levothyroxine (SYNTHROID, LEVOTHROID) 25 MCG tablet Take 1 tablet (25 mcg total) by mouth daily before breakfast. 09/27/15   Rosalita Chessman Chase, DO  loratadine-pseudoephedrine (CLARITIN-D 24-HOUR) 10-240 MG per 24 hr tablet Take 1 tablet by mouth daily as needed for allergies.    Historical Provider, MD   metoCLOPramide (REGLAN) 10 MG tablet Take 1 tablet (10 mg total) by mouth at bedtime. 07/04/15   Rosalita Chessman Chase, DO  Multiple Vitamin (MULTIVITAMIN WITH MINERALS) TABS tablet Take 1 tablet by mouth daily.    Historical Provider, MD  nystatin (MYCOSTATIN) 100000 UNIT/ML suspension Take 10 mLs (1,000,000 Units total) by mouth 4 (four) times daily. Patient taking differently: Take 10 mLs by mouth See admin instructions. Pt soaks dentures in once every 10 days 03/24/14   Milus Banister, MD  potassium chloride SA (K-DUR,KLOR-CON) 20 MEQ tablet Take 1 tablet (20 mEq total) by mouth 2 (two) times daily. 07/04/15   Rosalita Chessman Chase, DO  promethazine (PHENERGAN) 25 MG tablet Take 1 tablet by mouth two  times daily 09/27/15   Rosalita Chessman Chase, DO  ranitidine (ZANTAC) 150 MG tablet Take 150 mg by mouth 2 (two) times daily.    Historical Provider, MD  rOPINIRole (REQUIP) 1 MG tablet Take 1 tablet (1 mg total) by mouth at bedtime. 07/04/15   Rosalita Chessman Chase, DO  simvastatin (ZOCOR) 20 MG tablet Take 1 tablet by mouth  daily 11/30/15   Rosalita Chessman Chase, DO  tiZANidine (ZANAFLEX) 2 MG tablet Take by mouth every 3 (three) hours as needed for muscle spasms.     Historical Provider, MD  traMADol (ULTRAM) 50 MG tablet Take 1 tablet (50 mg total) by mouth every 6 (six) hours as needed. 02/28/16   Mosie Lukes, MD    Family History Family History  Problem Relation Age of Onset  . Parkinsonism Father   . Cancer Other     breast 1st degree relative  . Hypertension Other   . Heart disease Other     CAD.Marland Kitchen1ST DEGREE RELATIVE <60  . Hyperlipidemia Other   . Colon cancer Neg Hx   . Esophageal cancer Neg Hx   . Rectal cancer Neg Hx   . Stomach cancer Neg Hx     Social History Social History  Substance Use Topics  . Smoking status: Former Research scientist (life sciences)  . Smokeless tobacco: Never Used  . Alcohol use No     Allergies   Zofran [ondansetron hcl]; Clarithromycin; Codeine; Diazepam; Sulfonamide  derivatives; and Prilosec [omeprazole magnesium]   Review of Systems Review of Systems  Constitutional: Positive for appetite change.  Gastrointestinal: Positive for nausea.  Neurological: Positive for weakness.  All other systems reviewed and are negative.    Physical Exam Updated Vital Signs BP 110/63 (BP Location: Right Arm)   Pulse 83  Temp 97.9 F (36.6 C) (Oral)   Resp 12   Ht 5\' 3"  (1.6 m)   Wt 84 lb (38.1 kg)   SpO2 100%   BMI 14.88 kg/m   Physical Exam  Constitutional: She is oriented to person, place, and time. She appears well-developed. She appears cachectic.  HENT:  Head: Normocephalic and atraumatic.  Right Ear: External ear normal.  Left Ear: External ear normal.  Nose: Nose normal.  Mouth/Throat: Oropharynx is clear and moist.  Eyes: Conjunctivae and EOM are normal. Pupils are equal, round, and reactive to light.  Neck: Normal range of motion. Neck supple.  Cardiovascular: Normal rate, regular rhythm, normal heart sounds and intact distal pulses.   Pulmonary/Chest: Effort normal and breath sounds normal.  Abdominal: Soft. Bowel sounds are normal.  Musculoskeletal: Normal range of motion.  Neurological: She is alert and oriented to person, place, and time.  Skin: Skin is warm and dry.  Psychiatric: She has a normal mood and affect. Her behavior is normal. Judgment and thought content normal.  Nursing note and vitals reviewed.    ED Treatments / Results  Labs (all labs ordered are listed, but only abnormal results are displayed) Labs Reviewed  COMPREHENSIVE METABOLIC PANEL - Abnormal; Notable for the following:       Result Value   Creatinine, Ser 1.31 (*)    AST 57 (*)    GFR calc non Af Amer 36 (*)    GFR calc Af Amer 42 (*)    All other components within normal limits  URINALYSIS, ROUTINE W REFLEX MICROSCOPIC (NOT AT [facility]) - Abnormal; Notable for the following:    Leukocytes, UA TRACE (*)    All other components within normal limits  URINE  MICROSCOPIC-ADD ON - Abnormal; Notable for the following:    Squamous Epithelial / LPF 0-5 (*)    Bacteria, UA FEW (*)    Casts HYALINE CASTS (*)    Crystals CA OXALATE CRYSTALS (*)    All other components within normal limits  CBC WITH DIFFERENTIAL/PLATELET  TROPONIN I    EKG  EKG Interpretation  Date/Time:  Wednesday March 19 2016 10:12:53 EDT Ventricular Rate:  79 PR Interval:    QRS Duration: 84 QT Interval:  415 QTC Calculation: 476 R Axis:   76 Text Interpretation:  Sinus rhythm Confirmed by Gilford Raid MD, Madison Albea ([id number]) on 03/19/2016 10:20:01 AM Also confirmed by Gilford Raid MD, Kyran Connaughton [phone]), editor [place] CT, Leda Gauze [id number])  on 03/19/2016 12:00:17 PM       Radiology Dg Chest 2 View  Result Date: 03/19/2016 CLINICAL DATA:  Hypotension. Dehydration. Weakness. Nausea vomiting. EXAM: CHEST  2 VIEW COMPARISON:  Chest x-rays dated 05/04/2015 and 07/23/2012 FINDINGS: The heart size and pulmonary vascularity are normal and the lungs are clear. No effusions. Prior median sternotomy. Slight thoracolumbar scoliosis. Aortic atherosclerosis. Old compression fracture in the upper lumbar spine. IMPRESSION: No active cardiopulmonary disease. Electronically Signed   By: Lorriane Shire M.D.   On: 03/19/2016 11:56   Procedures Procedures (including critical care time)  Medications Ordered in ED Medications  sodium chloride 0.9 % bolus 1,000 mL (0 mLs Intravenous Stopped 03/19/16 1212)     Initial Impression / Assessment and Plan / ED Course  I have reviewed the triage vital signs and the nursing notes.  Pertinent labs & imaging results that were available during my care of the patient were reviewed by me and considered in my medical decision making (see chart for details).  Clinical Course  Pt is feeling much better. BP is good.  Renal insufficiency is chronic.   She is able to ambulate.  She is hungry and ready to go.  Final Clinical Impressions(s) / ED Diagnoses   Final diagnoses:    Dehydration    New Prescriptions New Prescriptions   No medications on file     Isla Pence, MD 03/19/16 1244

## 2016-03-19 NOTE — ED Notes (Signed)
EMT helped pt to the bathroom to retrieve urine sample into a hat. Pt attempted, but the urine sample was contaminated with stool.

## 2016-03-19 NOTE — ED Notes (Signed)
Patient has been diagnosed with chronic yeast infection in throat.  Family reports patient eats very little if anything.  Patient reports she drinks plenty.

## 2016-03-19 NOTE — ED Notes (Signed)
Daughter reports patient is nearly anorexic.

## 2016-03-19 NOTE — Progress Notes (Signed)
Pre visit review using our clinic review tool, if applicable. No additional management support is needed unless otherwise documented below in the visit note. 

## 2016-04-17 DIAGNOSIS — H40013 Open angle with borderline findings, low risk, bilateral: Secondary | ICD-10-CM | POA: Diagnosis not present

## 2016-04-28 ENCOUNTER — Other Ambulatory Visit: Payer: Self-pay | Admitting: Family Medicine

## 2016-04-28 DIAGNOSIS — B3781 Candidal esophagitis: Secondary | ICD-10-CM

## 2016-04-28 DIAGNOSIS — I10 Essential (primary) hypertension: Secondary | ICD-10-CM

## 2016-04-28 DIAGNOSIS — B370 Candidal stomatitis: Secondary | ICD-10-CM

## 2016-04-28 DIAGNOSIS — E039 Hypothyroidism, unspecified: Secondary | ICD-10-CM

## 2016-04-28 DIAGNOSIS — G2581 Restless legs syndrome: Secondary | ICD-10-CM

## 2016-04-29 ENCOUNTER — Telehealth: Payer: Self-pay

## 2016-04-29 DIAGNOSIS — M159 Polyosteoarthritis, unspecified: Secondary | ICD-10-CM

## 2016-04-29 DIAGNOSIS — F419 Anxiety disorder, unspecified: Secondary | ICD-10-CM

## 2016-04-29 NOTE — Telephone Encounter (Signed)
Diflucan requested.  Last seen 09/27/15 and filled 09/27/15 #90    Please advise     KP

## 2016-04-29 NOTE — Telephone Encounter (Signed)
Refill x1 each 

## 2016-04-29 NOTE — Telephone Encounter (Signed)
Last seen by PCP on 09/27/15 and filled   Tramadol 02/28/16 #270 Xanax 02/28/16 #180 NO UDS   Please advise    KP

## 2016-04-30 MED ORDER — TRAMADOL HCL 50 MG PO TABS: 50.0000 mg | ORAL_TABLET | Freq: Four times a day (QID) | ORAL | 0 refills | Status: DC | PRN

## 2016-04-30 MED ORDER — ALPRAZOLAM 0.5 MG PO TABS: ORAL_TABLET | ORAL | 0 refills | Status: DC

## 2016-04-30 NOTE — Telephone Encounter (Signed)
Rx will be faxed in the morning.      KP

## 2016-05-08 ENCOUNTER — Telehealth: Payer: Self-pay | Admitting: Family Medicine

## 2016-05-08 DIAGNOSIS — E039 Hypothyroidism, unspecified: Secondary | ICD-10-CM

## 2016-05-08 DIAGNOSIS — E785 Hyperlipidemia, unspecified: Secondary | ICD-10-CM

## 2016-05-08 NOTE — Telephone Encounter (Signed)
Daughter has been made aware and verbalized understanding, she has agreed to take her to [facility] to have her labs done. Orders are in.    KP

## 2016-05-08 NOTE — Telephone Encounter (Signed)
We should at least labs done ---- because  Of meds she is on

## 2016-05-08 NOTE — Telephone Encounter (Signed)
Please advise      KP 

## 2016-05-08 NOTE — Telephone Encounter (Addendum)
Caller name: [name]  Relation to pt: daughter  Call back number: [phone]    Reason for call:   Patient was last seen 09/27/2015 as per AVS please return (around 03/26/2016), or if symptoms worsen or fail to improve, for hypertension, hyperlipidemia. Daughter states patient is doing fine and well and would like to know if medications can be refilled for a year. Daughter stated PCP typically refills a year out and if patient needs to be seen she just wants to know why; daughter stated you can leave a detailed message or my chart.

## 2016-05-19 ENCOUNTER — Other Ambulatory Visit (INDEPENDENT_AMBULATORY_CARE_PROVIDER_SITE_OTHER): Payer: MEDICARE

## 2016-05-19 DIAGNOSIS — E039 Hypothyroidism, unspecified: Secondary | ICD-10-CM | POA: Diagnosis not present

## 2016-05-19 DIAGNOSIS — E785 Hyperlipidemia, unspecified: Secondary | ICD-10-CM | POA: Diagnosis not present

## 2016-05-19 LAB — COMPREHENSIVE METABOLIC PANEL
ALBUMIN: 3.5 g/dL (ref 3.5–5.2)
ALK PHOS: 71 U/L (ref 39–117)
ALT: 37 U/L — ABNORMAL HIGH (ref 0–35)
AST: 30 U/L (ref 0–37)
BUN: 17 mg/dL (ref 6–23)
CALCIUM: 8.7 mg/dL (ref 8.4–10.5)
CO2: 25 mEq/L (ref 19–32)
CREATININE: 1.1 mg/dL (ref 0.40–1.20)
Chloride: 108 mEq/L (ref 96–112)
GFR: 50.05 mL/min — ABNORMAL LOW (ref >60.00)
Glucose, Bld: 82 mg/dL (ref 70–99)
Potassium: 4.2 mEq/L (ref 3.5–5.1)
SODIUM: 139 meq/L (ref 135–145)
TOTAL PROTEIN: 6.1 g/dL (ref 6.0–8.3)
Total Bilirubin: 0.4 mg/dL (ref 0.2–1.2)

## 2016-05-19 LAB — TSH: TSH: 2.29 u[IU]/mL (ref 0.35–4.50)

## 2016-05-19 LAB — LIPID PANEL
CHOLESTEROL: 162 mg/dL (ref 0–200)
HDL: 69.3 mg/dL (ref >39.00)
LDL CALC: 73 mg/dL (ref 0–99)
NonHDL: 92.51
Total CHOL/HDL Ratio: 2
Triglycerides: 98 mg/dL (ref 0.0–149.0)
VLDL: 19.6 mg/dL (ref 0.0–40.0)

## 2016-05-27 ENCOUNTER — Encounter: Payer: Self-pay | Admitting: Family Medicine

## 2016-07-28 ENCOUNTER — Other Ambulatory Visit: Payer: Self-pay | Admitting: Family Medicine

## 2016-07-28 DIAGNOSIS — I10 Essential (primary) hypertension: Secondary | ICD-10-CM

## 2016-07-28 DIAGNOSIS — R110 Nausea: Secondary | ICD-10-CM

## 2016-07-28 DIAGNOSIS — G2581 Restless legs syndrome: Secondary | ICD-10-CM

## 2016-07-28 NOTE — Telephone Encounter (Signed)
eScribe request from [facility] for refill on: Furosemide, Reglan, Requip, Potassium, Simvastatin Last filled - 04/29/16 [all] Last AEX - 09/27/15 Next AEX - 6-Mths [name] had Acute w/other provider] Refill sent per [facility] refill protocol/SLS  Please call patient and schedule F/U office visit prior to future refills/SLS 12/04

## 2016-07-30 NOTE — Telephone Encounter (Signed)
Left message on VM @ [id number] for patient to call and schedule OV prior to any future refills

## 2016-08-16 DIAGNOSIS — R5383 Other fatigue: Secondary | ICD-10-CM | POA: Diagnosis not present

## 2016-08-16 DIAGNOSIS — J069 Acute upper respiratory infection, unspecified: Secondary | ICD-10-CM | POA: Diagnosis not present

## 2016-08-27 ENCOUNTER — Encounter: Payer: Self-pay | Admitting: Interventional Radiology

## 2016-09-03 ENCOUNTER — Ambulatory Visit (INDEPENDENT_AMBULATORY_CARE_PROVIDER_SITE_OTHER): Payer: MEDICARE | Admitting: Cardiovascular Disease

## 2016-09-03 ENCOUNTER — Encounter: Payer: Self-pay | Admitting: Cardiovascular Disease

## 2016-09-03 ENCOUNTER — Encounter: Payer: Self-pay | Admitting: Family Medicine

## 2016-09-03 VITALS — BP 114/74 | HR 88 | Ht 63.0 in | Wt 84.0 lb

## 2016-09-03 DIAGNOSIS — E785 Hyperlipidemia, unspecified: Secondary | ICD-10-CM

## 2016-09-03 DIAGNOSIS — I2510 Atherosclerotic heart disease of native coronary artery without angina pectoris: Secondary | ICD-10-CM | POA: Diagnosis not present

## 2016-09-03 DIAGNOSIS — M810 Age-related osteoporosis without current pathological fracture: Secondary | ICD-10-CM | POA: Diagnosis not present

## 2016-09-03 NOTE — Progress Notes (Signed)
Cardiology Office Note Date:  09/03/2016   ID:  [name], [name] [birth year], MRN [id number]  PCP:  Ann Held, DO  Cardiologist:  Sherren Mocha, MD    Chief Complaint  Patient presents with  . Coronary Artery Disease     History of Present Illness: [name] is a 83 y.o. female who presents for Follow-up of coronary artery disease. The patient underwent multivessel CABG in 2004.  The patient is here with her daughter today. She's had bronchitis and has taken a course of antibiotics. She is no longer coughing but feels weak. She's only been out of the house to go to doctor's appointments the last month. She has not had any cardiac related problems and specifically denies chest pain, heart palpitations, or leg swelling.  Past Medical History:  Diagnosis Date  . Allergy   . Anemia   . Anxiety   . Cataract 05/2011  . Chronic low blood pressure    due to dehydration  . Depression   . Esophageal disorder   . Esophageal yeast infection ([place])   . GERD (gastroesophageal reflux disease)   . Hearing loss    hearing aids  . Hyperlipidemia   . Hypothyroidism   . Myocardial infarction   . Osteoporosis   . Renal cell carcinoma ([place])    found on CT  . Thyroid disease    hypothyroidism  . Transfusion history    last 2004    Past Surgical History:  Procedure Laterality Date  . ABDOMINAL HYSTERECTOMY    . APPENDECTOMY    . BASAL CELL CARCINOMA EXCISION    . COLONOSCOPY    . CORONARY ARTERY BYPASS GRAFT     x2 vessel bypass -Dr. Burt Knack -[facility] 06-22-14  . EYE SURGERY  05/2011  . IR GENERIC HISTORICAL  08/29/2014   IR RADIOLOGIST EVAL & MGMT 08/29/2014 Aletta Edouard, MD [facility] INTERV RAD  . KYPHOSIS SURGERY    . obstructed ureter     operative repair  . RENAL CRYOABLATION Right 09/2014  . TONSILLECTOMY    . TOTAL ABDOMINAL HYSTERECTOMY W/ BILATERAL SALPINGOOPHORECTOMY      Current Outpatient Prescriptions  Medication Sig Dispense Refill  .  ALPRAZolam (XANAX) 0.5 MG tablet Take 0.5 mg by mouth 2 (two) times daily as needed for anxiety.    Marland Kitchen aspirin 81 MG EC tablet Take 81 mg by mouth daily.      . Cholecalciferol (VITAMIN D3) 1000 UNITS CAPS Take 1,000 Units by mouth daily.     . ferrous fumarate (FERRETTS) 325 (106 FE) MG TABS Take 1 tablet by mouth daily.     . fluconazole (DIFLUCAN) 100 MG tablet Take 100 mg by mouth daily.    . fluticasone (FLONASE) 50 MCG/ACT nasal spray Place 2 sprays into both nostrils daily as needed for allergies or rhinitis.    . folic acid (FOLVITE) 1 MG tablet Take 1 mg by mouth daily.      . furosemide (LASIX) 40 MG tablet Take 40 mg by mouth daily as needed for fluid or edema.    Marland Kitchen levothyroxine (SYNTHROID, LEVOTHROID) 25 MCG tablet Take 1 tablet (25 mcg total) by mouth daily before breakfast. Office visit due now 90 tablet 0  . loratadine-pseudoephedrine (CLARITIN-D 24-HOUR) 10-240 MG per 24 hr tablet Take 1 tablet by mouth daily as needed for allergies.    Marland Kitchen metoCLOPramide (REGLAN) 10 MG tablet TAKE 1 TABLET BY MOUTH AT  BEDTIME 90 tablet 0  . Multiple  Vitamin (MULTIVITAMIN WITH MINERALS) TABS tablet Take 1 tablet by mouth daily.    Marland Kitchen nystatin (MYCOSTATIN) 100000 UNIT/ML suspension Take 10 mLs by mouth as directed. Patient uses 10 ml once weekly to soak dentures    . potassium chloride SA (K-DUR,KLOR-CON) 20 MEQ tablet TAKE 1 TABLET BY MOUTH TWO  TIMES DAILY 180 tablet 0  . promethazine (PHENERGAN) 25 MG tablet Take 1 tablet by mouth two  times daily 180 tablet 3  . ranitidine (ZANTAC) 150 MG tablet Take 150 mg by mouth 2 (two) times daily.    Marland Kitchen rOPINIRole (REQUIP) 1 MG tablet TAKE 1 TABLET BY MOUTH AT  BEDTIME 90 tablet 0  . simvastatin (ZOCOR) 20 MG tablet TAKE 1 TABLET BY MOUTH  DAILY 90 tablet 0  . tiZANidine (ZANAFLEX) 2 MG tablet Take 2 mg by mouth every 3 (three) hours as needed for muscle spasms.     . traMADol (ULTRAM) 50 MG tablet Take 50 mg by mouth every 6 (six) hours as needed (pain).       No current facility-administered medications for this visit.     Allergies:   Zofran [ondansetron hcl]; Clarithromycin; Codeine; Diazepam; Ondansetron; Sulfonamide derivatives; Prilosec [omeprazole magnesium]; and Trazodone   Social History:  The patient  reports that she has quit smoking. She has never used smokeless tobacco. She reports that she does not drink alcohol or use drugs.   Family History:  The patient's  family history includes Cancer in her other; Heart disease in her other; Hyperlipidemia in her other; Hypertension in her other; Parkinsonism in her father.    ROS:  Please see the history of present illness.   All other systems are reviewed and negative.    PHYSICAL EXAM: VS:  BP 114/74   Pulse 88   Ht 5\' 3"  (1.6 m)   Wt 84 lb (38.1 kg)   BMI 14.88 kg/m  , BMI Body mass index is 14.88 kg/m. GEN: Pleasant, frail elderly woman in no distress  HEENT: normal  Neck: no JVD, no masses. No carotid bruits Cardiac: RRR without murmur or gallop                Respiratory:  clear to auscultation bilaterally, normal work of breathing GI: soft, nontender, nondistended, + BS MS: no deformity or atrophy  Ext: no pretibial edema, pedal pulses 2+= bilaterally Skin: warm and dry, no rash Neuro:  Strength and sensation are intact Psych: euthymic mood, full affect  EKG:  EKG is ordered today. The ekg ordered today shows normal sinus rhythm 80 bpm, within normal limits.  Recent Labs: 03/19/2016: Hemoglobin 13.1; Platelets 202 05/19/2016: ALT 37; BUN 17; Creatinine, Ser 1.10; Potassium 4.2; Sodium 139; TSH 2.29   Lipid Panel     Component Value Date/Time   CHOL 162 05/19/2016 0734   TRIG 98.0 05/19/2016 0734   HDL 69.30 05/19/2016 0734   CHOLHDL 2 05/19/2016 0734   VLDL 19.6 05/19/2016 0734   LDLCALC 73 05/19/2016 0734   LDLDIRECT 165.1 05/10/2008 0000      Wt Readings from Last 3 Encounters:  09/03/16 84 lb (38.1 kg)  03/19/16 84 lb (38.1 kg)  03/19/16 84 lb (38.1  kg)     ASSESSMENT AND PLAN: 1.  CAD status post CABG: The patient remained stable with no symptoms of angina. She is tolerating low-dose aspirin without significant bleeding or bruising. I would be happy to see her back in one year or alternatively I could see her as needed.  2. Hyperlipidemia: The patient is treated with low dose simvastatin. Her most recent lipids are reviewed. She will continue this without changes. LFTs have been in range.  Current medicines are reviewed with the patient today.  The patient does not have concerns regarding medicines.  Labs/ tests ordered today include:   Orders Placed This Encounter  Procedures  . EKG 12-Lead    Disposition:   FU one year or prn if she prefers that  Signed, Sherren Mocha, MD  09/03/2016 9:06 AM    [facility] [address], [place], [place]  [address] Phone: [phone]; Fax: [phone]

## 2016-09-03 NOTE — Patient Instructions (Signed)

## 2016-09-24 ENCOUNTER — Telehealth: Payer: Self-pay | Admitting: *Deleted

## 2016-09-24 NOTE — Telephone Encounter (Signed)
Bone density showed osteoporosis.  Dr. Etter Sjogren wanted to know was she ever asked about Prolia?  Left message on machine to call back

## 2016-09-24 NOTE — Telephone Encounter (Signed)
Returned your call [phone]

## 2016-09-24 NOTE — Telephone Encounter (Signed)
Patient returned call and stated that you had talked about the prolia before but did not do it.  She would like to wait until her appointment 10/23/16 to talk about it then.  Dr. Jyl Heinz

## 2016-09-25 NOTE — Telephone Encounter (Signed)
noted 

## 2016-09-29 ENCOUNTER — Other Ambulatory Visit: Payer: Self-pay | Admitting: *Deleted

## 2016-09-29 ENCOUNTER — Other Ambulatory Visit (HOSPITAL_COMMUNITY): Payer: Self-pay | Admitting: Interventional Radiology

## 2016-09-29 DIAGNOSIS — N2889 Other specified disorders of kidney and ureter: Secondary | ICD-10-CM

## 2016-10-06 DIAGNOSIS — H40013 Open angle with borderline findings, low risk, bilateral: Secondary | ICD-10-CM | POA: Diagnosis not present

## 2016-10-06 DIAGNOSIS — H353132 Nonexudative age-related macular degeneration, bilateral, intermediate dry stage: Secondary | ICD-10-CM | POA: Diagnosis not present

## 2016-10-06 DIAGNOSIS — Z961 Presence of intraocular lens: Secondary | ICD-10-CM | POA: Diagnosis not present

## 2016-10-06 DIAGNOSIS — H26493 Other secondary cataract, bilateral: Secondary | ICD-10-CM | POA: Diagnosis not present

## 2016-10-07 ENCOUNTER — Other Ambulatory Visit: Payer: Self-pay

## 2016-10-18 ENCOUNTER — Other Ambulatory Visit (HOSPITAL_COMMUNITY): Payer: Self-pay | Admitting: Interventional Radiology

## 2016-10-18 DIAGNOSIS — N2889 Other specified disorders of kidney and ureter: Secondary | ICD-10-CM | POA: Diagnosis not present

## 2016-10-18 LAB — BUN: BUN: 10 mg/dL (ref 7–25)

## 2016-10-18 LAB — CREATININE WITH EST GFR
Creat: 1.03 mg/dL — ABNORMAL HIGH (ref 0.60–0.88)
GFR, EST AFRICAN AMERICAN: 57 mL/min — AB (ref >=60)
GFR, Est Non African American: 49 mL/min — ABNORMAL LOW (ref >=60)

## 2016-10-22 ENCOUNTER — Encounter (HOSPITAL_COMMUNITY): Payer: Self-pay

## 2016-10-22 ENCOUNTER — Telehealth: Payer: Self-pay | Admitting: *Deleted

## 2016-10-22 ENCOUNTER — Ambulatory Visit
Admission: RE | Admit: 2016-10-22 | Discharge: 2016-10-22 | Disposition: A | Discharge status: Home / Self Care | Payer: MEDICARE | Source: Ambulatory Visit | Attending: Interventional Radiology | Admitting: Interventional Radiology

## 2016-10-22 ENCOUNTER — Ambulatory Visit (HOSPITAL_COMMUNITY)
Admission: RE | Admit: 2016-10-22 | Discharge: 2016-10-22 | Disposition: A | Discharge status: Home / Self Care | Payer: MEDICARE | Source: Ambulatory Visit | Attending: Interventional Radiology | Admitting: Interventional Radiology

## 2016-10-22 DIAGNOSIS — K8020 Calculus of gallbladder without cholecystitis without obstruction: Secondary | ICD-10-CM | POA: Diagnosis not present

## 2016-10-22 DIAGNOSIS — N200 Calculus of kidney: Secondary | ICD-10-CM | POA: Diagnosis not present

## 2016-10-22 DIAGNOSIS — I700 Atherosclerosis of aorta: Secondary | ICD-10-CM | POA: Insufficient documentation

## 2016-10-22 DIAGNOSIS — N2889 Other specified disorders of kidney and ureter: Secondary | ICD-10-CM | POA: Diagnosis not present

## 2016-10-22 DIAGNOSIS — Z9889 Other specified postprocedural states: Secondary | ICD-10-CM | POA: Insufficient documentation

## 2016-10-22 HISTORY — PX: IR GENERIC HISTORICAL: IMG1180011

## 2016-10-22 MED ORDER — IOPAMIDOL (ISOVUE-300) INJECTION 61%
INTRAVENOUS | Status: AC
  Filled 2016-10-22: qty 100

## 2016-10-22 MED ORDER — IOPAMIDOL (ISOVUE-300) INJECTION 61%
100.0000 mL | Freq: Once | INTRAVENOUS | Status: AC | PRN
  Administered 2016-10-22: 80 mL via INTRAVENOUS

## 2016-10-22 NOTE — Progress Notes (Signed)
Subjective:   [name] is a 83 y.o. female who presents for Medicare Annual (Subsequent) preventive examination.  Review of Systems:  No ROS.  Medicare Wellness Visit.  Cardiac Risk Factors include: advanced age (>64men, >6 women);dyslipidemia  Sleep patterns: no sleep issues, gets up 1 times nightly to void and sleeps 5-6 hours nightly. Feels rested on waking.   Home Safety/Smoke Alarms: Feels safe in home. Smoke alarms in place.   Living environment; residence and Firearm Safety: Wears Scientist, research (medical). Lives alone. apartment, equipment: Kasandra Knudsen, Type: [place] [employer] and Walkers, Type: [place], no firearms.   Counseling:   Eye Exam- Dr. Herbert Deaner every 6 months Dental- Dr. Otila Kluver yearly. Partial plates lower, crowns upper.  Female:   Pap- N/A, hysterectomy/aged out       Mammo- last 11/28/13. BI-RADS Category 2: Benign        Dexa scan- last 09/03/16. Osteoporosis. >> Pt does not wish to treat, states she is allergic to most medications that would be considered.       CCS- Aged out.     Objective:     Vitals: BP (!) 102/52 (BP Location: Left Arm, Patient Position: Sitting, Cuff Size: Normal)   Pulse 84   Temp 97.8 F (36.6 C) (Oral)   Resp 16   Ht 5\' 3"  (1.6 m)   Wt 82 lb 9.6 oz (37.5 kg)   SpO2 97%   BMI 14.63 kg/m   Body mass index is 14.63 kg/m.  Wt Readings from Last 3 Encounters:  10/23/16 82 lb 9.6 oz (37.5 kg)  10/22/16 90 lb (40.8 kg)  09/03/16 84 lb (38.1 kg)    Tobacco History  Smoking Status  . Former Smoker  Smokeless Tobacco  . Never Used     Counseling given: Not Answered   Past Medical History:  Diagnosis Date  . Allergy   . Anemia   . Anxiety   . Cataract 05/2011  . Chronic low blood pressure    due to dehydration  . Depression   . Esophageal disorder   . Esophageal yeast infection ([place])   . GERD (gastroesophageal reflux disease)   . Hearing loss    hearing aids  . Hyperlipidemia   . Hypothyroidism   .  Myocardial infarction   . Osteoporosis   . Renal cell carcinoma ([place])    found on CT  . Thyroid disease    hypothyroidism  . Transfusion history    last 2004   Past Surgical History:  Procedure Laterality Date  . ABDOMINAL HYSTERECTOMY    . APPENDECTOMY    . BASAL CELL CARCINOMA EXCISION    . COLONOSCOPY    . CORONARY ARTERY BYPASS GRAFT     x2 vessel bypass -Dr. Burt Knack -[facility] 06-22-14  . EYE SURGERY  05/2011  . IR GENERIC HISTORICAL  08/29/2014   IR RADIOLOGIST EVAL & MGMT 08/29/2014 Aletta Edouard, MD [facility] INTERV RAD  . KYPHOSIS SURGERY    . obstructed ureter     operative repair  . RENAL CRYOABLATION Right 09/2014  . TONSILLECTOMY    . TOTAL ABDOMINAL HYSTERECTOMY W/ BILATERAL SALPINGOOPHORECTOMY     Family History  Problem Relation Age of Onset  . Parkinsonism Father   . Cancer Other     breast 1st degree relative  . Hypertension Other   . Heart disease Other     CAD.Marland Kitchen1ST DEGREE RELATIVE <60  . Hyperlipidemia Other   . Colon cancer Neg Hx   .  Esophageal cancer Neg Hx   . Rectal cancer Neg Hx   . Stomach cancer Neg Hx    History  Sexual Activity  . Sexual activity: Not Currently    Outpatient Encounter Prescriptions as of 10/23/2016  Medication Sig  . ALPRAZolam (XANAX) 0.5 MG tablet Take 0.5 mg by mouth 2 (two) times daily as needed for anxiety.  Marland Kitchen aspirin 81 MG EC tablet Take 81 mg by mouth daily.    . Cholecalciferol (VITAMIN D3) 1000 UNITS CAPS Take 1,000 Units by mouth daily.   . ferrous fumarate (FERRETTS) 325 (106 FE) MG TABS Take 1 tablet by mouth daily.   . fluconazole (DIFLUCAN) 100 MG tablet Take 100 mg by mouth daily as needed.   . fluticasone (FLONASE) 50 MCG/ACT nasal spray Place 2 sprays into both nostrils daily as needed for allergies or rhinitis.  . folic acid (FOLVITE) 1 MG tablet Take 1 mg by mouth daily.    . furosemide (LASIX) 40 MG tablet Take 40 mg by mouth daily as needed for fluid or edema.  Marland Kitchen levothyroxine (SYNTHROID, LEVOTHROID)  25 MCG tablet Take 1 tablet (25 mcg total) by mouth daily before breakfast. Office visit due now  . loratadine-pseudoephedrine (CLARITIN-D 24-HOUR) 10-240 MG per 24 hr tablet Take 1 tablet by mouth daily as needed for allergies.  Marland Kitchen metoCLOPramide (REGLAN) 10 MG tablet TAKE 1 TABLET BY MOUTH AT  BEDTIME  . Multiple Vitamin (MULTIVITAMIN WITH MINERALS) TABS tablet Take 1 tablet by mouth daily.  Marland Kitchen nystatin (MYCOSTATIN) 100000 UNIT/ML suspension Take 10 mLs by mouth as directed. Patient uses 10 ml once weekly to soak dentures  . potassium chloride SA (K-DUR,KLOR-CON) 20 MEQ tablet TAKE 1 TABLET BY MOUTH TWO  TIMES DAILY  . promethazine (PHENERGAN) 25 MG tablet Take 1 tablet by mouth two  times daily  . ranitidine (ZANTAC) 150 MG tablet Take 150 mg by mouth 2 (two) times daily.  Marland Kitchen rOPINIRole (REQUIP) 1 MG tablet TAKE 1 TABLET BY MOUTH AT  BEDTIME  . simvastatin (ZOCOR) 20 MG tablet TAKE 1 TABLET BY MOUTH  DAILY  . tiZANidine (ZANAFLEX) 2 MG tablet Take 2 mg by mouth every 3 (three) hours as needed for muscle spasms.   . traMADol (ULTRAM) 50 MG tablet Take 50 mg by mouth every 6 (six) hours as needed (pain).  . Zoster Vac Recomb Adjuvanted (SHINGRIX) 50 MCG SUSR Inject 1 mL into the muscle once.  . [DISCONTINUED] Zoster Vaccine Live, PF, (ZOSTAVAX) 13086 UNT/0.65ML injection Inject 19,400 Units into the skin once.   No facility-administered encounter medications on file as of 10/23/2016.     Activities of Daily Living In your present state of health, do you have any difficulty performing the following activities: 10/23/2016  Hearing? Y  Vision? N  Difficulty concentrating or making decisions? N  Walking or climbing stairs? N  Dressing or bathing? N  Doing errands, shopping? Y  Preparing Food and eating ? N  Using the Toilet? N  In the past six months, have you accidently leaked urine? N  Do you have problems with loss of bowel control? N  Managing your Medications? N  Managing your Finances? N    Housekeeping or managing your Housekeeping? N  Some recent data might be hidden    Patient Care Team: Ann Held, DO as PCP - General Milus Banister, MD as Consulting Physician (Gastroenterology) Aletta Edouard, MD as Consulting Physician (Interventional Radiology) Amy Martinique, MD as Consulting Physician (Dermatology) Sherren Mocha,  MD as Consulting Physician (Cardiology) Monna Fam, MD as Consulting Physician (Ophthalmology)    Assessment:    Physical assessment deferred to PCP.  Exercise Activities and Dietary recommendations Current Exercise Habits: Home exercise routine, Type of exercise: walking, Time (Minutes): 35, Frequency (Times/Week): 5, Weekly Exercise (Minutes/Week): 175  Diet (meal preparation, eat out, water intake, caffeinated beverages, dairy products, fruits and vegetables): in general, a "healthy" diet  , well balanced, on average, 3 meals per day. Meals vary, may eat eggs, tomatoes, soup. Eats very small meals. Does not use nutritional supplements d/t GI upset. She reports she has been small/underweight for quite some time and has had nutritional therapy/interventions in the past and she has not found this helpful.     Goals    . Healthy Lifestyle          Eat heart healthy diet (full of fruits, vegetables, whole grains, lean protein, water--limit salt, fat, and sugar intake) and increase physical activity as tolerated. Continue doing brain stimulating activities (puzzles, reading, adult coloring books, staying active) to keep memory sharp.       Fall Risk Fall Risk  10/23/2016 09/27/2015 09/27/2015 08/11/2014 06/06/2013  Falls in the past year? Yes Yes Yes No No  Number falls in past yr: 1 1 1  - -  Injury with Fall? No Yes Yes - -  Risk Factor Category  - High Fall Risk - - -  Risk for fall due to : - Impaired balance/gait;Impaired mobility - - -  Follow up - Education provided - - -   Depression Screen PHQ 2/9 Scores 10/23/2016 09/27/2015 09/27/2015  08/11/2014  PHQ - 2 Score 0 0 0 0     Cognitive Function MMSE - Mini Mental State Exam 10/23/2016  Orientation to time 5  Orientation to Place 5  Registration 3  Attention/ Calculation 5  Recall 3  Language- name 2 objects 2  Language- repeat 1  Language- follow 3 step command 3  Language- read & follow direction 1  Write a sentence 1  Copy design 1  Total score 30        Immunization History  Administered Date(s) Administered  . Influenza Split 05/26/2011, 05/31/2012  . Influenza Whole 05/31/2008, 05/17/2009, 05/22/2010  . Influenza, High Dose Seasonal PF 05/21/2014, 04/30/2015, 05/18/2016  . Influenza,inj,Quad PF,36+ Mos 06/06/2013  . Pneumococcal Conjugate-13 08/11/2014  . Pneumococcal Polysaccharide-23 06/11/2005  . Tdap 06/08/2016   Screening Tests Health Maintenance  Topic Date Due  . TETANUS/TDAP  06/08/2026  . INFLUENZA VACCINE  Completed  . DEXA SCAN  Completed  . PNA vac Low Risk Adult  Completed      Plan:    Follow-up w/ PCP as scheduled.   During the course of the visit the patient was educated and counseled about the following appropriate screening and preventive services:   Vaccines to include Pneumoccal, Influenza, Hepatitis B, Td, Zostavax, HCV  Cardiovascular Disease  Bone density screening  Diabetes screening  Glaucoma screening  Mammography/PAP  Nutrition counseling   Patient Instructions (the written plan) was given to the patient.   Dorrene German, RN  10/23/2016

## 2016-10-22 NOTE — Progress Notes (Signed)
Pre visit review using our clinic review tool, if applicable. No additional management support is needed unless otherwise documented below in the visit note. 

## 2016-10-22 NOTE — Telephone Encounter (Signed)
Called patient to attempt to schedule AWV either before or after PCP appt tomorrow.  Pt will call her daughter to see if daughter can take the extra time off work for AWV and call back to schedule.

## 2016-10-22 NOTE — Progress Notes (Signed)
Referring Physician(s): Dr Penny Pia  Chief Complaint: The patient is seen in follow up today s/p  Right renal mass cryoablation 10/06/2014  History of present illness:  Has no complaints Denies fever; pain Denies N/V Denies urinary symptoms Weight is stable  CT today: IMPRESSION: 1. Hyperdense cryoablation defect along the right lateral interpolar right kidney is stable in size. Favor mild pseudo enhancement related to small lesion size. 2. Aortic atherosclerosis (ICD10-170.0). Coronary artery calcification. 3. Cholelithiasis. 4. Left renal stones.   Past Medical History:  Diagnosis Date  . Allergy   . Anemia   . Anxiety   . Cataract 05/2011  . Chronic low blood pressure    due to dehydration  . Depression   . Esophageal disorder   . Esophageal yeast infection ([place])   . GERD (gastroesophageal reflux disease)   . Hearing loss    hearing aids  . Hyperlipidemia   . Hypothyroidism   . Myocardial infarction   . Osteoporosis   . Renal cell carcinoma ([place])    found on CT  . Thyroid disease    hypothyroidism  . Transfusion history    last 2004    Past Surgical History:  Procedure Laterality Date  . ABDOMINAL HYSTERECTOMY    . APPENDECTOMY    . BASAL CELL CARCINOMA EXCISION    . COLONOSCOPY    . CORONARY ARTERY BYPASS GRAFT     x2 vessel bypass -Dr. Burt Knack -[facility] 06-22-14  . EYE SURGERY  05/2011  . IR GENERIC HISTORICAL  08/29/2014   IR RADIOLOGIST EVAL & MGMT 08/29/2014 Aletta Edouard, MD [facility] INTERV RAD  . KYPHOSIS SURGERY    . obstructed ureter     operative repair  . RENAL CRYOABLATION Right 09/2014  . TONSILLECTOMY    . TOTAL ABDOMINAL HYSTERECTOMY W/ BILATERAL SALPINGOOPHORECTOMY      Allergies: Zofran [ondansetron hcl]; Clarithromycin; Codeine; Diazepam; Ondansetron; Sulfonamide derivatives; Prilosec [omeprazole magnesium]; and Trazodone  Medications: Prior to Admission medications   Medication Sig Start Date End Date Taking?  Authorizing Provider  ALPRAZolam Duanne Moron) 0.5 MG tablet Take 0.5 mg by mouth 2 (two) times daily as needed for anxiety.    Historical Provider, MD  aspirin 81 MG EC tablet Take 81 mg by mouth daily.      Historical Provider, MD  Cholecalciferol (VITAMIN D3) 1000 UNITS CAPS Take 1,000 Units by mouth daily.     Historical Provider, MD  ferrous fumarate (FERRETTS) 325 (106 FE) MG TABS Take 1 tablet by mouth daily.     Historical Provider, MD  fluconazole (DIFLUCAN) 100 MG tablet Take 100 mg by mouth daily. 07/28/16   Historical Provider, MD  fluticasone (FLONASE) 50 MCG/ACT nasal spray Place 2 sprays into both nostrils daily as needed for allergies or rhinitis.    Historical Provider, MD  folic acid (FOLVITE) 1 MG tablet Take 1 mg by mouth daily.      Historical Provider, MD  furosemide (LASIX) 40 MG tablet Take 40 mg by mouth daily as needed for fluid or edema.    Historical Provider, MD  levothyroxine (SYNTHROID, LEVOTHROID) 25 MCG tablet Take 1 tablet (25 mcg total) by mouth daily before breakfast. Office visit due now 04/29/16   Rosalita Chessman Chase, DO  loratadine-pseudoephedrine (CLARITIN-D 24-HOUR) 10-240 MG per 24 hr tablet Take 1 tablet by mouth daily as needed for allergies.    Historical Provider, MD  metoCLOPramide (REGLAN) 10 MG tablet TAKE 1 TABLET BY MOUTH AT  BEDTIME 07/28/16  Ann Held, DO  Multiple Vitamin (MULTIVITAMIN WITH MINERALS) TABS tablet Take 1 tablet by mouth daily.    Historical Provider, MD  nystatin (MYCOSTATIN) 100000 UNIT/ML suspension Take 10 mLs by mouth as directed. Patient uses 10 ml once weekly to soak dentures    Historical Provider, MD  potassium chloride SA (K-DUR,KLOR-CON) 20 MEQ tablet TAKE 1 TABLET BY MOUTH TWO  TIMES DAILY 07/28/16   Alferd Apa Lowne Chase, DO  promethazine (PHENERGAN) 25 MG tablet Take 1 tablet by mouth two  times daily 09/27/15   Rosalita Chessman Chase, DO  ranitidine (ZANTAC) 150 MG tablet Take 150 mg by mouth 2 (two) times daily.     Historical Provider, MD  rOPINIRole (REQUIP) 1 MG tablet TAKE 1 TABLET BY MOUTH AT  BEDTIME 07/28/16   Alferd Apa Lowne Chase, DO  simvastatin (ZOCOR) 20 MG tablet TAKE 1 TABLET BY MOUTH  DAILY 07/28/16   Alferd Apa Lowne Chase, DO  tiZANidine (ZANAFLEX) 2 MG tablet Take 2 mg by mouth every 3 (three) hours as needed for muscle spasms.     Historical Provider, MD  traMADol (ULTRAM) 50 MG tablet Take 50 mg by mouth every 6 (six) hours as needed (pain).    Historical Provider, MD     Family History  Problem Relation Age of Onset  . Parkinsonism Father   . Cancer Other     breast 1st degree relative  . Hypertension Other   . Heart disease Other     CAD.Marland Kitchen1ST DEGREE RELATIVE <60  . Hyperlipidemia Other   . Colon cancer Neg Hx   . Esophageal cancer Neg Hx   . Rectal cancer Neg Hx   . Stomach cancer Neg Hx     Social History   Social History  . Marital status: Single    Spouse name: N/A  . Number of children: 2  . Years of education: N/A   Occupational History  . Retired Retired   Social History Main Topics  . Smoking status: Former Research scientist (life sciences)  . Smokeless tobacco: Never Used  . Alcohol use No  . Drug use: No  . Sexual activity: Not Currently   Other Topics Concern  . Not on file   Social History Narrative   REG EXERCISE   [place]   LIVES ALONE   END OF LIFE:PATIENT HAS LIVING WILL AND CLEARLY STATES SHE DOES NOT WANT CARDIAC [place] VENTILATION OR OTHER HEROIC OR FUTILE MEASURES.     Vital Signs: BP (!) 93/48 (BP Location: Left Arm, Patient Position: Sitting, Cuff Size: Normal)   Pulse 80   Temp 98 F (36.7 C) (Oral)   Resp 15   Ht 5' 3.5" (1.613 m)   Wt 90 lb (40.8 kg)   SpO2 99%   BMI 15.69 kg/m   Physical Exam  Constitutional: She is oriented to person, place, and time.  Cardiovascular: Normal rate, regular rhythm and normal heart sounds.   Pulmonary/Chest: Effort normal and breath sounds normal.  Abdominal: Soft. Bowel sounds are normal.    Musculoskeletal: Normal range of motion.  Neurological: She is alert and oriented to person, place, and time.  Skin: Skin is warm.  Psychiatric: She has a normal mood and affect. Her behavior is normal.  Nursing note and vitals reviewed.   Imaging: Ct Abdomen W Wo Contrast  Result Date: 10/22/2016 CLINICAL DATA:  Right renal mass, status post cryoablation. EXAM: CT ABDOMEN WITHOUT AND WITH CONTRAST TECHNIQUE: Multidetector CT imaging of the abdomen was performed  following the standard protocol before and following the bolus administration of intravenous contrast. CONTRAST:  56mL ISOVUE-300 IOPAMIDOL (ISOVUE-300) INJECTION 61% COMPARISON:  10/23/2015. FINDINGS: Lower chest: Lung bases show no acute findings. Heart size normal. Coronary artery calcification. No pericardial or pleural effusion. Hepatobiliary: Liver is unremarkable. A stone is seen in the gallbladder. No biliary ductal dilatation. Pancreas: Negative. Spleen: Negative. Adrenals/Urinary Tract: Adrenal glands are unremarkable. A 1.2 cm lesion in the interpolar right kidney is seen with surrounding scarring, measuring 44 Hounsfield units on precontrast imaging, 60 Hounsfield units on arterial phase imaging and 62 Hounsfield units on nephrographic phase imaging. Size is stable from 10/23/2015. Scattered low attenuation lesions in the kidneys measure up to 1.2 cm and are likely cysts. Left renal stones. Stomach/Bowel: Stomach and visualized portions of the small bowel and colon are unremarkable. Vascular/Lymphatic: Atherosclerotic calcification of the arterial vasculature without abdominal aortic aneurysm. No pathologically enlarged lymph nodes. Other: No free fluid.  Mesenteries and peritoneum are unremarkable. Musculoskeletal: No worrisome lytic or sclerotic lesions. L1 vertebroplasty. Mild L5 compression deformity, likely stable. IMPRESSION: 1. Hyperdense cryoablation defect along the right lateral interpolar right kidney is stable in size.  Favor mild pseudo enhancement related to small lesion size. 2. Aortic atherosclerosis (ICD10-170.0). Coronary artery calcification. 3. Cholelithiasis. 4. Left renal stones. Electronically Signed   By: Lorin Picket M.D.   On: 10/22/2016 09:26    Labs:  CBC:  Recent Labs  03/19/16 1022  WBC 6.3  HGB 13.1  HCT 40.0  PLT 202    COAGS: No results for input(s): INR, APTT in the last 8760 hours.  BMP:  Recent Labs  01/15/16 0726 03/19/16 1022 05/19/16 0734 10/18/16 1147  NA 141 140 139  --   K 3.7 3.5 4.2  --   CL 110 107 108  --   CO2 25 22 25   --   GLUCOSE 84 92 82  --   BUN 14 17 17 10   CALCIUM 8.9 8.9 8.7  --   CREATININE 1.02 1.31* 1.10 1.03*  GFRNONAA  --  36*  --  49*  GFRAA  --  42*  --  57*    LIVER FUNCTION TESTS:  Recent Labs  01/15/16 0726 03/19/16 1022 05/19/16 0734  BILITOT 0.3 0.5 0.4  AST 28 57* 30  ALT 31 48 37*  ALKPHOS 55 65 71  PROT 5.7* 6.5 6.1  ALBUMIN 3.5 3.6 3.5    Assessment:  Right renal mass cryoablation 10/06/2014 Follows up yearly with CT Dr Kathlene Cote has reviewed imaging and discussed with pt findings Stable cryoablation zone. Plan for 1 more follow up CT Pt is aware and agreeable toplan  Signed: Juliet Vasbinder A 10/22/2016, 10:04 AM   Please refer to Dr. Kathlene Cote attestation of this note for management and plan.

## 2016-10-22 NOTE — Telephone Encounter (Signed)
AWV scheduled for 7:30 on 10/22/16.

## 2016-10-23 ENCOUNTER — Encounter: Payer: Self-pay | Admitting: Family Medicine

## 2016-10-23 ENCOUNTER — Ambulatory Visit (INDEPENDENT_AMBULATORY_CARE_PROVIDER_SITE_OTHER): Payer: MEDICARE | Admitting: Family Medicine

## 2016-10-23 VITALS — BP 102/52 | HR 84 | Temp 97.8°F | Resp 16 | Ht 63.0 in | Wt 82.6 lb

## 2016-10-23 DIAGNOSIS — E039 Hypothyroidism, unspecified: Secondary | ICD-10-CM

## 2016-10-23 DIAGNOSIS — Z0000 Encounter for general adult medical examination without abnormal findings: Secondary | ICD-10-CM | POA: Diagnosis not present

## 2016-10-23 DIAGNOSIS — E785 Hyperlipidemia, unspecified: Secondary | ICD-10-CM | POA: Diagnosis not present

## 2016-10-23 DIAGNOSIS — I2510 Atherosclerotic heart disease of native coronary artery without angina pectoris: Secondary | ICD-10-CM

## 2016-10-23 DIAGNOSIS — M810 Age-related osteoporosis without current pathological fracture: Secondary | ICD-10-CM

## 2016-10-23 LAB — CBC WITH DIFFERENTIAL/PLATELET
BASOS ABS: 0 10*3/uL (ref 0.0–0.1)
Basophils Relative: 0.5 % (ref 0.0–3.0)
EOS PCT: 0.1 % (ref 0.0–5.0)
Eosinophils Absolute: 0 10*3/uL (ref 0.0–0.7)
HEMATOCRIT: 40.2 % (ref 36.0–46.0)
Hemoglobin: 13.3 g/dL (ref 12.0–15.0)
LYMPHS PCT: 24.9 % (ref 12.0–46.0)
Lymphs Abs: 1.8 10*3/uL (ref 0.7–4.0)
MCHC: 33.1 g/dL (ref 30.0–36.0)
MCV: 96.6 fl (ref 78.0–100.0)
MONOS PCT: 8.7 % (ref 3.0–12.0)
Monocytes Absolute: 0.6 10*3/uL (ref 0.1–1.0)
NEUTROS ABS: 4.9 10*3/uL (ref 1.4–7.7)
Neutrophils Relative %: 65.8 % (ref 43.0–77.0)
PLATELETS: 307 10*3/uL (ref 150.0–400.0)
RBC: 4.16 Mil/uL (ref 3.87–5.11)
RDW: 13.3 % (ref 11.5–15.5)
WBC: 7.4 10*3/uL (ref 4.0–10.5)

## 2016-10-23 LAB — COMPREHENSIVE METABOLIC PANEL
ALK PHOS: 64 U/L (ref 39–117)
ALT: 39 U/L — AB (ref 0–35)
AST: 35 U/L (ref 0–37)
Albumin: 4.1 g/dL (ref 3.5–5.2)
BILIRUBIN TOTAL: 0.3 mg/dL (ref 0.2–1.2)
BUN: 16 mg/dL (ref 6–23)
CALCIUM: 9.5 mg/dL (ref 8.4–10.5)
CO2: 24 meq/L (ref 19–32)
Chloride: 104 mEq/L (ref 96–112)
Creatinine, Ser: 1.62 mg/dL — ABNORMAL HIGH (ref 0.40–1.20)
GFR: 31.98 mL/min — AB (ref >60.00)
Glucose, Bld: 95 mg/dL (ref 70–99)
POTASSIUM: 4 meq/L (ref 3.5–5.1)
Sodium: 140 mEq/L (ref 135–145)
TOTAL PROTEIN: 6.7 g/dL (ref 6.0–8.3)

## 2016-10-23 LAB — LIPID PANEL
Cholesterol: 182 mg/dL (ref 0–200)
HDL: 78.7 mg/dL (ref >39.00)
LDL Cholesterol: 92 mg/dL (ref 0–99)
NonHDL: 103.04
TRIGLYCERIDES: 55 mg/dL (ref 0.0–149.0)
Total CHOL/HDL Ratio: 2
VLDL: 11 mg/dL (ref 0.0–40.0)

## 2016-10-23 LAB — TSH: TSH: 6.58 u[IU]/mL — ABNORMAL HIGH (ref 0.35–4.50)

## 2016-10-23 MED ORDER — ZOSTER VACCINE LIVE 19400 UNT/0.65ML ~~LOC~~ SUSR: 0.6500 mL | Freq: Once | SUBCUTANEOUS | 0 refills | Status: DC

## 2016-10-23 MED ORDER — SIMVASTATIN 20 MG PO TABS: 20.0000 mg | ORAL_TABLET | Freq: Every day | ORAL | 0 refills | Status: DC

## 2016-10-23 MED ORDER — ZOSTER VAC RECOMB ADJUVANTED 50 MCG/0.5ML IM SUSR: 1.0000 mL | Freq: Once | INTRAMUSCULAR | 1 refills | Status: AC

## 2016-10-23 MED ORDER — LEVOTHYROXINE SODIUM 25 MCG PO TABS: 25.0000 ug | ORAL_TABLET | Freq: Every day | ORAL | 0 refills | Status: DC

## 2016-10-23 NOTE — Assessment & Plan Note (Signed)
Pt interested in prolia Will do PA and let pt know Recheck 1 year

## 2016-10-23 NOTE — Progress Notes (Signed)
Patient ID: [name], female    DOB: 1928/07/15  Age: 83 y.o. MRN: [id number]    Subjective:  Subjective  HPI CYNTORIA HETZEL presents for f/u thyroid and cholesterol.   No complaints.    Review of Systems  Constitutional: Negative for fever.  HENT: Negative for congestion.   Respiratory: Negative for shortness of breath.   Cardiovascular: Negative for chest pain, palpitations and leg swelling.  Gastrointestinal: Negative for abdominal pain, blood in stool and nausea.  Genitourinary: Negative for dysuria and frequency.  Skin: Negative for rash.  Allergic/Immunologic: Negative for environmental allergies.  Neurological: Negative for dizziness and headaches.  Psychiatric/Behavioral: The patient is not nervous/anxious.     History Past Medical History:  Diagnosis Date  . Allergy   . Anemia   . Anxiety   . Cataract 05/2011  . Chronic low blood pressure    due to dehydration  . Depression   . Esophageal disorder   . Esophageal yeast infection ([place])   . GERD (gastroesophageal reflux disease)   . Hearing loss    hearing aids  . Hyperlipidemia   . Hypothyroidism   . Myocardial infarction   . Osteoporosis   . Renal cell carcinoma ([place])    found on CT  . Thyroid disease    hypothyroidism  . Transfusion history    last 2004    She has a past surgical history that includes obstructed ureter; Abdominal hysterectomy; Total abdominal hysterectomy w/ bilateral salpingoophorectomy; Kyphosis surgery; Excision basal cell carcinoma; Eye surgery (05/2011); Tonsillectomy; Appendectomy; Colonoscopy; Coronary artery bypass graft; Renal cryoablation (Right, 09/2014); and ir generic historical (08/29/2014).   Her family history includes Cancer in her other; Heart disease in her other; Hyperlipidemia in her other; Hypertension in her other; Parkinsonism in her father.She reports that she has quit smoking. She has never used smokeless tobacco. She reports that she does not drink alcohol or use  drugs.  Current Outpatient Prescriptions on File Prior to Visit  Medication Sig Dispense Refill  . ALPRAZolam (XANAX) 0.5 MG tablet Take 0.5 mg by mouth 2 (two) times daily as needed for anxiety.    Marland Kitchen aspirin 81 MG EC tablet Take 81 mg by mouth daily.      . Cholecalciferol (VITAMIN D3) 1000 UNITS CAPS Take 1,000 Units by mouth daily.     . ferrous fumarate (FERRETTS) 325 (106 FE) MG TABS Take 1 tablet by mouth daily.     . fluconazole (DIFLUCAN) 100 MG tablet Take 100 mg by mouth daily as needed.     . fluticasone (FLONASE) 50 MCG/ACT nasal spray Place 2 sprays into both nostrils daily as needed for allergies or rhinitis.    . folic acid (FOLVITE) 1 MG tablet Take 1 mg by mouth daily.      . furosemide (LASIX) 40 MG tablet Take 40 mg by mouth daily as needed for fluid or edema.    Marland Kitchen loratadine-pseudoephedrine (CLARITIN-D 24-HOUR) 10-240 MG per 24 hr tablet Take 1 tablet by mouth daily as needed for allergies.    Marland Kitchen metoCLOPramide (REGLAN) 10 MG tablet TAKE 1 TABLET BY MOUTH AT  BEDTIME 90 tablet 0  . Multiple Vitamin (MULTIVITAMIN WITH MINERALS) TABS tablet Take 1 tablet by mouth daily.    Marland Kitchen nystatin (MYCOSTATIN) 100000 UNIT/ML suspension Take 10 mLs by mouth as directed. Patient uses 10 ml once weekly to soak dentures    . potassium chloride SA (K-DUR,KLOR-CON) 20 MEQ tablet TAKE 1 TABLET BY MOUTH TWO  TIMES DAILY  180 tablet 0  . promethazine (PHENERGAN) 25 MG tablet Take 1 tablet by mouth two  times daily 180 tablet 3  . ranitidine (ZANTAC) 150 MG tablet Take 150 mg by mouth 2 (two) times daily.    Marland Kitchen rOPINIRole (REQUIP) 1 MG tablet TAKE 1 TABLET BY MOUTH AT  BEDTIME 90 tablet 0  . tiZANidine (ZANAFLEX) 2 MG tablet Take 2 mg by mouth every 3 (three) hours as needed for muscle spasms.     . traMADol (ULTRAM) 50 MG tablet Take 50 mg by mouth every 6 (six) hours as needed (pain).     No current facility-administered medications on file prior to visit.      Objective:  Objective  Physical  Exam  Constitutional: She is oriented to person, place, and time. She appears well-developed and well-nourished.  HENT:  Head: Normocephalic and atraumatic.  Eyes: Conjunctivae and EOM are normal.  Neck: Normal range of motion. Neck supple. No JVD present. Carotid bruit is not present. No thyromegaly present.  Cardiovascular: Normal rate, regular rhythm and normal heart sounds.   No murmur heard. Pulmonary/Chest: Effort normal and breath sounds normal. No respiratory distress. She has no wheezes. She has no rales. She exhibits no tenderness.  Musculoskeletal: She exhibits no edema.  Neurological: She is alert and oriented to person, place, and time.  Psychiatric: She has a normal mood and affect. Her behavior is normal. Judgment and thought content normal.  Nursing note and vitals reviewed.  BP (!) 102/52 (BP Location: Left Arm, Patient Position: Sitting, Cuff Size: Normal)   Pulse 84   Temp 97.8 F (36.6 C) (Oral)   Resp 16   Ht 5\' 3"  (1.6 m)   Wt 82 lb 9.6 oz (37.5 kg)   SpO2 97%   BMI 14.63 kg/m  Wt Readings from Last 3 Encounters:  10/23/16 82 lb 9.6 oz (37.5 kg)  10/22/16 90 lb (40.8 kg)  09/03/16 84 lb (38.1 kg)     Lab Results  Component Value Date   WBC 7.4 10/23/2016   HGB 13.3 10/23/2016   HCT 40.2 10/23/2016   PLT 307.0 10/23/2016   GLUCOSE 95 10/23/2016   CHOL 182 10/23/2016   TRIG 55.0 10/23/2016   HDL 78.70 10/23/2016   LDLDIRECT 165.1 05/10/2008   LDLCALC 92 10/23/2016   ALT 39 (H) 10/23/2016   AST 35 10/23/2016   NA 140 10/23/2016   K 4.0 10/23/2016   CL 104 10/23/2016   CREATININE 1.62 (H) 10/23/2016   BUN 16 10/23/2016   CO2 24 10/23/2016   TSH 6.58 (H) 10/23/2016   INR 0.89 09/29/2014   HGBA1C 5.8 10/27/2013    Ct Abdomen W Wo Contrast  Result Date: 10/22/2016 CLINICAL DATA:  Right renal mass, status post cryoablation. EXAM: CT ABDOMEN WITHOUT AND WITH CONTRAST TECHNIQUE: Multidetector CT imaging of the abdomen was performed following the  standard protocol before and following the bolus administration of intravenous contrast. CONTRAST:  66mL ISOVUE-300 IOPAMIDOL (ISOVUE-300) INJECTION 61% COMPARISON:  10/23/2015. FINDINGS: Lower chest: Lung bases show no acute findings. Heart size normal. Coronary artery calcification. No pericardial or pleural effusion. Hepatobiliary: Liver is unremarkable. A stone is seen in the gallbladder. No biliary ductal dilatation. Pancreas: Negative. Spleen: Negative. Adrenals/Urinary Tract: Adrenal glands are unremarkable. A 1.2 cm lesion in the interpolar right kidney is seen with surrounding scarring, measuring 44 Hounsfield units on precontrast imaging, 60 Hounsfield units on arterial phase imaging and 62 Hounsfield units on nephrographic phase imaging. Size is stable from  10/23/2015. Scattered low attenuation lesions in the kidneys measure up to 1.2 cm and are likely cysts. Left renal stones. Stomach/Bowel: Stomach and visualized portions of the small bowel and colon are unremarkable. Vascular/Lymphatic: Atherosclerotic calcification of the arterial vasculature without abdominal aortic aneurysm. No pathologically enlarged lymph nodes. Other: No free fluid.  Mesenteries and peritoneum are unremarkable. Musculoskeletal: No worrisome lytic or sclerotic lesions. L1 vertebroplasty. Mild L5 compression deformity, likely stable. IMPRESSION: 1. Hyperdense cryoablation defect along the right lateral interpolar right kidney is stable in size. Favor mild pseudo enhancement related to small lesion size. 2. Aortic atherosclerosis (ICD10-170.0). Coronary artery calcification. 3. Cholelithiasis. 4. Left renal stones. Electronically Signed   By: Lorin Picket M.D.   On: 10/22/2016 09:26     Assessment & Plan:  Plan  I have discontinued Ms. Sinopoli's Zoster Vaccine Live (PF). I have also changed her simvastatin. Additionally, I am having her start on Zoster Vac Recomb Adjuvanted. Lastly, I am having her maintain her aspirin, folic  acid, Vitamin D3, ferrous fumarate, ranitidine, loratadine-pseudoephedrine, multivitamin with minerals, tiZANidine, promethazine, potassium chloride SA, metoCLOPramide, rOPINIRole, ALPRAZolam, fluticasone, nystatin, traMADol, furosemide, fluconazole, and levothyroxine.  Meds ordered this encounter  Medications  . DISCONTD: Zoster Vaccine Live, PF, (ZOSTAVAX) 29562 UNT/0.65ML injection    Sig: Inject 19,400 Units into the skin once.    Dispense:  1 each    Refill:  0  . Zoster Vac Recomb Adjuvanted (SHINGRIX) 50 MCG SUSR    Sig: Inject 1 mL into the muscle once.    Dispense:  1 each    Refill:  1  . levothyroxine (SYNTHROID, LEVOTHROID) 25 MCG tablet    Sig: Take 1 tablet (25 mcg total) by mouth daily before breakfast. Office visit due now    Dispense:  90 tablet    Refill:  0  . simvastatin (ZOCOR) 20 MG tablet    Sig: Take 1 tablet (20 mg total) by mouth daily.    Dispense:  90 tablet    Refill:  0    Problem List Items Addressed This Visit      Unprioritized   Hyperlipidemia - Primary    con't meds Check labs      Relevant Medications   simvastatin (ZOCOR) 20 MG tablet   Other Relevant Orders   Lipid panel (Completed)   Hypothyroidism    con't synthroid Check labs      Relevant Medications   levothyroxine (SYNTHROID, LEVOTHROID) 25 MCG tablet   Other Relevant Orders   TSH (Completed)   Osteoporosis    Pt interested in prolia Will do PA and let pt know Recheck 1 year       Other Visit Diagnoses    Encounter for Medicare annual wellness exam       Relevant Orders   CBC w/Diff (Completed)   Comprehensive metabolic panel (Completed)      Follow-up: Return in about 6 months (around 04/25/2017) for hypelipidemia, hyperthyroidism.  Ann Held, DO    0

## 2016-10-23 NOTE — Assessment & Plan Note (Signed)
con't synthroid Check labs  

## 2016-10-23 NOTE — Progress Notes (Signed)
reviewed

## 2016-10-23 NOTE — Assessment & Plan Note (Signed)
con't meds  Check labs 

## 2016-10-23 NOTE — Progress Notes (Deleted)
Subjective:    Patient ID: [name], female    DOB: 07-25-1928, 83 y.o.   MRN: [id number]   I acted as a Education administrator for Dr. Royden Purl, LPN   Chief Complaint  Patient presents with  . Medicare Wellness  . Osteoporosis    Last bone density in January. No treatment started. Pt's daughter states she is allergic to most treatments.  . Hypothyroidism    Pt here for follow up.  . Hyperlipidemia    follow up    Hyperlipidemia  This is a chronic problem. The current episode started more than 1 year ago. The problem is controlled. Pertinent negatives include no chest pain.    Patient is in today for follow up for hyperlipidemia, and hypothyroidism.  Past Medical History:  Diagnosis Date  . Allergy   . Anemia   . Anxiety   . Cataract 05/2011  . Chronic low blood pressure    due to dehydration  . Depression   . Esophageal disorder   . Esophageal yeast infection ([place])   . GERD (gastroesophageal reflux disease)   . Hearing loss    hearing aids  . Hyperlipidemia   . Hypothyroidism   . Myocardial infarction   . Osteoporosis   . Renal cell carcinoma ([place])    found on CT  . Thyroid disease    hypothyroidism  . Transfusion history    last 2004    Past Surgical History:  Procedure Laterality Date  . ABDOMINAL HYSTERECTOMY    . APPENDECTOMY    . BASAL CELL CARCINOMA EXCISION    . COLONOSCOPY    . CORONARY ARTERY BYPASS GRAFT     x2 vessel bypass -Dr. Burt Knack -[facility] 06-22-14  . EYE SURGERY  05/2011  . IR GENERIC HISTORICAL  08/29/2014   IR RADIOLOGIST EVAL & MGMT 08/29/2014 Aletta Edouard, MD [facility] INTERV RAD  . KYPHOSIS SURGERY    . obstructed ureter     operative repair  . RENAL CRYOABLATION Right 09/2014  . TONSILLECTOMY    . TOTAL ABDOMINAL HYSTERECTOMY W/ BILATERAL SALPINGOOPHORECTOMY      Family History  Problem Relation Age of Onset  . Parkinsonism Father   . Cancer Other     breast 1st degree relative  . Hypertension Other   . Heart disease Other      CAD.Marland Kitchen1ST DEGREE RELATIVE <60  . Hyperlipidemia Other   . Colon cancer Neg Hx   . Esophageal cancer Neg Hx   . Rectal cancer Neg Hx   . Stomach cancer Neg Hx     Social History   Social History  . Marital status: Single    Spouse name: N/A  . Number of children: 2  . Years of education: N/A   Occupational History  . Retired Retired   Social History Main Topics  . Smoking status: Former Research scientist (life sciences)  . Smokeless tobacco: Never Used  . Alcohol use No  . Drug use: No  . Sexual activity: Not Currently   Other Topics Concern  . Not on file   Social History Narrative   REG EXERCISE   [place]   LIVES ALONE   END OF LIFE:PATIENT HAS LIVING WILL AND CLEARLY STATES SHE DOES NOT WANT CARDIAC [place] VENTILATION OR OTHER HEROIC OR FUTILE MEASURES.    Outpatient Medications Prior to Visit  Medication Sig Dispense Refill  . ALPRAZolam (XANAX) 0.5 MG tablet Take 0.5 mg by mouth 2 (two) times daily as needed for anxiety.    Marland Kitchen  aspirin 81 MG EC tablet Take 81 mg by mouth daily.      . Cholecalciferol (VITAMIN D3) 1000 UNITS CAPS Take 1,000 Units by mouth daily.     . ferrous fumarate (FERRETTS) 325 (106 FE) MG TABS Take 1 tablet by mouth daily.     . fluconazole (DIFLUCAN) 100 MG tablet Take 100 mg by mouth daily as needed.     . fluticasone (FLONASE) 50 MCG/ACT nasal spray Place 2 sprays into both nostrils daily as needed for allergies or rhinitis.    . folic acid (FOLVITE) 1 MG tablet Take 1 mg by mouth daily.      . furosemide (LASIX) 40 MG tablet Take 40 mg by mouth daily as needed for fluid or edema.    Marland Kitchen loratadine-pseudoephedrine (CLARITIN-D 24-HOUR) 10-240 MG per 24 hr tablet Take 1 tablet by mouth daily as needed for allergies.    Marland Kitchen metoCLOPramide (REGLAN) 10 MG tablet TAKE 1 TABLET BY MOUTH AT  BEDTIME 90 tablet 0  . Multiple Vitamin (MULTIVITAMIN WITH MINERALS) TABS tablet Take 1 tablet by mouth daily.    Marland Kitchen nystatin (MYCOSTATIN) 100000 UNIT/ML suspension Take 10  mLs by mouth as directed. Patient uses 10 ml once weekly to soak dentures    . potassium chloride SA (K-DUR,KLOR-CON) 20 MEQ tablet TAKE 1 TABLET BY MOUTH TWO  TIMES DAILY 180 tablet 0  . promethazine (PHENERGAN) 25 MG tablet Take 1 tablet by mouth two  times daily 180 tablet 3  . ranitidine (ZANTAC) 150 MG tablet Take 150 mg by mouth 2 (two) times daily.    Marland Kitchen rOPINIRole (REQUIP) 1 MG tablet TAKE 1 TABLET BY MOUTH AT  BEDTIME 90 tablet 0  . tiZANidine (ZANAFLEX) 2 MG tablet Take 2 mg by mouth every 3 (three) hours as needed for muscle spasms.     . traMADol (ULTRAM) 50 MG tablet Take 50 mg by mouth every 6 (six) hours as needed (pain).    Marland Kitchen levothyroxine (SYNTHROID, LEVOTHROID) 25 MCG tablet Take 1 tablet (25 mcg total) by mouth daily before breakfast. Office visit due now 90 tablet 0  . simvastatin (ZOCOR) 20 MG tablet TAKE 1 TABLET BY MOUTH  DAILY 90 tablet 0   No facility-administered medications prior to visit.     Allergies  Allergen Reactions  . Zofran [Ondansetron Hcl] Anaphylaxis and Other (See Comments)    Tongue swelling, lost of voice  . Clarithromycin     Pt does not remember the reaction  . Codeine     Per the pt, "It made me disoriented"  . Diazepam Other (See Comments)    Per the pt, "I don't remember the reaction"  . Ondansetron Swelling  . Sulfonamide Derivatives     Per the pt, "It made me disoriented"  . Prilosec [Omeprazole Magnesium] Rash  . Trazodone Anxiety    Review of Systems  Constitutional: Negative for fever.  HENT: Negative for congestion.   Eyes: Negative for blurred vision.  Respiratory: Negative for cough.   Cardiovascular: Negative for chest pain and palpitations.  Gastrointestinal: Negative for vomiting.  Musculoskeletal: Negative for back pain.  Skin: Negative for rash.  Neurological: Negative for loss of consciousness and headaches.       Objective:    Physical Exam  Constitutional: She is oriented to person, place, and time. She  appears well-developed and well-nourished. No distress.  HENT:  Head: Normocephalic and atraumatic.  Eyes: Conjunctivae are normal. Pupils are equal, round, and reactive to light.  Neck:  Normal range of motion. No thyromegaly present.  Cardiovascular: Normal rate and regular rhythm.   Pulmonary/Chest: Effort normal and breath sounds normal. She has no wheezes.  Abdominal: Soft. Bowel sounds are normal. There is no tenderness.  Musculoskeletal: Normal range of motion. She exhibits no edema or deformity.  Neurological: She is alert and oriented to person, place, and time.  Skin: Skin is warm and dry. She is not diaphoretic.  Psychiatric: She has a normal mood and affect.    BP (!) 102/52 (BP Location: Left Arm, Patient Position: Sitting, Cuff Size: Normal)   Pulse 84   Temp 97.8 F (36.6 C) (Oral)   Resp 16   Ht 5\' 3"  (1.6 m)   Wt 82 lb 9.6 oz (37.5 kg)   SpO2 97%   BMI 14.63 kg/m  Wt Readings from Last 3 Encounters:  10/23/16 82 lb 9.6 oz (37.5 kg)  10/22/16 90 lb (40.8 kg)  09/03/16 84 lb (38.1 kg)     Lab Results  Component Value Date   WBC 7.4 10/23/2016   HGB 13.3 10/23/2016   HCT 40.2 10/23/2016   PLT 307.0 10/23/2016   GLUCOSE 95 10/23/2016   CHOL 182 10/23/2016   TRIG 55.0 10/23/2016   HDL 78.70 10/23/2016   LDLDIRECT 165.1 05/10/2008   LDLCALC 92 10/23/2016   ALT 39 (H) 10/23/2016   AST 35 10/23/2016   NA 140 10/23/2016   K 4.0 10/23/2016   CL 104 10/23/2016   CREATININE 1.62 (H) 10/23/2016   BUN 16 10/23/2016   CO2 24 10/23/2016   TSH 6.58 (H) 10/23/2016   INR 0.89 09/29/2014   HGBA1C 5.8 10/27/2013    Lab Results  Component Value Date   TSH 6.58 (H) 10/23/2016   Lab Results  Component Value Date   WBC 7.4 10/23/2016   HGB 13.3 10/23/2016   HCT 40.2 10/23/2016   MCV 96.6 10/23/2016   PLT 307.0 10/23/2016   Lab Results  Component Value Date   NA 140 10/23/2016   K 4.0 10/23/2016   CO2 24 10/23/2016   GLUCOSE 95 10/23/2016   BUN 16  10/23/2016   CREATININE 1.62 (H) 10/23/2016   BILITOT 0.3 10/23/2016   ALKPHOS 64 10/23/2016   AST 35 10/23/2016   ALT 39 (H) 10/23/2016   PROT 6.7 10/23/2016   ALBUMIN 4.1 10/23/2016   CALCIUM 9.5 10/23/2016   ANIONGAP 11 03/19/2016   GFR 31.98 (L) 10/23/2016   Lab Results  Component Value Date   CHOL 182 10/23/2016   Lab Results  Component Value Date   HDL 78.70 10/23/2016   Lab Results  Component Value Date   LDLCALC 92 10/23/2016   Lab Results  Component Value Date   TRIG 55.0 10/23/2016   Lab Results  Component Value Date   CHOLHDL 2 10/23/2016   Lab Results  Component Value Date   HGBA1C 5.8 10/27/2013       Assessment & Plan:   Problem List Items Addressed This Visit      Unprioritized   Hyperlipidemia - Primary    con't meds Check labs      Relevant Medications   simvastatin (ZOCOR) 20 MG tablet   Other Relevant Orders   Lipid panel (Completed)   Hypothyroidism    con't synthroid Check labs      Relevant Medications   levothyroxine (SYNTHROID, LEVOTHROID) 25 MCG tablet   Other Relevant Orders   TSH (Completed)   Osteoporosis    Pt interested in prolia  Will do PA and let pt know Recheck 1 year       Other Visit Diagnoses    Encounter for Medicare annual wellness exam       Relevant Orders   CBC w/Diff (Completed)   Comprehensive metabolic panel (Completed)      I have discontinued Ms. Croucher's Zoster Vaccine Live (PF). I have also changed her simvastatin. Additionally, I am having her start on Zoster Vac Recomb Adjuvanted. Lastly, I am having her maintain her aspirin, folic acid, Vitamin D3, ferrous fumarate, ranitidine, loratadine-pseudoephedrine, multivitamin with minerals, tiZANidine, promethazine, potassium chloride SA, metoCLOPramide, rOPINIRole, ALPRAZolam, fluticasone, nystatin, traMADol, furosemide, fluconazole, and levothyroxine.  Meds ordered this encounter  Medications  . DISCONTD: Zoster Vaccine Live, PF, (ZOSTAVAX)  19400 UNT/0.65ML injection    Sig: Inject 19,400 Units into the skin once.    Dispense:  1 each    Refill:  0  . Zoster Vac Recomb Adjuvanted (SHINGRIX) 50 MCG SUSR    Sig: Inject 1 mL into the muscle once.    Dispense:  1 each    Refill:  1  . levothyroxine (SYNTHROID, LEVOTHROID) 25 MCG tablet    Sig: Take 1 tablet (25 mcg total) by mouth daily before breakfast. Office visit due now    Dispense:  90 tablet    Refill:  0  . simvastatin (ZOCOR) 20 MG tablet    Sig: Take 1 tablet (20 mg total) by mouth daily.    Dispense:  90 tablet    Refill:  0    {PROVIDER TO DELETE} Ann Held, DOPatient ID: [name], female   DOB: 12/12/1927, 83 y.o.   MRN: [id number]

## 2016-10-23 NOTE — Patient Instructions (Signed)
Preventive Care 65 Years and Older, Female Preventive care refers to lifestyle choices and visits with your health care provider that can promote health and wellness. What does preventive care include?  A yearly physical exam. This is also called an annual well check.  Dental exams once or twice a year.  Routine eye exams. Ask your health care provider how often you should have your eyes checked.  Personal lifestyle choices, including:  Daily care of your teeth and gums.  Regular physical activity.  Eating a healthy diet.  Avoiding tobacco and drug use.  Limiting alcohol use.  Practicing safe sex.  Taking low-dose aspirin every day.  Taking vitamin and mineral supplements as recommended by your health care provider. What happens during an annual well check? The services and screenings done by your health care provider during your annual well check will depend on your age, overall health, lifestyle risk factors, and family history of disease. Counseling  Your health care provider may ask you questions about your:  Alcohol use.  Tobacco use.  Drug use.  Emotional well-being.  Home and relationship well-being.  Sexual activity.  Eating habits.  History of falls.  Memory and ability to understand (cognition).  Work and work environment.  Reproductive health. Screening  You may have the following tests or measurements:  Height, weight, and BMI.  Blood pressure.  Lipid and cholesterol levels. These may be checked every 5 years, or more frequently if you are over 50 years old.  Skin check.  Lung cancer screening. You may have this screening every year starting at age 55 if you have a 30-pack-year history of smoking and currently smoke or have quit within the past 15 years.  Fecal occult blood test (FOBT) of the stool. You may have this test every year starting at age 50.  Flexible sigmoidoscopy or colonoscopy. You may have a sigmoidoscopy every 5 years or  a colonoscopy every 10 years starting at age 50.  Hepatitis C blood test.  Hepatitis B blood test.  Sexually transmitted disease (STD) testing.  Diabetes screening. This is done by checking your blood sugar (glucose) after you have not eaten for a while (fasting). You may have this done every 1-3 years.  Bone density scan. This is done to screen for osteoporosis. You may have this done starting at age 81.  Mammogram. This may be done every 1-2 years. Talk to your health care provider about how often you should have regular mammograms. Talk with your health care provider about your test results, treatment options, and if necessary, the need for more tests. Vaccines  Your health care provider may recommend certain vaccines, such as:  Influenza vaccine. This is recommended every year.  Tetanus, diphtheria, and acellular pertussis (Tdap, Td) vaccine. You may need a Td booster every 10 years.  Varicella vaccine. You may need this if you have not been vaccinated.  Zoster vaccine. You may need this after age 60.  Measles, mumps, and rubella (MMR) vaccine. You may need at least one dose of MMR if you were born in 1957 or later. You may also need a second dose.  Pneumococcal 13-valent conjugate (PCV13) vaccine. One dose is recommended after age 81.  Pneumococcal polysaccharide (PPSV23) vaccine. One dose is recommended after age 81.  Meningococcal vaccine. You may need this if you have certain conditions.  Hepatitis A vaccine. You may need this if you have certain conditions or if you travel or work in places where you may be exposed to   hepatitis A.  Hepatitis B vaccine. You may need this if you have certain conditions or if you travel or work in places where you may be exposed to hepatitis B.  Haemophilus influenzae type b (Hib) vaccine. You may need this if you have certain conditions. Talk to your health care provider about which screenings and vaccines you need and how often you need  them. This information is not intended to replace advice given to you by your health care provider. Make sure you discuss any questions you have with your health care provider. Document Released: 09/07/2015 Document Revised: 04/30/2016 Document Reviewed: 06/12/2015 Elsevier Interactive Patient Education  2017 Elsevier Inc.  

## 2016-10-24 ENCOUNTER — Other Ambulatory Visit: Payer: Self-pay | Admitting: Family Medicine

## 2016-10-24 ENCOUNTER — Encounter: Payer: Self-pay | Admitting: Family Medicine

## 2016-10-24 DIAGNOSIS — N289 Disorder of kidney and ureter, unspecified: Secondary | ICD-10-CM

## 2016-10-24 DIAGNOSIS — R7989 Other specified abnormal findings of blood chemistry: Secondary | ICD-10-CM

## 2016-10-24 MED ORDER — LEVOTHYROXINE SODIUM 50 MCG PO TABS: 50.0000 ug | ORAL_TABLET | Freq: Every day | ORAL | 0 refills | Status: DC

## 2016-10-31 ENCOUNTER — Encounter: Payer: Self-pay | Admitting: Interventional Radiology

## 2016-11-06 ENCOUNTER — Telehealth: Payer: Self-pay | Admitting: Family Medicine

## 2016-11-06 ENCOUNTER — Other Ambulatory Visit: Payer: Self-pay | Admitting: Family Medicine

## 2016-11-06 DIAGNOSIS — I10 Essential (primary) hypertension: Secondary | ICD-10-CM

## 2016-11-06 DIAGNOSIS — G2581 Restless legs syndrome: Secondary | ICD-10-CM

## 2016-11-06 DIAGNOSIS — R110 Nausea: Secondary | ICD-10-CM

## 2016-11-06 DIAGNOSIS — E785 Hyperlipidemia, unspecified: Secondary | ICD-10-CM

## 2016-11-06 MED ORDER — ALPRAZOLAM 0.5 MG PO TABS: 0.5000 mg | ORAL_TABLET | Freq: Two times a day (BID) | ORAL | 0 refills | Status: DC | PRN

## 2016-11-06 MED ORDER — TRAMADOL HCL 50 MG PO TABS: 50.0000 mg | ORAL_TABLET | Freq: Four times a day (QID) | ORAL | 0 refills | Status: DC | PRN

## 2016-11-06 NOTE — Telephone Encounter (Signed)
Refill x1 each 

## 2016-11-06 NOTE — Telephone Encounter (Signed)
Requesting:   Alprazolam and Tramadol Contract   None UDS  None Last OV   10/23/2016 Last Refill  alprazolam on 04/30/2016  #180                   Tramadol  #270 on 9//01/2016  Please Advise---[facility] fax number is [phone]

## 2016-11-06 NOTE — Telephone Encounter (Signed)
Faxed hardcopy of Both Alprazolam and Tramadol to [facility]

## 2016-12-23 ENCOUNTER — Encounter: Payer: Self-pay | Admitting: Family Medicine

## 2016-12-23 DIAGNOSIS — R7989 Other specified abnormal findings of blood chemistry: Secondary | ICD-10-CM

## 2016-12-23 DIAGNOSIS — N289 Disorder of kidney and ureter, unspecified: Secondary | ICD-10-CM

## 2017-01-05 ENCOUNTER — Other Ambulatory Visit (INDEPENDENT_AMBULATORY_CARE_PROVIDER_SITE_OTHER): Payer: MEDICARE

## 2017-01-05 DIAGNOSIS — R946 Abnormal results of thyroid function studies: Secondary | ICD-10-CM

## 2017-01-05 DIAGNOSIS — N289 Disorder of kidney and ureter, unspecified: Secondary | ICD-10-CM

## 2017-01-05 DIAGNOSIS — R7989 Other specified abnormal findings of blood chemistry: Secondary | ICD-10-CM

## 2017-01-05 LAB — BASIC METABOLIC PANEL
BUN: 18 mg/dL (ref 6–23)
CO2: 21 mEq/L (ref 19–32)
Calcium: 9.2 mg/dL (ref 8.4–10.5)
Chloride: 106 mEq/L (ref 96–112)
Creatinine, Ser: 1.28 mg/dL — ABNORMAL HIGH (ref 0.40–1.20)
GFR: 41.95 mL/min — ABNORMAL LOW (ref >60.00)
GLUCOSE: 98 mg/dL (ref 70–99)
Potassium: 3.8 mEq/L (ref 3.5–5.1)
Sodium: 137 mEq/L (ref 135–145)

## 2017-01-05 LAB — TSH: TSH: 0.55 u[IU]/mL (ref 0.35–4.50)

## 2017-01-08 ENCOUNTER — Encounter: Payer: Self-pay | Admitting: Family Medicine

## 2017-01-09 ENCOUNTER — Other Ambulatory Visit: Payer: Self-pay | Admitting: Family Medicine

## 2017-01-09 ENCOUNTER — Encounter: Payer: Self-pay | Admitting: Family Medicine

## 2017-01-09 NOTE — Telephone Encounter (Signed)
According to our records she is on 50 mcg --- that is what she should con't

## 2017-01-12 MED ORDER — LEVOTHYROXINE SODIUM 50 MCG PO TABS: 50.0000 ug | ORAL_TABLET | Freq: Every day | ORAL | 2 refills | Status: DC

## 2017-03-30 ENCOUNTER — Telehealth: Payer: Self-pay | Admitting: Family Medicine

## 2017-03-30 ENCOUNTER — Other Ambulatory Visit: Payer: Self-pay | Admitting: Family Medicine

## 2017-03-30 DIAGNOSIS — R110 Nausea: Secondary | ICD-10-CM

## 2017-03-30 NOTE — Telephone Encounter (Signed)
Requesting:   Alprazolam and Tramadol Contract   none UDS   none Last OV   10/23/2016----future appt is on 04/28/2017 Last Refill   Alprazolam  #180 no refills on 11/06/2016                    Tramadol   #270  No refills on 11/06/2016  Please Advise-----[facility]

## 2017-03-30 NOTE — Telephone Encounter (Signed)
She can have Alprazolam same strength, same sig but only 40 and Tramadol, same strength, same sig #60 til seen so she can discuss with PMD, does need uds and contract

## 2017-03-31 MED ORDER — TRAMADOL HCL 50 MG PO TABS
50.0000 mg | ORAL_TABLET | Freq: Four times a day (QID) | ORAL | 0 refills | Status: DC | PRN
Start: 2017-03-31 — End: 2017-06-26

## 2017-03-31 MED ORDER — ALPRAZOLAM 0.5 MG PO TABS: 0.5000 mg | ORAL_TABLET | Freq: Two times a day (BID) | ORAL | 0 refills | Status: DC | PRN

## 2017-03-31 NOTE — Telephone Encounter (Signed)
Printed/PCP covering signed and faxed to [facility]

## 2017-04-04 ENCOUNTER — Other Ambulatory Visit: Payer: Self-pay | Admitting: Family Medicine

## 2017-04-06 NOTE — Telephone Encounter (Signed)
Please talk to patient and make sure she requested it

## 2017-04-06 NOTE — Telephone Encounter (Signed)
We have not written for this since 2016.  Would you like for [place] to send in refill or do you want her to have an ov?

## 2017-04-06 NOTE — Telephone Encounter (Signed)
Patient stated that she did not request it yet because she still has some left.  She will call the mail order when she needs it.

## 2017-04-09 ENCOUNTER — Encounter: Payer: Self-pay | Admitting: Family Medicine

## 2017-04-10 ENCOUNTER — Other Ambulatory Visit: Payer: Self-pay | Admitting: Family Medicine

## 2017-04-10 DIAGNOSIS — E785 Hyperlipidemia, unspecified: Secondary | ICD-10-CM

## 2017-04-10 DIAGNOSIS — E039 Hypothyroidism, unspecified: Secondary | ICD-10-CM

## 2017-04-10 NOTE — Telephone Encounter (Signed)
She actually does have medical problems--- she is on thyroid medication, and cholesterol meds Last thyroid check was normal We are also watching kidney function  Ok to get labs at [facility] Lipid, cmp, tsh  They will need to check with pharmacy to see if her dose of levothyroxine is one of the doses affected--- it was only certain doses

## 2017-04-13 DIAGNOSIS — H40013 Open angle with borderline findings, low risk, bilateral: Secondary | ICD-10-CM | POA: Diagnosis not present

## 2017-04-13 DIAGNOSIS — Z961 Presence of intraocular lens: Secondary | ICD-10-CM | POA: Diagnosis not present

## 2017-04-13 DIAGNOSIS — H353132 Nonexudative age-related macular degeneration, bilateral, intermediate dry stage: Secondary | ICD-10-CM | POA: Diagnosis not present

## 2017-04-13 DIAGNOSIS — H35363 Drusen (degenerative) of macula, bilateral: Secondary | ICD-10-CM | POA: Diagnosis not present

## 2017-04-21 ENCOUNTER — Telehealth: Payer: Self-pay | Admitting: Family Medicine

## 2017-04-21 NOTE — Telephone Encounter (Signed)
Called pt to r/s AWV appt scheduled for 10/26/2017 with Hoyle Sauer. Pt did not answer. Appt needs to be rescheduled with [facility] on 10/26/2017 for 9am. SF

## 2017-04-28 ENCOUNTER — Ambulatory Visit: Payer: Self-pay | Admitting: Family Medicine

## 2017-04-30 DIAGNOSIS — H26491 Other secondary cataract, right eye: Secondary | ICD-10-CM | POA: Diagnosis not present

## 2017-05-04 ENCOUNTER — Other Ambulatory Visit: Payer: Self-pay | Admitting: Family Medicine

## 2017-05-04 DIAGNOSIS — I10 Essential (primary) hypertension: Secondary | ICD-10-CM

## 2017-05-04 DIAGNOSIS — G2581 Restless legs syndrome: Secondary | ICD-10-CM

## 2017-05-04 DIAGNOSIS — E785 Hyperlipidemia, unspecified: Secondary | ICD-10-CM

## 2017-05-14 DIAGNOSIS — H26492 Other secondary cataract, left eye: Secondary | ICD-10-CM | POA: Diagnosis not present

## 2017-05-15 ENCOUNTER — Other Ambulatory Visit (INDEPENDENT_AMBULATORY_CARE_PROVIDER_SITE_OTHER): Payer: MEDICARE

## 2017-05-15 DIAGNOSIS — E785 Hyperlipidemia, unspecified: Secondary | ICD-10-CM | POA: Diagnosis not present

## 2017-05-15 DIAGNOSIS — E039 Hypothyroidism, unspecified: Secondary | ICD-10-CM

## 2017-05-15 LAB — COMPREHENSIVE METABOLIC PANEL
ALK PHOS: 49 U/L (ref 39–117)
ALT: 18 U/L (ref 0–35)
AST: 23 U/L (ref 0–37)
Albumin: 3.6 g/dL (ref 3.5–5.2)
BILIRUBIN TOTAL: 0.5 mg/dL (ref 0.2–1.2)
BUN: 10 mg/dL (ref 6–23)
CALCIUM: 9.3 mg/dL (ref 8.4–10.5)
CO2: 26 meq/L (ref 19–32)
CREATININE: 0.96 mg/dL (ref 0.40–1.20)
Chloride: 105 mEq/L (ref 96–112)
GFR: 58.42 mL/min — ABNORMAL LOW (ref >60.00)
GLUCOSE: 104 mg/dL — AB (ref 70–99)
Potassium: 4.4 mEq/L (ref 3.5–5.1)
Sodium: 139 mEq/L (ref 135–145)
TOTAL PROTEIN: 6 g/dL (ref 6.0–8.3)

## 2017-05-15 LAB — LIPID PANEL
CHOL/HDL RATIO: 3
Cholesterol: 182 mg/dL (ref 0–200)
HDL: 68.9 mg/dL (ref >39.00)
LDL Cholesterol: 100 mg/dL — ABNORMAL HIGH (ref 0–99)
NonHDL: 112.96
TRIGLYCERIDES: 66 mg/dL (ref 0.0–149.0)
VLDL: 13.2 mg/dL (ref 0.0–40.0)

## 2017-05-15 LAB — TSH: TSH: 0.17 u[IU]/mL — ABNORMAL LOW (ref 0.35–4.50)

## 2017-05-17 ENCOUNTER — Encounter: Payer: Self-pay | Admitting: Family Medicine

## 2017-05-18 ENCOUNTER — Other Ambulatory Visit: Payer: Self-pay | Admitting: Family Medicine

## 2017-05-18 DIAGNOSIS — I10 Essential (primary) hypertension: Secondary | ICD-10-CM

## 2017-05-18 DIAGNOSIS — G2581 Restless legs syndrome: Secondary | ICD-10-CM

## 2017-05-18 DIAGNOSIS — R7989 Other specified abnormal findings of blood chemistry: Secondary | ICD-10-CM

## 2017-05-18 DIAGNOSIS — R110 Nausea: Secondary | ICD-10-CM

## 2017-05-18 DIAGNOSIS — E785 Hyperlipidemia, unspecified: Secondary | ICD-10-CM

## 2017-05-18 MED ORDER — LEVOTHYROXINE SODIUM 25 MCG PO TABS: 25.0000 ug | ORAL_TABLET | Freq: Every day | ORAL | 1 refills | Status: DC

## 2017-05-18 MED ORDER — FUROSEMIDE 40 MG PO TABS: 40.0000 mg | ORAL_TABLET | Freq: Every day | ORAL | 1 refills | Status: DC | PRN

## 2017-05-18 MED ORDER — SIMVASTATIN 20 MG PO TABS: 20.0000 mg | ORAL_TABLET | Freq: Every day | ORAL | 1 refills | Status: DC

## 2017-05-18 MED ORDER — ROPINIROLE HCL 1 MG PO TABS: 1.0000 mg | ORAL_TABLET | Freq: Every day | ORAL | 1 refills | Status: DC

## 2017-05-18 MED ORDER — METOCLOPRAMIDE HCL 10 MG PO TABS: 10.0000 mg | ORAL_TABLET | Freq: Every day | ORAL | 2 refills | Status: DC

## 2017-05-18 MED ORDER — POTASSIUM CHLORIDE CRYS ER 20 MEQ PO TBCR: 20.0000 meq | EXTENDED_RELEASE_TABLET | Freq: Two times a day (BID) | ORAL | 1 refills | Status: DC

## 2017-05-18 NOTE — Telephone Encounter (Signed)
Notes recorded by Ewing, Robin B, CMA on 05/18/2017 at 4:32 PM EDT Patient notified of results/instructions. Updated medications and sent in thyroid medication. Put in order for labs at the [place] office (her choice). She did verbalize understanding. ------  Notes recorded by Ann Held, DO on 05/17/2017 at 10:29 AM EDT Hyperthyroid-- dec synthroid to 25 mcg 1 po qd #30 2 refillls Recheck tsh in 2 months-- lab  Cholesterol normal Recheck 6 months--- lipid, cmp

## 2017-05-22 ENCOUNTER — Telehealth: Payer: Self-pay | Admitting: Family Medicine

## 2017-05-22 NOTE — Telephone Encounter (Signed)
Pharmacy called in to make provider aware that medication is not longer available through mail order and would like to be advised further.    Ref# [id number]  Phone: [phone]

## 2017-05-24 DIAGNOSIS — Z23 Encounter for immunization: Secondary | ICD-10-CM | POA: Diagnosis not present

## 2017-05-25 ENCOUNTER — Telehealth: Payer: Self-pay | Admitting: Family Medicine

## 2017-05-25 MED ORDER — POTASSIUM CHLORIDE CRYS ER 20 MEQ PO TBCR: 20.0000 meq | EXTENDED_RELEASE_TABLET | Freq: Two times a day (BID) | ORAL | 1 refills | Status: DC

## 2017-05-25 NOTE — Telephone Encounter (Signed)
Spoke with Trixy at [employer] she voiced that they no longer cover Kdur (Potassium). The medication has been changed to Klor-Kon 20 mEq ER; representative wanted to know if it's okay to make the switch.  Please advise. Thanks.  RN informed patient of this information as well. In the meantime, the patient is going to check with her insurance company to see if they will cover the new medication before sending the current Rx to her local pharmacy. She will call the office later with an update.

## 2017-05-25 NOTE — Telephone Encounter (Signed)
Made change to klor-con  Meds ordered this encounter  Medications  . potassium chloride SA (KLOR-CON M20) 20 MEQ tablet    Sig: Take 1 tablet (20 mEq total) by mouth 2 (two) times daily.    Dispense:  180 tablet    Refill:  1

## 2017-05-25 NOTE — Telephone Encounter (Signed)
Per the patient, her insurance will cover generic potassium.

## 2017-05-25 NOTE — Telephone Encounter (Signed)
Pt states that [facility] is trying to get in touch with provider about the below information mentioned to please call ASAP since pt is needing her prescription. Please advise.

## 2017-05-25 NOTE — Telephone Encounter (Signed)
Caller name: Relation to IT:JLLV Call back number:[phone] Pharmacy:  Reason for call: pt would like for you to call her back, states she has been speaking with you back and forth.

## 2017-05-26 NOTE — Telephone Encounter (Signed)
Please see telephone note 05/22/17. Thanks.

## 2017-05-27 ENCOUNTER — Other Ambulatory Visit: Payer: Self-pay

## 2017-06-25 ENCOUNTER — Telehealth: Payer: Self-pay | Admitting: Family Medicine

## 2017-06-25 NOTE — Telephone Encounter (Signed)
Pt called for refills TRAMADOL & ALPRAZOLAM sent to [place].  Pt says pharmacy told her they already sent refill request. Advised pt we have no record of it being sent.

## 2017-06-26 MED ORDER — ALPRAZOLAM 0.5 MG PO TABS: 0.5000 mg | ORAL_TABLET | Freq: Two times a day (BID) | ORAL | 1 refills | Status: DC | PRN

## 2017-06-26 MED ORDER — TRAMADOL HCL 50 MG PO TABS: 50.0000 mg | ORAL_TABLET | Freq: Four times a day (QID) | ORAL | 1 refills | Status: DC | PRN

## 2017-06-26 NOTE — Telephone Encounter (Signed)
Patient notified that rx have been [facility].

## 2017-06-26 NOTE — Telephone Encounter (Signed)
Refill request tramadol and alprazolam to be sent to [facility]  Database ran and is on your desk  Last filled per database: 03/31/17 Last written: 04/10/17 for 3 mo supply Last ov: 10/23/16 Next ov: 10/26/17 Contract: not on file UDS: not on file  For the Contract and UDS would you like for [place] to collect?

## 2017-06-26 NOTE — Telephone Encounter (Signed)
Refill 3 m with 1 refill each

## 2017-07-13 ENCOUNTER — Other Ambulatory Visit: Payer: MEDICARE

## 2017-07-22 ENCOUNTER — Other Ambulatory Visit (INDEPENDENT_AMBULATORY_CARE_PROVIDER_SITE_OTHER): Payer: MEDICARE

## 2017-07-22 DIAGNOSIS — R7989 Other specified abnormal findings of blood chemistry: Secondary | ICD-10-CM

## 2017-07-22 LAB — TSH: TSH: 1.81 u[IU]/mL (ref 0.35–4.50)

## 2017-07-27 ENCOUNTER — Other Ambulatory Visit: Payer: Self-pay | Admitting: Family Medicine

## 2017-09-14 ENCOUNTER — Other Ambulatory Visit: Payer: Self-pay | Admitting: Family Medicine

## 2017-09-14 DIAGNOSIS — G2581 Restless legs syndrome: Secondary | ICD-10-CM

## 2017-09-14 DIAGNOSIS — E785 Hyperlipidemia, unspecified: Secondary | ICD-10-CM

## 2017-09-24 ENCOUNTER — Telehealth: Payer: Self-pay | Admitting: *Deleted

## 2017-09-24 ENCOUNTER — Other Ambulatory Visit (HOSPITAL_COMMUNITY): Payer: Self-pay | Admitting: Interventional Radiology

## 2017-09-24 ENCOUNTER — Other Ambulatory Visit: Payer: Self-pay | Admitting: *Deleted

## 2017-09-24 DIAGNOSIS — N2889 Other specified disorders of kidney and ureter: Secondary | ICD-10-CM

## 2017-09-24 NOTE — Telephone Encounter (Signed)
[facility] sent fax stating that they no longer dispense more than a 30 day supply one at a time.  To align with this effort, the dispensed quantity for tramadol dated 06/26/17 has been reduced fron 270 to 120.  The partial quantity remaining is processed as 30 day supply refills for the limit available on the prescription or as restricted by state law.

## 2017-10-05 ENCOUNTER — Other Ambulatory Visit: Payer: Self-pay | Admitting: Family Medicine

## 2017-10-05 DIAGNOSIS — R110 Nausea: Secondary | ICD-10-CM

## 2017-10-06 NOTE — Telephone Encounter (Signed)
Will get at appt on 10/26/17

## 2017-10-06 NOTE — Telephone Encounter (Signed)
Need uds and contract

## 2017-10-06 NOTE — Telephone Encounter (Signed)
[facility] requesting refill on alprazolam  Database ran and is on your desk for review.   Last filled per database: 06/26/17 Last written: 06/26/17 Last ov: 10/23/16 Next ov: none Contract: none UDS: none  Patient is also getting tramadol from you as well

## 2017-10-07 ENCOUNTER — Telehealth: Payer: Self-pay | Admitting: Family Medicine

## 2017-10-07 NOTE — Telephone Encounter (Signed)
Called pt and she said her daughter will have to call back and give [place] the best time to reschedule her appt on 10/26/17 per pcp.

## 2017-10-08 ENCOUNTER — Telehealth: Payer: Self-pay | Admitting: *Deleted

## 2017-10-08 NOTE — Telephone Encounter (Signed)
Copied from [place]. Topic: Appointment Scheduling - Scheduling Inquiry for Clinic >> Oct 07, 2017  3:14 PM Margot Ables wrote: Reason for CRM: pt was scheduled for 10/26/17 for cpe Dr. Carollee Herter & AWV with [facility] and appt has to be moved as provider is out of office - pt daughter states she cannot wait til May/June for morning cpe to be done. Please advise.

## 2017-10-09 NOTE — Telephone Encounter (Signed)
Can we make an exception for her?

## 2017-10-09 NOTE — Telephone Encounter (Signed)
YES

## 2017-10-11 ENCOUNTER — Encounter: Payer: Self-pay | Admitting: Family Medicine

## 2017-10-12 NOTE — Telephone Encounter (Signed)
Please schedule

## 2017-10-12 NOTE — Telephone Encounter (Signed)
Kim-- please see 10/08/16 phone note. Can you contact pt's daughter about this. Dr Etter Sjogren chase gave approval to make an exception for this pt in previous phone note.  Thanks!

## 2017-10-12 NOTE — Telephone Encounter (Signed)
This was handled by Vergia Alcon Earlier today and I was told that pt's daughter had been notified.

## 2017-10-14 NOTE — Telephone Encounter (Signed)
11/03/2017- Patient appointment

## 2017-10-23 DIAGNOSIS — H40013 Open angle with borderline findings, low risk, bilateral: Secondary | ICD-10-CM | POA: Diagnosis not present

## 2017-10-23 DIAGNOSIS — H353132 Nonexudative age-related macular degeneration, bilateral, intermediate dry stage: Secondary | ICD-10-CM | POA: Diagnosis not present

## 2017-10-23 DIAGNOSIS — Z961 Presence of intraocular lens: Secondary | ICD-10-CM | POA: Diagnosis not present

## 2017-10-23 DIAGNOSIS — H04123 Dry eye syndrome of bilateral lacrimal glands: Secondary | ICD-10-CM | POA: Diagnosis not present

## 2017-10-26 ENCOUNTER — Encounter: Payer: Self-pay | Admitting: Family Medicine

## 2017-11-02 NOTE — Progress Notes (Signed)
Subjective:   [name] is a 84 y.o. female who presents for Medicare Annual (Subsequent) preventive examination. Here with daughter   Review of Systems: No ROS.  Medicare Wellness Visit. Additional risk factors are reflected in the social history.  Cardiac Risk Factors include: advanced age (>109men, >77 women);hypertension Sleep patterns:  Sleeps well 5-6 hrs. Feels rested Home Safety/Smoke Alarms: Feels safe in home. Smoke alarms in place.  Living environment; residence and Adult nurse: lives alone in 1 story home.  Seat Belt Safety/Bike Helmet: Wears seat belt.   Female:   Pap- hysterectomy     Mammo- declined      Dexa scan- last 09/03/16         Objective:     Vitals: BP 102/60 (BP Location: Left Arm, Patient Position: Sitting, Cuff Size: Normal)   Pulse 85   Ht 5\' 3"  (1.6 m)   Wt 82 lb (37.2 kg)   SpO2 98%   BMI 14.53 kg/m   Body mass index is 14.53 kg/m.  Advanced Directives 11/05/2017 10/23/2016 03/19/2016 09/27/2015 05/04/2015 09/29/2014  Does Patient Have a Medical Advance Directive? Yes Yes Yes Yes Yes Yes  Type of Paramedic of [place];Living will [place];Living will [place];Living will [place];Living will [place];Living will Living will;Healthcare Power of Attorney  Does patient want to make changes to medical advance directive? - - - No - Patient declined - -  Copy of [place] in Chart? Yes Yes No - copy requested No - copy requested - Yes    Tobacco Social History   Tobacco Use  Smoking Status Former Smoker  Smokeless Tobacco Never Used     Counseling given: Not Answered   Clinical Intake: Pain : No/denies pain     Past Medical History:  Diagnosis Date  . Allergy   . Anemia   . Anxiety   . Cataract 05/2011  . Chronic low blood pressure    due to dehydration  . Depression   . Esophageal disorder   . Esophageal  yeast infection ([place])   . GERD (gastroesophageal reflux disease)   . Hearing loss    hearing aids  . Hyperlipidemia   . Hypothyroidism   . Myocardial infarction ([place])   . Osteoporosis   . Renal cell carcinoma ([place])    found on CT  . Thyroid disease    hypothyroidism  . Transfusion history    last 2004   Past Surgical History:  Procedure Laterality Date  . ABDOMINAL HYSTERECTOMY    . APPENDECTOMY    . BASAL CELL CARCINOMA EXCISION    . COLONOSCOPY    . CORONARY ARTERY BYPASS GRAFT     x2 vessel bypass -Dr. Burt Knack -[facility] 06-22-14  . EYE SURGERY  05/2011  . IR GENERIC HISTORICAL  08/29/2014   IR RADIOLOGIST EVAL & MGMT 08/29/2014 Aletta Edouard, MD [facility] INTERV RAD  . IR GENERIC HISTORICAL  10/22/2016   IR RADIOLOGIST EVAL & MGMT 10/22/2016 Aletta Edouard, MD [facility] INTERV RAD  . IR RADIOLOGIST EVAL & MGMT  11/03/2017  . KYPHOSIS SURGERY    . obstructed ureter     operative repair  . RENAL CRYOABLATION Right 09/2014  . TONSILLECTOMY    . TOTAL ABDOMINAL HYSTERECTOMY W/ BILATERAL SALPINGOOPHORECTOMY     Family History  Problem Relation Age of Onset  . Parkinsonism Father   . Cancer Other  breast 1st degree relative  . Hypertension Other   . Heart disease Other        CAD.Marland Kitchen1ST DEGREE RELATIVE <60  . Hyperlipidemia Other   . Colon cancer Neg Hx   . Esophageal cancer Neg Hx   . Rectal cancer Neg Hx   . Stomach cancer Neg Hx    Social History   Socioeconomic History  . Marital status: Single    Spouse name: None  . Number of children: 2  . Years of education: None  . Highest education level: None  Social Needs  . Financial resource strain: None  . Food insecurity - worry: None  . Food insecurity - inability: None  . Transportation needs - medical: None  . Transportation needs - non-medical: None  Occupational History  . Occupation: Retired    Fish farm manager: RETIRED  Tobacco Use  . Smoking status: Former Research scientist (life sciences)  . Smokeless tobacco: Never Used  Substance  and Sexual Activity  . Alcohol use: No  . Drug use: No  . Sexual activity: Not Currently  Other Topics Concern  . None  Social History Narrative   REG EXERCISE   WIDOW   LIVES ALONE   END OF LIFE:PATIENT HAS LIVING WILL AND CLEARLY STATES SHE DOES NOT WANT CARDIAC [place] VENTILATION OR OTHER HEROIC OR FUTILE MEASURES.    Outpatient Encounter Medications as of 11/05/2017  Medication Sig  . ALPRAZolam (XANAX) 0.5 MG tablet TAKE 1 TABLET BY MOUTH  TWICE A DAY AS NEEDED FOR  ANXIETY  . aspirin 81 MG EC tablet Take 81 mg by mouth daily.    . Cholecalciferol (VITAMIN D3) 1000 UNITS CAPS Take 1,000 Units by mouth daily.   . ferrous fumarate (FERRETTS) 325 (106 FE) MG TABS Take 1 tablet by mouth daily.   . fluconazole (DIFLUCAN) 100 MG tablet Take 1 tablet (100 mg total) by mouth daily.  . fluticasone (FLONASE) 50 MCG/ACT nasal spray Place 2 sprays into both nostrils daily as needed for allergies or rhinitis.  . folic acid (FOLVITE) 1 MG tablet Take 1 mg by mouth daily.    . furosemide (LASIX) 40 MG tablet TAKE 1 TABLET BY MOUTH  DAILY AS NEEDED  . levothyroxine (SYNTHROID, LEVOTHROID) 25 MCG tablet TAKE 1 TABLET BY MOUTH  DAILY BEFORE BREAKFAST  . loratadine-pseudoephedrine (CLARITIN-D 24-HOUR) 10-240 MG per 24 hr tablet Take 1 tablet by mouth daily as needed for allergies.  Marland Kitchen metoCLOPramide (REGLAN) 10 MG tablet Take 1 tablet (10 mg total) by mouth at bedtime.  . Multiple Vitamin (MULTIVITAMIN WITH MINERALS) TABS tablet Take 1 tablet by mouth daily.  Marland Kitchen nystatin (MYCOSTATIN) 100000 UNIT/ML suspension Take 10 mLs by mouth as directed. Patient uses 10 ml once weekly to soak dentures  . potassium chloride SA (K-DUR,KLOR-CON) 20 MEQ tablet TAKE 1 TABLET BY MOUTH TWO  TIMES DAILY  . promethazine (PHENERGAN) 25 MG tablet TAKE 1 TABLET BY MOUTH TWO  TIMES DAILY  . promethazine (PHENERGAN) 25 MG tablet TAKE 1 TABLET BY MOUTH TWO  TIMES DAILY  . promethazine (PHENERGAN) 25 MG tablet  Take 1 tablet (25 mg total) by mouth 2 (two) times daily.  . ranitidine (ZANTAC) 150 MG tablet Take 150 mg by mouth 2 (two) times daily.  Marland Kitchen rOPINIRole (REQUIP) 1 MG tablet TAKE 1 TABLET BY MOUTH AT  BEDTIME  . simvastatin (ZOCOR) 20 MG tablet TAKE 1 TABLET BY MOUTH  DAILY  . tiZANidine (ZANAFLEX) 2 MG tablet Take 2 mg by mouth every 3 (three) hours  as needed for muscle spasms.   . traMADol (ULTRAM) 50 MG tablet Take 1 tablet (50 mg total) by mouth every 6 (six) hours as needed (pain).  . [DISCONTINUED] ALPRAZolam (XANAX) 0.5 MG tablet TAKE 1 TABLET BY MOUTH  TWICE A DAY AS NEEDED FOR  ANXIETY  . [DISCONTINUED] fluconazole (DIFLUCAN) 100 MG tablet TAKE 1 TABLET BY MOUTH  DAILY  . [DISCONTINUED] metoCLOPramide (REGLAN) 10 MG tablet TAKE 1 TABLET BY MOUTH AT  BEDTIME  . [DISCONTINUED] promethazine (PHENERGAN) 25 MG tablet TAKE 1 TABLET BY MOUTH TWO  TIMES DAILY  . [DISCONTINUED] traMADol (ULTRAM) 50 MG tablet Take 1 tablet (50 mg total) by mouth every 6 (six) hours as needed (pain).   No facility-administered encounter medications on file as of 11/05/2017.     Activities of Daily Living In your present state of health, do you have any difficulty performing the following activities: 11/05/2017  Hearing? Y  Comment hearing aid right ear  Vision? N  Comment hx cataract sx. wears readers. Dr.Hecker every 6 months  Difficulty concentrating or making decisions? N  Walking or climbing stairs? Y  Comment slow per pt  Dressing or bathing? N  Doing errands, shopping? N  Preparing Food and eating ? N  Using the Toilet? N  In the past six months, have you accidently leaked urine? Y  Comment wears pads  Do you have problems with loss of bowel control? N  Managing your Medications? N  Managing your Finances? N  Housekeeping or managing your Housekeeping? N  Some recent data might be hidden    Patient Care Team: Carollee Herter, Alferd Apa, DO as PCP - General Milus Banister, MD as Consulting  Physician (Gastroenterology) Aletta Edouard, MD as Consulting Physician (Interventional Radiology) [place]ique, Amy, MD as Consulting Physician (Dermatology) Sherren Mocha, MD as Consulting Physician (Cardiology) Monna Fam, MD as Consulting Physician (Ophthalmology)    Assessment:   This is a routine wellness examination for Mandeep.  Exercise Activities and Dietary recommendations Current Exercise Habits: Home exercise routine, Type of exercise: walking, Time (Minutes): 45, Frequency (Times/Week): 7, Weekly Exercise (Minutes/Week): 315, Intensity: Mild  Diet (meal preparation, eat out, water intake, caffeinated beverages, dairy products, fruits and vegetables): well balanced       Goals    . Maintain current health and independence       Fall Risk Fall Risk  11/05/2017 11/05/2017 10/23/2016 09/27/2015 09/27/2015  Falls in the past year? No Yes Yes Yes Yes  Number falls in past yr: - - 1 1 1   Comment - - Slid off sofa reaching for walker. - -  Injury with Fall? - No No Yes Yes  Risk Factor Category  - - - High Fall Risk -  Risk for fall due to : - Other (Comment);Impaired balance/gait - Impaired balance/gait;Impaired mobility -  Follow up - - - Education provided -     Depression Screen PHQ 2/9 Scores 11/05/2017 10/23/2016 09/27/2015 09/27/2015  PHQ - 2 Score 0 0 0 0     Cognitive Function MMSE - Mini Mental State Exam 11/05/2017 10/23/2016  Orientation to time 5 5  Orientation to Place 5 5  Registration 3 3  Attention/ Calculation 5 5  Recall 3 3  Language- name 2 objects 2 2  Language- repeat 1 1  Language- follow 3 step command 3 3  Language- read & follow direction 1 1  Write a sentence 1 1  Copy design 1 1  Total score 30 30  Immunization History  Administered Date(s) Administered  . Influenza Split 05/26/2011, 05/31/2012  . Influenza Whole 05/31/2008, 05/17/2009, 05/22/2010  . Influenza, High Dose Seasonal PF 05/21/2014, 04/30/2015, 05/18/2016  .  Influenza,inj,Quad PF,6+ Mos 06/06/2013  . Pneumococcal Conjugate-13 08/11/2014  . Pneumococcal Polysaccharide-23 06/11/2005  . Tdap 06/08/2016    Screening Tests Health Maintenance  Topic Date Due  . TETANUS/TDAP  06/08/2026  . INFLUENZA VACCINE  Completed  . DEXA SCAN  Completed  . PNA vac Low Risk Adult  Completed      Plan:   Follow up with Dr.Lowne as directed  Continue to eat heart healthy diet (full of fruits, vegetables, whole grains, lean protein, water--limit salt, fat, and sugar intake) and increase physical activity as tolerated.  Continue doing brain stimulating activities (puzzles, reading, adult coloring books, staying active) to keep memory sharp.    I have personally reviewed and noted the following in the patient's chart:   . Medical and social history . Use of alcohol, tobacco or illicit drugs  . Current medications and supplements . Functional ability and status . Nutritional status . Physical activity . Advanced directives . List of other physicians . Hospitalizations, surgeries, and ER visits in previous 12 months . Vitals . Screenings to include cognitive, depression, and falls . Referrals and appointments  In addition, I have reviewed and discussed with patient certain preventive protocols, quality metrics, and best practice recommendations. A written personalized care plan for preventive services as well as general preventive health recommendations were provided to patient.     Shela Nevin, [place]  11/05/2017

## 2017-11-03 ENCOUNTER — Ambulatory Visit
Admission: RE | Admit: 2017-11-03 | Discharge: 2017-11-03 | Disposition: A | Discharge status: Home / Self Care | Payer: MEDICARE | Source: Ambulatory Visit | Attending: Interventional Radiology | Admitting: Interventional Radiology

## 2017-11-03 ENCOUNTER — Encounter (HOSPITAL_COMMUNITY): Payer: Self-pay

## 2017-11-03 ENCOUNTER — Ambulatory Visit (HOSPITAL_COMMUNITY)
Admission: RE | Admit: 2017-11-03 | Discharge: 2017-11-03 | Disposition: A | Discharge status: Home / Self Care | Payer: MEDICARE | Source: Ambulatory Visit | Attending: Interventional Radiology | Admitting: Interventional Radiology

## 2017-11-03 DIAGNOSIS — K8020 Calculus of gallbladder without cholecystitis without obstruction: Secondary | ICD-10-CM | POA: Insufficient documentation

## 2017-11-03 DIAGNOSIS — Z9889 Other specified postprocedural states: Secondary | ICD-10-CM | POA: Diagnosis not present

## 2017-11-03 DIAGNOSIS — N2889 Other specified disorders of kidney and ureter: Secondary | ICD-10-CM | POA: Insufficient documentation

## 2017-11-03 DIAGNOSIS — I700 Atherosclerosis of aorta: Secondary | ICD-10-CM | POA: Diagnosis not present

## 2017-11-03 DIAGNOSIS — K862 Cyst of pancreas: Secondary | ICD-10-CM | POA: Diagnosis not present

## 2017-11-03 DIAGNOSIS — N200 Calculus of kidney: Secondary | ICD-10-CM | POA: Insufficient documentation

## 2017-11-03 HISTORY — PX: IR RADIOLOGIST EVAL & MGMT: IMG5224

## 2017-11-03 LAB — POCT I-STAT CREATININE: Creatinine, Ser: 1.4 mg/dL — ABNORMAL HIGH (ref 0.44–1.00)

## 2017-11-03 MED ORDER — IOPAMIDOL (ISOVUE-300) INJECTION 61%
INTRAVENOUS | Status: AC
  Filled 2017-11-03: qty 75

## 2017-11-03 MED ORDER — SODIUM CHLORIDE 0.9 % IJ SOLN
INTRAMUSCULAR | Status: AC
  Filled 2017-11-03: qty 50

## 2017-11-03 MED ORDER — IOPAMIDOL (ISOVUE-300) INJECTION 61%
75.0000 mL | Freq: Once | INTRAVENOUS | Status: AC | PRN
  Administered 2017-11-03: 75 mL via INTRAVENOUS

## 2017-11-03 NOTE — Progress Notes (Signed)
Chief Complaint: Status post cryoablation of a right renal mass on 10/06/2014.   History of Present Illness: [name] is a 84 y.o. female now 3 years status post percutaneous cryoablation of a 2 cm right renal neoplasm on 10/06/2014. She has done well after the procedure with no evidence of tumor recurrence. Two small cystic lesions of the head of the pancreas of also been followed over this time interval by CT. She has seen Dr. Ardis Hughs previously and last saw him in July, 2015.  Past Medical History:  Diagnosis Date  . Allergy   . Anemia   . Anxiety   . Cataract 05/2011  . Chronic low blood pressure    due to dehydration  . Depression   . Esophageal disorder   . Esophageal yeast infection ([place])   . GERD (gastroesophageal reflux disease)   . Hearing loss    hearing aids  . Hyperlipidemia   . Hypothyroidism   . Myocardial infarction ([place])   . Osteoporosis   . Renal cell carcinoma ([place])    found on CT  . Thyroid disease    hypothyroidism  . Transfusion history    last 2004    Past Surgical History:  Procedure Laterality Date  . ABDOMINAL HYSTERECTOMY    . APPENDECTOMY    . BASAL CELL CARCINOMA EXCISION    . COLONOSCOPY    . CORONARY ARTERY BYPASS GRAFT     x2 vessel bypass -Dr. Burt Knack -[facility] 06-22-14  . EYE SURGERY  05/2011  . IR GENERIC HISTORICAL  08/29/2014   IR RADIOLOGIST EVAL & MGMT 08/29/2014 Aletta Edouard, MD [facility] INTERV RAD  . IR GENERIC HISTORICAL  10/22/2016   IR RADIOLOGIST EVAL & MGMT 10/22/2016 Aletta Edouard, MD [facility] INTERV RAD  . IR RADIOLOGIST EVAL & MGMT  11/03/2017  . KYPHOSIS SURGERY    . obstructed ureter     operative repair  . RENAL CRYOABLATION Right 09/2014  . TONSILLECTOMY    . TOTAL ABDOMINAL HYSTERECTOMY W/ BILATERAL SALPINGOOPHORECTOMY      Allergies: Zofran [ondansetron hcl]; Clarithromycin; Codeine; Diazepam; Ondansetron; Sulfonamide derivatives; Prilosec [omeprazole magnesium]; and Trazodone  Medications: Prior to  Admission medications   Medication Sig Start Date End Date Taking? Authorizing Provider  ALPRAZolam Duanne Moron) 0.5 MG tablet TAKE 1 TABLET BY MOUTH  TWICE A DAY AS NEEDED FOR  ANXIETY 10/06/17   Carollee Herter, Alferd Apa, DO  aspirin 81 MG EC tablet Take 81 mg by mouth daily.      [provider]  Cholecalciferol (VITAMIN D3) 1000 UNITS CAPS Take 1,000 Units by mouth daily.     [provider]  ferrous fumarate (FERRETTS) 325 (106 FE) MG TABS Take 1 tablet by mouth daily.     [provider]  fluconazole (DIFLUCAN) 100 MG tablet TAKE 1 TABLET BY MOUTH  DAILY 10/06/17   Carollee Herter, Alferd Apa, DO  fluticasone (FLONASE) 50 MCG/ACT nasal spray Place 2 sprays into both nostrils daily as needed for allergies or rhinitis.    [provider]  folic acid (FOLVITE) 1 MG tablet Take 1 mg by mouth daily.      [provider]  furosemide (LASIX) 40 MG tablet TAKE 1 TABLET BY MOUTH  DAILY AS NEEDED 09/14/17   Carollee Herter, Alferd Apa, DO  levothyroxine (SYNTHROID, LEVOTHROID) 25 MCG tablet TAKE 1 TABLET BY MOUTH  DAILY BEFORE BREAKFAST 09/14/17   Carollee Herter, Alferd Apa, DO  loratadine-pseudoephedrine (CLARITIN-D 24-HOUR) 10-240 MG per 24 hr  tablet Take 1 tablet by mouth daily as needed for allergies.    [provider]  metoCLOPramide (REGLAN) 10 MG tablet TAKE 1 TABLET BY MOUTH AT  BEDTIME 10/06/17   Lowne Chase, Yvonne R, DO  Multiple Vitamin (MULTIVITAMIN WITH MINERALS) TABS tablet Take 1 tablet by mouth daily.    [provider]  nystatin (MYCOSTATIN) 100000 UNIT/ML suspension Take 10 mLs by mouth as directed. Patient uses 10 ml once weekly to soak dentures    [provider]  potassium chloride SA (K-DUR,KLOR-CON) 20 MEQ tablet TAKE 1 TABLET BY MOUTH TWO  TIMES DAILY 09/14/17   Lowne Chase, Yvonne R, DO  promethazine (PHENERGAN) 25 MG tablet TAKE 1 TABLET BY MOUTH TWO  TIMES DAILY 03/30/17   Lowne Chase, Yvonne R, DO  promethazine (PHENERGAN) 25 MG tablet  TAKE 1 TABLET BY MOUTH TWO  TIMES DAILY 07/27/17   Lowne Chase, Yvonne R, DO  promethazine (PHENERGAN) 25 MG tablet TAKE 1 TABLET BY MOUTH TWO  TIMES DAILY 10/06/17   Lowne Chase, Yvonne R, DO  ranitidine (ZANTAC) 150 MG tablet Take 150 mg by mouth 2 (two) times daily.    [provider]  rOPINIRole (REQUIP) 1 MG tablet TAKE 1 TABLET BY MOUTH AT  BEDTIME 09/14/17   Lowne Chase, Yvonne R, DO  simvastatin (ZOCOR) 20 MG tablet TAKE 1 TABLET BY MOUTH  DAILY 09/14/17   Lowne Chase, Yvonne R, DO  tiZANidine (ZANAFLEX) 2 MG tablet Take 2 mg by mouth every 3 (three) hours as needed for muscle spasms.     [provider]  traMADol (ULTRAM) 50 MG tablet Take 1 tablet (50 mg total) by mouth every 6 (six) hours as needed (pain). 06/26/17   Lowne Chase, Yvonne R, DO     Family History  Problem Relation Age of Onset  . Parkinsonism Father   . Cancer Other        breast 1st degree relative  . Hypertension Other   . Heart disease Other        CAD..1ST DEGREE RELATIVE <60  . Hyperlipidemia Other   . Colon cancer Neg Hx   . Esophageal cancer Neg Hx   . Rectal cancer Neg Hx   . Stomach cancer Neg Hx     Social History   Socioeconomic History  . Marital status: Single    Spouse name: Not on file  . Number of children: 2  . Years of education: Not on file  . Highest education level: Not on file  Social Needs  . Financial resource strain: Not on file  . Food insecurity - worry: Not on file  . Food insecurity - inability: Not on file  . Transportation needs - medical: Not on file  . Transportation needs - non-medical: Not on file  Occupational History  . Occupation: Retired    Employer: RETIRED  Tobacco Use  . Smoking status: Former Smoker  . Smokeless tobacco: Never Used  Substance and Sexual Activity  . Alcohol use: No  . Drug use: No  . Sexual activity: Not Currently  Other Topics Concern  . Not on file  Social History Narrative   REG EXERCISE   [place]IDOW   LIVES ALONE    END OF LIFE:PATIENT HAS LIVING WILL AND CLEARLY STATES SHE DOES NOT WANT CARDIAC RESUSCITATION,MECHANICLA VENTILATION OR OTHER HEROIC OR FUTILE MEASURES.    ECOG Status: 0 - Asymptomatic  Review of Systems: A 12 point ROS discussed and pertinent positives are indicated in the   HPI above.  All other systems are negative.  Review of Systems  Constitutional: Negative.   HENT: Negative.   Respiratory: Negative.   Cardiovascular: Negative.   Gastrointestinal: Negative.   Genitourinary: Negative.   Musculoskeletal: Negative.   Neurological: Negative.     Vital Signs: BP (!) 96/47   Pulse 78   Temp 97.7 F (36.5 C) (Oral)   Resp 16   Ht 5' 3" (1.6 m)   Wt 95 lb (43.1 kg)   SpO2 97%   BMI 16.83 kg/m   Physical Exam  Constitutional: She is oriented to person, place, and time. No distress.  Neurological: She is alert and oriented to person, place, and time.  Skin: She is not diaphoretic.  Vitals reviewed.   Imaging: Ct Abdomen W Wo Contrast  Result Date: 11/03/2017 CLINICAL DATA:  Status post right renal cryoablation. EXAM: CT ABDOMEN WITHOUT AND WITH CONTRAST TECHNIQUE: Multidetector CT imaging of the abdomen was performed following the standard protocol before and following the bolus administration of intravenous contrast. CONTRAST:  13m ISOVUE-300 IOPAMIDOL (ISOVUE-300) INJECTION 61% COMPARISON:  10/22/2016 FINDINGS: Lower chest: Calcified granuloma identified in the right lower lobe man Hepatobiliary: No focal liver abnormality. 5 mm gallstone identified. No biliary dilatation. Pancreas: Two simple appearing unilocular cysts without internal enhancement identified within the head of pancreas. The largest demonstrates rapid growth from the previous exam currently measuring 1.6 cm, image 28/12. On 10/22/16 this measured 4 mm. The smaller lesion measures 1 cm and is unchanged from the previous exam. No pancreatic ductal dilatation. Spleen: Normal appearance of the spleen.  Adrenals/Urinary Tract: The adrenal glands are normal. The cryoablation zone within the interpolar right kidney is again noted, image 49/series 6. Small area of hyperdensity within the medial aspect of the cryoablation zone is again noted measuring 1.1 cm. This measures 40 HU precontrast and 70 HU on the arterial phase images, similar in size to previous exam. Stable adjacent cyst measuring 1.2 cm. Unchanged intermediate attenuating cyst within the posterior cortex of the left kidney measuring 1 cm. Small hypodensity within the upper pole of the left kidney measures 5 mm and is too small to characterize. Two stones identified within the left renal collecting system. The largest is in the interpolar region measuring 8 mm, image 25/2. Stomach/Bowel: Stomach is within normal limits. No evidence of bowel wall thickening, distention, or inflammatory changes. Vascular/Lymphatic: Aortic atherosclerosis. No aneurysm. Increase caliber of the portal vein is identified which measures up to 1.6 cm. No upper abdominal adenopathy. Other: No free fluid or fluid collections identified within the abdomen. Musculoskeletal: No suspicious bone lesions. Hit treated compression deformity at L1. Stable from previous exam. IMPRESSION: 1. Stable appearance of cryo ablation zone within the right kidney. The small residual, possibly enhancing ovoid hyperdense focus within the ablation zone is unchanged. 2. No evidence for metastatic disease within the abdomen. 3. Two cystic lesions are noted within the head of pancreas. One of these demonstrates rapid growth from 10/22/2016 currently measuring 1.9 cm, 4 mm previously. According to consensus criteria given the interval growth from 10/22/2016 further investigation with endoscopic ultrasound and/or FNA is advised. This recommendation follows ACR consensus guidelines: Management of Incidental Pancreatic Cysts: A White Paper of the ACR Incidental Findings Committee. [place] [id number] 4. Gallstone 5. Left kidney stones 6.  Aortic Atherosclerosis (ICD10-I70.0). Electronically Signed   By: TKerby MoorsM.D.   On: 11/03/2017 08:50   Ir Radiologist Eval & Mgmt  Result Date: 11/03/2017 Please refer  to notes tab for details about interventional procedure. (Op Note)   Labs:  CBC: No results for input(s): WBC, HGB, HCT, PLT in the last 8760 hours.  COAGS: No results for input(s): INR, APTT in the last 8760 hours.  BMP: Recent Labs    01/05/17 0730 05/15/17 0723 11/03/17 0747  NA 137 139  --   K 3.8 4.4  --   CL 106 105  --   CO2 21 26  --   GLUCOSE 98 104*  --   BUN 18 10  --   CALCIUM 9.2 9.3  --   CREATININE 1.28* 0.96 1.40*    LIVER FUNCTION TESTS: Recent Labs    05/15/17 0723  BILITOT 0.5  AST 23  ALT 18  ALKPHOS 49  PROT 6.0  ALBUMIN 3.6    Assessment and Plan:  I met with Mrs. Mcnicholas and her daughter [name]. We reviewed the follow-up CT performed earlier today which demonstrates a stable right-sided ablation defect with no evidence of recurrent or residual enhancing tumor. Suggestion of focal enhancement along the deep margin of ablation remained stable and is felt to most likely represent normal renal tissue. There is a finding on the study of significant enlargement of one of 2 cysts along the anterior margin of the head of the pancreas. One of these now measures 1.6 cm in greatest diameter and previously measured under 1 cm. This continues to show features of a fairly simple appearing cyst by CT without evidence of internal enhancement or septation. I told Mrs. Vancamp that I will refer her back to Dr. Ardis Hughs for his opinion regarding further workup of the enlarging pancreatic cyst.  I recommended another follow-up CT scan in one year to follow the right renal ablation site.  Electronically SignedAletta Edouard T 11/03/2017, 11:04 AM   I spent a total of 15 Minutes in face to face in clinical consultation, greater than 50%  of which was counseling/coordinating care post ablation of a right renal mass.

## 2017-11-04 ENCOUNTER — Telehealth: Payer: Self-pay | Admitting: Gastroenterology

## 2017-11-04 NOTE — Telephone Encounter (Signed)
The pt will have her daughter call and make the appt.

## 2017-11-04 NOTE — Telephone Encounter (Signed)
Nurse Mallie Mussel from [facility] office calling due to patient needing to do follow up with Dr.Jacobs for enlargement of pancreatic cyst. Mallie Mussel states Dr.Yamagata sent a note to Dr.Jacobs yesterday in Epic and also results of CT are in Epic. Nurse just wanting Dr.Jacobs to review and verify urgency of appointment. Pt last seen 2015.

## 2017-11-04 NOTE — Telephone Encounter (Signed)
Please offer her my next available ROV to discuss the pancreas, consider further testing.  Add her to office wait list as well in case a sooner appt opens up.  thanks

## 2017-11-04 NOTE — Telephone Encounter (Signed)
Dr Jacobs please advise  

## 2017-11-05 ENCOUNTER — Ambulatory Visit (INDEPENDENT_AMBULATORY_CARE_PROVIDER_SITE_OTHER): Payer: MEDICARE | Admitting: *Deleted

## 2017-11-05 ENCOUNTER — Ambulatory Visit (INDEPENDENT_AMBULATORY_CARE_PROVIDER_SITE_OTHER): Payer: MEDICARE | Admitting: Family Medicine

## 2017-11-05 ENCOUNTER — Encounter: Payer: Self-pay | Admitting: Family Medicine

## 2017-11-05 ENCOUNTER — Encounter: Payer: Self-pay | Admitting: *Deleted

## 2017-11-05 VITALS — BP 102/60 | HR 85 | Ht 63.0 in | Wt 82.0 lb

## 2017-11-05 VITALS — BP 102/60 | HR 88 | Temp 97.8°F | Resp 16 | Ht 63.0 in | Wt 82.2 lb

## 2017-11-05 DIAGNOSIS — R110 Nausea: Secondary | ICD-10-CM | POA: Diagnosis not present

## 2017-11-05 DIAGNOSIS — Z0000 Encounter for general adult medical examination without abnormal findings: Secondary | ICD-10-CM

## 2017-11-05 DIAGNOSIS — E039 Hypothyroidism, unspecified: Secondary | ICD-10-CM

## 2017-11-05 DIAGNOSIS — E785 Hyperlipidemia, unspecified: Secondary | ICD-10-CM

## 2017-11-05 DIAGNOSIS — M159 Polyosteoarthritis, unspecified: Secondary | ICD-10-CM

## 2017-11-05 DIAGNOSIS — Z79899 Other long term (current) drug therapy: Secondary | ICD-10-CM | POA: Diagnosis not present

## 2017-11-05 DIAGNOSIS — I252 Old myocardial infarction: Secondary | ICD-10-CM

## 2017-11-05 DIAGNOSIS — I2510 Atherosclerotic heart disease of native coronary artery without angina pectoris: Secondary | ICD-10-CM | POA: Diagnosis not present

## 2017-11-05 DIAGNOSIS — F419 Anxiety disorder, unspecified: Secondary | ICD-10-CM

## 2017-11-05 LAB — COMPREHENSIVE METABOLIC PANEL
ALT: 30 U/L (ref 0–35)
AST: 33 U/L (ref 0–37)
Albumin: 3.9 g/dL (ref 3.5–5.2)
Alkaline Phosphatase: 59 U/L (ref 39–117)
BILIRUBIN TOTAL: 0.3 mg/dL (ref 0.2–1.2)
BUN: 21 mg/dL (ref 6–23)
CALCIUM: 9.1 mg/dL (ref 8.4–10.5)
CHLORIDE: 105 meq/L (ref 96–112)
CO2: 24 mEq/L (ref 19–32)
Creatinine, Ser: 1.28 mg/dL — ABNORMAL HIGH (ref 0.40–1.20)
GFR: 41.87 mL/min — AB (ref >60.00)
GLUCOSE: 98 mg/dL (ref 70–99)
POTASSIUM: 3.3 meq/L — AB (ref 3.5–5.1)
Sodium: 139 mEq/L (ref 135–145)
Total Protein: 6.7 g/dL (ref 6.0–8.3)

## 2017-11-05 LAB — CBC WITH DIFFERENTIAL/PLATELET
BASOS PCT: 0.6 % (ref 0.0–3.0)
Basophils Absolute: 0 10*3/uL (ref 0.0–0.1)
Eosinophils Absolute: 0 10*3/uL (ref 0.0–0.7)
Eosinophils Relative: 0.6 % (ref 0.0–5.0)
HEMATOCRIT: 39.2 % (ref 36.0–46.0)
Hemoglobin: 12.9 g/dL (ref 12.0–15.0)
LYMPHS ABS: 2.3 10*3/uL (ref 0.7–4.0)
Lymphocytes Relative: 31.5 % (ref 12.0–46.0)
MCHC: 32.8 g/dL (ref 30.0–36.0)
MCV: 97.2 fl (ref 78.0–100.0)
MONOS PCT: 7.9 % (ref 3.0–12.0)
Monocytes Absolute: 0.6 10*3/uL (ref 0.1–1.0)
NEUTROS ABS: 4.4 10*3/uL (ref 1.4–7.7)
NEUTROS PCT: 59.4 % (ref 43.0–77.0)
PLATELETS: 333 10*3/uL (ref 150.0–400.0)
RBC: 4.03 Mil/uL (ref 3.87–5.11)
RDW: 13.5 % (ref 11.5–15.5)
WBC: 7.4 10*3/uL (ref 4.0–10.5)

## 2017-11-05 LAB — LIPID PANEL
Cholesterol: 188 mg/dL (ref 0–200)
HDL: 70.7 mg/dL (ref >39.00)
LDL CALC: 97 mg/dL (ref 0–99)
NONHDL: 117.32
TRIGLYCERIDES: 103 mg/dL (ref 0.0–149.0)
Total CHOL/HDL Ratio: 3
VLDL: 20.6 mg/dL (ref 0.0–40.0)

## 2017-11-05 LAB — TSH: TSH: 3.49 u[IU]/mL (ref 0.35–4.50)

## 2017-11-05 MED ORDER — PROMETHAZINE HCL 25 MG PO TABS: 25.0000 mg | ORAL_TABLET | Freq: Two times a day (BID) | ORAL | 1 refills | Status: DC

## 2017-11-05 MED ORDER — METOCLOPRAMIDE HCL 10 MG PO TABS: 10.0000 mg | ORAL_TABLET | Freq: Every day | ORAL | 1 refills | Status: DC

## 2017-11-05 MED ORDER — TRAMADOL HCL 50 MG PO TABS: 50.0000 mg | ORAL_TABLET | Freq: Four times a day (QID) | ORAL | 1 refills | Status: DC | PRN

## 2017-11-05 MED ORDER — ALPRAZOLAM 0.5 MG PO TABS: ORAL_TABLET | ORAL | 0 refills | Status: DC

## 2017-11-05 MED ORDER — FLUCONAZOLE 100 MG PO TABS: 100.0000 mg | ORAL_TABLET | Freq: Every day | ORAL | 1 refills | Status: DC

## 2017-11-05 NOTE — Assessment & Plan Note (Signed)
Stable -- no complaints ekg-- no acute changes

## 2017-11-05 NOTE — Patient Instructions (Signed)
Preventive Care 65 Years and Older, Female Preventive care refers to lifestyle choices and visits with your health care provider that can promote health and wellness. What does preventive care include?  A yearly physical exam. This is also called an annual well check.  Dental exams once or twice a year.  Routine eye exams. Ask your health care provider how often you should have your eyes checked.  Personal lifestyle choices, including: ? Daily care of your teeth and gums. ? Regular physical activity. ? Eating a healthy diet. ? Avoiding tobacco and drug use. ? Limiting alcohol use. ? Practicing safe sex. ? Taking low-dose aspirin every day. ? Taking vitamin and mineral supplements as recommended by your health care provider. What happens during an annual well check? The services and screenings done by your health care provider during your annual well check will depend on your age, overall health, lifestyle risk factors, and family history of disease. Counseling Your health care provider may ask you questions about your:  Alcohol use.  Tobacco use.  Drug use.  Emotional well-being.  Home and relationship well-being.  Sexual activity.  Eating habits.  History of falls.  Memory and ability to understand (cognition).  Work and work environment.  Reproductive health.  Screening You may have the following tests or measurements:  Height, weight, and BMI.  Blood pressure.  Lipid and cholesterol levels. These may be checked every 5 years, or more frequently if you are over 50 years old.  Skin check.  Lung cancer screening. You may have this screening every year starting at age 55 if you have a 30-pack-year history of smoking and currently smoke or have quit within the past 15 years.  Fecal occult blood test (FOBT) of the stool. You may have this test every year starting at age 50.  Flexible sigmoidoscopy or colonoscopy. You may have a sigmoidoscopy every 5 years or  a colonoscopy every 10 years starting at age 50.  Hepatitis C blood test.  Hepatitis B blood test.  Sexually transmitted disease (STD) testing.  Diabetes screening. This is done by checking your blood sugar (glucose) after you have not eaten for a while (fasting). You may have this done every 1-3 years.  Bone density scan. This is done to screen for osteoporosis. You may have this done starting at age 65.  Mammogram. This may be done every 1-2 years. Talk to your health care provider about how often you should have regular mammograms.  Talk with your health care provider about your test results, treatment options, and if necessary, the need for more tests. Vaccines Your health care provider may recommend certain vaccines, such as:  Influenza vaccine. This is recommended every year.  Tetanus, diphtheria, and acellular pertussis (Tdap, Td) vaccine. You may need a Td booster every 10 years.  Varicella vaccine. You may need this if you have not been vaccinated.  Zoster vaccine. You may need this after age 60.  Measles, mumps, and rubella (MMR) vaccine. You may need at least one dose of MMR if you were born in 1957 or later. You may also need a second dose.  Pneumococcal 13-valent conjugate (PCV13) vaccine. One dose is recommended after age 65.  Pneumococcal polysaccharide (PPSV23) vaccine. One dose is recommended after age 65.  Meningococcal vaccine. You may need this if you have certain conditions.  Hepatitis A vaccine. You may need this if you have certain conditions or if you travel or work in places where you may be exposed to hepatitis   A.  Hepatitis B vaccine. You may need this if you have certain conditions or if you travel or work in places where you may be exposed to hepatitis B.  Haemophilus influenzae type b (Hib) vaccine. You may need this if you have certain conditions.  Talk to your health care provider about which screenings and vaccines you need and how often you  need them. This information is not intended to replace advice given to you by your health care provider. Make sure you discuss any questions you have with your health care provider. Document Released: 09/07/2015 Document Revised: 04/30/2016 Document Reviewed: 06/12/2015 Elsevier Interactive Patient Education  2018 [employer]  

## 2017-11-05 NOTE — Patient Instructions (Signed)
Continue to eat heart healthy diet (full of fruits, vegetables, whole grains, lean protein, water--limit salt, fat, and sugar intake) and increase physical activity as tolerated.  Continue doing brain stimulating activities (puzzles, reading, adult coloring books, staying active) to keep memory sharp.    [name] , Thank you for taking time to come for your Medicare Wellness Visit. I appreciate your ongoing commitment to your health goals. Please review the following plan we discussed and let me know if I can assist you in the future.   These are the goals we discussed: Goals    . Maintain current health and independence       This is a list of the screening recommended for you and due dates:  Health Maintenance  Topic Date Due  . Tetanus Vaccine  06/08/2026  . Flu Shot  Completed  . DEXA scan (bone density measurement)  Completed  . Pneumonia vaccines  Completed    Health Maintenance for Postmenopausal Women Menopause is a normal process in which your reproductive ability comes to an end. This process happens gradually over a span of months to years, usually between the ages of 69 and 68. Menopause is complete when you have missed 12 consecutive menstrual periods. It is important to talk with your health care provider about some of the most common conditions that affect postmenopausal women, such as heart disease, cancer, and bone loss (osteoporosis). Adopting a healthy lifestyle and getting preventive care can help to promote your health and wellness. Those actions can also lower your chances of developing some of these common conditions. What should I know about menopause? During menopause, you may experience a number of symptoms, such as:  Moderate-to-severe hot flashes.  Night sweats.  Decrease in sex drive.  Mood swings.  Headaches.  Tiredness.  Irritability.  Memory problems.  Insomnia.  Choosing to treat or not to treat menopausal changes is an individual  decision that you make with your health care provider. What should I know about hormone replacement therapy and supplements? Hormone therapy products are effective for treating symptoms that are associated with menopause, such as hot flashes and night sweats. Hormone replacement carries certain risks, especially as you become older. If you are thinking about using estrogen or estrogen with progestin treatments, discuss the benefits and risks with your health care provider. What should I know about heart disease and stroke? Heart disease, heart attack, and stroke become more likely as you age. This may be due, in part, to the hormonal changes that your body experiences during menopause. These can affect how your body processes dietary fats, triglycerides, and cholesterol. Heart attack and stroke are both medical emergencies. There are many things that you can do to help prevent heart disease and stroke:  Have your blood pressure checked at least every 1-2 years. High blood pressure causes heart disease and increases the risk of stroke.  If you are 66-42 years old, ask your health care provider if you should take aspirin to prevent a heart attack or a stroke.  Do not use any tobacco products, including cigarettes, chewing tobacco, or electronic cigarettes. If you need help quitting, ask your health care provider.  It is important to eat a healthy diet and maintain a healthy weight. ? Be sure to include plenty of vegetables, fruits, low-fat dairy products, and lean protein. ? Avoid eating foods that are high in solid fats, added sugars, or salt (sodium).  Get regular exercise. This is one of the most important things  that you can do for your health. ? Try to exercise for at least 150 minutes each week. The type of exercise that you do should increase your heart rate and make you sweat. This is known as moderate-intensity exercise. ? Try to do strengthening exercises at least twice each week. Do these  in addition to the moderate-intensity exercise.  Know your numbers.Ask your health care provider to check your cholesterol and your blood glucose. Continue to have your blood tested as directed by your health care provider.  What should I know about cancer screening? There are several types of cancer. Take the following steps to reduce your risk and to catch any cancer development as early as possible. Breast Cancer  Practice breast self-awareness. ? This means understanding how your breasts normally appear and feel. ? It also means doing regular breast self-exams. Let your health care provider know about any changes, no matter how small.  If you are 27 or older, have a clinician do a breast exam (clinical breast exam or CBE) every year. Depending on your age, family history, and medical history, it may be recommended that you also have a yearly breast X-ray (mammogram).  If you have a family history of breast cancer, talk with your health care provider about genetic screening.  If you are at high risk for breast cancer, talk with your health care provider about having an MRI and a mammogram every year.  Breast cancer (BRCA) gene test is recommended for women who have family members with BRCA-related cancers. Results of the assessment will determine the need for genetic counseling and BRCA1 and for BRCA2 testing. BRCA-related cancers include these types: ? Breast. This occurs in males or females. ? Ovarian. ? Tubal. This may also be called fallopian tube cancer. ? Cancer of the abdominal or pelvic lining (peritoneal cancer). ? Prostate. ? Pancreatic.  Cervical, Uterine, and Ovarian Cancer Your health care provider may recommend that you be screened regularly for cancer of the pelvic organs. These include your ovaries, uterus, and vagina. This screening involves a pelvic exam, which includes checking for microscopic changes to the surface of your cervix (Pap test).  For women ages 21-65,  health care providers may recommend a pelvic exam and a Pap test every three years. For women ages 6-65, they may recommend the Pap test and pelvic exam, combined with testing for human papilloma virus (HPV), every five years. Some types of HPV increase your risk of cervical cancer. Testing for HPV may also be done on women of any age who have unclear Pap test results.  Other health care providers may not recommend any screening for nonpregnant women who are considered low risk for pelvic cancer and have no symptoms. Ask your health care provider if a screening pelvic exam is right for you.  If you have had past treatment for cervical cancer or a condition that could lead to cancer, you need Pap tests and screening for cancer for at least 20 years after your treatment. If Pap tests have been discontinued for you, your risk factors (such as having a new sexual partner) need to be reassessed to determine if you should start having screenings again. Some women have medical problems that increase the chance of getting cervical cancer. In these cases, your health care provider may recommend that you have screening and Pap tests more often.  If you have a family history of uterine cancer or ovarian cancer, talk with your health care provider about genetic screening.  If you have vaginal bleeding after reaching menopause, tell your health care provider.  There are currently no reliable tests available to screen for ovarian cancer.  Lung Cancer Lung cancer screening is recommended for adults 61-44 years old who are at high risk for lung cancer because of a history of smoking. A yearly low-dose CT scan of the lungs is recommended if you:  Currently smoke.  Have a history of at least 30 pack-years of smoking and you currently smoke or have quit within the past 15 years. A pack-year is smoking an average of one pack of cigarettes per day for one year.  Yearly screening should:  Continue until it has been  15 years since you quit.  Stop if you develop a health problem that would prevent you from having lung cancer treatment.  Colorectal Cancer  This type of cancer can be detected and can often be prevented.  Routine colorectal cancer screening usually begins at age 62 and continues through age 66.  If you have risk factors for colon cancer, your health care provider may recommend that you be screened at an earlier age.  If you have a family history of colorectal cancer, talk with your health care provider about genetic screening.  Your health care provider may also recommend using home test kits to check for hidden blood in your stool.  A small camera at the end of a tube can be used to examine your colon directly (sigmoidoscopy or colonoscopy). This is done to check for the earliest forms of colorectal cancer.  Direct examination of the colon should be repeated every 5-10 years until age 31. However, if early forms of precancerous polyps or small growths are found or if you have a family history or genetic risk for colorectal cancer, you may need to be screened more often.  Skin Cancer  Check your skin from head to toe regularly.  Monitor any moles. Be sure to tell your health care provider: ? About any new moles or changes in moles, especially if there is a change in a mole's shape or color. ? If you have a mole that is larger than the size of a pencil eraser.  If any of your family members has a history of skin cancer, especially at a young age, talk with your health care provider about genetic screening.  Always use sunscreen. Apply sunscreen liberally and repeatedly throughout the day.  Whenever you are outside, protect yourself by wearing long sleeves, pants, a wide-brimmed hat, and sunglasses.  What should I know about osteoporosis? Osteoporosis is a condition in which bone destruction happens more quickly than new bone creation. After menopause, you may be at an increased  risk for osteoporosis. To help prevent osteoporosis or the bone fractures that can happen because of osteoporosis, the following is recommended:  If you are 37-6 years old, get at least 1,000 mg of calcium and at least 600 mg of vitamin D per day.  If you are older than age 13 but younger than age 41, get at least 1,200 mg of calcium and at least 600 mg of vitamin D per day.  If you are older than age 46, get at least 1,200 mg of calcium and at least 800 mg of vitamin D per day.  Smoking and excessive alcohol intake increase the risk of osteoporosis. Eat foods that are rich in calcium and vitamin D, and do weight-bearing exercises several times each week as directed by your health care provider. What should  I know about how menopause affects my mental health? Depression may occur at any age, but it is more common as you become older. Common symptoms of depression include:  Low or sad mood.  Changes in sleep patterns.  Changes in appetite or eating patterns.  Feeling an overall lack of motivation or enjoyment of activities that you previously enjoyed.  Frequent crying spells.  Talk with your health care provider if you think that you are experiencing depression. What should I know about immunizations? It is important that you get and maintain your immunizations. These include:  Tetanus, diphtheria, and pertussis (Tdap) booster vaccine.  Influenza every year before the flu season begins.  Pneumonia vaccine.  Shingles vaccine.  Your health care provider may also recommend other immunizations. This information is not intended to replace advice given to you by your health care provider. Make sure you discuss any questions you have with your health care provider. Document Released: 10/03/2005 Document Revised: 02/29/2016 Document Reviewed: 05/15/2015 Elsevier Interactive Patient Education  2018 [employer].

## 2017-11-05 NOTE — Assessment & Plan Note (Signed)
Tolerating statin, encouraged heart healthy diet, avoid trans fats, minimize simple carbs and saturated fats. Increase exercise as tolerated 

## 2017-11-05 NOTE — Progress Notes (Signed)
Patient ID: [name], female    DOB: [birth year]  Age: 84 y.o. MRN: [id number]    Subjective:  Subjective  HPI [name] presents for f/u and ekg.  Cardiology told pt as long as we checked her ekg annually she would not need to go back to them unless there was a problem.  Pt is here with her daughter --- no complaints.   Review of Systems  Constitutional: Negative for activity change, appetite change, diaphoresis, fatigue and unexpected weight change.  Eyes: Negative for pain, redness and visual disturbance.  Respiratory: Negative for cough, chest tightness, shortness of breath and wheezing.   Cardiovascular: Negative for chest pain, palpitations and leg swelling.  Endocrine: Negative for cold intolerance, heat intolerance, polydipsia, polyphagia and polyuria.  Genitourinary: Negative for difficulty urinating, dysuria and frequency.  Neurological: Negative for dizziness, light-headedness, numbness and headaches.  Psychiatric/Behavioral: Negative for behavioral problems and dysphoric mood. The patient is not nervous/anxious.     History Past Medical History:  Diagnosis Date  . Allergy   . Anemia   . Anxiety   . Cataract 05/2011  . Chronic low blood pressure    due to dehydration  . Depression   . Esophageal disorder   . Esophageal yeast infection ([place])   . GERD (gastroesophageal reflux disease)   . Hearing loss    hearing aids  . Hyperlipidemia   . Hypothyroidism   . Myocardial infarction ([place])   . Osteoporosis   . Renal cell carcinoma ([place])    found on CT  . Thyroid disease    hypothyroidism  . Transfusion history    last 2004    She has a past surgical history that includes obstructed ureter; Abdominal hysterectomy; Total abdominal hysterectomy w/ bilateral salpingoophorectomy; Kyphosis surgery; Excision basal cell carcinoma; Eye surgery (05/2011); Tonsillectomy; Appendectomy; Colonoscopy; Coronary artery bypass graft; Renal cryoablation (Right, 09/2014); ir  generic historical (08/29/2014); ir generic historical (10/22/2016); and IR Radiologist Eval & Mgmt (11/03/2017).   Her family history includes Cancer in her other; Heart disease in her other; Hyperlipidemia in her other; Hypertension in her other; Parkinsonism in her father.She reports that she has quit smoking. she has never used smokeless tobacco. She reports that she does not drink alcohol or use drugs.  Current Outpatient Medications on File Prior to Visit  Medication Sig Dispense Refill  . aspirin 81 MG EC tablet Take 81 mg by mouth daily.      . Cholecalciferol (VITAMIN D3) 1000 UNITS CAPS Take 1,000 Units by mouth daily.     . ferrous fumarate (FERRETTS) 325 (106 FE) MG TABS Take 1 tablet by mouth daily.     . fluticasone (FLONASE) 50 MCG/ACT nasal spray Place 2 sprays into both nostrils daily as needed for allergies or rhinitis.    . folic acid (FOLVITE) 1 MG tablet Take 1 mg by mouth daily.      . furosemide (LASIX) 40 MG tablet TAKE 1 TABLET BY MOUTH  DAILY AS NEEDED 90 tablet 1  . levothyroxine (SYNTHROID, LEVOTHROID) 25 MCG tablet TAKE 1 TABLET BY MOUTH  DAILY BEFORE BREAKFAST 90 tablet 1  . loratadine-pseudoephedrine (CLARITIN-D 24-HOUR) 10-240 MG per 24 hr tablet Take 1 tablet by mouth daily as needed for allergies.    . Multiple Vitamin (MULTIVITAMIN WITH MINERALS) TABS tablet Take 1 tablet by mouth daily.    Marland Kitchen nystatin (MYCOSTATIN) 100000 UNIT/ML suspension Take 10 mLs by mouth as directed. Patient uses 10 ml once weekly to soak dentures    .  potassium chloride SA (K-DUR,KLOR-CON) 20 MEQ tablet TAKE 1 TABLET BY MOUTH TWO  TIMES DAILY 180 tablet 1  . promethazine (PHENERGAN) 25 MG tablet TAKE 1 TABLET BY MOUTH TWO  TIMES DAILY 180 tablet 0  . promethazine (PHENERGAN) 25 MG tablet TAKE 1 TABLET BY MOUTH TWO  TIMES DAILY 180 tablet 0  . ranitidine (ZANTAC) 150 MG tablet Take 150 mg by mouth 2 (two) times daily.    Marland Kitchen rOPINIRole (REQUIP) 1 MG tablet TAKE 1 TABLET BY MOUTH AT  BEDTIME 90  tablet 1  . simvastatin (ZOCOR) 20 MG tablet TAKE 1 TABLET BY MOUTH  DAILY 90 tablet 1  . tiZANidine (ZANAFLEX) 2 MG tablet Take 2 mg by mouth every 3 (three) hours as needed for muscle spasms.      No current facility-administered medications on file prior to visit.      Objective:  Objective  Physical Exam  Constitutional: She is oriented to person, place, and time. She appears well-developed and well-nourished.  HENT:  Head: Normocephalic and atraumatic.  Right Ear: External ear normal.  Left Ear: External ear normal.  Nose: Mucosal edema, rhinorrhea and sinus tenderness present. No nasal deformity. Right sinus exhibits maxillary sinus tenderness and frontal sinus tenderness. Left sinus exhibits maxillary sinus tenderness and frontal sinus tenderness.  Mouth/Throat: Oropharynx is clear and moist and mucous membranes are normal. No oropharyngeal exudate.  Eyes: Conjunctivae and EOM are normal. Right eye exhibits no discharge. Left eye exhibits no discharge.  Neck: Normal range of motion. Neck supple. No JVD present. Carotid bruit is not present. No thyromegaly present.  Cardiovascular: Normal rate, regular rhythm and normal heart sounds.  No murmur heard. Pulmonary/Chest: Effort normal and breath sounds normal. No respiratory distress. She has no wheezes. She has no rales. She exhibits no tenderness. Right breast exhibits no inverted nipple, no mass, no nipple discharge, no skin change and no tenderness. Left breast exhibits no inverted nipple, no mass, no nipple discharge, no skin change and no tenderness. Breasts are symmetrical.  Abdominal: Soft. She exhibits no distension and no mass. There is no tenderness. There is no rebound and no guarding.  Musculoskeletal: She exhibits no edema.  Lymphadenopathy:    She has no cervical adenopathy.  Neurological: She is alert and oriented to person, place, and time.  Skin: Skin is warm. She is not diaphoretic.  Psychiatric: She has a normal  mood and affect.  Nursing note and vitals reviewed.  BP 102/60 (BP Location: Left Arm, Patient Position: Sitting, Cuff Size: Small)   Pulse 88   Temp 97.8 F (36.6 C) (Oral)   Resp 16   Ht 5\' 3"  (1.6 m)   Wt 82 lb 3.2 oz (37.3 kg)   SpO2 98%   BMI 14.56 kg/m  Wt Readings from Last 3 Encounters:  11/05/17 82 lb (37.2 kg)  11/05/17 82 lb 3.2 oz (37.3 kg)  11/03/17 95 lb (43.1 kg)     Lab Results  Component Value Date   WBC 7.4 10/23/2016   HGB 13.3 10/23/2016   HCT 40.2 10/23/2016   PLT 307.0 10/23/2016   GLUCOSE 104 (H) 05/15/2017   CHOL 182 05/15/2017   TRIG 66.0 05/15/2017   HDL 68.90 05/15/2017   LDLDIRECT 165.1 05/10/2008   LDLCALC 100 (H) 05/15/2017   ALT 18 05/15/2017   AST 23 05/15/2017   NA 139 05/15/2017   K 4.4 05/15/2017   CL 105 05/15/2017   CREATININE 1.40 (H) 11/03/2017   BUN 10 05/15/2017  CO2 26 05/15/2017   TSH 1.81 07/22/2017   INR 0.89 09/29/2014   HGBA1C 5.8 10/27/2013    Ct Abdomen W Wo Contrast  Result Date: 11/03/2017 CLINICAL DATA:  Status post right renal cryoablation. EXAM: CT ABDOMEN WITHOUT AND WITH CONTRAST TECHNIQUE: Multidetector CT imaging of the abdomen was performed following the standard protocol before and following the bolus administration of intravenous contrast. CONTRAST:  46mL ISOVUE-300 IOPAMIDOL (ISOVUE-300) INJECTION 61% COMPARISON:  10/22/2016 FINDINGS: Lower chest: Calcified granuloma identified in the right lower lobe man Hepatobiliary: No focal liver abnormality. 5 mm gallstone identified. No biliary dilatation. Pancreas: Two simple appearing unilocular cysts without internal enhancement identified within the head of pancreas. The largest demonstrates rapid growth from the previous exam currently measuring 1.6 cm, image 28/12. On 10/22/16 this measured 4 mm. The smaller lesion measures 1 cm and is unchanged from the previous exam. No pancreatic ductal dilatation. Spleen: Normal appearance of the spleen. Adrenals/Urinary  Tract: The adrenal glands are normal. The cryoablation zone within the interpolar right kidney is again noted, image 49/series 6. Small area of hyperdensity within the medial aspect of the cryoablation zone is again noted measuring 1.1 cm. This measures 40 HU precontrast and 70 HU on the arterial phase images, similar in size to previous exam. Stable adjacent cyst measuring 1.2 cm. Unchanged intermediate attenuating cyst within the posterior cortex of the left kidney measuring 1 cm. Small hypodensity within the upper pole of the left kidney measures 5 mm and is too small to characterize. Two stones identified within the left renal collecting system. The largest is in the interpolar region measuring 8 mm, image 25/2. Stomach/Bowel: Stomach is within normal limits. No evidence of bowel wall thickening, distention, or inflammatory changes. Vascular/Lymphatic: Aortic atherosclerosis. No aneurysm. Increase caliber of the portal vein is identified which measures up to 1.6 cm. No upper abdominal adenopathy. Other: No free fluid or fluid collections identified within the abdomen. Musculoskeletal: No suspicious bone lesions. Hit treated compression deformity at L1. Stable from previous exam. IMPRESSION: 1. Stable appearance of cryo ablation zone within the right kidney. The small residual, possibly enhancing ovoid hyperdense focus within the ablation zone is unchanged. 2. No evidence for metastatic disease within the abdomen. 3. Two cystic lesions are noted within the head of pancreas. One of these demonstrates rapid growth from 10/22/2016 currently measuring 1.9 cm, 4 mm previously. According to consensus criteria given the interval growth from 10/22/2016 further investigation with endoscopic ultrasound and/or FNA is advised. This recommendation follows ACR consensus guidelines: Management of Incidental Pancreatic Cysts: A White Paper of the ACR Incidental Findings Committee. [place] [id number]. 4. Gallstone  5. Left kidney stones 6.  Aortic Atherosclerosis (ICD10-I70.0). Electronically Signed   By: Kerby Moors M.D.   On: 11/03/2017 08:50   Ir Radiologist Eval & Mgmt  Result Date: 11/03/2017 Please refer to notes tab for details about interventional procedure. (Op Note)    Assessment & Plan:  Plan  I have changed Judee Clara. Anstead's metoCLOPramide, fluconazole, and promethazine. I am also having her maintain her aspirin, folic acid, Vitamin D3, ferrous fumarate, ranitidine, loratadine-pseudoephedrine, multivitamin with minerals, tiZANidine, fluticasone, nystatin, promethazine, potassium chloride SA, furosemide, rOPINIRole, simvastatin, levothyroxine, promethazine, traMADol, and ALPRAZolam.  Meds ordered this encounter  Medications  . metoCLOPramide (REGLAN) 10 MG tablet    Sig: Take 1 tablet (10 mg total) by mouth at bedtime.    Dispense:  90 tablet    Refill:  1  . fluconazole (DIFLUCAN) 100 MG  tablet    Sig: Take 1 tablet (100 mg total) by mouth daily.    Dispense:  90 tablet    Refill:  1  . promethazine (PHENERGAN) 25 MG tablet    Sig: Take 1 tablet (25 mg total) by mouth 2 (two) times daily.    Dispense:  180 tablet    Refill:  1  . traMADol (ULTRAM) 50 MG tablet    Sig: Take 1 tablet (50 mg total) by mouth every 6 (six) hours as needed (pain).    Dispense:  270 tablet    Refill:  1  . ALPRAZolam (XANAX) 0.5 MG tablet    Sig: TAKE 1 TABLET BY MOUTH  TWICE A DAY AS NEEDED FOR  ANXIETY    Dispense:  180 tablet    Refill:  0    Problem List Items Addressed This Visit      Unprioritized   Atherosclerosis of native coronary artery of native heart without angina pectoris - Primary   Relevant Orders   EKG 12-Lead (Completed)   CBC with Differential/Platelet   Comprehensive metabolic panel   Lipid panel   Hyperlipidemia    Tolerating statin, encouraged heart healthy diet, avoid trans fats, minimize simple carbs and saturated fats. Increase exercise as tolerated       Hypothyroidism   Relevant Orders   TSH   MYOCARDIAL INFARCTION, HX OF    Stable -- no complaints ekg-- no acute changes      Osteoarthritis of multiple joints   Relevant Medications   traMADol (ULTRAM) 50 MG tablet   Other Relevant Orders   Pain Mgmt, Profile 8 w/Conf, U    Other Visit Diagnoses    High risk medication use       Relevant Orders   Pain Mgmt, Profile 8 w/Conf, U   Nausea       Relevant Medications   metoCLOPramide (REGLAN) 10 MG tablet   promethazine (PHENERGAN) 25 MG tablet   Anxiety       Relevant Medications   ALPRAZolam (XANAX) 0.5 MG tablet      Follow-up: Return in about 1 year (around 11/06/2018), or if symptoms worsen or fail to improve, for annual exam, fasting.  Ann Held, DO

## 2017-11-10 LAB — PAIN MGMT, PROFILE 8 W/CONF, U
6 ACETYLMORPHINE: NEGATIVE ng/mL (ref <10)
ALPHAHYDROXYMIDAZOLAM: NEGATIVE ng/mL (ref <50)
ALPHAHYDROXYTRIAZOLAM: NEGATIVE ng/mL (ref <50)
Alcohol Metabolites: NEGATIVE ng/mL (ref <500)
Alphahydroxyalprazolam: 51 ng/mL — ABNORMAL HIGH (ref <25)
Aminoclonazepam: NEGATIVE ng/mL (ref <25)
Amphetamines: NEGATIVE ng/mL (ref <500)
BENZODIAZEPINES: POSITIVE ng/mL — AB (ref <100)
Buprenorphine, Urine: NEGATIVE ng/mL (ref <5)
Cocaine Metabolite: NEGATIVE ng/mL (ref <150)
Creatinine: 63.4 mg/dL
HYDROXYETHYLFLURAZEPAM: NEGATIVE ng/mL (ref <50)
Lorazepam: NEGATIVE ng/mL (ref <50)
MARIJUANA METABOLITE: NEGATIVE ng/mL (ref <20)
MDMA: NEGATIVE ng/mL (ref <500)
Nordiazepam: NEGATIVE ng/mL (ref <50)
OXAZEPAM: NEGATIVE ng/mL (ref <50)
OXYCODONE: NEGATIVE ng/mL (ref <100)
Opiates: NEGATIVE ng/mL (ref <100)
Oxidant: NEGATIVE ug/mL (ref <200)
Temazepam: NEGATIVE ng/mL (ref <50)
pH: 7.07 (ref 4.5–9.0)

## 2017-11-15 ENCOUNTER — Encounter: Payer: Self-pay | Admitting: Family Medicine

## 2017-12-08 ENCOUNTER — Other Ambulatory Visit: Payer: Self-pay | Admitting: Family Medicine

## 2017-12-08 DIAGNOSIS — M159 Polyosteoarthritis, unspecified: Secondary | ICD-10-CM

## 2017-12-09 NOTE — Telephone Encounter (Signed)
[facility] mail service  Database ran and is on your desk for review.  Last filled per database: 10/05/17 for 120 Last written: 11/05/17 but I do not think patient got rx, it was printed and not filled Last ov: 11/05/17 Next ov:11/08/18 Contract: 11/06/18 UDS: 05/08/18

## 2018-01-20 ENCOUNTER — Other Ambulatory Visit: Payer: Self-pay

## 2018-01-20 ENCOUNTER — Ambulatory Visit (INDEPENDENT_AMBULATORY_CARE_PROVIDER_SITE_OTHER): Payer: MEDICARE | Admitting: Gastroenterology

## 2018-01-20 ENCOUNTER — Encounter: Payer: Self-pay | Admitting: Gastroenterology

## 2018-01-20 ENCOUNTER — Telehealth: Payer: Self-pay

## 2018-01-20 VITALS — BP 92/64 | HR 88 | Ht 63.0 in | Wt 91.8 lb

## 2018-01-20 DIAGNOSIS — K8689 Other specified diseases of pancreas: Secondary | ICD-10-CM

## 2018-01-20 DIAGNOSIS — K869 Disease of pancreas, unspecified: Secondary | ICD-10-CM | POA: Diagnosis not present

## 2018-01-20 NOTE — Telephone Encounter (Signed)
Upper EUS, radial +/- linear with MAC sedation for new pancreatic lesion (cyst).

## 2018-01-20 NOTE — H&P (View-Only) (Signed)
Review of pertinent gastrointestinal problems:  1. Adenomatous polyp: Colonoscopy January 2012 (age 84) done for fecal occult positive stool, 14 mm adenoma was removed in piecemeal fashion. I felt was completely resected and did not require any further surveillance given her age.  2. GERD: Had allergic (welts, rash) reaction to prilosec ; EGD 07/2012 Esophageal yeast infection Mild to moderate pan-gastritis, biopsied to check for H. pylori The examination was otherwise normal; biopsies showed chronic gastritis, no H. Pylori. May, 2014 doing well on twice daily H2 blocker, bedtime Reglan. 02/2013 called with recurrent dysphagia, diflucan restarted empirically.  12/2013 intermittent diflucan treatment helps her swallowing.  She started soaking her dentures in nystatin every 7-10 days.    HPI: Very pleasant 84 year old woman who was referred by Dr. Kathlene Cote from interventional radiology for new pancreatic cyst  Chief complaint is abnormal pancreas   I last saw her about 4 years ago, follow-up for her dysphasia.  Her swallowing was better but not yet completely normal.  She had lost about 3 pounds since her prior visit and was complaining of some left-sided abdominal pains.  I have arranged for an abdominal CT scan.  This showed a right kidney mass, worrisome for renal cell cancer.  Also a stone in the midpole of her left kidney.  Those results were forwarded to her urologist.  She eventually changed urologic care to here in [place] and underwent ablation of presumed renal cell cancer right kidney.  She has interval imaging to follow the presumed renal cell cancer and her most recent CT scan shows a new lesion in the head of her pancreas.  She has never had problems with her pancreas that she is aware of before this.  Pancreatic cancer does not run in her family.  She has not been losing weight recently.  CT scan 10/2017:  Pancreas: Two simple appearing unilocular cysts without internal enhancement  identified within the head of pancreas. The largest demonstrates rapid growth from the previous exam currently measuring 1.6 cm, image 28/12. On 10/22/16 this measured 4 mm. The smaller lesion measures 1 cm and is unchanged from the previous exam. No pancreatic ductal dilatation.   Up 6 poiunds in 4 years.  Same left lower abd pains.  Intermittent attacks, unclear if related to her bowels.  Phenergan helps the nausea that comes with the pains.  She still has some intermittent dysphasia especially with big pills.  Review of systems: Pertinent positive and negative review of systems were noted in the above HPI section. All other review negative.   Past Medical History:  Diagnosis Date  . Allergy   . Anemia   . Anxiety   . Cataract 05/2011  . Chronic low blood pressure    due to dehydration  . Depression   . Esophageal disorder   . Esophageal yeast infection ([place])   . GERD (gastroesophageal reflux disease)   . Hearing loss    hearing aids  . Hyperlipidemia   . Hypothyroidism   . Myocardial infarction ([place])   . Osteoporosis   . Renal cell carcinoma ([place])    found on CT  . Thyroid disease    hypothyroidism  . Transfusion history    last 2004    Past Surgical History:  Procedure Laterality Date  . APPENDECTOMY    . BLEPHAROPLASTY Right    x 3 (all related to basal cell carcinoma)  . COLONOSCOPY    . CORONARY ARTERY BYPASS GRAFT     x2 vessel bypass -Dr. Burt Knack -  [facility] L[facility] 06-22-14  . IR GENERIC HISTORICAL  08/29/2014   IR RADIOLOGIST EVAL & MGMT 08/29/2014 Aletta Edouard, MD [facility] INTERV RAD  . IR GENERIC HISTORICAL  10/22/2016   IR RADIOLOGIST EVAL & MGMT 10/22/2016 Aletta Edouard, MD [facility] INTERV RAD  . IR RADIOLOGIST EVAL & MGMT  11/03/2017  . KYPHOSIS SURGERY    . obstructed ureter Left    repair-operative  . RENAL CRYOABLATION Right 09/2014  . TONSILLECTOMY    . TOTAL ABDOMINAL HYSTERECTOMY W/ BILATERAL SALPINGOOPHORECTOMY      Current Outpatient Medications   Medication Sig Dispense Refill  . ALPRAZolam (XANAX) 0.5 MG tablet TAKE 1 TABLET BY MOUTH  TWICE A DAY AS NEEDED FOR  ANXIETY 180 tablet 0  . aspirin 81 MG EC tablet Take 81 mg by mouth daily.      . Cholecalciferol (VITAMIN D3) 1000 UNITS CAPS Take 1,000 Units by mouth daily.     . ferrous fumarate (FERRETTS) 325 (106 FE) MG TABS Take 1 tablet by mouth daily.     . fluconazole (DIFLUCAN) 100 MG tablet Take 1 tablet (100 mg total) by mouth daily. 90 tablet 1  . fluticasone (FLONASE) 50 MCG/ACT nasal spray Place 2 sprays into both nostrils daily as needed for allergies or rhinitis.    . folic acid (FOLVITE) 1 MG tablet Take 1 mg by mouth daily.      . furosemide (LASIX) 40 MG tablet TAKE 1 TABLET BY MOUTH  DAILY AS NEEDED 90 tablet 1  . levothyroxine (SYNTHROID, LEVOTHROID) 25 MCG tablet TAKE 1 TABLET BY MOUTH  DAILY BEFORE BREAKFAST 90 tablet 1  . loratadine-pseudoephedrine (CLARITIN-D 24-HOUR) 10-240 MG per 24 hr tablet Take 1 tablet by mouth daily as needed for allergies.    Marland Kitchen metoCLOPramide (REGLAN) 10 MG tablet Take 1 tablet (10 mg total) by mouth at bedtime. 90 tablet 1  . Multiple Vitamin (MULTIVITAMIN WITH MINERALS) TABS tablet Take 1 tablet by mouth daily.    Marland Kitchen nystatin (MYCOSTATIN) 100000 UNIT/ML suspension Take 10 mLs by mouth as directed. Patient uses 10 ml once weekly to soak dentures    . potassium chloride SA (K-DUR,KLOR-CON) 20 MEQ tablet TAKE 1 TABLET BY MOUTH TWO  TIMES DAILY 180 tablet 1  . promethazine (PHENERGAN) 25 MG tablet TAKE 1 TABLET BY MOUTH TWO  TIMES DAILY 180 tablet 0  . ranitidine (ZANTAC) 150 MG tablet Take 150 mg by mouth 2 (two) times daily.    Marland Kitchen rOPINIRole (REQUIP) 1 MG tablet TAKE 1 TABLET BY MOUTH AT  BEDTIME 90 tablet 1  . simvastatin (ZOCOR) 20 MG tablet TAKE 1 TABLET BY MOUTH  DAILY 90 tablet 1  . tiZANidine (ZANAFLEX) 2 MG tablet Take 2 mg by mouth every 3 (three) hours as needed for muscle spasms.     . traMADol (ULTRAM) 50 MG tablet TAKE 1 TABLET BY  MOUTH  EVERY 6 HOURS AS NEEDED FOR PAIN 120 tablet 0   No current facility-administered medications for this visit.     Allergies as of 01/20/2018 - Review Complete 01/20/2018  Allergen Reaction Noted  . Zofran [ondansetron hcl] Anaphylaxis and Other (See Comments) 07/23/2012  . Clarithromycin    . Codeine    . Diazepam Other (See Comments) 10/11/2012  . Ondansetron Swelling 10/11/2012  . Sulfonamide derivatives    . Prilosec [omeprazole magnesium] Rash 05/26/2011  . Trazodone Anxiety 10/11/2012    Family History  Problem Relation Age of Onset  . Parkinsonism Father   . Hypertension Other   .  Heart disease Other        CAD.Marland Kitchen1ST DEGREE RELATIVE <60  . Hyperlipidemia Other   . Breast cancer Sister   . Breast cancer Other   . Colon cancer Neg Hx   . Esophageal cancer Neg Hx   . Rectal cancer Neg Hx   . Stomach cancer Neg Hx     Social History   Socioeconomic History  . Marital status: Single    Spouse name: Not on file  . Number of children: 2  . Years of education: Not on file  . Highest education level: Not on file  Occupational History  . Occupation: Retired    Fish farm manager: RETIRED  Social Needs  . Financial resource strain: Not on file  . Food insecurity:    Worry: Not on file    Inability: Not on file  . Transportation needs:    Medical: Not on file    Non-medical: Not on file  Tobacco Use  . Smoking status: Former Research scientist (life sciences)  . Smokeless tobacco: Never Used  Substance and Sexual Activity  . Alcohol use: No  . Drug use: No  . Sexual activity: Not Currently  Lifestyle  . Physical activity:    Days per week: Not on file    Minutes per session: Not on file  . Stress: Not on file  Relationships  . Social connections:    Talks on phone: Not on file    Gets together: Not on file    Attends religious service: Not on file    Active member of club or organization: Not on file    Attends meetings of clubs or organizations: Not on file    Relationship status: Not  on file  . Intimate partner violence:    Fear of current or ex partner: Not on file    Emotionally abused: Not on file    Physically abused: Not on file    Forced sexual activity: Not on file  Other Topics Concern  . Not on file  Social History Narrative   REG EXERCISE   [place]   LIVES ALONE   END OF LIFE:PATIENT HAS LIVING WILL AND CLEARLY STATES SHE DOES NOT WANT CARDIAC [place] VENTILATION OR OTHER HEROIC OR FUTILE MEASURES.     Physical Exam: BP 92/64   Pulse 88   Ht 5\' 3"  (1.6 m)   Wt 91 lb 12.8 oz (41.6 kg)   BMI 16.26 kg/m  Constitutional: Elderly, very frail woman Psychiatric: alert and oriented x3 Eyes: extraocular movements intact Mouth: oral pharynx moist, no lesions Neck: supple no lymphadenopathy Cardiovascular: heart regular rate and rhythm Lungs: clear to auscultation bilaterally Abdomen: soft, nontender, nondistended, no obvious ascites, no peritoneal signs, normal bowel sounds Extremities: no lower extremity edema bilaterally Skin: no lesions on visible extremities   Assessment and plan: 84 y.o. female with new lesion in head of pancreas   The lesion was noted incidentally on serial imaging for follow-up for presumed renal cell cancer.  We discussed the pros and cons of further testing such as biopsy procedure versus serial imaging surveillance.  She clearly wants to go ahead with biopsy to get more information on this cyst, lesion.  I think it is unlikely that it harbors cancer currently but may be precancerous.  Renal cell can also metastasize really anywhere in the body so perhaps it is related to that.  We discussed endoscopic ultrasound as relatively low risk upper endoscopic procedure and she wants to go ahead with that.  We will  schedule at her soonest convenience   Please see the "Patient Instructions" section for addition details about the plan.   Owens Loffler, MD [facility] 01/20/2018, 8:58 AM  Cc: Dr. Kathlene Cote

## 2018-01-20 NOTE — Telephone Encounter (Signed)
EUS scheduled, pt instructed and medications reviewed.  Patient instructions mailed to home.  Patient to call with any questions or concerns.  

## 2018-01-20 NOTE — Progress Notes (Signed)
Review of pertinent gastrointestinal problems:  1. Adenomatous polyp: Colonoscopy January 2012 (age 84) done for fecal occult positive stool, 14 mm adenoma was removed in piecemeal fashion. I felt was completely resected and did not require any further surveillance given her age.  2. GERD: Had allergic (welts, rash) reaction to prilosec ; EGD 07/2012 Esophageal yeast infection Mild to moderate pan-gastritis, biopsied to check for H. pylori The examination was otherwise normal; biopsies showed chronic gastritis, no H. Pylori. May, 2014 doing well on twice daily H2 blocker, bedtime Reglan. 02/2013 called with recurrent dysphagia, diflucan restarted empirically.  12/2013 intermittent diflucan treatment helps her swallowing.  She started soaking her dentures in nystatin every 7-10 days.    HPI: Very pleasant 83 year old woman who was referred by Dr. Kathlene Cote from interventional radiology for new pancreatic cyst  Chief complaint is abnormal pancreas   I last saw her about 4 years ago, follow-up for her dysphasia.  Her swallowing was better but not yet completely normal.  She had lost about 3 pounds since her prior visit and was complaining of some left-sided abdominal pains.  I have arranged for an abdominal CT scan.  This showed a right kidney mass, worrisome for renal cell cancer.  Also a stone in the midpole of her left kidney.  Those results were forwarded to her urologist.  She eventually changed urologic care to here in [place] and underwent ablation of presumed renal cell cancer right kidney.  She has interval imaging to follow the presumed renal cell cancer and her most recent CT scan shows a new lesion in the head of her pancreas.  She has never had problems with her pancreas that she is aware of before this.  Pancreatic cancer does not run in her family.  She has not been losing weight recently.  CT scan 10/2017:  Pancreas: Two simple appearing unilocular cysts without internal enhancement  identified within the head of pancreas. The largest demonstrates rapid growth from the previous exam currently measuring 1.6 cm, image 28/12. On 10/22/16 this measured 4 mm. The smaller lesion measures 1 cm and is unchanged from the previous exam. No pancreatic ductal dilatation.   Up 6 poiunds in 4 years.  Same left lower abd pains.  Intermittent attacks, unclear if related to her bowels.  Phenergan helps the nausea that comes with the pains.  She still has some intermittent dysphasia especially with big pills.  Review of systems: Pertinent positive and negative review of systems were noted in the above HPI section. All other review negative.   Past Medical History:  Diagnosis Date  . Allergy   . Anemia   . Anxiety   . Cataract 05/2011  . Chronic low blood pressure    due to dehydration  . Depression   . Esophageal disorder   . Esophageal yeast infection ([place])   . GERD (gastroesophageal reflux disease)   . Hearing loss    hearing aids  . Hyperlipidemia   . Hypothyroidism   . Myocardial infarction ([place])   . Osteoporosis   . Renal cell carcinoma ([place])    found on CT  . Thyroid disease    hypothyroidism  . Transfusion history    last 2004    Past Surgical History:  Procedure Laterality Date  . APPENDECTOMY    . BLEPHAROPLASTY Right    x 3 (all related to basal cell carcinoma)  . COLONOSCOPY    . CORONARY ARTERY BYPASS GRAFT     x2 vessel bypass -Dr. Burt Knack -  [place] [facility] 06-22-14  . IR GENERIC HISTORICAL  08/29/2014   IR RADIOLOGIST EVAL & MGMT 08/29/2014 Aletta Edouard, MD [facility] INTERV RAD  . IR GENERIC HISTORICAL  10/22/2016   IR RADIOLOGIST EVAL & MGMT 10/22/2016 Aletta Edouard, MD [facility] INTERV RAD  . IR RADIOLOGIST EVAL & MGMT  11/03/2017  . KYPHOSIS SURGERY    . obstructed ureter Left    repair-operative  . RENAL CRYOABLATION Right 09/2014  . TONSILLECTOMY    . TOTAL ABDOMINAL HYSTERECTOMY W/ BILATERAL SALPINGOOPHORECTOMY      Current Outpatient Medications   Medication Sig Dispense Refill  . ALPRAZolam (XANAX) 0.5 MG tablet TAKE 1 TABLET BY MOUTH  TWICE A DAY AS NEEDED FOR  ANXIETY 180 tablet 0  . aspirin 81 MG EC tablet Take 81 mg by mouth daily.      . Cholecalciferol (VITAMIN D3) 1000 UNITS CAPS Take 1,000 Units by mouth daily.     . ferrous fumarate (FERRETTS) 325 (106 FE) MG TABS Take 1 tablet by mouth daily.     . fluconazole (DIFLUCAN) 100 MG tablet Take 1 tablet (100 mg total) by mouth daily. 90 tablet 1  . fluticasone (FLONASE) 50 MCG/ACT nasal spray Place 2 sprays into both nostrils daily as needed for allergies or rhinitis.    . folic acid (FOLVITE) 1 MG tablet Take 1 mg by mouth daily.      . furosemide (LASIX) 40 MG tablet TAKE 1 TABLET BY MOUTH  DAILY AS NEEDED 90 tablet 1  . levothyroxine (SYNTHROID, LEVOTHROID) 25 MCG tablet TAKE 1 TABLET BY MOUTH  DAILY BEFORE BREAKFAST 90 tablet 1  . loratadine-pseudoephedrine (CLARITIN-D 24-HOUR) 10-240 MG per 24 hr tablet Take 1 tablet by mouth daily as needed for allergies.    Marland Kitchen metoCLOPramide (REGLAN) 10 MG tablet Take 1 tablet (10 mg total) by mouth at bedtime. 90 tablet 1  . Multiple Vitamin (MULTIVITAMIN WITH MINERALS) TABS tablet Take 1 tablet by mouth daily.    Marland Kitchen nystatin (MYCOSTATIN) 100000 UNIT/ML suspension Take 10 mLs by mouth as directed. Patient uses 10 ml once weekly to soak dentures    . potassium chloride SA (K-DUR,KLOR-CON) 20 MEQ tablet TAKE 1 TABLET BY MOUTH TWO  TIMES DAILY 180 tablet 1  . promethazine (PHENERGAN) 25 MG tablet TAKE 1 TABLET BY MOUTH TWO  TIMES DAILY 180 tablet 0  . ranitidine (ZANTAC) 150 MG tablet Take 150 mg by mouth 2 (two) times daily.    Marland Kitchen rOPINIRole (REQUIP) 1 MG tablet TAKE 1 TABLET BY MOUTH AT  BEDTIME 90 tablet 1  . simvastatin (ZOCOR) 20 MG tablet TAKE 1 TABLET BY MOUTH  DAILY 90 tablet 1  . tiZANidine (ZANAFLEX) 2 MG tablet Take 2 mg by mouth every 3 (three) hours as needed for muscle spasms.     . traMADol (ULTRAM) 50 MG tablet TAKE 1 TABLET BY  MOUTH  EVERY 6 HOURS AS NEEDED FOR PAIN 120 tablet 0   No current facility-administered medications for this visit.     Allergies as of 01/20/2018 - Review Complete 01/20/2018  Allergen Reaction Noted  . Zofran [ondansetron hcl] Anaphylaxis and Other (See Comments) 07/23/2012  . Clarithromycin    . Codeine    . Diazepam Other (See Comments) 10/11/2012  . Ondansetron Swelling 10/11/2012  . Sulfonamide derivatives    . Prilosec [omeprazole magnesium] Rash 05/26/2011  . Trazodone Anxiety 10/11/2012    Family History  Problem Relation Age of Onset  . Parkinsonism Father   . Hypertension Other   .  Heart disease Other        CAD.Marland Kitchen1ST DEGREE RELATIVE <60  . Hyperlipidemia Other   . Breast cancer Sister   . Breast cancer Other   . Colon cancer Neg Hx   . Esophageal cancer Neg Hx   . Rectal cancer Neg Hx   . Stomach cancer Neg Hx     Social History   Socioeconomic History  . Marital status: Single    Spouse name: Not on file  . Number of children: 2  . Years of education: Not on file  . Highest education level: Not on file  Occupational History  . Occupation: Retired    Fish farm manager: RETIRED  Social Needs  . Financial resource strain: Not on file  . Food insecurity:    Worry: Not on file    Inability: Not on file  . Transportation needs:    Medical: Not on file    Non-medical: Not on file  Tobacco Use  . Smoking status: Former Research scientist (life sciences)  . Smokeless tobacco: Never Used  Substance and Sexual Activity  . Alcohol use: No  . Drug use: No  . Sexual activity: Not Currently  Lifestyle  . Physical activity:    Days per week: Not on file    Minutes per session: Not on file  . Stress: Not on file  Relationships  . Social connections:    Talks on phone: Not on file    Gets together: Not on file    Attends religious service: Not on file    Active member of club or organization: Not on file    Attends meetings of clubs or organizations: Not on file    Relationship status: Not  on file  . Intimate partner violence:    Fear of current or ex partner: Not on file    Emotionally abused: Not on file    Physically abused: Not on file    Forced sexual activity: Not on file  Other Topics Concern  . Not on file  Social History Narrative   REG EXERCISE   [place]   LIVES ALONE   END OF LIFE:PATIENT HAS LIVING WILL AND CLEARLY STATES SHE DOES NOT WANT CARDIAC [place] VENTILATION OR OTHER HEROIC OR FUTILE MEASURES.     Physical Exam: BP 92/64   Pulse 88   Ht 5\' 3"  (1.6 m)   Wt 91 lb 12.8 oz (41.6 kg)   BMI 16.26 kg/m  Constitutional: Elderly, very frail woman Psychiatric: alert and oriented x3 Eyes: extraocular movements intact Mouth: oral pharynx moist, no lesions Neck: supple no lymphadenopathy Cardiovascular: heart regular rate and rhythm Lungs: clear to auscultation bilaterally Abdomen: soft, nontender, nondistended, no obvious ascites, no peritoneal signs, normal bowel sounds Extremities: no lower extremity edema bilaterally Skin: no lesions on visible extremities   Assessment and plan: 84 y.o. female with new lesion in head of pancreas   The lesion was noted incidentally on serial imaging for follow-up for presumed renal cell cancer.  We discussed the pros and cons of further testing such as biopsy procedure versus serial imaging surveillance.  She clearly wants to go ahead with biopsy to get more information on this cyst, lesion.  I think it is unlikely that it harbors cancer currently but may be precancerous.  Renal cell can also metastasize really anywhere in the body so perhaps it is related to that.  We discussed endoscopic ultrasound as relatively low risk upper endoscopic procedure and she wants to go ahead with that.  We will  schedule at her soonest convenience   Please see the "Patient Instructions" section for addition details about the plan.   Owens Loffler, MD [facility] 01/20/2018, 8:58 AM  Cc: Dr. Kathlene Cote

## 2018-01-20 NOTE — Patient Instructions (Addendum)
Upper EUS, radial +/- linear with MAC sedation for new pancreatic lesion (cyst).  Normal BMI (Body Mass Index- based on height and weight) is between 23 and 30. Your BMI today is Body mass index is 16.26 kg/m. Marland Kitchen Please consider follow up  regarding your BMI with your Primary Care Provider.

## 2018-02-03 ENCOUNTER — Encounter (HOSPITAL_COMMUNITY): Payer: Self-pay | Admitting: *Deleted

## 2018-02-03 NOTE — Anesthesia Preprocedure Evaluation (Addendum)
Anesthesia Evaluation  Patient identified by MRN, date of birth, ID band Patient awake    Reviewed: Allergy & Precautions, NPO status , Patient's Chart, lab work & pertinent test results  Airway Mallampati: II  TM Distance: >3 FB Neck ROM: Full    Dental  (+) Dental Advisory Given   Pulmonary former smoker,    breath sounds clear to auscultation       Cardiovascular hypertension, Pt. on medications + CAD, + Past MI and + CABG   Rhythm:Regular Rate:Normal     Neuro/Psych Anxiety Depression negative neurological ROS     GI/Hepatic Neg liver ROS, GERD  ,  Endo/Other  Hypothyroidism   Renal/GU Renal disease     Musculoskeletal  (+) Arthritis ,   Abdominal   Peds  Hematology negative hematology ROS (+)   Anesthesia Other Findings   Reproductive/Obstetrics                            Lab Results  Component Value Date   WBC 7.4 11/05/2017   HGB 12.9 11/05/2017   HCT 39.2 11/05/2017   MCV 97.2 11/05/2017   PLT 333.0 11/05/2017   Lab Results  Component Value Date   CREATININE 1.28 (H) 11/05/2017   BUN 21 11/05/2017   NA 139 11/05/2017   K 3.3 (L) 11/05/2017   CL 105 11/05/2017   CO2 24 11/05/2017    Anesthesia Physical Anesthesia Plan  ASA: III  Anesthesia Plan: MAC   Post-op Pain Management:    Induction: Intravenous  PONV Risk Score and Plan: 2 and Propofol infusion and Treatment may vary due to age or medical condition  Airway Management Planned: Natural Airway and Nasal Cannula  Additional Equipment:   Intra-op Plan:   Post-operative Plan:   Informed Consent: I have reviewed the patients History and Physical, chart, labs and discussed the procedure including the risks, benefits and alternatives for the proposed anesthesia with the patient or authorized representative who has indicated his/her understanding and acceptance.     Plan Discussed with:   Anesthesia  Plan Comments:        Anesthesia Quick Evaluation

## 2018-02-04 ENCOUNTER — Ambulatory Visit (HOSPITAL_COMMUNITY)
Admission: RE | Admit: 2018-02-04 | Discharge: 2018-02-04 | Disposition: A | Discharge status: Home / Self Care | Payer: MEDICARE | Source: Ambulatory Visit | Attending: Gastroenterology | Admitting: Gastroenterology

## 2018-02-04 ENCOUNTER — Encounter (HOSPITAL_COMMUNITY)
Admission: RE | Disposition: A | Discharge status: Home / Self Care | Payer: Self-pay | Source: Ambulatory Visit | Attending: Gastroenterology

## 2018-02-04 ENCOUNTER — Encounter (HOSPITAL_COMMUNITY): Payer: Self-pay | Admitting: *Deleted

## 2018-02-04 ENCOUNTER — Encounter: Payer: Self-pay | Admitting: Gastroenterology

## 2018-02-04 ENCOUNTER — Other Ambulatory Visit: Payer: Self-pay

## 2018-02-04 ENCOUNTER — Ambulatory Visit (HOSPITAL_COMMUNITY): Payer: MEDICARE | Admitting: Anesthesiology

## 2018-02-04 DIAGNOSIS — E785 Hyperlipidemia, unspecified: Secondary | ICD-10-CM | POA: Diagnosis not present

## 2018-02-04 DIAGNOSIS — Z888 Allergy status to other drugs, medicaments and biological substances status: Secondary | ICD-10-CM | POA: Insufficient documentation

## 2018-02-04 DIAGNOSIS — F419 Anxiety disorder, unspecified: Secondary | ICD-10-CM | POA: Diagnosis not present

## 2018-02-04 DIAGNOSIS — Z79899 Other long term (current) drug therapy: Secondary | ICD-10-CM | POA: Diagnosis not present

## 2018-02-04 DIAGNOSIS — Z8553 Personal history of malignant neoplasm of renal pelvis: Secondary | ICD-10-CM | POA: Diagnosis not present

## 2018-02-04 DIAGNOSIS — Z86010 Personal history of colonic polyps: Secondary | ICD-10-CM | POA: Insufficient documentation

## 2018-02-04 DIAGNOSIS — K219 Gastro-esophageal reflux disease without esophagitis: Secondary | ICD-10-CM | POA: Diagnosis not present

## 2018-02-04 DIAGNOSIS — Z7982 Long term (current) use of aspirin: Secondary | ICD-10-CM | POA: Diagnosis not present

## 2018-02-04 DIAGNOSIS — M810 Age-related osteoporosis without current pathological fracture: Secondary | ICD-10-CM | POA: Diagnosis not present

## 2018-02-04 DIAGNOSIS — Z87891 Personal history of nicotine dependence: Secondary | ICD-10-CM | POA: Insufficient documentation

## 2018-02-04 DIAGNOSIS — Z951 Presence of aortocoronary bypass graft: Secondary | ICD-10-CM | POA: Diagnosis not present

## 2018-02-04 DIAGNOSIS — M1990 Unspecified osteoarthritis, unspecified site: Secondary | ICD-10-CM | POA: Diagnosis not present

## 2018-02-04 DIAGNOSIS — E039 Hypothyroidism, unspecified: Secondary | ICD-10-CM | POA: Diagnosis not present

## 2018-02-04 DIAGNOSIS — B3781 Candidal esophagitis: Secondary | ICD-10-CM | POA: Diagnosis not present

## 2018-02-04 DIAGNOSIS — K862 Cyst of pancreas: Secondary | ICD-10-CM | POA: Diagnosis not present

## 2018-02-04 DIAGNOSIS — F329 Major depressive disorder, single episode, unspecified: Secondary | ICD-10-CM | POA: Insufficient documentation

## 2018-02-04 DIAGNOSIS — K228 Other specified diseases of esophagus: Secondary | ICD-10-CM | POA: Diagnosis not present

## 2018-02-04 DIAGNOSIS — Z8249 Family history of ischemic heart disease and other diseases of the circulatory system: Secondary | ICD-10-CM | POA: Insufficient documentation

## 2018-02-04 DIAGNOSIS — I252 Old myocardial infarction: Secondary | ICD-10-CM | POA: Diagnosis not present

## 2018-02-04 DIAGNOSIS — I2510 Atherosclerotic heart disease of native coronary artery without angina pectoris: Secondary | ICD-10-CM | POA: Insufficient documentation

## 2018-02-04 DIAGNOSIS — Z882 Allergy status to sulfonamides status: Secondary | ICD-10-CM | POA: Insufficient documentation

## 2018-02-04 DIAGNOSIS — K8689 Other specified diseases of pancreas: Secondary | ICD-10-CM

## 2018-02-04 DIAGNOSIS — I10 Essential (primary) hypertension: Secondary | ICD-10-CM | POA: Insufficient documentation

## 2018-02-04 HISTORY — PX: EUS: SHX5427

## 2018-02-04 HISTORY — PX: FINE NEEDLE ASPIRATION: SHX5430

## 2018-02-04 LAB — PANC CYST FLD ANLYS-PATHFNDR-TG

## 2018-02-04 SURGERY — UPPER ENDOSCOPIC ULTRASOUND (EUS) RADIAL
Anesthesia: Monitor Anesthesia Care

## 2018-02-04 MED ORDER — CIPROFLOXACIN IN D5W 400 MG/200ML IV SOLN
INTRAVENOUS | Status: DC | PRN
  Administered 2018-02-04: 400 mg via INTRAVENOUS

## 2018-02-04 MED ORDER — CIPROFLOXACIN IN D5W 400 MG/200ML IV SOLN
INTRAVENOUS | Status: AC
  Filled 2018-02-04: qty 200

## 2018-02-04 MED ORDER — PROPOFOL 10 MG/ML IV BOLUS
INTRAVENOUS | Status: AC
  Filled 2018-02-04: qty 40

## 2018-02-04 MED ORDER — PROPOFOL 10 MG/ML IV BOLUS
INTRAVENOUS | Status: DC | PRN
  Administered 2018-02-04: 10 mg via INTRAVENOUS
  Administered 2018-02-04: 20 mg via INTRAVENOUS
  Administered 2018-02-04: 10 mg via INTRAVENOUS
  Administered 2018-02-04: 20 mg via INTRAVENOUS
  Administered 2018-02-04: 30 mg via INTRAVENOUS
  Administered 2018-02-04: 20 mg via INTRAVENOUS
  Administered 2018-02-04: 40 mg via INTRAVENOUS

## 2018-02-04 MED ORDER — LACTATED RINGERS IV SOLN
INTRAVENOUS | Status: DC
  Administered 2018-02-04: 07:00:00 via INTRAVENOUS

## 2018-02-04 MED ORDER — FLUCONAZOLE 100 MG PO TABS
100.0000 mg | ORAL_TABLET | Freq: Every day | ORAL | 2 refills | Status: AC
Start: 2018-02-04 — End: 2018-03-06

## 2018-02-04 MED ORDER — CIPROFLOXACIN HCL 500 MG PO TABS: 500.0000 mg | ORAL_TABLET | Freq: Two times a day (BID) | ORAL | 0 refills | Status: DC

## 2018-02-04 MED ORDER — GLYCOPYRROLATE PF 0.2 MG/ML IJ SOSY
PREFILLED_SYRINGE | INTRAMUSCULAR | Status: DC | PRN
  Administered 2018-02-04: .1 mg via INTRAVENOUS

## 2018-02-04 MED ORDER — SODIUM CHLORIDE 0.9 % IV SOLN
INTRAVENOUS | Status: DC
Start: 2018-02-04 — End: 2018-02-04

## 2018-02-04 MED ORDER — LIDOCAINE 2% (20 MG/ML) 5 ML SYRINGE
INTRAMUSCULAR | Status: DC | PRN
  Administered 2018-02-04: 60 mg via INTRAVENOUS

## 2018-02-04 NOTE — Anesthesia Postprocedure Evaluation (Signed)
Anesthesia Post Note  Patient: [name]  Procedure(s) Performed: UPPER ENDOSCOPIC ULTRASOUND (EUS) RADIAL (N/A ) FINE NEEDLE ASPIRATION (FNA) LINEAR (N/A )     Patient location during evaluation: PACU Anesthesia Type: MAC Level of consciousness: awake and alert Pain management: pain level controlled Vital Signs Assessment: post-procedure vital signs reviewed and stable Respiratory status: spontaneous breathing, nonlabored ventilation, respiratory function stable and patient connected to nasal cannula oxygen Cardiovascular status: stable and blood pressure returned to baseline Postop Assessment: no apparent nausea or vomiting Anesthetic complications: no    Last Vitals:  Vitals:   02/04/18 0800 02/04/18 0810  BP: (!) 110/53 127/71  Pulse: (!) 42 70  Resp: 14 16  Temp:    SpO2: 98% 94%    Last Pain:  Vitals:   02/04/18 0810  TempSrc:   PainSc: 0-No pain                 Tiajuana Amass

## 2018-02-04 NOTE — Transfer of Care (Signed)
Immediate Anesthesia Transfer of Care Note  Patient: [name]  Procedure(s) Performed: Procedure(s): UPPER ENDOSCOPIC ULTRASOUND (EUS) RADIAL (N/A)  Patient Location: PACU  Anesthesia Type:MAC  Level of Consciousness: Patient easily awoken, sedated, comfortable, cooperative, following commands, responds to stimulation.   Airway & Oxygen Therapy: Patient spontaneously breathing, ventilating well, oxygen via simple oxygen mask.  Post-op Assessment: Report given to PACU RN, vital signs reviewed and stable, moving all extremities.   Post vital signs: Reviewed and stable.  Complications: No apparent anesthesia complications Last Vitals:  Vitals Value Taken Time  BP    Temp    Pulse 78 02/04/2018  7:52 AM  Resp 15 02/04/2018  7:52 AM  SpO2 100 % 02/04/2018  7:52 AM  Vitals shown include unvalidated device data.  Last Pain:  Vitals:   02/04/18 0620  TempSrc: Oral  PainSc: 0-No pain         Complications: No apparent anesthesia complications

## 2018-02-04 NOTE — Interval H&P Note (Signed)
History and Physical Interval Note:  02/04/2018 7:09 AM  [name]  has presented today for surgery, with the diagnosis of pancreatic cyst  The various methods of treatment have been discussed with the patient and family. After consideration of risks, benefits and other options for treatment, the patient has consented to  Procedure(s): UPPER ENDOSCOPIC ULTRASOUND (EUS) RADIAL (N/A) as a surgical intervention .  The patient's history has been reviewed, patient examined, no change in status, stable for surgery.  I have reviewed the patient's chart and labs.  Questions were answered to the patient's satisfaction.     [name]

## 2018-02-04 NOTE — Anesthesia Procedure Notes (Signed)
Procedure Name: MAC Date/Time: 02/04/2018 7:18 AM Performed by: Deliah Boston, CRNA Pre-anesthesia Checklist: Patient identified, Emergency Drugs available, Suction available, Patient being monitored and Timeout performed Patient Re-evaluated:Patient Re-evaluated prior to induction Oxygen Delivery Method: Nasal cannula Placement Confirmation: positive ETCO2,  breath sounds checked- equal and bilateral and CO2 detector

## 2018-02-04 NOTE — Op Note (Signed)
[facility] Patient Name: [name] Procedure Date: 02/04/2018 MRN: [id number] Attending MD: Milus Banister , MD Date of Birth: [birth year] CSN: [id number] Age: 84 Admit Type: Outpatient Procedure:                Upper EUS Indications:              Pancreatic cyst on CT scan that has increased in                            size Providers:                Milus Banister, MD, Cleda Daub, RN, William Dalton, Technician Referring MD:              Medicines:                Monitored Anesthesia Care, Cipro 967 mg IV Complications:            No immediate complications. Estimated blood loss:                            None. Estimated Blood Loss:     Estimated blood loss: none. Procedure:                Pre-Anesthesia Assessment:                           - Prior to the procedure, a History and Physical                            was performed, and patient medications and                            allergies were reviewed. The patient's tolerance of                            previous anesthesia was also reviewed. The risks                            and benefits of the procedure and the sedation                            options and risks were discussed with the patient.                            All questions were answered, and informed consent                            was obtained. Prior Anticoagulants: The patient has                            taken no previous anticoagulant or antiplatelet  agents. ASA Grade Assessment: II - A patient with                            mild systemic disease. After reviewing the risks                            and benefits, the patient was deemed in                            satisfactory condition to undergo the procedure.                           After obtaining informed consent, the endoscope was                            passed under direct vision. Throughout the                        procedure, the patient's blood pressure, pulse, and                            oxygen saturations were monitored continuously. The                            [id number] ([id number]) was introduced through the                            mouth, and advanced to the second part of duodenum.                            The upper EUS was accomplished without difficulty.                            The patient tolerated the procedure well. Scope In: Scope Out: Findings:      ENDOSCOPIC FINDING: :      1. Diffuse, yellow plaques were found in the entire esophagus;       consistent with candida esophagitis.      2. Otherwise normal UGI tract.      ENDOSONOGRAPHIC FINDING: :      1. An anechoic lesion suggestive of a cyst was identified in the       pancreatic head. It is not in obvious communication with the pancreatic       duct. The lesion measured 16 mm in maximal cross-sectional diameter.       There was a single compartment without septae. There was no associated       mass. There was no internal debris within the fluid-filled cavity.       Diagnostic needle aspiration for fluid was performed. Color Doppler       imaging was utilized prior to needle puncture to confirm a lack of       significant vascular structures within the needle path. One pass was       made with the 22 gauge needle using a transduodenal approach. The amount       of fluid collected was 3 mL. The fluid was clear and watery.  2. A second, smaller cyst was also noted in the head of the pancreas.       This measured 18mm across, was anechoic. There were no associated solid       masses and this cyst also was not in obvious communication with the main       pancreatic duct. This smaller cyst was not sampled.      3. Pancreatic parenchyma was otherwise normal; no solid masses or signs       of chronic pancreatitis.      4. No peripancreatic adenopathy.      5. CBD was normal, non-dilated.      6.  Limited views of liver, spleen were all normal. Impression:               - Recurrent yeast infection of the the esophagus.                            Will call in diflucan for 14 day prescription,                            start now and continue after you have completed the                            cipro                           - Two innocent appearing cysts in the pancreatic                            head without any concerning morphologic features.                            The largest cyst (1.6cm) was sampled with FNA and                            the fluid was sent for CEA, amylase and cytology                            testing. Please complete 3 days of cipro antibiotic                            (called in today). Moderate Sedation:      N/A- Per Anesthesia Care Recommendation:           - Discharge patient to home. Procedure Code(s):        --- Professional ---                           [phone], Esophagogastroduodenoscopy, flexible,                            transoral; with transendoscopic ultrasound-guided                            intramural or transmural fine needle  aspiration/biopsy(s), (includes endoscopic                            ultrasound examination limited to the esophagus,                            stomach or duodenum, and adjacent structures) Diagnosis Code(s):        --- Professional ---                           K22.9, Disease of esophagus, unspecified                           K86.2, Cyst of pancreas CPT copyright 2017 American Medical Association. All rights reserved. The codes documented in this report are preliminary and upon coder review may  be revised to meet current compliance requirements. Milus Banister, MD 02/04/2018 7:55:04 AM This report has been signed electronically. Number of Addenda: 0

## 2018-02-04 NOTE — Discharge Instructions (Signed)
YOU HAD AN ENDOSCOPIC PROCEDURE TODAY: Refer to the procedure report and other information in the discharge instructions given to you for any specific questions about what was found during the examination. If this information does not answer your questions, please call [place] office at [phone] to clarify.   YOU SHOULD EXPECT: Some feelings of bloating in the abdomen. Passage of more gas than usual. Walking can help get rid of the air that was put into your GI tract during the procedure and reduce the bloating. If you had a lower endoscopy (such as a colonoscopy or flexible sigmoidoscopy) you may notice spotting of blood in your stool or on the toilet paper. Some abdominal soreness may be present for a day or two, also.  DIET: Your first meal following the procedure should be a light meal and then it is ok to progress to your normal diet. A half-sandwich or bowl of soup is an example of a good first meal. Heavy or fried foods are harder to digest and may make you feel nauseous or bloated. Drink plenty of fluids but you should avoid alcoholic beverages for 24 hours. If you had a esophageal dilation, please see attached instructions for diet.    ACTIVITY: Your care partner should take you home directly after the procedure. You should plan to take it easy, moving slowly for the rest of the day. You can resume normal activity the day after the procedure however YOU SHOULD NOT DRIVE, use power tools, machinery or perform tasks that involve climbing or major physical exertion for 24 hours (because of the sedation medicines used during the test).   SYMPTOMS TO REPORT IMMEDIATELY: A gastroenterologist can be reached at any hour. Please call [phone]  for any of the following symptoms:   Following upper endoscopy (EGD, EUS, ERCP, esophageal dilation) Vomiting of blood or coffee ground material  New, significant abdominal pain  New, significant chest pain or pain under the shoulder blades  Painful or  persistently difficult swallowing  New shortness of breath  Black, tarry-looking or red, bloody stools  FOLLOW UP:  If any biopsies were taken you will be contacted by phone or by letter within the next 1-3 weeks. Call [phone]  if you have not heard about the biopsies in 3 weeks.  Please also call with any specific questions about appointments or follow up tests.  

## 2018-02-05 ENCOUNTER — Encounter (HOSPITAL_COMMUNITY): Payer: Self-pay | Admitting: Gastroenterology

## 2018-02-11 ENCOUNTER — Encounter: Payer: Self-pay | Admitting: Gastroenterology

## 2018-03-12 ENCOUNTER — Other Ambulatory Visit: Payer: Self-pay | Admitting: Family Medicine

## 2018-03-12 DIAGNOSIS — E785 Hyperlipidemia, unspecified: Secondary | ICD-10-CM

## 2018-03-12 DIAGNOSIS — E876 Hypokalemia: Secondary | ICD-10-CM

## 2018-03-12 DIAGNOSIS — M159 Polyosteoarthritis, unspecified: Secondary | ICD-10-CM

## 2018-03-12 DIAGNOSIS — E039 Hypothyroidism, unspecified: Secondary | ICD-10-CM

## 2018-03-12 DIAGNOSIS — R110 Nausea: Secondary | ICD-10-CM

## 2018-03-12 DIAGNOSIS — G2581 Restless legs syndrome: Secondary | ICD-10-CM

## 2018-03-12 DIAGNOSIS — F419 Anxiety disorder, unspecified: Secondary | ICD-10-CM

## 2018-03-12 DIAGNOSIS — N289 Disorder of kidney and ureter, unspecified: Secondary | ICD-10-CM

## 2018-03-15 NOTE — Telephone Encounter (Signed)
Requesting: xanax 0.5mg  bid prn Contract: yes UDS:11/05/17 Last OV: 11/05/17 Next Ov: 11/08/2018 Last refill: 11/05/17 Database: no discrepancies noted  Requesting: Tramadol 50mg  every 6hr prn Contract: yes UDS: 11/05/17 Last OV: 11/05/17 Next Ov: 11/08/2018 Last refill:12/10/17 Database: no discrepancies noted  Pt. also requesting fluconazole, but warning appeared as pt. also taking simvastatin. Orders pended, please advise.

## 2018-04-30 DIAGNOSIS — H04221 Epiphora due to insufficient drainage, right lacrimal gland: Secondary | ICD-10-CM | POA: Diagnosis not present

## 2018-04-30 DIAGNOSIS — H04123 Dry eye syndrome of bilateral lacrimal glands: Secondary | ICD-10-CM | POA: Diagnosis not present

## 2018-04-30 DIAGNOSIS — H40013 Open angle with borderline findings, low risk, bilateral: Secondary | ICD-10-CM | POA: Diagnosis not present

## 2018-04-30 DIAGNOSIS — H01003 Unspecified blepharitis right eye, unspecified eyelid: Secondary | ICD-10-CM | POA: Diagnosis not present

## 2018-05-09 DIAGNOSIS — Z23 Encounter for immunization: Secondary | ICD-10-CM | POA: Diagnosis not present

## 2018-05-19 ENCOUNTER — Telehealth: Payer: Self-pay

## 2018-05-19 NOTE — Telephone Encounter (Signed)
PA form received from [employer]. Form completed and faxed to [phone].Awaiting determination.

## 2018-05-20 NOTE — Telephone Encounter (Signed)
PA denied. Medicare covers medication when used as an accepted indication. Accepted indications:  Allergic conjunctivitis d/t inhalant allergens/foods Anaphylaxis (adjunt to epinephrine and other standard measures, after the acute manifestations have been controlled Angioedema Dermographism Immune hypersensitivity reaction to blood or plasma Perennial or seasonal allergies Post-operative pain (adjunct therapy) Pre-operative, postoperative, or obstetric sedation Prevention and control of nausea and vomiting associated w/ anesthesia and surgery Sedation (adults and children needing relief from light sleep arousal) Treatment of nausea and vomiting w/ motion sickness Treatment of nausea and vomiting d/t labor Treatment of postoperative nausea and vomiting Urticaria Vasomotor rhinitis  Please note- requested drug is on drug plan as Tier 3 co-pay.

## 2018-05-24 DIAGNOSIS — L570 Actinic keratosis: Secondary | ICD-10-CM | POA: Diagnosis not present

## 2018-05-24 DIAGNOSIS — D692 Other nonthrombocytopenic purpura: Secondary | ICD-10-CM | POA: Diagnosis not present

## 2018-05-24 DIAGNOSIS — D1801 Hemangioma of skin and subcutaneous tissue: Secondary | ICD-10-CM | POA: Diagnosis not present

## 2018-05-24 DIAGNOSIS — L821 Other seborrheic keratosis: Secondary | ICD-10-CM | POA: Diagnosis not present

## 2018-05-26 ENCOUNTER — Telehealth: Payer: Self-pay | Admitting: Family Medicine

## 2018-05-26 NOTE — Telephone Encounter (Signed)
Copied from [place] [id number]. Topic: General - Other >> May 26, 2018  2:11 PM [name], NT wrote:  Reason for CRM: [place]   called and said there a interaction with metoCLOPramide (REGLAN) 10 MG tablet and promethazine (PHENERGAN) 25 MG tablet please call [phone] ref # [id number]

## 2018-05-27 NOTE — Telephone Encounter (Signed)
Agreed, pt has been on combination for at least several mo. Will defer de-escalation of therapy to reg PCP.

## 2018-05-27 NOTE — Telephone Encounter (Signed)
Looks like from her history she has been on both for a while.

## 2018-05-28 ENCOUNTER — Telehealth: Payer: Self-pay | Admitting: *Deleted

## 2018-05-28 NOTE — Telephone Encounter (Signed)
Do you want patient on both?  Advised mail order pharmacy we will await PCP return and also informed patient.

## 2018-05-28 NOTE — Telephone Encounter (Signed)
Called patient back and she stated that she would rather stay on phenergan.  Message sent to provider in previous message.

## 2018-05-28 NOTE — Telephone Encounter (Signed)
Patient called back and said if she had the choice she would rather stay on the phenergan than the reglan.

## 2018-05-28 NOTE — Telephone Encounter (Signed)
Copied from [place] [id number]. Topic: General - Other >> May 28, 2018 10:45 AM Yvette Rack wrote: Reason for CRM: Pt requests return call from [facility] before 4 pm if possible. Cb# [phone]

## 2018-05-31 NOTE — Telephone Encounter (Signed)
yes

## 2018-05-31 NOTE — Telephone Encounter (Signed)
Ok to refill phenergan?

## 2018-05-31 NOTE — Telephone Encounter (Signed)
Author phoned [facility] to update on d/c reglan, and OK to refill phenergan. Author notified pt. And pt. Appreciative.

## 2018-05-31 NOTE — Addendum Note (Signed)
Addended by: Raynelle Dick R on: 05/31/2018 01:36 PM   Modules accepted: Orders

## 2018-05-31 NOTE — Telephone Encounter (Signed)
Insurance has been informed and they will send out the phenergan

## 2018-06-09 ENCOUNTER — Other Ambulatory Visit: Payer: Self-pay | Admitting: Family Medicine

## 2018-06-09 DIAGNOSIS — F419 Anxiety disorder, unspecified: Secondary | ICD-10-CM

## 2018-06-09 DIAGNOSIS — M159 Polyosteoarthritis, unspecified: Secondary | ICD-10-CM

## 2018-06-10 NOTE — Telephone Encounter (Signed)
Pt is requesting refill on tramadol and alprazolam.   Last OV: 11/05/2017 Last Fill on tramadol: 03/15/2018 #120 and 0RF Last Fill on alprazolam: 03/15/2018 #180 and 0RF UDS: 11/05/2017 Low risk

## 2018-07-07 ENCOUNTER — Emergency Department (HOSPITAL_BASED_OUTPATIENT_CLINIC_OR_DEPARTMENT_OTHER): Payer: MEDICARE

## 2018-07-07 ENCOUNTER — Ambulatory Visit: Payer: Self-pay

## 2018-07-07 ENCOUNTER — Encounter (HOSPITAL_BASED_OUTPATIENT_CLINIC_OR_DEPARTMENT_OTHER): Payer: Self-pay | Admitting: *Deleted

## 2018-07-07 ENCOUNTER — Emergency Department (HOSPITAL_BASED_OUTPATIENT_CLINIC_OR_DEPARTMENT_OTHER)
Admission: EM | Admit: 2018-07-07 | Discharge: 2018-07-07 | Disposition: A | Discharge status: Home / Self Care | Payer: MEDICARE | Attending: Emergency Medicine | Admitting: Emergency Medicine

## 2018-07-07 ENCOUNTER — Other Ambulatory Visit: Payer: Self-pay

## 2018-07-07 DIAGNOSIS — S0990XA Unspecified injury of head, initial encounter: Secondary | ICD-10-CM | POA: Diagnosis not present

## 2018-07-07 DIAGNOSIS — Y939 Activity, unspecified: Secondary | ICD-10-CM | POA: Diagnosis not present

## 2018-07-07 DIAGNOSIS — W19XXXA Unspecified fall, initial encounter: Secondary | ICD-10-CM

## 2018-07-07 DIAGNOSIS — Z7982 Long term (current) use of aspirin: Secondary | ICD-10-CM | POA: Diagnosis not present

## 2018-07-07 DIAGNOSIS — W010XXA Fall on same level from slipping, tripping and stumbling without subsequent striking against object, initial encounter: Secondary | ICD-10-CM | POA: Insufficient documentation

## 2018-07-07 DIAGNOSIS — I252 Old myocardial infarction: Secondary | ICD-10-CM | POA: Diagnosis not present

## 2018-07-07 DIAGNOSIS — Y998 Other external cause status: Secondary | ICD-10-CM | POA: Insufficient documentation

## 2018-07-07 DIAGNOSIS — S81812A Laceration without foreign body, left lower leg, initial encounter: Secondary | ICD-10-CM | POA: Insufficient documentation

## 2018-07-07 DIAGNOSIS — E785 Hyperlipidemia, unspecified: Secondary | ICD-10-CM | POA: Diagnosis not present

## 2018-07-07 DIAGNOSIS — M79662 Pain in left lower leg: Secondary | ICD-10-CM | POA: Diagnosis not present

## 2018-07-07 DIAGNOSIS — Y92009 Unspecified place in unspecified non-institutional (private) residence as the place of occurrence of the external cause: Secondary | ICD-10-CM | POA: Insufficient documentation

## 2018-07-07 DIAGNOSIS — Z79899 Other long term (current) drug therapy: Secondary | ICD-10-CM | POA: Insufficient documentation

## 2018-07-07 DIAGNOSIS — Z87891 Personal history of nicotine dependence: Secondary | ICD-10-CM | POA: Diagnosis not present

## 2018-07-07 DIAGNOSIS — S299XXA Unspecified injury of thorax, initial encounter: Secondary | ICD-10-CM | POA: Diagnosis not present

## 2018-07-07 DIAGNOSIS — E039 Hypothyroidism, unspecified: Secondary | ICD-10-CM | POA: Diagnosis not present

## 2018-07-07 DIAGNOSIS — S8992XA Unspecified injury of left lower leg, initial encounter: Secondary | ICD-10-CM | POA: Diagnosis not present

## 2018-07-07 LAB — COMPREHENSIVE METABOLIC PANEL
ALBUMIN: 3.6 g/dL (ref 3.5–5.0)
ALT: 22 U/L (ref 0–44)
AST: 28 U/L (ref 15–41)
Alkaline Phosphatase: 76 U/L (ref 38–126)
Anion gap: 11 (ref 5–15)
BILIRUBIN TOTAL: 0.6 mg/dL (ref 0.3–1.2)
BUN: 14 mg/dL (ref 8–23)
CALCIUM: 9.7 mg/dL (ref 8.9–10.3)
CO2: 24 mmol/L (ref 22–32)
CREATININE: 0.98 mg/dL (ref 0.44–1.00)
Chloride: 101 mmol/L (ref 98–111)
GFR calc Af Amer: 58 mL/min — ABNORMAL LOW (ref >60)
GFR calc non Af Amer: 50 mL/min — ABNORMAL LOW (ref >60)
GLUCOSE: 98 mg/dL (ref 70–99)
Potassium: 4.3 mmol/L (ref 3.5–5.1)
Sodium: 136 mmol/L (ref 135–145)
TOTAL PROTEIN: 6.7 g/dL (ref 6.5–8.1)

## 2018-07-07 LAB — URINALYSIS, ROUTINE W REFLEX MICROSCOPIC
Bilirubin Urine: NEGATIVE
GLUCOSE, UA: NEGATIVE mg/dL
Hgb urine dipstick: NEGATIVE
KETONES UR: NEGATIVE mg/dL
LEUKOCYTES UA: NEGATIVE
Nitrite: NEGATIVE
PH: 5.5 (ref 5.0–8.0)
Protein, ur: NEGATIVE mg/dL
Specific Gravity, Urine: 1.01 (ref 1.005–1.030)

## 2018-07-07 LAB — CBC WITH DIFFERENTIAL/PLATELET
ABS IMMATURE GRANULOCYTES: 0.01 10*3/uL (ref 0.00–0.07)
BASOS PCT: 0 %
Basophils Absolute: 0 10*3/uL (ref 0.0–0.1)
EOS ABS: 0 10*3/uL (ref 0.0–0.5)
EOS PCT: 0 %
HCT: 40.2 % (ref 36.0–46.0)
Hemoglobin: 12.8 g/dL (ref 12.0–15.0)
Immature Granulocytes: 0 %
Lymphocytes Relative: 20 %
Lymphs Abs: 1.4 10*3/uL (ref 0.7–4.0)
MCH: 31.4 pg (ref 26.0–34.0)
MCHC: 31.8 g/dL (ref 30.0–36.0)
MCV: 98.5 fL (ref 80.0–100.0)
MONO ABS: 0.6 10*3/uL (ref 0.1–1.0)
MONOS PCT: 8 %
NEUTROS ABS: 4.9 10*3/uL (ref 1.7–7.7)
Neutrophils Relative %: 72 %
PLATELETS: 230 10*3/uL (ref 150–400)
RBC: 4.08 MIL/uL (ref 3.87–5.11)
RDW: 12.3 % (ref 11.5–15.5)
WBC: 6.9 10*3/uL (ref 4.0–10.5)
nRBC: 0 % (ref 0.0–0.2)

## 2018-07-07 LAB — TROPONIN I: Troponin I: <0.03 ng/mL (ref <0.03)

## 2018-07-07 MED ORDER — CEPHALEXIN 250 MG PO CAPS
250.0000 mg | ORAL_CAPSULE | Freq: Once | ORAL | Status: AC
  Administered 2018-07-07: 250 mg via ORAL
  Filled 2018-07-07: qty 1

## 2018-07-07 MED ORDER — HYDROCODONE-ACETAMINOPHEN 5-325 MG PO TABS
1.0000 | ORAL_TABLET | Freq: Once | ORAL | Status: AC
Start: 2018-07-07 — End: 2018-07-07
  Administered 2018-07-07: 1 via ORAL
  Filled 2018-07-07: qty 1

## 2018-07-07 MED ORDER — CEPHALEXIN 250 MG PO CAPS: 250.0000 mg | ORAL_CAPSULE | Freq: Three times a day (TID) | ORAL | 0 refills | Status: DC

## 2018-07-07 NOTE — ED Provider Notes (Signed)
[place] EMERGENCY DEPARTMENT Provider Note   CSN: [id number] Arrival date & time: 07/07/18  3300     History   Chief Complaint Chief Complaint  Patient presents with  . Fall    HPI [name] is a 84 y.o. female.  The history is provided by the patient and a relative. No language interpreter was used.  Fall    [name] is a 84 y.o. female who presents to the Emergency Department complaining of fall. Resents to the emergency department accompanied by her daughter for evaluation following a fall on Saturday. She lives at home alone was in her living room when she tripped and fell, striking her head as well as her legs. She did not pass out. She has been ambulating since the fall but reports persistent frontal headache as well as persistent and worsening pain to the left leg. She is able to bear weight but has pain with weight-bearing to the shin. There is no chest pain, shortness of breath, nausea, vomiting, dysuria, fevers, hip pain. No recent illnesses or medication changes. She put a dressing on her leg at the time of the fall and has not taken it off since that time. She did have some bleeding through the dressing once. She takes aspirin. Past Medical History:  Diagnosis Date  . Allergy   . Anemia   . Anxiety   . Cataract 05/2011  . Chronic low blood pressure    due to dehydration  . Depression   . Esophageal disorder   . Esophageal yeast infection ([place])   . GERD (gastroesophageal reflux disease)   . Hearing loss    hearing aids  . Hyperlipidemia   . Hypothyroidism   . Myocardial infarction ([place])   . Osteoporosis   . Renal cell carcinoma ([place])    found on CT  . Thyroid disease    hypothyroidism  . Transfusion history    last 2004    Patient Active Problem List   Diagnosis Date Noted  . Pancreatic mass   . Osteoarthritis of multiple joints 11/05/2017  . Atherosclerosis of native coronary artery of native heart without angina pectoris  11/05/2017  . Right renal mass   . Suspicious mole 06/06/2013  . Allergic drug reaction 07/23/2012  . RLS (restless legs syndrome) 05/31/2012  . Insomnia 05/26/2011  . Osteoporosis 05/26/2011  . Hypothyroidism 05/10/2008  . HYPERCALCEMIA 03/16/2008  . UTI 03/06/2008  . WEAKNESS 03/06/2008  . Hyperlipidemia 04/12/2007  . ARTIFICIAL MENOPAUSE 04/12/2007  . ANXIETY 01/12/2007  . DEPRESSION 01/12/2007  . MYOCARDIAL INFARCTION, HX OF 01/12/2007    Past Surgical History:  Procedure Laterality Date  . APPENDECTOMY    . BLEPHAROPLASTY Right    x 3 (all related to basal cell carcinoma)  . COLONOSCOPY    . CORONARY ARTERY BYPASS GRAFT     x2 vessel bypass -Dr. Burt Knack -[facility] 06-22-14  . EUS N/A 02/04/2018   Procedure: UPPER ENDOSCOPIC ULTRASOUND (EUS) RADIAL;  Surgeon: Milus Banister, MD;  Location: [facility] ENDOSCOPY;  Service: Endoscopy;  Laterality: N/A;  . FINE NEEDLE ASPIRATION N/A 02/04/2018   Procedure: FINE NEEDLE ASPIRATION (FNA) LINEAR;  Surgeon: Milus Banister, MD;  Location: [facility] ENDOSCOPY;  Service: Endoscopy;  Laterality: N/A;  . IR GENERIC HISTORICAL  08/29/2014   IR RADIOLOGIST EVAL & MGMT 08/29/2014 Aletta Edouard, MD [facility] INTERV RAD  . IR GENERIC HISTORICAL  10/22/2016   IR RADIOLOGIST EVAL & MGMT 10/22/2016 Aletta Edouard, MD [facility] INTERV RAD  .  IR RADIOLOGIST EVAL & MGMT  11/03/2017  . KYPHOSIS SURGERY    . obstructed ureter Left    repair-operative  . RENAL CRYOABLATION Right 09/2014  . TONSILLECTOMY    . TOTAL ABDOMINAL HYSTERECTOMY W/ BILATERAL SALPINGOOPHORECTOMY       OB History   None      Home Medications    Prior to Admission medications   Medication Sig Start Date End Date Taking? Authorizing Provider  ALPRAZolam Duanne Moron) 0.5 MG tablet TAKE 1 TABLET BY MOUTH  TWICE A DAY AS NEEDED FOR  ANXIETY 06/11/18  Yes Ann Held, DO  aspirin 81 MG EC tablet Take 81 mg by mouth daily.     Yes [provider]  Cholecalciferol (VITAMIN D3)  1000 UNITS CAPS Take 1,000 Units by mouth daily.    Yes [provider]  loratadine (CLARITIN) 10 MG tablet Take 10 mg by mouth daily.   Yes [provider]  Multiple Vitamin (MULTIVITAMIN WITH MINERALS) TABS tablet Take 1 tablet by mouth daily.   Yes [provider]  potassium chloride SA (K-DUR,KLOR-CON) 20 MEQ tablet TAKE 1 TABLET BY MOUTH TWO  TIMES DAILY 03/15/18  Yes Carollee Herter, Yvonne R, DO  promethazine (PHENERGAN) 25 MG tablet TAKE 1 TABLET BY MOUTH TWO  TIMES DAILY 03/15/18  Yes Carollee Herter, Kendrick Fries R, DO  rOPINIRole (REQUIP) 1 MG tablet TAKE 1 TABLET BY MOUTH AT  BEDTIME 03/15/18  Yes Roma Schanz R, DO  simvastatin (ZOCOR) 20 MG tablet TAKE 1 TABLET BY MOUTH  DAILY 03/15/18  Yes Roma Schanz R, DO  traMADol (ULTRAM) 50 MG tablet TAKE 1 TABLET BY MOUTH  EVERY 6 HOURS AS NEEDED FOR PAIN 06/11/18  Yes Roma Schanz R, DO  cephALEXin (KEFLEX) 250 MG capsule Take 1 capsule (250 mg total) by mouth 3 (three) times daily. 07/07/18   Quintella Reichert, MD  ferrous fumarate (FERRETTS) 325 (106 FE) MG TABS Take 1 tablet by mouth daily.     [provider]  fluconazole (DIFLUCAN) 100 MG tablet Take 1 tablet (100 mg total) by mouth daily as needed (yeast infection). 03/15/18   Ann Held, DO  fluticasone (FLONASE) 50 MCG/ACT nasal spray Place 2 sprays into both nostrils daily as needed for allergies or rhinitis.    [provider]  folic acid (FOLVITE) 1 MG tablet Take 1 mg by mouth daily.      [provider]  furosemide (LASIX) 40 MG tablet TAKE 1 TABLET BY MOUTH  DAILY AS NEEDED 03/15/18   Carollee Herter, Alferd Apa, DO  levothyroxine (SYNTHROID, LEVOTHROID) 25 MCG tablet TAKE 1 TABLET BY MOUTH  DAILY BEFORE BREAKFAST 03/15/18   Carollee Herter, Alferd Apa, DO  ranitidine (ZANTAC) 150 MG tablet Take 150 mg by mouth daily.     [provider]  tiZANidine (ZANAFLEX) 2 MG tablet Take 2 mg by mouth daily.     [provider]    Family History Family History  Problem Relation Age of Onset  . Parkinsonism Father   . Hypertension Other   . Heart disease Other        CAD.Marland Kitchen1ST DEGREE RELATIVE <60  . Hyperlipidemia Other   . Breast cancer Sister   . Breast cancer Other   . Colon cancer Neg Hx   . Esophageal cancer Neg Hx   . Rectal cancer Neg Hx   . Stomach cancer Neg Hx     Social History Social History  Tobacco Use  . Smoking status: Former Research scientist (life sciences)  . Smokeless tobacco: Never Used  Substance Use Topics  . Alcohol use: No  . Drug use: No     Allergies   Zofran [ondansetron hcl]; Clarithromycin; Codeine; Diazepam; Sulfonamide derivatives; Prilosec [omeprazole magnesium]; and Trazodone   Review of Systems Review of Systems  All other systems reviewed and are negative.    Physical Exam Updated Vital Signs BP 125/60 (BP Location: Right Arm)   Pulse 66   Temp 97.6 F (36.4 C) (Oral)   Resp 18   Ht 5\' 4"  (1.626 m)   Wt 43.1 kg   SpO2 100%   BMI 16.31 kg/m   Physical Exam  Constitutional: She is oriented to person, place, and time. She appears well-developed and well-nourished.  HENT:  Head: Normocephalic.  Ecchymosis to the forehead and nasal bridge  Eyes: EOM are normal.  Cardiovascular: Normal rate and regular rhythm.  No murmur heard. Pulmonary/Chest: Effort normal and breath sounds normal. No respiratory distress.  Abdominal: Soft. There is no tenderness. There is no rebound and no guarding.  Musculoskeletal:  2+ DP pulses bilaterally. There is a  superficial skin tea superficial abrasion to the right shin. There are two skin tears to the left shin with moderate local edema and erythema. There is no exudate.   Neurological: She is alert and oriented to person, place, and time.  Skin: Skin is warm and dry.  Psychiatric: She has a normal mood and affect. Her behavior is normal.  Nursing note and vitals reviewed.    ED Treatments / Results  Labs (all labs  ordered are listed, but only abnormal results are displayed) Labs Reviewed  COMPREHENSIVE METABOLIC PANEL - Abnormal; Notable for the following components:      Result Value   GFR calc non Af Amer 50 (*)    GFR calc Af Amer 58 (*)    All other components within normal limits  CBC WITH DIFFERENTIAL/PLATELET  TROPONIN I  URINALYSIS, ROUTINE W REFLEX MICROSCOPIC    EKG EKG Interpretation  Date/Time:  Wednesday July 07 2018 09:37:32 EST Ventricular Rate:  81 PR Interval:    QRS Duration: 85 QT Interval:  382 QTC Calculation: 444 R Axis:   50 Text Interpretation:  Sinus rhythm Atrial premature complex Borderline low voltage, extremity leads No significant change since last tracing Confirmed by Quintella Reichert [phone]) on 07/07/2018 9:47:59 AM   Radiology Dg Chest 2 View  Result Date: 07/07/2018 CLINICAL DATA:  Fall. EXAM: CHEST - 2 VIEW COMPARISON:  Radiographs of March 19, 2016. FINDINGS: The heart size and mediastinal contours are within normal limits. Both lungs are clear. Sternotomy wires are noted. No pneumothorax or pleural effusion is noted. The visualized skeletal structures are unremarkable. IMPRESSION: No active cardiopulmonary disease. Electronically Signed   By: Marijo Conception, M.D.   On: 07/07/2018 09:36   Dg Tibia/fibula Left  Result Date: 07/07/2018 CLINICAL DATA:  Left lower leg injury after fall. EXAM: LEFT TIBIA AND FIBULA - 2 VIEW COMPARISON:  None. FINDINGS: There is no evidence of fracture or other focal bone lesions. Soft tissues are unremarkable. IMPRESSION: Negative. Electronically Signed   By: Marijo Conception, M.D.   On: 07/07/2018 09:37   Ct Head Wo Contrast  Result Date: 07/07/2018 CLINICAL DATA:  Pain following recent fall. EXAM: CT HEAD WITHOUT CONTRAST TECHNIQUE: Contiguous axial images were obtained from the base of the skull through the vertex without intravenous contrast. COMPARISON:  Head CT May 31, 2008 and brain MRI April 22, 2009 FINDINGS:  Brain: There is age related volume loss. There is no intracranial mass, hemorrhage, extra-axial fluid collection, or midline shift. There is patchy small vessel disease in the centra semiovale bilaterally. No acute appearing infarct is demonstrable on this study. Vascular: No hyperdense vessel. There are foci of calcification in each distal vertebral artery as well as in the carotid siphon regions. Skull: The bony calvarium appears intact. Sinuses/Orbits: Visualized paranasal sinuses are clear. Visualized orbits appear symmetric bilaterally. Other: Mastoid air cells are clear. IMPRESSION: Age related volume loss with patchy periventricular small vessel disease. No acute infarct. No mass or hemorrhage. Foci of arterial vascular calcification noted. Electronically Signed   By: Lowella Grip III M.D.   On: 07/07/2018 09:35    Procedures Procedures (including critical care time)  Medications Ordered in ED Medications  cephALEXin (KEFLEX) capsule 250 mg (has no administration in time range)  HYDROcodone-acetaminophen (NORCO/VICODIN) 5-325 MG per tablet 1 tablet (1 tablet Oral Given 07/07/18 0929)     Initial Impression / Assessment and Plan / ED Course  I have reviewed the triage vital signs and the nursing notes.  Pertinent labs & imaging results that were available during my care of the patient were reviewed by me and considered in my medical decision making (see chart for details).     Pt here for evaluation of injuries following a fall several days ago. Labs obtained due to unclear cause of her fall. She does have a skin tear to her left shin with concerns for possible developing cellulitis. Labs without acute abnormalities, UA not consistent with UTI. Counseled patient and daughter on home care for skin tear with possible developing infection. Discussed outpatient follow-up as well as return precautions.  Final Clinical Impressions(s) / ED Diagnoses   Final diagnoses:  Fall, initial  encounter  Noninfected skin tear of left lower extremity, initial encounter    ED Discharge Orders         Ordered    cephALEXin (KEFLEX) 250 MG capsule  3 times daily     07/07/18 1040           Quintella Reichert, MD 07/07/18 1042

## 2018-07-07 NOTE — Telephone Encounter (Signed)
Pt daughter called to repot her mother had a fall Saturday. Pt did not seek medical help until today. Daughter took her to ER for evaluation. Of a wound to her left lower leg. Daughter states that the ED doctor instructed her to make appointment with her PCP within a week for further wound evaluation.  Daughter states that the chin has a very large 4 inch area where the skin is gone.She says you can see muscle. ED doctor suggest that she may need a wound specialist. Pt wound was cleaned and dressed today and daughter was instructed in care and dressing changes. Pt was given RX Keflex 250mg  3x daily. Pt had many test and evaluations but they did not find anything medical that would have caused her to fall. Daughter states she just tripped. Appointment scheduled per protocol. Care advice read to pt daughter.  Daughter verbalized understanding. Reason for Disposition . [1] After 14 days AND [2] wound isn't healed    Wound has been since Saturday 07/03/18. ED instructions to see PCP for further evaluations of wound in about a week.  Answer Assessment - Initial Assessment Questions 1. APPEARANCE of INJURY: "What does the injury look like?"      Saturday had fall Skin on lower left leg 2. SIZE: "How large is the cut?"      4 inches right over top of bone 3. BLEEDING: "Is it bleeding now?" If so, ask: "Is it difficult to stop?"      no 4. LOCATION: "Where is the injury located?"      Left lower leg 5. ONSET: "How long ago did the injury occur?"      Saturday 6. MECHANISM: "Tell me how it happened."      fall 7. TETANUS: "When was the last tetanus booster?"     Not given in ED 8. PREGNANCY: "Is there any chance you are pregnant?" "When was your last menstrual period?"     N/A  Protocols used: [place]

## 2018-07-07 NOTE — ED Triage Notes (Signed)
Pt fell and hit her legs, hand, and head on Saturday. She said that her leg and head are not getting better. Hit head on the table and bruised her forehead, and hit legs on medicine box. Legs have been bleeding.

## 2018-07-07 NOTE — ED Notes (Signed)
NAD at this time. Pt is stable and going home.  

## 2018-07-09 ENCOUNTER — Encounter: Payer: Self-pay | Admitting: Family Medicine

## 2018-07-09 ENCOUNTER — Ambulatory Visit (INDEPENDENT_AMBULATORY_CARE_PROVIDER_SITE_OTHER): Payer: MEDICARE | Admitting: Family Medicine

## 2018-07-09 VITALS — BP 116/55 | HR 105 | Temp 98.3°F | Resp 16

## 2018-07-09 DIAGNOSIS — S81802A Unspecified open wound, left lower leg, initial encounter: Secondary | ICD-10-CM | POA: Diagnosis not present

## 2018-07-09 DIAGNOSIS — W19XXXA Unspecified fall, initial encounter: Secondary | ICD-10-CM | POA: Diagnosis not present

## 2018-07-09 DIAGNOSIS — L089 Local infection of the skin and subcutaneous tissue, unspecified: Secondary | ICD-10-CM | POA: Diagnosis not present

## 2018-07-09 DIAGNOSIS — S81801A Unspecified open wound, right lower leg, initial encounter: Secondary | ICD-10-CM | POA: Diagnosis not present

## 2018-07-09 DIAGNOSIS — T148XXA Other injury of unspecified body region, initial encounter: Secondary | ICD-10-CM | POA: Diagnosis not present

## 2018-07-09 DIAGNOSIS — I2510 Atherosclerotic heart disease of native coronary artery without angina pectoris: Secondary | ICD-10-CM | POA: Diagnosis not present

## 2018-07-09 MED ORDER — CEPHALEXIN 500 MG PO CAPS: 500.0000 mg | ORAL_CAPSULE | Freq: Two times a day (BID) | ORAL | 0 refills | Status: DC

## 2018-07-09 MED ORDER — CEFTRIAXONE SODIUM 1 G IJ SOLR
1.0000 g | Freq: Once | INTRAMUSCULAR | Status: AC
  Administered 2018-07-09: 1 g via INTRAMUSCULAR

## 2018-07-09 NOTE — Progress Notes (Signed)
Patient ID: OLIVINE HIERS, female    DOB: [birth year]  Age: 84 y.o. MRN: [id number]    Subjective:  Subjective  HPI LOU IRIGOYEN presents for f/u wounds on legs after fall.  Fall occurred a few weeks ago.  It was almost a week before she agreed to go to the hospital.  She was not using her cane or walker at the time of the injury.  Her daughter , who is with her today,  Brought her to the er for evaluation.  Xray were neg for fracture.   Ct neg for bleed.    Review of Systems  Constitutional: Negative for activity change, appetite change, fatigue and unexpected weight change.  Respiratory: Negative for cough and shortness of breath.   Cardiovascular: Negative for chest pain and palpitations.  Musculoskeletal: Positive for gait problem. Negative for arthralgias and myalgias.  Skin: Positive for wound.  Psychiatric/Behavioral: Negative for behavioral problems and dysphoric mood. The patient is not nervous/anxious.     History Past Medical History:  Diagnosis Date  . Allergy   . Anemia   . Anxiety   . Cataract 05/2011  . Chronic low blood pressure    due to dehydration  . Depression   . Esophageal disorder   . Esophageal yeast infection ([place])   . GERD (gastroesophageal reflux disease)   . Hearing loss    hearing aids  . Hyperlipidemia   . Hypothyroidism   . Myocardial infarction ([place])   . Osteoporosis   . Renal cell carcinoma ([place])    found on CT  . Thyroid disease    hypothyroidism  . Transfusion history    last 2004    She has a past surgical history that includes obstructed ureter (Left); Total abdominal hysterectomy w/ bilateral salpingoophorectomy; Kyphosis surgery; Tonsillectomy; Appendectomy; Colonoscopy; Coronary artery bypass graft; Renal cryoablation (Right, 09/2014); ir generic historical (08/29/2014); ir generic historical (10/22/2016); IR Radiologist Eval & Mgmt (11/03/2017); Blepharoplasty (Right); EUS (N/A, 02/04/2018); and Fine needle aspiration (N/A, 02/04/2018).     Her family history includes Breast cancer in her other and sister; Heart disease in her other; Hyperlipidemia in her other; Hypertension in her other; Parkinsonism in her father.She reports that she has quit smoking. She has never used smokeless tobacco. She reports that she does not drink alcohol or use drugs.  Current Outpatient Medications on File Prior to Visit  Medication Sig Dispense Refill  . ALPRAZolam (XANAX) 0.5 MG tablet TAKE 1 TABLET BY MOUTH  TWICE A DAY AS NEEDED FOR  ANXIETY 180 tablet 1  . aspirin 81 MG EC tablet Take 81 mg by mouth daily.      . cephALEXin (KEFLEX) 250 MG capsule Take 1 capsule (250 mg total) by mouth 3 (three) times daily. 21 capsule 0  . Cholecalciferol (VITAMIN D3) 1000 UNITS CAPS Take 1,000 Units by mouth daily.     . ferrous fumarate (FERRETTS) 325 (106 FE) MG TABS Take 1 tablet by mouth daily.     . fluconazole (DIFLUCAN) 100 MG tablet Take 1 tablet (100 mg total) by mouth daily as needed (yeast infection). 90 tablet 0  . fluticasone (FLONASE) 50 MCG/ACT nasal spray Place 2 sprays into both nostrils daily as needed for allergies or rhinitis.    . folic acid (FOLVITE) 1 MG tablet Take 1 mg by mouth daily.      . furosemide (LASIX) 40 MG tablet TAKE 1 TABLET BY MOUTH  DAILY AS NEEDED 90 tablet 1  . levothyroxine (SYNTHROID, LEVOTHROID)  25 MCG tablet TAKE 1 TABLET BY MOUTH  DAILY BEFORE BREAKFAST 90 tablet 1  . loratadine (CLARITIN) 10 MG tablet Take 10 mg by mouth daily.    . Multiple Vitamin (MULTIVITAMIN WITH MINERALS) TABS tablet Take 1 tablet by mouth daily.    . potassium chloride SA (K-DUR,KLOR-CON) 20 MEQ tablet TAKE 1 TABLET BY MOUTH TWO  TIMES DAILY 180 tablet 1  . promethazine (PHENERGAN) 25 MG tablet TAKE 1 TABLET BY MOUTH TWO  TIMES DAILY 180 tablet 1  . ranitidine (ZANTAC) 150 MG tablet Take 150 mg by mouth daily.     Marland Kitchen rOPINIRole (REQUIP) 1 MG tablet TAKE 1 TABLET BY MOUTH AT  BEDTIME 90 tablet 1  . simvastatin (ZOCOR) 20 MG tablet TAKE 1  TABLET BY MOUTH  DAILY 90 tablet 1  . tiZANidine (ZANAFLEX) 2 MG tablet Take 2 mg by mouth daily.     . traMADol (ULTRAM) 50 MG tablet TAKE 1 TABLET BY MOUTH  EVERY 6 HOURS AS NEEDED FOR PAIN 120 tablet 0   No current facility-administered medications on file prior to visit.      Objective:  Objective  Physical Exam  Constitutional: She is oriented to person, place, and time. She appears well-developed and well-nourished.  HENT:  Head: Normocephalic and atraumatic.  Eyes: Conjunctivae and EOM are normal.  Neck: Normal range of motion. Neck supple. No JVD present. Carotid bruit is not present. No thyromegaly present.  Cardiovascular: Normal rate, regular rhythm and normal heart sounds.  No murmur heard. Pulmonary/Chest: Effort normal and breath sounds normal. No respiratory distress. She has no wheezes. She has no rales. She exhibits no tenderness.  Musculoskeletal: She exhibits no edema.  Neurological: She is alert and oriented to person, place, and time.  Skin: There is erythema.     Psychiatric: She has a normal mood and affect.  Nursing note and vitals reviewed.  BP (!) 116/55 (BP Location: Right Arm, Cuff Size: Normal)   Pulse (!) 105   Temp 98.3 F (36.8 C) (Oral)   Resp 16   SpO2 100%  Wt Readings from Last 3 Encounters:  07/07/18 95 lb (43.1 kg)  02/04/18 91 lb 12.8 oz (41.6 kg)  01/20/18 91 lb 12.8 oz (41.6 kg)     Lab Results  Component Value Date   WBC 6.9 07/07/2018   HGB 12.8 07/07/2018   HCT 40.2 07/07/2018   PLT 230 07/07/2018   GLUCOSE 98 07/07/2018   CHOL 188 11/05/2017   TRIG 103.0 11/05/2017   HDL 70.70 11/05/2017   LDLDIRECT 165.1 05/10/2008   LDLCALC 97 11/05/2017   ALT 22 07/07/2018   AST 28 07/07/2018   NA 136 07/07/2018   K 4.3 07/07/2018   CL 101 07/07/2018   CREATININE 0.98 07/07/2018   BUN 14 07/07/2018   CO2 24 07/07/2018   TSH 3.49 11/05/2017   INR 0.89 09/29/2014   HGBA1C 5.8 10/27/2013    Dg Chest 2 View  Result Date:  07/07/2018 CLINICAL DATA:  Fall. EXAM: CHEST - 2 VIEW COMPARISON:  Radiographs of March 19, 2016. FINDINGS: The heart size and mediastinal contours are within normal limits. Both lungs are clear. Sternotomy wires are noted. No pneumothorax or pleural effusion is noted. The visualized skeletal structures are unremarkable. IMPRESSION: No active cardiopulmonary disease. Electronically Signed   By: Marijo Conception, M.D.   On: 07/07/2018 09:36   Dg Tibia/fibula Left  Result Date: 07/07/2018 CLINICAL DATA:  Left lower leg injury after fall. EXAM:  LEFT TIBIA AND FIBULA - 2 VIEW COMPARISON:  None. FINDINGS: There is no evidence of fracture or other focal bone lesions. Soft tissues are unremarkable. IMPRESSION: Negative. Electronically Signed   By: Marijo Conception, M.D.   On: 07/07/2018 09:37   Ct Head Wo Contrast  Result Date: 07/07/2018 CLINICAL DATA:  Pain following recent fall. EXAM: CT HEAD WITHOUT CONTRAST TECHNIQUE: Contiguous axial images were obtained from the base of the skull through the vertex without intravenous contrast. COMPARISON:  Head CT May 31, 2008 and brain MRI April 22, 2009 FINDINGS: Brain: There is age related volume loss. There is no intracranial mass, hemorrhage, extra-axial fluid collection, or midline shift. There is patchy small vessel disease in the centra semiovale bilaterally. No acute appearing infarct is demonstrable on this study. Vascular: No hyperdense vessel. There are foci of calcification in each distal vertebral artery as well as in the carotid siphon regions. Skull: The bony calvarium appears intact. Sinuses/Orbits: Visualized paranasal sinuses are clear. Visualized orbits appear symmetric bilaterally. Other: Mastoid air cells are clear. IMPRESSION: Age related volume loss with patchy periventricular small vessel disease. No acute infarct. No mass or hemorrhage. Foci of arterial vascular calcification noted. Electronically Signed   By: Lowella Grip III M.D.   On:  07/07/2018 09:35     Assessment & Plan:  Plan  I am having Judee Clara. Dejarnette start on cephALEXin. I am also having her maintain her aspirin, folic acid, Vitamin D3, ferrous fumarate, ranitidine, multivitamin with minerals, tiZANidine, fluticasone, loratadine, levothyroxine, rOPINIRole, furosemide, potassium chloride SA, simvastatin, promethazine, fluconazole, ALPRAZolam, traMADol, and cephALEXin. We administered cefTRIAXone.  Meds ordered this encounter  Medications  . cephALEXin (KEFLEX) 500 MG capsule    Sig: Take 1 capsule (500 mg total) by mouth 2 (two) times daily.    Dispense:  10 capsule    Refill:  0  . cefTRIAXone (ROCEPHIN) injection 1 g    Problem List Items Addressed This Visit      Unprioritized   Fall - Primary    Pt agreed to use her walker  Consider PT -- will discuss more when she returns next week      Wound of left leg    abx per orders  Recheck 1 week or sooner prn       Wound of right leg, initial encounter    abx per orders New dressing in place        Other Visit Diagnoses    Wound infection       Relevant Medications   cephALEXin (KEFLEX) 500 MG capsule   cefTRIAXone (ROCEPHIN) injection 1 g (Completed)      Follow-up: Return in about 1 week (around 07/16/2018).  Ann Held, DO

## 2018-07-09 NOTE — Patient Instructions (Signed)
Fall Prevention in the Home Falls can cause injuries and can affect people from all age groups. There are many simple things that you can do to make your home safe and to help prevent falls. What can I do on the outside of my home?  Regularly repair the edges of walkways and driveways and fix any cracks.  Remove high doorway thresholds.  Trim any shrubbery on the main path into your home.  Use bright outdoor lighting.  Clear walkways of debris and clutter, including tools and rocks.  Regularly check that handrails are securely fastened and in good repair. Both sides of any steps should have handrails.  Install guardrails along the edges of any raised decks or porches.  Have leaves, snow, and ice cleared regularly.  Use sand or salt on walkways during winter months.  In the garage, clean up any spills right away, including grease or oil spills. What can I do in the bathroom?  Use night lights.  Install grab bars by the toilet and in the tub and shower. Do not use towel bars as grab bars.  Use non-skid mats or decals on the floor of the tub or shower.  If you need to sit down while you are in the shower, use a plastic, non-slip stool.  Keep the floor dry. Immediately clean up any water that spills on the floor.  Remove soap buildup in the tub or shower on a regular basis.  Attach bath mats securely with double-sided non-slip rug tape.  Remove throw rugs and other tripping hazards from the floor. What can I do in the bedroom?  Use night lights.  Make sure that a bedside light is easy to reach.  Do not use oversized bedding that drapes onto the floor.  Have a firm chair that has side arms to use for getting dressed.  Remove throw rugs and other tripping hazards from the floor. What can I do in the kitchen?  Clean up any spills right away.  Avoid walking on wet floors.  Place frequently used items in easy-to-reach places.  If you need to reach for something above  you, use a sturdy step stool that has a grab bar.  Keep electrical cables out of the way.  Do not use floor polish or wax that makes floors slippery. If you have to use wax, make sure that it is non-skid floor wax.  Remove throw rugs and other tripping hazards from the floor. What can I do in the stairways?  Do not leave any items on the stairs.  Make sure that there are handrails on both sides of the stairs. Fix handrails that are broken or loose. Make sure that handrails are as long as the stairways.  Check any carpeting to make sure that it is firmly attached to the stairs. Fix any carpet that is loose or worn.  Avoid having throw rugs at the top or bottom of stairways, or secure the rugs with carpet tape to prevent them from moving.  Make sure that you have a light switch at the top of the stairs and the bottom of the stairs. If you do not have them, have them installed. What are some other fall prevention tips?  Wear closed-toe shoes that fit well and support your feet. Wear shoes that have rubber soles or low heels.  When you use a stepladder, make sure that it is completely opened and that the sides are firmly locked. Have someone hold the ladder while you are using   it. Do not climb a closed stepladder.  Add color or contrast paint or tape to grab bars and handrails in your home. Place contrasting color strips on the first and last steps.  Use mobility aids as needed, such as canes, walkers, scooters, and crutches.  Turn on lights if it is dark. Replace any light bulbs that burn out.  Set up furniture so that there are clear paths. Keep the furniture in the same spot.  Fix any uneven floor surfaces.  Choose a carpet design that does not hide the edge of steps of a stairway.  Be aware of any and all pets.  Review your medicines with your healthcare provider. Some medicines can cause dizziness or changes in blood pressure, which increase your risk of falling. Talk with  your health care provider about other ways that you can decrease your risk of falls. This may include working with a physical therapist or trainer to improve your strength, balance, and endurance. This information is not intended to replace advice given to you by your health care provider. Make sure you discuss any questions you have with your health care provider. Document Released: 08/01/2002 Document Revised: 01/08/2016 Document Reviewed: 09/15/2014 Elsevier Interactive Patient Education  2018 [employer]  

## 2018-07-10 DIAGNOSIS — W19XXXA Unspecified fall, initial encounter: Secondary | ICD-10-CM | POA: Insufficient documentation

## 2018-07-10 DIAGNOSIS — S81802A Unspecified open wound, left lower leg, initial encounter: Secondary | ICD-10-CM | POA: Insufficient documentation

## 2018-07-10 DIAGNOSIS — S81801A Unspecified open wound, right lower leg, initial encounter: Secondary | ICD-10-CM | POA: Insufficient documentation

## 2018-07-10 NOTE — Assessment & Plan Note (Signed)
abx per orders  Recheck 1 week or sooner prn

## 2018-07-10 NOTE — Assessment & Plan Note (Signed)
abx per orders New dressing in place

## 2018-07-10 NOTE — Assessment & Plan Note (Signed)
Pt agreed to use her walker  Consider PT -- will discuss more when she returns next week

## 2018-07-12 ENCOUNTER — Other Ambulatory Visit: Payer: Self-pay | Admitting: Family Medicine

## 2018-07-12 DIAGNOSIS — E785 Hyperlipidemia, unspecified: Secondary | ICD-10-CM

## 2018-07-12 DIAGNOSIS — N289 Disorder of kidney and ureter, unspecified: Secondary | ICD-10-CM

## 2018-07-12 DIAGNOSIS — E039 Hypothyroidism, unspecified: Secondary | ICD-10-CM

## 2018-07-12 DIAGNOSIS — E876 Hypokalemia: Secondary | ICD-10-CM

## 2018-07-12 DIAGNOSIS — G2581 Restless legs syndrome: Secondary | ICD-10-CM

## 2018-07-15 ENCOUNTER — Ambulatory Visit (INDEPENDENT_AMBULATORY_CARE_PROVIDER_SITE_OTHER): Payer: MEDICARE | Admitting: Family Medicine

## 2018-07-15 ENCOUNTER — Encounter: Payer: Self-pay | Admitting: Family Medicine

## 2018-07-15 VITALS — BP 100/54 | HR 105 | Temp 98.3°F | Resp 16

## 2018-07-15 DIAGNOSIS — S81801A Unspecified open wound, right lower leg, initial encounter: Secondary | ICD-10-CM

## 2018-07-15 DIAGNOSIS — E785 Hyperlipidemia, unspecified: Secondary | ICD-10-CM

## 2018-07-15 DIAGNOSIS — E039 Hypothyroidism, unspecified: Secondary | ICD-10-CM

## 2018-07-15 DIAGNOSIS — S81802A Unspecified open wound, left lower leg, initial encounter: Secondary | ICD-10-CM | POA: Insufficient documentation

## 2018-07-15 DIAGNOSIS — S81802D Unspecified open wound, left lower leg, subsequent encounter: Secondary | ICD-10-CM

## 2018-07-15 DIAGNOSIS — I2510 Atherosclerotic heart disease of native coronary artery without angina pectoris: Secondary | ICD-10-CM

## 2018-07-15 DIAGNOSIS — T148XXA Other injury of unspecified body region, initial encounter: Principal | ICD-10-CM

## 2018-07-15 DIAGNOSIS — L089 Local infection of the skin and subcutaneous tissue, unspecified: Secondary | ICD-10-CM

## 2018-07-15 LAB — LIPID PANEL
CHOL/HDL RATIO: 3
CHOLESTEROL: 133 mg/dL (ref 0–200)
HDL: 48.4 mg/dL (ref >39.00)
LDL CALC: 64 mg/dL (ref 0–99)
NonHDL: 84.39
Triglycerides: 102 mg/dL (ref 0.0–149.0)
VLDL: 20.4 mg/dL (ref 0.0–40.0)

## 2018-07-15 LAB — COMPREHENSIVE METABOLIC PANEL
ALT: 28 U/L (ref 0–35)
AST: 42 U/L — AB (ref 0–37)
Albumin: 3.7 g/dL (ref 3.5–5.2)
Alkaline Phosphatase: 74 U/L (ref 39–117)
BUN: 20 mg/dL (ref 6–23)
CHLORIDE: 101 meq/L (ref 96–112)
CO2: 25 mEq/L (ref 19–32)
CREATININE: 1.51 mg/dL — AB (ref 0.40–1.20)
Calcium: 9.7 mg/dL (ref 8.4–10.5)
GFR: 34.55 mL/min — ABNORMAL LOW (ref >60.00)
GLUCOSE: 90 mg/dL (ref 70–99)
Potassium: 4.7 mEq/L (ref 3.5–5.1)
SODIUM: 137 meq/L (ref 135–145)
Total Bilirubin: 0.3 mg/dL (ref 0.2–1.2)
Total Protein: 6.1 g/dL (ref 6.0–8.3)

## 2018-07-15 LAB — TSH: TSH: 5.59 u[IU]/mL — ABNORMAL HIGH (ref 0.35–4.50)

## 2018-07-15 MED ORDER — LEVOFLOXACIN 500 MG PO TABS: 500.0000 mg | ORAL_TABLET | Freq: Every day | ORAL | 0 refills | Status: DC

## 2018-07-15 NOTE — Telephone Encounter (Signed)
I sent levaquin to [id number]

## 2018-07-15 NOTE — Assessment & Plan Note (Signed)
Refer to wound clinic con't abx May need to do more imaging  Pt daughter will call next week if it is not improving

## 2018-07-15 NOTE — Progress Notes (Signed)
Patient ID: [name], female    DOB: [birth year]  Age: 84 y.o. MRN: [id number]    Subjective:  Subjective  HPI SHYLO ZAMOR presents for f/u wound both legs.  The r leg is healing but the left one is not improving as fast.    Review of Systems  Constitutional: Negative for appetite change, diaphoresis, fatigue and unexpected weight change.  Eyes: Negative for pain, redness and visual disturbance.  Respiratory: Negative for cough, chest tightness, shortness of breath and wheezing.   Cardiovascular: Negative for chest pain, palpitations and leg swelling.  Endocrine: Negative for cold intolerance, heat intolerance, polydipsia, polyphagia and polyuria.  Genitourinary: Negative for difficulty urinating, dysuria and frequency.  Skin: Positive for color change and wound. Negative for rash.  Neurological: Negative for dizziness, light-headedness, numbness and headaches.    History Past Medical History:  Diagnosis Date  . Allergy   . Anemia   . Anxiety   . Cataract 05/2011  . Chronic low blood pressure    due to dehydration  . Depression   . Esophageal disorder   . Esophageal yeast infection ([place])   . GERD (gastroesophageal reflux disease)   . Hearing loss    hearing aids  . Hyperlipidemia   . Hypothyroidism   . Myocardial infarction ([place])   . Osteoporosis   . Renal cell carcinoma ([place])    found on CT  . Thyroid disease    hypothyroidism  . Transfusion history    last 2004    She has a past surgical history that includes obstructed ureter (Left); Total abdominal hysterectomy w/ bilateral salpingoophorectomy; Kyphosis surgery; Tonsillectomy; Appendectomy; Colonoscopy; Coronary artery bypass graft; Renal cryoablation (Right, 09/2014); ir generic historical (08/29/2014); ir generic historical (10/22/2016); IR Radiologist Eval & Mgmt (11/03/2017); Blepharoplasty (Right); EUS (N/A, 02/04/2018); and Fine needle aspiration (N/A, 02/04/2018).   Her family history includes Breast cancer  in her other and sister; Heart disease in her other; Hyperlipidemia in her other; Hypertension in her other; Parkinsonism in her father.She reports that she has quit smoking. She has never used smokeless tobacco. She reports that she does not drink alcohol or use drugs.  Current Outpatient Medications on File Prior to Visit  Medication Sig Dispense Refill  . ALPRAZolam (XANAX) 0.5 MG tablet TAKE 1 TABLET BY MOUTH  TWICE A DAY AS NEEDED FOR  ANXIETY 180 tablet 1  . aspirin 81 MG EC tablet Take 81 mg by mouth daily.      . cephALEXin (KEFLEX) 500 MG capsule Take 1 capsule (500 mg total) by mouth 2 (two) times daily. 10 capsule 0  . Cholecalciferol (VITAMIN D3) 1000 UNITS CAPS Take 1,000 Units by mouth daily.     . ferrous fumarate (FERRETTS) 325 (106 FE) MG TABS Take 1 tablet by mouth daily.     . fluconazole (DIFLUCAN) 100 MG tablet Take 1 tablet (100 mg total) by mouth daily as needed (yeast infection). 90 tablet 0  . fluticasone (FLONASE) 50 MCG/ACT nasal spray Place 2 sprays into both nostrils daily as needed for allergies or rhinitis.    . folic acid (FOLVITE) 1 MG tablet Take 1 mg by mouth daily.      . furosemide (LASIX) 40 MG tablet TAKE 1 TABLET BY MOUTH  DAILY AS NEEDED 90 tablet 1  . levothyroxine (SYNTHROID, LEVOTHROID) 25 MCG tablet TAKE 1 TABLET BY MOUTH  DAILY BEFORE BREAKFAST 90 tablet 1  . loratadine (CLARITIN) 10 MG tablet Take 10 mg by mouth daily.    Marland Kitchen  Multiple Vitamin (MULTIVITAMIN WITH MINERALS) TABS tablet Take 1 tablet by mouth daily.    . potassium chloride SA (K-DUR,KLOR-CON) 20 MEQ tablet TAKE 1 TABLET BY MOUTH TWO  TIMES DAILY 180 tablet 1  . promethazine (PHENERGAN) 25 MG tablet TAKE 1 TABLET BY MOUTH TWO  TIMES DAILY 180 tablet 1  . ranitidine (ZANTAC) 150 MG tablet Take 150 mg by mouth daily.     Marland Kitchen rOPINIRole (REQUIP) 1 MG tablet TAKE 1 TABLET BY MOUTH AT  BEDTIME 90 tablet 1  . simvastatin (ZOCOR) 20 MG tablet TAKE 1 TABLET BY MOUTH  DAILY 90 tablet 1  . tiZANidine  (ZANAFLEX) 2 MG tablet Take 2 mg by mouth daily.     . traMADol (ULTRAM) 50 MG tablet TAKE 1 TABLET BY MOUTH  EVERY 6 HOURS AS NEEDED FOR PAIN 120 tablet 0   No current facility-administered medications on file prior to visit.      Objective:  Objective  Physical Exam  Constitutional: She is oriented to person, place, and time. She appears well-developed and well-nourished.  HENT:  Head: Normocephalic and atraumatic.  Eyes: Conjunctivae and EOM are normal.  Neck: Normal range of motion. Neck supple. No JVD present. Carotid bruit is not present. No thyromegaly present.  Cardiovascular: Normal rate, regular rhythm and normal heart sounds.  No murmur heard. Pulmonary/Chest: Effort normal and breath sounds normal. No respiratory distress. She has no wheezes. She has no rales. She exhibits no tenderness.  Musculoskeletal: She exhibits no edema.  Neurological: She is alert and oriented to person, place, and time.  Skin: There is erythema.     Psychiatric: She has a normal mood and affect.  Nursing note and vitals reviewed.  BP (!) 100/54 (BP Location: Right Arm, Cuff Size: Normal)   Pulse (!) 105   Temp 98.3 F (36.8 C) (Oral)   Resp 16   SpO2 100%  Wt Readings from Last 3 Encounters:  07/07/18 95 lb (43.1 kg)  02/04/18 91 lb 12.8 oz (41.6 kg)  01/20/18 91 lb 12.8 oz (41.6 kg)     Lab Results  Component Value Date   WBC 6.9 07/07/2018   HGB 12.8 07/07/2018   HCT 40.2 07/07/2018   PLT 230 07/07/2018   GLUCOSE 90 07/15/2018   CHOL 133 07/15/2018   TRIG 102.0 07/15/2018   HDL 48.40 07/15/2018   LDLDIRECT 165.1 05/10/2008   LDLCALC 64 07/15/2018   ALT 28 07/15/2018   AST 42 (H) 07/15/2018   NA 137 07/15/2018   K 4.7 07/15/2018   CL 101 07/15/2018   CREATININE 1.51 (H) 07/15/2018   BUN 20 07/15/2018   CO2 25 07/15/2018   TSH 5.59 (H) 07/15/2018   INR 0.89 09/29/2014   HGBA1C 5.8 10/27/2013    Dg Chest 2 View  Result Date: 07/07/2018 CLINICAL DATA:  Fall. EXAM:  CHEST - 2 VIEW COMPARISON:  Radiographs of March 19, 2016. FINDINGS: The heart size and mediastinal contours are within normal limits. Both lungs are clear. Sternotomy wires are noted. No pneumothorax or pleural effusion is noted. The visualized skeletal structures are unremarkable. IMPRESSION: No active cardiopulmonary disease. Electronically Signed   By: Marijo Conception, M.D.   On: 07/07/2018 09:36   Dg Tibia/fibula Left  Result Date: 07/07/2018 CLINICAL DATA:  Left lower leg injury after fall. EXAM: LEFT TIBIA AND FIBULA - 2 VIEW COMPARISON:  None. FINDINGS: There is no evidence of fracture or other focal bone lesions. Soft tissues are unremarkable. IMPRESSION: Negative. Electronically  Signed   By: Marijo Conception, M.D.   On: 07/07/2018 09:37   Ct Head Wo Contrast  Result Date: 07/07/2018 CLINICAL DATA:  Pain following recent fall. EXAM: CT HEAD WITHOUT CONTRAST TECHNIQUE: Contiguous axial images were obtained from the base of the skull through the vertex without intravenous contrast. COMPARISON:  Head CT May 31, 2008 and brain MRI April 22, 2009 FINDINGS: Brain: There is age related volume loss. There is no intracranial mass, hemorrhage, extra-axial fluid collection, or midline shift. There is patchy small vessel disease in the centra semiovale bilaterally. No acute appearing infarct is demonstrable on this study. Vascular: No hyperdense vessel. There are foci of calcification in each distal vertebral artery as well as in the carotid siphon regions. Skull: The bony calvarium appears intact. Sinuses/Orbits: Visualized paranasal sinuses are clear. Visualized orbits appear symmetric bilaterally. Other: Mastoid air cells are clear. IMPRESSION: Age related volume loss with patchy periventricular small vessel disease. No acute infarct. No mass or hemorrhage. Foci of arterial vascular calcification noted. Electronically Signed   By: Lowella Grip III M.D.   On: 07/07/2018 09:35     Assessment &  Plan:  Plan  I am having Judee Clara. Insalaco maintain her aspirin, folic acid, Vitamin D3, ferrous fumarate, ranitidine, multivitamin with minerals, tiZANidine, fluticasone, loratadine, promethazine, fluconazole, ALPRAZolam, traMADol, cephALEXin, rOPINIRole, levothyroxine, simvastatin, furosemide, and potassium chloride SA.  No orders of the defined types were placed in this encounter.   Problem List Items Addressed This Visit      Unprioritized   Hyperlipidemia    Tolerating statin, encouraged heart healthy diet, avoid trans fats, minimize simple carbs and saturated fats. Increase exercise as tolerated      Hypothyroidism    Check labs       Relevant Orders   TSH (Completed)   Open wound of left lower leg - Primary   Relevant Orders   Ambulatory referral to Wound Clinic   Wound of right leg, initial encounter    Refer to wound clinic con't abx May need to do more imaging  Pt daughter will call next week if it is not improving        Other Visit Diagnoses    Hyperlipidemia LDL goal <100       Relevant Orders   Lipid panel (Completed)   Comprehensive metabolic panel (Completed)      Follow-up: Return in about 6 months (around 01/13/2019), or if symptoms worsen or fail to improve, for hyperlipidemia.  Ann Held, DO

## 2018-07-15 NOTE — Assessment & Plan Note (Signed)
Tolerating statin, encouraged heart healthy diet, avoid trans fats, minimize simple carbs and saturated fats. Increase exercise as tolerated 

## 2018-07-15 NOTE — Assessment & Plan Note (Signed)
Check labs 

## 2018-07-15 NOTE — Patient Instructions (Signed)
Fall Prevention in the Home Falls can cause injuries and can affect people from all age groups. There are many simple things that you can do to make your home safe and to help prevent falls. What can I do on the outside of my home?  Regularly repair the edges of walkways and driveways and fix any cracks.  Remove high doorway thresholds.  Trim any shrubbery on the main path into your home.  Use bright outdoor lighting.  Clear walkways of debris and clutter, including tools and rocks.  Regularly check that handrails are securely fastened and in good repair. Both sides of any steps should have handrails.  Install guardrails along the edges of any raised decks or porches.  Have leaves, snow, and ice cleared regularly.  Use sand or salt on walkways during winter months.  In the garage, clean up any spills right away, including grease or oil spills. What can I do in the bathroom?  Use night lights.  Install grab bars by the toilet and in the tub and shower. Do not use towel bars as grab bars.  Use non-skid mats or decals on the floor of the tub or shower.  If you need to sit down while you are in the shower, use a plastic, non-slip stool.  Keep the floor dry. Immediately clean up any water that spills on the floor.  Remove soap buildup in the tub or shower on a regular basis.  Attach bath mats securely with double-sided non-slip rug tape.  Remove throw rugs and other tripping hazards from the floor. What can I do in the bedroom?  Use night lights.  Make sure that a bedside light is easy to reach.  Do not use oversized bedding that drapes onto the floor.  Have a firm chair that has side arms to use for getting dressed.  Remove throw rugs and other tripping hazards from the floor. What can I do in the kitchen?  Clean up any spills right away.  Avoid walking on wet floors.  Place frequently used items in easy-to-reach places.  If you need to reach for something above  you, use a sturdy step stool that has a grab bar.  Keep electrical cables out of the way.  Do not use floor polish or wax that makes floors slippery. If you have to use wax, make sure that it is non-skid floor wax.  Remove throw rugs and other tripping hazards from the floor. What can I do in the stairways?  Do not leave any items on the stairs.  Make sure that there are handrails on both sides of the stairs. Fix handrails that are broken or loose. Make sure that handrails are as long as the stairways.  Check any carpeting to make sure that it is firmly attached to the stairs. Fix any carpet that is loose or worn.  Avoid having throw rugs at the top or bottom of stairways, or secure the rugs with carpet tape to prevent them from moving.  Make sure that you have a light switch at the top of the stairs and the bottom of the stairs. If you do not have them, have them installed. What are some other fall prevention tips?  Wear closed-toe shoes that fit well and support your feet. Wear shoes that have rubber soles or low heels.  When you use a stepladder, make sure that it is completely opened and that the sides are firmly locked. Have someone hold the ladder while you are using   it. Do not climb a closed stepladder.  Add color or contrast paint or tape to grab bars and handrails in your home. Place contrasting color strips on the first and last steps.  Use mobility aids as needed, such as canes, walkers, scooters, and crutches.  Turn on lights if it is dark. Replace any light bulbs that burn out.  Set up furniture so that there are clear paths. Keep the furniture in the same spot.  Fix any uneven floor surfaces.  Choose a carpet design that does not hide the edge of steps of a stairway.  Be aware of any and all pets.  Review your medicines with your healthcare provider. Some medicines can cause dizziness or changes in blood pressure, which increase your risk of falling. Talk with  your health care provider about other ways that you can decrease your risk of falls. This may include working with a physical therapist or trainer to improve your strength, balance, and endurance. This information is not intended to replace advice given to you by your health care provider. Make sure you discuss any questions you have with your health care provider. Document Released: 08/01/2002 Document Revised: 01/08/2016 Document Reviewed: 09/15/2014 Elsevier Interactive Patient Education  2018 [employer]  

## 2018-07-18 ENCOUNTER — Encounter: Payer: Self-pay | Admitting: Family Medicine

## 2018-07-18 DIAGNOSIS — E039 Hypothyroidism, unspecified: Secondary | ICD-10-CM

## 2018-07-18 DIAGNOSIS — R7989 Other specified abnormal findings of blood chemistry: Secondary | ICD-10-CM

## 2018-07-21 ENCOUNTER — Telehealth: Payer: Self-pay | Admitting: Family Medicine

## 2018-07-21 DIAGNOSIS — E039 Hypothyroidism, unspecified: Secondary | ICD-10-CM

## 2018-07-21 NOTE — Telephone Encounter (Signed)
Copied from [place] [phone]. Topic: Quick Communication - Rx Refill/Question >> Jul 21, 2018 10:13 AM [name] wrote: Medication: new generic Synthroid  67mcg #90 with 1 refill (see mychart)  Has the patient contacted their pharmacy? Yes.   (Agent: If no, request that the patient contact the pharmacy for the refill.) (Agent: If yes, when and what did the pharmacy advise?)  Preferred Pharmacy (with phone number or street name): [facility]  Agent: Please be advised that RX refills may take up to 3 business days. We ask that you follow-up with your pharmacy.

## 2018-07-26 ENCOUNTER — Other Ambulatory Visit: Payer: Self-pay | Admitting: *Deleted

## 2018-07-26 ENCOUNTER — Other Ambulatory Visit: Payer: Self-pay | Admitting: Family Medicine

## 2018-07-26 ENCOUNTER — Encounter (HOSPITAL_BASED_OUTPATIENT_CLINIC_OR_DEPARTMENT_OTHER): Payer: Self-pay

## 2018-07-26 ENCOUNTER — Encounter (HOSPITAL_BASED_OUTPATIENT_CLINIC_OR_DEPARTMENT_OTHER): Payer: MEDICARE | Attending: Internal Medicine

## 2018-07-26 ENCOUNTER — Telehealth: Payer: Self-pay | Admitting: Family Medicine

## 2018-07-26 DIAGNOSIS — Z85528 Personal history of other malignant neoplasm of kidney: Secondary | ICD-10-CM | POA: Diagnosis not present

## 2018-07-26 DIAGNOSIS — H9193 Unspecified hearing loss, bilateral: Secondary | ICD-10-CM | POA: Diagnosis not present

## 2018-07-26 DIAGNOSIS — I87332 Chronic venous hypertension (idiopathic) with ulcer and inflammation of left lower extremity: Secondary | ICD-10-CM | POA: Insufficient documentation

## 2018-07-26 DIAGNOSIS — M810 Age-related osteoporosis without current pathological fracture: Secondary | ICD-10-CM | POA: Insufficient documentation

## 2018-07-26 DIAGNOSIS — S81812A Laceration without foreign body, left lower leg, initial encounter: Secondary | ICD-10-CM | POA: Diagnosis not present

## 2018-07-26 DIAGNOSIS — I2510 Atherosclerotic heart disease of native coronary artery without angina pectoris: Secondary | ICD-10-CM | POA: Diagnosis not present

## 2018-07-26 DIAGNOSIS — R7989 Other specified abnormal findings of blood chemistry: Secondary | ICD-10-CM

## 2018-07-26 DIAGNOSIS — L97822 Non-pressure chronic ulcer of other part of left lower leg with fat layer exposed: Secondary | ICD-10-CM | POA: Insufficient documentation

## 2018-07-26 DIAGNOSIS — L089 Local infection of the skin and subcutaneous tissue, unspecified: Secondary | ICD-10-CM

## 2018-07-26 DIAGNOSIS — I252 Old myocardial infarction: Secondary | ICD-10-CM | POA: Insufficient documentation

## 2018-07-26 DIAGNOSIS — E039 Hypothyroidism, unspecified: Secondary | ICD-10-CM

## 2018-07-26 DIAGNOSIS — T148XXA Other injury of unspecified body region, initial encounter: Principal | ICD-10-CM

## 2018-07-26 MED ORDER — LEVOTHYROXINE SODIUM 50 MCG PO TABS: 50.0000 ug | ORAL_TABLET | Freq: Every day | ORAL | 1 refills | Status: DC

## 2018-07-26 MED ORDER — LEVOFLOXACIN 500 MG PO TABS: 500.0000 mg | ORAL_TABLET | Freq: Every day | ORAL | 0 refills | Status: DC

## 2018-07-26 NOTE — Telephone Encounter (Signed)
Refill sent.

## 2018-07-26 NOTE — Telephone Encounter (Signed)
Copied from [place] [phone]. Topic: Quick Communication - See Telephone Encounter >> Jul 26, 2018  8:28 AM Conception Chancy, NT wrote: patient daughter is calling and states the Wound Care can not see the patient until 08/02/18. She would like to know if the antibiotic can be refilled. She states this medicine is helping. She believes the medicine is called levofloxacin (LEVAQUIN) 500 MG tablet. She states it was the last medicine she was prescribed.  [facility] # [address], [place] [address] [place] [address] Phone: [phone] Fax: [phone]

## 2018-07-29 DIAGNOSIS — I87322 Chronic venous hypertension (idiopathic) with inflammation of left lower extremity: Secondary | ICD-10-CM | POA: Diagnosis not present

## 2018-07-29 DIAGNOSIS — F329 Major depressive disorder, single episode, unspecified: Secondary | ICD-10-CM | POA: Diagnosis not present

## 2018-07-29 DIAGNOSIS — M810 Age-related osteoporosis without current pathological fracture: Secondary | ICD-10-CM | POA: Diagnosis not present

## 2018-07-29 DIAGNOSIS — I2510 Atherosclerotic heart disease of native coronary artery without angina pectoris: Secondary | ICD-10-CM | POA: Diagnosis not present

## 2018-07-29 DIAGNOSIS — D649 Anemia, unspecified: Secondary | ICD-10-CM | POA: Diagnosis not present

## 2018-07-29 DIAGNOSIS — S81812D Laceration without foreign body, left lower leg, subsequent encounter: Secondary | ICD-10-CM | POA: Diagnosis not present

## 2018-08-02 ENCOUNTER — Encounter: Payer: Self-pay | Admitting: Family Medicine

## 2018-08-02 DIAGNOSIS — I87332 Chronic venous hypertension (idiopathic) with ulcer and inflammation of left lower extremity: Secondary | ICD-10-CM | POA: Diagnosis not present

## 2018-08-02 DIAGNOSIS — I2510 Atherosclerotic heart disease of native coronary artery without angina pectoris: Secondary | ICD-10-CM | POA: Diagnosis not present

## 2018-08-02 DIAGNOSIS — H9193 Unspecified hearing loss, bilateral: Secondary | ICD-10-CM | POA: Diagnosis not present

## 2018-08-02 DIAGNOSIS — M810 Age-related osteoporosis without current pathological fracture: Secondary | ICD-10-CM | POA: Diagnosis not present

## 2018-08-02 DIAGNOSIS — L97822 Non-pressure chronic ulcer of other part of left lower leg with fat layer exposed: Secondary | ICD-10-CM | POA: Diagnosis not present

## 2018-08-02 DIAGNOSIS — I252 Old myocardial infarction: Secondary | ICD-10-CM | POA: Diagnosis not present

## 2018-08-02 DIAGNOSIS — I872 Venous insufficiency (chronic) (peripheral): Secondary | ICD-10-CM | POA: Diagnosis not present

## 2018-08-02 MED ORDER — LEVOTHYROXINE SODIUM 50 MCG PO TABS: 50.0000 ug | ORAL_TABLET | Freq: Every day | ORAL | 1 refills | Status: DC

## 2018-08-02 NOTE — Addendum Note (Signed)
Addended byDamita Dunnings D on: 08/02/2018 02:05 PM   Modules accepted: Orders

## 2018-08-04 DIAGNOSIS — M810 Age-related osteoporosis without current pathological fracture: Secondary | ICD-10-CM | POA: Diagnosis not present

## 2018-08-04 DIAGNOSIS — I87322 Chronic venous hypertension (idiopathic) with inflammation of left lower extremity: Secondary | ICD-10-CM | POA: Diagnosis not present

## 2018-08-04 DIAGNOSIS — S81812D Laceration without foreign body, left lower leg, subsequent encounter: Secondary | ICD-10-CM | POA: Diagnosis not present

## 2018-08-04 DIAGNOSIS — D649 Anemia, unspecified: Secondary | ICD-10-CM | POA: Diagnosis not present

## 2018-08-04 DIAGNOSIS — F329 Major depressive disorder, single episode, unspecified: Secondary | ICD-10-CM | POA: Diagnosis not present

## 2018-08-04 DIAGNOSIS — I2510 Atherosclerotic heart disease of native coronary artery without angina pectoris: Secondary | ICD-10-CM | POA: Diagnosis not present

## 2018-08-09 ENCOUNTER — Other Ambulatory Visit: Payer: Self-pay | Admitting: Family Medicine

## 2018-08-09 DIAGNOSIS — I252 Old myocardial infarction: Secondary | ICD-10-CM | POA: Diagnosis not present

## 2018-08-09 DIAGNOSIS — M810 Age-related osteoporosis without current pathological fracture: Secondary | ICD-10-CM | POA: Diagnosis not present

## 2018-08-09 DIAGNOSIS — H9193 Unspecified hearing loss, bilateral: Secondary | ICD-10-CM | POA: Diagnosis not present

## 2018-08-09 DIAGNOSIS — I87332 Chronic venous hypertension (idiopathic) with ulcer and inflammation of left lower extremity: Secondary | ICD-10-CM | POA: Diagnosis not present

## 2018-08-09 DIAGNOSIS — I2510 Atherosclerotic heart disease of native coronary artery without angina pectoris: Secondary | ICD-10-CM | POA: Diagnosis not present

## 2018-08-09 DIAGNOSIS — S81812A Laceration without foreign body, left lower leg, initial encounter: Secondary | ICD-10-CM | POA: Diagnosis not present

## 2018-08-09 DIAGNOSIS — L97822 Non-pressure chronic ulcer of other part of left lower leg with fat layer exposed: Secondary | ICD-10-CM | POA: Diagnosis not present

## 2018-08-09 DIAGNOSIS — M159 Polyosteoarthritis, unspecified: Secondary | ICD-10-CM

## 2018-08-09 NOTE — Telephone Encounter (Signed)
Copied from [place] [id number]. Topic: Quick Communication - Rx Refill/Question >> Aug 09, 2018  9:19 AM Leward Quan A wrote: Medication: traMADol (ULTRAM) 50 MG tablet   Has the patient contacted their pharmacy? Yes.   (Agent: If no, request that the patient contact the pharmacy for the refill.) (Agent: If yes, when and what did the pharmacy advise?)  Preferred Pharmacy (with phone number or street name): [place], [place] [phone] (Phone) [phone] (Fax)    Agent: Please be advised that RX refills may take up to 3 business days. We ask that you follow-up with your pharmacy.

## 2018-08-10 ENCOUNTER — Telehealth: Payer: Self-pay | Admitting: *Deleted

## 2018-08-10 NOTE — Telephone Encounter (Signed)
Copied from [place] [phone]. Topic: General - Other >> Aug 10, 2018  9:26 AM Bea Graff, NT wrote: Reason for CRM: Pt states that [facility] will not ship her medicine until the physician responds to their fax. Can refax to [phone]. Phone#: [phone].

## 2018-08-11 DIAGNOSIS — M810 Age-related osteoporosis without current pathological fracture: Secondary | ICD-10-CM | POA: Diagnosis not present

## 2018-08-11 DIAGNOSIS — I2510 Atherosclerotic heart disease of native coronary artery without angina pectoris: Secondary | ICD-10-CM | POA: Diagnosis not present

## 2018-08-11 DIAGNOSIS — S81812D Laceration without foreign body, left lower leg, subsequent encounter: Secondary | ICD-10-CM | POA: Diagnosis not present

## 2018-08-11 DIAGNOSIS — I87322 Chronic venous hypertension (idiopathic) with inflammation of left lower extremity: Secondary | ICD-10-CM | POA: Diagnosis not present

## 2018-08-11 DIAGNOSIS — D649 Anemia, unspecified: Secondary | ICD-10-CM | POA: Diagnosis not present

## 2018-08-11 DIAGNOSIS — F329 Major depressive disorder, single episode, unspecified: Secondary | ICD-10-CM | POA: Diagnosis not present

## 2018-08-12 ENCOUNTER — Other Ambulatory Visit: Payer: Self-pay | Admitting: Family Medicine

## 2018-08-12 DIAGNOSIS — M159 Polyosteoarthritis, unspecified: Secondary | ICD-10-CM

## 2018-08-12 NOTE — Telephone Encounter (Signed)
Form faxed to pharmacy

## 2018-08-13 DIAGNOSIS — I87322 Chronic venous hypertension (idiopathic) with inflammation of left lower extremity: Secondary | ICD-10-CM | POA: Diagnosis not present

## 2018-08-13 DIAGNOSIS — M810 Age-related osteoporosis without current pathological fracture: Secondary | ICD-10-CM | POA: Diagnosis not present

## 2018-08-13 DIAGNOSIS — D649 Anemia, unspecified: Secondary | ICD-10-CM | POA: Diagnosis not present

## 2018-08-13 DIAGNOSIS — F329 Major depressive disorder, single episode, unspecified: Secondary | ICD-10-CM | POA: Diagnosis not present

## 2018-08-13 DIAGNOSIS — S81812D Laceration without foreign body, left lower leg, subsequent encounter: Secondary | ICD-10-CM | POA: Diagnosis not present

## 2018-08-13 DIAGNOSIS — I2510 Atherosclerotic heart disease of native coronary artery without angina pectoris: Secondary | ICD-10-CM | POA: Diagnosis not present

## 2018-08-13 MED ORDER — TRAMADOL HCL 50 MG PO TABS: 50.0000 mg | ORAL_TABLET | Freq: Four times a day (QID) | ORAL | 0 refills | Status: DC | PRN

## 2018-08-13 NOTE — Telephone Encounter (Signed)
Last Tramadol RX: 06/11/18, #120 Last OV: 07/15/18 Next OV: 11/08/18 UDS: 11/05/17, low risk CSC: 11/05/17 CSR: No discrepancies identified

## 2018-08-16 DIAGNOSIS — L97822 Non-pressure chronic ulcer of other part of left lower leg with fat layer exposed: Secondary | ICD-10-CM | POA: Diagnosis not present

## 2018-08-16 DIAGNOSIS — I2510 Atherosclerotic heart disease of native coronary artery without angina pectoris: Secondary | ICD-10-CM | POA: Diagnosis not present

## 2018-08-16 DIAGNOSIS — H9193 Unspecified hearing loss, bilateral: Secondary | ICD-10-CM | POA: Diagnosis not present

## 2018-08-16 DIAGNOSIS — M810 Age-related osteoporosis without current pathological fracture: Secondary | ICD-10-CM | POA: Diagnosis not present

## 2018-08-16 DIAGNOSIS — I252 Old myocardial infarction: Secondary | ICD-10-CM | POA: Diagnosis not present

## 2018-08-16 DIAGNOSIS — I87332 Chronic venous hypertension (idiopathic) with ulcer and inflammation of left lower extremity: Secondary | ICD-10-CM | POA: Diagnosis not present

## 2018-08-16 DIAGNOSIS — S81812A Laceration without foreign body, left lower leg, initial encounter: Secondary | ICD-10-CM | POA: Diagnosis not present

## 2018-08-17 ENCOUNTER — Other Ambulatory Visit (HOSPITAL_COMMUNITY): Payer: Self-pay | Admitting: Internal Medicine

## 2018-08-17 DIAGNOSIS — I739 Peripheral vascular disease, unspecified: Secondary | ICD-10-CM

## 2018-08-20 ENCOUNTER — Ambulatory Visit (HOSPITAL_COMMUNITY)
Admission: RE | Admit: 2018-08-20 | Discharge: 2018-08-20 | Disposition: A | Discharge status: Home / Self Care | Payer: MEDICARE | Source: Ambulatory Visit | Attending: Family Medicine | Admitting: Family Medicine

## 2018-08-20 DIAGNOSIS — I739 Peripheral vascular disease, unspecified: Secondary | ICD-10-CM | POA: Insufficient documentation

## 2018-08-23 DIAGNOSIS — I252 Old myocardial infarction: Secondary | ICD-10-CM | POA: Diagnosis not present

## 2018-08-23 DIAGNOSIS — M810 Age-related osteoporosis without current pathological fracture: Secondary | ICD-10-CM | POA: Diagnosis not present

## 2018-08-23 DIAGNOSIS — S81812A Laceration without foreign body, left lower leg, initial encounter: Secondary | ICD-10-CM | POA: Diagnosis not present

## 2018-08-23 DIAGNOSIS — I87332 Chronic venous hypertension (idiopathic) with ulcer and inflammation of left lower extremity: Secondary | ICD-10-CM | POA: Diagnosis not present

## 2018-08-23 DIAGNOSIS — I2510 Atherosclerotic heart disease of native coronary artery without angina pectoris: Secondary | ICD-10-CM | POA: Diagnosis not present

## 2018-08-23 DIAGNOSIS — L97822 Non-pressure chronic ulcer of other part of left lower leg with fat layer exposed: Secondary | ICD-10-CM | POA: Diagnosis not present

## 2018-08-23 DIAGNOSIS — H9193 Unspecified hearing loss, bilateral: Secondary | ICD-10-CM | POA: Diagnosis not present

## 2018-08-26 DIAGNOSIS — D649 Anemia, unspecified: Secondary | ICD-10-CM | POA: Diagnosis not present

## 2018-08-26 DIAGNOSIS — I2510 Atherosclerotic heart disease of native coronary artery without angina pectoris: Secondary | ICD-10-CM | POA: Diagnosis not present

## 2018-08-26 DIAGNOSIS — I87322 Chronic venous hypertension (idiopathic) with inflammation of left lower extremity: Secondary | ICD-10-CM | POA: Diagnosis not present

## 2018-08-26 DIAGNOSIS — M810 Age-related osteoporosis without current pathological fracture: Secondary | ICD-10-CM | POA: Diagnosis not present

## 2018-08-26 DIAGNOSIS — F329 Major depressive disorder, single episode, unspecified: Secondary | ICD-10-CM | POA: Diagnosis not present

## 2018-08-26 DIAGNOSIS — S81812D Laceration without foreign body, left lower leg, subsequent encounter: Secondary | ICD-10-CM | POA: Diagnosis not present

## 2018-08-30 ENCOUNTER — Encounter (HOSPITAL_BASED_OUTPATIENT_CLINIC_OR_DEPARTMENT_OTHER): Payer: MEDICARE | Attending: Internal Medicine

## 2018-08-30 DIAGNOSIS — I87332 Chronic venous hypertension (idiopathic) with ulcer and inflammation of left lower extremity: Secondary | ICD-10-CM | POA: Insufficient documentation

## 2018-08-30 DIAGNOSIS — S81812A Laceration without foreign body, left lower leg, initial encounter: Secondary | ICD-10-CM | POA: Diagnosis not present

## 2018-08-30 DIAGNOSIS — I252 Old myocardial infarction: Secondary | ICD-10-CM | POA: Diagnosis not present

## 2018-08-30 DIAGNOSIS — L97822 Non-pressure chronic ulcer of other part of left lower leg with fat layer exposed: Secondary | ICD-10-CM | POA: Insufficient documentation

## 2018-09-01 DIAGNOSIS — S81812D Laceration without foreign body, left lower leg, subsequent encounter: Secondary | ICD-10-CM | POA: Diagnosis not present

## 2018-09-01 DIAGNOSIS — I87322 Chronic venous hypertension (idiopathic) with inflammation of left lower extremity: Secondary | ICD-10-CM | POA: Diagnosis not present

## 2018-09-01 DIAGNOSIS — M810 Age-related osteoporosis without current pathological fracture: Secondary | ICD-10-CM | POA: Diagnosis not present

## 2018-09-01 DIAGNOSIS — F329 Major depressive disorder, single episode, unspecified: Secondary | ICD-10-CM | POA: Diagnosis not present

## 2018-09-01 DIAGNOSIS — I2510 Atherosclerotic heart disease of native coronary artery without angina pectoris: Secondary | ICD-10-CM | POA: Diagnosis not present

## 2018-09-01 DIAGNOSIS — D649 Anemia, unspecified: Secondary | ICD-10-CM | POA: Diagnosis not present

## 2018-09-03 ENCOUNTER — Telehealth: Payer: Self-pay | Admitting: *Deleted

## 2018-09-03 NOTE — Telephone Encounter (Signed)
Received Missed Visit Report from [facility]; forwarded to provider/SLS 01/10

## 2018-09-06 DIAGNOSIS — I252 Old myocardial infarction: Secondary | ICD-10-CM | POA: Diagnosis not present

## 2018-09-06 DIAGNOSIS — S81812A Laceration without foreign body, left lower leg, initial encounter: Secondary | ICD-10-CM | POA: Diagnosis not present

## 2018-09-06 DIAGNOSIS — L97822 Non-pressure chronic ulcer of other part of left lower leg with fat layer exposed: Secondary | ICD-10-CM | POA: Diagnosis not present

## 2018-09-06 DIAGNOSIS — I87332 Chronic venous hypertension (idiopathic) with ulcer and inflammation of left lower extremity: Secondary | ICD-10-CM | POA: Diagnosis not present

## 2018-09-09 DIAGNOSIS — D649 Anemia, unspecified: Secondary | ICD-10-CM | POA: Diagnosis not present

## 2018-09-09 DIAGNOSIS — M810 Age-related osteoporosis without current pathological fracture: Secondary | ICD-10-CM | POA: Diagnosis not present

## 2018-09-09 DIAGNOSIS — I87322 Chronic venous hypertension (idiopathic) with inflammation of left lower extremity: Secondary | ICD-10-CM | POA: Diagnosis not present

## 2018-09-09 DIAGNOSIS — S81812D Laceration without foreign body, left lower leg, subsequent encounter: Secondary | ICD-10-CM | POA: Diagnosis not present

## 2018-09-09 DIAGNOSIS — F329 Major depressive disorder, single episode, unspecified: Secondary | ICD-10-CM | POA: Diagnosis not present

## 2018-09-09 DIAGNOSIS — I2510 Atherosclerotic heart disease of native coronary artery without angina pectoris: Secondary | ICD-10-CM | POA: Diagnosis not present

## 2018-09-13 DIAGNOSIS — L97822 Non-pressure chronic ulcer of other part of left lower leg with fat layer exposed: Secondary | ICD-10-CM | POA: Diagnosis not present

## 2018-09-13 DIAGNOSIS — I87332 Chronic venous hypertension (idiopathic) with ulcer and inflammation of left lower extremity: Secondary | ICD-10-CM | POA: Diagnosis not present

## 2018-09-13 DIAGNOSIS — S81812A Laceration without foreign body, left lower leg, initial encounter: Secondary | ICD-10-CM | POA: Diagnosis not present

## 2018-09-13 DIAGNOSIS — I252 Old myocardial infarction: Secondary | ICD-10-CM | POA: Diagnosis not present

## 2018-09-17 DIAGNOSIS — M810 Age-related osteoporosis without current pathological fracture: Secondary | ICD-10-CM | POA: Diagnosis not present

## 2018-09-17 DIAGNOSIS — D649 Anemia, unspecified: Secondary | ICD-10-CM | POA: Diagnosis not present

## 2018-09-17 DIAGNOSIS — F329 Major depressive disorder, single episode, unspecified: Secondary | ICD-10-CM | POA: Diagnosis not present

## 2018-09-17 DIAGNOSIS — I2510 Atherosclerotic heart disease of native coronary artery without angina pectoris: Secondary | ICD-10-CM | POA: Diagnosis not present

## 2018-09-17 DIAGNOSIS — S81812D Laceration without foreign body, left lower leg, subsequent encounter: Secondary | ICD-10-CM | POA: Diagnosis not present

## 2018-09-17 DIAGNOSIS — I87322 Chronic venous hypertension (idiopathic) with inflammation of left lower extremity: Secondary | ICD-10-CM | POA: Diagnosis not present

## 2018-09-20 DIAGNOSIS — I252 Old myocardial infarction: Secondary | ICD-10-CM | POA: Diagnosis not present

## 2018-09-20 DIAGNOSIS — S81812A Laceration without foreign body, left lower leg, initial encounter: Secondary | ICD-10-CM | POA: Diagnosis not present

## 2018-09-20 DIAGNOSIS — L97822 Non-pressure chronic ulcer of other part of left lower leg with fat layer exposed: Secondary | ICD-10-CM | POA: Diagnosis not present

## 2018-09-20 DIAGNOSIS — I87332 Chronic venous hypertension (idiopathic) with ulcer and inflammation of left lower extremity: Secondary | ICD-10-CM | POA: Diagnosis not present

## 2018-09-22 DIAGNOSIS — D649 Anemia, unspecified: Secondary | ICD-10-CM | POA: Diagnosis not present

## 2018-09-22 DIAGNOSIS — I2510 Atherosclerotic heart disease of native coronary artery without angina pectoris: Secondary | ICD-10-CM | POA: Diagnosis not present

## 2018-09-22 DIAGNOSIS — M810 Age-related osteoporosis without current pathological fracture: Secondary | ICD-10-CM | POA: Diagnosis not present

## 2018-09-22 DIAGNOSIS — I87322 Chronic venous hypertension (idiopathic) with inflammation of left lower extremity: Secondary | ICD-10-CM | POA: Diagnosis not present

## 2018-09-22 DIAGNOSIS — S81812D Laceration without foreign body, left lower leg, subsequent encounter: Secondary | ICD-10-CM | POA: Diagnosis not present

## 2018-09-22 DIAGNOSIS — F329 Major depressive disorder, single episode, unspecified: Secondary | ICD-10-CM | POA: Diagnosis not present

## 2018-09-27 ENCOUNTER — Encounter (HOSPITAL_BASED_OUTPATIENT_CLINIC_OR_DEPARTMENT_OTHER): Payer: MEDICARE | Attending: Internal Medicine

## 2018-09-27 ENCOUNTER — Telehealth: Payer: Self-pay | Admitting: *Deleted

## 2018-09-27 ENCOUNTER — Other Ambulatory Visit (INDEPENDENT_AMBULATORY_CARE_PROVIDER_SITE_OTHER): Payer: MEDICARE

## 2018-09-27 DIAGNOSIS — E039 Hypothyroidism, unspecified: Secondary | ICD-10-CM

## 2018-09-27 DIAGNOSIS — R7989 Other specified abnormal findings of blood chemistry: Secondary | ICD-10-CM

## 2018-09-27 DIAGNOSIS — Z09 Encounter for follow-up examination after completed treatment for conditions other than malignant neoplasm: Secondary | ICD-10-CM | POA: Diagnosis not present

## 2018-09-27 DIAGNOSIS — I872 Venous insufficiency (chronic) (peripheral): Secondary | ICD-10-CM | POA: Diagnosis not present

## 2018-09-27 DIAGNOSIS — Z872 Personal history of diseases of the skin and subcutaneous tissue: Secondary | ICD-10-CM | POA: Insufficient documentation

## 2018-09-27 DIAGNOSIS — S81812A Laceration without foreign body, left lower leg, initial encounter: Secondary | ICD-10-CM | POA: Diagnosis not present

## 2018-09-27 LAB — COMPREHENSIVE METABOLIC PANEL
ALBUMIN: 3.8 g/dL (ref 3.5–5.2)
ALT: 42 U/L — ABNORMAL HIGH (ref 0–35)
AST: 33 U/L (ref 0–37)
Alkaline Phosphatase: 68 U/L (ref 39–117)
BUN: 12 mg/dL (ref 6–23)
CALCIUM: 9.6 mg/dL (ref 8.4–10.5)
CHLORIDE: 102 meq/L (ref 96–112)
CO2: 26 meq/L (ref 19–32)
CREATININE: 1.03 mg/dL (ref 0.40–1.20)
GFR: 50.52 mL/min — ABNORMAL LOW (ref >60.00)
Glucose, Bld: 95 mg/dL (ref 70–99)
POTASSIUM: 3.8 meq/L (ref 3.5–5.1)
Sodium: 139 mEq/L (ref 135–145)
Total Bilirubin: 0.4 mg/dL (ref 0.2–1.2)
Total Protein: 6 g/dL (ref 6.0–8.3)

## 2018-09-27 LAB — TSH: TSH: 2.73 u[IU]/mL (ref 0.35–4.50)

## 2018-09-27 NOTE — Telephone Encounter (Signed)
Received Discharge-Transfer Summary for review from [facility]; forwarded to provider/SLS 02/03

## 2018-10-04 ENCOUNTER — Encounter: Payer: Self-pay | Admitting: Family Medicine

## 2018-10-05 ENCOUNTER — Other Ambulatory Visit: Payer: Self-pay | Admitting: *Deleted

## 2018-10-05 DIAGNOSIS — R748 Abnormal levels of other serum enzymes: Secondary | ICD-10-CM

## 2018-10-05 NOTE — Telephone Encounter (Signed)
Spoke with daughter and she will call patient.  Orders placed for [place] lab.

## 2018-10-05 NOTE — Addendum Note (Signed)
Addended by: Kem Boroughs D on: 10/05/2018 07:09 PM   Modules accepted: Orders

## 2018-10-06 ENCOUNTER — Other Ambulatory Visit (INDEPENDENT_AMBULATORY_CARE_PROVIDER_SITE_OTHER): Payer: MEDICARE

## 2018-10-06 DIAGNOSIS — R748 Abnormal levels of other serum enzymes: Secondary | ICD-10-CM | POA: Diagnosis not present

## 2018-10-06 LAB — IBC PANEL
IRON: 72 ug/dL (ref 42–145)
Saturation Ratios: 24.4 % (ref 20.0–50.0)
Transferrin: 211 mg/dL — ABNORMAL LOW (ref 212.0–360.0)

## 2018-10-06 LAB — IRON: Iron: 72 ug/dL (ref 42–145)

## 2018-10-06 LAB — FERRITIN: Ferritin: 87.7 ng/mL (ref 10.0–291.0)

## 2018-10-07 LAB — HEPATITIS B SURFACE ANTIGEN: HEP B S AG: NONREACTIVE

## 2018-10-07 LAB — HEPATITIS C ANTIBODY
Hepatitis C Ab: NONREACTIVE
SIGNAL TO CUT-OFF: 0.01 (ref <1.00)

## 2018-10-11 ENCOUNTER — Other Ambulatory Visit: Payer: Self-pay | Admitting: Family Medicine

## 2018-10-11 DIAGNOSIS — M159 Polyosteoarthritis, unspecified: Secondary | ICD-10-CM

## 2018-10-11 DIAGNOSIS — N289 Disorder of kidney and ureter, unspecified: Secondary | ICD-10-CM

## 2018-10-11 DIAGNOSIS — G2581 Restless legs syndrome: Secondary | ICD-10-CM

## 2018-10-11 DIAGNOSIS — R110 Nausea: Secondary | ICD-10-CM

## 2018-10-11 DIAGNOSIS — E876 Hypokalemia: Secondary | ICD-10-CM

## 2018-10-11 DIAGNOSIS — F419 Anxiety disorder, unspecified: Secondary | ICD-10-CM

## 2018-10-11 DIAGNOSIS — E039 Hypothyroidism, unspecified: Secondary | ICD-10-CM

## 2018-10-11 DIAGNOSIS — E785 Hyperlipidemia, unspecified: Secondary | ICD-10-CM

## 2018-10-11 NOTE — Telephone Encounter (Signed)
Last written: alprazolam 06/11/18  Tramadol 08/13/18 Last ov: 07/15/18 Next ov: 11/08/18  Contract: will get at 11/08/18 UDS: will get at 11/08/18

## 2018-10-12 ENCOUNTER — Encounter: Payer: Self-pay | Admitting: *Deleted

## 2018-10-14 ENCOUNTER — Other Ambulatory Visit: Payer: Self-pay | Admitting: Radiology

## 2018-10-14 ENCOUNTER — Other Ambulatory Visit: Payer: Self-pay | Admitting: Interventional Radiology

## 2018-10-14 DIAGNOSIS — N2889 Other specified disorders of kidney and ureter: Secondary | ICD-10-CM

## 2018-10-18 ENCOUNTER — Other Ambulatory Visit: Payer: Self-pay | Admitting: *Deleted

## 2018-10-18 DIAGNOSIS — N2889 Other specified disorders of kidney and ureter: Secondary | ICD-10-CM

## 2018-10-19 ENCOUNTER — Telehealth: Payer: Self-pay

## 2018-10-19 NOTE — Telephone Encounter (Signed)
PA approved through 08/25/2019.  

## 2018-10-19 NOTE — Telephone Encounter (Signed)
PA initiated via Covermymeds; KEY: [id number]. Awaiting determination.

## 2018-10-26 DIAGNOSIS — D3131 Benign neoplasm of right choroid: Secondary | ICD-10-CM | POA: Diagnosis not present

## 2018-10-26 DIAGNOSIS — H40013 Open angle with borderline findings, low risk, bilateral: Secondary | ICD-10-CM | POA: Diagnosis not present

## 2018-10-26 DIAGNOSIS — H353132 Nonexudative age-related macular degeneration, bilateral, intermediate dry stage: Secondary | ICD-10-CM | POA: Diagnosis not present

## 2018-10-26 DIAGNOSIS — Z961 Presence of intraocular lens: Secondary | ICD-10-CM | POA: Diagnosis not present

## 2018-10-28 ENCOUNTER — Other Ambulatory Visit: Payer: Self-pay

## 2018-10-28 ENCOUNTER — Inpatient Hospital Stay (HOSPITAL_COMMUNITY): Admission: RE | Admit: 2018-10-28 | Payer: Self-pay | Source: Ambulatory Visit

## 2018-11-05 NOTE — Progress Notes (Deleted)
Subjective:   [name] is a 85 y.o. female who presents for Medicare Annual (Subsequent) preventive examination.  Review of Systems: No ROS.  Medicare Wellness Visit. Additional risk factors are reflected in the social history.   Sleep patterns: Home Safety/Smoke Alarms: Feels safe in home. Smoke alarms in place.   Female:     Mammo- declines      Dexa scan-            Objective:     Vitals: There were no vitals taken for this visit.  There is no height or weight on file to calculate BMI.  Advanced Directives 07/07/2018 02/04/2018 11/05/2017 10/23/2016 03/19/2016 09/27/2015 05/04/2015  Does Patient Have a Medical Advance Directive? No Yes Yes Yes Yes Yes Yes  Type of Advance Directive - [place];Living will [place];Living will [place];Living will [place];Living will [place];Living will [place];Living will  Does patient want to make changes to medical advance directive? - - - - - No - Patient declined -  Copy of [place] in Chart? - Yes Yes Yes No - copy requested No - copy requested -  Would patient like information on creating a medical advance directive? Yes (MAU/Ambulatory/Procedural Areas - Information given) - - - - - -    Tobacco Social History   Tobacco Use  Smoking Status Former Smoker  Smokeless Tobacco Never Used     Counseling given: Not Answered   Clinical Intake:                       Past Medical History:  Diagnosis Date  . Allergy   . Anemia   . Anxiety   . Cataract 05/2011  . Chronic low blood pressure    due to dehydration  . Depression   . Esophageal disorder   . Esophageal yeast infection ([place])   . GERD (gastroesophageal reflux disease)   . Hearing loss    hearing aids  . Hyperlipidemia   . Hypothyroidism   . Myocardial infarction ([place])   . Osteoporosis   . Renal cell carcinoma ([place])     found on CT  . Thyroid disease    hypothyroidism  . Transfusion history    last 2004   Past Surgical History:  Procedure Laterality Date  . APPENDECTOMY    . BLEPHAROPLASTY Right    x 3 (all related to basal cell carcinoma)  . COLONOSCOPY    . CORONARY ARTERY BYPASS GRAFT     x2 vessel bypass -Dr. Burt Knack -[place] LOV 06-22-14  . EUS N/A 02/04/2018   Procedure: UPPER ENDOSCOPIC ULTRASOUND (EUS) RADIAL;  Surgeon: Milus Banister, MD;  Location: [facility] ENDOSCOPY;  Service: Endoscopy;  Laterality: N/A;  . FINE NEEDLE ASPIRATION N/A 02/04/2018   Procedure: FINE NEEDLE ASPIRATION (FNA) LINEAR;  Surgeon: Milus Banister, MD;  Location: [facility];  Service: Endoscopy;  Laterality: N/A;  . IR GENERIC HISTORICAL  08/29/2014   IR RADIOLOGIST EVAL & MGMT 08/29/2014 Aletta Edouard, MD [facility] INTERV RAD  . IR GENERIC HISTORICAL  10/22/2016   IR RADIOLOGIST EVAL & MGMT 10/22/2016 Aletta Edouard, MD [facility] INTERV RAD  . IR RADIOLOGIST EVAL & MGMT  11/03/2017  . KYPHOSIS SURGERY    . obstructed ureter Left    repair-operative  . RENAL CRYOABLATION Right 09/2014  . TONSILLECTOMY    . TOTAL ABDOMINAL HYSTERECTOMY W/ BILATERAL SALPINGOOPHORECTOMY  Family History  Problem Relation Age of Onset  . Parkinsonism Father   . Hypertension Other   . Heart disease Other        CAD.Marland Kitchen1ST DEGREE RELATIVE <60  . Hyperlipidemia Other   . Breast cancer Sister   . Breast cancer Other   . Colon cancer Neg Hx   . Esophageal cancer Neg Hx   . Rectal cancer Neg Hx   . Stomach cancer Neg Hx    Social History   Socioeconomic History  . Marital status: Single    Spouse name: Not on file  . Number of children: 2  . Years of education: Not on file  . Highest education level: Not on file  Occupational History  . Occupation: Retired    Fish farm manager: RETIRED  Social Needs  . Financial resource strain: Not on file  . Food insecurity:    Worry: Not on file    Inability: Not on file  . Transportation needs:     Medical: Not on file    Non-medical: Not on file  Tobacco Use  . Smoking status: Former Research scientist (life sciences)  . Smokeless tobacco: Never Used  Substance and Sexual Activity  . Alcohol use: No  . Drug use: No  . Sexual activity: Not Currently  Lifestyle  . Physical activity:    Days per week: Not on file    Minutes per session: Not on file  . Stress: Not on file  Relationships  . Social connections:    Talks on phone: Not on file    Gets together: Not on file    Attends religious service: Not on file    Active member of club or organization: Not on file    Attends meetings of clubs or organizations: Not on file    Relationship status: Not on file  Other Topics Concern  . Not on file  Social History Narrative   REG EXERCISE   [place]   LIVES ALONE   END OF LIFE:PATIENT HAS LIVING WILL AND CLEARLY STATES SHE DOES NOT WANT CARDIAC [place] VENTILATION OR OTHER HEROIC OR FUTILE MEASURES.    Outpatient Encounter Medications as of 11/08/2018  Medication Sig  . ALPRAZolam (XANAX) 0.5 MG tablet TAKE 1 TABLET BY MOUTH  TWICE A DAY AS NEEDED FOR  ANXIETY  . aspirin 81 MG EC tablet Take 81 mg by mouth daily.    . cephALEXin (KEFLEX) 500 MG capsule Take 1 capsule (500 mg total) by mouth 2 (two) times daily.  . Cholecalciferol (VITAMIN D3) 1000 UNITS CAPS Take 1,000 Units by mouth daily.   . ferrous fumarate (FERRETTS) 325 (106 FE) MG TABS Take 1 tablet by mouth daily.   . fluconazole (DIFLUCAN) 100 MG tablet TAKE 1 TABLET BY MOUTH  DAILY AS NEEDED FOR YEAST  INFECTION  . fluticasone (FLONASE) 50 MCG/ACT nasal spray Place 2 sprays into both nostrils daily as needed for allergies or rhinitis.  . folic acid (FOLVITE) 1 MG tablet Take 1 mg by mouth daily.    . furosemide (LASIX) 40 MG tablet TAKE 1 TABLET BY MOUTH  DAILY AS NEEDED  . levofloxacin (LEVAQUIN) 500 MG tablet Take 1 tablet (500 mg total) by mouth daily.  Marland Kitchen levothyroxine (SYNTHROID, LEVOTHROID) 50 MCG tablet TAKE 1 TABLET BY MOUTH   DAILY  . loratadine (CLARITIN) 10 MG tablet Take 10 mg by mouth daily.  . Multiple Vitamin (MULTIVITAMIN WITH MINERALS) TABS tablet Take 1 tablet by mouth daily.  . potassium chloride SA (K-DUR,KLOR-CON) 20 MEQ  tablet TAKE 1 TABLET BY MOUTH TWO  TIMES DAILY  . promethazine (PHENERGAN) 25 MG tablet TAKE 1 TABLET BY MOUTH TWO  TIMES DAILY  . ranitidine (ZANTAC) 150 MG tablet Take 150 mg by mouth daily.   Marland Kitchen rOPINIRole (REQUIP) 1 MG tablet TAKE 1 TABLET BY MOUTH AT  BEDTIME  . simvastatin (ZOCOR) 20 MG tablet TAKE 1 TABLET BY MOUTH  DAILY  . tiZANidine (ZANAFLEX) 2 MG tablet Take 2 mg by mouth daily.   . traMADol (ULTRAM) 50 MG tablet TAKE 1 TABLET BY MOUTH  EVERY 6 HOURS AS NEEDED FOR PAIN   No facility-administered encounter medications on file as of 11/08/2018.     Activities of Daily Living In your present state of health, do you have any difficulty performing the following activities: 11/05/2017  Hearing? Y  Comment hearing aid right ear  Vision? N  Comment hx cataract sx. wears readers. Dr.Hecker every 6 months  Difficulty concentrating or making decisions? N  Walking or climbing stairs? Y  Comment slow per pt  Dressing or bathing? N  Doing errands, shopping? N  Preparing Food and eating ? N  Using the Toilet? N  In the past six months, have you accidently leaked urine? Y  Comment wears pads  Do you have problems with loss of bowel control? N  Managing your Medications? N  Managing your Finances? N  Housekeeping or managing your Housekeeping? N  Some recent data might be hidden    Patient Care Team: Carollee Herter, Alferd Apa, DO as PCP - General Milus Banister, MD as Consulting Physician (Gastroenterology) Aletta Edouard, MD as Consulting Physician (Interventional Radiology) [place], Amy, MD as Consulting Physician (Dermatology) Sherren Mocha, MD as Consulting Physician (Cardiology) Monna Fam, MD as Consulting Physician (Ophthalmology)    Assessment:   This is a  routine wellness examination for Jelani. .nope  Exercise Activities and Dietary recommendations   Diet (meal preparation, eat out, water intake, caffeinated beverages, dairy products, fruits and vegetables): {Desc; diets:16563} Breakfast: Lunch:  Dinner:      Goals    . Healthy Lifestyle     Eat heart healthy diet (full of fruits, vegetables, whole grains, lean protein, water--limit salt, fat, and sugar intake) and increase physical activity as tolerated. Continue doing brain stimulating activities (puzzles, reading, adult coloring books, staying active) to keep memory sharp.     . Maintain current health and independence       Fall Risk Fall Risk  11/05/2017 11/05/2017 10/23/2016 09/27/2015 09/27/2015  Falls in the past year? No Yes Yes Yes Yes  Number falls in past yr: - - 1 1 1   Comment - - Slid off sofa reaching for walker. - -  Injury with Fall? - No No Yes Yes  Risk Factor Category  - - - High Fall Risk -  Risk for fall due to : - Other (Comment);Impaired balance/gait - Impaired balance/gait;Impaired mobility -  Follow up - - - Education provided -    Depression Screen PHQ 2/9 Scores 11/05/2017 10/23/2016 09/27/2015 09/27/2015  PHQ - 2 Score 0 0 0 0     Cognitive Function MMSE - Mini Mental State Exam 11/05/2017 10/23/2016  Orientation to time 5 5  Orientation to Place 5 5  Registration 3 3  Attention/ Calculation 5 5  Recall 3 3  Language- name 2 objects 2 2  Language- repeat 1 1  Language- follow 3 step command 3 3  Language- read & follow direction 1 1  Write  a sentence 1 1  Copy design 1 1  Total score 30 30        Immunization History  Administered Date(s) Administered  . Influenza Split 05/26/2011, 05/31/2012  . Influenza Whole 05/31/2008, 05/17/2009, 05/22/2010  . Influenza, High Dose Seasonal PF 05/21/2014, 04/30/2015, 05/18/2016  . Influenza,inj,Quad PF,6+ Mos 06/06/2013  . Influenza-Unspecified 05/09/2018  . Pneumococcal Conjugate-13 08/11/2014  . Pneumococcal  Polysaccharide-23 06/11/2005  . Tdap 06/08/2016    Screening Tests Health Maintenance  Topic Date Due  . TETANUS/TDAP  06/08/2026  . INFLUENZA VACCINE  Completed  . DEXA SCAN  Completed  . PNA vac Low Risk Adult  Completed       Plan:   ***   I have personally reviewed and noted the following in the patient's chart:   . Medical and social history . Use of alcohol, tobacco or illicit drugs  . Current medications and supplements . Functional ability and status . Nutritional status . Physical activity . Advanced directives . List of other physicians . Hospitalizations, surgeries, and ER visits in previous 12 months . Vitals . Screenings to include cognitive, depression, and falls . Referrals and appointments  In addition, I have reviewed and discussed with patient certain preventive protocols, quality metrics, and best practice recommendations. A written personalized care plan for preventive services as well as general preventive health recommendations were provided to patient.     Shela Nevin, [place]  11/05/2018

## 2018-11-08 ENCOUNTER — Ambulatory Visit (INDEPENDENT_AMBULATORY_CARE_PROVIDER_SITE_OTHER): Payer: MEDICARE | Admitting: Family Medicine

## 2018-11-08 ENCOUNTER — Other Ambulatory Visit: Payer: Self-pay

## 2018-11-08 ENCOUNTER — Ambulatory Visit: Payer: Self-pay | Admitting: *Deleted

## 2018-11-08 ENCOUNTER — Encounter: Payer: Self-pay | Admitting: Family Medicine

## 2018-11-08 VITALS — BP 96/62 | HR 76 | Temp 97.6°F | Resp 12 | Ht 63.0 in | Wt 86.0 lb

## 2018-11-08 DIAGNOSIS — E039 Hypothyroidism, unspecified: Secondary | ICD-10-CM

## 2018-11-08 DIAGNOSIS — R531 Weakness: Secondary | ICD-10-CM | POA: Diagnosis not present

## 2018-11-08 DIAGNOSIS — Z0000 Encounter for general adult medical examination without abnormal findings: Secondary | ICD-10-CM | POA: Insufficient documentation

## 2018-11-08 DIAGNOSIS — E785 Hyperlipidemia, unspecified: Secondary | ICD-10-CM

## 2018-11-08 LAB — CBC WITH DIFFERENTIAL/PLATELET
Basophils Absolute: 0 10*3/uL (ref 0.0–0.1)
Basophils Relative: 0.5 % (ref 0.0–3.0)
Eosinophils Absolute: 0 10*3/uL (ref 0.0–0.7)
Eosinophils Relative: 0.5 % (ref 0.0–5.0)
HCT: 43 % (ref 36.0–46.0)
Hemoglobin: 14.3 g/dL (ref 12.0–15.0)
LYMPHS ABS: 1.9 10*3/uL (ref 0.7–4.0)
Lymphocytes Relative: 25.2 % (ref 12.0–46.0)
MCHC: 33.4 g/dL (ref 30.0–36.0)
MCV: 95.2 fl (ref 78.0–100.0)
Monocytes Absolute: 0.6 10*3/uL (ref 0.1–1.0)
Monocytes Relative: 7.5 % (ref 3.0–12.0)
NEUTROS ABS: 4.9 10*3/uL (ref 1.4–7.7)
NEUTROS PCT: 66.3 % (ref 43.0–77.0)
Platelets: 280 10*3/uL (ref 150.0–400.0)
RBC: 4.51 Mil/uL (ref 3.87–5.11)
RDW: 13.1 % (ref 11.5–15.5)
WBC: 7.3 10*3/uL (ref 4.0–10.5)

## 2018-11-08 LAB — COMPREHENSIVE METABOLIC PANEL
ALT: 25 U/L (ref 0–35)
AST: 28 U/L (ref 0–37)
Albumin: 4.2 g/dL (ref 3.5–5.2)
Alkaline Phosphatase: 75 U/L (ref 39–117)
BUN: 24 mg/dL — ABNORMAL HIGH (ref 6–23)
CO2: 23 meq/L (ref 19–32)
Calcium: 9.6 mg/dL (ref 8.4–10.5)
Chloride: 102 mEq/L (ref 96–112)
Creatinine, Ser: 1.57 mg/dL — ABNORMAL HIGH (ref 0.40–1.20)
GFR: 31.05 mL/min — ABNORMAL LOW (ref >60.00)
Glucose, Bld: 97 mg/dL (ref 70–99)
Potassium: 4.1 mEq/L (ref 3.5–5.1)
Sodium: 136 mEq/L (ref 135–145)
Total Bilirubin: 0.4 mg/dL (ref 0.2–1.2)
Total Protein: 6.7 g/dL (ref 6.0–8.3)

## 2018-11-08 LAB — LIPID PANEL
Cholesterol: 182 mg/dL (ref 0–200)
HDL: 73.9 mg/dL (ref >39.00)
LDL CALC: 93 mg/dL (ref 0–99)
NonHDL: 108.43
TRIGLYCERIDES: 77 mg/dL (ref 0.0–149.0)
Total CHOL/HDL Ratio: 2
VLDL: 15.4 mg/dL (ref 0.0–40.0)

## 2018-11-08 LAB — TSH: TSH: 2.54 u[IU]/mL (ref 0.35–4.50)

## 2018-11-08 LAB — VITAMIN B12: Vitamin B-12: 1110 pg/mL — ABNORMAL HIGH (ref 211–911)

## 2018-11-08 NOTE — Patient Instructions (Signed)
Preventive Care 42 Years and Older, Female Preventive care refers to lifestyle choices and visits with your health care provider that can promote health and wellness. What does preventive care include?  A yearly physical exam. This is also called an annual well check.  Dental exams once or twice a year.  Routine eye exams. Ask your health care provider how often you should have your eyes checked.  Personal lifestyle choices, including: ? Daily care of your teeth and gums. ? Regular physical activity. ? Eating a healthy diet. ? Avoiding tobacco and drug use. ? Limiting alcohol use. ? Practicing safe sex. ? Taking low-dose aspirin every day. ? Taking vitamin and mineral supplements as recommended by your health care provider. What happens during an annual well check? The services and screenings done by your health care provider during your annual well check will depend on your age, overall health, lifestyle risk factors, and family history of disease. Counseling Your health care provider may ask you questions about your:  Alcohol use.  Tobacco use.  Drug use.  Emotional well-being.  Home and relationship well-being.  Sexual activity.  Eating habits.  History of falls.  Memory and ability to understand (cognition).  Work and work Statistician.  Reproductive health.  Screening You may have the following tests or measurements:  Height, weight, and BMI.  Blood pressure.  Lipid and cholesterol levels. These may be checked every 5 years, or more frequently if you are over 30 years old.  Skin check.  Lung cancer screening. You may have this screening every year starting at age 27 if you have a 30-pack-year history of smoking and currently smoke or have quit within the past 15 years.  Colorectal cancer screening. All adults should have this screening starting at age 33 and continuing until age 46. You will have tests every 1-10 years, depending on your results and the  type of screening test. People at increased risk should start screening at an earlier age. Screening tests may include: ? Guaiac-based fecal occult blood testing. ? Fecal immunochemical test (FIT). ? Stool DNA test. ? Virtual colonoscopy. ? Sigmoidoscopy. During this test, a flexible tube with a tiny camera (sigmoidoscope) is used to examine your rectum and lower colon. The sigmoidoscope is inserted through your anus into your rectum and lower colon. ? Colonoscopy. During this test, a long, thin, flexible tube with a tiny camera (colonoscope) is used to examine your entire colon and rectum.  Hepatitis C blood test.  Hepatitis B blood test.  Sexually transmitted disease (STD) testing.  Diabetes screening. This is done by checking your blood sugar (glucose) after you have not eaten for a while (fasting). You may have this done every 1-3 years.  Bone density scan. This is done to screen for osteoporosis. You may have this done starting at age 37.  Mammogram. This may be done every 1-2 years. Talk to your health care provider about how often you should have regular mammograms. Talk with your health care provider about your test results, treatment options, and if necessary, the need for more tests. Vaccines Your health care provider may recommend certain vaccines, such as:  Influenza vaccine. This is recommended every year.  Tetanus, diphtheria, and acellular pertussis (Tdap, Td) vaccine. You may need a Td booster every 10 years.  Varicella vaccine. You may need this if you have not been vaccinated.  Zoster vaccine. You may need this after age 38.  Measles, mumps, and rubella (MMR) vaccine. You may need at least  one dose of MMR if you were born in 1957 or later. You may also need a second dose.  Pneumococcal 13-valent conjugate (PCV13) vaccine. One dose is recommended after age 24.  Pneumococcal polysaccharide (PPSV23) vaccine. One dose is recommended after age 24.  Meningococcal  vaccine. You may need this if you have certain conditions.  Hepatitis A vaccine. You may need this if you have certain conditions or if you travel or work in places where you may be exposed to hepatitis A.  Hepatitis B vaccine. You may need this if you have certain conditions or if you travel or work in places where you may be exposed to hepatitis B.  Haemophilus influenzae type b (Hib) vaccine. You may need this if you have certain conditions. Talk to your health care provider about which screenings and vaccines you need and how often you need them. This information is not intended to replace advice given to you by your health care provider. Make sure you discuss any questions you have with your health care provider. Document Released: 09/07/2015 Document Revised: 10/01/2017 Document Reviewed: 06/12/2015 Elsevier Interactive Patient Education  2019 [employer].

## 2018-11-08 NOTE — Progress Notes (Signed)
Subjective:     [name] is a 85 y.o. female and is here for a comprehensive physical exam. The patient reports problems - weakness--- pt is not drinking enough liquids per her daughter   .  Social History   Socioeconomic History  . Marital status: Single    Spouse name: Not on file  . Number of children: 2  . Years of education: Not on file  . Highest education level: Not on file  Occupational History  . Occupation: Retired    Fish farm manager: RETIRED  Social Needs  . Financial resource strain: Not on file  . Food insecurity:    Worry: Not on file    Inability: Not on file  . Transportation needs:    Medical: Not on file    Non-medical: Not on file  Tobacco Use  . Smoking status: Former Research scientist (life sciences)  . Smokeless tobacco: Never Used  Substance and Sexual Activity  . Alcohol use: No  . Drug use: No  . Sexual activity: Not Currently  Lifestyle  . Physical activity:    Days per week: Not on file    Minutes per session: Not on file  . Stress: Not on file  Relationships  . Social connections:    Talks on phone: Not on file    Gets together: Not on file    Attends religious service: Not on file    Active member of club or organization: Not on file    Attends meetings of clubs or organizations: Not on file    Relationship status: Not on file  . Intimate partner violence:    Fear of current or ex partner: Not on file    Emotionally abused: Not on file    Physically abused: Not on file    Forced sexual activity: Not on file  Other Topics Concern  . Not on file  Social History Narrative   REG EXERCISE   WIDOW   LIVES ALONE   END OF LIFE:PATIENT HAS LIVING WILL AND CLEARLY STATES SHE DOES NOT WANT CARDIAC [place] VENTILATION OR OTHER HEROIC OR FUTILE MEASURES.   Health Maintenance  Topic Date Due  . TETANUS/TDAP  06/08/2026  . INFLUENZA VACCINE  Completed  . DEXA SCAN  Completed  . PNA vac Low Risk Adult  Completed    The following portions of the  patient's history were reviewed and updated as appropriate: She  has a past medical history of Allergy, Anemia, Anxiety, Cataract (05/2011), Chronic low blood pressure, Depression, Esophageal disorder, Esophageal yeast infection ([place]), GERD (gastroesophageal reflux disease), Hearing loss, Hyperlipidemia, Hypothyroidism, Myocardial infarction ([place]), Osteoporosis, Renal cell carcinoma ([place]), Thyroid disease, and Transfusion history. She does not have any pertinent problems on file. She  has a past surgical history that includes obstructed ureter (Left); Total abdominal hysterectomy w/ bilateral salpingoophorectomy; Kyphosis surgery; Tonsillectomy; Appendectomy; Colonoscopy; Coronary artery bypass graft; Renal cryoablation (Right, 09/2014); ir generic historical (08/29/2014); ir generic historical (10/22/2016); IR Radiologist Eval & Mgmt (11/03/2017); Blepharoplasty (Right); EUS (N/A, 02/04/2018); and Fine needle aspiration (N/A, 02/04/2018). Her family history includes Breast cancer in her sister and another family member; Heart disease in an other family member; Hyperlipidemia in an other family member; Hypertension in an other family member; Parkinsonism in her father. She  reports that she has quit smoking. She has never used smokeless tobacco. She reports that she does not drink alcohol or use drugs. She has a current medication list which includes the following prescription(s): alprazolam, aspirin, vitamin d3, ferrous fumarate, fluconazole,  fluticasone, folic acid, furosemide, levofloxacin, levothyroxine, loratadine, multivitamin with minerals, potassium chloride sa, promethazine, ranitidine, ropinirole, simvastatin, tizanidine, and tramadol. Current Outpatient Medications on File Prior to Visit  Medication Sig Dispense Refill  . ALPRAZolam (XANAX) 0.5 MG tablet TAKE 1 TABLET BY MOUTH  TWICE A DAY AS NEEDED FOR  ANXIETY 180 tablet 0  . aspirin 81 MG EC tablet Take 81 mg by mouth daily.      . Cholecalciferol  (VITAMIN D3) 1000 UNITS CAPS Take 1,000 Units by mouth daily.     . ferrous fumarate (FERRETTS) 325 (106 FE) MG TABS Take 1 tablet by mouth daily.     . fluconazole (DIFLUCAN) 100 MG tablet TAKE 1 TABLET BY MOUTH  DAILY AS NEEDED FOR YEAST  INFECTION 90 tablet 0  . fluticasone (FLONASE) 50 MCG/ACT nasal spray Place 2 sprays into both nostrils daily as needed for allergies or rhinitis.    . folic acid (FOLVITE) 1 MG tablet Take 1 mg by mouth daily.      . furosemide (LASIX) 40 MG tablet TAKE 1 TABLET BY MOUTH  DAILY AS NEEDED 90 tablet 1  . levofloxacin (LEVAQUIN) 500 MG tablet Take 1 tablet (500 mg total) by mouth daily. 7 tablet 0  . levothyroxine (SYNTHROID, LEVOTHROID) 50 MCG tablet TAKE 1 TABLET BY MOUTH  DAILY 90 tablet 1  . loratadine (CLARITIN) 10 MG tablet Take 10 mg by mouth daily.    . Multiple Vitamin (MULTIVITAMIN WITH MINERALS) TABS tablet Take 1 tablet by mouth daily.    . potassium chloride SA (K-DUR,KLOR-CON) 20 MEQ tablet TAKE 1 TABLET BY MOUTH TWO  TIMES DAILY 180 tablet 1  . promethazine (PHENERGAN) 25 MG tablet TAKE 1 TABLET BY MOUTH TWO  TIMES DAILY 180 tablet 1  . ranitidine (ZANTAC) 150 MG tablet Take 150 mg by mouth daily.     Marland Kitchen rOPINIRole (REQUIP) 1 MG tablet TAKE 1 TABLET BY MOUTH AT  BEDTIME 90 tablet 1  . simvastatin (ZOCOR) 20 MG tablet TAKE 1 TABLET BY MOUTH  DAILY 90 tablet 1  . tiZANidine (ZANAFLEX) 2 MG tablet Take 2 mg by mouth daily.     . traMADol (ULTRAM) 50 MG tablet TAKE 1 TABLET BY MOUTH  EVERY 6 HOURS AS NEEDED FOR PAIN 120 tablet 0   No current facility-administered medications on file prior to visit.    She is allergic to zofran [ondansetron hcl]; clarithromycin; codeine; diazepam; sulfonamide derivatives; prilosec [omeprazole magnesium]; and trazodone..  Review of Systems Review of Systems  Constitutional: Negative for activity change, appetite change and fatigue.  HENT: Negative for hearing loss, congestion, tinnitus and ear discharge.  dentist  q75m Eyes: Negative for visual disturbance  Respiratory: Negative for cough, chest tightness and shortness of breath.   Cardiovascular: Negative for chest pain, palpitations and leg swelling.  Gastrointestinal: Negative for abdominal pain, diarrhea, constipation and abdominal distention.  Genitourinary: Negative for urgency, frequency, decreased urine volume and difficulty urinating.  Musculoskeletal: Negative for back pain, arthralgias and gait problem.  Skin: Negative for color change, pallor and rash.  Neurological: Negative for dizziness, light-headedness, numbness and headaches.  Hematological: Negative for adenopathy. Does not bruise/bleed easily.  Psychiatric/Behavioral: Negative for suicidal ideas, confusion, sleep disturbance, self-injury, dysphoric mood, decreased concentration and agitation.      Objective:    BP 96/62 (BP Location: Right Arm, Cuff Size: Normal)   Pulse 76   Temp 97.6 F (36.4 C) (Oral)   Resp 12   Ht 5\' 3"  (1.6 m)  Wt 86 lb (39 kg)   SpO2 94%   BMI 15.23 kg/m  General appearance: alert, cooperative, appears stated age and no distress Head: Normocephalic, without obvious abnormality, atraumatic Eyes: conjunctivae/corneas clear. PERRL, EOM's intact. Fundi benign. Ears: normal TM's and external ear canals both ears Nose: Nares normal. Septum midline. Mucosa normal. No drainage or sinus tenderness. Throat: lips, mucosa, and tongue normal; teeth and gums normal Neck: no adenopathy, no carotid bruit, no JVD, supple, symmetrical, trachea midline and thyroid not enlarged, symmetric, no tenderness/mass/nodules Back: symmetric, no curvature. ROM normal. No CVA tenderness. Lungs: clear to auscultation bilaterally Breasts: normal appearance, no masses or tenderness Heart: regular rate and rhythm, S1, S2 normal, no murmur, click, rub or gallop Abdomen: soft, non-tender; bowel sounds normal; no masses,  no organomegaly Pelvic: not indicated; status post  hysterectomy, negative ROS Extremities: extremities normal, atraumatic, no cyanosis or edema Pulses: 2+ and symmetric Skin: Skin color, texture, turgor normal. No rashes or lesions Lymph nodes: Cervical, supraclavicular, and axillary nodes normal. Neurologic: Alert and oriented X 3, normal strength and tone. Normal symmetric reflexes. Normal coordination and gait    Assessment:    Healthy female exam.      Plan:    ghm utd Check labs  Discussed shingrix with daughter and pt See After Visit Summary for Counseling Recommendations    1. Hyperlipidemia LDL goal <100 Tolerating statin, encouraged heart healthy diet, avoid trans fats, minimize simple carbs and saturated fats. Increase exercise as tolerated - Lipid panel - Comprehensive metabolic panel  2. Weakness Encouraged pt to drink more fluids   3. Hypothyroidism, unspecified type Check labs  - TSH - CBC with Differential/Platelet - Comprehensive metabolic panel - Vitamin O70

## 2018-11-08 NOTE — Assessment & Plan Note (Signed)
.  sblipid 

## 2018-11-11 ENCOUNTER — Telehealth: Payer: Self-pay

## 2018-11-11 NOTE — Telephone Encounter (Signed)
Called and spoke with patients daughter about lab results. States they have been increasing patients fluids as discussed.

## 2018-11-25 ENCOUNTER — Inpatient Hospital Stay (HOSPITAL_COMMUNITY): Admission: RE | Admit: 2018-11-25 | Payer: Self-pay | Source: Ambulatory Visit

## 2018-11-25 ENCOUNTER — Other Ambulatory Visit: Payer: Self-pay

## 2018-12-08 ENCOUNTER — Other Ambulatory Visit: Payer: Self-pay | Admitting: Family Medicine

## 2018-12-08 DIAGNOSIS — M159 Polyosteoarthritis, unspecified: Secondary | ICD-10-CM

## 2018-12-10 NOTE — Telephone Encounter (Signed)
Last written: 10/11/18 Last ov: 11/08/18 Next ov: 05/12/19 Contract: due UDS: due will get at next visit

## 2019-01-13 ENCOUNTER — Ambulatory Visit: Payer: Self-pay | Admitting: Family Medicine

## 2019-02-03 ENCOUNTER — Other Ambulatory Visit: Payer: Self-pay | Admitting: *Deleted

## 2019-02-03 ENCOUNTER — Telehealth: Payer: Self-pay | Admitting: Gastroenterology

## 2019-02-03 DIAGNOSIS — N2889 Other specified disorders of kidney and ureter: Secondary | ICD-10-CM

## 2019-02-03 NOTE — Telephone Encounter (Signed)
Spoke to the patient daughter who reports her mother has had increasing difficulty specifically swallowing medications. The patient is on a soft diet because of dental issues and reportedly does not have trouble swallowing food and liquids. The daughter reports the pills get stuck to the point the patient is "gagging, choking and can't breathe" and eventually vomits. This causes extreme fear and distress. The daughter reports the mother states "there is a certain point in my throat the pills get stuck every time." This has has been happening approx one month every 2 or 3 days.   The daughter also reports the patient has been having abd pain "on and off for years" but last night, the pain reached an excruciating, debilitating level. The patient took left over tramadol for the pain that did not bring relief then took a "narcotic" left over from a previous prescription for wound care management which helped the pain. The pain seems not to be associated with anything the patient can point to, it's reported to be "random." No diarrhea but states extreme nausea during these episodes with intermittently vomiting "bile." The daughter reported the mother has annual CT's and has one coming up on 6/16 (Dr. Bethel Born) and if she is going to be seen, would prefer to be seen post CT so that Dr. Ardis Hughs has the results that could possibly help direct a plan of care.   Please advise.

## 2019-02-03 NOTE — Telephone Encounter (Signed)
Patient daughter called in on behalf of the patient she is requesting that the patient come in to an office visit for follow up patient is had trouble swallowing. Please call and advise thanks

## 2019-02-04 NOTE — Telephone Encounter (Signed)
Ok, thanks  Please offer her an in office appt sometime next week (after the CT) or the week after.  Radonna Ricker

## 2019-02-04 NOTE — Telephone Encounter (Signed)
Spoke to daughter, in person in office visit scheduled on Friday 02/18/2019 at 8:30 am post CT abd/pelvis 02/08/2019. Nothing further at the time of the phone call.

## 2019-02-08 ENCOUNTER — Ambulatory Visit (HOSPITAL_COMMUNITY)
Admission: RE | Admit: 2019-02-08 | Discharge: 2019-02-08 | Disposition: A | Discharge status: Home / Self Care | Payer: MEDICARE | Source: Ambulatory Visit | Attending: Interventional Radiology | Admitting: Interventional Radiology

## 2019-02-08 ENCOUNTER — Other Ambulatory Visit: Payer: Self-pay

## 2019-02-08 ENCOUNTER — Encounter (HOSPITAL_COMMUNITY): Payer: Self-pay

## 2019-02-08 DIAGNOSIS — N2889 Other specified disorders of kidney and ureter: Secondary | ICD-10-CM | POA: Insufficient documentation

## 2019-02-08 LAB — POCT I-STAT CREATININE: Creatinine, Ser: 1 mg/dL (ref 0.44–1.00)

## 2019-02-08 MED ORDER — SODIUM CHLORIDE (PF) 0.9 % IJ SOLN
INTRAMUSCULAR | Status: AC
  Filled 2019-02-08: qty 50

## 2019-02-08 MED ORDER — IOHEXOL 300 MG/ML  SOLN
75.0000 mL | Freq: Once | INTRAMUSCULAR | Status: AC | PRN
  Administered 2019-02-08: 60 mL via INTRAVENOUS

## 2019-02-11 ENCOUNTER — Ambulatory Visit: Payer: Self-pay | Admitting: *Deleted

## 2019-02-15 ENCOUNTER — Encounter: Payer: Self-pay | Admitting: *Deleted

## 2019-02-15 ENCOUNTER — Ambulatory Visit
Admission: RE | Admit: 2019-02-15 | Discharge: 2019-02-15 | Disposition: A | Discharge status: Home / Self Care | Payer: MEDICARE | Source: Ambulatory Visit | Attending: Interventional Radiology | Admitting: Interventional Radiology

## 2019-02-15 DIAGNOSIS — N2889 Other specified disorders of kidney and ureter: Secondary | ICD-10-CM | POA: Diagnosis not present

## 2019-02-15 DIAGNOSIS — Z9889 Other specified postprocedural states: Secondary | ICD-10-CM | POA: Diagnosis not present

## 2019-02-15 HISTORY — PX: IR RADIOLOGIST EVAL & MGMT: IMG5224

## 2019-02-15 NOTE — Progress Notes (Signed)
Chief Complaint: Follow up Renal Cryoablation  Referring Physician(s): Yamagata,Glenn  Supervising Physician: Aletta Edouard  History of Present Illness: [name] is a 85 y.o. female who underwent a right renal mass cryoablation 10/06/2014 by Dr. Kathlene Cote.  She has done well.  She only c/o some abdominal pain which comes and goes.  She has a known pancreatic cyst and will be seeing Dr. Ardis Hughs next week.  CT scan done 02/08/19 showed stable cryoablation site in the mid RIGHT kidney without evidence local recurrence.  Past Medical History:  Diagnosis Date   Allergy    Anemia    Anxiety    Cataract 05/2011   Chronic low blood pressure    due to dehydration   Depression    Esophageal disorder    Esophageal yeast infection ([facility])    GERD (gastroesophageal reflux disease)    Hearing loss    hearing aids   Hyperlipidemia    Hypothyroidism    Myocardial infarction [facility])    Osteoporosis    Renal cell carcinoma ([place])    found on CT   Thyroid disease    hypothyroidism   Transfusion history    last 2004    Past Surgical History:  Procedure Laterality Date   APPENDECTOMY     BLEPHAROPLASTY Right    x 3 (all related to basal cell carcinoma)   COLONOSCOPY     CORONARY ARTERY BYPASS GRAFT     x2 vessel bypass -Dr. Burt Knack -[place] LOV 06-22-14   EUS N/A 02/04/2018   Procedure: UPPER ENDOSCOPIC ULTRASOUND (EUS) RADIAL;  Surgeon: Milus Banister, MD;  Location: [facility] ENDOSCOPY;  Service: Endoscopy;  Laterality: N/A;   FINE NEEDLE ASPIRATION N/A 02/04/2018   Procedure: FINE NEEDLE ASPIRATION (FNA) LINEAR;  Surgeon: Milus Banister, MD;  Location: [facility] ENDOSCOPY;  Service: Endoscopy;  Laterality: N/A;   IR GENERIC HISTORICAL  08/29/2014   IR RADIOLOGIST EVAL & MGMT 08/29/2014 Aletta Edouard, MD [facility] INTERV RAD   IR GENERIC HISTORICAL  10/22/2016   IR RADIOLOGIST EVAL & MGMT 10/22/2016 Aletta Edouard, MD [facility] INTERV RAD   IR RADIOLOGIST EVAL &  MGMT  11/03/2017   KYPHOSIS SURGERY     obstructed ureter Left    repair-operative   RENAL CRYOABLATION Right 09/2014   TONSILLECTOMY     TOTAL ABDOMINAL HYSTERECTOMY W/ BILATERAL SALPINGOOPHORECTOMY      Allergies: Zofran [ondansetron hcl], Clarithromycin, Codeine, Diazepam, Sulfonamide derivatives, Prilosec [omeprazole magnesium], and Trazodone  Medications: Prior to Admission medications   Medication Sig Start Date End Date Taking? Authorizing Provider  ALPRAZolam Duanne Moron) 0.5 MG tablet TAKE 1 TABLET BY MOUTH  TWICE A DAY AS NEEDED FOR  ANXIETY 10/11/18   Carollee Herter, Alferd Apa, DO  aspirin 81 MG EC tablet Take 81 mg by mouth daily.      [provider]  Cholecalciferol (VITAMIN D3) 1000 UNITS CAPS Take 1,000 Units by mouth daily.     [provider]  ferrous fumarate (FERRETTS) 325 (106 FE) MG TABS Take 1 tablet by mouth daily.     [provider]  fluconazole (DIFLUCAN) 100 MG tablet TAKE 1 TABLET BY MOUTH  DAILY AS NEEDED FOR YEAST  INFECTION 12/10/18   Carollee Herter, Alferd Apa, DO  fluticasone (FLONASE) 50 MCG/ACT nasal spray Place 2 sprays into both nostrils daily as needed for allergies or rhinitis.    [provider]  folic acid (FOLVITE) 1 MG tablet Take 1 mg by mouth daily.  [provider]  furosemide (LASIX) 40 MG tablet TAKE 1 TABLET BY MOUTH  DAILY AS NEEDED 10/11/18   Carollee Herter, Alferd Apa, DO  levofloxacin (LEVAQUIN) 500 MG tablet Take 1 tablet (500 mg total) by mouth daily. 07/26/18   Ann Held, DO  levothyroxine (SYNTHROID, LEVOTHROID) 50 MCG tablet TAKE 1 TABLET BY MOUTH  DAILY 10/11/18   Carollee Herter, Alferd Apa, DO  loratadine (CLARITIN) 10 MG tablet Take 10 mg by mouth daily.    [provider]  Multiple Vitamin (MULTIVITAMIN WITH MINERALS) TABS tablet Take 1 tablet by mouth daily.    [provider]  potassium chloride SA (K-DUR,KLOR-CON) 20 MEQ tablet TAKE 1 TABLET BY MOUTH TWO  TIMES DAILY  10/11/18   Carollee Herter, Alferd Apa, DO  promethazine (PHENERGAN) 25 MG tablet TAKE 1 TABLET BY MOUTH TWO  TIMES DAILY 10/11/18   Carollee Herter, Alferd Apa, DO  ranitidine (ZANTAC) 150 MG tablet Take 150 mg by mouth daily.     [provider]  rOPINIRole (REQUIP) 1 MG tablet TAKE 1 TABLET BY MOUTH AT  BEDTIME 10/11/18   Carollee Herter, Yvonne R, DO  simvastatin (ZOCOR) 20 MG tablet TAKE 1 TABLET BY MOUTH  DAILY 10/11/18   Carollee Herter, Alferd Apa, DO  tiZANidine (ZANAFLEX) 2 MG tablet Take 2 mg by mouth daily.     [provider]  traMADol (ULTRAM) 50 MG tablet TAKE 1 TABLET BY MOUTH  EVERY 6 HOURS AS NEEDED FOR PAIN 12/10/18   Ann Held, DO     Family History  Problem Relation Age of Onset   Parkinsonism Father    Hypertension Other    Heart disease Other        CAD.Marland Kitchen1ST DEGREE RELATIVE <60   Hyperlipidemia Other    Breast cancer Sister    Breast cancer Other    Colon cancer Neg Hx    Esophageal cancer Neg Hx    Rectal cancer Neg Hx    Stomach cancer Neg Hx     Social History   Socioeconomic History   Marital status: Single    Spouse name: Not on file   Number of children: 2   Years of education: Not on file   Highest education level: Not on file  Occupational History   Occupation: Retired    Fish farm manager: RETIRED  Scientist, product/process development strain: Not on file   Food insecurity    Worry: Not on file    Inability: Not on file   Transportation needs    Medical: Not on file    Non-medical: Not on file  Tobacco Use   Smoking status: Former Smoker   Smokeless tobacco: Never Used  Substance and Sexual Activity   Alcohol use: No   Drug use: No   Sexual activity: Not Currently  Lifestyle   Physical activity    Days per week: Not on file    Minutes per session: Not on file   Stress: Not on file  Relationships   Social connections    Talks on phone: Not on file    Gets together: Not on file    Attends religious service: Not  on file    Active member of club or organization: Not on file    Attends meetings of clubs or organizations: Not on file    Relationship status: Not on file  Other Topics Concern   Not on file  Social History Narrative   REG EXERCISE  WIDOW   LIVES ALONE   END OF LIFE:PATIENT HAS LIVING WILL AND CLEARLY STATES SHE DOES NOT WANT CARDIAC [place] VENTILATION OR OTHER HEROIC OR FUTILE MEASURES.     Review of Systems: A 12 point ROS discussed and pertinent positives are indicated in the HPI above.  All other systems are negative.  Review of Systems  Vital Signs: BP (!) 119/57 (BP Location: Right Arm)    Pulse 76    Temp 97.7 F (36.5 C)    SpO2 97%   Physical Exam Vitals signs reviewed.  Constitutional:      Appearance: Normal appearance.  HENT:     Head: Normocephalic and atraumatic.  Eyes:     Extraocular Movements: Extraocular movements intact.  Neck:     Musculoskeletal: Normal range of motion.  Cardiovascular:     Rate and Rhythm: Normal rate and regular rhythm.  Pulmonary:     Effort: Pulmonary effort is normal.     Breath sounds: Normal breath sounds.  Abdominal:     General: There is no distension.     Palpations: Abdomen is soft.     Tenderness: There is no abdominal tenderness.  Musculoskeletal: Normal range of motion.  Skin:    General: Skin is warm and dry.  Neurological:     General: No focal deficit present.     Mental Status: She is alert and oriented to person, place, and time.  Psychiatric:        Mood and Affect: Mood normal.        Behavior: Behavior normal.        Thought Content: Thought content normal.        Judgment: Judgment normal.     Imaging: Ct Abdomen W Wo Contrast  Addendum Date: 02/08/2019   ADDENDUM REPORT: 02/08/2019 11:52 ADDENDUM: Format Error: Replaces pancreatic findings Pancreas: Bilobed cystic lesion in the pancreas measures 25 by 14 mm compared to 18 by 15 mm on prior. Previously the lesion was  unilocular. There is increased prominence of the distal pancreatic duct measuring 4 mm (image 47/6) compared to 2 to 3 mm. Prominence of the duct in the body and tail are similar prior. No clear communication between the cystic complex and the duct. Electronically Signed   By: Suzy Bouchard M.D.   On: 02/08/2019 11:52   Result Date: 02/08/2019 CLINICAL DATA:  Follow-up cryoablation of RIGHT renal mass. Pancreatic cystic mass. EXAM: CT ABDOMEN WITHOUT AND WITH CONTRAST TECHNIQUE: Multidetector CT imaging of the abdomen was performed following the standard protocol before and following the bolus administration of intravenous contrast. CONTRAST:  68mL OMNIPAQUE IOHEXOL 300 MG/ML  SOLN COMPARISON:  CT 11/03/2017 FINDINGS: Lower chest:  Lung bases are clear. Hepatobiliary: No enhancing hepatic lesion. Small gallstone noted a bile duct upper limits normal at 6 mm. Bilobed cystic lesion in the pancreas measures 25 by 14 mm compared to 18 by 15 mm on prior. Previously the lesion was unilocular. There is increased prominence of the distal pancreatic duct measuring 4 mm (image 47/6) compared to 2 to 3 mm. Prominence of the duct in the body and tail are similar prior. No clear communication between the cystic complex and the duct. Pancreas: Normal pancreatic parenchymal intensity. No ductal dilatation or inflammation. Spleen: Normal spleen. Adrenals/urinary tract: Adrenal glands normal. Ablation defect in the mid RIGHT renal cortex again noted. Small soft tissue component previous described measuring 12 mm by 6 mm on image 47/6 is unchanged from 11 mm by 6 mm. No  new enhancing renal lesion in the RIGHT kidney. Calculi within the LEFT kidney. Cortical thinning LEFT kidney. Nonenhancing cysts Stomach/Bowel: Stomach and limited of the small bowel is unremarkable Vascular/Lymphatic: Abdominal aortic normal caliber. No retroperitoneal periportal lymphadenopathy. Musculoskeletal: No aggressive osseous lead IMPRESSION: 1.  Continued interval increase in size of cystic complex in the head of the pancreas concerning cystic pancreatic neoplasm. IPMT is a possibility with prominence of the distal pancreatic duct although no direct communication identified. 2. Stable cryoablation site in the mid RIGHT kidney without evidence local recurrence. Electronically Signed: By: Suzy Bouchard M.D. On: 02/08/2019 11:37    Labs:  CBC: Recent Labs    07/07/18 0936 11/08/18 0910  WBC 6.9 7.3  HGB 12.8 14.3  HCT 40.2 43.0  PLT 230 280.0    COAGS: No results for input(s): INR, APTT in the last 8760 hours.  BMP: Recent Labs    07/07/18 0936 07/15/18 0947 09/27/18 0734 11/08/18 0910 02/08/19 0800  NA 136 137 139 136  --   K 4.3 4.7 3.8 4.1  --   CL 101 101 102 102  --   CO2 24 25 26 23   --   GLUCOSE 98 90 95 97  --   BUN 14 20 12  24*  --   CALCIUM 9.7 9.7 9.6 9.6  --   CREATININE 0.98 1.51* 1.03 1.57* 1.00  GFRNONAA 50*  --   --   --   --   GFRAA 58*  --   --   --   --     LIVER FUNCTION TESTS: Recent Labs    07/07/18 0936 07/15/18 0947 09/27/18 0734 11/08/18 0910  BILITOT 0.6 0.3 0.4 0.4  AST 28 42* 33 28  ALT 22 28 42* 25  ALKPHOS 76 74 68 75  PROT 6.7 6.1 6.0 6.7  ALBUMIN 3.6 3.7 3.8 4.2    TUMOR MARKERS: No results for input(s): AFPTM, CEA, CA199, CHROMGRNA in the last 8760 hours.  Assessment/Plan:  S/P cryoablation of right renal mass back on 10/06/2014 by Dr. Kathlene Cote.  CT showed stable cryoablation site in the mid RIGHT kidney without evidence local recurrence.  Patient also seen by Dr. Kathlene Cote today.  Ok to discontinue follow up CT for the renal mass.  Care of Pancreatic cyst per Dr. Ardis Hughs.  Electronically Signed: Murrell Redden PA-C 02/15/2019, 8:29 AM    Please refer to Dr. Margaretmary Dys attestation of this note for management and plan.

## 2019-02-16 ENCOUNTER — Telehealth: Payer: Self-pay | Admitting: Gastroenterology

## 2019-02-16 ENCOUNTER — Telehealth: Payer: Self-pay

## 2019-02-16 NOTE — Telephone Encounter (Signed)
Covid-19 screening questions   Do you now or have you had a fever in the last 14 days no   Do you have any respiratory symptoms of shortness of breath or cough now or in the last 14 days no  Do you have any family members or close contacts with diagnosed or suspected Covid-19 in the past 14 days no  Have you been tested for Covid-19 and found to be positive no          

## 2019-02-18 ENCOUNTER — Ambulatory Visit (INDEPENDENT_AMBULATORY_CARE_PROVIDER_SITE_OTHER): Payer: MEDICARE | Admitting: Gastroenterology

## 2019-02-18 ENCOUNTER — Encounter: Payer: Self-pay | Admitting: Gastroenterology

## 2019-02-18 VITALS — BP 120/60 | HR 65 | Temp 97.7°F | Ht 63.0 in | Wt 88.8 lb

## 2019-02-18 DIAGNOSIS — R1032 Left lower quadrant pain: Secondary | ICD-10-CM

## 2019-02-18 DIAGNOSIS — R1310 Dysphagia, unspecified: Secondary | ICD-10-CM

## 2019-02-18 DIAGNOSIS — K8689 Other specified diseases of pancreas: Secondary | ICD-10-CM | POA: Diagnosis not present

## 2019-02-18 NOTE — Patient Instructions (Signed)
Continue your Diflucan for 10 more days  Please call our office is your swallowing not improving  Call our office if and when you start to feel left sided abdominal pain  Thank you for entrusting me with your care and choosing [facility].  Dr Ardis Hughs

## 2019-02-18 NOTE — Progress Notes (Signed)
Review of pertinent gastrointestinal problems:  1. Adenomatous polyp: Colonoscopy January 2012 (age 85) done for fecal occult positive stool, 14 mm adenoma was removed in piecemeal fashion. I felt was completely resected and did not require any further surveillance given her age.  2. GERD:Had allergic (welts, rash) reaction to prilosec ; EGD 12/2013Esophageal yeast infectionMild to moderate pan-gastritis, biopsied to check for H. pylori The examination was otherwise normal; biopsies showed chronic gastritis, no H. Pylori. May, 2014 doing well on twice daily H2 blocker, bedtime Reglan. 02/2013 called with recurrent dysphagia, diflucan restarted empirically. 12/2013 intermittent diflucan treatment helps her swallowing. She started soaking her dentures in nystatin every 7-10 days. EUS 01/2018 found yeast esophagitis again. 3. Incidental pancreatic cyst: No concerning morphologic features based on 2015 AGA criteria. Regardless, EUS 01/2018 found two innocent appearing pancreatic head cysts, one was sampled and fluid testing showed: CEA 640 ng/mL (elevated)  Amylase 286 U/L  Cytology: no sign of cancer. Recommended continued imaging surveillance (which she gets anyway for renal cancer.   HPI: This is a very pleasant 85 year old woman whom I last saw about a year ago.  She is here with her daughter today.  She has recurrent dysphasia.  Especially to pills, especially large pills such as potassium.  Food is also starting to hang and catch as well.  Minor issues with liquids.  She has been reluctant to restart Diflucan for fear that she will build up a "tolerance to it".  She has intermittent left lower quadrant pains that she has difficulty characterizing.  She says it can be severe when they happen.  They are intermittent and "not often".  She cannot tell me how long the pains last and she cannot tell me if moving her bowels during the pain makes any difference.  I think she is starting to have some  difficulties with memory.  We also discussed her incidental pancreatic cyst  Chief complaint is incidental pancreatic cyst, dysphasia, left lower quadrant pain  ROS: complete GI ROS as described in HPI, all other review negative.  Constitutional:  No unintentional weight loss   Past Medical History:  Diagnosis Date  . Allergy   . Anemia   . Anxiety   . Cataract 05/2011  . Chronic low blood pressure    due to dehydration  . Depression   . Esophageal disorder   . Esophageal yeast infection ([place])   . GERD (gastroesophageal reflux disease)   . Hearing loss    hearing aids  . Hyperlipidemia   . Hypothyroidism   . Myocardial infarction ([place])   . Osteoporosis   . Renal cell carcinoma ([place])    found on CT  . Thyroid disease    hypothyroidism  . Transfusion history    last 2004    Past Surgical History:  Procedure Laterality Date  . APPENDECTOMY    . BLEPHAROPLASTY Right    x 3 (all related to basal cell carcinoma)  . COLONOSCOPY    . CORONARY ARTERY BYPASS GRAFT     x2 vessel bypass -Dr. Burt Knack -[place] [facility] 06-22-14  . EUS N/A 02/04/2018   Procedure: UPPER ENDOSCOPIC ULTRASOUND (EUS) RADIAL;  Surgeon: Milus Banister, MD;  Location: [facility];  Service: Endoscopy;  Laterality: N/A;  . FINE NEEDLE ASPIRATION N/A 02/04/2018   Procedure: FINE NEEDLE ASPIRATION (FNA) LINEAR;  Surgeon: Milus Banister, MD;  Location: [facility];  Service: Endoscopy;  Laterality: N/A;  . IR GENERIC HISTORICAL  08/29/2014   IR RADIOLOGIST EVAL &  MGMT 08/29/2014 Aletta Edouard, MD [facility] INTERV RAD  . IR GENERIC HISTORICAL  10/22/2016   IR RADIOLOGIST EVAL & MGMT 10/22/2016 Aletta Edouard, MD [facility] INTERV RAD  . IR RADIOLOGIST EVAL & MGMT  11/03/2017  . IR RADIOLOGIST EVAL & MGMT  02/15/2019  . KYPHOSIS SURGERY    . obstructed ureter Left    repair-operative  . RENAL CRYOABLATION Right 09/2014  . TONSILLECTOMY    . TOTAL ABDOMINAL HYSTERECTOMY W/ BILATERAL SALPINGOOPHORECTOMY      Current  Outpatient Medications  Medication Sig Dispense Refill  . ALPRAZolam (XANAX) 0.5 MG tablet TAKE 1 TABLET BY MOUTH  TWICE A DAY AS NEEDED FOR  ANXIETY 180 tablet 0  . aspirin 81 MG EC tablet Take 81 mg by mouth daily.      . Cholecalciferol (VITAMIN D3) 1000 UNITS CAPS Take 1,000 Units by mouth daily.     . ferrous fumarate (FERRETTS) 325 (106 FE) MG TABS Take 1 tablet by mouth daily.     . fluconazole (DIFLUCAN) 100 MG tablet TAKE 1 TABLET BY MOUTH  DAILY AS NEEDED FOR YEAST  INFECTION 90 tablet 0  . fluticasone (FLONASE) 50 MCG/ACT nasal spray Place 2 sprays into both nostrils daily as needed for allergies or rhinitis.    . folic acid (FOLVITE) 1 MG tablet Take 1 mg by mouth daily.      . furosemide (LASIX) 40 MG tablet TAKE 1 TABLET BY MOUTH  DAILY AS NEEDED 90 tablet 1  . levofloxacin (LEVAQUIN) 500 MG tablet Take 1 tablet (500 mg total) by mouth daily. 7 tablet 0  . levothyroxine (SYNTHROID, LEVOTHROID) 50 MCG tablet TAKE 1 TABLET BY MOUTH  DAILY 90 tablet 1  . loratadine (CLARITIN) 10 MG tablet Take 10 mg by mouth daily.    . Multiple Vitamin (MULTIVITAMIN WITH MINERALS) TABS tablet Take 1 tablet by mouth daily.    . potassium chloride SA (K-DUR,KLOR-CON) 20 MEQ tablet TAKE 1 TABLET BY MOUTH TWO  TIMES DAILY 180 tablet 1  . promethazine (PHENERGAN) 25 MG tablet TAKE 1 TABLET BY MOUTH TWO  TIMES DAILY 180 tablet 1  . ranitidine (ZANTAC) 150 MG tablet Take 150 mg by mouth daily.     Marland Kitchen rOPINIRole (REQUIP) 1 MG tablet TAKE 1 TABLET BY MOUTH AT  BEDTIME 90 tablet 1  . simvastatin (ZOCOR) 20 MG tablet TAKE 1 TABLET BY MOUTH  DAILY 90 tablet 1  . tiZANidine (ZANAFLEX) 2 MG tablet Take 2 mg by mouth daily.     . traMADol (ULTRAM) 50 MG tablet TAKE 1 TABLET BY MOUTH  EVERY 6 HOURS AS NEEDED FOR PAIN 120 tablet 0   No current facility-administered medications for this visit.     Allergies as of 02/18/2019 - Review Complete 02/18/2019  Allergen Reaction Noted  . Zofran [ondansetron hcl]  Anaphylaxis and Other (See Comments) 07/23/2012  . Clarithromycin    . Codeine    . Diazepam Other (See Comments) 10/11/2012  . Sulfonamide derivatives    . Prilosec [omeprazole magnesium] Rash 05/26/2011  . Trazodone Anxiety 10/11/2012    Family History  Problem Relation Age of Onset  . Parkinsonism Father   . Hypertension Other   . Heart disease Other        CAD.Marland Kitchen1ST DEGREE RELATIVE <60  . Hyperlipidemia Other   . Breast cancer Sister   . Breast cancer Other   . Colon cancer Neg Hx   . Esophageal cancer Neg Hx   . Rectal cancer Neg Hx   .  Stomach cancer Neg Hx     Social History   Socioeconomic History  . Marital status: Single    Spouse name: Not on file  . Number of children: 2  . Years of education: Not on file  . Highest education level: Not on file  Occupational History  . Occupation: Retired    Fish farm manager: RETIRED  Social Needs  . Financial resource strain: Not on file  . Food insecurity    Worry: Not on file    Inability: Not on file  . Transportation needs    Medical: Not on file    Non-medical: Not on file  Tobacco Use  . Smoking status: Former Research scientist (life sciences)  . Smokeless tobacco: Never Used  Substance and Sexual Activity  . Alcohol use: No  . Drug use: No  . Sexual activity: Not Currently  Lifestyle  . Physical activity    Days per week: Not on file    Minutes per session: Not on file  . Stress: Not on file  Relationships  . Social Herbalist on phone: Not on file    Gets together: Not on file    Attends religious service: Not on file    Active member of club or organization: Not on file    Attends meetings of clubs or organizations: Not on file    Relationship status: Not on file  . Intimate partner violence    Fear of current or ex partner: Not on file    Emotionally abused: Not on file    Physically abused: Not on file    Forced sexual activity: Not on file  Other Topics Concern  . Not on file  Social History Narrative   REG  EXERCISE   [place]   LIVES ALONE   END OF LIFE:PATIENT HAS LIVING WILL AND CLEARLY STATES SHE DOES NOT WANT CARDIAC [place] VENTILATION OR OTHER HEROIC OR FUTILE MEASURES.     Physical Exam: BP 120/60   Pulse 65   Temp 97.7 F (36.5 C)   Ht 5\' 3"  (1.6 m)   Wt 88 lb 12.8 oz (40.3 kg)   BMI 15.73 kg/m  Constitutional: Very thin and frail but mentally quite clear. Psychiatric: alert and oriented x3 Abdomen: soft, nontender, nondistended, no obvious ascites, no peritoneal signs, normal bowel sounds No peripheral edema noted in lower extremities  Assessment and plan: 85 y.o. female with incidental pancreas cyst, recurrent dysphasia, left lower quadrant pains  First I suspect her recurrent dysphasia is from recurrent Candida infection.  She has had problems with this many times over the years.  Diflucan always seems to help.  She already has a prescription of Diflucan at home and I recommended that she take a 10-day course.  She knows to call if her swelling does not improve after that 10-day course  We discussed her incidental pancreatic cyst.  Still no concerning morphologic features.  The the cyst fluid CEA was elevated by endoscopic ultrasound about a year ago however there are no concerning morphologic features on recent CT scan.  Given her very advanced age, frailty I recommended no further testing for the cysts unless significant clinical change.  Left lower quadrant pain, intermittent.  "Not often".  CT scan recently was unrevealing.  She really points down to her pelvis.  Perhaps this is ureteral issues.  She tells me she has had this in the past.  I recommended that she call during her next acute episode of left lower quadrant, left groin  pain and at that time we would arrange for same-day blood test and urine tests.   Please see the "Patient Instructions" section for addition details about the plan.  Owens Loffler, MD [facility] 02/18/2019, 8:37  AM

## 2019-02-21 ENCOUNTER — Telehealth: Payer: Self-pay | Admitting: Family Medicine

## 2019-02-21 DIAGNOSIS — M159 Polyosteoarthritis, unspecified: Secondary | ICD-10-CM

## 2019-02-21 DIAGNOSIS — F419 Anxiety disorder, unspecified: Secondary | ICD-10-CM

## 2019-02-21 DIAGNOSIS — R110 Nausea: Secondary | ICD-10-CM

## 2019-02-22 NOTE — Telephone Encounter (Signed)
Requesting: Xanax Contract: 11/05/2017 UDS:11/05/2017, low risk, 02/05/2018 Last OV: 11/08/2018 Next OV: 05/12/2019 Last Refill: 10/11/2018, #180--0 RF Database:   Requesting: Tramadol Contract:  UDS: Last OV: Next OV: Last Refill: 12/10/2018, #120--0 RF Database:   Please advise

## 2019-03-02 ENCOUNTER — Other Ambulatory Visit: Payer: Self-pay | Admitting: Family Medicine

## 2019-03-02 DIAGNOSIS — F419 Anxiety disorder, unspecified: Secondary | ICD-10-CM

## 2019-03-02 MED ORDER — ALPRAZOLAM 0.5 MG PO TABS: 0.5000 mg | ORAL_TABLET | Freq: Two times a day (BID) | ORAL | 0 refills | Status: DC | PRN

## 2019-03-02 NOTE — Telephone Encounter (Signed)
Requesting: Xanax Contract: 11/05/2017 UDS:11/05/2017, low risk, 02/05/2018 Last OV: 11/08/2018 Next OV: 05/12/2019 Last Refill: 10/11/2018, #180--0 RF Database:   Requesting: Tramadol Contract:  UDS: Last OV: Next OV: Last Refill: 12/10/2018, #120--0 RF Database:   ** Appears the Rx failed at the pharmacy**

## 2019-03-02 NOTE — Telephone Encounter (Signed)
I have refilled but she will need an appt for further refills I believe

## 2019-03-02 NOTE — Telephone Encounter (Signed)
Pt calling to check on Xanax and Tramadol.  When I look at her medication list it states that these failed at pharmacy.  Could someone look into this and possibly resend them.

## 2019-03-04 NOTE — Telephone Encounter (Signed)
Notified daughter and she states pt has follow scheduled with PCP on 05/12/19.

## 2019-03-07 ENCOUNTER — Telehealth: Payer: Self-pay | Admitting: Family Medicine

## 2019-03-07 ENCOUNTER — Other Ambulatory Visit: Payer: Self-pay | Admitting: Family Medicine

## 2019-03-07 DIAGNOSIS — F419 Anxiety disorder, unspecified: Secondary | ICD-10-CM

## 2019-03-07 DIAGNOSIS — M159 Polyosteoarthritis, unspecified: Secondary | ICD-10-CM

## 2019-03-07 MED ORDER — ALPRAZOLAM 0.5 MG PO TABS: 0.5000 mg | ORAL_TABLET | Freq: Two times a day (BID) | ORAL | 0 refills | Status: DC | PRN

## 2019-03-07 MED ORDER — TRAMADOL HCL 50 MG PO TABS: 50.0000 mg | ORAL_TABLET | Freq: Four times a day (QID) | ORAL | 0 refills | Status: DC | PRN

## 2019-03-07 NOTE — Telephone Encounter (Unsigned)
Copied from [place] [id number]. Topic: Quick Communication - Rx Refill/Question >> Mar 07, 2019 12:35 PM Mcneil, Ja-Kwan wrote: Medication: traMADol (ULTRAM) 50 MG tablet and ALPRAZolam (XANAX) 0.5 MG tablet  Has the patient contacted their pharmacy? yes   Preferred Pharmacy (with phone number or street name): [place], [place] [phone] (Phone)  [phone] (Fax)  Agent: Please be advised that RX refills may take up to 3 business days. We ask that you follow-up with your pharmacy.

## 2019-03-07 NOTE — Telephone Encounter (Signed)
Tramadol refill and alprazolam refill. She is requesting Rx's to go to [facility] instead of [facility]  Last OV; 11/08/2018 Last Fill on tramadol:02/22/2019 #120 and 0RF Pt sig: 1 tab q6h prn Last Fill: 03/02/2019 #180 and 0RF UDS: 11/05/2017 Low risk

## 2019-04-11 ENCOUNTER — Other Ambulatory Visit: Payer: Self-pay | Admitting: Family Medicine

## 2019-04-11 DIAGNOSIS — N289 Disorder of kidney and ureter, unspecified: Secondary | ICD-10-CM

## 2019-04-11 DIAGNOSIS — E785 Hyperlipidemia, unspecified: Secondary | ICD-10-CM

## 2019-04-11 DIAGNOSIS — E039 Hypothyroidism, unspecified: Secondary | ICD-10-CM

## 2019-04-11 DIAGNOSIS — G2581 Restless legs syndrome: Secondary | ICD-10-CM

## 2019-04-11 DIAGNOSIS — E876 Hypokalemia: Secondary | ICD-10-CM

## 2019-04-22 DIAGNOSIS — Z23 Encounter for immunization: Secondary | ICD-10-CM | POA: Diagnosis not present

## 2019-04-26 ENCOUNTER — Encounter: Payer: Self-pay | Admitting: *Deleted

## 2019-05-11 NOTE — Progress Notes (Signed)
Subjective:   [name] is a 85 y.o. female who presents for Medicare Annual (Subsequent) preventive examination.  Accompanied by daughter  Review of Systems:    Home Safety/Smoke Alarms: Feels safe in home. Smoke alarms in place.  Lives alone in 1st floor condo. Walk-in shower with bench and grab bar. Daughter lives w/i a mile. Uses rollator. Has cane and walker. Wears medical alert necklace.   Female:    Mammo-  No longer doing routine screening due to age.      Dexa scan-  No longer doing routine screening due to age.           Objective:     Vitals: BP 108/60 (BP Location: Left Arm, Patient Position: Sitting, Cuff Size: Normal) Comment: all vitals done by H.Stevenson CMA  Pulse 87   Temp (!) 97.1 F (36.2 C) (Temporal)   Ht 5\' 3"  (1.6 m)   Wt 84 lb (38.1 kg)   SpO2 93%   BMI 14.88 kg/m   Body mass index is 14.88 kg/m.  Advanced Directives 05/12/2019 07/07/2018 02/04/2018 11/05/2017 10/23/2016 03/19/2016 09/27/2015  Does Patient Have a Medical Advance Directive? Yes No Yes Yes Yes Yes Yes  Type of Paramedic of [place];Living will - [place];Living will [place];Living will [place];Living will [place];Living will [place];Living will  Does patient want to make changes to medical advance directive? No - Patient declined - - - - - No - Patient declined  Copy of [place] in Chart? Yes - validated most recent copy scanned in chart (See row information) - Yes Yes Yes No - copy requested No - copy requested  Would patient like information on creating a medical advance directive? - Yes (MAU/Ambulatory/Procedural Areas - Information given) - - - - -    Tobacco Social History   Tobacco Use  Smoking Status Former Smoker  Smokeless Tobacco Never Used     Counseling given: Not Answered   Clinical Intake: Pain : No/denies pain     Past Medical History:  Diagnosis Date  . Allergy   . Anemia   . Anxiety   . Cataract 05/2011  . Chronic low blood pressure    due to dehydration  . Depression   . Esophageal disorder   . Esophageal yeast infection ([place])   . GERD (gastroesophageal reflux disease)   . Hearing loss    hearing aids  . Hyperlipidemia   . Hypothyroidism   . Myocardial infarction ([place])   . Osteoporosis   . Renal cell carcinoma ([place])    found on CT  . Thyroid disease    hypothyroidism  . Transfusion history    last 2004   Past Surgical History:  Procedure Laterality Date  . APPENDECTOMY    . BLEPHAROPLASTY Right    x 3 (all related to basal cell carcinoma)  . COLONOSCOPY    . CORONARY ARTERY BYPASS GRAFT     x2 vessel bypass -Dr. Burt Knack -[place] [facility] 06-22-14  . EUS N/A 02/04/2018   Procedure: UPPER ENDOSCOPIC ULTRASOUND (EUS) RADIAL;  Surgeon: Milus Banister, MD;  Location: [facility] END[facility]COPY;  Service: Endoscopy;  Laterality: N/A;  . FINE NEEDLE ASPIRATION N/A 02/04/2018   Procedure: FINE NEEDLE ASPIRATION (FNA) LINEAR;  Surgeon: Milus Banister, MD;  Location: [facility];  Service: Endoscopy;  Laterality: N/A;  . IR GENERIC HISTORICAL  08/29/2014   IR RADIOLOGIST EVAL &  MGMT 08/29/2014 Aletta Edouard, MD [facility] INTERV RAD  . IR GENERIC HISTORICAL  10/22/2016   IR RADIOLOGIST EVAL & MGMT 10/22/2016 Aletta Edouard, MD [facility] INTERV RAD  . IR RADIOLOGIST EVAL & MGMT  11/03/2017  . IR RADIOLOGIST EVAL & MGMT  02/15/2019  . KYPHOSIS SURGERY    . obstructed ureter Left    repair-operative  . RENAL CRYOABLATION Right 09/2014  . TONSILLECTOMY    . TOTAL ABDOMINAL HYSTERECTOMY W/ BILATERAL SALPINGOOPHORECTOMY     Family History  Problem Relation Age of Onset  . Parkinsonism Father   . Hypertension Other   . Heart disease Other        CAD.Marland Kitchen1ST DEGREE RELATIVE <60  . Hyperlipidemia Other   . Breast cancer Sister   . Breast cancer Other   . Colon cancer Neg Hx   . Esophageal cancer Neg Hx   .  Rectal cancer Neg Hx   . Stomach cancer Neg Hx    Social History   Socioeconomic History  . Marital status: Single    Spouse name: Not on file  . Number of children: 2  . Years of education: Not on file  . Highest education level: Not on file  Occupational History  . Occupation: Retired    Fish farm manager: RETIRED  Social Needs  . Financial resource strain: Not on file  . Food insecurity    Worry: Not on file    Inability: Not on file  . Transportation needs    Medical: Not on file    Non-medical: Not on file  Tobacco Use  . Smoking status: Former Research scientist (life sciences)  . Smokeless tobacco: Never Used  Substance and Sexual Activity  . Alcohol use: No  . Drug use: No  . Sexual activity: Not Currently  Lifestyle  . Physical activity    Days per week: Not on file    Minutes per session: Not on file  . Stress: Not on file  Relationships  . Social Herbalist on phone: Not on file    Gets together: Not on file    Attends religious service: Not on file    Active member of club or organization: Not on file    Attends meetings of clubs or organizations: Not on file    Relationship status: Not on file  Other Topics Concern  . Not on file  Social History Narrative   REG EXERCISE   [employer]   LIVES ALONE   END OF LIFE:PATIENT HAS LIVING WILL AND CLEARLY STATES SHE DOES NOT WANT CARDIAC [place] VENTILATION OR OTHER HEROIC OR FUTILE MEASURES.    Outpatient Encounter Medications as of 05/12/2019  Medication Sig  . aspirin 81 MG EC tablet Take 81 mg by mouth daily.    . Cholecalciferol (VITAMIN D3) 1000 UNITS CAPS Take 1,000 Units by mouth daily.   . ferrous fumarate (FERRETTS) 325 (106 FE) MG TABS Take 1 tablet by mouth daily.   . fluticasone (FLONASE) 50 MCG/ACT nasal spray Place 2 sprays into both nostrils daily as needed for allergies or rhinitis.  . folic acid (FOLVITE) 1 MG tablet Take 1 mg by mouth daily.    . furosemide (LASIX) 40 MG tablet Take 1 tablet (40 mg  total) by mouth daily as needed.  Marland Kitchen levofloxacin (LEVAQUIN) 500 MG tablet Take 1 tablet (500 mg total) by mouth daily. (Patient not taking: Reported on 05/12/2019)  . levothyroxine (SYNTHROID) 50 MCG tablet Take 1 tablet (50 mcg total) by mouth daily.  Marland Kitchen  loratadine (CLARITIN) 10 MG tablet Take 10 mg by mouth daily.  . Multiple Vitamin (MULTIVITAMIN WITH MINERALS) TABS tablet Take 1 tablet by mouth daily.  . potassium chloride SA (K-DUR) 20 MEQ tablet Take 1 tablet (20 mEq total) by mouth 2 (two) times daily.  . promethazine (PHENERGAN) 25 MG tablet Take 1 tablet (25 mg total) by mouth 2 (two) times daily.  . ranitidine (ZANTAC) 150 MG tablet Take 150 mg by mouth daily.   Marland Kitchen rOPINIRole (REQUIP) 1 MG tablet Take 1 tablet (1 mg total) by mouth at bedtime.  . simvastatin (ZOCOR) 20 MG tablet Take 1 tablet (20 mg total) by mouth daily.  Marland Kitchen tiZANidine (ZANAFLEX) 2 MG tablet Take 2 mg by mouth daily.   . traMADol (ULTRAM) 50 MG tablet Take 1 tablet (50 mg total) by mouth every 6 (six) hours as needed. for pain  . [DISCONTINUED] ALPRAZolam (XANAX) 0.5 MG tablet Take 1 tablet (0.5 mg total) by mouth 2 (two) times daily as needed for anxiety.  . [DISCONTINUED] fluconazole (DIFLUCAN) 100 MG tablet TAKE 1 TABLET BY MOUTH  DAILY AS NEEDED FOR YEAST  INFECTION  . [DISCONTINUED] furosemide (LASIX) 40 MG tablet TAKE 1 TABLET BY MOUTH  DAILY AS NEEDED  . [DISCONTINUED] levothyroxine (SYNTHROID) 50 MCG tablet TAKE 1 TABLET BY MOUTH  DAILY  . [DISCONTINUED] potassium chloride SA (K-DUR) 20 MEQ tablet TAKE 1 TABLET BY MOUTH TWO  TIMES DAILY  . [DISCONTINUED] promethazine (PHENERGAN) 25 MG tablet TAKE 1 TABLET BY MOUTH TWO  TIMES DAILY  . [DISCONTINUED] rOPINIRole (REQUIP) 1 MG tablet TAKE 1 TABLET BY MOUTH AT  BEDTIME  . [DISCONTINUED] simvastatin (ZOCOR) 20 MG tablet TAKE 1 TABLET BY MOUTH  DAILY  . [DISCONTINUED] traMADol (ULTRAM) 50 MG tablet Take 1 tablet (50 mg total) by mouth every 6 (six) hours as needed. for  pain   No facility-administered encounter medications on file as of 05/12/2019.     Activities of Daily Living In your present state of health, do you have any difficulty performing the following activities: 05/12/2019  Hearing? Y  Comment wears hearing aid  in right ear. deaf in left  Vision? N  Comment wears readers. Dr.Hecker every 6 months.  Difficulty concentrating or making decisions? N  Walking or climbing stairs? Y  Dressing or bathing? N  Doing errands, shopping? Y  Comment dtr drives and assists her  Preparing Food and eating ? Y  Comment dtr cooks and brings meals  Using the Toilet? N  In the past six months, have you accidently leaked urine? Y  Do you have problems with loss of bowel control? N  Managing your Medications? N  Managing your Finances? N  Housekeeping or managing your Housekeeping? Y  Some recent data might be hidden    Patient Care Team: Carollee Herter, Alferd Apa, DO as PCP - General Milus Banister, MD as Consulting Physician (Gastroenterology) Aletta Edouard, MD as Consulting Physician (Interventional Radiology) [place], Amy, MD as Consulting Physician (Dermatology) Sherren Mocha, MD as Consulting Physician (Cardiology) Monna Fam, MD as Consulting Physician (Ophthalmology)    Assessment:   This is a routine wellness examination for Ruksana. Physical assessment deferred to PCP.  Exercise Activities and Dietary recommendations Current Exercise Habits: Home exercise routine, Type of exercise: walking, Time (Minutes): 45, Frequency (Times/Week): 5, Weekly Exercise (Minutes/Week): 225, Intensity: Mild, Exercise limited by: None identified   Diet (meal preparation, eat out, water intake, caffeinated beverages, dairy products, fruits and vegetables): 24 hr recall. Breakfast:  bagels and coffee Lunch: sandwich Dinner:  Chicken casserole.  Green beans Needs to drink more water.   Goals    . Healthy Lifestyle     Eat heart healthy diet (full of  fruits, vegetables, whole grains, lean protein, water--limit salt, fat, and sugar intake) and increase physical activity as tolerated. Continue doing brain stimulating activities (puzzles, reading, adult coloring books, staying active) to keep memory sharp.     . Maintain current health and independence       Fall Risk Fall Risk  05/12/2019 11/05/2017 11/05/2017 10/23/2016 09/27/2015  Falls in the past year? 1 No Yes Yes Yes  Number falls in past yr: 0 - - 1 1  Comment - - - Slid off sofa reaching for walker. -  Injury with Fall? 1 - No No Yes  Risk Factor Category  - - - - High Fall Risk  Risk for fall due to : - - Other (Comment);Impaired balance/gait - Impaired balance/gait;Impaired mobility  Follow up - - - - Education provided    Depression Screen PHQ 2/9 Scores 05/12/2019 11/05/2017 10/23/2016 09/27/2015  PHQ - 2 Score 0 0 0 0     Cognitive Function      MMSE - Mini Mental State Exam 11/05/2017 10/23/2016  Orientation to time 5 5  Orientation to Place 5 5  Registration 3 3  Attention/ Calculation 5 5  Recall 3 3  Language- name 2 objects 2 2  Language- repeat 1 1  Language- follow 3 step command 3 3  Language- read & follow direction 1 1  Write a sentence 1 1  Copy design 1 1  Total score 30 30     6CIT Screen 05/12/2019  What Year? 0 points  What month? 0 points  What time? 0 points  Count back from 20 0 points  Months in reverse 0 points  Repeat phrase 0 points  Total Score 0    Immunization History  Administered Date(s) Administered  . Influenza Split 05/26/2011, 05/31/2012  . Influenza Whole 05/31/2008, 05/17/2009, 05/22/2010  . Influenza, High Dose Seasonal PF 05/21/2014, 04/30/2015, 05/18/2016, 05/09/2018, 04/22/2019  . Influenza,inj,Quad PF,6+ Mos 06/06/2013  . Influenza-Unspecified 04/09/2017, 05/09/2018  . Pneumococcal Conjugate-13 08/11/2014, 04/22/2019  . Pneumococcal Polysaccharide-23 06/11/2005  . Tdap 06/08/2016    Screening Tests Health  Maintenance  Topic Date Due  . TETANUS/TDAP  06/08/2026  . INFLUENZA VACCINE  Completed  . DEXA SCAN  Completed  . PNA vac Low Risk Adult  Completed       Plan:    Please schedule your next medicare wellness visit with me in 1 yr.  Continue to eat heart healthy diet (full of fruits, vegetables, whole grains, lean protein, water--limit salt, fat, and sugar intake) and increase physical activity as tolerated.  Continue doing brain stimulating activities (puzzles, reading, adult coloring books, staying active) to keep memory sharp.    I have personally reviewed and noted the following in the patient's chart:   . Medical and social history . Use of alcohol, tobacco or illicit drugs  . Current medications and supplements . Functional ability and status . Nutritional status . Physical activity . Advanced directives . List of other physicians . Hospitalizations, surgeries, and ER visits in previous 12 months . Vitals . Screenings to include cognitive, depression, and falls . Referrals and appointments  In addition, I have reviewed and discussed with patient certain preventive protocols, quality metrics, and best practice recommendations. A written personalized care plan for preventive services  as well as general preventive health recommendations were provided to patient.     Shela Nevin, [place]  05/12/2019

## 2019-05-12 ENCOUNTER — Encounter: Payer: Self-pay | Admitting: *Deleted

## 2019-05-12 ENCOUNTER — Encounter: Payer: Self-pay | Admitting: Family Medicine

## 2019-05-12 ENCOUNTER — Ambulatory Visit (INDEPENDENT_AMBULATORY_CARE_PROVIDER_SITE_OTHER): Payer: MEDICARE | Admitting: *Deleted

## 2019-05-12 ENCOUNTER — Other Ambulatory Visit: Payer: Self-pay

## 2019-05-12 ENCOUNTER — Ambulatory Visit (INDEPENDENT_AMBULATORY_CARE_PROVIDER_SITE_OTHER): Payer: MEDICARE | Admitting: Family Medicine

## 2019-05-12 VITALS — BP 108/60 | HR 87 | Temp 97.1°F | Ht 63.0 in | Wt 84.0 lb

## 2019-05-12 VITALS — BP 108/60 | HR 87 | Temp 97.1°F | Resp 18 | Ht 63.0 in | Wt 84.4 lb

## 2019-05-12 DIAGNOSIS — G2581 Restless legs syndrome: Secondary | ICD-10-CM | POA: Diagnosis not present

## 2019-05-12 DIAGNOSIS — E039 Hypothyroidism, unspecified: Secondary | ICD-10-CM

## 2019-05-12 DIAGNOSIS — N289 Disorder of kidney and ureter, unspecified: Secondary | ICD-10-CM

## 2019-05-12 DIAGNOSIS — M159 Polyosteoarthritis, unspecified: Secondary | ICD-10-CM | POA: Diagnosis not present

## 2019-05-12 DIAGNOSIS — E785 Hyperlipidemia, unspecified: Secondary | ICD-10-CM | POA: Diagnosis not present

## 2019-05-12 DIAGNOSIS — B3781 Candidal esophagitis: Secondary | ICD-10-CM

## 2019-05-12 DIAGNOSIS — R600 Localized edema: Secondary | ICD-10-CM

## 2019-05-12 DIAGNOSIS — E876 Hypokalemia: Secondary | ICD-10-CM | POA: Diagnosis not present

## 2019-05-12 DIAGNOSIS — R110 Nausea: Secondary | ICD-10-CM | POA: Diagnosis not present

## 2019-05-12 DIAGNOSIS — F419 Anxiety disorder, unspecified: Secondary | ICD-10-CM

## 2019-05-12 DIAGNOSIS — Z0000 Encounter for general adult medical examination without abnormal findings: Secondary | ICD-10-CM | POA: Diagnosis not present

## 2019-05-12 LAB — COMPREHENSIVE METABOLIC PANEL
ALT: 23 U/L (ref 0–35)
AST: 28 U/L (ref 0–37)
Albumin: 4.3 g/dL (ref 3.5–5.2)
Alkaline Phosphatase: 69 U/L (ref 39–117)
BUN: 19 mg/dL (ref 6–23)
CO2: 27 mEq/L (ref 19–32)
Calcium: 10.3 mg/dL (ref 8.4–10.5)
Chloride: 103 mEq/L (ref 96–112)
Creatinine, Ser: 1.26 mg/dL — ABNORMAL HIGH (ref 0.40–1.20)
GFR: 39.98 mL/min — ABNORMAL LOW (ref >60.00)
Glucose, Bld: 95 mg/dL (ref 70–99)
Potassium: 4.2 mEq/L (ref 3.5–5.1)
Sodium: 139 mEq/L (ref 135–145)
Total Bilirubin: 0.4 mg/dL (ref 0.2–1.2)
Total Protein: 6.8 g/dL (ref 6.0–8.3)

## 2019-05-12 LAB — CBC WITH DIFFERENTIAL/PLATELET
Basophils Absolute: 0 10*3/uL (ref 0.0–0.1)
Basophils Relative: 0.6 % (ref 0.0–3.0)
Eosinophils Absolute: 0 10*3/uL (ref 0.0–0.7)
Eosinophils Relative: 0.5 % (ref 0.0–5.0)
HCT: 39.2 % (ref 36.0–46.0)
Hemoglobin: 12.9 g/dL (ref 12.0–15.0)
Lymphocytes Relative: 28.3 % (ref 12.0–46.0)
Lymphs Abs: 1.5 10*3/uL (ref 0.7–4.0)
MCHC: 32.8 g/dL (ref 30.0–36.0)
MCV: 96.3 fl (ref 78.0–100.0)
Monocytes Absolute: 0.4 10*3/uL (ref 0.1–1.0)
Monocytes Relative: 7.9 % (ref 3.0–12.0)
Neutro Abs: 3.3 10*3/uL (ref 1.4–7.7)
Neutrophils Relative %: 62.7 % (ref 43.0–77.0)
Platelets: 300 10*3/uL (ref 150.0–400.0)
RBC: 4.07 Mil/uL (ref 3.87–5.11)
RDW: 12.7 % (ref 11.5–15.5)
WBC: 5.3 10*3/uL (ref 4.0–10.5)

## 2019-05-12 LAB — LIPID PANEL
Cholesterol: 169 mg/dL (ref 0–200)
HDL: 77.7 mg/dL (ref >39.00)
LDL Cholesterol: 80 mg/dL (ref 0–99)
NonHDL: 91.38
Total CHOL/HDL Ratio: 2
Triglycerides: 59 mg/dL (ref 0.0–149.0)
VLDL: 11.8 mg/dL (ref 0.0–40.0)

## 2019-05-12 LAB — TSH: TSH: 3.06 u[IU]/mL (ref 0.35–4.50)

## 2019-05-12 MED ORDER — LEVOTHYROXINE SODIUM 50 MCG PO TABS: 50.0000 ug | ORAL_TABLET | Freq: Every day | ORAL | 3 refills | Status: DC

## 2019-05-12 MED ORDER — ALPRAZOLAM 0.5 MG PO TABS: 0.5000 mg | ORAL_TABLET | Freq: Two times a day (BID) | ORAL | 1 refills | Status: DC | PRN

## 2019-05-12 MED ORDER — SIMVASTATIN 20 MG PO TABS: 20.0000 mg | ORAL_TABLET | Freq: Every day | ORAL | 3 refills | Status: DC

## 2019-05-12 MED ORDER — POTASSIUM CHLORIDE CRYS ER 20 MEQ PO TBCR: 20.0000 meq | EXTENDED_RELEASE_TABLET | Freq: Two times a day (BID) | ORAL | 3 refills | Status: DC

## 2019-05-12 MED ORDER — ROPINIROLE HCL 1 MG PO TABS: 1.0000 mg | ORAL_TABLET | Freq: Every day | ORAL | 3 refills | Status: DC

## 2019-05-12 MED ORDER — FLUCONAZOLE 100 MG PO TABS: ORAL_TABLET | ORAL | 2 refills | Status: DC

## 2019-05-12 MED ORDER — FUROSEMIDE 40 MG PO TABS: 40.0000 mg | ORAL_TABLET | Freq: Every day | ORAL | 3 refills | Status: DC | PRN

## 2019-05-12 MED ORDER — PROMETHAZINE HCL 25 MG PO TABS: 25.0000 mg | ORAL_TABLET | Freq: Two times a day (BID) | ORAL | 1 refills | Status: DC

## 2019-05-12 MED ORDER — TRAMADOL HCL 50 MG PO TABS: 50.0000 mg | ORAL_TABLET | Freq: Four times a day (QID) | ORAL | 1 refills | Status: DC | PRN

## 2019-05-12 NOTE — Patient Instructions (Addendum)
Please schedule your next medicare wellness visit with me in 1 yr.  Continue to eat heart healthy diet (full of fruits, vegetables, whole grains, lean protein, water--limit salt, fat, and sugar intake) and increase physical activity as tolerated.  Continue doing brain stimulating activities (puzzles, reading, adult coloring books, staying active) to keep memory sharp.    [name] , Thank you for taking time to come for your Medicare Wellness Visit. I appreciate your ongoing commitment to your health goals. Please review the following plan we discussed and let me know if I can assist you in the future.   These are the goals we discussed: Goals    . Healthy Lifestyle     Eat heart healthy diet (full of fruits, vegetables, whole grains, lean protein, water--limit salt, fat, and sugar intake) and increase physical activity as tolerated. Continue doing brain stimulating activities (puzzles, reading, adult coloring books, staying active) to keep memory sharp.     . Maintain current health and independence       This is a list of the screening recommended for you and due dates:  Health Maintenance  Topic Date Due  . Tetanus Vaccine  06/08/2026  . Flu Shot  Completed  . DEXA scan (bone density measurement)  Completed  . Pneumonia vaccines  Completed    Health Maintenance After Age 97 After age 32, you are at a higher risk for certain long-term diseases and infections as well as injuries from falls. Falls are a major cause of broken bones and head injuries in people who are older than age 64. Getting regular preventive care can help to keep you healthy and well. Preventive care includes getting regular testing and making lifestyle changes as recommended by your health care provider. Talk with your health care provider about:  Which screenings and tests you should have. A screening is a test that checks for a disease when you have no symptoms.  A diet and exercise plan that is right for  you. What should I know about screenings and tests to prevent falls? Screening and testing are the best ways to find a health problem early. Early diagnosis and treatment give you the best chance of managing medical conditions that are common after age 23. Certain conditions and lifestyle choices may make you more likely to have a fall. Your health care provider may recommend:  Regular vision checks. Poor vision and conditions such as cataracts can make you more likely to have a fall. If you wear glasses, make sure to get your prescription updated if your vision changes.  Medicine review. Work with your health care provider to regularly review all of the medicines you are taking, including over-the-counter medicines. Ask your health care provider about any side effects that may make you more likely to have a fall. Tell your health care provider if any medicines that you take make you feel dizzy or sleepy.  Osteoporosis screening. Osteoporosis is a condition that causes the bones to get weaker. This can make the bones weak and cause them to break more easily.  Blood pressure screening. Blood pressure changes and medicines to control blood pressure can make you feel dizzy.  Strength and balance checks. Your health care provider may recommend certain tests to check your strength and balance while standing, walking, or changing positions.  Foot health exam. Foot pain and numbness, as well as not wearing proper footwear, can make you more likely to have a fall.  Depression screening. You may be more  likely to have a fall if you have a fear of falling, feel emotionally low, or feel unable to do activities that you used to do.  Alcohol use screening. Using too much alcohol can affect your balance and may make you more likely to have a fall. What actions can I take to lower my risk of falls? General instructions  Talk with your health care provider about your risks for falling. Tell your health care  provider if: ? You fall. Be sure to tell your health care provider about all falls, even ones that seem minor. ? You feel dizzy, sleepy, or off-balance.  Take over-the-counter and prescription medicines only as told by your health care provider. These include any supplements.  Eat a healthy diet and maintain a healthy weight. A healthy diet includes low-fat dairy products, low-fat (lean) meats, and fiber from whole grains, beans, and lots of fruits and vegetables. Home safety  Remove any tripping hazards, such as rugs, cords, and clutter.  Install safety equipment such as grab bars in bathrooms and safety rails on stairs.  Keep rooms and walkways well-lit. Activity   Follow a regular exercise program to stay fit. This will help you maintain your balance. Ask your health care provider what types of exercise are appropriate for you.  If you need a cane or walker, use it as recommended by your health care provider.  Wear supportive shoes that have nonskid soles. Lifestyle  Do not drink alcohol if your health care provider tells you not to drink.  If you drink alcohol, limit how much you have: ? 0-1 drink a day for women. ? 0-2 drinks a day for men.  Be aware of how much alcohol is in your drink. In the [place] one drink equals one typical bottle of beer (12 oz), one-half glass of wine (5 oz), or one shot of hard liquor (1 oz).  Do not use any products that contain nicotine or tobacco, such as cigarettes and e-cigarettes. If you need help quitting, ask your health care provider. Summary  Having a healthy lifestyle and getting preventive care can help to protect your health and wellness after age 72.  Screening and testing are the best way to find a health problem early and help you avoid having a fall. Early diagnosis and treatment give you the best chance for managing medical conditions that are more common for people who are older than age 37.  Falls are a major cause of broken  bones and head injuries in people who are older than age 53. Take precautions to prevent a fall at home.  Work with your health care provider to learn what changes you can make to improve your health and wellness and to prevent falls. This information is not intended to replace advice given to you by your health care provider. Make sure you discuss any questions you have with your health care provider. Document Released: 06/24/2017 Document Revised: 12/02/2018 Document Reviewed: 06/24/2017 Elsevier Patient Education  2020 [employer].

## 2019-05-12 NOTE — Patient Instructions (Signed)
Preventing High Cholesterol °Cholesterol is a white, waxy substance similar to fat that the human body needs to help build cells. The liver makes all the cholesterol that a person's body needs. Having high cholesterol (hypercholesterolemia) increases a person's risk for heart disease and stroke. Extra (excess) cholesterol comes from the food the person eats. °High cholesterol can often be prevented with diet and lifestyle changes. If you already have high cholesterol, you can control it with diet and lifestyle changes and with medicine. °How can high cholesterol affect me? °If you have high cholesterol, deposits (plaques) may build up on the walls of your arteries. The arteries are the blood vessels that carry blood away from your heart. °Plaques make the arteries narrower and stiffer. This can limit or block blood flow and cause blood clots to form. Blood clots: °· Are tiny balls of cells that form in your blood. °· Can move to the heart or brain, causing a heart attack or stroke. °Plaques in arteries greatly increase your risk for heart attack and stroke.Making diet and lifestyle changes can reduce your risk for these conditions that may threaten your life. °What can increase my risk? °This condition is more likely to develop in people who: °· Eat foods that are high in saturated fat or cholesterol. Saturated fat is mostly found in: °? Foods that contain animal fat, such as red meat and some dairy products. °? Certain fatty foods made from plants, such as tropical oils. °· Are overweight. °· Are not getting enough exercise. °· Have a family history of high cholesterol. °What actions can I take to prevent this? °Nutrition ° °· Eat less saturated fat. °· Avoid trans fats (partially hydrogenated oils). These are often found in margarine and in some baked goods, fried foods, and snacks bought in packages. °· Avoid precooked or cured meat, such as sausages or meat loaves. °· Avoid foods and drinks that have added  sugars. °· Eat more fruits, vegetables, and whole grains. °· Choose healthy sources of protein, such as fish, poultry, lean cuts of red meat, beans, peas, lentils, and nuts. °· Choose healthy sources of fat, such as: °? Nuts. °? Vegetable oils, especially olive oil. °? Fish that have healthy fats (omega-3 fatty acids), such as mackerel or salmon. °The items listed above may not be a complete list of recommended foods and beverages. Contact a dietitian for more information. °Lifestyle °· Lose weight if you are overweight. Losing 5-10 lb (2.3-4.5 kg) can help prevent or control high cholesterol. It can also lower your risk for diabetes and high blood pressure. Ask your health care provider to help you with a diet and exercise plan to lose weight safely. °· Do not use any products that contain nicotine or tobacco, such as cigarettes, e-cigarettes, and chewing tobacco. If you need help quitting, ask your health care provider. °· Limit your alcohol intake. °? Do not drink alcohol if: °§ Your health care provider tells you not to drink. °§ You are pregnant, may be pregnant, or are planning to become pregnant. °? If you drink alcohol: °§ Limit how much you use to: °§ 0-1 drink a day for women. °§ 0-2 drinks a day for men. °§ Be aware of how much alcohol is in your drink. In the [place] one drink equals one 12 oz bottle of beer (355 mL), one 5 oz glass of wine (148 mL), or one 1½ oz glass of hard liquor (44 mL). °Activity ° °· Get enough exercise. Each week, do at   least 150 minutes of exercise that takes a medium level of effort (moderate-intensity exercise). °? This is exercise that: °§ Makes your heart beat faster and makes you breathe harder than usual. °§ Allows you to still be able to talk. °? You could exercise in short sessions several times a day or longer sessions a few times a week. For example, on 5 days each week, you could walk fast or ride your bike 3 times a day for 10 minutes each time. °· Do exercises as told  by your health care provider. °Medicines °· In addition to diet and lifestyle changes, your health care provider may recommend medicines to help lower cholesterol. This may be a medicine to lower the amount of cholesterol your liver makes. You may need medicine if: °? Diet and lifestyle changes do not lower your cholesterol enough. °? You have high cholesterol and other risk factors for heart disease or stroke. °· Take over-the-counter and prescription medicines only as told by your health care provider. °General information °· Manage your risk factors for high cholesterol. Talk with your health care provider about all your risk factors and how to lower your risk. °· Manage other conditions that you have, such as diabetes or high blood pressure (hypertension). °· Have blood tests to check your cholesterol levels at regular points in time as told by your health care provider. °· Keep all follow-up visits as told by your health care provider. This is important. °Where to find more information °· American Heart Association: www.heart.org °· National Heart, Lung, and Blood Institute: www.nhlbi.nih.gov °Summary °· High cholesterol increases your risk for heart disease and stroke. By keeping your cholesterol level low, you can reduce your risk for these conditions. °· High cholesterol can often be prevented with diet and lifestyle changes. °· Work with your health care provider to manage your risk factors, and have your blood tested regularly. °This information is not intended to replace advice given to you by your health care provider. Make sure you discuss any questions you have with your health care provider. °Document Released: 08/26/2015 Document Revised: 12/03/2018 Document Reviewed: 04/19/2016 °Elsevier Patient Education © 2020 [employer] ° °

## 2019-05-12 NOTE — Assessment & Plan Note (Signed)
Tolerating statin, encouraged heart healthy diet, avoid trans fats, minimize simple carbs and saturated fats. Increase exercise as tolerated 

## 2019-05-12 NOTE — Assessment & Plan Note (Signed)
Check labs con't meds 

## 2019-05-12 NOTE — Progress Notes (Signed)
Patient ID: [name], female    DOB: [birth year]  Age: 85 y.o. MRN: [id number]    Subjective:  Subjective  HPI [name] presents for f/u thyroid, cholesterol     Her daughter is with her  Review of Systems  Constitutional: Negative for appetite change, diaphoresis, fatigue and unexpected weight change.  Eyes: Negative for pain, redness and visual disturbance.  Respiratory: Negative for cough, chest tightness, shortness of breath and wheezing.   Cardiovascular: Negative for chest pain, palpitations and leg swelling.  Endocrine: Negative for cold intolerance, heat intolerance, polydipsia, polyphagia and polyuria.  Genitourinary: Negative for difficulty urinating, dysuria and frequency.  Neurological: Negative for dizziness, light-headedness, numbness and headaches.    History Past Medical History:  Diagnosis Date  . Allergy   . Anemia   . Anxiety   . Cataract 05/2011  . Chronic low blood pressure    due to dehydration  . Depression   . Esophageal disorder   . Esophageal yeast infection ([place])   . GERD (gastroesophageal reflux disease)   . Hearing loss    hearing aids  . Hyperlipidemia   . Hypothyroidism   . Myocardial infarction ([place])   . Osteoporosis   . Renal cell carcinoma ([place])    found on CT  . Thyroid disease    hypothyroidism  . Transfusion history    last 2004    She has a past surgical history that includes obstructed ureter (Left); Total abdominal hysterectomy w/ bilateral salpingoophorectomy; Kyphosis surgery; Tonsillectomy; Appendectomy; Colonoscopy; Coronary artery bypass graft; Renal cryoablation (Right, 09/2014); ir generic historical (08/29/2014); ir generic historical (10/22/2016); IR Radiologist Eval & Mgmt (11/03/2017); Blepharoplasty (Right); EUS (N/A, 02/04/2018); Fine needle aspiration (N/A, 02/04/2018); and IR Radiologist Eval & Mgmt (02/15/2019).   Her family history includes Breast cancer in her sister and another family member; Heart disease in  an other family member; Hyperlipidemia in an other family member; Hypertension in an other family member; Parkinsonism in her father.She reports that she has quit smoking. She has never used smokeless tobacco. She reports that she does not drink alcohol or use drugs.  Current Outpatient Medications on File Prior to Visit  Medication Sig Dispense Refill  . aspirin 81 MG EC tablet Take 81 mg by mouth daily.      . Cholecalciferol (VITAMIN D3) 1000 UNITS CAPS Take 1,000 Units by mouth daily.     . ferrous fumarate (FERRETTS) 325 (106 FE) MG TABS Take 1 tablet by mouth daily.     . fluticasone (FLONASE) 50 MCG/ACT nasal spray Place 2 sprays into both nostrils daily as needed for allergies or rhinitis.    . folic acid (FOLVITE) 1 MG tablet Take 1 mg by mouth daily.      Marland Kitchen loratadine (CLARITIN) 10 MG tablet Take 10 mg by mouth daily.    . Multiple Vitamin (MULTIVITAMIN WITH MINERALS) TABS tablet Take 1 tablet by mouth daily.    . ranitidine (ZANTAC) 150 MG tablet Take 150 mg by mouth daily.     Marland Kitchen tiZANidine (ZANAFLEX) 2 MG tablet Take 2 mg by mouth daily.     Marland Kitchen levofloxacin (LEVAQUIN) 500 MG tablet Take 1 tablet (500 mg total) by mouth daily. (Patient not taking: Reported on 05/12/2019) 7 tablet 0   No current facility-administered medications on file prior to visit.      Objective:  Objective  Physical Exam Vitals signs and nursing note reviewed.  Constitutional:      Appearance: She is well-developed.  HENT:     Head: Normocephalic and atraumatic.  Eyes:     Conjunctiva/sclera: Conjunctivae normal.  Neck:     Musculoskeletal: Normal range of motion and neck supple.     Thyroid: No thyromegaly.     Vascular: No carotid bruit or JVD.  Cardiovascular:     Rate and Rhythm: Normal rate and regular rhythm.     Heart sounds: Normal heart sounds. No murmur.  Pulmonary:     Effort: Pulmonary effort is normal. No respiratory distress.     Breath sounds: Normal breath sounds. No wheezing or  rales.  Chest:     Chest wall: No tenderness.  Neurological:     Mental Status: She is alert and oriented to person, place, and time.    BP 108/60 (BP Location: Left Arm, Patient Position: Sitting, Cuff Size: Normal)   Pulse 87   Temp (!) 97.1 F (36.2 C) (Temporal)   Resp 18   Ht 5\' 3"  (1.6 m)   Wt 84 lb 6.4 oz (38.3 kg)   SpO2 93%   BMI 14.95 kg/m  Wt Readings from Last 3 Encounters:  05/12/19 84 lb (38.1 kg)  05/12/19 84 lb 6.4 oz (38.3 kg)  02/18/19 88 lb 12.8 oz (40.3 kg)     Lab Results  Component Value Date   WBC 7.3 11/08/2018   HGB 14.3 11/08/2018   HCT 43.0 11/08/2018   PLT 280.0 11/08/2018   GLUCOSE 97 11/08/2018   CHOL 182 11/08/2018   TRIG 77.0 11/08/2018   HDL 73.90 11/08/2018   LDLDIRECT 165.1 05/10/2008   LDLCALC 93 11/08/2018   ALT 25 11/08/2018   AST 28 11/08/2018   NA 136 11/08/2018   K 4.1 11/08/2018   CL 102 11/08/2018   CREATININE 1.00 02/08/2019   BUN 24 (H) 11/08/2018   CO2 23 11/08/2018   TSH 2.54 11/08/2018   INR 0.89 09/29/2014   HGBA1C 5.8 10/27/2013    Ir Radiologist Eval & Mgmt  Result Date: 02/15/2019 Please refer to notes tab for details about interventional procedure. (Op Note)    Assessment & Plan:  Plan  I have changed [name]'s potassium chloride SA, levothyroxine, furosemide, promethazine, simvastatin, and rOPINIRole. I am also having her maintain her aspirin, folic acid, Vitamin D3, ferrous fumarate, ranitidine, multivitamin with minerals, tiZANidine, fluticasone, loratadine, levofloxacin, fluconazole, traMADol, and ALPRAZolam.  Meds ordered this encounter  Medications  . potassium chloride SA (K-DUR) 20 MEQ tablet    Sig: Take 1 tablet (20 mEq total) by mouth 2 (two) times daily.    Dispense:  180 tablet    Refill:  3    Requesting 1 year supply  . fluconazole (DIFLUCAN) 100 MG tablet    Sig: TAKE 1 TABLET BY MOUTH  DAILY AS NEEDED FOR YEAST  INFECTION    Dispense:  90 tablet    Refill:  2  .  levothyroxine (SYNTHROID) 50 MCG tablet    Sig: Take 1 tablet (50 mcg total) by mouth daily.    Dispense:  90 tablet    Refill:  3    Requesting 1 year supply  . furosemide (LASIX) 40 MG tablet    Sig: Take 1 tablet (40 mg total) by mouth daily as needed.    Dispense:  90 tablet    Refill:  3    Requesting 1 year supply  . promethazine (PHENERGAN) 25 MG tablet    Sig: Take 1 tablet (25 mg total) by mouth 2 (two) times  daily.    Dispense:  180 tablet    Refill:  1  . simvastatin (ZOCOR) 20 MG tablet    Sig: Take 1 tablet (20 mg total) by mouth daily.    Dispense:  90 tablet    Refill:  3    Requesting 1 year supply  . rOPINIRole (REQUIP) 1 MG tablet    Sig: Take 1 tablet (1 mg total) by mouth at bedtime.    Dispense:  90 tablet    Refill:  3    Requesting 1 year supply  . traMADol (ULTRAM) 50 MG tablet    Sig: Take 1 tablet (50 mg total) by mouth every 6 (six) hours as needed. for pain    Dispense:  120 tablet    Refill:  1  . ALPRAZolam (XANAX) 0.5 MG tablet    Sig: Take 1 tablet (0.5 mg total) by mouth 2 (two) times daily as needed for anxiety.    Dispense:  180 tablet    Refill:  1    Problem List Items Addressed This Visit      Unprioritized   Hyperlipidemia    Tolerating statin, encouraged heart healthy diet, avoid trans fats, minimize simple carbs and saturated fats. Increase exercise as tolerated      Relevant Medications   furosemide (LASIX) 40 MG tablet   simvastatin (ZOCOR) 20 MG tablet   Other Relevant Orders   Lipid panel   CBC with Differential/Platelet   Comprehensive metabolic panel   Hypothyroidism    Check labs  con't meds       Relevant Medications   levothyroxine (SYNTHROID) 50 MCG tablet   Other Relevant Orders   CBC with Differential/Platelet   Comprehensive metabolic panel   TSH   Osteoarthritis of multiple joints   Relevant Medications   traMADol (ULTRAM) 50 MG tablet   RLS (restless legs syndrome)   Relevant Medications    rOPINIRole (REQUIP) 1 MG tablet    Other Visit Diagnoses    Esophageal candidiasis (HCC)    -  Primary   Relevant Medications   fluconazole (DIFLUCAN) 100 MG tablet   Other Relevant Orders   CBC with Differential/Platelet   Hypokalemia       Relevant Medications   potassium chloride SA (K-DUR) 20 MEQ tablet   Other Relevant Orders   TSH   Kidney function abnormal       Relevant Medications   furosemide (LASIX) 40 MG tablet   Nausea       Relevant Medications   promethazine (PHENERGAN) 25 MG tablet   Anxiety       Relevant Medications   ALPRAZolam (XANAX) 0.5 MG tablet   Lower extremity edema       Relevant Orders   Ambulatory referral to Vascular Surgery      Follow-up: Return in about 6 months (around 11/09/2019), or if symptoms worsen or fail to improve.  Ann Held, DO

## 2019-06-27 ENCOUNTER — Encounter (HOSPITAL_COMMUNITY): Payer: MEDICARE

## 2019-06-29 ENCOUNTER — Other Ambulatory Visit: Payer: Self-pay

## 2019-06-29 DIAGNOSIS — R600 Localized edema: Secondary | ICD-10-CM

## 2019-07-06 ENCOUNTER — Other Ambulatory Visit: Payer: Self-pay

## 2019-07-06 ENCOUNTER — Ambulatory Visit (HOSPITAL_COMMUNITY)
Admission: RE | Admit: 2019-07-06 | Discharge: 2019-07-06 | Disposition: A | Discharge status: Home / Self Care | Payer: MEDICARE | Source: Ambulatory Visit | Attending: Family | Admitting: Family

## 2019-07-06 ENCOUNTER — Ambulatory Visit (INDEPENDENT_AMBULATORY_CARE_PROVIDER_SITE_OTHER): Payer: MEDICARE | Admitting: Physician Assistant

## 2019-07-06 VITALS — BP 128/61 | HR 64 | Temp 97.7°F | Resp 12 | Ht 63.0 in | Wt 95.4 lb

## 2019-07-06 DIAGNOSIS — I872 Venous insufficiency (chronic) (peripheral): Secondary | ICD-10-CM

## 2019-07-06 DIAGNOSIS — R600 Localized edema: Secondary | ICD-10-CM

## 2019-07-06 NOTE — Progress Notes (Signed)
[facility] [place]   Reason for referral: Swollen Bilateral legs  History of Present Illness  [name] is a 85 y.o. female who presents with chief complaint: swollen leg.  She has history of left anterior leg wound treated by Wound center Dr. Dellia Nims from November to March.   The wound has healed.  Patient notes, onset of swelling > 12  months ago, associated with prolonged sitting or standing.  The patient has had no history of DVT, no history of varicose vein, positive history of venous stasis ulcers, no history of  Lymphedema and positive history of skin changes in lower legs.    The patient has  used compression stockings in the past.  Past medical history includes: HTN CAD, Hyperlipidemia  Past Medical History:  Diagnosis Date  . Allergy   . Anemia   . Anxiety   . Cataract 05/2011  . Chronic low blood pressure    due to dehydration  . Depression   . Esophageal disorder   . Esophageal yeast infection ([place])   . GERD (gastroesophageal reflux disease)   . Hearing loss    hearing aids  . Hyperlipidemia   . Hypothyroidism   . Myocardial infarction ([place])   . Osteoporosis   . Renal cell carcinoma ([place])    found on CT  . Thyroid disease    hypothyroidism  . Transfusion history    last 2004    Past Surgical History:  Procedure Laterality Date  . APPENDECTOMY    . BLEPHAROPLASTY Right    x 3 (all related to basal cell carcinoma)  . COLONOSCOPY    . CORONARY ARTERY BYPASS GRAFT     x2 vessel bypass -Dr. Burt Knack -[place] L[facility] 06-22-14  . EUS N/A 02/04/2018   Procedure: UPPER ENDOSCOPIC ULTRASOUND (EUS) RADIAL;  Surgeon: Milus Banister, MD;  Location: [facility];  Service: Endoscopy;  Laterality: N/A;  . FINE NEEDLE ASPIRATION N/A 02/04/2018   Procedure: FINE NEEDLE ASPIRATION (FNA) LINEAR;  Surgeon: Milus Banister, MD;  Location: [facility];  Service: Endoscopy;  Laterality: N/A;  . IR GENERIC HISTORICAL  08/29/2014   IR RADIOLOGIST EVAL & MGMT  08/29/2014 Aletta Edouard, MD [facility] INTERV RAD  . IR GENERIC HISTORICAL  10/22/2016   IR RADIOLOGIST EVAL & MGMT 10/22/2016 Aletta Edouard, MD [facility] INTERV RAD  . IR RADIOLOGIST EVAL & MGMT  11/03/2017  . IR RADIOLOGIST EVAL & MGMT  02/15/2019  . KYPHOSIS SURGERY    . obstructed ureter Left    repair-operative  . RENAL CRYOABLATION Right 09/2014  . TONSILLECTOMY    . TOTAL ABDOMINAL HYSTERECTOMY W/ BILATERAL SALPINGOOPHORECTOMY      Social History   Socioeconomic History  . Marital status: Single    Spouse name: Not on file  . Number of children: 2  . Years of education: Not on file  . Highest education level: Not on file  Occupational History  . Occupation: Retired    Fish farm manager: RETIRED  Social Needs  . Financial resource strain: Not on file  . Food insecurity    Worry: Not on file    Inability: Not on file  . Transportation needs    Medical: Not on file    Non-medical: Not on file  Tobacco Use  . Smoking status: Former Research scientist (life sciences)  . Smokeless tobacco: Never Used  Substance and Sexual Activity  . Alcohol use: No  . Drug use: No  . Sexual activity: Not Currently  Lifestyle  . Physical  activity    Days per week: Not on file    Minutes per session: Not on file  . Stress: Not on file  Relationships  . Social Herbalist on phone: Not on file    Gets together: Not on file    Attends religious service: Not on file    Active member of club or organization: Not on file    Attends meetings of clubs or organizations: Not on file    Relationship status: Not on file  . Intimate partner violence    Fear of current or ex partner: Not on file    Emotionally abused: Not on file    Physically abused: Not on file    Forced sexual activity: Not on file  Other Topics Concern  . Not on file  Social History Narrative   REG EXERCISE   [place]   LIVES ALONE   END OF LIFE:PATIENT HAS LIVING WILL AND CLEARLY STATES SHE DOES NOT WANT CARDIAC [place] VENTILATION OR  OTHER HEROIC OR FUTILE MEASURES.    Family History  Problem Relation Age of Onset  . Parkinsonism Father   . Hypertension Other   . Heart disease Other        CAD.Marland Kitchen1ST DEGREE RELATIVE <60  . Hyperlipidemia Other   . Breast cancer Sister   . Breast cancer Other   . Colon cancer Neg Hx   . Esophageal cancer Neg Hx   . Rectal cancer Neg Hx   . Stomach cancer Neg Hx     Current Outpatient Medications on File Prior to Visit  Medication Sig Dispense Refill  . ALPRAZolam (XANAX) 0.5 MG tablet Take 1 tablet (0.5 mg total) by mouth 2 (two) times daily as needed for anxiety. 180 tablet 1  . aspirin 81 MG EC tablet Take 81 mg by mouth daily.      . Cholecalciferol (VITAMIN D3) 1000 UNITS CAPS Take 1,000 Units by mouth daily.     . ferrous fumarate (FERRETTS) 325 (106 FE) MG TABS Take 1 tablet by mouth daily.     . fluconazole (DIFLUCAN) 100 MG tablet TAKE 1 TABLET BY MOUTH  DAILY AS NEEDED FOR YEAST  INFECTION 90 tablet 2  . fluticasone (FLONASE) 50 MCG/ACT nasal spray Place 2 sprays into both nostrils daily as needed for allergies or rhinitis.    . folic acid (FOLVITE) 1 MG tablet Take 1 mg by mouth daily.      . furosemide (LASIX) 40 MG tablet Take 1 tablet (40 mg total) by mouth daily as needed. 90 tablet 3  . levofloxacin (LEVAQUIN) 500 MG tablet Take 1 tablet (500 mg total) by mouth daily. 7 tablet 0  . levothyroxine (SYNTHROID) 50 MCG tablet Take 1 tablet (50 mcg total) by mouth daily. 90 tablet 3  . loratadine (CLARITIN) 10 MG tablet Take 10 mg by mouth daily.    . Multiple Vitamin (MULTIVITAMIN WITH MINERALS) TABS tablet Take 1 tablet by mouth daily.    . potassium chloride SA (K-DUR) 20 MEQ tablet Take 1 tablet (20 mEq total) by mouth 2 (two) times daily. 180 tablet 3  . promethazine (PHENERGAN) 25 MG tablet Take 1 tablet (25 mg total) by mouth 2 (two) times daily. 180 tablet 1  . ranitidine (ZANTAC) 150 MG tablet Take 150 mg by mouth daily.     Marland Kitchen rOPINIRole (REQUIP) 1 MG tablet Take  1 tablet (1 mg total) by mouth at bedtime. 90 tablet 3  . simvastatin (ZOCOR) 20  MG tablet Take 1 tablet (20 mg total) by mouth daily. 90 tablet 3  . tiZANidine (ZANAFLEX) 2 MG tablet Take 2 mg by mouth daily.     . traMADol (ULTRAM) 50 MG tablet Take 1 tablet (50 mg total) by mouth every 6 (six) hours as needed. for pain 120 tablet 1   No current facility-administered medications on file prior to visit.     Allergies as of 07/06/2019 - Review Complete 07/06/2019  Allergen Reaction Noted  . Zofran [ondansetron hcl] Anaphylaxis and Other (See Comments) 07/23/2012  . Clarithromycin    . Codeine    . Diazepam Other (See Comments) 10/11/2012  . Sulfonamide derivatives    . Prilosec [omeprazole magnesium] Rash 05/26/2011  . Trazodone Anxiety 10/11/2012     ROS:   General:  No weight loss, Fever, chills  HEENT: No recent headaches, no nasal bleeding, no visual changes, no sore throat  Neurologic: No dizziness, blackouts, seizures. No recent symptoms of stroke or mini- stroke. No recent episodes of slurred speech, or temporary blindness.  Cardiac: No recent episodes of chest pain/pressure, no shortness of breath at rest.  No shortness of breath with exertion.  Denies history of atrial fibrillation or irregular heartbeat  Vascular: No history of rest pain in feet.  No history of claudication.  positive history of non-healing ulcer, No history of DVT   Pulmonary: No home oxygen, no productive cough, no hemoptysis,  No asthma or wheezing  Musculoskeletal:  [ ]  Arthritis, [ ]  Low back pain,  [ ]  Joint pain  Hematologic:No history of hypercoagulable state.  No history of easy bleeding.  No history of anemia  Gastrointestinal: No hematochezia or melena,  No gastroesophageal reflux, no trouble swallowing  Urinary: [ ]  chronic Kidney disease, [ ]  on HD - [ ]  MWF or [ ]  TTHS, [ ]  Burning with urination, [ ]  Frequent urination, [ ]  Difficulty urinating;   Skin: No rashes  Psychological: No  history of anxiety,  No history of depression  Physical Examination  Vitals:   07/06/19 1448  BP: 128/61  Pulse: 64  Resp: 12  Temp: 97.7 F (36.5 C)  TempSrc: Temporal  SpO2: 98%  Weight: 95 lb 6.4 oz (43.3 kg)  Height: 5\' 3"  (1.6 m)    Body mass index is 16.9 kg/m.  General:  Alert and oriented, no acute distress HEENT: Normal Neck: No bruit or JVD Pulmonary: Clear to auscultation bilaterally Cardiac: Regular Rate and Rhythm without murmur Abdomen: Soft, non-tender, non-distended, no mass, no scars Skin: B LE anterior brawny skin changes  Extremity Pulses:  2+ radial, brachial, femoral, left dorsalis pedis, B posterior tibial pulses  Musculoskeletal: No deformity, positive pitting edema edema  Neurologic: Upper and lower extremity motor 5/5 and symmetric  DATA:     Venous Reflux Times Normal value < 0.5 sec +--------------+------+---------+--------+--------+ RIGHT         RefluxReflux NoDiameterComments                Yes                            +--------------+------+---------+--------+--------+ CFV                    no                     +--------------+------+---------+--------+--------+ FV mid  no                     +--------------+------+---------+--------+--------+ Popliteal      yes                            +--------------+------+---------+--------+--------+ GSV at [facility]     yes            0.447           +--------------+------+---------+--------+--------+ GSV prox thigh yes            0.350           +--------------+------+---------+--------+--------+ GSV mid thigh  yes            0.408           +--------------+------+---------+--------+--------+ GSV dist thigh yes            0.420           +--------------+------+---------+--------+--------+ GSV at knee    yes            0.362           +--------------+------+---------+--------+--------+ GSV prox calf  yes             0.253           +--------------+------+---------+--------+--------+ GSV mid calf   yes            0.271           +--------------+------+---------+--------+--------+ SSV Pop Fossa          no     0.152           +--------------+------+---------+--------+--------+ SSV prox calf          no     0.170           +--------------+------+---------+--------+--------+ SSV mid calf           no     0.150           +--------------+------+---------+--------+--------+    +--------------+------+---------+--------+----------------+ LEFT          RefluxReflux NoDiameterComments                        Yes                                    +--------------+------+---------+--------+----------------+ CFV                    no                             +--------------+------+---------+--------+----------------+ FV mid                 no                             +--------------+------+---------+--------+----------------+ Popliteal      yes                                    +--------------+------+---------+--------+----------------+ GSV at [facility]             no     0.246  tortuous         +--------------+------+---------+--------+----------------+ GSV prox thigh  no     0.193  tortuous         +--------------+------+---------+--------+----------------+ GSV mid thigh  yes            0.116  tortuous         +--------------+------+---------+--------+----------------+ GSV dist thigh yes            0.139  tortuous         +--------------+------+---------+--------+----------------+ GSV at knee    yes            0.173  tortuous         +--------------+------+---------+--------+----------------+ GSV prox calf          no     0.223                   +--------------+------+---------+--------+----------------+ GSV mid calf   yes            0.250                    +--------------+------+---------+--------+----------------+ SSV Pop Fossa                        chronic thrombus +--------------+------+---------+--------+----------------+ SSV prox calf                        chronic thrombus +--------------+------+---------+--------+----------------+ SSV mid calf                         chronic thrombus +--------------+------+---------+--------+----------------+       Summary:   Right: There is no evidence of deep vein thrombosis in the lower extremity within the CFV, FV, and POPV. There is no evidence of superficial venous thrombosis within the visualized GSV and SSV. Pulsatile venous flow noted. Groin lymph nodes noted. Reflux  measurements and notes as noted above.   Left: Findings consistent with chronic superficial vein thrombosis involving the left small saphenous vein. There is no evidence of deep vein thrombosis in the lower extremity within the CFV, FV, and POPV. Pulsatile venous flow noted. Groin lymph nodes  noted. Reflux measurements and notes as noted above.  07/2018 ABI's  Demonstrated biphasic and triphasic flow with calcified vessels.  Assessment: Venous reflux B LE B LE pitting edema with history of non healing venous ulcers   Plan: I have recommended thigh high compression daily, elevation when at rest and exercise as tolerates.   She will follow up with the vein clinic for consideration of laser ablation therapy.   Roxy Horseman PA-C [facility] [place] Office: [phone]  MD in clinic [place]

## 2019-07-12 ENCOUNTER — Other Ambulatory Visit: Payer: Self-pay

## 2019-07-12 DIAGNOSIS — I6529 Occlusion and stenosis of unspecified carotid artery: Secondary | ICD-10-CM

## 2019-07-25 ENCOUNTER — Telehealth (HOSPITAL_COMMUNITY): Payer: Self-pay

## 2019-07-25 NOTE — Telephone Encounter (Signed)

## 2019-07-26 ENCOUNTER — Other Ambulatory Visit: Payer: Self-pay

## 2019-07-26 ENCOUNTER — Ambulatory Visit (HOSPITAL_COMMUNITY)
Admission: RE | Admit: 2019-07-26 | Discharge: 2019-07-26 | Disposition: A | Discharge status: Home / Self Care | Payer: MEDICARE | Source: Ambulatory Visit | Attending: Family | Admitting: Family

## 2019-07-26 DIAGNOSIS — I6529 Occlusion and stenosis of unspecified carotid artery: Secondary | ICD-10-CM | POA: Diagnosis not present

## 2019-07-27 ENCOUNTER — Encounter (HOSPITAL_COMMUNITY): Payer: MEDICARE

## 2019-08-02 DIAGNOSIS — D692 Other nonthrombocytopenic purpura: Secondary | ICD-10-CM | POA: Diagnosis not present

## 2019-08-02 DIAGNOSIS — D225 Melanocytic nevi of trunk: Secondary | ICD-10-CM | POA: Diagnosis not present

## 2019-08-02 DIAGNOSIS — L821 Other seborrheic keratosis: Secondary | ICD-10-CM | POA: Diagnosis not present

## 2019-09-01 DIAGNOSIS — Z20828 Contact with and (suspected) exposure to other viral communicable diseases: Secondary | ICD-10-CM | POA: Diagnosis not present

## 2019-09-06 ENCOUNTER — Telehealth: Payer: Self-pay | Admitting: *Deleted

## 2019-09-06 DIAGNOSIS — E876 Hypokalemia: Secondary | ICD-10-CM

## 2019-09-06 DIAGNOSIS — E039 Hypothyroidism, unspecified: Secondary | ICD-10-CM

## 2019-09-06 DIAGNOSIS — N289 Disorder of kidney and ureter, unspecified: Secondary | ICD-10-CM

## 2019-09-06 DIAGNOSIS — E785 Hyperlipidemia, unspecified: Secondary | ICD-10-CM

## 2019-09-06 DIAGNOSIS — G2581 Restless legs syndrome: Secondary | ICD-10-CM

## 2019-09-06 NOTE — Telephone Encounter (Signed)
Copied from [place] [id number]. Topic: Quick Communication - Rx Refill/Question >> Sep 06, 2019 12:30 PM Erick Blinks wrote: Reason for CRM: Pt called reporting that she has changed pharmacies. She now uses [facility]. She would like all of her prescriptions routed there. Please advise >> Sep 06, 2019 12:44 PM Richardo Priest, NT wrote: Pt called in and would like a 90 day supply for all medications allowed. Please advise.

## 2019-09-07 MED ORDER — POTASSIUM CHLORIDE CRYS ER 20 MEQ PO TBCR: 20.0000 meq | EXTENDED_RELEASE_TABLET | Freq: Two times a day (BID) | ORAL | 3 refills | Status: DC

## 2019-09-07 MED ORDER — ROPINIROLE HCL 1 MG PO TABS: 1.0000 mg | ORAL_TABLET | Freq: Every day | ORAL | 3 refills | Status: DC

## 2019-09-07 MED ORDER — LEVOTHYROXINE SODIUM 50 MCG PO TABS: 50.0000 ug | ORAL_TABLET | Freq: Every day | ORAL | 3 refills | Status: DC

## 2019-09-07 MED ORDER — FUROSEMIDE 40 MG PO TABS: 40.0000 mg | ORAL_TABLET | Freq: Every day | ORAL | 3 refills | Status: DC | PRN

## 2019-09-07 MED ORDER — SIMVASTATIN 20 MG PO TABS: 20.0000 mg | ORAL_TABLET | Freq: Every day | ORAL | 3 refills | Status: DC

## 2019-09-07 NOTE — Telephone Encounter (Signed)
New meds sent to new pharmacy

## 2019-09-20 ENCOUNTER — Telehealth: Payer: Self-pay | Admitting: *Deleted

## 2019-09-20 ENCOUNTER — Other Ambulatory Visit: Payer: Self-pay | Admitting: Family Medicine

## 2019-09-20 DIAGNOSIS — R110 Nausea: Secondary | ICD-10-CM

## 2019-09-20 DIAGNOSIS — F419 Anxiety disorder, unspecified: Secondary | ICD-10-CM

## 2019-09-20 DIAGNOSIS — B3781 Candidal esophagitis: Secondary | ICD-10-CM

## 2019-09-20 MED ORDER — FLUCONAZOLE 100 MG PO TABS: ORAL_TABLET | ORAL | 1 refills | Status: DC

## 2019-09-20 MED ORDER — ALPRAZOLAM 0.5 MG PO TABS: 0.5000 mg | ORAL_TABLET | Freq: Two times a day (BID) | ORAL | 1 refills | Status: DC | PRN

## 2019-09-20 MED ORDER — PROMETHAZINE HCL 25 MG PO TABS: 25.0000 mg | ORAL_TABLET | Freq: Two times a day (BID) | ORAL | 1 refills | Status: DC

## 2019-09-20 NOTE — Telephone Encounter (Signed)
[facility] requesting refill for alprazolam  Last written: 05/12/19 Last ov: 05/12/19 Next ov: 11/10/19 Contract: due UDS: due

## 2019-09-21 ENCOUNTER — Encounter: Payer: Self-pay | Admitting: Family Medicine

## 2019-09-21 DIAGNOSIS — M159 Polyosteoarthritis, unspecified: Secondary | ICD-10-CM

## 2019-09-21 MED ORDER — TRAMADOL HCL 50 MG PO TABS: 50.0000 mg | ORAL_TABLET | Freq: Four times a day (QID) | ORAL | 1 refills | Status: DC | PRN

## 2019-09-21 NOTE — Telephone Encounter (Signed)
Requesting: Tramadol Contract: 11/05/2017 UDS: 11/05/2017, low risk Last OV: 05/12/2019 Next OV: 11/10/2019 Last Refill: 05/12/2019, #120--1 RF Database:   Please advise

## 2019-09-23 ENCOUNTER — Ambulatory Visit: Payer: Self-pay

## 2019-09-26 ENCOUNTER — Encounter: Payer: Self-pay | Admitting: Family Medicine

## 2019-09-29 ENCOUNTER — Encounter: Payer: Self-pay | Admitting: Family Medicine

## 2019-10-01 ENCOUNTER — Ambulatory Visit: Payer: Medicare Other | Attending: Internal Medicine

## 2019-10-01 DIAGNOSIS — Z23 Encounter for immunization: Secondary | ICD-10-CM

## 2019-10-01 NOTE — Progress Notes (Signed)
   Covid-19 Vaccination Clinic  Name:  TYKISHA COURTEMANCHE    MRN: [id number] DOB: 05-06-1928  10/01/2019  Ms. Coster was observed post Covid-19 immunization for 15 minutes without incidence. She was provided with Vaccine Information Sheet and instruction to access the V-Safe system.   Ms. Bonadio was instructed to call 911 with any severe reactions post vaccine: Marland Kitchen Difficulty breathing  . Swelling of your face and throat  . A fast heartbeat  . A bad rash all over your body  . Dizziness and weakness    Immunizations Administered    Name Date Dose VIS Date Route   Pfizer COVID-19 Vaccine 10/01/2019  2:48 PM 0.3 mL 08/05/2019 Intramuscular   Manufacturer: [place]   Lot: [id number]   [place]: [id number]

## 2019-10-03 ENCOUNTER — Telehealth: Payer: Self-pay

## 2019-10-03 ENCOUNTER — Telehealth: Payer: Self-pay | Admitting: Family Medicine

## 2019-10-03 NOTE — Telephone Encounter (Signed)
Received PA form from [facility]. Form completed and faxed to [phone]. Awaiting determination.

## 2019-10-03 NOTE — Telephone Encounter (Signed)
Caller Name: Geoffrey Nedved Phone: [phone]  Pt said she received another call from her mail order on Saturday 2/6 that there are problems with Tramadol and Promethazine and they cannot fill them. She thinks the Tramadol issue is related to the qty ordered. She said the Promethazine needs authorization w/in 72 hrs.

## 2019-10-04 NOTE — Telephone Encounter (Signed)
PA approved through 10/02/2020.

## 2019-10-05 NOTE — Telephone Encounter (Signed)
Called pt's daughter. Tramadol likely to be sent out tomorrow and Xanax was already delivered.

## 2019-10-06 NOTE — Telephone Encounter (Signed)
Spoke with pt's daughter. Issue was resolved yesterday

## 2019-10-19 ENCOUNTER — Ambulatory Visit: Payer: MEDICARE | Admitting: Vascular Surgery

## 2019-10-26 ENCOUNTER — Other Ambulatory Visit: Payer: Self-pay | Admitting: Family Medicine

## 2019-10-26 ENCOUNTER — Encounter: Payer: Self-pay | Admitting: Family Medicine

## 2019-10-26 ENCOUNTER — Ambulatory Visit: Payer: Medicare Other | Attending: Internal Medicine

## 2019-10-26 ENCOUNTER — Ambulatory Visit: Payer: Medicare Other | Admitting: Vascular Surgery

## 2019-10-26 DIAGNOSIS — H40013 Open angle with borderline findings, low risk, bilateral: Secondary | ICD-10-CM | POA: Diagnosis not present

## 2019-10-26 DIAGNOSIS — Z961 Presence of intraocular lens: Secondary | ICD-10-CM | POA: Diagnosis not present

## 2019-10-26 DIAGNOSIS — H353132 Nonexudative age-related macular degeneration, bilateral, intermediate dry stage: Secondary | ICD-10-CM | POA: Diagnosis not present

## 2019-10-26 DIAGNOSIS — E039 Hypothyroidism, unspecified: Secondary | ICD-10-CM

## 2019-10-26 DIAGNOSIS — H04123 Dry eye syndrome of bilateral lacrimal glands: Secondary | ICD-10-CM | POA: Diagnosis not present

## 2019-10-26 DIAGNOSIS — E785 Hyperlipidemia, unspecified: Secondary | ICD-10-CM

## 2019-10-26 DIAGNOSIS — Z23 Encounter for immunization: Secondary | ICD-10-CM | POA: Insufficient documentation

## 2019-10-26 LAB — HM DIABETES EYE EXAM

## 2019-10-26 NOTE — Telephone Encounter (Signed)
Reassure her that we are not seeing sick people in the office but I have put the lab order in

## 2019-10-26 NOTE — Progress Notes (Signed)
   Covid-19 Vaccination Clinic  Name:  MALONNI FANTI    MRN: [id number] DOB: [birth year]  10/26/2019  Ms. Zajicek was observed post Covid-19 immunization for 15 minutes without incident. She was provided with Vaccine Information Sheet and instruction to access the V-Safe system.   Ms. Shnayder was instructed to call 911 with any severe reactions post vaccine: Marland Kitchen Difficulty breathing  . Swelling of face and throat  . A fast heartbeat  . A bad rash all over body  . Dizziness and weakness   Immunizations Administered    Name Date Dose VIS Date Route   Pfizer COVID-19 Vaccine 10/26/2019 10:28 AM 0.3 mL 08/05/2019 Intramuscular   Manufacturer: [place]   Lot: [id number]   [place]: [id number]

## 2019-10-28 ENCOUNTER — Ambulatory Visit: Payer: MEDICARE | Admitting: Vascular Surgery

## 2019-10-30 ENCOUNTER — Encounter: Payer: Self-pay | Admitting: Family Medicine

## 2019-11-03 ENCOUNTER — Encounter: Payer: Self-pay | Admitting: Cardiovascular Disease

## 2019-11-10 ENCOUNTER — Ambulatory Visit: Payer: MEDICARE | Admitting: Family Medicine

## 2019-11-11 ENCOUNTER — Other Ambulatory Visit (INDEPENDENT_AMBULATORY_CARE_PROVIDER_SITE_OTHER): Payer: Medicare Other

## 2019-11-11 DIAGNOSIS — E785 Hyperlipidemia, unspecified: Secondary | ICD-10-CM

## 2019-11-11 DIAGNOSIS — E039 Hypothyroidism, unspecified: Secondary | ICD-10-CM | POA: Diagnosis not present

## 2019-11-11 LAB — COMPREHENSIVE METABOLIC PANEL
ALT: 20 U/L (ref 0–35)
AST: 29 U/L (ref 0–37)
Albumin: 4 g/dL (ref 3.5–5.2)
Alkaline Phosphatase: 59 U/L (ref 39–117)
BUN: 15 mg/dL (ref 6–23)
CO2: 28 mEq/L (ref 19–32)
Calcium: 9.8 mg/dL (ref 8.4–10.5)
Chloride: 104 mEq/L (ref 96–112)
Creatinine, Ser: 1.03 mg/dL (ref 0.40–1.20)
GFR: 50.39 mL/min — ABNORMAL LOW (ref >60.00)
Glucose, Bld: 97 mg/dL (ref 70–99)
Potassium: 4 mEq/L (ref 3.5–5.1)
Sodium: 140 mEq/L (ref 135–145)
Total Bilirubin: 0.5 mg/dL (ref 0.2–1.2)
Total Protein: 6.7 g/dL (ref 6.0–8.3)

## 2019-11-11 LAB — LIPID PANEL
Cholesterol: 176 mg/dL (ref 0–200)
HDL: 75.7 mg/dL (ref >39.00)
LDL Cholesterol: 84 mg/dL (ref 0–99)
NonHDL: 100.05
Total CHOL/HDL Ratio: 2
Triglycerides: 78 mg/dL (ref 0.0–149.0)
VLDL: 15.6 mg/dL (ref 0.0–40.0)

## 2019-11-11 LAB — TSH: TSH: 3.16 u[IU]/mL (ref 0.35–4.50)

## 2019-12-20 ENCOUNTER — Telehealth: Payer: Self-pay | Admitting: Family Medicine

## 2019-12-20 NOTE — Progress Notes (Signed)
  Chronic Care Management   Outreach Note  12/20/2019 Name: [name] MRN: [id number] DOB: 04/19/1928  Referred by: Ann Held, DO Reason for referral : No chief complaint on file.   An unsuccessful telephone outreach was attempted today. The patient was referred to the pharmacist for assistance with care management and care coordination.    This note is not being shared with the patient for the following reason: To respect privacy (The patient or proxy has requested that the information not be shared). Follow Up Plan:   Raynicia Dukes [facility] [facility]

## 2020-01-31 ENCOUNTER — Telehealth: Payer: Self-pay | Admitting: Family Medicine

## 2020-01-31 NOTE — Progress Notes (Signed)
  Chronic Care Management   Outreach Note  01/31/2020 Name: [name] MRN: [id number] DOB: Oct 07, 1927  Referred by: Ann Held, DO Reason for referral : No chief complaint on file.   An unsuccessful telephone outreach was attempted today. The patient was referred to the pharmacist for assistance with care management and care coordination.   This note is not being shared with the patient for the following reason: To respect privacy (The patient or proxy has requested that the information not be shared).  Follow Up Plan:   Earney [place] [facility]

## 2020-02-22 ENCOUNTER — Telehealth: Payer: Self-pay | Admitting: Family Medicine

## 2020-02-22 NOTE — Progress Notes (Signed)
  Chronic Care Management   Outreach Note  02/22/2020 Name: DEZIRAY NABI MRN: [id number] DOB: [birth year]  Referred by: Ann Held, DO Reason for referral : No chief complaint on file.   Third unsuccessful telephone outreach was attempted today. The patient was referred to the pharmacist for assistance with care management and care coordination.   Follow Up Plan:   [place]

## 2020-03-01 DIAGNOSIS — Z0120 Encounter for dental examination and cleaning without abnormal findings: Secondary | ICD-10-CM | POA: Diagnosis not present

## 2020-05-17 ENCOUNTER — Encounter: Payer: Medicare Other | Admitting: Family Medicine

## 2020-05-17 ENCOUNTER — Telehealth: Payer: Self-pay | Admitting: Family Medicine

## 2020-05-17 ENCOUNTER — Ambulatory Visit: Payer: MEDICARE | Admitting: *Deleted

## 2020-05-17 ENCOUNTER — Other Ambulatory Visit: Payer: Self-pay | Admitting: Family Medicine

## 2020-05-17 DIAGNOSIS — E876 Hypokalemia: Secondary | ICD-10-CM

## 2020-05-17 DIAGNOSIS — N289 Disorder of kidney and ureter, unspecified: Secondary | ICD-10-CM

## 2020-05-17 DIAGNOSIS — R110 Nausea: Secondary | ICD-10-CM

## 2020-05-17 DIAGNOSIS — E785 Hyperlipidemia, unspecified: Secondary | ICD-10-CM

## 2020-05-17 DIAGNOSIS — G2581 Restless legs syndrome: Secondary | ICD-10-CM

## 2020-05-17 DIAGNOSIS — D508 Other iron deficiency anemias: Secondary | ICD-10-CM

## 2020-05-17 DIAGNOSIS — E039 Hypothyroidism, unspecified: Secondary | ICD-10-CM

## 2020-05-17 MED ORDER — SIMVASTATIN 20 MG PO TABS: 20.0000 mg | ORAL_TABLET | Freq: Every day | ORAL | 3 refills | Status: DC

## 2020-05-17 MED ORDER — LEVOTHYROXINE SODIUM 50 MCG PO TABS: 50.0000 ug | ORAL_TABLET | Freq: Every day | ORAL | 3 refills | Status: DC

## 2020-05-17 MED ORDER — POTASSIUM CHLORIDE CRYS ER 20 MEQ PO TBCR: 20.0000 meq | EXTENDED_RELEASE_TABLET | Freq: Two times a day (BID) | ORAL | 3 refills | Status: DC

## 2020-05-17 MED ORDER — PROMETHAZINE HCL 25 MG PO TABS: 25.0000 mg | ORAL_TABLET | Freq: Two times a day (BID) | ORAL | 1 refills | Status: DC

## 2020-05-17 MED ORDER — ROPINIROLE HCL 1 MG PO TABS: 1.0000 mg | ORAL_TABLET | Freq: Every day | ORAL | 3 refills | Status: DC

## 2020-05-17 MED ORDER — FUROSEMIDE 40 MG PO TABS: 40.0000 mg | ORAL_TABLET | Freq: Every day | ORAL | 3 refills | Status: DC | PRN

## 2020-05-17 NOTE — Telephone Encounter (Signed)
Please advise which labs need to be ordered

## 2020-05-17 NOTE — Telephone Encounter (Signed)
Refills sent

## 2020-05-17 NOTE — Telephone Encounter (Signed)
Patient requesting labs placed before her physical on 06/21/2020. Patient r/s from 05/17/2020.

## 2020-05-17 NOTE — Telephone Encounter (Signed)
Pt is needing refill on all her meds (pt had a visit on 05-17-2020 but provider could not see pt in office- personal situation with provider) If pt needs lab please send orders to [place] office and inform Pt's daughter at tel [phone].

## 2020-05-17 NOTE — Telephone Encounter (Signed)
Patient is requesting lab placed at [facility] office.

## 2020-05-17 NOTE — Telephone Encounter (Signed)
Lab order in!

## 2020-05-17 NOTE — Telephone Encounter (Signed)
Pt also mentioned to please send refills to: [facility] [place], [address] at [employer].

## 2020-05-18 ENCOUNTER — Other Ambulatory Visit: Payer: Self-pay

## 2020-05-18 ENCOUNTER — Other Ambulatory Visit: Payer: Self-pay | Admitting: Family Medicine

## 2020-05-18 DIAGNOSIS — M159 Polyosteoarthritis, unspecified: Secondary | ICD-10-CM

## 2020-05-18 MED ORDER — TRAMADOL HCL 50 MG PO TABS: 50.0000 mg | ORAL_TABLET | Freq: Four times a day (QID) | ORAL | 1 refills | Status: DC | PRN

## 2020-05-18 NOTE — Telephone Encounter (Signed)
Advised Pt's daughter that labs have been placed. Labs also changed to [facility]

## 2020-05-18 NOTE — Progress Notes (Signed)
Refill sent.

## 2020-05-18 NOTE — Addendum Note (Signed)
Addended by: Sanda Linger on: 05/18/2020 04:28 PM   Modules accepted: Orders

## 2020-05-21 ENCOUNTER — Encounter: Payer: Self-pay | Admitting: Family Medicine

## 2020-05-21 DIAGNOSIS — B3781 Candidal esophagitis: Secondary | ICD-10-CM

## 2020-05-21 DIAGNOSIS — F419 Anxiety disorder, unspecified: Secondary | ICD-10-CM

## 2020-05-22 ENCOUNTER — Other Ambulatory Visit: Payer: Self-pay | Admitting: Family Medicine

## 2020-05-22 DIAGNOSIS — F419 Anxiety disorder, unspecified: Secondary | ICD-10-CM

## 2020-05-22 MED ORDER — ALPRAZOLAM 0.5 MG PO TABS: 0.5000 mg | ORAL_TABLET | Freq: Two times a day (BID) | ORAL | 0 refills | Status: DC | PRN

## 2020-05-22 MED ORDER — FLUCONAZOLE 100 MG PO TABS: ORAL_TABLET | ORAL | 1 refills | Status: DC

## 2020-05-22 NOTE — Telephone Encounter (Signed)
Med sent.

## 2020-05-22 NOTE — Telephone Encounter (Signed)
Alprazolam refill.   Last OV: 05/12/2019, appt scheduled 06/21/2020 Last Fill: 09/20/2019 #180 and 1RF Pt sig: 1 tab bid prn UDS: 11/05/2017 Low risk

## 2020-06-11 ENCOUNTER — Other Ambulatory Visit (INDEPENDENT_AMBULATORY_CARE_PROVIDER_SITE_OTHER): Payer: Medicare Other

## 2020-06-11 DIAGNOSIS — E039 Hypothyroidism, unspecified: Secondary | ICD-10-CM | POA: Diagnosis not present

## 2020-06-11 DIAGNOSIS — E785 Hyperlipidemia, unspecified: Secondary | ICD-10-CM | POA: Diagnosis not present

## 2020-06-11 DIAGNOSIS — D508 Other iron deficiency anemias: Secondary | ICD-10-CM | POA: Diagnosis not present

## 2020-06-11 LAB — CBC WITH DIFFERENTIAL/PLATELET
Basophils Absolute: 0 10*3/uL (ref 0.0–0.1)
Basophils Relative: 0.4 % (ref 0.0–3.0)
Eosinophils Absolute: 0 10*3/uL (ref 0.0–0.7)
Eosinophils Relative: 0.9 % (ref 0.0–5.0)
HCT: 36.3 % (ref 36.0–46.0)
Hemoglobin: 11.9 g/dL — ABNORMAL LOW (ref 12.0–15.0)
Lymphocytes Relative: 33.2 % (ref 12.0–46.0)
Lymphs Abs: 1.8 10*3/uL (ref 0.7–4.0)
MCHC: 32.9 g/dL (ref 30.0–36.0)
MCV: 94.8 fl (ref 78.0–100.0)
Monocytes Absolute: 0.4 10*3/uL (ref 0.1–1.0)
Monocytes Relative: 6.6 % (ref 3.0–12.0)
Neutro Abs: 3.2 10*3/uL (ref 1.4–7.7)
Neutrophils Relative %: 58.9 % (ref 43.0–77.0)
Platelets: 227 10*3/uL (ref 150.0–400.0)
RBC: 3.83 Mil/uL — ABNORMAL LOW (ref 3.87–5.11)
RDW: 12.7 % (ref 11.5–15.5)
WBC: 5.5 10*3/uL (ref 4.0–10.5)

## 2020-06-11 LAB — COMPREHENSIVE METABOLIC PANEL
ALT: 23 U/L (ref 0–35)
AST: 32 U/L (ref 0–37)
Albumin: 3.7 g/dL (ref 3.5–5.2)
Alkaline Phosphatase: 59 U/L (ref 39–117)
BUN: 6 mg/dL (ref 6–23)
CO2: 27 mEq/L (ref 19–32)
Calcium: 9.2 mg/dL (ref 8.4–10.5)
Chloride: 106 mEq/L (ref 96–112)
Creatinine, Ser: 0.83 mg/dL (ref 0.40–1.20)
GFR: 62.02 mL/min (ref >60.00)
Glucose, Bld: 88 mg/dL (ref 70–99)
Potassium: 4.8 mEq/L (ref 3.5–5.1)
Sodium: 138 mEq/L (ref 135–145)
Total Bilirubin: 0.5 mg/dL (ref 0.2–1.2)
Total Protein: 6.1 g/dL (ref 6.0–8.3)

## 2020-06-11 LAB — LIPID PANEL
Cholesterol: 148 mg/dL (ref 0–200)
HDL: 66.7 mg/dL (ref >39.00)
LDL Cholesterol: 65 mg/dL (ref 0–99)
NonHDL: 80.85
Total CHOL/HDL Ratio: 2
Triglycerides: 77 mg/dL (ref 0.0–149.0)
VLDL: 15.4 mg/dL (ref 0.0–40.0)

## 2020-06-11 LAB — TSH: TSH: 0.88 u[IU]/mL (ref 0.35–4.50)

## 2020-06-11 NOTE — Addendum Note (Signed)
Addended by: Eddie North C on: 06/11/2020 07:30 AM   Modules accepted: Orders

## 2020-06-15 ENCOUNTER — Other Ambulatory Visit: Payer: Self-pay | Admitting: Family Medicine

## 2020-06-15 DIAGNOSIS — D509 Iron deficiency anemia, unspecified: Secondary | ICD-10-CM

## 2020-06-18 ENCOUNTER — Other Ambulatory Visit: Payer: Self-pay

## 2020-06-18 ENCOUNTER — Encounter: Payer: Self-pay | Admitting: Family Medicine

## 2020-06-18 DIAGNOSIS — D649 Anemia, unspecified: Secondary | ICD-10-CM

## 2020-06-19 NOTE — Telephone Encounter (Signed)
While there is a change it is still in the normal range I would not change med at this time but we can certainly recheck tsh in a month as well

## 2020-06-19 NOTE — Addendum Note (Signed)
Addended by: Kem Boroughs D on: 06/19/2020 09:48 AM   Modules accepted: Orders

## 2020-06-21 ENCOUNTER — Ambulatory Visit (INDEPENDENT_AMBULATORY_CARE_PROVIDER_SITE_OTHER): Payer: Medicare Other | Admitting: Family Medicine

## 2020-06-21 ENCOUNTER — Other Ambulatory Visit: Payer: Self-pay

## 2020-06-21 ENCOUNTER — Other Ambulatory Visit: Payer: Self-pay | Admitting: *Deleted

## 2020-06-21 ENCOUNTER — Encounter: Payer: Self-pay | Admitting: Family Medicine

## 2020-06-21 VITALS — BP 132/64 | HR 49 | Temp 98.1°F | Resp 18 | Ht 62.5 in | Wt 85.4 lb

## 2020-06-21 DIAGNOSIS — Z0000 Encounter for general adult medical examination without abnormal findings: Secondary | ICD-10-CM

## 2020-06-21 DIAGNOSIS — E039 Hypothyroidism, unspecified: Secondary | ICD-10-CM | POA: Diagnosis not present

## 2020-06-21 DIAGNOSIS — M810 Age-related osteoporosis without current pathological fracture: Secondary | ICD-10-CM | POA: Diagnosis not present

## 2020-06-21 DIAGNOSIS — E785 Hyperlipidemia, unspecified: Secondary | ICD-10-CM

## 2020-06-21 DIAGNOSIS — D509 Iron deficiency anemia, unspecified: Secondary | ICD-10-CM | POA: Diagnosis not present

## 2020-06-21 DIAGNOSIS — N2889 Other specified disorders of kidney and ureter: Secondary | ICD-10-CM

## 2020-06-21 NOTE — Patient Instructions (Signed)
Preventive Care 84 Years and Older, Female Preventive care refers to lifestyle choices and visits with your health care provider that can promote health and wellness. This includes:  A yearly physical exam. This is also called an annual well check.  Regular dental and eye exams.  Immunizations.  Screening for certain conditions.  Healthy lifestyle choices, such as diet and exercise. What can I expect for my preventive care visit? Physical exam Your health care provider will check:  Height and weight. These may be used to calculate body mass index (BMI), which is a measurement that tells if you are at a healthy weight.  Heart rate and blood pressure.  Your skin for abnormal spots. Counseling Your health care provider may ask you questions about:  Alcohol, tobacco, and drug use.  Emotional well-being.  Home and relationship well-being.  Sexual activity.  Eating habits.  History of falls.  Memory and ability to understand (cognition).  Work and work Statistician.  Pregnancy and menstrual history. What immunizations do I need?  Influenza (flu) vaccine  This is recommended every year. Tetanus, diphtheria, and pertussis (Tdap) vaccine  You may need a Td booster every 10 years. Varicella (chickenpox) vaccine  You may need this vaccine if you have not already been vaccinated. Zoster (shingles) vaccine  You may need this after age 84. Pneumococcal conjugate (PCV13) vaccine  One dose is recommended after age 84. Pneumococcal polysaccharide (PPSV23) vaccine  One dose is recommended after age 84. Measles, mumps, and rubella (MMR) vaccine  You may need at least one dose of MMR if you were born in 1957 or later. You may also need a second dose. Meningococcal conjugate (MenACWY) vaccine  You may need this if you have certain conditions. Hepatitis A vaccine  You may need this if you have certain conditions or if you travel or work in places where you may be exposed  to hepatitis A. Hepatitis B vaccine  You may need this if you have certain conditions or if you travel or work in places where you may be exposed to hepatitis B. Haemophilus influenzae type b (Hib) vaccine  You may need this if you have certain conditions. You may receive vaccines as individual doses or as more than one vaccine together in one shot (combination vaccines). Talk with your health care provider about the risks and benefits of combination vaccines. What tests do I need? Blood tests  Lipid and cholesterol levels. These may be checked every 5 years, or more frequently depending on your overall health.  Hepatitis C test.  Hepatitis B test. Screening  Lung cancer screening. You may have this screening every year starting at age 84 if you have a 30-pack-year history of smoking and currently smoke or have quit within the past 15 years.  Colorectal cancer screening. All adults should have this screening starting at age 36 and continuing until age 84. Your health care provider may recommend screening at age 84 if you are at increased risk. You will have tests every 1-10 years, depending on your results and the type of screening test.  Diabetes screening. This is done by checking your blood sugar (glucose) after you have not eaten for a while (fasting). You may have this done every 1-3 years.  Mammogram. This may be done every 1-2 years. Talk with your health care provider about how often you should have regular mammograms.  BRCA-related cancer screening. This may be done if you have a family history of breast, ovarian, tubal, or peritoneal cancers.  Other tests  Sexually transmitted disease (STD) testing.  Bone density scan. This is done to screen for osteoporosis. You may have this done starting at age 84. Follow these instructions at home: Eating and drinking  Eat a diet that includes fresh fruits and vegetables, whole grains, lean protein, and low-fat dairy products. Limit  your intake of foods with high amounts of sugar, saturated fats, and salt.  Take vitamin and mineral supplements as recommended by your health care provider.  Do not drink alcohol if your health care provider tells you not to drink.  If you drink alcohol: ? Limit how much you have to 0-1 drink a day. ? Be aware of how much alcohol is in your drink. In the [place] one drink equals one 12 oz bottle of beer (355 mL), one 5 oz glass of wine (148 mL), or one 1 oz glass of hard liquor (44 mL). Lifestyle  Take daily care of your teeth and gums.  Stay active. Exercise for at least 30 minutes on 5 or more days each week.  Do not use any products that contain nicotine or tobacco, such as cigarettes, e-cigarettes, and chewing tobacco. If you need help quitting, ask your health care provider.  If you are sexually active, practice safe sex. Use a condom or other form of protection in order to prevent STIs (sexually transmitted infections).  Talk with your health care provider about taking a low-dose aspirin or statin. What's next?  Go to your health care provider once a year for a well check visit.  Ask your health care provider how often you should have your eyes and teeth checked.  Stay up to date on all vaccines. This information is not intended to replace advice given to you by your health care provider. Make sure you discuss any questions you have with your health care provider. Document Revised: 08/05/2018 Document Reviewed: 08/05/2018 Elsevier Patient Education  2020 [employer].

## 2020-06-21 NOTE — Addendum Note (Signed)
Addended by: Kelle Darting A on: 06/21/2020 10:26 AM   Modules accepted: Orders

## 2020-06-21 NOTE — Progress Notes (Signed)
Subjective:     [name] is a 86 y.o. female and is here for a comprehensive physical exam. The patient reports no problems.  Her daughter is with her.  She just had a 59 birthday party and family from [place] and [place] came.      Social History   Socioeconomic History  . Marital status: Widowed    Spouse name: Not on file  . Number of children: 2  . Years of education: Not on file  . Highest education level: Not on file  Occupational History  . Occupation: Retired    Fish farm manager: RETIRED  Tobacco Use  . Smoking status: Former Research scientist (life sciences)  . Smokeless tobacco: Never Used  Substance and Sexual Activity  . Alcohol use: No  . Drug use: No  . Sexual activity: Not Currently  Other Topics Concern  . Not on file  Social History Narrative   REG EXERCISE   WIDOW   LIVES ALONE   END OF LIFE:PATIENT HAS LIVING WILL AND CLEARLY STATES SHE DOES NOT WANT CARDIAC [place] VENTILATION OR OTHER HEROIC OR FUTILE MEASURES.   Social Determinants of Health   Financial Resource Strain:   . Difficulty of Paying Living Expenses: Not on file  Food Insecurity:   . Worried About Charity fundraiser in the Last Year: Not on file  . Ran Out of Food in the Last Year: Not on file  Transportation Needs:   . Lack of Transportation (Medical): Not on file  . Lack of Transportation (Non-Medical): Not on file  Physical Activity:   . Days of Exercise per Week: Not on file  . Minutes of Exercise per Session: Not on file  Stress:   . Feeling of Stress : Not on file  Social Connections:   . Frequency of Communication with Friends and Family: Not on file  . Frequency of Social Gatherings with Friends and Family: Not on file  . Attends Religious Services: Not on file  . Active Member of Clubs or Organizations: Not on file  . Attends Archivist Meetings: Not on file  . Marital Status: Not on file  Intimate Partner Violence:   . Fear of Current or Ex-Partner: Not on file  . Emotionally  Abused: Not on file  . Physically Abused: Not on file  . Sexually Abused: Not on file   Health Maintenance  Topic Date Due  . TETANUS/TDAP  06/08/2026  . INFLUENZA VACCINE  Completed  . DEXA SCAN  Completed  . COVID-19 Vaccine  Completed  . PNA vac Low Risk Adult  Completed    The following portions of the patient's history were reviewed and updated as appropriate:  She  has a past medical history of Allergy, Anemia, Anxiety, Cataract (05/2011), Chronic low blood pressure, Depression, Esophageal disorder, Esophageal yeast infection ([place]), GERD (gastroesophageal reflux disease), Hearing loss, Hyperlipidemia, Hypothyroidism, Myocardial infarction ([place]), Osteoporosis, Renal cell carcinoma ([place]), Thyroid disease, and Transfusion history. She does not have any pertinent problems on file. She  has a past surgical history that includes obstructed ureter (Left); Total abdominal hysterectomy w/ bilateral salpingoophorectomy; Kyphosis surgery; Tonsillectomy; Appendectomy; Colonoscopy; Coronary artery bypass graft; Renal cryoablation (Right, 09/2014); ir generic historical (08/29/2014); ir generic historical (10/22/2016); IR Radiologist Eval & Mgmt (11/03/2017); Blepharoplasty (Right); EUS (N/A, 02/04/2018); Fine needle aspiration (N/A, 02/04/2018); and IR Radiologist Eval & Mgmt (02/15/2019). Her family history includes Breast cancer in her sister and another family member; Heart disease in an other family member; Hyperlipidemia in an other  family member; Hypertension in an other family member; Parkinsonism in her father. She  reports that she has quit smoking. She has never used smokeless tobacco. She reports that she does not drink alcohol and does not use drugs. She has a current medication list which includes the following prescription(s): alprazolam, aspirin, vitamin d3, ferrous fumarate, fluconazole, fluticasone, folic acid, furosemide, levofloxacin, levothyroxine, loratadine, multivitamin with minerals,  potassium chloride sa, promethazine, ranitidine, ropinirole, simvastatin, tizanidine, and tramadol. Current Outpatient Medications on File Prior to Visit  Medication Sig Dispense Refill  . ALPRAZolam (XANAX) 0.5 MG tablet Take 1 tablet (0.5 mg total) by mouth 2 (two) times daily as needed for anxiety. 180 tablet 0  . aspirin 81 MG EC tablet Take 81 mg by mouth daily.      . Cholecalciferol (VITAMIN D3) 1000 UNITS CAPS Take 1,000 Units by mouth daily.     . ferrous fumarate (FERRETTS) 325 (106 FE) MG TABS Take 1 tablet by mouth daily.     . fluconazole (DIFLUCAN) 100 MG tablet TAKE 1 TABLET BY MOUTH  DAILY AS NEEDED FOR YEAST  INFECTION 90 tablet 1  . fluticasone (FLONASE) 50 MCG/ACT nasal spray Place 2 sprays into both nostrils daily as needed for allergies or rhinitis.    . folic acid (FOLVITE) 1 MG tablet Take 1 mg by mouth daily.      . furosemide (LASIX) 40 MG tablet Take 1 tablet (40 mg total) by mouth daily as needed. 90 tablet 3  . levofloxacin (LEVAQUIN) 500 MG tablet Take 1 tablet (500 mg total) by mouth daily. 7 tablet 0  . levothyroxine (SYNTHROID) 50 MCG tablet Take 1 tablet (50 mcg total) by mouth daily. 90 tablet 3  . loratadine (CLARITIN) 10 MG tablet Take 10 mg by mouth daily.    . Multiple Vitamin (MULTIVITAMIN WITH MINERALS) TABS tablet Take 1 tablet by mouth daily.    . potassium chloride SA (KLOR-CON) 20 MEQ tablet Take 1 tablet (20 mEq total) by mouth 2 (two) times daily. 180 tablet 3  . promethazine (PHENERGAN) 25 MG tablet Take 1 tablet (25 mg total) by mouth 2 (two) times daily. 180 tablet 1  . ranitidine (ZANTAC) 150 MG tablet Take 150 mg by mouth daily.     Marland Kitchen rOPINIRole (REQUIP) 1 MG tablet Take 1 tablet (1 mg total) by mouth at bedtime. 90 tablet 3  . simvastatin (ZOCOR) 20 MG tablet Take 1 tablet (20 mg total) by mouth daily. 90 tablet 3  . tiZANidine (ZANAFLEX) 2 MG tablet Take 2 mg by mouth daily.     . traMADol (ULTRAM) 50 MG tablet Take 1 tablet (50 mg total) by  mouth every 6 (six) hours as needed. for pain 120 tablet 1   No current facility-administered medications on file prior to visit.   She is allergic to zofran [ondansetron hcl], clarithromycin, codeine, diazepam, sulfonamide derivatives, prilosec [omeprazole magnesium], and trazodone..  Review of Systems  Review of Systems  Constitutional: Negative for activity change, appetite change and fatigue.  HENT: Negative for hearing loss, congestion, tinnitus and ear discharge.   Eyes: Negative for visual disturbance (see optho q1y -- vision corrected to 20/20 with glasses).  Respiratory: Negative for cough, chest tightness and shortness of breath.   Cardiovascular: Negative for chest pain, palpitations and leg swelling.  Gastrointestinal: Negative for abdominal pain, diarrhea, constipation and abdominal distention.  Genitourinary: Negative for urgency, frequency, decreased urine volume and difficulty urinating.  Musculoskeletal: Negative for back pain, arthralgias and gait problem.  Skin: Negative for color change, pallor and rash.  Neurological: Negative for dizziness, light-headedness, numbness and headaches.  Hematological: Negative for adenopathy. Does not bruise/bleed easily.  Psychiatric/Behavioral: Negative for suicidal ideas, confusion, sleep disturbance, self-injury, dysphoric mood, decreased concentration and agitation.  Pt is able to read and write and can do all ADLs No risk for falling No abuse/ violence in home     Objective:    BP 132/64 (BP Location: Right Arm, Patient Position: Sitting, Cuff Size: Small)   Pulse (!) 49   Temp 98.1 F (36.7 C) (Oral)   Resp 18   Ht 5' 2.5" (1.588 m)   Wt 85 lb 6.4 oz (38.7 kg)   SpO2 100%   BMI 15.37 kg/m  General appearance: alert, cooperative and no distress Head: Normocephalic, without obvious abnormality, atraumatic Eyes: negative findings: lids and lashes normal, conjunctivae and sclerae normal and pupils equal, round, reactive  to light and accomodation Ears: normal TM's and external ear canals both ears Neck: no adenopathy, no carotid bruit, no JVD, supple, symmetrical, trachea midline and thyroid not enlarged, symmetric, no tenderness/mass/nodules Back: symmetric, no curvature. ROM normal. No CVA tenderness. Lungs: clear to auscultation bilaterally : Heart: regular rate and rhythm, S1, S2 normal, no murmur, click, rub or gallop Abdomen: soft, non-tender; bowel sounds normal; no masses,  no organomegaly Pelvic: deferred Extremities: extremities normal, atraumatic, no cyanosis or edema Pulses: 2+ and symmetric Skin: Skin color, texture, turgor normal. No rashes or lesions Lymph nodes: Cervical, supraclavicular, and axillary nodes normal. Neurologic: Alert and oriented X 3, normal strength and tone. Normal symmetric reflexes. Normal coordination and gait    Assessment:    Healthy female exam.      Plan:    ghm utd Labs reviewed  See After Visit Summary for Counseling Recommendations     1. Iron deficiency anemia, unspecified iron deficiency anemia type Recheck today con't mvi with iron  - CBC with Differential/Platelet - Iron, TIBC and Ferritin Panel  2. Hypothyroidism, unspecified type Stable   3. Hyperlipidemia, unspecified hyperlipidemia type Tolerating statin, encouraged heart healthy diet, avoid trans fats, minimize simple carbs and saturated fats. Increase exercise as tolerated  4. Osteoporosis, unspecified osteoporosis type, unspecified pathological fracture presence Pt does not want to do bmd anymore   5. Right renal mass   6. Preventative health care See above

## 2020-06-24 ENCOUNTER — Encounter: Payer: Self-pay | Admitting: Family Medicine

## 2020-06-24 LAB — CBC WITH DIFFERENTIAL/PLATELET

## 2020-06-24 LAB — IRON,TIBC AND FERRITIN PANEL
%SAT: 19 % (calc) (ref 16–45)
Ferritin: 289 ng/mL — ABNORMAL HIGH (ref 16–288)
Iron: 54 ug/dL (ref 45–160)
TIBC: 284 mcg/dL (calc) (ref 250–450)

## 2020-06-24 NOTE — Assessment & Plan Note (Signed)
Encouraged heart healthy diet, increase exercise, avoid trans fats, consider a krill oil cap daily Lab Results  Component Value Date   CHOL 148 06/11/2020   HDL 66.70 06/11/2020   LDLCALC 65 06/11/2020   LDLDIRECT 165.1 05/10/2008   TRIG 77.0 06/11/2020   CHOLHDL 2 06/11/2020

## 2020-06-24 NOTE — Assessment & Plan Note (Signed)
Stable con't meds Labs reviewed Lab Results  Component Value Date   TSH 0.88 06/11/2020

## 2020-06-24 NOTE — Assessment & Plan Note (Signed)
Due to age pt does not want to do bmd any more

## 2020-06-24 NOTE — Assessment & Plan Note (Addendum)
Pain controlled with muscle relaxer and ultram Uds, contract and database reviewed

## 2020-06-25 ENCOUNTER — Telehealth: Payer: Self-pay | Admitting: *Deleted

## 2020-06-25 DIAGNOSIS — D509 Iron deficiency anemia, unspecified: Secondary | ICD-10-CM

## 2020-06-25 NOTE — Telephone Encounter (Signed)
Spoke with pt's daughter and apologized for missing the cbc order. She states she will take pt to the [facility] lab and future order has been placed.

## 2020-06-26 ENCOUNTER — Other Ambulatory Visit (INDEPENDENT_AMBULATORY_CARE_PROVIDER_SITE_OTHER): Payer: Medicare Other

## 2020-06-26 ENCOUNTER — Encounter: Payer: Self-pay | Admitting: Family Medicine

## 2020-06-26 ENCOUNTER — Telehealth: Payer: Self-pay

## 2020-06-26 ENCOUNTER — Other Ambulatory Visit: Payer: Self-pay | Admitting: Family Medicine

## 2020-06-26 DIAGNOSIS — D509 Iron deficiency anemia, unspecified: Secondary | ICD-10-CM

## 2020-06-26 DIAGNOSIS — D649 Anemia, unspecified: Secondary | ICD-10-CM | POA: Diagnosis not present

## 2020-06-26 DIAGNOSIS — R7989 Other specified abnormal findings of blood chemistry: Secondary | ICD-10-CM

## 2020-06-26 LAB — CBC WITH DIFFERENTIAL/PLATELET
Basophils Absolute: 0 10*3/uL (ref 0.0–0.1)
Basophils Relative: 0.5 % (ref 0.0–3.0)
Eosinophils Absolute: 0.1 10*3/uL (ref 0.0–0.7)
Eosinophils Relative: 0.9 % (ref 0.0–5.0)
HCT: 34.3 % — ABNORMAL LOW (ref 36.0–46.0)
Hemoglobin: 11.5 g/dL — ABNORMAL LOW (ref 12.0–15.0)
Lymphocytes Relative: 35.9 % (ref 12.0–46.0)
Lymphs Abs: 2.4 10*3/uL (ref 0.7–4.0)
MCHC: 33.6 g/dL (ref 30.0–36.0)
MCV: 93.6 fl (ref 78.0–100.0)
Monocytes Absolute: 0.6 10*3/uL (ref 0.1–1.0)
Monocytes Relative: 9 % (ref 3.0–12.0)
Neutro Abs: 3.7 10*3/uL (ref 1.4–7.7)
Neutrophils Relative %: 53.7 % (ref 43.0–77.0)
Platelets: 264 10*3/uL (ref 150.0–400.0)
RBC: 3.66 Mil/uL — ABNORMAL LOW (ref 3.87–5.11)
RDW: 12.7 % (ref 11.5–15.5)
WBC: 6.8 10*3/uL (ref 4.0–10.5)

## 2020-06-26 LAB — FECAL OCCULT BLOOD, IMMUNOCHEMICAL: Fecal Occult Bld: POSITIVE — AB

## 2020-06-26 NOTE — Telephone Encounter (Signed)
Received call from Quentin Ore from [place] lab to report a positive Ifob on [name]

## 2020-06-26 NOTE — Telephone Encounter (Signed)
Needs gi referral for guaic + stool Find out from pt daughter if Froid is ok

## 2020-06-26 NOTE — Telephone Encounter (Signed)
I ordered it for feb

## 2020-06-26 NOTE — Telephone Encounter (Signed)
Please advise 

## 2020-06-27 ENCOUNTER — Other Ambulatory Visit: Payer: Self-pay | Admitting: *Deleted

## 2020-06-27 DIAGNOSIS — K921 Melena: Secondary | ICD-10-CM

## 2020-06-27 NOTE — Telephone Encounter (Signed)
Referral placed and see patient daughter viewed through [place].

## 2020-07-02 NOTE — Telephone Encounter (Signed)
Can you check on this referral 

## 2020-07-10 ENCOUNTER — Ambulatory Visit: Payer: Medicare Other | Admitting: Gastroenterology

## 2020-07-10 ENCOUNTER — Encounter: Payer: Self-pay | Admitting: Gastroenterology

## 2020-07-10 VITALS — BP 98/60 | Ht 62.5 in | Wt 87.0 lb

## 2020-07-10 DIAGNOSIS — R195 Other fecal abnormalities: Secondary | ICD-10-CM | POA: Diagnosis not present

## 2020-07-10 NOTE — Patient Instructions (Addendum)
If you are age 84 or older, your body mass index should be between 23-30. Your Body mass index is 15.66 kg/m. If this is out of the aforementioned range listed, please consider follow up with your Primary Care Provider.  If you are age 34 or younger, your body mass index should be between 19-25. Your Body mass index is 15.66 kg/m. If this is out of the aformentioned range listed, please consider follow up with your Primary Care Provider.   We will review your blood work once the results are in the system.  You will follow up with our office on an as needed basis.  Thank you for entrusting me with your care and choosing [facility].  Dr Ardis Hughs

## 2020-07-10 NOTE — Progress Notes (Signed)
Review of pertinent gastrointestinal problems:  1. Adenomatous polyp: Colonoscopy January 2012 (age 86) done for fecal occult positive stool, 14 mm adenoma was removed in piecemeal fashion. I felt was completely resected and did not require any further surveillance given her age.  2. GERD:Had allergic (welts, rash) reaction to prilosec ; EGD 12/2013Esophageal yeast infectionMild to moderate pan-gastritis, biopsied to check for H. pylori The examination was otherwise normal; biopsies showed chronic gastritis, no H. Pylori. May, 2014 doing well on twice daily H2 blocker, bedtime Reglan. 02/2013 called with recurrent dysphagia, diflucan restarted empirically. 12/2013 intermittent diflucan treatment helps her swallowing. She started soaking her dentures in nystatin every 7-10 days. EUS 01/2018 found yeast esophagitis again. 3. Incidental pancreatic cyst: No concerning morphologic features based on 2015 AGA criteria. Regardless, EUS 01/2018 found two innocent appearing pancreatic head cysts, one was sampled and fluid testing showed: CEA 640 ng/mL (elevated)  Amylase 286 U/L  Cytology: no sign of cancer. Recommended continued imaging surveillance (which she gets anyway for renal cancer.  HPI: This is a very pleasant 86 year old woman who was referred to me by Carollee Herter, Alferd Apa, *  to evaluate Hemoccult positive anemia.  I last saw her a little over a year ago here in the office.  She was having intermittent dysphagia again which I thought was probably from recurrent yeast infection.  She was quite frail, elderly and I recommended empiric treatment rather than further invasive testing.  Likewise we discussed her pancreatic cyst which was incidental without any concerning morphologic features.  I did not think that this needed to be evaluated any further given her advanced age, frailty.  She is now referred for a new problem, slight anemia, hemoglobin 11.02 June 2020, normocytic.  Repeat CBC November 2021  hemoglobin 10.5, normocytic.  Hemoccult positive stool.  Ferritin 289 which is elevated, normal iron and normal iron saturation.  She is here with her daughter today.  She has a cane that she uses when she leaves the house.  She uses walkers to get around the house.  She eats a lot of candy.  Her weight is overall stable.  She has had no changes in her bowels recently.  She does not see any overt GI bleeding.  She is having no significant abdominal pains.  Colon cancer does not run in her family  Review of systems: Pertinent positive and negative review of systems were noted in the above HPI section. All other review negative.   Past Medical History:  Diagnosis Date   Allergy    Anemia    Anxiety    Cataract 05/2011   Chronic low blood pressure    due to dehydration   Depression    Esophageal disorder    Esophageal yeast infection (HCC)    GERD (gastroesophageal reflux disease)    Hearing loss    hearing aids   Hyperlipidemia    Hypothyroidism    Myocardial infarction [facility])    Osteoporosis    Renal cell carcinoma ([place])    found on CT   Thyroid disease    hypothyroidism   Transfusion history    last 2004    Past Surgical History:  Procedure Laterality Date   APPENDECTOMY     BLEPHAROPLASTY Right    x 3 (all related to basal cell carcinoma)   COLONOSCOPY     CORONARY ARTERY BYPASS GRAFT     x2 vessel bypass -Dr. Burt Knack -[facility] [place] 06-22-14   EUS N/A 02/04/2018   Procedure: UPPER  ENDOSCOPIC ULTRASOUND (EUS) RADIAL;  Surgeon: Milus Banister, MD;  Location: [facility] END[facility]COPY;  Service: Endoscopy;  Laterality: N/A;   FINE NEEDLE ASPIRATION N/A 02/04/2018   Procedure: FINE NEEDLE ASPIRATION (FNA) LINEAR;  Surgeon: Milus Banister, MD;  Location: [facility] END[facility];  Service: Endoscopy;  Laterality: N/A;   IR GENERIC HISTORICAL  08/29/2014   IR RADIOLOGIST EVAL & MGMT 08/29/2014 Aletta Edouard, MD [facility] INTERV RAD   IR GENERIC HISTORICAL  10/22/2016   IR  RADIOLOGIST EVAL & MGMT 10/22/2016 Aletta Edouard, MD [facility] INTERV RAD   IR RADIOLOGIST EVAL & MGMT  11/03/2017   IR RADIOLOGIST EVAL & MGMT  02/15/2019   KYPHOSIS SURGERY     obstructed ureter Left    repair-operative   RENAL CRYOABLATION Right 09/2014   TONSILLECTOMY     TOTAL ABDOMINAL HYSTERECTOMY W/ BILATERAL SALPINGOOPHORECTOMY      Current Outpatient Medications  Medication Sig Dispense Refill   ALPRAZolam (XANAX) 0.5 MG tablet Take 1 tablet (0.5 mg total) by mouth 2 (two) times daily as needed for anxiety. 180 tablet 0   aspirin 81 MG EC tablet Take 81 mg by mouth daily.       Cholecalciferol (VITAMIN D3) 1000 UNITS CAPS Take 1,000 Units by mouth daily.      ferrous fumarate (FERRETTS) 325 (106 FE) MG TABS Take 1 tablet by mouth daily.      fluconazole (DIFLUCAN) 100 MG tablet TAKE 1 TABLET BY MOUTH  DAILY AS NEEDED FOR YEAST  INFECTION 90 tablet 1   fluticasone (FLONASE) 50 MCG/ACT nasal spray Place 2 sprays into both nostrils daily as needed for allergies or rhinitis.     folic acid (FOLVITE) 1 MG tablet Take 1 mg by mouth daily.       furosemide (LASIX) 40 MG tablet Take 1 tablet (40 mg total) by mouth daily as needed. 90 tablet 3   levofloxacin (LEVAQUIN) 500 MG tablet Take 1 tablet (500 mg total) by mouth daily. 7 tablet 0   levothyroxine (SYNTHROID) 50 MCG tablet Take 1 tablet (50 mcg total) by mouth daily. 90 tablet 3   loratadine (CLARITIN) 10 MG tablet Take 10 mg by mouth daily.     Multiple Vitamin (MULTIVITAMIN WITH MINERALS) TABS tablet Take 1 tablet by mouth daily.     potassium chloride SA (KLOR-CON) 20 MEQ tablet Take 1 tablet (20 mEq total) by mouth 2 (two) times daily. 180 tablet 3   promethazine (PHENERGAN) 25 MG tablet Take 1 tablet (25 mg total) by mouth 2 (two) times daily. 180 tablet 1   ranitidine (ZANTAC) 150 MG tablet Take 150 mg by mouth daily.      rOPINIRole (REQUIP) 1 MG tablet Take 1 tablet (1 mg total) by mouth at bedtime. 90  tablet 3   simvastatin (ZOCOR) 20 MG tablet Take 1 tablet (20 mg total) by mouth daily. 90 tablet 3   tiZANidine (ZANAFLEX) 2 MG tablet Take 2 mg by mouth daily.      traMADol (ULTRAM) 50 MG tablet Take 1 tablet (50 mg total) by mouth every 6 (six) hours as needed. for pain 120 tablet 1   No current facility-administered medications for this visit.    Allergies as of 07/10/2020 - Review Complete 07/10/2020  Allergen Reaction Noted   Zofran [ondansetron hcl] Anaphylaxis and Other (See Comments) 07/23/2012   Clarithromycin     Codeine     Diazepam Other (See Comments) 10/11/2012   Sulfonamide derivatives     Prilosec [omeprazole magnesium]  Rash 05/26/2011   Trazodone Anxiety 10/11/2012    Family History  Problem Relation Age of Onset   Parkinsonism Father    Hypertension Other    Heart disease Other        CAD.Marland Kitchen1ST DEGREE RELATIVE <60   Hyperlipidemia Other    Breast cancer Sister    Breast cancer Other    Colon cancer Neg Hx    Esophageal cancer Neg Hx    Rectal cancer Neg Hx    Stomach cancer Neg Hx     Social History   Socioeconomic History   Marital status: Widowed    Spouse name: Not on file   Number of children: 2   Years of education: Not on file   Highest education level: Not on file  Occupational History   Occupation: Retired    Fish farm manager: RETIRED  Tobacco Use   Smoking status: Former Smoker   Smokeless tobacco: Never Used  Substance and Sexual Activity   Alcohol use: No   Drug use: No   Sexual activity: Not Currently  Other Topics Concern   Not on file  Social History Narrative   REG EXERCISE   WIDOW   LIVES ALONE   END OF [place] OTHER HEROIC OR FUTILE MEASURES.   Social Determinants of Health   Financial Resource Strain:    Difficulty of Paying Living Expenses: Not on file  Food Insecurity:    Worried About  Charity fundraiser in the Last Year: Not on file   [employer] of Food in the Last Year: Not on file  Transportation Needs:    Lack of Transportation (Medical): Not on file   Lack of Transportation (Non-Medical): Not on file  Physical Activity:    Days of Exercise per Week: Not on file   Minutes of Exercise per Session: Not on file  Stress:    Feeling of Stress : Not on file  Social Connections:    Frequency of Communication with Friends and Family: Not on file   Frequency of Social Gatherings with Friends and Family: Not on file   Attends Religious Services: Not on file   Active Member of Clubs or Organizations: Not on file   Attends Archivist Meetings: Not on file   Marital Status: Not on file  Intimate Partner Violence:    Fear of Current or Ex-Partner: Not on file   Emotionally Abused: Not on file   Physically Abused: Not on file   Sexually Abused: Not on file    Physical Exam: Ht 5' 2.5" (1.588 m)    Wt 87 lb (39.5 kg)    BMI 15.66 kg/m  Constitutional: Elderly, frail Psychiatric: alert and oriented x3 Eyes: extraocular movements intact Mouth: oral pharynx moist, no lesions Neck: supple no lymphadenopathy Cardiovascular: heart regular rate and rhythm Lungs: clear to auscultation bilaterally Abdomen: soft, nontender, nondistended, no obvious ascites, no peritoneal signs, normal bowel sounds Extremities: no lower extremity edema bilaterally Skin: no lesions on visible extremities   Assessment and plan: 86 y.o. female with Hemoccult positive stool, slight anemia  She has been on iron supplements for years and so it is difficult to say if she has iron deficiency based on her lab tests.  She understands that she has microscopic blood in her stool but is otherwise asymptomatic.  She and her daughter understand that this may be due to a cancer in her colon.  I  think that that is unlikely however.  She is elderly and quite frail and would be at increased  risk for any kind of invasive testing such as a colonoscopy.  We had a very nice discussion and in the end we decided to observe her blood count trend over the next several months.  She is due to have labs in March, ordered by her primary care physician.  We will reach out for those results and if they are around her current levels then we could continue to just follow them over time.  If however her blood counts drop or she has overt GI symptoms then there may be more benefit to a invasive test like a colonoscopy.    Please see the "Patient Instructions" section for addition details about the plan.   Owens Loffler, MD [facility] 07/10/2020, 8:53 AM  Cc: Carollee Herter, Alferd Apa, *  Total time on date of encounter was 45  minutes (this included time spent preparing to see the patient reviewing records; obtaining and/or reviewing separately obtained history; performing a medically appropriate exam and/or evaluation; counseling and educating the patient and family if present; ordering medications, tests or procedures if applicable; and documenting clinical information in the health record).

## 2020-08-23 ENCOUNTER — Telehealth: Payer: Self-pay | Admitting: *Deleted

## 2020-08-23 ENCOUNTER — Other Ambulatory Visit: Payer: Self-pay | Admitting: Family Medicine

## 2020-08-23 DIAGNOSIS — F419 Anxiety disorder, unspecified: Secondary | ICD-10-CM

## 2020-08-23 MED ORDER — ALPRAZOLAM 0.5 MG PO TABS: 0.5000 mg | ORAL_TABLET | Freq: Two times a day (BID) | ORAL | 0 refills | Status: DC | PRN

## 2020-08-23 NOTE — Telephone Encounter (Signed)
done

## 2020-08-23 NOTE — Telephone Encounter (Signed)
Requesting: alprazolam to  Contract: 11/05/17 UDS: 11/05/17 Last Visit: 06/21/20 Next Visit: 12/21/20 Last Refill: 05/22/20  Please Advise

## 2020-08-24 ENCOUNTER — Encounter: Payer: Self-pay | Admitting: Family Medicine

## 2020-08-24 DIAGNOSIS — F419 Anxiety disorder, unspecified: Secondary | ICD-10-CM

## 2020-08-27 MED ORDER — ALPRAZOLAM 0.5 MG PO TABS: 0.5000 mg | ORAL_TABLET | Freq: Two times a day (BID) | ORAL | 0 refills | Status: DC | PRN

## 2020-08-27 NOTE — Telephone Encounter (Signed)
Requesting: Xanax Contract: 11/05/2017 UDS: 11/05/2017 Last OV: 06/21/2020 Next OV: 12/21/2020 Last Refill: 08/23/2020, #[id number] RF Database:   Please advise

## 2020-09-05 ENCOUNTER — Other Ambulatory Visit: Payer: Self-pay

## 2020-09-05 ENCOUNTER — Other Ambulatory Visit: Payer: Medicare Other

## 2020-09-05 DIAGNOSIS — Z20822 Contact with and (suspected) exposure to covid-19: Secondary | ICD-10-CM | POA: Diagnosis not present

## 2020-09-08 LAB — NOVEL CORONAVIRUS, NAA: SARS-CoV-2, NAA: NOT DETECTED

## 2020-09-09 ENCOUNTER — Telehealth: Payer: Self-pay

## 2020-09-09 NOTE — Telephone Encounter (Signed)
Patient is negative for covid and positive protocol was sent in error. Message sent to patient to disregard as it was sent in error.

## 2020-09-27 ENCOUNTER — Other Ambulatory Visit (INDEPENDENT_AMBULATORY_CARE_PROVIDER_SITE_OTHER): Payer: Medicare Other

## 2020-09-27 DIAGNOSIS — R7989 Other specified abnormal findings of blood chemistry: Secondary | ICD-10-CM | POA: Diagnosis not present

## 2020-09-27 DIAGNOSIS — D509 Iron deficiency anemia, unspecified: Secondary | ICD-10-CM | POA: Diagnosis not present

## 2020-09-27 LAB — CBC WITH DIFFERENTIAL/PLATELET
Basophils Absolute: 0 10*3/uL (ref 0.0–0.1)
Basophils Relative: 0.5 % (ref 0.0–3.0)
Eosinophils Absolute: 0 10*3/uL (ref 0.0–0.7)
Eosinophils Relative: 1.1 % (ref 0.0–5.0)
HCT: 35.9 % — ABNORMAL LOW (ref 36.0–46.0)
Hemoglobin: 12.3 g/dL (ref 12.0–15.0)
Lymphocytes Relative: 34.3 % (ref 12.0–46.0)
Lymphs Abs: 1.4 10*3/uL (ref 0.7–4.0)
MCHC: 34.2 g/dL (ref 30.0–36.0)
MCV: 92.4 fl (ref 78.0–100.0)
Monocytes Absolute: 0.4 10*3/uL (ref 0.1–1.0)
Monocytes Relative: 8.9 % (ref 3.0–12.0)
Neutro Abs: 2.3 10*3/uL (ref 1.4–7.7)
Neutrophils Relative %: 55.2 % (ref 43.0–77.0)
Platelets: 247 10*3/uL (ref 150.0–400.0)
RBC: 3.89 Mil/uL (ref 3.87–5.11)
RDW: 13.1 % (ref 11.5–15.5)
WBC: 4.2 10*3/uL (ref 4.0–10.5)

## 2020-09-27 LAB — IBC + FERRITIN
Ferritin: 289.4 ng/mL (ref 10.0–291.0)
Iron: 90 ug/dL (ref 42–145)
Saturation Ratios: 30.8 % (ref 20.0–50.0)
Transferrin: 209 mg/dL — ABNORMAL LOW (ref 212.0–360.0)

## 2020-09-27 LAB — TSH: TSH: 2.65 u[IU]/mL (ref 0.35–4.50)

## 2020-10-19 ENCOUNTER — Encounter: Payer: Medicare Other | Admitting: Family Medicine

## 2020-10-25 DIAGNOSIS — H40013 Open angle with borderline findings, low risk, bilateral: Secondary | ICD-10-CM | POA: Diagnosis not present

## 2020-10-25 DIAGNOSIS — H04123 Dry eye syndrome of bilateral lacrimal glands: Secondary | ICD-10-CM | POA: Diagnosis not present

## 2020-10-25 DIAGNOSIS — H353132 Nonexudative age-related macular degeneration, bilateral, intermediate dry stage: Secondary | ICD-10-CM | POA: Diagnosis not present

## 2020-10-25 DIAGNOSIS — C441122 Basal cell carcinoma of skin of right lower eyelid, including canthus: Secondary | ICD-10-CM | POA: Diagnosis not present

## 2020-10-26 ENCOUNTER — Telehealth: Payer: Self-pay

## 2020-10-26 NOTE — Telephone Encounter (Signed)
-----   Message from Milus Banister, MD sent at 10/26/2020  5:50 AM EST ----- Regarding: RE: labs Great, thanks  Can you let the patient know that I saw these labs.  They look great.  She should continue iron supplement. Lets get cbc in 6 months.  Thanks  ----- Message ----- From: Stevan Born, CMA Sent: 10/25/2020   3:35 PM EST To: Milus Banister, MD Subject: FW: labs                                       When you saw her in Nov you had asked that when she had CBC drawn to make you aware so that you could review.  Looks like she had labs in Feb.  Just wanted to make you aware,    Elmyra Ricks   ----- Message ----- From: Stevan Born, [place]: 10/23/2020  12:00 AM EST To: Stevan Born, CMA Subject: labs                                           Patient is to have CBC and other lab work drawn in March 2022 by PCP.  Dr Ardis Hughs would like to review.

## 2020-10-26 NOTE — Telephone Encounter (Signed)
Left message on voicemail that Dr Ardis Hughs has reviewed lab work.  Advised labs work looks great and that patient should continue iron supplement.  Sent staff message to myself to have CBC drawn in 6 months (September 2022).

## 2020-10-29 DIAGNOSIS — R69 Illness, unspecified: Secondary | ICD-10-CM | POA: Diagnosis not present

## 2020-11-04 ENCOUNTER — Encounter (HOSPITAL_COMMUNITY): Payer: Self-pay | Admitting: Emergency Medicine

## 2020-11-04 ENCOUNTER — Other Ambulatory Visit: Payer: Self-pay

## 2020-11-04 ENCOUNTER — Emergency Department (HOSPITAL_COMMUNITY): Payer: Medicare Other

## 2020-11-04 ENCOUNTER — Emergency Department (HOSPITAL_COMMUNITY)
Admission: EM | Admit: 2020-11-04 | Discharge: 2020-11-05 | Disposition: A | Discharge status: Home / Self Care | Payer: Medicare Other | Attending: Emergency Medicine | Admitting: Emergency Medicine

## 2020-11-04 DIAGNOSIS — R0602 Shortness of breath: Secondary | ICD-10-CM | POA: Diagnosis not present

## 2020-11-04 DIAGNOSIS — Z85528 Personal history of other malignant neoplasm of kidney: Secondary | ICD-10-CM | POA: Insufficient documentation

## 2020-11-04 DIAGNOSIS — Z79899 Other long term (current) drug therapy: Secondary | ICD-10-CM | POA: Diagnosis not present

## 2020-11-04 DIAGNOSIS — Z7982 Long term (current) use of aspirin: Secondary | ICD-10-CM | POA: Diagnosis not present

## 2020-11-04 DIAGNOSIS — R110 Nausea: Secondary | ICD-10-CM | POA: Insufficient documentation

## 2020-11-04 DIAGNOSIS — Z87891 Personal history of nicotine dependence: Secondary | ICD-10-CM | POA: Diagnosis not present

## 2020-11-04 DIAGNOSIS — R531 Weakness: Secondary | ICD-10-CM | POA: Diagnosis not present

## 2020-11-04 DIAGNOSIS — E039 Hypothyroidism, unspecified: Secondary | ICD-10-CM | POA: Diagnosis not present

## 2020-11-04 DIAGNOSIS — K8689 Other specified diseases of pancreas: Secondary | ICD-10-CM

## 2020-11-04 DIAGNOSIS — N200 Calculus of kidney: Secondary | ICD-10-CM | POA: Diagnosis not present

## 2020-11-04 DIAGNOSIS — R42 Dizziness and giddiness: Secondary | ICD-10-CM | POA: Insufficient documentation

## 2020-11-04 DIAGNOSIS — K8020 Calculus of gallbladder without cholecystitis without obstruction: Secondary | ICD-10-CM | POA: Diagnosis not present

## 2020-11-04 LAB — CBC
HCT: 34 % — ABNORMAL LOW (ref 36.0–46.0)
Hemoglobin: 11.1 g/dL — ABNORMAL LOW (ref 12.0–15.0)
MCH: 30.7 pg (ref 26.0–34.0)
MCHC: 32.6 g/dL (ref 30.0–36.0)
MCV: 94.2 fL (ref 80.0–100.0)
Platelets: 231 10*3/uL (ref 150–400)
RBC: 3.61 MIL/uL — ABNORMAL LOW (ref 3.87–5.11)
RDW: 12 % (ref 11.5–15.5)
WBC: 6.4 10*3/uL (ref 4.0–10.5)
nRBC: 0 % (ref 0.0–0.2)

## 2020-11-04 LAB — COMPREHENSIVE METABOLIC PANEL
ALT: 23 U/L (ref 0–44)
AST: 30 U/L (ref 15–41)
Albumin: 3.3 g/dL — ABNORMAL LOW (ref 3.5–5.0)
Alkaline Phosphatase: 49 U/L (ref 38–126)
Anion gap: 9 (ref 5–15)
BUN: 9 mg/dL (ref 8–23)
CO2: 23 mmol/L (ref 22–32)
Calcium: 8.9 mg/dL (ref 8.9–10.3)
Chloride: 98 mmol/L (ref 98–111)
Creatinine, Ser: 0.93 mg/dL (ref 0.44–1.00)
GFR, Estimated: 58 mL/min — ABNORMAL LOW (ref >60)
Glucose, Bld: 107 mg/dL — ABNORMAL HIGH (ref 70–99)
Potassium: 3.9 mmol/L (ref 3.5–5.1)
Sodium: 130 mmol/L — ABNORMAL LOW (ref 135–145)
Total Bilirubin: 0.7 mg/dL (ref 0.3–1.2)
Total Protein: 5.7 g/dL — ABNORMAL LOW (ref 6.5–8.1)

## 2020-11-04 LAB — TROPONIN I (HIGH SENSITIVITY): Troponin I (High Sensitivity): 9 ng/L (ref <18)

## 2020-11-04 LAB — LIPASE, BLOOD: Lipase: 149 U/L — ABNORMAL HIGH (ref 11–51)

## 2020-11-04 MED ORDER — SODIUM CHLORIDE 0.9 % IV BOLUS
500.0000 mL | Freq: Once | INTRAVENOUS | Status: AC
  Administered 2020-11-04: 500 mL via INTRAVENOUS

## 2020-11-04 MED ORDER — IOHEXOL 300 MG/ML  SOLN
75.0000 mL | Freq: Once | INTRAMUSCULAR | Status: AC | PRN
  Administered 2020-11-04: 75 mL via INTRAVENOUS

## 2020-11-04 NOTE — ED Triage Notes (Addendum)
Patient BIB daughter, reports weakness with nausea worsening x1 month, worse today. Hx MI. Denies chest pain.

## 2020-11-04 NOTE — ED Provider Notes (Signed)
[facility] DEPT Provider Note   CSN: [id number] Arrival date & time: 11/04/20  3009     History Chief Complaint  Patient presents with  . Weakness    [name] is a 87 y.o. female.  HPI 87 year old female with a history of depression, hypothyroidism osteoporosis, MI presents to the ER with complaints of ongoing nausea and weakness over the last 3 months, however worsening today.  Patient reports worsening nausea and dizziness, which she felt during her last MI.  Daughter at bedside states that she gets easily dehydrated, has been drinking a lot of Pedialyte.  She denies any abdominal pain, chest pain, does endorse some shortness of breath but this has been chronic.  She denies any dysuria or hematuria, no diarrhea.  No cough, nasal congestion, fevers.  Past Medical History:  Diagnosis Date  . Allergy   . Anemia   . Anxiety   . Cataract 05/2011  . Chronic low blood pressure    due to dehydration  . Depression   . Esophageal disorder   . Esophageal yeast infection ([place])   . GERD (gastroesophageal reflux disease)   . Hearing loss    hearing aids  . Hyperlipidemia   . Hypothyroidism   . Myocardial infarction ([place])   . Osteoporosis   . Renal cell carcinoma ([place])    found on CT  . Thyroid disease    hypothyroidism  . Transfusion history    last 2004    Patient Active Problem List   Diagnosis Date Noted  . Preventative health care 11/08/2018  . Open wound of left lower leg 07/15/2018  . Fall 07/10/2018  . Wound of left leg 07/10/2018  . Wound of right leg, initial encounter 07/10/2018  . Pancreatic mass   . Osteoarthritis of multiple joints 11/05/2017  . Atherosclerosis of native coronary artery of native heart without angina pectoris 11/05/2017  . Right renal mass   . Suspicious mole 06/06/2013  . Allergic drug reaction 07/23/2012  . RLS (restless legs syndrome) 05/31/2012  . Insomnia 05/26/2011  . Osteoporosis 05/26/2011  .  Hypothyroidism 05/10/2008  . HYPERCALCEMIA 03/16/2008  . UTI 03/06/2008  . WEAKNESS 03/06/2008  . Hyperlipidemia 04/12/2007  . ARTIFICIAL MENOPAUSE 04/12/2007  . ANXIETY 01/12/2007  . DEPRESSION 01/12/2007  . MYOCARDIAL INFARCTION, HX OF 01/12/2007    Past Surgical History:  Procedure Laterality Date  . APPENDECTOMY    . BLEPHAROPLASTY Right    x 3 (all related to basal cell carcinoma)  . COLONOSCOPY    . CORONARY ARTERY BYPASS GRAFT     x2 vessel bypass -Dr. Burt Knack -[facility] 06-22-14  . EUS N/A 02/04/2018   Procedure: UPPER ENDOSCOPIC ULTRASOUND (EUS) RADIAL;  Surgeon: Milus Banister, MD;  Location: [facility] ENDOSCOPY;  Service: Endoscopy;  Laterality: N/A;  . FINE NEEDLE ASPIRATION N/A 02/04/2018   Procedure: FINE NEEDLE ASPIRATION (FNA) LINEAR;  Surgeon: Milus Banister, MD;  Location: [facility] ENDOSCOPY;  Service: Endoscopy;  Laterality: N/A;  . IR GENERIC HISTORICAL  08/29/2014   IR RADIOLOGIST EVAL & MGMT 08/29/2014 Aletta Edouard, MD [facility] INTERV RAD  . IR GENERIC HISTORICAL  10/22/2016   IR RADIOLOGIST EVAL & MGMT 10/22/2016 Aletta Edouard, MD [facility] INTERV RAD  . IR RADIOLOGIST EVAL & MGMT  11/03/2017  . IR RADIOLOGIST EVAL & MGMT  02/15/2019  . KYPHOSIS SURGERY    . obstructed ureter Left    repair-operative  . RENAL CRYOABLATION Right 09/2014  . TONSILLECTOMY    .  TOTAL ABDOMINAL HYSTERECTOMY W/ BILATERAL SALPINGOOPHORECTOMY       OB History   No obstetric history on file.     Family History  Problem Relation Age of Onset  . Parkinsonism Father   . Hypertension Other   . Heart disease Other        CAD.Marland Kitchen1ST DEGREE RELATIVE <60  . Hyperlipidemia Other   . Breast cancer Sister   . Breast cancer Other   . Colon cancer Neg Hx   . Esophageal cancer Neg Hx   . Rectal cancer Neg Hx   . Stomach cancer Neg Hx     Social History   Tobacco Use  . Smoking status: Former Research scientist (life sciences)  . Smokeless tobacco: Never Used  Substance Use Topics  . Alcohol use: No  . Drug use: No     Home Medications Prior to Admission medications   Medication Sig Start Date End Date Taking? Authorizing Provider  ALPRAZolam Duanne Moron) 0.5 MG tablet Take 1 tablet (0.5 mg total) by mouth 2 (two) times daily as needed for anxiety. 08/27/20  Yes Ann Held, DO  aspirin 81 MG EC tablet Take 81 mg by mouth daily.   Yes [provider]  Cholecalciferol (VITAMIN D3) 1000 UNITS CAPS Take 1,000 Units by mouth daily.   Yes [provider]  ferrous fumarate (HEMOCYTE - 106 MG FE) 325 (106 Fe) MG TABS tablet Take 325 mg of iron by mouth daily.   Yes [provider]  fluticasone (FLONASE) 50 MCG/ACT nasal spray Place 2 sprays into both nostrils daily as needed for allergies or rhinitis.   Yes [provider]  folic acid (FOLVITE) 1 MG tablet Take 1 mg by mouth daily.   Yes [provider]  furosemide (LASIX) 40 MG tablet Take 1 tablet (40 mg total) by mouth daily as needed. Patient taking differently: Take 40 mg by mouth daily as needed for fluid. 05/17/20  Yes Roma Schanz R, DO  hydroxypropyl methylcellulose / hypromellose (ISOPTO TEARS / GONIOVISC) 2.5 % ophthalmic solution Place 1 drop into both eyes daily as needed for dry eyes.   Yes [provider]  ibuprofen (ADVIL) 200 MG tablet Take 400 mg by mouth every 6 (six) hours as needed for mild pain.   Yes [provider]  levothyroxine (SYNTHROID) 50 MCG tablet Take 1 tablet (50 mcg total) by mouth daily. 05/17/20  Yes Ann Held, DO  Multiple Vitamin (MULTIVITAMIN WITH MINERALS) TABS tablet Take 1 tablet by mouth daily.   Yes [provider]  oxymetazoline (AFRIN) 0.05 % nasal spray Place 1 spray into both nostrils daily as needed for congestion.   Yes [provider]  potassium chloride SA (KLOR-CON) 20 MEQ tablet Take 1 tablet (20 mEq total) by mouth 2 (two) times daily. 05/17/20  Yes Ann Held, DO  promethazine (PHENERGAN) 25 MG  tablet Take 1 tablet (25 mg total) by mouth 2 (two) times daily. Patient taking differently: Take 25 mg by mouth 2 (two) times daily as needed for nausea. 05/17/20  Yes Roma Schanz R, DO  rOPINIRole (REQUIP) 1 MG tablet Take 1 tablet (1 mg total) by mouth at bedtime. 05/17/20  Yes Roma Schanz R, DO  simvastatin (ZOCOR) 20 MG tablet Take 1 tablet (20 mg total) by mouth daily. 05/17/20  Yes Roma Schanz R, DO  tiZANidine (ZANAFLEX) 2 MG tablet Take 2 mg by mouth daily as needed for muscle spasms.   Yes  [provider]  traMADol (ULTRAM) 50 MG tablet Take 1 tablet (50 mg total) by mouth every 6 (six) hours as needed. for pain Patient taking differently: Take 50 mg by mouth every 6 (six) hours as needed for moderate pain. 05/18/20  Yes Roma Schanz R, DO  fluconazole (DIFLUCAN) 100 MG tablet TAKE 1 TABLET BY MOUTH  DAILY AS NEEDED FOR YEAST  INFECTION Patient not taking: No sig reported 05/22/20   Carollee Herter, Alferd Apa, DO  levofloxacin (LEVAQUIN) 500 MG tablet Take 1 tablet (500 mg total) by mouth daily. Patient not taking: No sig reported 07/26/18   Carollee Herter, Alferd Apa, DO    Allergies    Zofran Alvis Lemmings hcl], Clarithromycin, Codeine, Diazepam, Sulfonamide derivatives, Prilosec [omeprazole magnesium], and Trazodone  Review of Systems   Review of Systems  Constitutional: Negative for chills and fever.  HENT: Negative for ear pain and sore throat.   Eyes: Negative for pain and visual disturbance.  Respiratory: Positive for shortness of breath. Negative for cough.   Cardiovascular: Negative for chest pain and palpitations.  Gastrointestinal: Negative for abdominal pain and vomiting.  Genitourinary: Negative for dysuria and hematuria.  Musculoskeletal: Negative for arthralgias and back pain.  Skin: Negative for color change and rash.  Neurological: Positive for dizziness and weakness. Negative for seizures and syncope.  All other systems reviewed and are  negative.   Physical Exam Updated Vital Signs BP (!) 159/71   Pulse 82   Temp 98.4 F (36.9 C) (Oral)   Resp 18   SpO2 99%   Physical Exam Vitals and nursing note reviewed.  Constitutional:      General: She is not in acute distress.    Appearance: She is well-developed. She is not ill-appearing or diaphoretic.  HENT:     Head: Normocephalic and atraumatic.  Eyes:     Conjunctiva/sclera: Conjunctivae normal.  Cardiovascular:     Rate and Rhythm: Normal rate and regular rhythm.     Heart sounds: No murmur heard.   Pulmonary:     Effort: Pulmonary effort is normal. No respiratory distress.     Breath sounds: Normal breath sounds.  Abdominal:     General: Abdomen is flat.     Palpations: Abdomen is soft.     Tenderness: There is no abdominal tenderness.  Musculoskeletal:        General: Normal range of motion.     Cervical back: Neck supple.  Skin:    General: Skin is warm and dry.     Capillary Refill: Capillary refill takes less than 2 seconds.  Neurological:     General: No focal deficit present.     Mental Status: She is alert and oriented to person, place, and time.  Psychiatric:        Mood and Affect: Mood normal.        Behavior: Behavior normal.     ED Results / Procedures / Treatments   Labs (all labs ordered are listed, but only abnormal results are displayed) Labs Reviewed  CBC - Abnormal; Notable for the following components:      Result Value   RBC 3.61 (*)    Hemoglobin 11.1 (*)    HCT 34.0 (*)    All other components within normal limits  COMPREHENSIVE METABOLIC PANEL - Abnormal; Notable for the following components:   Sodium 130 (*)    Glucose, Bld 107 (*)    Total Protein 5.7 (*)    Albumin 3.3 (*)  GFR, Estimated 58 (*)    All other components within normal limits  URINALYSIS, ROUTINE W REFLEX MICROSCOPIC  TSH  T4, FREE  LIPASE, BLOOD  TROPONIN I (HIGH SENSITIVITY)  TROPONIN I (HIGH SENSITIVITY)    EKG None  Radiology DG  Chest 2 View  Result Date: 11/04/2020 CLINICAL DATA:  Nausea, dizziness, worsening for 1 month EXAM: CHEST - 2 VIEW COMPARISON:  07/07/2018 FINDINGS: Frontal and lateral views of the chest demonstrate an unremarkable cardiac silhouette. No acute airspace disease, effusion, or pneumothorax. Chronic compression deformity at the thoracolumbar junction with evidence of prior vertebral augmentation. No acute bony abnormalities. IMPRESSION: 1. Stable chest, no acute process. Electronically Signed   By: Randa Ngo M.D.   On: 11/04/2020 19:53    Procedures Procedures   Medications Ordered in ED Medications  iohexol (OMNIPAQUE) 300 MG/ML solution 75 mL (has no administration in time range)  sodium chloride 0.9 % bolus 500 mL (500 mLs Intravenous New Bag/Given 11/04/20 2100)    ED Course  I have reviewed the triage vital signs and the nursing notes.  Pertinent labs & imaging results that were available during my care of the patient were reviewed by me and considered in my medical decision making (see chart for details).    MDM Rules/Calculators/A&P                          87 year old female presents to the ER with waxing and waning nausea and weakness over the last several months, worsening today.  On arrival, she is frail and cachectic appearing, however well-appearing, alert and oriented x3.  Vitals overall reassuring, though she was initially hypertensive on arrival with a blood pressure of 184/94, however this steadily improved throughout the ED course, now currently 149/70.  No evidence of hypoxia, tachycardia, tachypnea.  Physical exam overall reassuring, abdomen is soft and nontender, lung sounds clear.  DDx for weakness and nausea is broad, includes UTI, pneumonia, diverticulitis, neoplasm, small bowel obstruction  CT scan of the abdomen reviewed from 2020, showed a questionable mass on her pancreas concerning for neoplasm.  Personally reviewed and interpreted lab work and imaging CBC  without cytosis, stable hemoglobin of 11.1.  CMP with mild hyponatremia 130, no evidence of AKI, normal LFTs, normal anion gap.  Chest x-ray without evidence of pneumonia.  EKG normal sinus rhythm.  Pending troponin, TSH, lipase  CT of the abdomen ordered to rule out any intra-abdominal process as a cause of her nausea, valuate for possible worsening neoplasm.  Care of the patient signed out to Dr. Joya Gaskins who will oversee her scan and dispo accordingly   Final Clinical Impression(s) / ED Diagnoses Final diagnoses:  None    Rx / DC Orders ED Discharge Orders    None       Lyndel Safe 11/04/20 2139    Arnaldo Natal, MD 11/05/20 1740

## 2020-11-04 NOTE — ED Provider Notes (Signed)
  Physical Exam  BP (!) 142/66   Pulse 80   Temp 98.4 F (36.9 C) (Oral)   Resp 17   SpO2 97%   Physical Exam  ED Course/Procedures     Procedures  MDM  I assumed care for this patient, also supervised.  She presented with nausea and weakness worsening over the past 3 months but especially today.  She has a known pancreatic mass that appeared to be benign cyst when it was evaluated in 2020.  The patient was noted to have a slight elevation in her lipase, normal liver enzymes, and an increase in size of her pancreatic mass.  She was feeling better after IV hydration.  She agrees to follow-up with GI and her PCP.  Messages were sent to both to inform them of the ED course.  She has Phenergan at home and is comfortable with that as far as antiemetics.       Arnaldo Natal, MD 11/04/20 [phone]

## 2020-11-05 ENCOUNTER — Encounter: Payer: Self-pay | Admitting: Family Medicine

## 2020-11-06 ENCOUNTER — Other Ambulatory Visit: Payer: Self-pay | Admitting: Family Medicine

## 2020-11-06 DIAGNOSIS — R1900 Intra-abdominal and pelvic swelling, mass and lump, unspecified site: Secondary | ICD-10-CM

## 2020-11-06 DIAGNOSIS — F419 Anxiety disorder, unspecified: Secondary | ICD-10-CM

## 2020-11-06 DIAGNOSIS — M159 Polyosteoarthritis, unspecified: Secondary | ICD-10-CM

## 2020-11-06 DIAGNOSIS — R110 Nausea: Secondary | ICD-10-CM

## 2020-11-06 MED ORDER — TRAMADOL HCL 50 MG PO TABS: 50.0000 mg | ORAL_TABLET | Freq: Four times a day (QID) | ORAL | 1 refills | Status: DC | PRN

## 2020-11-06 MED ORDER — ALPRAZOLAM 0.5 MG PO TABS: 0.5000 mg | ORAL_TABLET | Freq: Two times a day (BID) | ORAL | 0 refills | Status: DC | PRN

## 2020-11-06 MED ORDER — PROMETHAZINE HCL 25 MG PO TABS: 25.0000 mg | ORAL_TABLET | Freq: Two times a day (BID) | ORAL | 1 refills | Status: DC

## 2020-11-06 NOTE — Telephone Encounter (Signed)
I did order mri

## 2020-11-06 NOTE — Telephone Encounter (Signed)
Er note actually says to f/u with [place] in office -and to f/u with GI

## 2020-11-07 ENCOUNTER — Other Ambulatory Visit: Payer: Self-pay

## 2020-11-07 DIAGNOSIS — F419 Anxiety disorder, unspecified: Secondary | ICD-10-CM

## 2020-11-07 DIAGNOSIS — R110 Nausea: Secondary | ICD-10-CM

## 2020-11-07 DIAGNOSIS — M159 Polyosteoarthritis, unspecified: Secondary | ICD-10-CM

## 2020-11-07 MED ORDER — PROMETHAZINE HCL 25 MG PO TABS: 25.0000 mg | ORAL_TABLET | Freq: Two times a day (BID) | ORAL | 1 refills | Status: DC

## 2020-11-07 MED ORDER — TRAMADOL HCL 50 MG PO TABS
50.0000 mg | ORAL_TABLET | Freq: Four times a day (QID) | ORAL | 1 refills | Status: DC | PRN
Start: 2020-11-07 — End: 2021-02-26

## 2020-11-07 MED ORDER — ALPRAZOLAM 0.5 MG PO TABS: 0.5000 mg | ORAL_TABLET | Freq: Two times a day (BID) | ORAL | 0 refills | Status: DC | PRN

## 2020-11-14 ENCOUNTER — Telehealth: Payer: Self-pay

## 2020-11-14 NOTE — Telephone Encounter (Signed)
Spoke with Izora Gala from [facility] and she informed me that patients medication Tramadol has been sent out , Phenergan refilled today, and Alprazolam is not available for refill until 11/20/20 and reference # for the call is [id number]. Unable to contact patient left detailed voice mail for patient or family to return call to office with any further questions.

## 2020-11-14 NOTE — Telephone Encounter (Signed)
Spoke with Izora Gala from [facility] and she informed me that patients medication Tramadol has been sent out , Phenergan refilled today, and Alprazolam is not available for refill until 11/20/20 and reference # for the call is [id number]. Unable to contact patient left detailed voice mail for patient or family to return call to office with any further questions.

## 2020-11-16 ENCOUNTER — Other Ambulatory Visit: Payer: Self-pay | Admitting: Family Medicine

## 2020-11-16 ENCOUNTER — Ambulatory Visit (HOSPITAL_COMMUNITY)
Admission: RE | Admit: 2020-11-16 | Discharge: 2020-11-16 | Disposition: A | Discharge status: Home / Self Care | Payer: Medicare Other | Source: Ambulatory Visit | Attending: Family Medicine | Admitting: Family Medicine

## 2020-11-16 ENCOUNTER — Other Ambulatory Visit: Payer: Self-pay

## 2020-11-16 DIAGNOSIS — R1900 Intra-abdominal and pelvic swelling, mass and lump, unspecified site: Secondary | ICD-10-CM

## 2020-11-16 DIAGNOSIS — K862 Cyst of pancreas: Secondary | ICD-10-CM | POA: Diagnosis not present

## 2020-11-16 DIAGNOSIS — R935 Abnormal findings on diagnostic imaging of other abdominal regions, including retroperitoneum: Secondary | ICD-10-CM | POA: Diagnosis not present

## 2020-11-16 MED ORDER — GADOBUTROL 1 MMOL/ML IV SOLN
4.0000 mL | Freq: Once | INTRAVENOUS | Status: AC | PRN
  Administered 2020-11-16: 4 mL via INTRAVENOUS

## 2020-11-18 ENCOUNTER — Encounter: Payer: Self-pay | Admitting: Family Medicine

## 2020-11-19 ENCOUNTER — Other Ambulatory Visit: Payer: Self-pay | Admitting: Family Medicine

## 2020-11-19 DIAGNOSIS — R634 Abnormal weight loss: Secondary | ICD-10-CM

## 2020-11-19 DIAGNOSIS — R110 Nausea: Secondary | ICD-10-CM

## 2020-11-19 NOTE — Telephone Encounter (Signed)
I have sent a message to Dr Ardis Hughs but I would like to see her as well

## 2020-11-19 NOTE — Telephone Encounter (Signed)
[name] usually orders that--  she has recently seen them so she should not need a referral but she does need to f/u with them

## 2020-11-19 NOTE — Telephone Encounter (Signed)
She needs to come in to see [place] probably too

## 2020-11-19 NOTE — Telephone Encounter (Signed)
I am not too concerned about the cysts either but rather her weakness and weight loss  The the endoscopy is done by GI---- i'm happy to send him a message unless they want to see someone else  Just let me know what the daughter want s to do

## 2020-11-20 ENCOUNTER — Telehealth: Payer: Self-pay

## 2020-11-20 NOTE — Telephone Encounter (Signed)
Appt has been made for 4/11 with JLL.  The pt daughter has been advised.

## 2020-11-20 NOTE — Telephone Encounter (Signed)
Dtr [name] is calling re a note on her mom's pt portal to set up a f/u appt to discuss her recent ER visit and results. Mother is pt [name] ([birth year]).

## 2020-11-20 NOTE — Telephone Encounter (Signed)
Pt has been scheduled for appointment.

## 2020-11-20 NOTE — Telephone Encounter (Signed)
-----   Message from Milus Banister, MD sent at 11/20/2020  5:38 AM EDT ----- Thanks.  At her last OV with me we discussed her heme+ stool, h/o adenoma.  Elected not to proceed with colonoscopy given her age, frailty.  MAy need to readdress that issue.   Alexiah Koroma, She needs first available OV with myself or extender.  Thanks  DJ  ----- Message ----- From: Ann Held, DO Sent: 11/19/2020   5:23 PM EDT To: Milus Banister, MD  Ms howells daughter called the office . She is very concerned about her weight loss / weakness and nausea.  She was recently seen in the er ----  I am having her come in to see me but they advised she f/u with you as well .  The family is very concerned about her    she is becoming very weak fast per the family ---  I have placed another referral since it is for a different problem but they requested I reach out to you as well    thanks   Kendrick Fries

## 2020-11-22 ENCOUNTER — Other Ambulatory Visit: Payer: Self-pay

## 2020-11-23 ENCOUNTER — Encounter: Payer: Self-pay | Admitting: Family Medicine

## 2020-11-23 ENCOUNTER — Ambulatory Visit (INDEPENDENT_AMBULATORY_CARE_PROVIDER_SITE_OTHER): Payer: Medicare Other | Admitting: Family Medicine

## 2020-11-23 ENCOUNTER — Other Ambulatory Visit: Payer: Self-pay

## 2020-11-23 VITALS — BP 140/80 | HR 78 | Temp 98.6°F | Resp 20 | Ht 62.5 in | Wt 89.0 lb

## 2020-11-23 DIAGNOSIS — E785 Hyperlipidemia, unspecified: Secondary | ICD-10-CM | POA: Diagnosis not present

## 2020-11-23 DIAGNOSIS — E039 Hypothyroidism, unspecified: Secondary | ICD-10-CM

## 2020-11-23 DIAGNOSIS — E43 Unspecified severe protein-calorie malnutrition: Secondary | ICD-10-CM

## 2020-11-23 LAB — CBC WITH DIFFERENTIAL/PLATELET
Basophils Absolute: 0 10*3/uL (ref 0.0–0.1)
Basophils Relative: 0.5 % (ref 0.0–3.0)
Eosinophils Absolute: 0 10*3/uL (ref 0.0–0.7)
Eosinophils Relative: 0.5 % (ref 0.0–5.0)
HCT: 37.9 % (ref 36.0–46.0)
Hemoglobin: 12.6 g/dL (ref 12.0–15.0)
Lymphocytes Relative: 25.8 % (ref 12.0–46.0)
Lymphs Abs: 1.5 10*3/uL (ref 0.7–4.0)
MCHC: 33.3 g/dL (ref 30.0–36.0)
MCV: 92.8 fl (ref 78.0–100.0)
Monocytes Absolute: 0.5 10*3/uL (ref 0.1–1.0)
Monocytes Relative: 8 % (ref 3.0–12.0)
Neutro Abs: 3.8 10*3/uL (ref 1.4–7.7)
Neutrophils Relative %: 65.2 % (ref 43.0–77.0)
Platelets: 269 10*3/uL (ref 150.0–400.0)
RBC: 4.09 Mil/uL (ref 3.87–5.11)
RDW: 12.9 % (ref 11.5–15.5)
WBC: 5.8 10*3/uL (ref 4.0–10.5)

## 2020-11-23 LAB — COMPREHENSIVE METABOLIC PANEL
ALT: 21 U/L (ref 0–35)
AST: 28 U/L (ref 0–37)
Albumin: 4.2 g/dL (ref 3.5–5.2)
Alkaline Phosphatase: 66 U/L (ref 39–117)
BUN: 15 mg/dL (ref 6–23)
CO2: 27 mEq/L (ref 19–32)
Calcium: 9.8 mg/dL (ref 8.4–10.5)
Chloride: 98 mEq/L (ref 96–112)
Creatinine, Ser: 1.01 mg/dL (ref 0.40–1.20)
GFR: 48.98 mL/min — ABNORMAL LOW (ref >60.00)
Glucose, Bld: 111 mg/dL — ABNORMAL HIGH (ref 70–99)
Potassium: 4.2 mEq/L (ref 3.5–5.1)
Sodium: 135 mEq/L (ref 135–145)
Total Bilirubin: 0.5 mg/dL (ref 0.2–1.2)
Total Protein: 6.5 g/dL (ref 6.0–8.3)

## 2020-11-23 LAB — LIPID PANEL
Cholesterol: 160 mg/dL (ref 0–200)
HDL: 72 mg/dL (ref >39.00)
LDL Cholesterol: 77 mg/dL (ref 0–99)
NonHDL: 88.44
Total CHOL/HDL Ratio: 2
Triglycerides: 59 mg/dL (ref 0.0–149.0)
VLDL: 11.8 mg/dL (ref 0.0–40.0)

## 2020-11-23 LAB — TSH: TSH: 1.1 u[IU]/mL (ref 0.35–4.50)

## 2020-11-23 MED ORDER — MIRTAZAPINE 7.5 MG PO TABS: 7.5000 mg | ORAL_TABLET | Freq: Every day | ORAL | 1 refills | Status: DC

## 2020-11-23 NOTE — Patient Instructions (Signed)
Nausea, Adult Nausea is the feeling that you have an upset stomach or that you are about to vomit. Nausea on its own is not usually a serious concern, but it may be an early sign of a more serious medical problem. As nausea gets worse, it can lead to vomiting. If vomiting develops, or if you are not able to drink enough fluids, you are at risk of becoming dehydrated. Dehydration can make you tired and thirsty, cause you to have a dry mouth, and decrease how often you urinate. Older adults and people with other diseases or a weak disease-fighting system (immune system) are at higher risk for dehydration. The main goals of treating your nausea are:  To relieve your nausea.  To limit repeated nausea episodes.  To prevent vomiting and dehydration. Follow these instructions at home: Watch your symptoms for any changes. Tell your health care provider about them. Follow these instructions as told by your health care provider. Eating and drinking  Take an oral rehydration solution (ORS). This is a drink that is sold at pharmacies and retail stores.  Drink clear fluids slowly and in small amounts as you are able. Clear fluids include water, ice chips, low-calorie sports drinks, and fruit juice that has water added (diluted fruit juice).  Eat bland, easy-to-digest foods in small amounts as you are able. These foods include bananas, applesauce, rice, lean meats, toast, and crackers.  Avoid drinking fluids that contain a lot of sugar or caffeine, such as energy drinks, sports drinks, and soda.  Avoid alcohol.  Avoid spicy or fatty foods.      General instructions  Take over-the-counter and prescription medicines only as told by your health care provider.  Rest at home while you recover.  Drink enough fluid to keep your urine pale yellow.  Breathe slowly and deeply when you feel nauseous.  Avoid smelling things that have strong odors.  Wash your hands often using soap and water. If soap and  water are not available, use hand sanitizer.  Make sure that all people in your household wash their hands well and often.  Keep all follow-up visits as told by your health care provider. This is important. Contact a health care provider if:  Your nausea gets worse.  Your nausea does not go away after two days.  You vomit.  You cannot drink fluids without vomiting.  You have any of the following: ? New symptoms. ? A fever. ? A headache. ? Muscle cramps. ? A rash. ? Pain while urinating.  You feel light-headed or dizzy. Get help right away if:  You have pain in your chest, neck, arm, or jaw.  You feel extremely weak or you faint.  You have vomit that is bright red or looks like coffee grounds.  You have bloody or black stools or stools that look like tar.  You have a severe headache, a stiff neck, or both.  You have severe pain, cramping, or bloating in your abdomen.  You have difficulty breathing or are breathing very quickly.  Your heart is beating very quickly.  Your skin feels cold and clammy.  You feel confused.  You have signs of dehydration, such as: ? Dark urine, very little urine, or no urine. ? Cracked lips. ? Dry mouth. ? Sunken eyes. ? Sleepiness. ? Weakness. These symptoms may represent a serious problem that is an emergency. Do not wait to see if the symptoms will go away. Get medical help right away. Call your local emergency services (  911 in the [place] Do not drive yourself to the hospital. Summary  Nausea is the feeling that you have an upset stomach or that you are about to vomit. Nausea on its own is not usually a serious concern, but it may be an early sign of a more serious medical problem.  If vomiting develops, or if you are not able to drink enough fluids, you are at risk of becoming dehydrated.  Follow recommendations for eating and drinking and take over-the-counter and prescription medicines only as told by your health care  provider.  Contact a health care provider right away if your symptoms worsen or you have new symptoms.  Keep all follow-up visits as told by your health care provider. This is important. This information is not intended to replace advice given to you by your health care provider. Make sure you discuss any questions you have with your health care provider. Document Revised: 07/12/2019 Document Reviewed: 01/19/2018 Elsevier Patient Education  2021 [employer].

## 2020-11-23 NOTE — Progress Notes (Signed)
Patient ID: [name], female    DOB: [birth year]  Age: 87 y.o. MRN: [id number]    Subjective:  Subjective  HPI [name] presents for a hospital follow up today accompanied by her daughter. She complains of weakness. Her daughter reports, she easily get dehydrated and has been drinking Pedialyte. Her daughter endorses that she has been consuming small amounts of food. She was at the ED on 11/04/2020 for weakness and was discharged later that day. She also complains of trouble swallowing her pills. She states that her pills get stuck on her throat. She also complains of intermittent mid abdominal pain. She denies any chest pain, SOB, fever, abdominal pain, cough, chills, sore throat, dysuria, urinary incontinence, back pain, HA, or N/VD at this time.   Review of Systems  Constitutional: Negative for chills, fatigue and fever.  HENT: Positive for trouble swallowing (Pills). Negative for congestion, ear pain, sinus pressure, sinus pain and sore throat.   Eyes: Negative for pain.  Respiratory: Negative for cough and shortness of breath.   Cardiovascular: Negative for chest pain, palpitations and leg swelling.  Gastrointestinal: Positive for abdominal pain (Mid). Negative for blood in stool, constipation, diarrhea, nausea and vomiting.  Genitourinary: Negative for dysuria, frequency, hematuria and urgency.  Musculoskeletal: Negative for back pain.  Neurological: Positive for weakness. Negative for headaches.    History Past Medical History:  Diagnosis Date  . Allergy   . Anemia   . Anxiety   . Cataract 05/2011  . Chronic low blood pressure    due to dehydration  . Depression   . Esophageal disorder   . Esophageal yeast infection ([place])   . GERD (gastroesophageal reflux disease)   . Hearing loss    hearing aids  . Hyperlipidemia   . Hypothyroidism   . Myocardial infarction ([place])   . Osteoporosis   . Renal cell carcinoma ([place])    found on CT  . Thyroid disease     hypothyroidism  . Transfusion history    last 2004    She has a past surgical history that includes obstructed ureter (Left); Total abdominal hysterectomy w/ bilateral salpingoophorectomy; Kyphosis surgery; Tonsillectomy; Appendectomy; Colonoscopy; Coronary artery bypass graft; Renal cryoablation (Right, 09/2014); ir generic historical (08/29/2014); ir generic historical (10/22/2016); IR Radiologist Eval & Mgmt (11/03/2017); Blepharoplasty (Right); EUS (N/A, 02/04/2018); Fine needle aspiration (N/A, 02/04/2018); and IR Radiologist Eval & Mgmt (02/15/2019).   Her family history includes Breast cancer in her sister and another family member; Heart disease in an other family member; Hyperlipidemia in an other family member; Hypertension in an other family member; Parkinsonism in her father.She reports that she has quit smoking. She has never used smokeless tobacco. She reports that she does not drink alcohol and does not use drugs.  Current Outpatient Medications on File Prior to Visit  Medication Sig Dispense Refill  . ALPRAZolam (XANAX) 0.5 MG tablet Take 1 tablet (0.5 mg total) by mouth 2 (two) times daily as needed for anxiety. 180 tablet 0  . aspirin 81 MG EC tablet Take 81 mg by mouth daily.    . Cholecalciferol (VITAMIN D3) 1000 UNITS CAPS Take 1,000 Units by mouth daily.    . ferrous fumarate (HEMOCYTE - 106 MG FE) 325 (106 Fe) MG TABS tablet Take 325 mg of iron by mouth daily.    . fluticasone (FLONASE) 50 MCG/ACT nasal spray Place 2 sprays into both nostrils daily as needed for allergies or rhinitis.    . folic acid (FOLVITE) 1  MG tablet Take 1 mg by mouth daily.    . furosemide (LASIX) 40 MG tablet Take 1 tablet (40 mg total) by mouth daily as needed. (Patient taking differently: Take 40 mg by mouth daily as needed for fluid.) 90 tablet 3  . hydroxypropyl methylcellulose / hypromellose (ISOPTO TEARS / GONIOVISC) 2.5 % ophthalmic solution Place 1 drop into both eyes daily as needed for dry eyes.     Marland Kitchen ibuprofen (ADVIL) 200 MG tablet Take 400 mg by mouth every 6 (six) hours as needed for mild pain.    Marland Kitchen levofloxacin (LEVAQUIN) 500 MG tablet Take 1 tablet (500 mg total) by mouth daily. 7 tablet 0  . levothyroxine (SYNTHROID) 50 MCG tablet Take 1 tablet (50 mcg total) by mouth daily. 90 tablet 3  . Multiple Vitamin (MULTIVITAMIN WITH MINERALS) TABS tablet Take 1 tablet by mouth daily.    Marland Kitchen oxymetazoline (AFRIN) 0.05 % nasal spray Place 1 spray into both nostrils daily as needed for congestion.    . potassium chloride SA (KLOR-CON) 20 MEQ tablet Take 1 tablet (20 mEq total) by mouth 2 (two) times daily. 180 tablet 3  . promethazine (PHENERGAN) 25 MG tablet Take 1 tablet (25 mg total) by mouth 2 (two) times daily. 180 tablet 1  . rOPINIRole (REQUIP) 1 MG tablet Take 1 tablet (1 mg total) by mouth at bedtime. 90 tablet 3  . simvastatin (ZOCOR) 20 MG tablet Take 1 tablet (20 mg total) by mouth daily. 90 tablet 3  . tiZANidine (ZANAFLEX) 2 MG tablet Take 2 mg by mouth daily as needed for muscle spasms.    . traMADol (ULTRAM) 50 MG tablet Take 1 tablet (50 mg total) by mouth every 6 (six) hours as needed. for pain 120 tablet 1   No current facility-administered medications on file prior to visit.     Objective:  Objective  Physical Exam Vitals and nursing note reviewed.  Constitutional:      General: She is not in acute distress.    Appearance: She is cachectic. She is ill-appearing.  HENT:     Head: Normocephalic and atraumatic.     Right Ear: External ear normal.     Left Ear: External ear normal.     Nose: Nose normal.  Eyes:     Extraocular Movements: Extraocular movements intact.     Pupils: Pupils are equal, round, and reactive to light.  Cardiovascular:     Rate and Rhythm: Normal rate and regular rhythm.     Pulses: Normal pulses.     Heart sounds: Normal heart sounds. No murmur heard. No friction rub. No gallop.   Pulmonary:     Effort: Pulmonary effort is normal.      Breath sounds: Normal breath sounds.  Abdominal:     General: Bowel sounds are normal. There is no distension.     Palpations: Abdomen is soft.     Tenderness: There is no abdominal tenderness. There is no guarding.     Hernia: No hernia is present.  Musculoskeletal:        General: Normal range of motion.     Cervical back: Normal range of motion and neck supple.  Skin:    General: Skin is warm and dry.  Neurological:     Mental Status: She is alert and oriented to person, place, and time.  Psychiatric:        Behavior: Behavior normal.        Thought Content: Thought content normal.  BP 140/80 (BP Location: Left Arm, Patient Position: Sitting, Cuff Size: Small)   Pulse 78   Temp 98.6 F (37 C) (Oral)   Resp 20   Ht 5' 2.5" (1.588 m)   Wt 89 lb (40.4 kg)   SpO2 96%   BMI 16.02 kg/m  Wt Readings from Last 3 Encounters:  11/23/20 89 lb (40.4 kg)  07/10/20 87 lb (39.5 kg)  06/21/20 85 lb 6.4 oz (38.7 kg)     Lab Results  Component Value Date   WBC 5.8 11/23/2020   HGB 12.6 11/23/2020   HCT 37.9 11/23/2020   PLT 269.0 11/23/2020   GLUCOSE 111 (H) 11/23/2020   CHOL 160 11/23/2020   TRIG 59.0 11/23/2020   HDL 72.00 11/23/2020   LDLDIRECT 165.1 05/10/2008   LDLCALC 77 11/23/2020   ALT 21 11/23/2020   AST 28 11/23/2020   NA 135 11/23/2020   K 4.2 11/23/2020   CL 98 11/23/2020   CREATININE 1.01 11/23/2020   BUN 15 11/23/2020   CO2 27 11/23/2020   TSH 1.10 11/23/2020   INR 0.89 09/29/2014   HGBA1C 5.8 10/27/2013    MR Abdomen W Wo Contrast  Result Date: 11/17/2020 CLINICAL DATA:  87 year old female with history of abnormal CT examination demonstrating cystic lesion of the pancreas. Follow-up study. EXAM: MRI ABDOMEN WITHOUT AND WITH CONTRAST (INCLUDING MRCP) TECHNIQUE: Multiplanar multisequence MR imaging of the abdomen was performed both before and after the administration of intravenous contrast. Heavily T2-weighted images of the biliary and pancreatic ducts  were obtained, and three-dimensional MRCP images were rendered by post processing. CONTRAST:  49mL GADAVIST GADOBUTROL 1 MMOL/ML IV SOLN COMPARISON:  No prior abdominal MRI. CT the abdomen and pelvis 3 through teen 2022. FINDINGS: Lower chest: Unremarkable. Hepatobiliary: No suspicious cystic or solid hepatic lesions. No intra or extrahepatic biliary ductal dilatation noted on MRCP images. No filling defects in the common bile duct to suggest choledocholithiasis. Common bile duct is normal in caliber measuring 5 mm in the porta hepatis. Small filling defect near the fundus of the gallbladder, compatible with a gallstone. Gallbladder is not distended. No gallbladder wall thickening or surrounding pericholecystic fluid to suggest an acute cholecystitis at this time. Pancreas: In the anterior aspect of the head of the pancreas there are multiple T1 hypointense, T2 hyperintense, nonenhancing lesions, largest of which measures 1.4 cm in diameter (axial image 19 of series 26). These do not appear to communicate with the main pancreatic duct. No solid enhancing pancreatic mass identified. No pancreatic ductal dilatation noted on MRCP images. No peripancreatic fluid collections or inflammatory changes are otherwise noted. Spleen:  Unremarkable. Adrenals/Urinary Tract: Numerous small T1 hypointense, T2 hyperintense, nonenhancing lesions are noted in both kidneys compatible with simple cysts, largest of which is exophytic in the lateral aspect of the upper pole of the right kidney (axial image 22 of series 26) measuring 1.4 cm in diameter. No suspicious renal lesions are noted on postcontrast images. No hydroureteronephrosis in the visualized portions of the abdomen. Bilateral adrenal glands are normal in appearance. Stomach/Bowel: Visualized portions are unremarkable. Vascular/Lymphatic: No aneurysm identified in the visualized abdominal vasculature. No lymphadenopathy noted in the abdomen. Other: No significant volume of  ascites noted in the visualized portions of the peritoneal cavity. Musculoskeletal: No aggressive appearing osseous lesions are noted in the visualized portions of the skeleton. IMPRESSION: 1. Multiple small cystic lesions in the head of the pancreas. The appearance of these lesions is favored to reflect multiple small side branch IPMNs (  intraductal papillary mucinous neoplasms). Repeat abdominal MRI with and without IV gadolinium with MRCP is recommended in 1 year to ensure the stability of this finding. This recommendation follows ACR consensus guidelines: Management of Incidental Pancreatic Cysts: A White Paper of the ACR Incidental Findings Committee. [place] 7902;40:973-532. 2. Additional incidental findings, as above. Electronically Signed   By: Vinnie Langton M.D.   On: 11/17/2020 10:25   MR 3D Recon At Scanner  Result Date: 11/17/2020 CLINICAL DATA:  87 year old female with history of abnormal CT examination demonstrating cystic lesion of the pancreas. Follow-up study. EXAM: MRI ABDOMEN WITHOUT AND WITH CONTRAST (INCLUDING MRCP) TECHNIQUE: Multiplanar multisequence MR imaging of the abdomen was performed both before and after the administration of intravenous contrast. Heavily T2-weighted images of the biliary and pancreatic ducts were obtained, and three-dimensional MRCP images were rendered by post processing. CONTRAST:  63mL GADAVIST GADOBUTROL 1 MMOL/ML IV SOLN COMPARISON:  No prior abdominal MRI. CT the abdomen and pelvis 3 through teen 2022. FINDINGS: Lower chest: Unremarkable. Hepatobiliary: No suspicious cystic or solid hepatic lesions. No intra or extrahepatic biliary ductal dilatation noted on MRCP images. No filling defects in the common bile duct to suggest choledocholithiasis. Common bile duct is normal in caliber measuring 5 mm in the porta hepatis. Small filling defect near the fundus of the gallbladder, compatible with a gallstone. Gallbladder is not distended. No gallbladder  wall thickening or surrounding pericholecystic fluid to suggest an acute cholecystitis at this time. Pancreas: In the anterior aspect of the head of the pancreas there are multiple T1 hypointense, T2 hyperintense, nonenhancing lesions, largest of which measures 1.4 cm in diameter (axial image 19 of series 26). These do not appear to communicate with the main pancreatic duct. No solid enhancing pancreatic mass identified. No pancreatic ductal dilatation noted on MRCP images. No peripancreatic fluid collections or inflammatory changes are otherwise noted. Spleen:  Unremarkable. Adrenals/Urinary Tract: Numerous small T1 hypointense, T2 hyperintense, nonenhancing lesions are noted in both kidneys compatible with simple cysts, largest of which is exophytic in the lateral aspect of the upper pole of the right kidney (axial image 22 of series 26) measuring 1.4 cm in diameter. No suspicious renal lesions are noted on postcontrast images. No hydroureteronephrosis in the visualized portions of the abdomen. Bilateral adrenal glands are normal in appearance. Stomach/Bowel: Visualized portions are unremarkable. Vascular/Lymphatic: No aneurysm identified in the visualized abdominal vasculature. No lymphadenopathy noted in the abdomen. Other: No significant volume of ascites noted in the visualized portions of the peritoneal cavity. Musculoskeletal: No aggressive appearing osseous lesions are noted in the visualized portions of the skeleton. IMPRESSION: 1. Multiple small cystic lesions in the head of the pancreas. 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 Repeat abdominal MRI with and without IV gadolinium with MRCP is recommended in 1 year to ensure the stability of this finding. This recommendation follows ACR consensus guidelines: Management of Incidental Pancreatic Cysts: A White Paper of the ACR Incidental Findings Committee. [place] 9924;26:834-196. 2. Additional incidental findings, as above. Electronically Signed   By: Vinnie Langton M.D.   On: 11/17/2020 10:25     Assessment & Plan:  Plan    Meds ordered this encounter  Medications  . mirtazapine (REMERON) 7.5 MG tablet    Sig: Take 1 tablet (7.5 mg total) by mouth at bedtime.    Dispense:  30 tablet    Refill:  1  Problem List Items Addressed This Visit      Unprioritized   Hyperlipidemia    Tolerating statin, encouraged heart healthy diet, avoid trans fats, minimize simple carbs and saturated fats. Increase exercise as tolerated May consider stopping statin due to weight loss       Relevant Orders   Lipid panel (Completed)   Comprehensive metabolic panel (Completed)   Hypothyroidism    Check labs today con't meds      Relevant Orders   TSH (Completed)   Severe protein-calorie malnutrition ([place]) - Primary    Pt f/u with GI       Relevant Medications   mirtazapine (REMERON) 7.5 MG tablet   Other Relevant Orders   CBC with Differential/Platelet (Completed)   TSH (Completed)      Follow-up: Return in about 3 months (around 02/22/2021), or if symptoms worsen or fail to improve.  . I,Gordon Zheng,acting as a scribe for [employer], DO.,have documented all relevant documentation on the behalf of Ann Held, DO,as directed by  Ann Held, DO while in the presence of [place], DO, have reviewed all documentation for this visit. The documentation on 11/26/20 for the exam, diagnosis, procedures, and orders are all accurate and complete.  Ann Held, DO

## 2020-11-26 ENCOUNTER — Other Ambulatory Visit: Payer: Self-pay | Admitting: Family Medicine

## 2020-11-26 DIAGNOSIS — N1831 Chronic kidney disease, stage 3a: Secondary | ICD-10-CM

## 2020-11-26 DIAGNOSIS — E43 Unspecified severe protein-calorie malnutrition: Secondary | ICD-10-CM | POA: Insufficient documentation

## 2020-11-26 DIAGNOSIS — N1832 Chronic kidney disease, stage 3b: Secondary | ICD-10-CM

## 2020-11-26 NOTE — Assessment & Plan Note (Signed)
Tolerating statin, encouraged heart healthy diet, avoid trans fats, minimize simple carbs and saturated fats. Increase exercise as tolerated May consider stopping statin due to weight loss

## 2020-11-26 NOTE — Assessment & Plan Note (Signed)
Check labs today con't meds 

## 2020-11-26 NOTE — Telephone Encounter (Signed)
We do expect kidney function to decline with age but it was more this time And with her other symptoms it is possible its related--- I will put a nephrology referral in

## 2020-11-26 NOTE — Assessment & Plan Note (Signed)
Pt f/u with GI

## 2020-11-28 ENCOUNTER — Other Ambulatory Visit: Payer: Self-pay

## 2020-11-28 DIAGNOSIS — B3781 Candidal esophagitis: Secondary | ICD-10-CM

## 2020-11-28 MED ORDER — FLUCONAZOLE 100 MG PO TABS: ORAL_TABLET | ORAL | 1 refills | Status: DC

## 2020-12-03 ENCOUNTER — Other Ambulatory Visit (INDEPENDENT_AMBULATORY_CARE_PROVIDER_SITE_OTHER): Payer: Medicare Other

## 2020-12-03 ENCOUNTER — Encounter: Payer: Self-pay | Admitting: Physician Assistant

## 2020-12-03 ENCOUNTER — Other Ambulatory Visit: Payer: Self-pay

## 2020-12-03 ENCOUNTER — Ambulatory Visit: Payer: Medicare Other | Admitting: Physician Assistant

## 2020-12-03 VITALS — BP 116/62 | HR 74 | Ht 62.5 in | Wt 91.0 lb

## 2020-12-03 DIAGNOSIS — R531 Weakness: Secondary | ICD-10-CM | POA: Diagnosis not present

## 2020-12-03 DIAGNOSIS — Z8619 Personal history of other infectious and parasitic diseases: Secondary | ICD-10-CM

## 2020-12-03 DIAGNOSIS — R1013 Epigastric pain: Secondary | ICD-10-CM

## 2020-12-03 DIAGNOSIS — R112 Nausea with vomiting, unspecified: Secondary | ICD-10-CM | POA: Diagnosis not present

## 2020-12-03 DIAGNOSIS — R935 Abnormal findings on diagnostic imaging of other abdominal regions, including retroperitoneum: Secondary | ICD-10-CM

## 2020-12-03 LAB — LIPASE: Lipase: 55 U/L (ref 11.0–59.0)

## 2020-12-03 MED ORDER — OMEPRAZOLE 20 MG PO CPDR: 20.0000 mg | DELAYED_RELEASE_CAPSULE | Freq: Every day | ORAL | 3 refills | Status: DC

## 2020-12-03 NOTE — Progress Notes (Signed)
Chief Complaint: Weight loss, nausea  Review of pertinent gastrointestinal problems:  1. Adenomatous polyp: Colonoscopy January 2012 (age 87) done for fecal occult positive stool, 14 mm adenoma was removed in piecemeal fashion. I felt was completely resected and did not require any further surveillance given her age.  2. GERD:Had allergic (welts, rash) reaction to prilosec ; EGD 12/2013Esophageal yeast infectionMild to moderate pan-gastritis, biopsied to check for H. pylori The examination was otherwise normal; biopsies showed chronic gastritis, no H. Pylori. May, 2014 doing well on twice daily H2 blocker, bedtime Reglan. 02/2013 called with recurrent dysphagia, diflucan restarted empirically. 12/2013 intermittent diflucan treatment helps her swallowing. She started soaking her dentures in nystatin every 7-10 days.EUS 01/2018 found yeast esophagitis again. 3. Incidental pancreatic cyst:No concerning morphologic features based on 2015 AGA criteria. Regardless, EUS 01/2018 found two innocent appearing pancreatic head cysts, one was sampled and fluid testing showed:CEA 640 ng/mL (elevated) Amylase 286 U/L Cytology: no sign of cancer. Recommended continued imaging surveillance (which she gets anyway for renal cancer).   HPI:    Mrs. Ezekiel is a 87 year old Caucasian female with a past medical history as listed below, known to Dr. Ardis Hughs, who was referred to me by Carollee Herter, Alferd Apa, * for a complaint of weight loss and nausea.     07/10/2020 patient seen in clinic by Dr. Ardis Hughs for Hemoccult positive stool.  She also had slight anemia with a hemoglobin of 11.9.  That time discussed that she was elderly and quite frail and would be at increased risk for any kind of invasive testing such as colonoscopy.  It was decided to observe her blood count and trend over the next several months.  Disease continue to drop then could consider colonoscopy again.    11/04/2020 patient presented to the emergency  room with nausea and weakness worsening over the past 3 months.  At that time noted that she had a history of pancreatic mass that appeared to be benign cyst when it was evaluated in 2020.  She was noted to have a slight elevation in her lipase pace, normal liver enzymes and increase in size of her pancreatic mass.  She felt better after IV hydration.  Labs showed a CBC with a hemoglobin of 11.1 and otherwise normal, CMP with a sodium of 130, albumin 3.3, total protein 5.7 otherwise normal.  Lipase 149.  Normal urinalysis, TSH, T4 and troponins.    11/04/2020 CT of the abdomen pelvis with contrast showed mild interval increase in size of the cystic appearing lesion seen within the head of the pancreas.  MRI recommended.  Stable cryoablation site within the mid right kidney, cholelithiasis, nonobstructing renal stones in the left kidney, small hiatal hernia and aortic atherosclerosis.    11/16/2020 MRI of the abdomen with and without contrast showed multiple small cystic lesions in the head of the pancreas.  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    Today, the patient presents to clinic accompanied by her daughter who assists with history.  They explained the patient has battled dehydration on and off for years, but over the past few months symptoms have gotten worse to the point where she was seen in the ER as above because she "could not get up off the toilet".  Her daughter tells me that she has just grown weaker and weaker and is less and less  able to do things for herself at home.  Along with this has been experiencing a lot of nausea.  Her daughter tells me that her GFR has been decreasing over the past few years and "no one has followed up".  When the daughter looked up kidney problems it listed all of the symptoms that the patient was having and they are scheduled to see a  nephrologist within the next month to discuss further.  Patient using Promethazine as needed for nausea which is helpful.    As far as GI symptoms the patient tells me that she does feel like she gets flares of her yeast infection in her throat and when it flares it feels like things "close up", she will take 10 days of Fluconazole and things will resolve for a couple of weeks and then typically will come back in a few weeks after that.  Daughter is wondering if there is anything else we can do to evaluate this because "maybe it is not just yeast".  Patient also describes some epigastric discomfort here and there.  No reflux or heartburn.    Daughter asked questions about elevated lipase in the ER.  She used to work in healthcare for years.    Denies fever, chills, weight loss, blood in her stools or change in bowel habits.  Past Medical History:  Diagnosis Date  . Allergy   . Anemia   . Anxiety   . Cataract 05/2011  . Chronic low blood pressure    due to dehydration  . Depression   . Esophageal disorder   . Esophageal yeast infection ([place])   . GERD (gastroesophageal reflux disease)   . Hearing loss    hearing aids  . Hyperlipidemia   . Hypothyroidism   . Myocardial infarction ([place])   . Osteoporosis   . Renal cell carcinoma ([place])    found on CT  . Thyroid disease    hypothyroidism  . Transfusion history    last 2004    Past Surgical History:  Procedure Laterality Date  . APPENDECTOMY    . BLEPHAROPLASTY Right    x 3 (all related to basal cell carcinoma)  . COLONOSCOPY    . CORONARY ARTERY BYPASS GRAFT     x2 vessel bypass -Dr. Burt Knack -[facility] 06-22-14  . EUS N/A 02/04/2018   Procedure: UPPER ENDOSCOPIC ULTRASOUND (EUS) RADIAL;  Surgeon: Milus Banister, MD;  Location: [facility] END[facility]COPY;  Service: Endoscopy;  Laterality: N/A;  . FINE NEEDLE ASPIRATION N/A 02/04/2018   Procedure: FINE NEEDLE ASPIRATION (FNA) LINEAR;  Surgeon: Milus Banister, MD;  Location: [facility]COPY;   Service: Endoscopy;  Laterality: N/A;  . IR GENERIC HISTORICAL  08/29/2014   IR RADIOLOGIST EVAL & MGMT 08/29/2014 Aletta Edouard, MD [facility] INTERV RAD  . IR GENERIC HISTORICAL  10/22/2016   IR RADIOLOGIST EVAL & MGMT 10/22/2016 Aletta Edouard, MD [facility] INTERV RAD  . IR RADIOLOGIST EVAL & MGMT  11/03/2017  . IR RADIOLOGIST EVAL & MGMT  02/15/2019  . KYPHOSIS SURGERY    . obstructed ureter Left    repair-operative  . RENAL CRYOABLATION Right 09/2014  . TONSILLECTOMY    . TOTAL ABDOMINAL HYSTERECTOMY W/ BILATERAL SALPINGOOPHORECTOMY      Current Outpatient Medications  Medication Sig Dispense Refill  . ALPRAZolam (XANAX) 0.5 MG tablet Take 1 tablet (0.5 mg total) by mouth 2 (two) times daily as needed for anxiety. 180 tablet 0  . aspirin 81 MG EC tablet Take 81 mg by mouth  daily.    . Cholecalciferol (VITAMIN D3) 1000 UNITS CAPS Take 1,000 Units by mouth daily.    . ferrous fumarate (HEMOCYTE - 106 MG FE) 325 (106 Fe) MG TABS tablet Take 325 mg of iron by mouth daily.    . fluconazole (DIFLUCAN) 100 MG tablet TAKE 1 TABLET BY MOUTH  DAILY AS NEEDED FOR YEAST  INFECTION 90 tablet 1  . fluticasone (FLONASE) 50 MCG/ACT nasal spray Place 2 sprays into both nostrils daily as needed for allergies or rhinitis.    . folic acid (FOLVITE) 1 MG tablet Take 1 mg by mouth daily.    . furosemide (LASIX) 40 MG tablet Take 1 tablet (40 mg total) by mouth daily as needed. (Patient taking differently: Take 40 mg by mouth daily as needed for fluid.) 90 tablet 3  . hydroxypropyl methylcellulose / hypromellose (ISOPTO TEARS / GONIOVISC) 2.5 % ophthalmic solution Place 1 drop into both eyes daily as needed for dry eyes.    Marland Kitchen ibuprofen (ADVIL) 200 MG tablet Take 400 mg by mouth every 6 (six) hours as needed for mild pain.    Marland Kitchen levofloxacin (LEVAQUIN) 500 MG tablet Take 1 tablet (500 mg total) by mouth daily. 7 tablet 0  . levothyroxine (SYNTHROID) 50 MCG tablet Take 1 tablet (50 mcg total) by mouth daily. 90 tablet 3  .  mirtazapine (REMERON) 7.5 MG tablet Take 1 tablet (7.5 mg total) by mouth at bedtime. 30 tablet 1  . Multiple Vitamin (MULTIVITAMIN WITH MINERALS) TABS tablet Take 1 tablet by mouth daily.    Marland Kitchen oxymetazoline (AFRIN) 0.05 % nasal spray Place 1 spray into both nostrils daily as needed for congestion.    . potassium chloride SA (KLOR-CON) 20 MEQ tablet Take 1 tablet (20 mEq total) by mouth 2 (two) times daily. 180 tablet 3  . promethazine (PHENERGAN) 25 MG tablet Take 1 tablet (25 mg total) by mouth 2 (two) times daily. 180 tablet 1  . rOPINIRole (REQUIP) 1 MG tablet Take 1 tablet (1 mg total) by mouth at bedtime. 90 tablet 3  . simvastatin (ZOCOR) 20 MG tablet Take 1 tablet (20 mg total) by mouth daily. 90 tablet 3  . tiZANidine (ZANAFLEX) 2 MG tablet Take 2 mg by mouth daily as needed for muscle spasms.    . traMADol (ULTRAM) 50 MG tablet Take 1 tablet (50 mg total) by mouth every 6 (six) hours as needed. for pain 120 tablet 1   No current facility-administered medications for this visit.    Allergies as of 12/03/2020 - Review Complete 11/23/2020  Allergen Reaction Noted  . Zofran [ondansetron hcl] Anaphylaxis and Other (See Comments) 07/23/2012  . Clarithromycin    . Codeine    . Diazepam Other (See Comments) 10/11/2012  . Sulfonamide derivatives    . Prilosec [omeprazole magnesium] Rash 05/26/2011  . Trazodone Anxiety 10/11/2012    Family History  Problem Relation Age of Onset  . Parkinsonism Father   . Hypertension Other   . Heart disease Other        CAD.Marland Kitchen1ST DEGREE RELATIVE <60  . Hyperlipidemia Other   . Breast cancer Sister   . Breast cancer Other   . Colon cancer Neg Hx   . Esophageal cancer Neg Hx   . Rectal cancer Neg Hx   . Stomach cancer Neg Hx     Social History   Socioeconomic History  . Marital status: Widowed    Spouse name: Not on file  . Number of children: 2  .  Years of education: Not on file  . Highest education level: Not on file  Occupational  History  . Occupation: Retired    Fish farm manager: RETIRED  Tobacco Use  . Smoking status: Former Research scientist (life sciences)  . Smokeless tobacco: Never Used  Substance and Sexual Activity  . Alcohol use: No  . Drug use: No  . Sexual activity: Not Currently  Other Topics Concern  . Not on file  Social History Narrative   REG EXERCISE   WIDOW   LIVES ALONE   END OF LIFE:PATIENT HAS LIVING WILL AND CLEARLY STATES SHE DOES NOT WANT CARDIAC [place] VENTILATION OR OTHER HEROIC OR FUTILE MEASURES.   Social Determinants of Health   Financial Resource Strain: Not on file  Food Insecurity: Not on file  Transportation Needs: Not on file  Physical Activity: Not on file  Stress: Not on file  Social Connections: Not on file  Intimate Partner Violence: Not on file    Review of Systems:    Constitutional: No weight loss, fever or chills Cardiovascular: No chest pain, chest pressure or palpitations   Respiratory: No SOB or cough Gastrointestinal: See HPI and otherwise negative   Physical Exam:  Vital signs: BP 116/62   Pulse 74   Ht 5' 2.5" (1.588 m)   Wt 91 lb (41.3 kg)   BMI 16.38 kg/m   Constitutional:   Pleasant frail appearing elderly Caucasian female appears to be in NAD, Well developed, Well nourished, alert and cooperative Respiratory: Respirations even and unlabored. Lungs clear to auscultation bilaterally.   No wheezes, crackles, or rhonchi.  Cardiovascular: Normal S1, S2. No MRG. Regular rate and rhythm. No peripheral edema, cyanosis or pallor.  Gastrointestinal:  Soft, nondistended, nontender. No rebound or guarding. Normal bowel sounds. No appreciable masses or hepatomegaly. Rectal:  Not performed.  Msk:  Symmetrical without gross deformities. Without edema, no deformity or joint abnormality. + In wheelchair Psychiatric: Demonstrates good judgement and reason without abnormal affect or behaviors.  RELEVANT LABS AND IMAGING: CBC    Component Value Date/Time   WBC 5.8  11/23/2020 1203   RBC 4.09 11/23/2020 1203   HGB 12.6 11/23/2020 1203   HGB 12.7 08/01/2011 1006   HCT 37.9 11/23/2020 1203   HCT 37.8 08/01/2011 1006   PLT 269.0 11/23/2020 1203   PLT 276 08/01/2011 1006   MCV 92.8 11/23/2020 1203   MCV 96.3 08/01/2011 1006   MCH 30.7 11/04/2020 1926   MCHC 33.3 11/23/2020 1203   RDW 12.9 11/23/2020 1203   RDW 13.5 08/01/2011 1006   LYMPHSABS 1.5 11/23/2020 1203   LYMPHSABS 1.5 08/01/2011 1006   MONOABS 0.5 11/23/2020 1203   MONOABS 0.5 08/01/2011 1006   EOSABS 0.0 11/23/2020 1203   EOSABS 0.0 08/01/2011 1006   BASOSABS 0.0 11/23/2020 1203   BASOSABS 0.0 08/01/2011 1006    CMP     Component Value Date/Time   NA 135 11/23/2020 1203   K 4.2 11/23/2020 1203   CL 98 11/23/2020 1203   CO2 27 11/23/2020 1203   GLUCOSE 111 (H) 11/23/2020 1203   BUN 15 11/23/2020 1203   CREATININE 1.01 11/23/2020 1203   CREATININE 1.03 (H) 10/18/2016 1147   CALCIUM 9.8 11/23/2020 1203   CALCIUM 11.8 (H) 06/02/2008 0545   PROT 6.5 11/23/2020 1203   ALBUMIN 4.2 11/23/2020 1203   AST 28 11/23/2020 1203   ALT 21 11/23/2020 1203   ALKPHOS 66 11/23/2020 1203   BILITOT 0.5 11/23/2020 1203   GFRNONAA 58 (L) 11/04/2020 1930  GFRNONAA 49 (L) 10/18/2016 1147   GFRAA 58 (L) 07/07/2018 0936   GFRAA 57 (L) 10/18/2016 1147    Assessment: 1.  Fatigue/weakness: Consider relation to kidney function versus other 2.  Nausea/epigastric pain: Almost constant nausea per patient, better with promethazine, occasional epigastric pain; consider gastritis versus other 3.  Elevated lipase: Consider relation to below+/-gastritis/duodenitis 4.  Abnormal MRI of the pancreas: Showing cystic lesions, previously evaluated and benign in 2019  Plan: 1.  Patient still does not want a colonoscopy and her daughter feels like the prep would "take her out".  Did discuss this though as well as an EGD given symptoms that she is reporting now.  I did explain that she is very high risk for these  procedures and prefer not to do them if we do not need to.  Patient and her daughter are in agreement to wait. 2.  Recommend the patient follow with a nephrologist as they are planning to do, pending results from that visit we can decide further if we need to take the risk of procedures. 3.  For now we will recheck lipase. 4.  Ordered a barium esophagram with tablet for further evaluation of dysphagia 5.  Started the patient on Omeprazole 20 mg daily for possible gastritis.  Prescribed #30 with 5 refills. 6.  Continue Promethazine for nausea 7.  Patient/daughter will call us after they see nephrology and give [place] an update.  I did schedule them with Dr. Ardis Hughs at his next available visit in 2 to 3 months so that we have something scheduled.  Ellouise Newer, PA-C [facility] 12/03/2020, 9:56 AM  Cc: Carollee Herter, Alferd Apa, *

## 2020-12-03 NOTE — Patient Instructions (Signed)
If you are age 85 or older, your body mass index should be between 23-30. Your Body mass index is 16.38 kg/m. If this is out of the aforementioned range listed, please consider follow up with your Primary Care Provider.  If you are age 100 or younger, your body mass index should be between 19-25. Your Body mass index is 16.38 kg/m. If this is out of the aformentioned range listed, please consider follow up with your Primary Care Provider.   Your provider has requested that you go to the basement level for lab work before leaving today. Press "B" on the elevator. The lab is located at the first door on the left as you exit the elevator.  We have sent the following medications to your pharmacy for you to pick up at your convenience: Omeprazole 20 mg   Continue Promethazine   Call and let us know what nephrology thinks.  You will be contacted by [place] in the next 2 days to arrange a Barium Swallow.  The number on your caller ID will be [phone], please answer when they call.  If you have not heard from them in 2 days please call [phone] to schedule.    Due to recent changes in healthcare laws, you may see the results of your imaging and laboratory studies on MyChart before your provider has had a chance to review them.  We understand that in some cases there may be results that are confusing or concerning to you. Not all laboratory results come back in the same time frame and the provider may be waiting for multiple results in order to interpret others.  Please give [place] 48 hours in order for your provider to thoroughly review all the results before contacting the office for clarification of your results.   Thank you for choosing me and [facility].  Ellouise Newer, PA-C

## 2020-12-04 NOTE — Progress Notes (Signed)
I agree with the above note, plan 

## 2020-12-11 ENCOUNTER — Ambulatory Visit (HOSPITAL_COMMUNITY)
Admission: RE | Admit: 2020-12-11 | Discharge: 2020-12-11 | Disposition: A | Discharge status: Home / Self Care | Payer: Medicare Other | Source: Ambulatory Visit | Attending: Physician Assistant | Admitting: Physician Assistant

## 2020-12-11 ENCOUNTER — Other Ambulatory Visit: Payer: Self-pay

## 2020-12-11 DIAGNOSIS — R531 Weakness: Secondary | ICD-10-CM | POA: Diagnosis not present

## 2020-12-11 DIAGNOSIS — R1013 Epigastric pain: Secondary | ICD-10-CM | POA: Diagnosis not present

## 2020-12-11 DIAGNOSIS — R112 Nausea with vomiting, unspecified: Secondary | ICD-10-CM | POA: Insufficient documentation

## 2020-12-11 DIAGNOSIS — R1310 Dysphagia, unspecified: Secondary | ICD-10-CM | POA: Diagnosis not present

## 2020-12-11 DIAGNOSIS — Z8619 Personal history of other infectious and parasitic diseases: Secondary | ICD-10-CM | POA: Insufficient documentation

## 2020-12-21 ENCOUNTER — Ambulatory Visit: Payer: Medicare Other | Admitting: Family Medicine

## 2020-12-27 ENCOUNTER — Ambulatory Visit: Payer: Medicare Other | Admitting: Family Medicine

## 2021-01-07 ENCOUNTER — Ambulatory Visit: Payer: Medicare Other | Admitting: Gastroenterology

## 2021-01-08 DIAGNOSIS — Z85528 Personal history of other malignant neoplasm of kidney: Secondary | ICD-10-CM | POA: Diagnosis not present

## 2021-01-08 DIAGNOSIS — N1831 Chronic kidney disease, stage 3a: Secondary | ICD-10-CM | POA: Diagnosis not present

## 2021-01-08 DIAGNOSIS — Z862 Personal history of diseases of the blood and blood-forming organs and certain disorders involving the immune mechanism: Secondary | ICD-10-CM | POA: Diagnosis not present

## 2021-01-08 DIAGNOSIS — I2510 Atherosclerotic heart disease of native coronary artery without angina pectoris: Secondary | ICD-10-CM | POA: Diagnosis not present

## 2021-01-08 LAB — BASIC METABOLIC PANEL
BUN: 18 (ref 4–21)
CO2: 27 — AB (ref 13–22)
Chloride: 99 (ref 99–108)
Creatinine: 1.1 (ref 0.5–1.1)
Glucose: 98
Potassium: 3.5 (ref 3.4–5.3)
Sodium: 139 (ref 137–147)

## 2021-02-11 ENCOUNTER — Ambulatory Visit: Payer: Medicare Other | Admitting: Gastroenterology

## 2021-02-26 ENCOUNTER — Other Ambulatory Visit: Payer: Self-pay

## 2021-02-26 ENCOUNTER — Ambulatory Visit (INDEPENDENT_AMBULATORY_CARE_PROVIDER_SITE_OTHER): Payer: Medicare Other | Admitting: Family Medicine

## 2021-02-26 ENCOUNTER — Encounter: Payer: Self-pay | Admitting: Family Medicine

## 2021-02-26 VITALS — BP 110/70 | HR 96 | Temp 98.8°F | Resp 20 | Ht 62.5 in | Wt 88.2 lb

## 2021-02-26 DIAGNOSIS — N289 Disorder of kidney and ureter, unspecified: Secondary | ICD-10-CM

## 2021-02-26 DIAGNOSIS — J449 Chronic obstructive pulmonary disease, unspecified: Secondary | ICD-10-CM | POA: Insufficient documentation

## 2021-02-26 DIAGNOSIS — I739 Peripheral vascular disease, unspecified: Secondary | ICD-10-CM

## 2021-02-26 DIAGNOSIS — E43 Unspecified severe protein-calorie malnutrition: Secondary | ICD-10-CM

## 2021-02-26 DIAGNOSIS — M159 Polyosteoarthritis, unspecified: Secondary | ICD-10-CM

## 2021-02-26 DIAGNOSIS — M810 Age-related osteoporosis without current pathological fracture: Secondary | ICD-10-CM

## 2021-02-26 DIAGNOSIS — E785 Hyperlipidemia, unspecified: Secondary | ICD-10-CM

## 2021-02-26 DIAGNOSIS — I2510 Atherosclerotic heart disease of native coronary artery without angina pectoris: Secondary | ICD-10-CM

## 2021-02-26 DIAGNOSIS — E039 Hypothyroidism, unspecified: Secondary | ICD-10-CM

## 2021-02-26 DIAGNOSIS — E876 Hypokalemia: Secondary | ICD-10-CM

## 2021-02-26 DIAGNOSIS — B3781 Candidal esophagitis: Secondary | ICD-10-CM

## 2021-02-26 DIAGNOSIS — G2581 Restless legs syndrome: Secondary | ICD-10-CM

## 2021-02-26 DIAGNOSIS — R110 Nausea: Secondary | ICD-10-CM

## 2021-02-26 DIAGNOSIS — F411 Generalized anxiety disorder: Secondary | ICD-10-CM

## 2021-02-26 LAB — LIPID PANEL
Cholesterol: 171 mg/dL (ref 0–200)
HDL: 73.2 mg/dL (ref >39.00)
LDL Cholesterol: 86 mg/dL (ref 0–99)
NonHDL: 97.67
Total CHOL/HDL Ratio: 2
Triglycerides: 60 mg/dL (ref 0.0–149.0)
VLDL: 12 mg/dL (ref 0.0–40.0)

## 2021-02-26 LAB — CBC WITH DIFFERENTIAL/PLATELET
Basophils Absolute: 0 10*3/uL (ref 0.0–0.1)
Basophils Relative: 0.7 % (ref 0.0–3.0)
Eosinophils Absolute: 0.1 10*3/uL (ref 0.0–0.7)
Eosinophils Relative: 1 % (ref 0.0–5.0)
HCT: 36.5 % (ref 36.0–46.0)
Hemoglobin: 12.4 g/dL (ref 12.0–15.0)
Lymphocytes Relative: 29.7 % (ref 12.0–46.0)
Lymphs Abs: 1.7 10*3/uL (ref 0.7–4.0)
MCHC: 34 g/dL (ref 30.0–36.0)
MCV: 92 fl (ref 78.0–100.0)
Monocytes Absolute: 0.5 10*3/uL (ref 0.1–1.0)
Monocytes Relative: 9.3 % (ref 3.0–12.0)
Neutro Abs: 3.3 10*3/uL (ref 1.4–7.7)
Neutrophils Relative %: 59.3 % (ref 43.0–77.0)
Platelets: 330 10*3/uL (ref 150.0–400.0)
RBC: 3.96 Mil/uL (ref 3.87–5.11)
RDW: 13 % (ref 11.5–15.5)
WBC: 5.6 10*3/uL (ref 4.0–10.5)

## 2021-02-26 LAB — COMPREHENSIVE METABOLIC PANEL
ALT: 21 U/L (ref 0–35)
AST: 29 U/L (ref 0–37)
Albumin: 4 g/dL (ref 3.5–5.2)
Alkaline Phosphatase: 62 U/L (ref 39–117)
BUN: 14 mg/dL (ref 6–23)
CO2: 29 mEq/L (ref 19–32)
Calcium: 9.8 mg/dL (ref 8.4–10.5)
Chloride: 99 mEq/L (ref 96–112)
Creatinine, Ser: 1.17 mg/dL (ref 0.40–1.20)
GFR: 40.98 mL/min — ABNORMAL LOW (ref >60.00)
Glucose, Bld: 86 mg/dL (ref 70–99)
Potassium: 4 mEq/L (ref 3.5–5.1)
Sodium: 138 mEq/L (ref 135–145)
Total Bilirubin: 0.6 mg/dL (ref 0.2–1.2)
Total Protein: 6.3 g/dL (ref 6.0–8.3)

## 2021-02-26 LAB — TSH: TSH: 1.08 u[IU]/mL (ref 0.35–5.50)

## 2021-02-26 MED ORDER — LEVOTHYROXINE SODIUM 50 MCG PO TABS: 50.0000 ug | ORAL_TABLET | Freq: Every day | ORAL | 3 refills | Status: DC

## 2021-02-26 MED ORDER — TRAMADOL HCL 50 MG PO TABS: 50.0000 mg | ORAL_TABLET | Freq: Four times a day (QID) | ORAL | 1 refills | Status: DC | PRN

## 2021-02-26 MED ORDER — SIMVASTATIN 20 MG PO TABS: 20.0000 mg | ORAL_TABLET | Freq: Every day | ORAL | 3 refills | Status: DC

## 2021-02-26 MED ORDER — FLUCONAZOLE 100 MG PO TABS: ORAL_TABLET | ORAL | 1 refills | Status: DC

## 2021-02-26 MED ORDER — POTASSIUM CHLORIDE CRYS ER 20 MEQ PO TBCR: 20.0000 meq | EXTENDED_RELEASE_TABLET | Freq: Two times a day (BID) | ORAL | 3 refills | Status: DC

## 2021-02-26 MED ORDER — ROPINIROLE HCL 1 MG PO TABS: 1.0000 mg | ORAL_TABLET | Freq: Every day | ORAL | 3 refills | Status: DC

## 2021-02-26 MED ORDER — PROMETHAZINE HCL 25 MG PO TABS: 25.0000 mg | ORAL_TABLET | Freq: Two times a day (BID) | ORAL | 1 refills | Status: DC

## 2021-02-26 MED ORDER — FUROSEMIDE 40 MG PO TABS: 40.0000 mg | ORAL_TABLET | Freq: Every day | ORAL | 3 refills | Status: DC | PRN

## 2021-02-26 NOTE — Progress Notes (Signed)
+                         Established Patient Office Visit  Subjective:  Patient ID: [name], female    DOB: 11-06-1927  Age: 87 y.o. MRN: [id number]  CC:  Chief Complaint  Patient presents with   Hypothyroidism   Hyperlipidemia   Follow-up    HPI [name] presents for f/u thyroid and cholesterol  She also needs refills on tramadol and diflucan .    No new complaints  Past Medical History:  Diagnosis Date   Allergy    Anemia    Anxiety    Cataract 05/2011   Chronic low blood pressure    due to dehydration   Depression    Esophageal disorder    Esophageal yeast infection (HCC)    GERD (gastroesophageal reflux disease)    Hearing loss    hearing aids   Hyperlipidemia    Hypothyroidism    Myocardial infarction [facility])    Osteoporosis    Renal cell carcinoma (HCC)    found on CT   Thyroid disease    hypothyroidism   Transfusion history    last 2004    Past Surgical History:  Procedure Laterality Date   APPENDECTOMY     BLEPHAROPLASTY Right    x 3 (all related to basal cell carcinoma)   COLONOSCOPY     CORONARY ARTERY BYPASS GRAFT     x2 vessel bypass -Dr. Burt Knack -[place] [facility] 06-22-14   EUS N/A 02/04/2018   Procedure: UPPER ENDOSCOPIC ULTRASOUND (EUS) RADIAL;  Surgeon: Milus Banister, MD;  Location: [facility] ENDOSCOPY;  Service: Endoscopy;  Laterality: N/A;   FINE NEEDLE ASPIRATION N/A 02/04/2018   Procedure: FINE NEEDLE ASPIRATION (FNA) LINEAR;  Surgeon: Milus Banister, MD;  Location: [facility] ENDOS[facility];  Service: Endoscopy;  Laterality: N/A;   IR GENERIC HISTORICAL  08/29/2014   IR RADIOLOGIST EVAL & MGMT 08/29/2014 Aletta Edouard, MD [facility] INTERV RAD   IR GENERIC HISTORICAL  10/22/2016   IR RADIOLOGIST EVAL & MGMT 10/22/2016 Aletta Edouard, MD [facility] INTERV RAD   IR RADIOLOGIST EVAL & MGMT  11/03/2017   IR RADIOLOGIST EVAL & MGMT  02/15/2019   KYPHOSIS SURGERY     obstructed ureter Left    repair-operative   RENAL CRYOABLATION Right  09/2014   TONSILLECTOMY     TOTAL ABDOMINAL HYSTERECTOMY W/ BILATERAL SALPINGOOPHORECTOMY      Family History  Problem Relation Age of Onset   Parkinsonism Father    Hypertension Other    Heart disease Other        CAD.Marland Kitchen1ST DEGREE RELATIVE <60   Hyperlipidemia Other    Breast cancer Sister    Breast cancer Other    Colon cancer Neg Hx    Esophageal cancer Neg Hx    Rectal cancer Neg Hx    Stomach cancer Neg Hx     Social History   Socioeconomic History   Marital status: Widowed    Spouse name: Not on file   Number of children: 2   Years of education: Not on file   Highest education level: Not on file  Occupational History   Occupation: Retired    Fish farm manager: RETIRED  Tobacco Use   Smoking status: Former    Pack years: 0.00   Smokeless tobacco: Never  Substance and Sexual Activity   Alcohol use: No   Drug use: No   Sexual activity: Not Currently  Other  Topics Concern   Not on file  Social History Narrative   REG EXERCISE   WIDOW   LIVES ALONE   END OF LIFE:PATIENT HAS LIVING WILL AND CLEARLY STATES SHE DOES NOT WANT CARDIAC [place] VENTILATION OR OTHER HEROIC OR FUTILE MEASURES.   Social Determinants of Health   Financial Resource Strain: Not on file  Food Insecurity: Not on file  Transportation Needs: Not on file  Physical Activity: Not on file  Stress: Not on file  Social Connections: Not on file  Intimate Partner Violence: Not on file    Outpatient Medications Prior to Visit  Medication Sig Dispense Refill   ALPRAZolam (XANAX) 0.5 MG tablet Take 1 tablet (0.5 mg total) by mouth 2 (two) times daily as needed for anxiety. 180 tablet 0   aspirin 81 MG EC tablet Take 81 mg by mouth daily.     Cholecalciferol (VITAMIN D3) 1000 UNITS CAPS Take 1,000 Units by mouth daily.     ferrous fumarate (HEMOCYTE - 106 MG FE) 325 (106 Fe) MG TABS tablet Take 325 mg of iron by mouth daily.     fluticasone (FLONASE) 50 MCG/ACT nasal spray Place 2 sprays  into both nostrils daily as needed for allergies or rhinitis.     folic acid (FOLVITE) 1 MG tablet Take 1 mg by mouth daily.     hydroxypropyl methylcellulose / hypromellose (ISOPTO TEARS / GONIOVISC) 2.5 % ophthalmic solution Place 1 drop into both eyes daily as needed for dry eyes.     ibuprofen (ADVIL) 200 MG tablet Take 400 mg by mouth every 6 (six) hours as needed for mild pain.     levofloxacin (LEVAQUIN) 500 MG tablet Take 1 tablet (500 mg total) by mouth daily. 7 tablet 0   Multiple Vitamin (MULTIVITAMIN WITH MINERALS) TABS tablet Take 1 tablet by mouth daily.     oxymetazoline (AFRIN) 0.05 % nasal spray Place 1 spray into both nostrils daily as needed for congestion.     tiZANidine (ZANAFLEX) 2 MG tablet Take 2 mg by mouth daily as needed for muscle spasms.     fluconazole (DIFLUCAN) 100 MG tablet TAKE 1 TABLET BY MOUTH  DAILY AS NEEDED FOR YEAST  INFECTION 90 tablet 1   furosemide (LASIX) 40 MG tablet Take 1 tablet (40 mg total) by mouth daily as needed. (Patient taking differently: Take 40 mg by mouth daily as needed for fluid.) 90 tablet 3   levothyroxine (SYNTHROID) 50 MCG tablet Take 1 tablet (50 mcg total) by mouth daily. 90 tablet 3   potassium chloride SA (KLOR-CON) 20 MEQ tablet Take 1 tablet (20 mEq total) by mouth 2 (two) times daily. 180 tablet 3   promethazine (PHENERGAN) 25 MG tablet Take 1 tablet (25 mg total) by mouth 2 (two) times daily. 180 tablet 1   rOPINIRole (REQUIP) 1 MG tablet Take 1 tablet (1 mg total) by mouth at bedtime. 90 tablet 3   simvastatin (ZOCOR) 20 MG tablet Take 1 tablet (20 mg total) by mouth daily. 90 tablet 3   traMADol (ULTRAM) 50 MG tablet Take 1 tablet (50 mg total) by mouth every 6 (six) hours as needed. for pain 120 tablet 1   omeprazole (PRILOSEC) 20 MG capsule Take 1 capsule (20 mg total) by mouth daily. Take one capsule daily 30-60 minutes before breakfast. 30 capsule 3   No facility-administered medications prior to visit.    Allergies   Allergen Reactions   Zofran [Ondansetron Hcl] Anaphylaxis and Other (See Comments)  Tongue swelling, lost of voice   Clarithromycin     Pt does not remember the reaction   Codeine     Per the pt, "It made me disoriented"   Diazepam Other (See Comments)    Per the pt, "I don't remember the reaction"   Sulfonamide Derivatives     Per the pt, "It made me disoriented"   Prilosec [Omeprazole Magnesium] Rash   Trazodone Anxiety    ROS Review of Systems  Constitutional:  Negative for appetite change, diaphoresis, fatigue and unexpected weight change.  Eyes:  Negative for pain, redness and visual disturbance.  Respiratory:  Negative for cough, chest tightness, shortness of breath and wheezing.   Cardiovascular:  Negative for chest pain, palpitations and leg swelling.  Endocrine: Negative for cold intolerance, heat intolerance, polydipsia, polyphagia and polyuria.  Genitourinary:  Negative for difficulty urinating, dysuria and frequency.  Neurological:  Negative for dizziness, light-headedness, numbness and headaches.     Objective:    Physical Exam Vitals and nursing note reviewed.  Constitutional:      Appearance: She is well-developed.  HENT:     Head: Normocephalic and atraumatic.  Eyes:     Conjunctiva/sclera: Conjunctivae normal.  Neck:     Thyroid: No thyromegaly.     Vascular: No carotid bruit or JVD.  Cardiovascular:     Rate and Rhythm: Normal rate and regular rhythm.     Heart sounds: Normal heart sounds. No murmur heard. Pulmonary:     Effort: Pulmonary effort is normal. No respiratory distress.     Breath sounds: Normal breath sounds. No wheezing or rales.  Chest:     Chest wall: No tenderness.  Musculoskeletal:     Cervical back: Normal range of motion and neck supple.  Neurological:     Mental Status: She is alert and oriented to person, place, and time.    BP 110/70 (BP Location: Left Arm, Patient Position: Sitting, Cuff Size: Normal)   Pulse 96    Temp 98.8 F (37.1 C) (Oral)   Resp 20   Ht 5' 2.5" (1.588 m)   Wt 88 lb 3.2 oz (40 kg)   SpO2 98%   BMI 15.87 kg/m  Wt Readings from Last 3 Encounters:  02/26/21 88 lb 3.2 oz (40 kg)  12/03/20 91 lb (41.3 kg)  11/23/20 89 lb (40.4 kg)     There are no preventive care reminders to display for this patient.   There are no preventive care reminders to display for this patient.  Lab Results  Component Value Date   TSH 1.10 11/23/2020   Lab Results  Component Value Date   WBC 5.8 11/23/2020   HGB 12.6 11/23/2020   HCT 37.9 11/23/2020   MCV 92.8 11/23/2020   PLT 269.0 11/23/2020   Lab Results  Component Value Date   NA 139 01/08/2021   K 3.5 01/08/2021   CO2 27 (A) 01/08/2021   GLUCOSE 111 (H) 11/23/2020   BUN 18 01/08/2021   CREATININE 1.1 01/08/2021   BILITOT 0.5 11/23/2020   ALKPHOS 66 11/23/2020   AST 28 11/23/2020   ALT 21 11/23/2020   PROT 6.5 11/23/2020   ALBUMIN 4.2 11/23/2020   CALCIUM 9.8 11/23/2020   ANIONGAP 9 11/04/2020   GFR 48.98 (L) 11/23/2020   Lab Results  Component Value Date   CHOL 160 11/23/2020   Lab Results  Component Value Date   HDL 72.00 11/23/2020   Lab Results  Component Value Date   LDLCALC  77 11/23/2020   Lab Results  Component Value Date   TRIG 59.0 11/23/2020   Lab Results  Component Value Date   CHOLHDL 2 11/23/2020   Lab Results  Component Value Date   HGBA1C 5.8 10/27/2013      Assessment & Plan:   Problem List Items Addressed This Visit       Unprioritized   Osteoarthritis of multiple joints   Relevant Medications   traMADol (ULTRAM) 50 MG tablet   Anxiety state    Stable She does not need refill alprazolam        Atherosclerosis of native coronary artery of native heart without angina pectoris    On statin         Relevant Medications   furosemide (LASIX) 40 MG tablet   simvastatin (ZOCOR) 20 MG tablet   Chronic obstructive pulmonary disease, unspecified COPD type (HCC)    stable        Relevant Medications   promethazine (PHENERGAN) 25 MG tablet   Hyperlipidemia    Tolerating statin, encouraged heart healthy diet, avoid trans fats, minimize simple carbs and saturated fats. Increase exercise as tolerated       Relevant Medications   furosemide (LASIX) 40 MG tablet   simvastatin (ZOCOR) 20 MG tablet   Other Relevant Orders   TSH   Lipid panel   Comprehensive metabolic panel   CBC with Differential/Platelet   Hypothyroidism - Primary    con't synthroid  Check labs today       Relevant Medications   levothyroxine (SYNTHROID) 50 MCG tablet   Other Relevant Orders   TSH   Lipid panel   Comprehensive metabolic panel   CBC with Differential/Platelet   Osteoporosis    crcl <30---  Not a good candidate for prolia con't calcium , vita d        PVD (peripheral vascular disease) (HCC)   Relevant Medications   furosemide (LASIX) 40 MG tablet   simvastatin (ZOCOR) 20 MG tablet   RLS (restless legs syndrome)    Stable con't requip       Relevant Medications   rOPINIRole (REQUIP) 1 MG tablet   Severe protein-calorie malnutrition (HCC)    stable       Other Visit Diagnoses     Kidney function abnormal       Relevant Medications   furosemide (LASIX) 40 MG tablet   Nausea       Relevant Medications   promethazine (PHENERGAN) 25 MG tablet   Hypokalemia       Relevant Medications   potassium chloride SA (KLOR-CON) 20 MEQ tablet   Esophageal candidiasis (HCC)       Relevant Medications   fluconazole (DIFLUCAN) 100 MG tablet       Meds ordered this encounter  Medications   levothyroxine (SYNTHROID) 50 MCG tablet    Sig: Take 1 tablet (50 mcg total) by mouth daily.    Dispense:  90 tablet    Refill:  3    Requesting 1 year supply   traMADol (ULTRAM) 50 MG tablet    Sig: Take 1 tablet (50 mg total) by mouth every 6 (six) hours as needed. for pain    Dispense:  120 tablet    Refill:  1   furosemide (LASIX) 40 MG tablet    Sig: Take 1  tablet (40 mg total) by mouth daily as needed.    Dispense:  90 tablet    Refill:  3    Requesting  1 year supply   promethazine (PHENERGAN) 25 MG tablet    Sig: Take 1 tablet (25 mg total) by mouth 2 (two) times daily.    Dispense:  180 tablet    Refill:  1   potassium chloride SA (KLOR-CON) 20 MEQ tablet    Sig: Take 1 tablet (20 mEq total) by mouth 2 (two) times daily.    Dispense:  180 tablet    Refill:  3    Requesting 1 year supply   simvastatin (ZOCOR) 20 MG tablet    Sig: Take 1 tablet (20 mg total) by mouth daily.    Dispense:  90 tablet    Refill:  3    Requesting 1 year supply   rOPINIRole (REQUIP) 1 MG tablet    Sig: Take 1 tablet (1 mg total) by mouth at bedtime.    Dispense:  90 tablet    Refill:  3    Requesting 1 year supply   fluconazole (DIFLUCAN) 100 MG tablet    Sig: TAKE 1 TABLET BY MOUTH  DAILY AS NEEDED FOR YEAST  INFECTION    Dispense:  90 tablet    Refill:  1    Follow-up: Return in about 6 months (around 08/29/2021), or if symptoms worsen or fail to improve, for fasting, annual exam.    Ann Held, DO

## 2021-02-26 NOTE — Assessment & Plan Note (Signed)
stable °

## 2021-02-26 NOTE — Assessment & Plan Note (Signed)
Stable con't requip

## 2021-02-26 NOTE — Assessment & Plan Note (Signed)
Stable She does not need refill alprazolam

## 2021-02-26 NOTE — Assessment & Plan Note (Signed)
crcl <30---  Not a good candidate for prolia con't calcium , vita d

## 2021-02-26 NOTE — Patient Instructions (Signed)

## 2021-02-26 NOTE — Assessment & Plan Note (Signed)
con't synthroid  Check labs today

## 2021-02-26 NOTE — Assessment & Plan Note (Signed)
On statin.

## 2021-02-26 NOTE — Assessment & Plan Note (Signed)
Tolerating statin, encouraged heart healthy diet, avoid trans fats, minimize simple carbs and saturated fats. Increase exercise as tolerated 

## 2021-03-01 ENCOUNTER — Telehealth: Payer: Self-pay

## 2021-03-01 NOTE — Telephone Encounter (Signed)
Key: [id number] - Rx #: [id number]  PA sent to insurance today

## 2021-03-04 NOTE — Telephone Encounter (Signed)
Outcome Denied on July 8

## 2021-03-04 NOTE — Progress Notes (Signed)
Subjective:   [name] is a 87 y.o. female who presents for Medicare Annual (Subsequent) preventive examination.  I connected with Kailia today by telephone and verified that I am speaking with the correct person using two identifiers. Location patient: home Location provider: work Persons participating in the virtual visit: patient, Marine scientist.    I discussed the limitations, risks, security and privacy concerns of performing an evaluation and management service by telephone and the availability of in person appointments. I also discussed with the patient that there may be a patient responsible charge related to this service. The patient expressed understanding and verbally consented to this telephonic visit.    Interactive audio and video telecommunications were attempted between this provider and patient, however failed, due to patient having technical difficulties OR patient did not have access to video capability.  We continued and completed visit with audio only.  Some vital signs may be absent or patient reported.   Time Spent with patient on telephone encounter: 20 minutes   Review of Systems     Cardiac Risk Factors include: advanced age (>30men, >32 women);dyslipidemia     Objective:    Today's Vitals   03/05/21 0822  Weight: 88 lb (39.9 kg)  Height: 5' 2.5" (1.588 m)   Body mass index is 15.84 kg/m.  Advanced Directives 03/05/2021 05/12/2019 07/07/2018 02/04/2018 11/05/2017 10/23/2016 03/19/2016  Does Patient Have a Medical Advance Directive? Yes Yes No Yes Yes Yes Yes  Type of Paramedic of [place];Living will [place];Living will - [place];Living will [place];Living will [place];Living will [place];Living will  Does patient want to make changes to medical advance directive? - No - Patient declined - - - - -  Copy of [place]  in Chart? Yes - validated most recent copy scanned in chart (See row information) Yes - validated most recent copy scanned in chart (See row information) - Yes Yes Yes No - copy requested  Would patient like information on creating a medical advance directive? - - Yes (MAU/Ambulatory/Procedural Areas - Information given) - - - -    Current Medications (verified) Outpatient Encounter Medications as of 03/05/2021  Medication Sig   ALPRAZolam (XANAX) 0.5 MG tablet Take 1 tablet (0.5 mg total) by mouth 2 (two) times daily as needed for anxiety.   aspirin 81 MG EC tablet Take 81 mg by mouth daily.   Cholecalciferol (VITAMIN D3) 1000 UNITS CAPS Take 1,000 Units by mouth daily.   ferrous fumarate (HEMOCYTE - 106 MG FE) 325 (106 Fe) MG TABS tablet Take 325 mg of iron by mouth daily.   fluconazole (DIFLUCAN) 100 MG tablet TAKE 1 TABLET BY MOUTH  DAILY AS NEEDED FOR YEAST  INFECTION   fluticasone (FLONASE) 50 MCG/ACT nasal spray Place 2 sprays into both nostrils daily as needed for allergies or rhinitis.   folic acid (FOLVITE) 1 MG tablet Take 1 mg by mouth daily.   furosemide (LASIX) 40 MG tablet Take 1 tablet (40 mg total) by mouth daily as needed.   hydroxypropyl methylcellulose / hypromellose (ISOPTO TEARS / GONIOVISC) 2.5 % ophthalmic solution Place 1 drop into both eyes daily as needed for dry eyes.   ibuprofen (ADVIL) 200 MG tablet Take 400 mg by mouth every 6 (six) hours as needed for mild pain.   levofloxacin (LEVAQUIN) 500 MG tablet Take 1 tablet (500 mg total) by mouth daily.   levothyroxine (  SYNTHROID) 50 MCG tablet Take 1 tablet (50 mcg total) by mouth daily.   Multiple Vitamin (MULTIVITAMIN WITH MINERALS) TABS tablet Take 1 tablet by mouth daily.   oxymetazoline (AFRIN) 0.05 % nasal spray Place 1 spray into both nostrils daily as needed for congestion.   potassium chloride SA (KLOR-CON) 20 MEQ tablet Take 1 tablet (20 mEq total) by mouth 2 (two) times daily.   promethazine (PHENERGAN) 25 MG  tablet Take 1 tablet (25 mg total) by mouth 2 (two) times daily.   rOPINIRole (REQUIP) 1 MG tablet Take 1 tablet (1 mg total) by mouth at bedtime.   simvastatin (ZOCOR) 20 MG tablet Take 1 tablet (20 mg total) by mouth daily.   tiZANidine (ZANAFLEX) 2 MG tablet Take 2 mg by mouth daily as needed for muscle spasms.   traMADol (ULTRAM) 50 MG tablet Take 1 tablet (50 mg total) by mouth every 6 (six) hours as needed. for pain   No facility-administered encounter medications on file as of 03/05/2021.    Allergies (verified) Zofran [ondansetron hcl], Clarithromycin, Codeine, Diazepam, Sulfonamide derivatives, Prilosec [omeprazole magnesium], and Trazodone   History: Past Medical History:  Diagnosis Date   Allergy    Anemia    Anxiety    Cataract 05/2011   Chronic low blood pressure    due to dehydration   Depression    Esophageal disorder    Esophageal yeast infection ([place])    GERD (gastroesophageal reflux disease)    Hearing loss    hearing aids   Hyperlipidemia    Hypothyroidism    Myocardial infarction [facility])    Osteoporosis    Renal cell carcinoma ([place])    found on CT   Thyroid disease    hypothyroidism   Transfusion history    last 2004   Past Surgical History:  Procedure Laterality Date   APPENDECTOMY     BLEPHAROPLASTY Right    x 3 (all related to basal cell carcinoma)   COLONOSCOPY     CORONARY ARTERY BYPASS GRAFT     x2 vessel bypass -Dr. Burt Knack -[place] LOV 06-22-14   EUS N/A 02/04/2018   Procedure: UPPER ENDOSCOPIC ULTRASOUND (EUS) RADIAL;  Surgeon: Milus Banister, MD;  Location: [facility] END[facility]COPY;  Service: Endoscopy;  Laterality: N/A;   FINE NEEDLE ASPIRATION N/A 02/04/2018   Procedure: FINE NEEDLE ASPIRATION (FNA) LINEAR;  Surgeon: Milus Banister, MD;  Location: [facility];  Service: Endoscopy;  Laterality: N/A;   IR GENERIC HISTORICAL  08/29/2014   IR RADIOLOGIST EVAL & MGMT 08/29/2014 Aletta Edouard, MD [facility] INTERV RAD   IR GENERIC HISTORICAL  10/22/2016   IR  RADIOLOGIST EVAL & MGMT 10/22/2016 Aletta Edouard, MD [facility] INTERV RAD   IR RADIOLOGIST EVAL & MGMT  11/03/2017   IR RADIOLOGIST EVAL & MGMT  02/15/2019   KYPHOSIS SURGERY     obstructed ureter Left    repair-operative   RENAL CRYOABLATION Right 09/2014   TONSILLECTOMY     TOTAL ABDOMINAL HYSTERECTOMY W/ BILATERAL SALPINGOOPHORECTOMY     Family History  Problem Relation Age of Onset   Parkinsonism Father    Hypertension Other    Heart disease Other        CAD.Marland Kitchen1ST DEGREE RELATIVE <60   Hyperlipidemia Other    Breast cancer Sister    Breast cancer Other    Colon cancer Neg Hx    Esophageal cancer Neg Hx    Rectal cancer Neg Hx    Stomach cancer Neg Hx    Social  History   Socioeconomic History   Marital status: Widowed    Spouse name: Not on file   Number of children: 2   Years of education: Not on file   Highest education level: Not on file  Occupational History   Occupation: Retired    Fish farm manager: RETIRED  Tobacco Use   Smoking status: Former    Pack years: 0.00   Smokeless tobacco: Never  Substance and Sexual Activity   Alcohol use: No   Drug use: No   Sexual activity: Not Currently  Other Topics Concern   Not on file  Social History Narrative   REG EXERCISE   WIDOW   LIVES ALONE   END OF [place] HEROIC OR FUTILE MEASURES.   Social Determinants of Health   Financial Resource Strain: Low Risk    Difficulty of Paying Living Expenses: Not hard at all  Food Insecurity: No Food Insecurity   Worried About Charity fundraiser in the Last Year: Never true   [place] in the Last Year: Never true  Transportation Needs: No Transportation Needs   Lack of Transportation (Medical): No   Lack of Transportation (Non-Medical): No  Physical Activity: Sufficiently Active   Days of Exercise per Week: 5 days   Minutes of Exercise per Session: 50 min  Stress: No  Stress Concern Present   Feeling of Stress : Not at all  Social Connections: Moderately Isolated   Frequency of Communication with Friends and Family: More than three times a week   Frequency of Social Gatherings with Friends and Family: More than three times a week   Attends Religious Services: More than 4 times per year   Active Member of [employer] or Organizations: No   Attends Archivist Meetings: Never   Marital Status: Widowed    Tobacco Counseling Counseling given: Not Answered   Clinical Intake:  Pre-visit preparation completed: Yes  Pain : No/denies pain     Nutritional Status: BMI <19  Underweight Nutritional Risks: None Diabetes: No  How often do you need to have someone help you when you read instructions, pamphlets, or other written materials from your doctor or pharmacy?: 1 - Never  Diabetic?No  Interpreter Needed?: No  Information entered by :: Caroleen Hamman LPN   Activities of Daily Living In your present state of health, do you have any difficulty performing the following activities: 03/05/2021 06/21/2020  Hearing? N Y  Vision? N N  Difficulty concentrating or making decisions? N Y  Walking or climbing stairs? N Y  Dressing or bathing? N N  Doing errands, shopping? N Y  Conservation officer, nature and eating ? N -  Using the Toilet? N -  In the past six months, have you accidently leaked urine? N -  Do you have problems with loss of bowel control? N -  Managing your Medications? N -  Managing your Finances? N -  Housekeeping or managing your Housekeeping? N -  Some recent data might be hidden    Patient Care Team: Carollee Herter, Alferd Apa, DO as PCP - General Milus Banister, MD as Consulting Physician (Gastroenterology) Aletta Edouard, MD as Consulting Physician (Interventional Radiology) [place], Amy, MD as Consulting Physician (Dermatology) Sherren Mocha, MD as Consulting Physician (Cardiology) Monna Fam, MD as Consulting Physician  (Ophthalmology)  Indicate any recent Medical Services you may have received from other than [facility] providers in the past  year (date may be approximate).     Assessment:   This is a routine wellness examination for Annalissa.  Hearing/Vision screen Hearing Screening - Comments:: Deaf in left ear Hearing aid in right ear Vision Screening - Comments:: Reading glasses Last eye exam-10/2020-Dr. Herbert Deaner  Dietary issues and exercise activities discussed: Current Exercise Habits: Home exercise routine, Type of exercise: walking, Time (Minutes): 50, Frequency (Times/Week): 5, Weekly Exercise (Minutes/Week): 250, Intensity: Mild, Exercise limited by: None identified   Goals Addressed             This Visit's Progress    Maintain current health and independence   On track    Patient Stated       Drink more water        Depression Screen PHQ 2/9 Scores 03/05/2021 05/12/2019 11/05/2017 10/23/2016 09/27/2015 09/27/2015 08/11/2014  PHQ - 2 Score 0 0 0 0 0 0 0    Fall Risk Fall Risk  03/05/2021 06/21/2020 05/12/2019 11/05/2017 11/05/2017  Falls in the past year? 0 0 1 No Yes  Number falls in past yr: 0 0 0 - -  Comment - - - - -  Injury with Fall? 0 0 1 - No  Risk Factor Category  - - - - -  Risk for fall due to : - - - - Other (Comment);Impaired balance/gait  Follow up Falls prevention discussed Falls evaluation completed - - -    FALL RISK PREVENTION PERTAINING TO THE HOME:  Any stairs in or around the home? No  Home free of loose throw rugs in walkways, pet beds, electrical cords, etc? Yes  Adequate lighting in your home to reduce risk of falls? Yes   ASSISTIVE DEVICES UTILIZED TO PREVENT FALLS:  Life alert? No  Use of a cane, walker or w/c? Yes  Grab bars in the bathroom? Yes  Shower chair or bench in shower? Yes  Elevated toilet seat or a handicapped toilet? No   TIMED UP AND GO:  Was the test performed? No . Phone visit   Cognitive Function:Normal cognitive status assessed by this  Nurse Health Advisor. No abnormalities found.   MMSE - Mini Mental State Exam 11/05/2017 10/23/2016  Orientation to time 5 5  Orientation to Place 5 5  Registration 3 3  Attention/ Calculation 5 5  Recall 3 3  Language- name 2 objects 2 2  Language- repeat 1 1  Language- follow 3 step command 3 3  Language- read & follow direction 1 1  Write a sentence 1 1  Copy design 1 1  Total score 30 30     6CIT Screen 05/12/2019  What Year? 0 points  What month? 0 points  What time? 0 points  Count back from 20 0 points  Months in reverse 0 points  Repeat phrase 0 points  Total Score 0    Immunizations Immunization History  Administered Date(s) Administered   Influenza Split 05/26/2011, 05/31/2012   Influenza Whole 05/31/2008, 05/17/2009, 05/22/2010   Influenza, High Dose Seasonal PF 05/21/2014, 04/30/2015, 05/18/2016, 05/09/2018, 04/22/2019   Influenza,inj,Quad PF,6+ Mos 06/06/2013   Influenza-Unspecified 04/09/2017, 05/09/2018, 05/21/2020   PFIZER(Purple Top)SARS-COV-2 Vaccination 10/01/2019, 10/26/2019, 05/21/2020, 02/13/2021   Pneumococcal Conjugate-13 08/11/2014, 04/22/2019   Pneumococcal Polysaccharide-23 06/11/2005   Tdap 06/08/2016    TDAP status: Up to date  Flu Vaccine status: Up to date  Pneumococcal vaccine status: Up to date  Covid-19 vaccine status: Completed vaccines  Qualifies for Shingles Vaccine? Yes   Zostavax completed No  Shingrix Completed?: No.    Education has been provided regarding the importance of this vaccine. Patient has been advised to call insurance company to determine out of pocket expense if they have not yet received this vaccine. Advised may also receive vaccine at local pharmacy or Health Dept. Verbalized acceptance and understanding.  Screening Tests Health Maintenance  Topic Date Due   Zoster Vaccines- Shingrix (1 of 2) 05/29/2021 (Originally 04/21/1947)   INFLUENZA VACCINE  03/25/2021   TETANUS/TDAP  06/08/2026   DEXA SCAN   Completed   COVID-19 Vaccine  Completed   PNA vac Low Risk Adult  Completed   HPV VACCINES  Aged Out    Health Maintenance  There are no preventive care reminders to display for this patient.  Colorectal cancer screening: No longer required.   Mammogram status: No longer required due to Patient declines due to age.  Done Density status: Declined  Lung Cancer Screening: (Low Dose CT Chest recommended if Age 55-80 years, 30 pack-year currently smoking OR have quit w/in 15years.) does not qualify.     Additional Screening:  Hepatitis C Screening: does not qualify  Vision Screening: Recommended annual ophthalmology exams for early detection of glaucoma and other disorders of the eye. Is the patient up to date with their annual eye exam?  Yes  Who is the provider or what is the name of the office in which the patient attends annual eye exams? Dr. Herbert Deaner   Dental Screening: Recommended annual dental exams for proper oral hygiene  Community Resource Referral / Chronic Care Management: CRR required this visit?  No   CCM required this visit?  No      Plan:     I have personally reviewed and noted the following in the patient's chart:   Medical and social history Use of alcohol, tobacco or illicit drugs  Current medications and supplements including opioid prescriptions.  Functional ability and status Nutritional status Physical activity Advanced directives List of other physicians Hospitalizations, surgeries, and ER visits in previous 12 months Vitals Screenings to include cognitive, depression, and falls Referrals and appointments  In addition, I have reviewed and discussed with patient certain preventive protocols, quality metrics, and best practice recommendations. A written personalized care plan for preventive services as well as general preventive health recommendations were provided to patient.   Due to this being a telephonic visit, the after visit summary  with patients personalized plan was offered to patient via mail or my-chart. Patient would like to access on my-chart.   Marta Antu, LPN   [id number]  Nurse Health Advisor  Nurse Notes: None

## 2021-03-05 ENCOUNTER — Ambulatory Visit (INDEPENDENT_AMBULATORY_CARE_PROVIDER_SITE_OTHER): Payer: Medicare Other

## 2021-03-05 VITALS — Ht 62.5 in | Wt 88.0 lb

## 2021-03-05 DIAGNOSIS — Z0000 Encounter for general adult medical examination without abnormal findings: Secondary | ICD-10-CM

## 2021-03-05 NOTE — Patient Instructions (Signed)
[name] , Thank you for taking time to complete your Medicare Wellness Visit. I appreciate your ongoing commitment to your health goals. Please review the following plan we discussed and let me know if I can assist you in the future.   Screening recommendations/referrals: Colonoscopy: No longer required Mammogram: Declined Bone Density: Declined Recommended yearly ophthalmology/optometry visit for glaucoma screening and checkup Recommended yearly dental visit for hygiene and checkup  Vaccinations: Influenza vaccine: Up to date Pneumococcal vaccine: Up to date Tdap vaccine: Up to date-Due-06/08/2026 Shingles vaccine: Declined   Covid-19:Up to date  Advanced directives: Copy in chart  Conditions/risks identified: See problem list  Next appointment: Follow up in one year for your annual wellness visit 03/10/2022 @ 8:20   Preventive Care 65 Years and Older, Female Preventive care refers to lifestyle choices and visits with your health care provider that can promote health and wellness. What does preventive care include? A yearly physical exam. This is also called an annual well check. Dental exams once or twice a year. Routine eye exams. Ask your health care provider how often you should have your eyes checked. Personal lifestyle choices, including: Daily care of your teeth and gums. Regular physical activity. Eating a healthy diet. Avoiding tobacco and drug use. Limiting alcohol use. Practicing safe sex. Taking low-dose aspirin every day. Taking vitamin and mineral supplements as recommended by your health care provider. What happens during an annual well check? The services and screenings done by your health care provider during your annual well check will depend on your age, overall health, lifestyle risk factors, and family history of disease. Counseling  Your health care provider may ask you questions about your: Alcohol use. Tobacco use. Drug use. Emotional  well-being. Home and relationship well-being. Sexual activity. Eating habits. History of falls. Memory and ability to understand (cognition). Work and work Statistician. Reproductive health. Screening  You may have the following tests or measurements: Height, weight, and BMI. Blood pressure. Lipid and cholesterol levels. These may be checked every 5 years, or more frequently if you are over 71 years old. Skin check. Lung cancer screening. You may have this screening every year starting at age 68 if you have a 30-pack-year history of smoking and currently smoke or have quit within the past 15 years. Fecal occult blood test (FOBT) of the stool. You may have this test every year starting at age 65. Flexible sigmoidoscopy or colonoscopy. You may have a sigmoidoscopy every 5 years or a colonoscopy every 10 years starting at age 52. Hepatitis C blood test. Hepatitis B blood test. Sexually transmitted disease (STD) testing. Diabetes screening. This is done by checking your blood sugar (glucose) after you have not eaten for a while (fasting). You may have this done every 1-3 years. Bone density scan. This is done to screen for osteoporosis. You may have this done starting at age 78. Mammogram. This may be done every 1-2 years. Talk to your health care provider about how often you should have regular mammograms. Talk with your health care provider about your test results, treatment options, and if necessary, the need for more tests. Vaccines  Your health care provider may recommend certain vaccines, such as: Influenza vaccine. This is recommended every year. Tetanus, diphtheria, and acellular pertussis (Tdap, Td) vaccine. You may need a Td booster every 10 years. Zoster vaccine. You may need this after age 89. Pneumococcal 13-valent conjugate (PCV13) vaccine. One dose is recommended after age 69. Pneumococcal polysaccharide (PPSV23) vaccine. One dose is recommended  after age 32. Talk to your  health care provider about which screenings and vaccines you need and how often you need them. This information is not intended to replace advice given to you by your health care provider. Make sure you discuss any questions you have with your health care provider. Document Released: 09/07/2015 Document Revised: 04/30/2016 Document Reviewed: 06/12/2015 Elsevier Interactive Patient Education  2017 [place] Prevention in the Home Falls can cause injuries. They can happen to people of all ages. There are many things you can do to make your home safe and to help prevent falls. What can I do on the outside of my home? Regularly fix the edges of walkways and driveways and fix any cracks. Remove anything that might make you trip as you walk through a door, such as a raised step or threshold. Trim any bushes or trees on the path to your home. Use bright outdoor lighting. Clear any walking paths of anything that might make someone trip, such as rocks or tools. Regularly check to see if handrails are loose or broken. Make sure that both sides of any steps have handrails. Any raised decks and porches should have guardrails on the edges. Have any leaves, snow, or ice cleared regularly. Use sand or salt on walking paths during winter. Clean up any spills in your garage right away. This includes oil or grease spills. What can I do in the bathroom? Use night lights. Install grab bars by the toilet and in the tub and shower. Do not use towel bars as grab bars. Use non-skid mats or decals in the tub or shower. If you need to sit down in the shower, use a plastic, non-slip stool. Keep the floor dry. Clean up any water that spills on the floor as soon as it happens. Remove soap buildup in the tub or shower regularly. Attach bath mats securely with double-sided non-slip rug tape. Do not have throw rugs and other things on the floor that can make you trip. What can I do in the bedroom? Use night  lights. Make sure that you have a light by your bed that is easy to reach. Do not use any sheets or blankets that are too big for your bed. They should not hang down onto the floor. Have a firm chair that has side arms. You can use this for support while you get dressed. Do not have throw rugs and other things on the floor that can make you trip. What can I do in the kitchen? Clean up any spills right away. Avoid walking on wet floors. Keep items that you use a lot in easy-to-reach places. If you need to reach something above you, use a strong step stool that has a grab bar. Keep electrical cords out of the way. Do not use floor polish or wax that makes floors slippery. If you must use wax, use non-skid floor wax. Do not have throw rugs and other things on the floor that can make you trip. What can I do with my stairs? Do not leave any items on the stairs. Make sure that there are handrails on both sides of the stairs and use them. Fix handrails that are broken or loose. Make sure that handrails are as long as the stairways. Check any carpeting to make sure that it is firmly attached to the stairs. Fix any carpet that is loose or worn. Avoid having throw rugs at the top or bottom of the stairs. If you  do have throw rugs, attach them to the floor with carpet tape. Make sure that you have a light switch at the top of the stairs and the bottom of the stairs. If you do not have them, ask someone to add them for you. What else can I do to help prevent falls? Wear shoes that: Do not have high heels. Have rubber bottoms. Are comfortable and fit you well. Are closed at the toe. Do not wear sandals. If you use a stepladder: Make sure that it is fully opened. Do not climb a closed stepladder. Make sure that both sides of the stepladder are locked into place. Ask someone to hold it for you, if possible. Clearly mark and make sure that you can see: Any grab bars or handrails. First and last  steps. Where the edge of each step is. Use tools that help you move around (mobility aids) if they are needed. These include: Canes. Walkers. Scooters. Crutches. Turn on the lights when you go into a dark area. Replace any light bulbs as soon as they burn out. Set up your furniture so you have a clear path. Avoid moving your furniture around. If any of your floors are uneven, fix them. If there are any pets around you, be aware of where they are. Review your medicines with your doctor. Some medicines can make you feel dizzy. This can increase your chance of falling. Ask your doctor what other things that you can do to help prevent falls. This information is not intended to replace advice given to you by your health care provider. Make sure you discuss any questions you have with your health care provider. Document Released: 06/07/2009 Document Revised: 01/17/2016 Document Reviewed: 09/15/2014 Elsevier Interactive Patient Education  2017 [employer].

## 2021-06-25 ENCOUNTER — Ambulatory Visit (INDEPENDENT_AMBULATORY_CARE_PROVIDER_SITE_OTHER): Payer: Medicare Other | Admitting: Family Medicine

## 2021-06-25 ENCOUNTER — Encounter: Payer: Self-pay | Admitting: Family Medicine

## 2021-06-25 ENCOUNTER — Other Ambulatory Visit: Payer: Self-pay

## 2021-06-25 VITALS — BP 100/70 | HR 68 | Temp 99.0°F | Resp 16 | Ht 62.5 in | Wt 88.0 lb

## 2021-06-25 DIAGNOSIS — E039 Hypothyroidism, unspecified: Secondary | ICD-10-CM | POA: Diagnosis not present

## 2021-06-25 DIAGNOSIS — F419 Anxiety disorder, unspecified: Secondary | ICD-10-CM

## 2021-06-25 DIAGNOSIS — M159 Polyosteoarthritis, unspecified: Secondary | ICD-10-CM | POA: Diagnosis not present

## 2021-06-25 DIAGNOSIS — G2581 Restless legs syndrome: Secondary | ICD-10-CM | POA: Diagnosis not present

## 2021-06-25 DIAGNOSIS — B3781 Candidal esophagitis: Secondary | ICD-10-CM

## 2021-06-25 DIAGNOSIS — E559 Vitamin D deficiency, unspecified: Secondary | ICD-10-CM

## 2021-06-25 DIAGNOSIS — E876 Hypokalemia: Secondary | ICD-10-CM

## 2021-06-25 DIAGNOSIS — F411 Generalized anxiety disorder: Secondary | ICD-10-CM

## 2021-06-25 DIAGNOSIS — N289 Disorder of kidney and ureter, unspecified: Secondary | ICD-10-CM

## 2021-06-25 DIAGNOSIS — R110 Nausea: Secondary | ICD-10-CM | POA: Diagnosis not present

## 2021-06-25 DIAGNOSIS — Z0000 Encounter for general adult medical examination without abnormal findings: Secondary | ICD-10-CM | POA: Diagnosis not present

## 2021-06-25 DIAGNOSIS — E785 Hyperlipidemia, unspecified: Secondary | ICD-10-CM | POA: Diagnosis not present

## 2021-06-25 LAB — LIPID PANEL
Cholesterol: 176 mg/dL (ref 0–200)
HDL: 67.3 mg/dL (ref >39.00)
LDL Cholesterol: 94 mg/dL (ref 0–99)
NonHDL: 108.35
Total CHOL/HDL Ratio: 3
Triglycerides: 73 mg/dL (ref 0.0–149.0)
VLDL: 14.6 mg/dL (ref 0.0–40.0)

## 2021-06-25 LAB — COMPREHENSIVE METABOLIC PANEL
ALT: 24 U/L (ref 0–35)
AST: 32 U/L (ref 0–37)
Albumin: 3.9 g/dL (ref 3.5–5.2)
Alkaline Phosphatase: 58 U/L (ref 39–117)
BUN: 16 mg/dL (ref 6–23)
CO2: 29 mEq/L (ref 19–32)
Calcium: 9.3 mg/dL (ref 8.4–10.5)
Chloride: 100 mEq/L (ref 96–112)
Creatinine, Ser: 1.24 mg/dL — ABNORMAL HIGH (ref 0.40–1.20)
GFR: 38.13 mL/min — ABNORMAL LOW (ref >60.00)
Glucose, Bld: 90 mg/dL (ref 70–99)
Potassium: 3.7 mEq/L (ref 3.5–5.1)
Sodium: 139 mEq/L (ref 135–145)
Total Bilirubin: 0.5 mg/dL (ref 0.2–1.2)
Total Protein: 6 g/dL (ref 6.0–8.3)

## 2021-06-25 LAB — CBC WITH DIFFERENTIAL/PLATELET
Basophils Absolute: 0 10*3/uL (ref 0.0–0.1)
Basophils Relative: 0.6 % (ref 0.0–3.0)
Eosinophils Absolute: 0 10*3/uL (ref 0.0–0.7)
Eosinophils Relative: 0.8 % (ref 0.0–5.0)
HCT: 36.9 % (ref 36.0–46.0)
Hemoglobin: 12 g/dL (ref 12.0–15.0)
Lymphocytes Relative: 28.1 % (ref 12.0–46.0)
Lymphs Abs: 1.7 10*3/uL (ref 0.7–4.0)
MCHC: 32.6 g/dL (ref 30.0–36.0)
MCV: 94.5 fl (ref 78.0–100.0)
Monocytes Absolute: 0.5 10*3/uL (ref 0.1–1.0)
Monocytes Relative: 8.3 % (ref 3.0–12.0)
Neutro Abs: 3.8 10*3/uL (ref 1.4–7.7)
Neutrophils Relative %: 62.2 % (ref 43.0–77.0)
Platelets: 281 10*3/uL (ref 150.0–400.0)
RBC: 3.91 Mil/uL (ref 3.87–5.11)
RDW: 13.3 % (ref 11.5–15.5)
WBC: 6.1 10*3/uL (ref 4.0–10.5)

## 2021-06-25 LAB — TSH: TSH: 1.56 u[IU]/mL (ref 0.35–5.50)

## 2021-06-25 LAB — VITAMIN D 25 HYDROXY (VIT D DEFICIENCY, FRACTURES): VITD: 65.99 ng/mL (ref 30.00–100.00)

## 2021-06-25 MED ORDER — SIMVASTATIN 20 MG PO TABS: 20.0000 mg | ORAL_TABLET | Freq: Every day | ORAL | 3 refills | Status: DC

## 2021-06-25 MED ORDER — POTASSIUM CHLORIDE CRYS ER 20 MEQ PO TBCR: 20.0000 meq | EXTENDED_RELEASE_TABLET | Freq: Two times a day (BID) | ORAL | 3 refills | Status: DC

## 2021-06-25 MED ORDER — LEVOTHYROXINE SODIUM 50 MCG PO TABS: 50.0000 ug | ORAL_TABLET | Freq: Every day | ORAL | 3 refills | Status: DC

## 2021-06-25 MED ORDER — PROMETHAZINE HCL 25 MG PO TABS: 25.0000 mg | ORAL_TABLET | Freq: Two times a day (BID) | ORAL | 1 refills | Status: DC

## 2021-06-25 MED ORDER — ROPINIROLE HCL 1 MG PO TABS: 1.0000 mg | ORAL_TABLET | Freq: Every day | ORAL | 3 refills | Status: DC

## 2021-06-25 MED ORDER — FUROSEMIDE 40 MG PO TABS: 40.0000 mg | ORAL_TABLET | Freq: Every day | ORAL | 3 refills | Status: DC | PRN

## 2021-06-25 MED ORDER — TRAMADOL HCL 50 MG PO TABS: 50.0000 mg | ORAL_TABLET | Freq: Four times a day (QID) | ORAL | 1 refills | Status: DC | PRN

## 2021-06-25 MED ORDER — ALPRAZOLAM 0.5 MG PO TABS: 0.5000 mg | ORAL_TABLET | Freq: Two times a day (BID) | ORAL | 0 refills | Status: DC | PRN

## 2021-06-25 MED ORDER — FLUCONAZOLE 100 MG PO TABS: ORAL_TABLET | ORAL | 1 refills | Status: DC

## 2021-06-25 NOTE — Patient Instructions (Signed)
Preventive Care 85 Years and Older, Female Preventive care refers to lifestyle choices and visits with your health care provider that can promote health and wellness. This includes: A yearly physical exam. This is also called an annual wellness visit. Regular dental and eye exams. Immunizations. Screening for certain conditions. Healthy lifestyle choices, such as: Eating a healthy diet. Getting regular exercise. Not using drugs or products that contain nicotine and tobacco. Limiting alcohol use. What can I expect for my preventive care visit? Physical exam Your health care provider will check your: Height and weight. These may be used to calculate your BMI (body mass index). BMI is a measurement that tells if you are at a healthy weight. Heart rate and blood pressure. Body temperature. Skin for abnormal spots. Counseling Your health care provider may ask you questions about your: Past medical problems. Family's medical history. Alcohol, tobacco, and drug use. Emotional well-being. Home life and relationship well-being. Sexual activity. Diet, exercise, and sleep habits. History of falls. Memory and ability to understand (cognition). Work and work Statistician. Pregnancy and menstrual history. Access to firearms. What immunizations do I need? Vaccines are usually given at various ages, according to a schedule. Your health care provider will recommend vaccines for you based on your age, medical history, and lifestyle or other factors, such as travel or where you work. What tests do I need? Blood tests Lipid and cholesterol levels. These may be checked every 5 years, or more often depending on your overall health. Hepatitis C test. Hepatitis B test. Screening Lung cancer screening. You may have this screening every year starting at age 85 if you have a 30-pack-year history of smoking and currently smoke or have quit within the past 15 years. Colorectal cancer screening. All  adults should have this screening starting at age 85 and continuing until age 85. Your health care provider may recommend screening at age 85 if you are at increased risk. You will have tests every 1-10 years, depending on your results and the type of screening test. Diabetes screening. This is done by checking your blood sugar (glucose) after you have not eaten for a while (fasting). You may have this done every 1-3 years. Mammogram. This may be done every 1-2 years. Talk with your health care provider about how often you should have regular mammograms. Abdominal aortic aneurysm (AAA) screening. You may need this if you are a current or former smoker. BRCA-related cancer screening. This may be done if you have a family history of breast, ovarian, tubal, or peritoneal cancers. Other tests STD (sexually transmitted disease) testing, if you are at risk. Bone density scan. This is done to screen for osteoporosis. You may have this done starting at age 85. Talk with your health care provider about your test results, treatment options, and if necessary, the need for more tests. Follow these instructions at home: Eating and drinking  Eat a diet that includes fresh fruits and vegetables, whole grains, lean protein, and low-fat dairy products. Limit your intake of foods with high amounts of sugar, saturated fats, and salt. Take vitamin and mineral supplements as recommended by your health care provider. Do not drink alcohol if your health care provider tells you not to drink. If you drink alcohol: Limit how much you have to 0-1 drink a day. Be aware of how much alcohol is in your drink. In the [place] one drink equals one 12 oz bottle of beer (355 mL), one 5 oz glass of wine (148 mL), or one  1 oz glass of hard liquor (44 mL). Lifestyle Take daily care of your teeth and gums. Brush your teeth every morning and night with fluoride toothpaste. Floss one time each day. Stay active. Exercise for at least  30 minutes 5 or more days each week. Do not use any products that contain nicotine or tobacco, such as cigarettes, e-cigarettes, and chewing tobacco. If you need help quitting, ask your health care provider. Do not use drugs. If you are sexually active, practice safe sex. Use a condom or other form of protection in order to prevent STIs (sexually transmitted infections). Talk with your health care provider about taking a low-dose aspirin or statin. Find healthy ways to cope with stress, such as: Meditation, yoga, or listening to music. Journaling. Talking to a trusted person. Spending time with friends and family. Safety Always wear your seat belt while driving or riding in a vehicle. Do not drive: If you have been drinking alcohol. Do not ride with someone who has been drinking. When you are tired or distracted. While texting. Wear a helmet and other protective equipment during sports activities. If you have firearms in your house, make sure you follow all gun safety procedures. What's next? Visit your health care provider once a year for an annual wellness visit. Ask your health care provider how often you should have your eyes and teeth checked. Stay up to date on all vaccines. This information is not intended to replace advice given to you by your health care provider. Make sure you discuss any questions you have with your health care provider. Document Revised: 10/19/2020 Document Reviewed: 08/05/2018 Elsevier Patient Education  2022 [employer].

## 2021-06-25 NOTE — Assessment & Plan Note (Signed)
Stable con't med Check labs

## 2021-06-25 NOTE — Assessment & Plan Note (Signed)
Stable. Refill meds

## 2021-06-25 NOTE — Assessment & Plan Note (Signed)
Encourage heart healthy diet such as MIND or DASH diet, increase exercise, avoid trans fats, simple carbohydrates and processed foods, consider a krill or fish or flaxseed oil cap daily.  °

## 2021-06-25 NOTE — Progress Notes (Addendum)
Subjective:   By signing my name below, I, Shehryar Baig, attest that this documentation has been prepared under the direction and in the presence of Dr. Roma Schanz, DO. 06/25/2021    Patient ID: [name], female    DOB: [birth year], 87 y.o.   MRN: [id number]  Chief Complaint  Patient presents with   Annual Exam    HPI Patient is in today for a comprehensive physical exam. Her daughter is present with her during this visit.   Medication refills- She is requesting a refill on 50 mg tramadol PRN, 20 mg simvastatin daily PO, 0.5 mg alprazolam PRN, 25 mg promethazine 2x daily, potassium, 1 mg Requip daily PO, 40 mg furosemide daily PO, 100 mg fluconazole PRN, 50 mg levothyroxine daily PO.  Home life- She has no new issues while living at home and is managing herself well at this time. Her daughter assists her.  Hemoglobin- Her daughter is requesting her hemoglobin be checked during her next lab work. Her other provider requested her to check it during her next PE.    CPE She denies having any fever, new moles, congestion, sore throat, muscle pain, joint pain, chest pain, cough, SOB, wheezing, n/v/d, constipation, blood in stool, dysuria, frequency, hematuria, or headaches at this time. Immunizations: She is UTD on flu vaccines. She has 5 Covid-19 vaccines including the bivalent Covid-19 vaccines. She is eligible for the shingles vaccine and is not interested in receiving it at this time.  Diet: She is managing a healthy diet at this time. She is eating 3 meals a day. She does not drink ensure or boost. Her daughter reports she eats pedialyte when she is feeling dehydrated. Her weight has not changed since her last visit.   Wt Readings from Last 3 Encounters:  06/25/21 88 lb (39.9 kg)  03/05/21 88 lb (39.9 kg)  02/26/21 88 lb 3.2 oz (40 kg)   Vision: She is UTD on vision care. Dental: She is UTD on dental care.   Past Medical History:  Diagnosis Date   Allergy     Anemia    Anxiety    Cataract 05/2011   Chronic low blood pressure    due to dehydration   Depression    Esophageal disorder    Esophageal yeast infection (HCC)    GERD (gastroesophageal reflux disease)    Hearing loss    hearing aids   Hyperlipidemia    Hypothyroidism    Myocardial infarction [facility])    Osteoporosis    Renal cell carcinoma ([place])    found on CT   Thyroid disease    hypothyroidism   Transfusion history    last 2004    Past Surgical History:  Procedure Laterality Date   APPENDECTOMY     BLEPHAROPLASTY Right    x 3 (all related to basal cell carcinoma)   COLONOSCOPY     CORONARY ARTERY BYPASS GRAFT     x2 vessel bypass -Dr. Burt Knack -[place] [facility] 06-22-14   EUS N/A 02/04/2018   Procedure: UPPER ENDOSCOPIC ULTRASOUND (EUS) RADIAL;  Surgeon: Milus Banister, MD;  Location: [facility] ENDOSCOPY;  Service: Endoscopy;  Laterality: N/A;   FINE NEEDLE ASPIRATION N/A 02/04/2018   Procedure: FINE NEEDLE ASPIRATION (FNA) LINEAR;  Surgeon: Milus Banister, MD;  Location: [facility] ENDOSCOPY;  Service: Endoscopy;  Laterality: N/A;   IR GENERIC HISTORICAL  08/29/2014   IR RADIOLOGIST EVAL & MGMT 08/29/2014 Aletta Edouard, MD [facility] INTERV RAD   IR GENERIC HISTORICAL  10/22/2016  IR RADIOLOGIST EVAL & MGMT 10/22/2016 Aletta Edouard, MD [facility] INTERV RAD   IR RADIOLOGIST EVAL & MGMT  11/03/2017   IR RADIOLOGIST EVAL & MGMT  02/15/2019   KYPHOSIS SURGERY     obstructed ureter Left    repair-operative   RENAL CRYOABLATION Right 09/2014   TONSILLECTOMY     TOTAL ABDOMINAL HYSTERECTOMY W/ BILATERAL SALPINGOOPHORECTOMY      Family History  Problem Relation Age of Onset   Parkinsonism Father    Hypertension Other    Heart disease Other        CAD.Marland Kitchen1ST DEGREE RELATIVE <60   Hyperlipidemia Other    Breast cancer Sister    Breast cancer Other    Colon cancer Neg Hx    Esophageal cancer Neg Hx    Rectal cancer Neg Hx    Stomach cancer Neg Hx     Social History   Socioeconomic History    Marital status: Widowed    Spouse name: Not on file   Number of children: 2   Years of education: Not on file   Highest education level: Not on file  Occupational History   Occupation: Retired    Fish farm manager: RETIRED  Tobacco Use   Smoking status: Former   Smokeless tobacco: Never  Substance and Sexual Activity   Alcohol use: No   Drug use: No   Sexual activity: Not Currently  Other Topics Concern   Not on file  Social History Narrative   REG EXERCISE   WIDOW   LIVES ALONE   END OF [place] HEROIC OR FUTILE MEASURES.   Social Determinants of Health   Financial Resource Strain: Low Risk    Difficulty of Paying Living Expenses: Not hard at all  Food Insecurity: No Food Insecurity   Worried About Charity fundraiser in the Last Year: Never true   [place] in the Last Year: Never true  Transportation Needs: No Transportation Needs   Lack of Transportation (Medical): No   Lack of Transportation (Non-Medical): No  Physical Activity: Sufficiently Active   Days of Exercise per Week: 5 days   Minutes of Exercise per Session: 50 min  Stress: No Stress Concern Present   Feeling of Stress : Not at all  Social Connections: Moderately Isolated   Frequency of Communication with Friends and Family: More than three times a week   Frequency of Social Gatherings with Friends and Family: More than three times a week   Attends Religious Services: More than 4 times per year   Active Member of [employer] or Organizations: No   Attends Archivist Meetings: Never   Marital Status: Widowed  Intimate Partner Violence: Not on file    Outpatient Medications Prior to Visit  Medication Sig Dispense Refill   aspirin 81 MG EC tablet Take 81 mg by mouth daily.     Cholecalciferol (VITAMIN D3) 1000 UNITS CAPS Take 1,000 Units by mouth daily.     ferrous fumarate (HEMOCYTE - 106 MG  FE) 325 (106 Fe) MG TABS tablet Take 325 mg of iron by mouth daily.     fluticasone (FLONASE) 50 MCG/ACT nasal spray Place 2 sprays into both nostrils daily as needed for allergies or rhinitis.     folic acid (FOLVITE) 1 MG tablet Take 1 mg by mouth daily.     hydroxypropyl methylcellulose / hypromellose (ISOPTO TEARS / GONIOVISC) 2.5 % ophthalmic  solution Place 1 drop into both eyes daily as needed for dry eyes.     ibuprofen (ADVIL) 200 MG tablet Take 400 mg by mouth every 6 (six) hours as needed for mild pain.     levofloxacin (LEVAQUIN) 500 MG tablet Take 1 tablet (500 mg total) by mouth daily. 7 tablet 0   Multiple Vitamin (MULTIVITAMIN WITH MINERALS) TABS tablet Take 1 tablet by mouth daily.     oxymetazoline (AFRIN) 0.05 % nasal spray Place 1 spray into both nostrils daily as needed for congestion.     tiZANidine (ZANAFLEX) 2 MG tablet Take 2 mg by mouth daily as needed for muscle spasms.     ALPRAZolam (XANAX) 0.5 MG tablet Take 1 tablet (0.5 mg total) by mouth 2 (two) times daily as needed for anxiety. 180 tablet 0   fluconazole (DIFLUCAN) 100 MG tablet TAKE 1 TABLET BY MOUTH  DAILY AS NEEDED FOR YEAST  INFECTION 90 tablet 1   furosemide (LASIX) 40 MG tablet Take 1 tablet (40 mg total) by mouth daily as needed. 90 tablet 3   levothyroxine (SYNTHROID) 50 MCG tablet Take 1 tablet (50 mcg total) by mouth daily. 90 tablet 3   potassium chloride SA (KLOR-CON) 20 MEQ tablet Take 1 tablet (20 mEq total) by mouth 2 (two) times daily. 180 tablet 3   promethazine (PHENERGAN) 25 MG tablet Take 1 tablet (25 mg total) by mouth 2 (two) times daily. 180 tablet 1   rOPINIRole (REQUIP) 1 MG tablet Take 1 tablet (1 mg total) by mouth at bedtime. 90 tablet 3   simvastatin (ZOCOR) 20 MG tablet Take 1 tablet (20 mg total) by mouth daily. 90 tablet 3   traMADol (ULTRAM) 50 MG tablet Take 1 tablet (50 mg total) by mouth every 6 (six) hours as needed. for pain 120 tablet 1   No facility-administered medications  prior to visit.    Allergies  Allergen Reactions   Zofran [Ondansetron Hcl] Anaphylaxis and Other (See Comments)    Tongue swelling, lost of voice   Clarithromycin     Pt does not remember the reaction   Codeine     Per the pt, "It made me disoriented"   Diazepam Other (See Comments)    Per the pt, "I don't remember the reaction"   Sulfonamide Derivatives     Per the pt, "It made me disoriented"   Prilosec [Omeprazole Magnesium] Rash   Trazodone Anxiety    Review of Systems  Constitutional:  Negative for fever and malaise/fatigue.  HENT:  Negative for congestion and sore throat.   Eyes:  Negative for blurred vision.  Respiratory:  Negative for cough, shortness of breath and wheezing.   Cardiovascular:  Negative for chest pain, palpitations and leg swelling.  Gastrointestinal:  Negative for abdominal pain, blood in stool, constipation, diarrhea, nausea and vomiting.  Genitourinary:  Negative for dysuria, frequency and hematuria.  Musculoskeletal:  Negative for falls, joint pain and myalgias.  Skin:  Negative for rash.       (-)New moles  Neurological:  Negative for dizziness, loss of consciousness and headaches.  Endo/Heme/Allergies:  Negative for environmental allergies.  Psychiatric/Behavioral:  Negative for depression. The patient is not nervous/anxious.       Objective:    Physical Exam Vitals and nursing note reviewed.  Constitutional:      General: She is not in acute distress.    Appearance: Normal appearance. She is not ill-appearing.  HENT:     Head: Normocephalic and atraumatic.  Right Ear: Tympanic membrane, ear canal and external ear normal.     Left Ear: Tympanic membrane, ear canal and external ear normal.  Eyes:     Extraocular Movements: Extraocular movements intact.     Pupils: Pupils are equal, round, and reactive to light.  Cardiovascular:     Rate and Rhythm: Normal rate and regular rhythm.     Heart sounds: Normal heart sounds. No murmur  heard.   No gallop.  Pulmonary:     Effort: Pulmonary effort is normal. No respiratory distress.     Breath sounds: Normal breath sounds. No wheezing or rales.  Abdominal:     General: Bowel sounds are normal.     Palpations: Abdomen is soft.     Tenderness: There is no abdominal tenderness.  Skin:    General: Skin is warm and dry.  Neurological:     General: No focal deficit present.     Mental Status: She is alert and oriented to person, place, and time. Mental status is at baseline.  Psychiatric:        Mood and Affect: Mood normal.        Behavior: Behavior normal.        Thought Content: Thought content normal.        Judgment: Judgment normal.    BP 100/70 (BP Location: Left Arm, Patient Position: Sitting, Cuff Size: Small)   Pulse 68   Temp 99 F (37.2 C) (Oral)   Resp 16   Ht 5' 2.5" (1.588 m)   Wt 88 lb (39.9 kg)   BMI 15.84 kg/m  Wt Readings from Last 3 Encounters:  06/25/21 88 lb (39.9 kg)  03/05/21 88 lb (39.9 kg)  02/26/21 88 lb 3.2 oz (40 kg)    Diabetic Foot Exam - Simple   No data filed    Lab Results  Component Value Date   WBC 5.6 02/26/2021   HGB 12.4 02/26/2021   HCT 36.5 02/26/2021   PLT 330.0 02/26/2021   GLUCOSE 86 02/26/2021   CHOL 171 02/26/2021   TRIG 60.0 02/26/2021   HDL 73.20 02/26/2021   LDLDIRECT 165.1 05/10/2008   LDLCALC 86 02/26/2021   ALT 21 02/26/2021   AST 29 02/26/2021   NA 138 02/26/2021   K 4.0 02/26/2021   CL 99 02/26/2021   CREATININE 1.17 02/26/2021   BUN 14 02/26/2021   CO2 29 02/26/2021   TSH 1.08 02/26/2021   INR 0.89 09/29/2014   HGBA1C 5.8 10/27/2013    Lab Results  Component Value Date   TSH 1.08 02/26/2021   Lab Results  Component Value Date   WBC 5.6 02/26/2021   HGB 12.4 02/26/2021   HCT 36.5 02/26/2021   MCV 92.0 02/26/2021   PLT 330.0 02/26/2021   Lab Results  Component Value Date   NA 138 02/26/2021   K 4.0 02/26/2021   CO2 29 02/26/2021   GLUCOSE 86 02/26/2021   BUN 14 02/26/2021    CREATININE 1.17 02/26/2021   BILITOT 0.6 02/26/2021   ALKPHOS 62 02/26/2021   AST 29 02/26/2021   ALT 21 02/26/2021   PROT 6.3 02/26/2021   ALBUMIN 4.0 02/26/2021   CALCIUM 9.8 02/26/2021   ANIONGAP 9 11/04/2020   GFR 40.98 (L) 02/26/2021   Lab Results  Component Value Date   CHOL 171 02/26/2021   Lab Results  Component Value Date   HDL 73.20 02/26/2021   Lab Results  Component Value Date   LDLCALC 86 02/26/2021  Lab Results  Component Value Date   TRIG 60.0 02/26/2021   Lab Results  Component Value Date   CHOLHDL 2 02/26/2021   Lab Results  Component Value Date   HGBA1C 5.8 10/27/2013       Assessment & Plan:   Problem List Items Addressed This Visit       Unprioritized   Osteoarthritis of multiple joints   Relevant Medications   traMADol (ULTRAM) 50 MG tablet   RLS (restless legs syndrome)   Relevant Medications   rOPINIRole (REQUIP) 1 MG tablet   Other Relevant Orders   CBC with Differential/Platelet   Anxiety state    Stable Refill meds      Relevant Medications   ALPRAZolam (XANAX) 0.5 MG tablet   Hyperlipidemia    Encourage heart healthy diet such as MIND or DASH diet, increase exercise, avoid trans fats, simple carbohydrates and processed foods, consider a krill or fish or flaxseed oil cap daily.       Relevant Medications   simvastatin (ZOCOR) 20 MG tablet   furosemide (LASIX) 40 MG tablet   Other Relevant Orders   CBC with Differential/Platelet   Lipid panel   Hypothyroidism    Stable con't med Check labs      Relevant Medications   levothyroxine (SYNTHROID) 50 MCG tablet   Other Relevant Orders   TSH   CBC with Differential/Platelet   Other Visit Diagnoses     Vitamin D deficiency    -  Primary   Relevant Orders   VITAMIN D 25 Hydroxy (Vit-D Deficiency, Fractures)   Nausea       Relevant Medications   promethazine (PHENERGAN) 25 MG tablet   Hypokalemia       Relevant Medications   potassium chloride SA (KLOR-CON)  20 MEQ tablet   Other Relevant Orders   CBC with Differential/Platelet   Comprehensive metabolic panel   Kidney function abnormal       Relevant Medications   furosemide (LASIX) 40 MG tablet   Esophageal candidiasis (HCC)       Relevant Medications   fluconazole (DIFLUCAN) 100 MG tablet   Anxiety       Relevant Medications   ALPRAZolam (XANAX) 0.5 MG tablet        Meds ordered this encounter  Medications   traMADol (ULTRAM) 50 MG tablet    Sig: Take 1 tablet (50 mg total) by mouth every 6 (six) hours as needed. for pain    Dispense:  120 tablet    Refill:  1   simvastatin (ZOCOR) 20 MG tablet    Sig: Take 1 tablet (20 mg total) by mouth daily.    Dispense:  90 tablet    Refill:  3    Requesting 1 year supply   rOPINIRole (REQUIP) 1 MG tablet    Sig: Take 1 tablet (1 mg total) by mouth at bedtime.    Dispense:  90 tablet    Refill:  3    Requesting 1 year supply   promethazine (PHENERGAN) 25 MG tablet    Sig: Take 1 tablet (25 mg total) by mouth 2 (two) times daily.    Dispense:  180 tablet    Refill:  1   potassium chloride SA (KLOR-CON) 20 MEQ tablet    Sig: Take 1 tablet (20 mEq total) by mouth 2 (two) times daily.    Dispense:  180 tablet    Refill:  3    Requesting 1 year supply   levothyroxine (  SYNTHROID) 50 MCG tablet    Sig: Take 1 tablet (50 mcg total) by mouth daily.    Dispense:  90 tablet    Refill:  3    Requesting 1 year supply   furosemide (LASIX) 40 MG tablet    Sig: Take 1 tablet (40 mg total) by mouth daily as needed.    Dispense:  90 tablet    Refill:  3    Requesting 1 year supply   fluconazole (DIFLUCAN) 100 MG tablet    Sig: TAKE 1 TABLET BY MOUTH  DAILY AS NEEDED FOR YEAST  INFECTION    Dispense:  90 tablet    Refill:  1   ALPRAZolam (XANAX) 0.5 MG tablet    Sig: Take 1 tablet (0.5 mg total) by mouth 2 (two) times daily as needed for anxiety.    Dispense:  180 tablet    Refill:  0    I, Dr. Roma Schanz, DO, personally  preformed the services described in this documentation.  All medical record entries made by the scribe were at my direction and in my presence.  I have reviewed the chart and discharge instructions (if applicable) and agree that the record reflects my personal performance and is accurate and complete. 06/25/2021   I,Shehryar Baig,acting as a scribe for Ann Held, DO.,have documented all relevant documentation on the behalf of Ann Held, DO,as directed by  Ann Held, DO while in the presence of Ann Held, DO.   Ann Held, DO

## 2021-06-27 ENCOUNTER — Encounter: Payer: Medicare Other | Admitting: Family Medicine

## 2021-06-30 ENCOUNTER — Encounter: Payer: Self-pay | Admitting: Family Medicine

## 2021-07-01 ENCOUNTER — Telehealth: Payer: Self-pay

## 2021-07-01 NOTE — Telephone Encounter (Signed)
PA Denied per. Insurance.

## 2021-07-01 NOTE — Telephone Encounter (Signed)
Promethazine was denied through insurance. Please advise

## 2021-07-01 NOTE — Telephone Encounter (Signed)
PA sent.  Key: [id number]

## 2021-07-02 NOTE — Telephone Encounter (Signed)
No, other options given.

## 2021-07-03 ENCOUNTER — Telehealth: Payer: Self-pay | Admitting: *Deleted

## 2021-07-03 NOTE — Telephone Encounter (Signed)
Prior [name] was cancelled. Will await on denial letter from the first one.

## 2021-07-03 NOTE — Telephone Encounter (Signed)
Prior auth started via cover my meds.  Awaiting determination.  Key: [id number]

## 2021-07-03 NOTE — Telephone Encounter (Signed)
Called and spoke with appeal department and they stated that no prior auth was submitted and we could redo another prior auth.  I tried another prior [place] and it was cancelled.  Will await for denial letter to do urgent appeal.

## 2021-07-03 NOTE — Telephone Encounter (Signed)
This question was not asked on the prior auth and had no place to add this information

## 2021-07-04 ENCOUNTER — Encounter: Payer: Self-pay | Admitting: *Deleted

## 2021-07-04 ENCOUNTER — Telehealth: Payer: Self-pay | Admitting: Family Medicine

## 2021-07-04 NOTE — Telephone Encounter (Signed)
Noted  

## 2021-07-04 NOTE — Telephone Encounter (Addendum)
Noted.       Note    [name] routed conversation to Sanda Linger, CMA 2 hours ago (11:47 AM)   [name] B 2 hours ago (11:47 AM)   Clint Lipps from [employer] called to state they received expedited appeal and it will take 72 hours to process  promethazine (PHENERGAN) 25 MG tablet   she left their fax incase there is anything else that needs to be done.    Fax: [phone]

## 2021-07-04 NOTE — Telephone Encounter (Signed)
Anasia from [employer] called to state they received expedited appeal and it will take 72 hours to process  promethazine (PHENERGAN) 25 MG tablet   she left their fax incase there is anything else that needs to be done.   Fax: [phone]

## 2021-07-04 NOTE — Telephone Encounter (Signed)
Urgent appeal letter faxed over to [facility].  Will await for determination.

## 2021-07-05 ENCOUNTER — Telehealth: Payer: Self-pay | Admitting: Family Medicine

## 2021-07-05 NOTE — Telephone Encounter (Signed)
[place] [employer] have approved the promethazine (PHENERGAN) 25 MG tablet and will be mailing the approval to [place] and the patient. If any new questions develop please contact tel: [phone].

## 2021-07-05 NOTE — Telephone Encounter (Signed)
Noted  

## 2021-07-10 NOTE — Telephone Encounter (Signed)
Medication was approved from 07/01/21-07/05/22

## 2021-07-10 NOTE — Telephone Encounter (Signed)
Left message on daughters voicemail about approval.

## 2021-09-16 ENCOUNTER — Other Ambulatory Visit: Payer: Self-pay | Admitting: Family Medicine

## 2021-09-16 DIAGNOSIS — F419 Anxiety disorder, unspecified: Secondary | ICD-10-CM

## 2021-09-16 NOTE — Telephone Encounter (Signed)
Requesting: alprazolam 0.5mg  Contract: 11/05/2017 UDS: 11/05/2017 Last Visit: 06/25/2021 Next Visit: 12/24/2021 Last Refill: 06/25/2021 #180 and 0RF  Please Advise

## 2021-10-23 DIAGNOSIS — C441122 Basal cell carcinoma of skin of right lower eyelid, including canthus: Secondary | ICD-10-CM | POA: Diagnosis not present

## 2021-10-23 DIAGNOSIS — H40013 Open angle with borderline findings, low risk, bilateral: Secondary | ICD-10-CM | POA: Diagnosis not present

## 2021-10-23 DIAGNOSIS — H353131 Nonexudative age-related macular degeneration, bilateral, early dry stage: Secondary | ICD-10-CM | POA: Diagnosis not present

## 2021-10-23 DIAGNOSIS — H04123 Dry eye syndrome of bilateral lacrimal glands: Secondary | ICD-10-CM | POA: Diagnosis not present

## 2021-11-20 DIAGNOSIS — H526 Other disorders of refraction: Secondary | ICD-10-CM | POA: Diagnosis not present

## 2021-12-24 ENCOUNTER — Encounter: Payer: Self-pay | Admitting: Family Medicine

## 2021-12-24 ENCOUNTER — Ambulatory Visit (INDEPENDENT_AMBULATORY_CARE_PROVIDER_SITE_OTHER): Payer: Medicare Other | Admitting: Family Medicine

## 2021-12-24 VITALS — BP 120/60 | HR 74 | Temp 97.8°F | Resp 18 | Ht 62.5 in | Wt 80.0 lb

## 2021-12-24 DIAGNOSIS — B3781 Candidal esophagitis: Secondary | ICD-10-CM | POA: Diagnosis not present

## 2021-12-24 DIAGNOSIS — M159 Polyosteoarthritis, unspecified: Secondary | ICD-10-CM

## 2021-12-24 DIAGNOSIS — E785 Hyperlipidemia, unspecified: Secondary | ICD-10-CM

## 2021-12-24 DIAGNOSIS — I739 Peripheral vascular disease, unspecified: Secondary | ICD-10-CM

## 2021-12-24 DIAGNOSIS — E039 Hypothyroidism, unspecified: Secondary | ICD-10-CM | POA: Diagnosis not present

## 2021-12-24 DIAGNOSIS — Z79899 Other long term (current) drug therapy: Secondary | ICD-10-CM

## 2021-12-24 DIAGNOSIS — R110 Nausea: Secondary | ICD-10-CM

## 2021-12-24 DIAGNOSIS — J449 Chronic obstructive pulmonary disease, unspecified: Secondary | ICD-10-CM

## 2021-12-24 DIAGNOSIS — R531 Weakness: Secondary | ICD-10-CM

## 2021-12-24 DIAGNOSIS — E43 Unspecified severe protein-calorie malnutrition: Secondary | ICD-10-CM

## 2021-12-24 LAB — COMPREHENSIVE METABOLIC PANEL
ALT: 23 U/L (ref 0–35)
AST: 29 U/L (ref 0–37)
Albumin: 4 g/dL (ref 3.5–5.2)
Alkaline Phosphatase: 69 U/L (ref 39–117)
BUN: 17 mg/dL (ref 6–23)
CO2: 25 mEq/L (ref 19–32)
Calcium: 9.4 mg/dL (ref 8.4–10.5)
Chloride: 104 mEq/L (ref 96–112)
Creatinine, Ser: 1.31 mg/dL — ABNORMAL HIGH (ref 0.40–1.20)
GFR: 35.58 mL/min — ABNORMAL LOW (ref >60.00)
Glucose, Bld: 89 mg/dL (ref 70–99)
Potassium: 4.3 mEq/L (ref 3.5–5.1)
Sodium: 138 mEq/L (ref 135–145)
Total Bilirubin: 0.4 mg/dL (ref 0.2–1.2)
Total Protein: 6.3 g/dL (ref 6.0–8.3)

## 2021-12-24 LAB — LIPID PANEL
Cholesterol: 166 mg/dL (ref 0–200)
HDL: 64.8 mg/dL (ref >39.00)
LDL Cholesterol: 83 mg/dL (ref 0–99)
NonHDL: 100.82
Total CHOL/HDL Ratio: 3
Triglycerides: 89 mg/dL (ref 0.0–149.0)
VLDL: 17.8 mg/dL (ref 0.0–40.0)

## 2021-12-24 LAB — TSH: TSH: 0.6 u[IU]/mL (ref 0.35–5.50)

## 2021-12-24 MED ORDER — TRAMADOL HCL 50 MG PO TABS: 50.0000 mg | ORAL_TABLET | Freq: Four times a day (QID) | ORAL | 1 refills | Status: DC | PRN

## 2021-12-24 MED ORDER — PROMETHAZINE HCL 25 MG PO TABS: 25.0000 mg | ORAL_TABLET | Freq: Two times a day (BID) | ORAL | 1 refills | Status: DC

## 2021-12-24 MED ORDER — FLUCONAZOLE 100 MG PO TABS: ORAL_TABLET | ORAL | 1 refills | Status: DC

## 2021-12-24 NOTE — Assessment & Plan Note (Signed)
Encourage heart healthy diet such as MIND or DASH diet, increase exercise, avoid trans fats, simple carbohydrates and processed foods, consider a krill or fish or flaxseed oil cap daily.  °

## 2021-12-24 NOTE — Patient Instructions (Signed)

## 2021-12-24 NOTE — Assessment & Plan Note (Signed)
Due to weight loss and age  ? ?

## 2021-12-24 NOTE — Assessment & Plan Note (Signed)
D/w daughter and pt ?Will monitor  ?

## 2021-12-24 NOTE — Assessment & Plan Note (Signed)
Check labs today ?con't synthroid  ?

## 2021-12-24 NOTE — Progress Notes (Signed)
? ?Subjective:  ? ?By signing my name below, I, Zite Okoli, attest that this documentation has been prepared under the direction and in the presence of Ann Held, DO. 12/24/2021  ? ? Patient ID: [name], female    DOB: [birth year], 88 y.o.   MRN: [id number] ? ?Chief Complaint  ?Patient presents with  ? Hyperlipidemia  ? Hypothyroidism  ? Follow-up  ? ? ?HPI ?Patient is in today for an office visit and 6 month f/u. She is accompanied by her daughter. ? ?She reports she has been feeling very weak recently and thinks it is due to the allergies caused by the change in seasons. She takes OTC medications to manage the symptoms. ? ?Her daughter reports she is losing weight fast and only eats small amounts. She does not like Ensure protein drinks. ?Wt Readings from Last 3 Encounters:  ?12/24/21 80 lb (36.3 kg)  ?06/25/21 88 lb (39.9 kg)  ?03/05/21 88 lb (39.9 kg)  ?She reports she is still able to do her chores without help and does not need any help.  ? ?Her blood pressure is stable at today's visit. ?BP Readings from Last 3 Encounters:  ?12/24/21 120/60  ?06/25/21 100/70  ?02/26/21 110/70  ?  ?She has 5 Covid-19 vaccines at this time.  ? ?She is requesting for a refill on 50 mg tramadol, 100 mg diflucan and 25 mg promethazine. ? ?Past Medical History:  ?Diagnosis Date  ? Allergy   ? Anemia   ? Anxiety   ? Cataract 05/2011  ? Chronic low blood pressure   ? due to dehydration  ? Depression   ? Esophageal disorder   ? Esophageal yeast infection ([place])   ? GERD (gastroesophageal reflux disease)   ? Hearing loss   ? hearing aids  ? Hyperlipidemia   ? Hypothyroidism   ? Myocardial infarction [facility])   ? Osteoporosis   ? Renal cell carcinoma ([place])   ? found on CT  ? Thyroid disease   ? hypothyroidism  ? Transfusion history   ? last 2004  ? ? ?Past Surgical History:  ?Procedure Laterality Date  ? APPENDECTOMY    ? BLEPHAROPLASTY Right   ? x 3 (all related to basal cell carcinoma)  ? COLONOSCOPY    ? CORONARY ARTERY  BYPASS GRAFT    ? x2 vessel bypass -Dr. Burt Knack -[facility] 06-22-14  ? EUS N/A 02/04/2018  ? Procedure: UPPER ENDOSCOPIC ULTRASOUND (EUS) RADIAL;  Surgeon: Milus Banister, MD;  Location: [facility];  Service: Endoscopy;  Laterality: N/A;  ? FINE NEEDLE ASPIRATION N/A 02/04/2018  ? Procedure: FINE NEEDLE ASPIRATION (FNA) LINEAR;  Surgeon: Milus Banister, MD;  [facility];  Service: Endoscopy;  Laterality: N/A;  ? IR GENERIC HISTORICAL  08/29/2014  ? IR RADIOLOGIST EVAL & MGMT 08/29/2014 Aletta Edouard, MD [facility] INTERV RAD  ? IR GENERIC HISTORICAL  10/22/2016  ? IR RADIOLOGIST EVAL & MGMT 10/22/2016 Aletta Edouard, MD [facility] INTERV RAD  ? IR RADIOLOGIST EVAL & MGMT  11/03/2017  ? IR RADIOLOGIST EVAL & MGMT  02/15/2019  ? KYPHOSIS SURGERY    ? obstructed ureter Left   ? repair-operative  ? RENAL CRYOABLATION Right 09/2014  ? TONSILLECTOMY    ? TOTAL ABDOMINAL HYSTERECTOMY W/ BILATERAL SALPINGOOPHORECTOMY    ? ? ?Family History  ?Problem Relation Age of Onset  ? Parkinsonism Father   ? Hypertension Other   ? Heart disease Other   ?     CAD.Marland Kitchen1ST  DEGREE RELATIVE <60  ? Hyperlipidemia Other   ? Breast cancer Sister   ? Breast cancer Other   ? Colon cancer Neg Hx   ? Esophageal cancer Neg Hx   ? Rectal cancer Neg Hx   ? Stomach cancer Neg Hx   ? ? ?Social History  ? ?Socioeconomic History  ? Marital status: Widowed  ?  Spouse name: Not on file  ? Number of children: 2  ? Years of education: Not on file  ? Highest education level: Not on file  ?Occupational History  ? Occupation: Retired  ?  Employer: RETIRED  ?Tobacco Use  ? Smoking status: Former  ? Smokeless tobacco: Never  ?Substance and Sexual Activity  ? Alcohol use: No  ? Drug use: No  ? Sexual activity: Not Currently  ?Other Topics Concern  ? Not on file  ?Social History Narrative  ? REG EXERCISE  ? WIDOW  ? LIVES ALONE  ? END OF LIFE:PATIENT HAS LIVING WILL AND CLEARLY STATES SHE DOES NOT WANT CARDIAC [place] VENTILATION OR OTHER HEROIC OR  FUTILE MEASURES.  ? ?Social Determinants of Health  ? ?Financial Resource Strain: Low Risk   ? Difficulty of Paying Living Expenses: Not hard at all  ?Food Insecurity: No Food Insecurity  ? Worried About Charity fundraiser in the Last Year: Never true  ? Ran Out of Food in the Last Year: Never true  ?Transportation Needs: No Transportation Needs  ? Lack of Transportation (Medical): No  ? Lack of Transportation (Non-Medical): No  ?Physical Activity: Sufficiently Active  ? Days of Exercise per Week: 5 days  ? Minutes of Exercise per Session: 50 min  ?Stress: No Stress Concern Present  ? Feeling of Stress : Not at all  ?Social Connections: Moderately Isolated  ? Frequency of Communication with Friends and Family: More than three times a week  ? Frequency of Social Gatherings with Friends and Family: More than three times a week  ? Attends Religious Services: More than 4 times per year  ? Active Member of Clubs or Organizations: No  ? Attends Archivist Meetings: Never  ? Marital Status: Widowed  ?Intimate Partner Violence: Not on file  ? ? ?Outpatient Medications Prior to Visit  ?Medication Sig Dispense Refill  ? ALPRAZolam (XANAX) 0.5 MG tablet TAKE 1 TABLET BY MOUTH TWICE DAILY AS NEEDED FOR ANXIETY 180 tablet 0  ? aspirin 81 MG EC tablet Take 81 mg by mouth daily.    ? Cholecalciferol (VITAMIN D3) 1000 UNITS CAPS Take 1,000 Units by mouth daily.    ? ferrous fumarate (HEMOCYTE - 106 MG FE) 325 (106 Fe) MG TABS tablet Take 325 mg of iron by mouth daily.    ? fluticasone (FLONASE) 50 MCG/ACT nasal spray Place 2 sprays into both nostrils daily as needed for allergies or rhinitis.    ? folic acid (FOLVITE) 1 MG tablet Take 1 mg by mouth daily.    ? furosemide (LASIX) 40 MG tablet Take 1 tablet (40 mg total) by mouth daily as needed. 90 tablet 3  ? hydroxypropyl methylcellulose / hypromellose (ISOPTO TEARS / GONIOVISC) 2.5 % ophthalmic solution Place 1 drop into both eyes daily as needed for dry eyes.    ?  ibuprofen (ADVIL) 200 MG tablet Take 400 mg by mouth every 6 (six) hours as needed for mild pain.    ? levofloxacin (LEVAQUIN) 500 MG tablet Take 1 tablet (500 mg total) by mouth daily. 7 tablet 0  ?  levothyroxine (SYNTHROID) 50 MCG tablet Take 1 tablet (50 mcg total) by mouth daily. 90 tablet 3  ? Multiple Vitamin (MULTIVITAMIN WITH MINERALS) TABS tablet Take 1 tablet by mouth daily.    ? oxymetazoline (AFRIN) 0.05 % nasal spray Place 1 spray into both nostrils daily as needed for congestion.    ? potassium chloride SA (KLOR-CON) 20 MEQ tablet Take 1 tablet (20 mEq total) by mouth 2 (two) times daily. 180 tablet 3  ? rOPINIRole (REQUIP) 1 MG tablet Take 1 tablet (1 mg total) by mouth at bedtime. 90 tablet 3  ? simvastatin (ZOCOR) 20 MG tablet Take 1 tablet (20 mg total) by mouth daily. 90 tablet 3  ? tiZANidine (ZANAFLEX) 2 MG tablet Take 2 mg by mouth daily as needed for muscle spasms.    ? fluconazole (DIFLUCAN) 100 MG tablet TAKE 1 TABLET BY MOUTH  DAILY AS NEEDED FOR YEAST  INFECTION 90 tablet 1  ? promethazine (PHENERGAN) 25 MG tablet Take 1 tablet (25 mg total) by mouth 2 (two) times daily. 180 tablet 1  ? traMADol (ULTRAM) 50 MG tablet Take 1 tablet (50 mg total) by mouth every 6 (six) hours as needed. for pain 120 tablet 1  ? ?No facility-administered medications prior to visit.  ? ? ?Allergies  ?Allergen Reactions  ? Zofran [Ondansetron Hcl] Anaphylaxis and Other (See Comments)  ?  Tongue swelling, lost of voice  ? Clarithromycin   ?  Pt does not remember the reaction  ? Codeine   ?  Per the pt, "It made me disoriented"  ? Diazepam Other (See Comments)  ?  Per the pt, "I don't remember the reaction"  ? Sulfonamide Derivatives   ?  Per the pt, "It made me disoriented"  ? Prilosec [Omeprazole Magnesium] Rash  ? Trazodone Anxiety  ? ? ?Review of Systems  ?Constitutional:  Positive for malaise/fatigue and weight loss. Negative for fever.  ?HENT:  Negative for congestion, ear pain, hearing loss, sinus pain and  sore throat.   ?Eyes:  Negative for blurred vision and pain.  ?Respiratory:  Negative for cough, sputum production, shortness of breath and wheezing.   ?Cardiovascular:  Negative for chest pain and palpi

## 2021-12-27 LAB — DRUG TOX MONITOR 1 W/CONF, ORAL FLD
Amphetamines: NEGATIVE ng/mL (ref <10)
Barbiturates: NEGATIVE ng/mL (ref <10)
Benzodiazepines: NEGATIVE ng/mL (ref <0.50)
Buprenorphine: NEGATIVE ng/mL (ref <0.10)
Cocaine: NEGATIVE ng/mL (ref <5.0)
Fentanyl: NEGATIVE ng/mL (ref <0.10)
Heroin Metabolite: NEGATIVE ng/mL (ref <1.0)
MARIJUANA: NEGATIVE ng/mL (ref <2.5)
MDMA: NEGATIVE ng/mL (ref <10)
Meprobamate: NEGATIVE ng/mL (ref <2.5)
Methadone: NEGATIVE ng/mL (ref <5.0)
Nicotine Metabolite: NEGATIVE ng/mL (ref <5.0)
Opiates: NEGATIVE ng/mL (ref <2.5)
Phencyclidine: NEGATIVE ng/mL (ref <10)
Tapentadol: NEGATIVE ng/mL (ref <5.0)
Tramadol: 210.9 ng/mL — ABNORMAL HIGH (ref <5.0)
Tramadol: POSITIVE ng/mL — AB (ref <5.0)
Zolpidem: NEGATIVE ng/mL (ref <5.0)

## 2022-03-10 ENCOUNTER — Ambulatory Visit: Payer: Medicare Other

## 2022-03-10 NOTE — Progress Notes (Signed)
Subjective:   [name] is a 88 y.o. female who presents for Medicare Annual (Subsequent) preventive examination.  I connected with  [name] on 03/11/22 by a audio enabled telemedicine application and verified that I am speaking with the correct person using two identifiers.  Patient Location: Home  Provider Location: Office/Clinic  I discussed the limitations of evaluation and management by telemedicine. The patient expressed understanding and agreed to proceed.   Review of Systems     Cardiac Risk Factors include: advanced age (>46mn, >>28women);dyslipidemia     Objective:    There were no vitals filed for this visit. There is no height or weight on file to calculate BMI.     03/11/2022    9:04 AM 03/05/2021    8:26 AM 05/12/2019    9:12 AM 07/07/2018    8:40 AM 02/04/2018    6:20 AM 11/05/2017    9:26 AM 10/23/2016    8:07 AM  Advanced Directives  Does Patient Have a Medical Advance Directive? Yes Yes Yes No Yes Yes Yes  Type of AParamedicof ARoad RunnerLiving will HBryantownLiving will HPhilomathLiving will  HRalstonLiving will HHilshire VillageLiving will HCosmosLiving will  Does patient want to make changes to medical advance directive?   No - Patient declined      Copy of HCauseyin Chart? No - copy requested Yes - validated most recent copy scanned in chart (See row information) Yes - validated most recent copy scanned in chart (See row information)  Yes Yes Yes  Would patient like information on creating a medical advance directive?    Yes (MAU/Ambulatory/Procedural Areas - Information given)       Current Medications (verified) Outpatient Encounter Medications as of 03/11/2022  Medication Sig   ALPRAZolam (XANAX) 0.5 MG tablet TAKE 1 TABLET BY MOUTH TWICE DAILY AS NEEDED FOR ANXIETY   aspirin 81 MG EC tablet Take 81 mg by  mouth daily.   Cholecalciferol (VITAMIN D3) 1000 UNITS CAPS Take 1,000 Units by mouth daily.   ferrous fumarate (HEMOCYTE - 106 MG FE) 325 (106 Fe) MG TABS tablet Take 325 mg of iron by mouth daily.   fluconazole (DIFLUCAN) 100 MG tablet TAKE 1 TABLET BY MOUTH  DAILY AS NEEDED FOR YEAST  INFECTION   fluticasone (FLONASE) 50 MCG/ACT nasal spray Place 2 sprays into both nostrils daily as needed for allergies or rhinitis.   folic acid (FOLVITE) 1 MG tablet Take 1 mg by mouth daily.   furosemide (LASIX) 40 MG tablet Take 1 tablet (40 mg total) by mouth daily as needed.   hydroxypropyl methylcellulose / hypromellose (ISOPTO TEARS / GONIOVISC) 2.5 % ophthalmic solution Place 1 drop into both eyes daily as needed for dry eyes.   ibuprofen (ADVIL) 200 MG tablet Take 400 mg by mouth every 6 (six) hours as needed for mild pain.   levofloxacin (LEVAQUIN) 500 MG tablet Take 1 tablet (500 mg total) by mouth daily.   levothyroxine (SYNTHROID) 50 MCG tablet Take 1 tablet (50 mcg total) by mouth daily.   Multiple Vitamin (MULTIVITAMIN WITH MINERALS) TABS tablet Take 1 tablet by mouth daily.   oxymetazoline (AFRIN) 0.05 % nasal spray Place 1 spray into both nostrils daily as needed for congestion.   potassium chloride SA (KLOR-CON) 20 MEQ tablet Take 1 tablet (20 mEq total) by mouth 2 (two) times daily.   promethazine (  PHENERGAN) 25 MG tablet Take 1 tablet (25 mg total) by mouth 2 (two) times daily.   rOPINIRole (REQUIP) 1 MG tablet Take 1 tablet (1 mg total) by mouth at bedtime.   simvastatin (ZOCOR) 20 MG tablet Take 1 tablet (20 mg total) by mouth daily.   tiZANidine (ZANAFLEX) 2 MG tablet Take 2 mg by mouth daily as needed for muscle spasms.   traMADol (ULTRAM) 50 MG tablet Take 1 tablet (50 mg total) by mouth every 6 (six) hours as needed. for pain   No facility-administered encounter medications on file as of 03/11/2022.    Allergies (verified) Zofran [ondansetron hcl], Clarithromycin, Codeine, Diazepam,  Sulfonamide derivatives, Prilosec [omeprazole magnesium], and Trazodone   History: Past Medical History:  Diagnosis Date   Allergy    Anemia    Anxiety    Cataract 05/2011   Chronic low blood pressure    due to dehydration   Depression    Esophageal disorder    Esophageal yeast infection ([place])    GERD (gastroesophageal reflux disease)    Hearing loss    hearing aids   Hyperlipidemia    Hypothyroidism    Myocardial infarction [facility])    Osteoporosis    Renal cell carcinoma ([place])    found on CT   Thyroid disease    hypothyroidism   Transfusion history    last 2004   Past Surgical History:  Procedure Laterality Date   APPENDECTOMY     BLEPHAROPLASTY Right    x 3 (all related to basal cell carcinoma)   COLONOSCOPY     CORONARY ARTERY BYPASS GRAFT     x2 vessel bypass -Dr. Burt Knack -[facility] 06-22-14   EUS N/A 02/04/2018   Procedure: UPPER ENDOSCOPIC ULTRASOUND (EUS) RADIAL;  Surgeon: Milus Banister, MD;  [facility];  Service: Endoscopy;  Laterality: N/A;   FINE NEEDLE ASPIRATION N/A 02/04/2018   Procedure: FINE NEEDLE ASPIRATION (FNA) LINEAR;  Surgeon: Milus Banister, MD;  [facility];  Service: Endoscopy;  Laterality: N/A;   IR GENERIC HISTORICAL  08/29/2014   IR RADIOLOGIST EVAL & MGMT 08/29/2014 Aletta Edouard, MD [facility] INTERV RAD   IR GENERIC HISTORICAL  10/22/2016   IR RADIOLOGIST EVAL & MGMT 10/22/2016 Aletta Edouard, MD [facility] INTERV RAD   IR RADIOLOGIST EVAL & MGMT  11/03/2017   IR RADIOLOGIST EVAL & MGMT  02/15/2019   KYPHOSIS SURGERY     obstructed ureter Left    repair-operative   RENAL CRYOABLATION Right 09/2014   TONSILLECTOMY     TOTAL ABDOMINAL HYSTERECTOMY W/ BILATERAL SALPINGOOPHORECTOMY     Family History  Problem Relation Age of Onset   Parkinsonism Father    Hypertension Other    Heart disease Other        CAD.Marland Kitchen1ST DEGREE RELATIVE <60   Hyperlipidemia Other    Breast cancer Sister    Breast cancer Other    Colon cancer Neg  Hx    Esophageal cancer Neg Hx    Rectal cancer Neg Hx    Stomach cancer Neg Hx    Social History   Socioeconomic History   Marital status: Widowed    Spouse name: Not on file   Number of children: 2   Years of education: Not on file   Highest education level: Not on file  Occupational History   Occupation: Retired    Fish farm manager: RETIRED  Tobacco Use   Smoking status: Former   Smokeless tobacco: Never  Substance and Sexual Activity  Alcohol use: No   Drug use: No   Sexual activity: Not Currently  Other Topics Concern   Not on file  Social History Narrative   REG EXERCISE   WIDOW   LIVES ALONE   END OF LIFE:PATIENT HAS LIVING WILL AND CLEARLY STATES SHE DOES NOT WANT CARDIAC [place] OR OTHER HEROIC OR FUTILE MEASURES.   Social Determinants of Health   Financial Resource Strain: Low Risk  (03/05/2021)   Overall Financial Resource Strain (CARDIA)    Difficulty of Paying Living Expenses: Not hard at all  Food Insecurity: No Food Insecurity (03/05/2021)   Hunger Vital Sign    Worried About Running Out of Food in the Last Year: Never true    Ran Out of Food in the Last Year: Never true  Transportation Needs: No Transportation Needs (03/05/2021)   PRAPARE - Hydrologist (Medical): No    Lack of Transportation (Non-Medical): No  Physical Activity: Sufficiently Active (03/05/2021)   Exercise Vital Sign    Days of Exercise per Week: 5 days    Minutes of Exercise per Session: 50 min  Stress: No Stress Concern Present (03/05/2021)   [place]    Feeling of Stress : Not at all  Social Connections: Moderately Isolated (03/05/2021)   Social Connection and Isolation Panel [NHANES]    Frequency of Communication with Friends and Family: More than three times a week    Frequency of Social Gatherings with Friends and Family: More than three times a week    Attends  Religious Services: More than 4 times per year    Active Member of [employer] or Organizations: No    Attends Archivist Meetings: Never    Marital Status: Widowed    Tobacco Counseling Counseling given: Not Answered   Clinical Intake:  Pre-visit preparation completed: Yes  Pain : No/denies pain     BMI - recorded: 14.4 Nutritional Status: BMI <19  Underweight Nutritional Risks: None Diabetes: No  How often do you need to have someone help you when you read instructions, pamphlets, or other written materials from your doctor or pharmacy?: 1 - Never  Diabetic?no  Interpreter Needed?: No  Information entered by :: Kenzleigh Sedam   Activities of Daily Living    03/11/2022    9:06 AM  In your present state of health, do you have any difficulty performing the following activities:  Hearing? 1  Vision? 0  Difficulty concentrating or making decisions? 0  Walking or climbing stairs? 1  Dressing or bathing? 0  Doing errands, shopping? 1  Preparing Food and eating ? N  Using the Toilet? N  In the past six months, have you accidently leaked urine? N  Do you have problems with loss of bowel control? N  Managing your Medications? N  Managing your Finances? N  Housekeeping or managing your Housekeeping? N    Patient Care Team: Carollee Herter, Alferd Apa, DO as PCP - General Milus Banister, MD as Consulting Physician (Gastroenterology) Aletta Edouard, MD as Consulting Physician (Interventional Radiology) [place], Amy, MD as Consulting Physician (Dermatology) Sherren Mocha, MD as Consulting Physician (Cardiology) Monna Fam, MD as Consulting Physician (Ophthalmology)  Indicate any recent Medical Services you may have received from other than [facility] providers in the past year (date may be approximate).     Assessment:   This is a routine wellness examination for [place].  Hearing/Vision screen No results found.  Dietary issues and exercise activities  discussed: Current Exercise Habits: Home exercise routine, Type of exercise: walking, Time (Minutes): 60, Frequency (Times/Week): 7, Weekly Exercise (Minutes/Week): 420, Intensity: Mild, Exercise limited by: None identified   Goals Addressed             This Visit's Progress    Maintain current health and independence   On track    Patient Stated   Not on track    Drink more water       Depression Screen    03/11/2022    9:05 AM 03/05/2021    8:28 AM 05/12/2019    9:12 AM 11/05/2017    9:26 AM 10/23/2016    8:05 AM 09/27/2015    8:59 AM 09/27/2015    8:25 AM  PHQ 2/9 Scores  PHQ - 2 Score 0 0 0 0 0 0 0    Fall Risk    03/11/2022    9:05 AM 03/05/2021    8:27 AM 06/21/2020    9:51 AM 05/12/2019    9:12 AM 11/05/2017    9:26 AM  Fall Risk   Falls in the past year? 0 0 0 1 No  Number falls in past yr: 0 0 0 0   Injury with Fall? 0 0 0 1   Risk for fall due to : History of fall(s)      Follow up Falls evaluation completed Falls prevention discussed Falls evaluation completed      [place]:  Any stairs in or around the home? No  If so, are there any without handrails?  N/a Home free of loose throw rugs in walkways, pet beds, electrical cords, etc? Yes  Adequate lighting in your home to reduce risk of falls? Yes   ASSISTIVE DEVICES UTILIZED TO PREVENT FALLS:  Life alert? Yes  Use of a cane, walker or w/c? Yes  Grab bars in the bathroom? Yes  Shower chair or bench in shower? Yes  Elevated toilet seat or a handicapped toilet? Yes   TIMED UP AND GO:  Was the test performed? No .    Cognitive Function:    11/05/2017    9:30 AM 10/23/2016    8:08 AM  MMSE - Mini Mental State Exam  Orientation to time 5 5  Orientation to Place 5 5  Registration 3 3  Attention/ Calculation 5 5  Recall 3 3  Language- name 2 objects 2 2  Language- repeat 1 1  Language- follow 3 step command 3 3  Language- read & follow direction 1 1  Write a sentence  1 1  Copy design 1 1  Total score 30 30        03/11/2022    9:10 AM 05/12/2019    9:21 AM  6CIT Screen  What Year? 0 points 0 points  What month? 0 points 0 points  What time? 0 points 0 points  Count back from 20 0 points 0 points  Months in reverse 0 points 0 points  Repeat phrase 0 points 0 points  Total Score 0 points 0 points    Immunizations Immunization History  Administered Date(s) Administered   Fluad Quad(high Dose 65+) 06/16/2021   Influenza Split 05/26/2011, 05/31/2012   Influenza Whole 05/31/2008, 05/17/2009, 05/22/2010   Influenza, High Dose Seasonal PF 05/21/2014, 04/30/2015, 05/18/2016, 05/09/2018, 04/22/2019   Influenza,inj,Quad PF,6+ Mos 06/06/2013   Influenza-Unspecified 04/09/2017, 05/09/2018, 05/21/2020   PFIZER(Purple Top)SARS-COV-2 Vaccination 10/01/2019, 10/26/2019, 05/21/2020, 02/13/2021  Pension scheme manager 51yr & up 06/16/2021   Pneumococcal Conjugate-13 08/11/2014, 04/22/2019   Pneumococcal Polysaccharide-23 06/11/2005   Tdap 06/08/2016    TDAP status: Up to date  Flu Vaccine status: Up to date  Pneumococcal vaccine status: Up to date  Covid-19 vaccine status: Completed vaccines  Qualifies for Shingles Vaccine? Yes   Zostavax completed No   Shingrix Completed?: No.    Education has been provided regarding the importance of this vaccine. Patient has been advised to call insurance company to determine out of pocket expense if they have not yet received this vaccine. Advised may also receive vaccine at local pharmacy or Health Dept. Verbalized acceptance and understanding.  Screening Tests Health Maintenance  Topic Date Due   Zoster Vaccines- Shingrix (1 of 2) Never done   INFLUENZA VACCINE  03/25/2022   TETANUS/TDAP  06/08/2026   Pneumonia Vaccine 88 Years old  Completed   DEXA SCAN  Completed   COVID-19 Vaccine  Completed   HPV VACCINES  Aged Out    Health Maintenance  Health Maintenance Due  Topic Date  Due   Zoster Vaccines- Shingrix (1 of 2) Never done    Colorectal cancer screening: No longer required.   Mammogram status: No longer required due to age.  Bone Density status: Ordered declined. Pt provided with contact info and advised to call to schedule appt.  Lung Cancer Screening: (Low Dose CT Chest recommended if Age 86-80years, 30 pack-year currently smoking OR have quit w/in 15years.) does not qualify.   Lung Cancer Screening Referral: n/a  Additional Screening:  Hepatitis C Screening: does not qualify; Completed aged out  Vision Screening: Recommended annual ophthalmology exams for early detection of glaucoma and other disorders of the eye. Is the patient up to date with their annual eye exam?  Yes  Who is the provider or what is the name of the office in which the patient attends annual eye exams? Dr. HHerbert DeanerIf pt is not established with a provider, would they like to be referred to a provider to establish care? No .   Dental Screening: Recommended annual dental exams for proper oral hygiene  Community Resource Referral / Chronic Care Management: CRR required this visit?  No   CCM required this visit?  No      Plan:     I have personally reviewed and noted the following in the patient's chart:   Medical and social history Use of alcohol, tobacco or illicit drugs  Current medications and supplements including opioid prescriptions.  Functional ability and status Nutritional status Physical activity Advanced directives List of other physicians Hospitalizations, surgeries, and ER visits in previous 12 months Vitals Screenings to include cognitive, depression, and falls Referrals and appointments  In addition, I have reviewed and discussed with patient certain preventive protocols, quality metrics, and best practice recommendations. A written personalized care plan for preventive services as well as general preventive health recommendations were provided to  patient.   Due to this being a telephonic visit, the after visit summary with patients personalized plan was offered to patient via mail or my-chart.  Patient would like to access on my-chart.    SDuard BradyChism, CCommack  03/11/2022   Nurse Notes: none

## 2022-03-11 ENCOUNTER — Ambulatory Visit (INDEPENDENT_AMBULATORY_CARE_PROVIDER_SITE_OTHER): Payer: Medicare Other

## 2022-03-11 DIAGNOSIS — Z0000 Encounter for general adult medical examination without abnormal findings: Secondary | ICD-10-CM

## 2022-03-11 NOTE — Patient Instructions (Signed)
[name] , Thank you for taking time to come for your Medicare Wellness Visit. I appreciate your ongoing commitment to your health goals. Please review the following plan we discussed and let me know if I can assist you in the future.   Screening recommendations/referrals: Colonoscopy: no longer needed Mammogram: aged out Bone Density: declined Recommended yearly ophthalmology/optometry visit for glaucoma screening and checkup Recommended yearly dental visit for hygiene and checkup  Vaccinations: Influenza vaccine: up to date Pneumococcal vaccine: up to date Tdap vaccine: up to date Shingles vaccine: Due-May obtain vaccine at your local pharmacy.    Covid-19:completed  Advanced directives: yes, not on file  Conditions/risks identified: see problem list   Next appointment: Follow up in one year for your annual wellness visit 03/13/23   Preventive Care 86 Years and Older, Female Preventive care refers to lifestyle choices and visits with your health care provider that can promote health and wellness. What does preventive care include? A yearly physical exam. This is also called an annual well check. Dental exams once or twice a year. Routine eye exams. Ask your health care provider how often you should have your eyes checked. Personal lifestyle choices, including: Daily care of your teeth and gums. Regular physical activity. Eating a healthy diet. Avoiding tobacco and drug use. Limiting alcohol use. Practicing safe sex. Taking low-dose aspirin every day. Taking vitamin and mineral supplements as recommended by your health care provider. What happens during an annual well check? The services and screenings done by your health care provider during your annual well check will depend on your age, overall health, lifestyle risk factors, and family history of disease. Counseling  Your health care provider may ask you questions about your: Alcohol use. Tobacco use. Drug  use. Emotional well-being. Home and relationship well-being. Sexual activity. Eating habits. History of falls. Memory and ability to understand (cognition). Work and work Statistician. Reproductive health. Screening  You may have the following tests or measurements: Height, weight, and BMI. Blood pressure. Lipid and cholesterol levels. These may be checked every 5 years, or more frequently if you are over 86 years old. Skin check. Lung cancer screening. You may have this screening every year starting at age 86 if you have a 30-pack-year history of smoking and currently smoke or have quit within the past 15 years. Fecal occult blood test (FOBT) of the stool. You may have this test every year starting at age 86. Flexible sigmoidoscopy or colonoscopy. You may have a sigmoidoscopy every 5 years or a colonoscopy every 10 years starting at age 86. Hepatitis C blood test. Hepatitis B blood test. Sexually transmitted disease (STD) testing. Diabetes screening. This is done by checking your blood sugar (glucose) after you have not eaten for a while (fasting). You may have this done every 1-3 years. Bone density scan. This is done to screen for osteoporosis. You may have this done starting at age 86. Mammogram. This may be done every 1-2 years. Talk to your health care provider about how often you should have regular mammograms. Talk with your health care provider about your test results, treatment options, and if necessary, the need for more tests. Vaccines  Your health care provider may recommend certain vaccines, such as: Influenza vaccine. This is recommended every year. Tetanus, diphtheria, and acellular pertussis (Tdap, Td) vaccine. You may need a Td booster every 10 years. Zoster vaccine. You may need this after age 86. Pneumococcal 13-valent conjugate (PCV13) vaccine. One dose is recommended after age 86. Pneumococcal  polysaccharide (PPSV23) vaccine. One dose is recommended after age  86. Talk to your health care provider about which screenings and vaccines you need and how often you need them. This information is not intended to replace advice given to you by your health care provider. Make sure you discuss any questions you have with your health care provider. Document Released: 09/07/2015 Document Revised: 04/30/2016 Document Reviewed: 06/12/2015 Elsevier Interactive Patient Education  2017 [employer] Prevention in the Home Falls can cause injuries. They can happen to people of all ages. There are many things you can do to make your home safe and to help prevent falls. What can I do on the outside of my home? Regularly fix the edges of walkways and driveways and fix any cracks. Remove anything that might make you trip as you walk through a door, such as a raised step or threshold. Trim any bushes or trees on the path to your home. Use bright outdoor lighting. Clear any walking paths of anything that might make someone trip, such as rocks or tools. Regularly check to see if handrails are loose or broken. Make sure that both sides of any steps have handrails. Any raised decks and porches should have guardrails on the edges. Have any leaves, snow, or ice cleared regularly. Use sand or salt on walking paths during winter. Clean up any spills in your garage right away. This includes oil or grease spills. What can I do in the bathroom? Use night lights. Install grab bars by the toilet and in the tub and shower. Do not use towel bars as grab bars. Use non-skid mats or decals in the tub or shower. If you need to sit down in the shower, use a plastic, non-slip stool. Keep the floor dry. Clean up any water that spills on the floor as soon as it happens. Remove soap buildup in the tub or shower regularly. Attach bath mats securely with double-sided non-slip rug tape. Do not have throw rugs and other things on the floor that can make you trip. What can I do in the  bedroom? Use night lights. Make sure that you have a light by your bed that is easy to reach. Do not use any sheets or blankets that are too big for your bed. They should not hang down onto the floor. Have a firm chair that has side arms. You can use this for support while you get dressed. Do not have throw rugs and other things on the floor that can make you trip. What can I do in the kitchen? Clean up any spills right away. Avoid walking on wet floors. Keep items that you use a lot in easy-to-reach places. If you need to reach something above you, use a strong step stool that has a grab bar. Keep electrical cords out of the way. Do not use floor polish or wax that makes floors slippery. If you must use wax, use non-skid floor wax. Do not have throw rugs and other things on the floor that can make you trip. What can I do with my stairs? Do not leave any items on the stairs. Make sure that there are handrails on both sides of the stairs and use them. Fix handrails that are broken or loose. Make sure that handrails are as long as the stairways. Check any carpeting to make sure that it is firmly attached to the stairs. Fix any carpet that is loose or worn. Avoid having throw rugs at the top  or bottom of the stairs. If you do have throw rugs, attach them to the floor with carpet tape. Make sure that you have a light switch at the top of the stairs and the bottom of the stairs. If you do not have them, ask someone to add them for you. What else can I do to help prevent falls? Wear shoes that: Do not have high heels. Have rubber bottoms. Are comfortable and fit you well. Are closed at the toe. Do not wear sandals. If you use a stepladder: Make sure that it is fully opened. Do not climb a closed stepladder. Make sure that both sides of the stepladder are locked into place. Ask someone to hold it for you, if possible. Clearly mark and make sure that you can see: Any grab bars or  handrails. First and last steps. Where the edge of each step is. Use tools that help you move around (mobility aids) if they are needed. These include: Canes. Walkers. Scooters. Crutches. Turn on the lights when you go into a dark area. Replace any light bulbs as soon as they burn out. Set up your furniture so you have a clear path. Avoid moving your furniture around. If any of your floors are uneven, fix them. If there are any pets around you, be aware of where they are. Review your medicines with your doctor. Some medicines can make you feel dizzy. This can increase your chance of falling. Ask your doctor what other things that you can do to help prevent falls. This information is not intended to replace advice given to you by your health care provider. Make sure you discuss any questions you have with your health care provider. Document Released: 06/07/2009 Document Revised: 01/17/2016 Document Reviewed: 09/15/2014 Elsevier Interactive Patient Education  2017 [employer].

## 2022-03-24 ENCOUNTER — Encounter: Payer: Self-pay | Admitting: Family Medicine

## 2022-03-24 DIAGNOSIS — H6120 Impacted cerumen, unspecified ear: Secondary | ICD-10-CM

## 2022-04-14 DIAGNOSIS — H6121 Impacted cerumen, right ear: Secondary | ICD-10-CM | POA: Diagnosis not present

## 2022-07-01 ENCOUNTER — Ambulatory Visit (INDEPENDENT_AMBULATORY_CARE_PROVIDER_SITE_OTHER): Payer: Medicare Other | Admitting: Family Medicine

## 2022-07-01 ENCOUNTER — Encounter: Payer: Self-pay | Admitting: Family Medicine

## 2022-07-01 VITALS — BP 120/60 | HR 84 | Temp 98.4°F | Resp 18 | Ht 62.5 in | Wt 82.4 lb

## 2022-07-01 DIAGNOSIS — Z23 Encounter for immunization: Secondary | ICD-10-CM

## 2022-07-01 DIAGNOSIS — R110 Nausea: Secondary | ICD-10-CM

## 2022-07-01 DIAGNOSIS — G2581 Restless legs syndrome: Secondary | ICD-10-CM

## 2022-07-01 DIAGNOSIS — J449 Chronic obstructive pulmonary disease, unspecified: Secondary | ICD-10-CM

## 2022-07-01 DIAGNOSIS — E43 Unspecified severe protein-calorie malnutrition: Secondary | ICD-10-CM | POA: Diagnosis not present

## 2022-07-01 DIAGNOSIS — Z0000 Encounter for general adult medical examination without abnormal findings: Secondary | ICD-10-CM | POA: Diagnosis not present

## 2022-07-01 DIAGNOSIS — E785 Hyperlipidemia, unspecified: Secondary | ICD-10-CM

## 2022-07-01 DIAGNOSIS — E559 Vitamin D deficiency, unspecified: Secondary | ICD-10-CM | POA: Diagnosis not present

## 2022-07-01 DIAGNOSIS — F419 Anxiety disorder, unspecified: Secondary | ICD-10-CM

## 2022-07-01 DIAGNOSIS — M159 Polyosteoarthritis, unspecified: Secondary | ICD-10-CM

## 2022-07-01 DIAGNOSIS — N1832 Chronic kidney disease, stage 3b: Secondary | ICD-10-CM | POA: Diagnosis not present

## 2022-07-01 DIAGNOSIS — M810 Age-related osteoporosis without current pathological fracture: Secondary | ICD-10-CM

## 2022-07-01 DIAGNOSIS — L089 Local infection of the skin and subcutaneous tissue, unspecified: Secondary | ICD-10-CM

## 2022-07-01 DIAGNOSIS — N289 Disorder of kidney and ureter, unspecified: Secondary | ICD-10-CM

## 2022-07-01 DIAGNOSIS — E039 Hypothyroidism, unspecified: Secondary | ICD-10-CM | POA: Diagnosis not present

## 2022-07-01 DIAGNOSIS — B3781 Candidal esophagitis: Secondary | ICD-10-CM

## 2022-07-01 DIAGNOSIS — E876 Hypokalemia: Secondary | ICD-10-CM

## 2022-07-01 LAB — CBC WITH DIFFERENTIAL/PLATELET
Basophils Absolute: 0 10*3/uL (ref 0.0–0.1)
Basophils Relative: 0.5 % (ref 0.0–3.0)
Eosinophils Absolute: 0 10*3/uL (ref 0.0–0.7)
Eosinophils Relative: 0.4 % (ref 0.0–5.0)
HCT: 37.3 % (ref 36.0–46.0)
Hemoglobin: 12.4 g/dL (ref 12.0–15.0)
Lymphocytes Relative: 27 % (ref 12.0–46.0)
Lymphs Abs: 1.3 10*3/uL (ref 0.7–4.0)
MCHC: 33.1 g/dL (ref 30.0–36.0)
MCV: 92.3 fl (ref 78.0–100.0)
Monocytes Absolute: 0.4 10*3/uL (ref 0.1–1.0)
Monocytes Relative: 7.9 % (ref 3.0–12.0)
Neutro Abs: 3.1 10*3/uL (ref 1.4–7.7)
Neutrophils Relative %: 64.2 % (ref 43.0–77.0)
Platelets: 285 10*3/uL (ref 150.0–400.0)
RBC: 4.04 Mil/uL (ref 3.87–5.11)
RDW: 12.5 % (ref 11.5–15.5)
WBC: 4.9 10*3/uL (ref 4.0–10.5)

## 2022-07-01 LAB — COMPREHENSIVE METABOLIC PANEL
ALT: 25 U/L (ref 0–35)
AST: 35 U/L (ref 0–37)
Albumin: 4.2 g/dL (ref 3.5–5.2)
Alkaline Phosphatase: 62 U/L (ref 39–117)
BUN: 13 mg/dL (ref 6–23)
CO2: 27 mEq/L (ref 19–32)
Calcium: 10 mg/dL (ref 8.4–10.5)
Chloride: 96 mEq/L (ref 96–112)
Creatinine, Ser: 1.25 mg/dL — ABNORMAL HIGH (ref 0.40–1.20)
GFR: 37.5 mL/min — ABNORMAL LOW (ref >60.00)
Glucose, Bld: 91 mg/dL (ref 70–99)
Potassium: 4.1 mEq/L (ref 3.5–5.1)
Sodium: 132 mEq/L — ABNORMAL LOW (ref 135–145)
Total Bilirubin: 0.4 mg/dL (ref 0.2–1.2)
Total Protein: 6.5 g/dL (ref 6.0–8.3)

## 2022-07-01 LAB — LIPID PANEL
Cholesterol: 153 mg/dL (ref 0–200)
HDL: 72.3 mg/dL (ref >39.00)
LDL Cholesterol: 68 mg/dL (ref 0–99)
NonHDL: 81.03
Total CHOL/HDL Ratio: 2
Triglycerides: 67 mg/dL (ref 0.0–149.0)
VLDL: 13.4 mg/dL (ref 0.0–40.0)

## 2022-07-01 LAB — VITAMIN B12: Vitamin B-12: >1500 pg/mL — ABNORMAL HIGH (ref 211–911)

## 2022-07-01 LAB — VITAMIN D 25 HYDROXY (VIT D DEFICIENCY, FRACTURES): VITD: 86.48 ng/mL (ref 30.00–100.00)

## 2022-07-01 MED ORDER — PROMETHAZINE HCL 25 MG PO TABS: 25.0000 mg | ORAL_TABLET | Freq: Two times a day (BID) | ORAL | 1 refills | Status: DC

## 2022-07-01 MED ORDER — FLUTICASONE PROPIONATE 50 MCG/ACT NA SUSP: 2.0000 | Freq: Every day | NASAL | 3 refills | Status: DC | PRN

## 2022-07-01 MED ORDER — SIMVASTATIN 20 MG PO TABS: 20.0000 mg | ORAL_TABLET | Freq: Every day | ORAL | 3 refills | Status: DC

## 2022-07-01 MED ORDER — LEVOFLOXACIN 500 MG PO TABS: 500.0000 mg | ORAL_TABLET | Freq: Every day | ORAL | 0 refills | Status: DC

## 2022-07-01 MED ORDER — FOLIC ACID 1 MG PO TABS: 1.0000 mg | ORAL_TABLET | Freq: Every day | ORAL | 3 refills | Status: DC

## 2022-07-01 MED ORDER — ROPINIROLE HCL 1 MG PO TABS: 1.0000 mg | ORAL_TABLET | Freq: Every day | ORAL | 3 refills | Status: DC

## 2022-07-01 MED ORDER — FERROUS FUMARATE 325 (106 FE) MG PO TABS: 325.0000 mg | ORAL_TABLET | Freq: Every day | ORAL | 3 refills | Status: DC

## 2022-07-01 MED ORDER — VITAMIN D3 25 MCG (1000 UT) PO CAPS: 1000.0000 [IU] | ORAL_CAPSULE | Freq: Every day | ORAL | 3 refills | Status: AC

## 2022-07-01 MED ORDER — POTASSIUM CHLORIDE CRYS ER 20 MEQ PO TBCR: 20.0000 meq | EXTENDED_RELEASE_TABLET | Freq: Two times a day (BID) | ORAL | 3 refills | Status: DC

## 2022-07-01 MED ORDER — ALPRAZOLAM 0.5 MG PO TABS: 0.5000 mg | ORAL_TABLET | Freq: Two times a day (BID) | ORAL | 0 refills | Status: DC | PRN

## 2022-07-01 MED ORDER — TRAMADOL HCL 50 MG PO TABS: 50.0000 mg | ORAL_TABLET | Freq: Four times a day (QID) | ORAL | 1 refills | Status: DC | PRN

## 2022-07-01 MED ORDER — FUROSEMIDE 40 MG PO TABS: 40.0000 mg | ORAL_TABLET | Freq: Every day | ORAL | 3 refills | Status: DC | PRN

## 2022-07-01 MED ORDER — FLUCONAZOLE 100 MG PO TABS: ORAL_TABLET | ORAL | 1 refills | Status: DC

## 2022-07-01 MED ORDER — LEVOTHYROXINE SODIUM 50 MCG PO TABS: 50.0000 ug | ORAL_TABLET | Freq: Every day | ORAL | 3 refills | Status: DC

## 2022-07-01 MED ORDER — TIZANIDINE HCL 2 MG PO TABS: 2.0000 mg | ORAL_TABLET | Freq: Every day | ORAL | 0 refills | Status: DC | PRN

## 2022-07-01 NOTE — Progress Notes (Signed)
Subjective:     [name] is a 88 y.o. female and is here for a comprehensive physical exam. The patient reports  no new problems --- her daughter is with her.  She drinks pedialyte on occasion and still eats very little  .  Social History   Socioeconomic History   Marital status: Widowed    Spouse name: Not on file   Number of children: 2   Years of education: Not on file   Highest education level: Not on file  Occupational History   Occupation: Retired    Fish farm manager: RETIRED  Tobacco Use   Smoking status: Former   Smokeless tobacco: Never  Substance and Sexual Activity   Alcohol use: No   Drug use: No   Sexual activity: Not Currently  Other Topics Concern   Not on file  Social History Narrative   REG EXERCISE   WIDOW   LIVES ALONE   END OF [place] HEROIC OR FUTILE MEASURES.   Social Determinants of Health   Financial Resource Strain: Low Risk  (03/05/2021)   Overall Financial Resource Strain (CARDIA)    Difficulty of Paying Living Expenses: Not hard at all  Food Insecurity: No Food Insecurity (03/05/2021)   Hunger Vital Sign    Worried About Running Out of Food in the Last Year: Never true    Ran Out of Food in the Last Year: Never true  Transportation Needs: No Transportation Needs (03/05/2021)   PRAPARE - Hydrologist (Medical): No    Lack of Transportation (Non-Medical): No  Physical Activity: Sufficiently Active (03/05/2021)   Exercise Vital Sign    Days of Exercise per Week: 5 days    Minutes of Exercise per Session: 50 min  Stress: No Stress Concern Present (03/05/2021)   [place]    Feeling of Stress : Not at all  Social Connections: Moderately Isolated (03/05/2021)   Social Connection and Isolation Panel [NHANES]    Frequency of Communication with  Friends and Family: More than three times a week    Frequency of Social Gatherings with Friends and Family: More than three times a week    Attends Religious Services: More than 4 times per year    Active Member of [employer] or Organizations: No    Attends Archivist Meetings: Never    Marital Status: Widowed  Intimate Partner Violence: Not At Risk (03/11/2022)   Humiliation, Afraid, Rape, and Kick questionnaire    Fear of Current or Ex-Partner: No    Emotionally Abused: No    Physically Abused: No    Sexually Abused: No   Health Maintenance  Topic Date Due   COVID-19 Vaccine (6 - Pfizer risk series) 08/11/2021   Zoster Vaccines- Shingrix (1 of 2) 10/01/2022 (Originally 04/21/1947)   Medicare Annual Wellness (AWV)  03/12/2023   TETANUS/TDAP  06/08/2026   Pneumonia Vaccine 27+ Years old  Completed   INFLUENZA VACCINE  Completed   DEXA SCAN  Completed   HPV VACCINES  Aged Out    The following portions of the patient's history were reviewed and updated as appropriate: She  has a past medical history of Allergy, Anemia, Anxiety, Cataract (05/2011), Chronic low blood pressure, Depression, Esophageal disorder, Esophageal yeast infection ([place]), GERD (gastroesophageal reflux disease), Hearing loss, Hyperlipidemia, Hypothyroidism, Myocardial infarction ([place]), Osteoporosis, Renal cell carcinoma ([place]), Thyroid disease,  and Transfusion history. She does not have any pertinent problems on file. She  has a past surgical history that includes obstructed ureter (Left); Total abdominal hysterectomy w/ bilateral salpingoophorectomy; Kyphosis surgery; Tonsillectomy; Appendectomy; Colonoscopy; Coronary artery bypass graft; Renal cryoablation (Right, 09/2014); ir generic historical (08/29/2014); ir generic historical (10/22/2016); IR Radiologist Eval & Mgmt (11/03/2017); Blepharoplasty (Right); EUS (N/A, 02/04/2018); Fine needle aspiration (N/A, 02/04/2018); and IR Radiologist Eval & Mgmt (02/15/2019). Her family  history includes Breast cancer in her sister and another family member; Heart disease in an other family member; Hyperlipidemia in an other family member; Hypertension in an other family member; Parkinsonism in her father. She  reports that she has quit smoking. She has never used smokeless tobacco. She reports that she does not drink alcohol and does not use drugs. She has a current medication list which includes the following prescription(s): aspirin ec, hydroxypropyl methylcellulose / hypromellose, ibuprofen, multivitamin with minerals, oxymetazoline, alprazolam, vitamin d3, ferrous fumarate, fluconazole, fluticasone, folic acid, furosemide, levothyroxine, potassium chloride sa, promethazine, ropinirole, simvastatin, tizanidine, and tramadol. Current Outpatient Medications on File Prior to Visit  Medication Sig Dispense Refill   aspirin 81 MG EC tablet Take 81 mg by mouth daily.     hydroxypropyl methylcellulose / hypromellose (ISOPTO TEARS / GONIOVISC) 2.5 % ophthalmic solution Place 1 drop into both eyes daily as needed for dry eyes.     ibuprofen (ADVIL) 200 MG tablet Take 400 mg by mouth every 6 (six) hours as needed for mild pain.     Multiple Vitamin (MULTIVITAMIN WITH MINERALS) TABS tablet Take 1 tablet by mouth daily.     oxymetazoline (AFRIN) 0.05 % nasal spray Place 1 spray into both nostrils daily as needed for congestion.     No current facility-administered medications on file prior to visit.   She is allergic to zofran [ondansetron hcl], clarithromycin, codeine, diazepam, sulfonamide derivatives, prilosec [omeprazole magnesium], and trazodone..  Review of Systems Review of Systems  Constitutional: Negative for activity change, appetite change and fatigue.  HENT: Negative for hearing loss, congestion, tinnitus and ear discharge.  dentist [id number]: Negative for visual disturbance  Respiratory: Negative for cough, chest tightness and shortness of breath.   Cardiovascular: Negative  for chest pain, palpitations and leg swelling.  Gastrointestinal: Negative for abdominal pain, diarrhea, constipation and abdominal distention.  Genitourinary: Negative for urgency, frequency, decreased urine volume and difficulty urinating.  Musculoskeletal: inc weakness ,+ back pain Skin: Negative for color change, pallor and rash.  Neurological: Negative for dizziness, light-headedness, numbness and headaches.  Hematological: Negative for adenopathy. Does not bruise/bleed easily.  Psychiatric/Behavioral: Negative for suicidal ideas, confusion, sleep disturbance, self-injury, dysphoric mood, decreased concentration and agitation.      Objective:    BP 120/60 (BP Location: Left Arm, Patient Position: Sitting, Cuff Size: Small)   Pulse 84   Temp 98.4 F (36.9 C) (Oral)   Resp 18   Ht 5' 2.5" (1.588 m)   Wt 82 lb 6.4 oz (37.4 kg)   BMI 14.83 kg/m  General appearance: alert, cooperative, appears stated age, and no distress Head: Normocephalic, without obvious abnormality, atraumatic Eyes: conjunctivae/corneas clear. PERRL, EOM's intact. Fundi benign. Ears: normal TM's and external ear canals both ears Nose: Nares normal. Septum midline. Mucosa normal. No drainage or sinus tenderness. Throat: lips, mucosa, and tongue normal; teeth and gums normal Neck: no adenopathy, no carotid bruit, no JVD, supple, symmetrical, trachea midline, and thyroid not enlarged, symmetric, no tenderness/mass/nodules Back:  kyphosis Lungs: clear to auscultation bilaterally Heart:  regular rate and rhythm, S1, S2 normal, no murmur, click, rub or gallop Abdomen: soft, non-tender; bowel sounds normal; no masses,  no organomegaly Pelvic: deferred Extremities: extremities normal, atraumatic, no cyanosis or edema Pulses: 2+ and symmetric Skin: Skin color, texture, turgor normal. No rashes or lesions Lymph nodes: Cervical, supraclavicular, and axillary nodes normal. Neurologic: Grossly normal    Assessment:     Healthy female exam.   Check labs  Ghm utd See AVS   1. Hyperlipidemia, unspecified hyperlipidemia type Encourage heart healthy diet such as MIND or DASH diet, increase exercise, avoid trans fats, simple carbohydrates and processed foods, consider a krill or fish or flaxseed oil cap daily.   - Lipid panel - simvastatin (ZOCOR) 20 MG tablet; Take 1 tablet (20 mg total) by mouth daily.  Dispense: 90 tablet; Refill: 3  2. Hypothyroidism, unspecified type   - Thyroid Panel With TSH - levothyroxine (SYNTHROID) 50 MCG tablet; Take 1 tablet (50 mcg total) by mouth daily.  Dispense: 90 tablet; Refill: 3  3. Severe protein-calorie malnutrition ([place]) Con't with pedialyte and encourage eating  - VITAMIN D 25 Hydroxy (Vit-D Deficiency, Fractures) - Vitamin B12 - Thyroid Panel With TSH  4. Chronic obstructive pulmonary disease, unspecified COPD type ([place]) Stable   5. Anxiety Stable  - ALPRAZolam (XANAX) 0.5 MG tablet; Take 1 tablet (0.5 mg total) by mouth 2 (two) times daily as needed. for anxiety  Dispense: 180 tablet; Refill: 0  6. Vitamin D deficiency   - VITAMIN D 25 Hydroxy (Vit-D Deficiency, Fractures)  7. Chronic renal failure, stage 3b (HCC)   - CBC with Differential/Platelet - Comprehensive metabolic panel  8. Osteoporosis, unspecified osteoporosis type, unspecified pathological fracture presence    9. Preventative health care See above  - Flu Vaccine QUAD High Dose(Fluad)  10. Need for influenza vaccination   - Flu Vaccine QUAD High Dose(Fluad)  11. Esophageal candidiasis (HCC)   - fluconazole (DIFLUCAN) 100 MG tablet; TAKE 1 TABLET BY MOUTH  DAILY AS NEEDED FOR YEAST  INFECTION  Dispense: 90 tablet; Refill: 1  12. Kidney function abnormal   - furosemide (LASIX) 40 MG tablet; Take 1 tablet (40 mg total) by mouth daily as needed.  Dispense: 90 tablet; Refill: 3  13. Hypokalemia   - potassium chloride SA (KLOR-CON M) 20 MEQ tablet; Take 1 tablet (20 mEq  total) by mouth 2 (two) times daily.  Dispense: 180 tablet; Refill: 3  14. Nausea   - promethazine (PHENERGAN) 25 MG tablet; Take 1 tablet (25 mg total) by mouth 2 (two) times daily.  Dispense: 180 tablet; Refill: 1  15 RLS (restless legs syndrome)   - rOPINIRole (REQUIP) 1 MG tablet; Take 1 tablet (1 mg total) by mouth at bedtime.  Dispense: 90 tablet; Refill: 3  16. Osteoarthritis of multiple joints, unspecified osteoarthritis type   - traMADol (ULTRAM) 50 MG tablet; Take 1 tablet (50 mg total) by mouth every 6 (six) hours as needed. for pain  Dispense: 120 tablet; Refill: 1      Plan:     See After Visit Summary for Counseling Recommendations

## 2022-07-01 NOTE — Patient Instructions (Signed)
Preventive Care 65 Years and Older, Female Preventive care refers to lifestyle choices and visits with your health care provider that can promote health and wellness. Preventive care visits are also called wellness exams. What can I expect for my preventive care visit? Counseling Your health care provider may ask you questions about your: Medical history, including: Past medical problems. Family medical history. Pregnancy and menstrual history. History of falls. Current health, including: Memory and ability to understand (cognition). Emotional well-being. Home life and relationship well-being. Sexual activity and sexual health. Lifestyle, including: Alcohol, nicotine or tobacco, and drug use. Access to firearms. Diet, exercise, and sleep habits. Work and work environment. Sunscreen use. Safety issues such as seatbelt and bike helmet use. Physical exam Your health care provider will check your: Height and weight. These may be used to calculate your BMI (body mass index). BMI is a measurement that tells if you are at a healthy weight. Waist circumference. This measures the distance around your waistline. This measurement also tells if you are at a healthy weight and may help predict your risk of certain diseases, such as type 2 diabetes and high blood pressure. Heart rate and blood pressure. Body temperature. Skin for abnormal spots. What immunizations do I need?  Vaccines are usually given at various ages, according to a schedule. Your health care provider will recommend vaccines for you based on your age, medical history, and lifestyle or other factors, such as travel or where you work. What tests do I need? Screening Your health care provider may recommend screening tests for certain conditions. This may include: Lipid and cholesterol levels. Hepatitis C test. Hepatitis B test. HIV (human immunodeficiency virus) test. STI (sexually transmitted infection) testing, if you are at  risk. Lung cancer screening. Colorectal cancer screening. Diabetes screening. This is done by checking your blood sugar (glucose) after you have not eaten for a while (fasting). Mammogram. Talk with your health care provider about how often you should have regular mammograms. BRCA-related cancer screening. This may be done if you have a family history of breast, ovarian, tubal, or peritoneal cancers. Bone density scan. This is done to screen for osteoporosis. Talk with your health care provider about your test results, treatment options, and if necessary, the need for more tests. Follow these instructions at home: Eating and drinking  Eat a diet that includes fresh fruits and vegetables, whole grains, lean protein, and low-fat dairy products. Limit your intake of foods with high amounts of sugar, saturated fats, and salt. Take vitamin and mineral supplements as recommended by your health care provider. Do not drink alcohol if your health care provider tells you not to drink. If you drink alcohol: Limit how much you have to 0-1 drink a day. Know how much alcohol is in your drink. In the [place] one drink equals one 12 oz bottle of beer (355 mL), one 5 oz glass of wine (148 mL), or one 1 oz glass of hard liquor (44 mL). Lifestyle Brush your teeth every morning and night with fluoride toothpaste. Floss one time each day. Exercise for at least 30 minutes 5 or more days each week. Do not use any products that contain nicotine or tobacco. These products include cigarettes, chewing tobacco, and vaping devices, such as e-cigarettes. If you need help quitting, ask your health care provider. Do not use drugs. If you are sexually active, practice safe sex. Use a condom or other form of protection in order to prevent STIs. Take aspirin only as told by   your health care provider. Make sure that you understand how much to take and what form to take. Work with your health care provider to find out whether it  is safe and beneficial for you to take aspirin daily. Ask your health care provider if you need to take a cholesterol-lowering medicine (statin). Find healthy ways to manage stress, such as: Meditation, yoga, or listening to music. Journaling. Talking to a trusted person. Spending time with friends and family. Minimize exposure to UV radiation to reduce your risk of skin cancer. Safety Always wear your seat belt while driving or riding in a vehicle. Do not drive: If you have been drinking alcohol. Do not ride with someone who has been drinking. When you are tired or distracted. While texting. If you have been using any mind-altering substances or drugs. Wear a helmet and other protective equipment during sports activities. If you have firearms in your house, make sure you follow all gun safety procedures. What's next? Visit your health care provider once a year for an annual wellness visit. Ask your health care provider how often you should have your eyes and teeth checked. Stay up to date on all vaccines. This information is not intended to replace advice given to you by your health care provider. Make sure you discuss any questions you have with your health care provider. Document Revised: 02/06/2021 Document Reviewed: 02/06/2021 Elsevier Patient Education  2023 [employer]  

## 2022-07-02 LAB — THYROID PANEL WITH TSH
Free Thyroxine Index: 3.2 (ref 1.4–3.8)
T3 Uptake: 29 % (ref 22–35)
T4, Total: 10.9 ug/dL (ref 5.1–11.9)
TSH: 0.74 mIU/L (ref 0.40–4.50)

## 2022-07-10 ENCOUNTER — Encounter: Payer: Self-pay | Admitting: Family Medicine

## 2022-07-15 NOTE — Telephone Encounter (Signed)
Spoke with [facility]. They will be sending a form that needs to completed regarding patient's pain meds

## 2022-07-22 ENCOUNTER — Encounter: Payer: Self-pay | Admitting: Family Medicine

## 2022-08-04 ENCOUNTER — Telehealth: Payer: Self-pay | Admitting: Family Medicine

## 2022-08-04 NOTE — Telephone Encounter (Signed)
Kat with [facility] called to confirm receipt of a form (Good Faith Dispensing Form) regarding therapy for controlled substances. Advised that one of our CMA's tried faxing it again on 07/30/2022. Wendelyn Breslow will send message back to see if they can find form, but at this point they do not show it as received on their end.

## 2022-08-04 NOTE — Telephone Encounter (Signed)
Spoke with [facility] and they stated that she will get in contact with fax team and call back.  We sent paperwork on 12/1 and 12/6 with confirmation.  Spoke with daughter, [name], and she was told that they have tried to call us several times about this.  I advised that that only call that we got was today.  Advised her that I will follow back up with her later today.

## 2022-08-27 ENCOUNTER — Other Ambulatory Visit: Payer: Self-pay | Admitting: Family Medicine

## 2022-08-27 ENCOUNTER — Encounter: Payer: Self-pay | Admitting: Family Medicine

## 2022-08-27 DIAGNOSIS — G4700 Insomnia, unspecified: Secondary | ICD-10-CM

## 2022-08-27 MED ORDER — TEMAZEPAM 7.5 MG PO CAPS: 7.5000 mg | ORAL_CAPSULE | Freq: Every evening | ORAL | 3 refills | Status: DC | PRN

## 2022-10-27 DIAGNOSIS — H353131 Nonexudative age-related macular degeneration, bilateral, early dry stage: Secondary | ICD-10-CM | POA: Diagnosis not present

## 2022-10-27 DIAGNOSIS — H524 Presbyopia: Secondary | ICD-10-CM | POA: Diagnosis not present

## 2022-10-27 DIAGNOSIS — H40013 Open angle with borderline findings, low risk, bilateral: Secondary | ICD-10-CM | POA: Diagnosis not present

## 2022-10-27 DIAGNOSIS — D3131 Benign neoplasm of right choroid: Secondary | ICD-10-CM | POA: Diagnosis not present

## 2022-10-27 DIAGNOSIS — H04123 Dry eye syndrome of bilateral lacrimal glands: Secondary | ICD-10-CM | POA: Diagnosis not present

## 2022-10-30 ENCOUNTER — Encounter: Payer: Self-pay | Admitting: Nurse Practitioner

## 2022-10-30 ENCOUNTER — Ambulatory Visit (INDEPENDENT_AMBULATORY_CARE_PROVIDER_SITE_OTHER): Payer: Medicare Other | Admitting: Nurse Practitioner

## 2022-10-30 VITALS — BP 124/60 | HR 82 | Temp 98.1°F

## 2022-10-30 DIAGNOSIS — L309 Dermatitis, unspecified: Secondary | ICD-10-CM

## 2022-10-30 MED ORDER — CLOTRIMAZOLE-BETAMETHASONE 1-0.05 % EX CREA: 1.0000 | TOPICAL_CREAM | Freq: Every day | CUTANEOUS | 1 refills | Status: DC

## 2022-10-30 NOTE — Progress Notes (Signed)
   Acute Office Visit  Subjective:     Patient ID: [name], female    DOB: 06/19/1928, 89 y.o.   MRN: [id number]  Chief Complaint  Patient presents with   Rash    Started under breast, now on upper chest for 1 month with itching   HPI: Patient is in today for a rash on her chest. It started under both breasts with itching, but that cleared up.   RASH  Duration:   1 month   Location:  chest, neck   Itching: yes Burning: yes Redness: yes Oozing: no Scaling: no Blisters: no Painful: no Fevers: no Change in detergents/soaps/personal care products: no Recent illness: no Recent travel:no History of same: no Context: worse Alleviating factors: hydrocortisone cream Treatments attempted:hydrocortisone cream Shortness of breath: no  Throat/tongue swelling: no Myalgias/arthralgias: no  ROS See pertinent positives and negatives per HPI.      Objective:    BP 124/60 (BP Location: Left Arm)   Pulse 82   Temp 98.1 F (36.7 C)   SpO2 98%    Physical Exam Vitals and nursing note reviewed.  Constitutional:      General: She is not in acute distress.    Appearance: Normal appearance.  HENT:     Head: Normocephalic.  Eyes:     Conjunctiva/sclera: Conjunctivae normal.  Pulmonary:     Effort: Pulmonary effort is normal.  Musculoskeletal:     Cervical back: Normal range of motion.  Skin:    General: Skin is warm.     Findings: Rash (chest, shoulder, neck, papular rash) present.  Neurological:     General: No focal deficit present.     Mental Status: She is alert and oriented to person, place, and time.  Psychiatric:        Mood and Affect: Mood normal.        Behavior: Behavior normal.        Thought Content: Thought content normal.        Judgment: Judgment normal.      Assessment & Plan:   Problem List Items Addressed This Visit   None Visit Diagnoses     Dermatitis    -  Primary   Most likely contact dermatitis, although with starting under  breast possible yeast. Start lotrisone cream daily. Keep area clean and dry. F/U if not improving.       Meds ordered this encounter  Medications   clotrimazole-betamethasone (LOTRISONE) cream    Sig: Apply 1 Application topically daily.    Dispense:  30 g    Refill:  1    Return if symptoms worsen or fail to improve.  Charyl Dancer, NP

## 2022-10-30 NOTE — Patient Instructions (Signed)
It was great to see you!  Start lotrisone cream once a day. Keep the area clean and dry.   Let's follow-up if your symptoms worsen or don't improve.   Take care,  Vance Peper, NP

## 2023-01-01 ENCOUNTER — Telehealth: Payer: Self-pay

## 2023-01-01 ENCOUNTER — Telehealth: Payer: Self-pay | Admitting: Family Medicine

## 2023-01-01 ENCOUNTER — Other Ambulatory Visit: Payer: Self-pay | Admitting: Family Medicine

## 2023-01-01 DIAGNOSIS — R110 Nausea: Secondary | ICD-10-CM

## 2023-01-01 NOTE — Telephone Encounter (Signed)
Megan from [facility] called about pt's promethazine being denied. They will fax denial reason and appeals rights over. Aundra Millet said she spoke to pt and she wants to cancel the prescription for now and will discuss another medication with her when she comes in on Monday

## 2023-01-01 NOTE — Telephone Encounter (Signed)
PA Denied.   We denied this request under Medicare Part D because Promethazine is not being prescribed for an FDA labeled or medically accepted use. A medically accepted use is approved by the FDA or supported by the [facility] Formulary Service Drug Information and the DRUGDEX Information System. In this case, Promethazine is not being prescribed in accordance with an FDA labeled use or use accepted by the Medicare approved drug compendia

## 2023-01-01 NOTE — Telephone Encounter (Signed)
noted 

## 2023-01-01 NOTE — Telephone Encounter (Signed)
PA initiated via Covermymeds; KEY: [id number]. Awaiting determination.

## 2023-01-02 NOTE — Telephone Encounter (Signed)
Spoke with patient's daughter. She advised patient is coming in next week for CPE and will discuss then

## 2023-01-05 ENCOUNTER — Ambulatory Visit (INDEPENDENT_AMBULATORY_CARE_PROVIDER_SITE_OTHER): Payer: Medicare Other | Admitting: Family Medicine

## 2023-01-05 ENCOUNTER — Encounter: Payer: Self-pay | Admitting: Family Medicine

## 2023-01-05 VITALS — BP 128/60 | HR 89 | Temp 98.5°F | Resp 16 | Ht 62.5 in | Wt 87.0 lb

## 2023-01-05 DIAGNOSIS — N1832 Chronic kidney disease, stage 3b: Secondary | ICD-10-CM | POA: Diagnosis not present

## 2023-01-05 DIAGNOSIS — E785 Hyperlipidemia, unspecified: Secondary | ICD-10-CM

## 2023-01-05 DIAGNOSIS — G4700 Insomnia, unspecified: Secondary | ICD-10-CM

## 2023-01-05 DIAGNOSIS — E039 Hypothyroidism, unspecified: Secondary | ICD-10-CM | POA: Diagnosis not present

## 2023-01-05 DIAGNOSIS — R110 Nausea: Secondary | ICD-10-CM | POA: Diagnosis not present

## 2023-01-05 LAB — CBC WITH DIFFERENTIAL/PLATELET
Basophils Absolute: 0 K/uL (ref 0.0–0.1)
Basophils Relative: 0.5 % (ref 0.0–3.0)
Eosinophils Absolute: 0 K/uL (ref 0.0–0.7)
Eosinophils Relative: 0.2 % (ref 0.0–5.0)
HCT: 35 % — ABNORMAL LOW (ref 36.0–46.0)
Hemoglobin: 11.9 g/dL — ABNORMAL LOW (ref 12.0–15.0)
Lymphocytes Relative: 18.8 % (ref 12.0–46.0)
Lymphs Abs: 1.1 K/uL (ref 0.7–4.0)
MCHC: 33.9 g/dL (ref 30.0–36.0)
MCV: 91.4 fl (ref 78.0–100.0)
Monocytes Absolute: 0.5 K/uL (ref 0.1–1.0)
Monocytes Relative: 8.5 % (ref 3.0–12.0)
Neutro Abs: 4.3 K/uL (ref 1.4–7.7)
Neutrophils Relative %: 72 % (ref 43.0–77.0)
Platelets: 353 K/uL (ref 150.0–400.0)
RBC: 3.83 Mil/uL — ABNORMAL LOW (ref 3.87–5.11)
RDW: 13 % (ref 11.5–15.5)
WBC: 6 K/uL (ref 4.0–10.5)

## 2023-01-05 LAB — COMPREHENSIVE METABOLIC PANEL
ALT: 24 U/L (ref 0–35)
AST: 33 U/L (ref 0–37)
Albumin: 4 g/dL (ref 3.5–5.2)
Alkaline Phosphatase: 71 U/L (ref 39–117)
BUN: 11 mg/dL (ref 6–23)
CO2: 29 mEq/L (ref 19–32)
Calcium: 9.8 mg/dL (ref 8.4–10.5)
Chloride: 95 mEq/L — ABNORMAL LOW (ref 96–112)
Creatinine, Ser: 1.11 mg/dL (ref 0.40–1.20)
GFR: 43.09 mL/min — ABNORMAL LOW (ref >60.00)
Glucose, Bld: 90 mg/dL (ref 70–99)
Potassium: 4 mEq/L (ref 3.5–5.1)
Sodium: 136 mEq/L (ref 135–145)
Total Bilirubin: 0.5 mg/dL (ref 0.2–1.2)
Total Protein: 6.6 g/dL (ref 6.0–8.3)

## 2023-01-05 LAB — LIPID PANEL
Cholesterol: 170 mg/dL (ref 0–200)
HDL: 71.8 mg/dL (ref >39.00)
LDL Cholesterol: 84 mg/dL (ref 0–99)
NonHDL: 97.96
Total CHOL/HDL Ratio: 2
Triglycerides: 68 mg/dL (ref 0.0–149.0)
VLDL: 13.6 mg/dL (ref 0.0–40.0)

## 2023-01-05 LAB — TSH: TSH: 1.15 u[IU]/mL (ref 0.35–5.50)

## 2023-01-05 MED ORDER — PROMETHAZINE HCL 25 MG PO TABS
25.0000 mg | ORAL_TABLET | Freq: Two times a day (BID) | ORAL | 1 refills | Status: DC
Start: 2023-01-05 — End: 2023-03-16

## 2023-01-05 MED ORDER — SODIUM CHLORIDE 0.9 % IV SOLN: 25.0000 mg | Freq: Two times a day (BID) | INTRAVENOUS | 1 refills | Status: DC | PRN

## 2023-01-05 NOTE — Assessment & Plan Note (Signed)
Cholesterol--- LDL goal < 100,  HDL >40,  TG < 150.  Diet and exercise will increase HDL and decrease LDL and TG.  Fish,  Fish Oil, Flaxseed oil will also help increase the HDL and decrease Triglycerides.   Recheck labs in 6 months.  

## 2023-01-05 NOTE — Assessment & Plan Note (Signed)
On trazadone 

## 2023-01-05 NOTE — Assessment & Plan Note (Signed)
Check labs Cont synthroid 

## 2023-01-05 NOTE — Progress Notes (Signed)
Established Patient Office Visit  Subjective   Patient ID: [name], female    DOB: 06/22/1928  Age: 89 y.o. MRN: [id number]  Chief Complaint  Patient presents with   Hypothyroidism   Hyperlipidemia   Follow-up    HPI Pt  here with her daughter to f/u thyroid and cholesterol.  No new complaints.  She is still not eating well and will not drink ensure/ boost but she loves candy.   Patient Active Problem List   Diagnosis Date Noted   Chronic obstructive pulmonary disease, unspecified COPD type (HCC) 02/26/2021   PVD (peripheral vascular disease) (HCC) 02/26/2021   Severe protein-calorie malnutrition (HCC) 11/26/2020   Preventative health care 11/08/2018   Open wound of left lower leg 07/15/2018   Fall 07/10/2018   Wound of left leg 07/10/2018   Wound of right leg, initial encounter 07/10/2018   Pancreatic mass    Osteoarthritis of multiple joints 11/05/2017   Atherosclerosis of native coronary artery of native heart without angina pectoris 11/05/2017   Right renal mass    Suspicious mole 06/06/2013   Allergic drug reaction 07/23/2012   RLS (restless legs syndrome) 05/31/2012   Insomnia 05/26/2011   Osteoporosis 05/26/2011   Hypothyroidism 05/10/2008   HYPERCALCEMIA 03/16/2008   UTI 03/06/2008   Weakness generalized 03/06/2008   Hyperlipidemia 04/12/2007   ARTIFICIAL MENOPAUSE 04/12/2007   Anxiety state 01/12/2007   DEPRESSION 01/12/2007   MYOCARDIAL INFARCTION, HX OF 01/12/2007   Past Medical History:  Diagnosis Date   Allergy    Anemia    Anxiety    Cataract 05/2011   Chronic low blood pressure    due to dehydration   Depression    Esophageal disorder    Esophageal yeast infection (HCC)    GERD (gastroesophageal reflux disease)    Hearing loss    hearing aids   Hyperlipidemia    Hypothyroidism    Myocardial infarction (HCC)    Osteoporosis    Renal cell carcinoma (HCC)    found on CT   Thyroid disease    hypothyroidism   Transfusion history     last 2004   Past Surgical History:  Procedure Laterality Date   APPENDECTOMY     BLEPHAROPLASTY Right    x 3 (all related to basal cell carcinoma)   COLONOSCOPY     CORONARY ARTERY BYPASS GRAFT     x2 vessel bypass -Dr. Excell Seltzer -[facility] 06-22-14   EUS N/A 02/04/2018   Procedure: UPPER ENDOSCOPIC ULTRASOUND (EUS) RADIAL;  Surgeon: Rachael Fee, MD;  Location: [facility];  Service: Endoscopy;  Laterality: N/A;   FINE NEEDLE ASPIRATION N/A 02/04/2018   Procedure: FINE NEEDLE ASPIRATION (FNA) LINEAR;  Surgeon: Rachael Fee, MD;  Location: [facility];  Service: Endoscopy;  Laterality: N/A;   IR GENERIC HISTORICAL  08/29/2014   IR RADIOLOGIST EVAL & MGMT 08/29/2014 Irish Lack, MD [facility]   IR GENERIC HISTORICAL  10/22/2016   IR RADIOLOGIST EVAL & MGMT 10/22/2016 Irish Lack, MD [facility]   IR RADIOLOGIST EVAL & MGMT  11/03/2017   IR RADIOLOGIST EVAL & MGMT  02/15/2019   KYPHOSIS SURGERY     obstructed ureter Left    repair-operative   RENAL CRYOABLATION Right 09/2014   TONSILLECTOMY     TOTAL ABDOMINAL HYSTERECTOMY W/ BILATERAL SALPINGOOPHORECTOMY     Social History   Tobacco Use   Smoking status: Former   Smokeless tobacco: Never  Substance Use Topics   Alcohol use:  No   Drug use: No   Social History   Socioeconomic History   Marital status: Widowed    Spouse name: Not on file   Number of children: 2   Years of education: Not on file   Highest education level: 12th grade  Occupational History   Occupation: Retired    Associate Professor: RETIRED  Tobacco Use   Smoking status: Former   Smokeless tobacco: Never  Substance and Sexual Activity   Alcohol use: No   Drug use: No   Sexual activity: Not Currently  Other Topics Concern   Not on file  Social History Narrative   REG EXERCISE   WIDOW   LIVES ALONE   END OF LIFE:PATIENT HAS LIVING WILL AND CLEARLY STATES SHE DOES NOT WANT CARDIAC RESUSCITATION,MECHANICLA VENTILATION OR OTHER HEROIC OR  FUTILE MEASURES.   Social Determinants of Health   Financial Resource Strain: Low Risk  (01/02/2023)   Overall Financial Resource Strain (CARDIA)    Difficulty of Paying Living Expenses: Not hard at all  Food Insecurity: No Food Insecurity (01/02/2023)   Hunger Vital Sign    Worried About Running Out of Food in the Last Year: Never true    Ran Out of Food in the Last Year: Never true  Transportation Needs: No Transportation Needs (01/02/2023)   PRAPARE - Administrator, Civil Service (Medical): No    Lack of Transportation (Non-Medical): No  Physical Activity: Sufficiently Active (01/02/2023)   Exercise Vital Sign    Days of Exercise per Week: 5 days    Minutes of Exercise per Session: 40 min  Stress: No Stress Concern Present (01/02/2023)   [employer] of Occupational Health - Occupational Stress Questionnaire    Feeling of Stress : Only a little  Social Connections: Moderately Integrated (01/02/2023)   Social Connection and Isolation Panel [NHANES]    Frequency of Communication with Friends and Family: More than three times a week    Frequency of Social Gatherings with Friends and Family: More than three times a week    Attends Religious Services: More than 4 times per year    Active Member of [employer] or Organizations: Yes    Attends Banker Meetings: More than 4 times per year    Marital Status: Widowed  Intimate Partner Violence: Not At Risk (03/11/2022)   Humiliation, Afraid, Rape, and Kick questionnaire    Fear of Current or Ex-Partner: No    Emotionally Abused: No    Physically Abused: No    Sexually Abused: No   Family Status  Relation Name Status   Father  Deceased   Other fam hx of Alive   Mother  Deceased   MGM  Deceased   MGF  Deceased   PGM  Deceased   PGF  Deceased   Sister  (Not Specified)   Other neice Alive   Neg Hx  (Not Specified)   Family History  Problem Relation Age of Onset   Parkinsonism Father    Hypertension Other     Heart disease Other        CAD.Marland Kitchen1ST DEGREE RELATIVE <60   Hyperlipidemia Other    Breast cancer Sister    Breast cancer Other    Colon cancer Neg Hx    Esophageal cancer Neg Hx    Rectal cancer Neg Hx    Stomach cancer Neg Hx    Allergies  Allergen Reactions   Zofran [Ondansetron Hcl] Anaphylaxis and Other (See Comments)  Tongue swelling, lost of voice   Clarithromycin     Pt does not remember the reaction   Codeine     Per the pt, "It made me disoriented"   Diazepam Other (See Comments)    Per the pt, "I don't remember the reaction"   Sulfonamide Derivatives     Per the pt, "It made me disoriented"   Prilosec [Omeprazole Magnesium] Rash   Trazodone Anxiety      Review of Systems  Constitutional:  Negative for fever and malaise/fatigue.  HENT:  Negative for congestion.   Eyes:  Negative for blurred vision.  Respiratory:  Negative for cough and shortness of breath.   Cardiovascular:  Negative for chest pain, palpitations and leg swelling.  Gastrointestinal:  Negative for abdominal pain, blood in stool, nausea and vomiting.  Genitourinary:  Negative for dysuria and frequency.  Musculoskeletal:  Negative for back pain and falls.  Skin:  Negative for rash.  Neurological:  Negative for dizziness, loss of consciousness and headaches.  Endo/Heme/Allergies:  Negative for environmental allergies.  Psychiatric/Behavioral:  Negative for depression. The patient is not nervous/anxious.       Objective:     BP 128/60 (BP Location: Right Arm, Patient Position: Sitting, Cuff Size: Small)   Pulse 89   Temp 98.5 F (36.9 C) (Oral)   Resp 16   Ht 5' 2.5" (1.588 m)   Wt 87 lb (39.5 kg)   SpO2 99%   BMI 15.66 kg/m  BP Readings from Last 3 Encounters:  01/05/23 128/60  10/30/22 124/60  07/01/22 120/60   Wt Readings from Last 3 Encounters:  01/05/23 87 lb (39.5 kg)  07/01/22 82 lb 6.4 oz (37.4 kg)  12/24/21 80 lb (36.3 kg)   SpO2 Readings from Last 3 Encounters:   01/05/23 99%  10/30/22 98%  02/26/21 98%      Physical Exam Vitals and nursing note reviewed.  Constitutional:      Appearance: She is well-developed and underweight.  HENT:     Head: Normocephalic and atraumatic.  Eyes:     Conjunctiva/sclera: Conjunctivae normal.  Neck:     Thyroid: No thyromegaly.     Vascular: No carotid bruit or JVD.  Cardiovascular:     Rate and Rhythm: Normal rate and regular rhythm.     Heart sounds: Normal heart sounds. No murmur heard. Pulmonary:     Effort: Pulmonary effort is normal. No respiratory distress.     Breath sounds: Normal breath sounds. No wheezing or rales.  Chest:     Chest wall: No tenderness.  Musculoskeletal:     Cervical back: Normal range of motion and neck supple.  Neurological:     General: No focal deficit present.     Mental Status: She is alert and oriented to person, place, and time.  Psychiatric:        Mood and Affect: Mood normal.        Behavior: Behavior is cooperative.        Thought Content: Thought content normal.      No results found for any visits on 01/05/23.  Last CBC Lab Results  Component Value Date   WBC 4.9 07/01/2022   HGB 12.4 07/01/2022   HCT 37.3 07/01/2022   MCV 92.3 07/01/2022   MCH 30.7 11/04/2020   RDW 12.5 07/01/2022   PLT 285.0 07/01/2022   Last metabolic panel Lab Results  Component Value Date   GLUCOSE 91 07/01/2022   NA 132 (L) 07/01/2022  K 4.1 07/01/2022   CL 96 07/01/2022   CO2 27 07/01/2022   BUN 13 07/01/2022   CREATININE 1.25 (H) 07/01/2022   GFRNONAA 58 (L) 11/04/2020   CALCIUM 10.0 07/01/2022   PROT 6.5 07/01/2022   ALBUMIN 4.2 07/01/2022   BILITOT 0.4 07/01/2022   ALKPHOS 62 07/01/2022   AST 35 07/01/2022   ALT 25 07/01/2022   ANIONGAP 9 11/04/2020   Last lipids Lab Results  Component Value Date   CHOL 153 07/01/2022   HDL 72.30 07/01/2022   LDLCALC 68 07/01/2022   LDLDIRECT 165.1 05/10/2008   TRIG 67.0 07/01/2022   CHOLHDL 2 07/01/2022    Last hemoglobin A1c Lab Results  Component Value Date   HGBA1C 5.8 10/27/2013   Last thyroid functions Lab Results  Component Value Date   TSH 0.74 07/01/2022   T4TOTAL 10.9 07/01/2022   Last vitamin D Lab Results  Component Value Date   VD25OH 86.48 07/01/2022   Last vitamin B12 and Folate Lab Results  Component Value Date   VITAMINB12 >1500 (H) 07/01/2022   FOLATE >20.0 ng/mL 05/22/2010      The ASCVD Risk score (Arnett DK, et al., 2019) failed to calculate for the following reasons:   The 2019 ASCVD risk score is only valid for ages 23 to 58   The patient has a prior MI or stroke diagnosis    Assessment & Plan:   Problem List Items Addressed This Visit       Unprioritized   Insomnia    On trazadone       Hypothyroidism    Check labs  Con't synthroid       Relevant Orders   Lipid panel   TSH   Comprehensive metabolic panel   Hyperlipidemia - Primary    Cholesterol--- LDL goal < 100,  HDL >40,  TG < 150.  Diet and exercise will increase HDL and decrease LDL and TG.  Fish,  Fish Oil, Flaxseed oil will also help increase the HDL and decrease Triglycerides.   Recheck labs in 6 months .        Relevant Orders   Lipid panel   TSH   Comprehensive metabolic panel   Other Visit Diagnoses     Nausea       Relevant Medications   promethazine (PHENERGAN) 25 MG tablet   Chronic renal failure, stage 3b (HCC)       Relevant Orders   CBC with Differential/Platelet       Return in about 6 months (around 07/08/2023) for annual exam, fasting.    Donato Schultz, DO

## 2023-02-05 ENCOUNTER — Telehealth: Payer: Self-pay | Admitting: Family Medicine

## 2023-02-05 NOTE — Telephone Encounter (Signed)
Alesha with [employer] said that daughter filed appeal for patient's promethazine (PHENERGAN) 25 MG tablet  but they need clinical information/notes and a supporting statement from provider as to purpose for medication. It's usually prescribed to cancer patients who are nauseous from chemo treatment. Please fax to  Attn: Bernerd Limbo at [phone]

## 2023-02-09 NOTE — Telephone Encounter (Signed)
Okay to provide letter

## 2023-02-10 NOTE — Telephone Encounter (Signed)
Letter faxed.

## 2023-02-10 NOTE — Telephone Encounter (Signed)
[name] has called again for a follow up and wanted to inform us that today is the last day

## 2023-02-10 NOTE — Telephone Encounter (Signed)
Alesha ([employer]) called to inquire into the following regarding pt's appeal for promethazine:  -Requesting clinical info regarding meds -Does pt have diagnosis of motion sickness or allergic rhinitis? -Have the risk factors been discussed with the pt? -Do the benefits outweigh the risks?  Alesha stated that today (6.18.24) is the deadline for this appeal and needs a callback ASAP. She can be reached at the following number:  [phone]

## 2023-02-24 DIAGNOSIS — M545 Low back pain, unspecified: Secondary | ICD-10-CM | POA: Diagnosis not present

## 2023-02-24 DIAGNOSIS — Z87891 Personal history of nicotine dependence: Secondary | ICD-10-CM | POA: Diagnosis not present

## 2023-02-24 DIAGNOSIS — R32 Unspecified urinary incontinence: Secondary | ICD-10-CM | POA: Diagnosis not present

## 2023-02-24 DIAGNOSIS — Z681 Body mass index (BMI) 19 or less, adult: Secondary | ICD-10-CM | POA: Diagnosis not present

## 2023-02-24 DIAGNOSIS — E039 Hypothyroidism, unspecified: Secondary | ICD-10-CM | POA: Diagnosis not present

## 2023-02-24 DIAGNOSIS — Z809 Family history of malignant neoplasm, unspecified: Secondary | ICD-10-CM | POA: Diagnosis not present

## 2023-02-24 DIAGNOSIS — E785 Hyperlipidemia, unspecified: Secondary | ICD-10-CM | POA: Diagnosis not present

## 2023-02-24 DIAGNOSIS — I10 Essential (primary) hypertension: Secondary | ICD-10-CM | POA: Diagnosis not present

## 2023-03-16 ENCOUNTER — Ambulatory Visit (INDEPENDENT_AMBULATORY_CARE_PROVIDER_SITE_OTHER): Payer: Medicare Other | Admitting: *Deleted

## 2023-03-16 DIAGNOSIS — K080 Exfoliation of teeth due to systemic causes: Secondary | ICD-10-CM | POA: Diagnosis not present

## 2023-03-16 DIAGNOSIS — Z0000 Encounter for general adult medical examination without abnormal findings: Secondary | ICD-10-CM | POA: Diagnosis not present

## 2023-03-16 NOTE — Patient Instructions (Signed)
[name] , Thank you for taking time to come for your Medicare Wellness Visit. I appreciate your ongoing commitment to your health goals. Please review the following plan we discussed and let me know if I can assist you in the future.    This is a list of the screening recommended for you and due dates:  Health Maintenance  Topic Date Due   Zoster (Shingles) Vaccine (1 of 2) Never done   COVID-19 Vaccine (6 - 2023-24 season) 04/25/2022   Flu Shot  03/26/2023   Medicare Annual Wellness Visit  03/15/2024   DTaP/Tdap/Td vaccine (2 - Td or Tdap) 06/08/2026   Pneumonia Vaccine  Completed   DEXA scan (bone density measurement)  Completed   HPV Vaccine  Aged Out    Next appointment: Follow up in one year for your annual wellness visit.   Preventive Care 51 Years and Older, Female Preventive care refers to lifestyle choices and visits with your health care provider that can promote health and wellness. What does preventive care include? A yearly physical exam. This is also called an annual well check. Dental exams once or twice a year. Routine eye exams. Ask your health care provider how often you should have your eyes checked. Personal lifestyle choices, including: Daily care of your teeth and gums. Regular physical activity. Eating a healthy diet. Avoiding tobacco and drug use. Limiting alcohol use. Practicing safe sex. Taking low-dose aspirin every day. Taking vitamin and mineral supplements as recommended by your health care provider. What happens during an annual well check? The services and screenings done by your health care provider during your annual well check will depend on your age, overall health, lifestyle risk factors, and family history of disease. Counseling  Your health care provider may ask you questions about your: Alcohol use. Tobacco use. Drug use. Emotional well-being. Home and relationship well-being. Sexual activity. Eating habits. History of  falls. Memory and ability to understand (cognition). Work and work Astronomer. Reproductive health. Screening  You may have the following tests or measurements: Height, weight, and BMI. Blood pressure. Lipid and cholesterol levels. These may be checked every 5 years, or more frequently if you are over 106 years old. Skin check. Lung cancer screening. You may have this screening every year starting at age 69 if you have a 30-pack-year history of smoking and currently smoke or have quit within the past 15 years. Fecal occult blood test (FOBT) of the stool. You may have this test every year starting at age 66. Flexible sigmoidoscopy or colonoscopy. You may have a sigmoidoscopy every 5 years or a colonoscopy every 10 years starting at age 71. Hepatitis C blood test. Hepatitis B blood test. Sexually transmitted disease (STD) testing. Diabetes screening. This is done by checking your blood sugar (glucose) after you have not eaten for a while (fasting). You may have this done every 1-3 years. Bone density scan. This is done to screen for osteoporosis. You may have this done starting at age 62. Mammogram. This may be done every 1-2 years. Talk to your health care provider about how often you should have regular mammograms. Talk with your health care provider about your test results, treatment options, and if necessary, the need for more tests. Vaccines  Your health care provider may recommend certain vaccines, such as: Influenza vaccine. This is recommended every year. Tetanus, diphtheria, and acellular pertussis (Tdap, Td) vaccine. You may need a Td booster every 10 years. Zoster vaccine. You may need this after age  60. Pneumococcal 13-valent conjugate (PCV13) vaccine. One dose is recommended after age 22. Pneumococcal polysaccharide (PPSV23) vaccine. One dose is recommended after age 8. Talk to your health care provider about which screenings and vaccines you need and how often you need  them. This information is not intended to replace advice given to you by your health care provider. Make sure you discuss any questions you have with your health care provider. Document Released: 09/07/2015 Document Revised: 04/30/2016 Document Reviewed: 06/12/2015 Elsevier Interactive Patient Education  2017 [employer].  Fall Prevention in the Home Falls can cause injuries. They can happen to people of all ages. There are many things you can do to make your home safe and to help prevent falls. What can I do on the outside of my home? Regularly fix the edges of walkways and driveways and fix any cracks. Remove anything that might make you trip as you walk through a door, such as a raised step or threshold. Trim any bushes or trees on the path to your home. Use bright outdoor lighting. Clear any walking paths of anything that might make someone trip, such as rocks or tools. Regularly check to see if handrails are loose or broken. Make sure that both sides of any steps have handrails. Any raised decks and porches should have guardrails on the edges. Have any leaves, snow, or ice cleared regularly. Use sand or salt on walking paths during winter. Clean up any spills in your garage right away. This includes oil or grease spills. What can I do in the bathroom? Use night lights. Install grab bars by the toilet and in the tub and shower. Do not use towel bars as grab bars. Use non-skid mats or decals in the tub or shower. If you need to sit down in the shower, use a plastic, non-slip stool. Keep the floor dry. Clean up any water that spills on the floor as soon as it happens. Remove soap buildup in the tub or shower regularly. Attach bath mats securely with double-sided non-slip rug tape. Do not have throw rugs and other things on the floor that can make you trip. What can I do in the bedroom? Use night lights. Make sure that you have a light by your bed that is easy to reach. Do not use  any sheets or blankets that are too big for your bed. They should not hang down onto the floor. Have a firm chair that has side arms. You can use this for support while you get dressed. Do not have throw rugs and other things on the floor that can make you trip. What can I do in the kitchen? Clean up any spills right away. Avoid walking on wet floors. Keep items that you use a lot in easy-to-reach places. If you need to reach something above you, use a strong step stool that has a grab bar. Keep electrical cords out of the way. Do not use floor polish or wax that makes floors slippery. If you must use wax, use non-skid floor wax. Do not have throw rugs and other things on the floor that can make you trip. What can I do with my stairs? Do not leave any items on the stairs. Make sure that there are handrails on both sides of the stairs and use them. Fix handrails that are broken or loose. Make sure that handrails are as long as the stairways. Check any carpeting to make sure that it is firmly attached to the stairs. Fix  any carpet that is loose or worn. Avoid having throw rugs at the top or bottom of the stairs. If you do have throw rugs, attach them to the floor with carpet tape. Make sure that you have a light switch at the top of the stairs and the bottom of the stairs. If you do not have them, ask someone to add them for you. What else can I do to help prevent falls? Wear shoes that: Do not have high heels. Have rubber bottoms. Are comfortable and fit you well. Are closed at the toe. Do not wear sandals. If you use a stepladder: Make sure that it is fully opened. Do not climb a closed stepladder. Make sure that both sides of the stepladder are locked into place. Ask someone to hold it for you, if possible. Clearly mark and make sure that you can see: Any grab bars or handrails. First and last steps. Where the edge of each step is. Use tools that help you move around (mobility aids)  if they are needed. These include: Canes. Walkers. Scooters. Crutches. Turn on the lights when you go into a dark area. Replace any light bulbs as soon as they burn out. Set up your furniture so you have a clear path. Avoid moving your furniture around. If any of your floors are uneven, fix them. If there are any pets around you, be aware of where they are. Review your medicines with your doctor. Some medicines can make you feel dizzy. This can increase your chance of falling. Ask your doctor what other things that you can do to help prevent falls. This information is not intended to replace advice given to you by your health care provider. Make sure you discuss any questions you have with your health care provider. Document Released: 06/07/2009 Document Revised: 01/17/2016 Document Reviewed: 09/15/2014 Elsevier Interactive Patient Education  2017 [employer].

## 2023-03-16 NOTE — Progress Notes (Signed)
Subjective:   [name] is a 89 y.o. female who presents for Medicare Annual (Subsequent) preventive examination.  Visit Complete: Virtual  I connected with  [name] on 03/16/23 by a audio enabled telemedicine application and verified that I am speaking with the correct person using two identifiers.  Patient Location: Home  Provider Location: Office/Clinic  I discussed the limitations of evaluation and management by telemedicine. The patient expressed understanding and agreed to proceed.  Patient Medicare AWV questionnaire was completed by the patient on 03/12/23; I have confirmed that all information answered by patient is correct and no changes since this date.  Review of Systems     Cardiac Risk Factors include: advanced age (>75men, >71 women);dyslipidemia     Objective:    Per patient no change in vitals since last visit, unable to obtain new vitals due to telehealth visit     03/16/2023    9:10 AM 03/11/2022    9:04 AM 03/05/2021    8:26 AM 05/12/2019    9:12 AM 07/07/2018    8:40 AM 02/04/2018    6:20 AM 11/05/2017    9:26 AM  Advanced Directives  Does Patient Have a Medical Advance Directive? Yes Yes Yes Yes No Yes Yes  Type of Estate agent of [place];Living will;Out of facility DNR (pink MOST or yellow form) Healthcare Power of [place];Living will Healthcare Power of [place];Living will Healthcare Power of [place];Living will  Healthcare Power of [place];Living will Healthcare Power of [place];Living will  Does patient want to make changes to medical advance directive? No - Patient declined   No - Patient declined     Copy of Healthcare Power of Attorney in Chart? No - copy requested No - copy requested Yes - validated most recent copy scanned in chart (See row information) Yes - validated most recent copy scanned in chart (See row information)  Yes Yes  Would patient like information on creating a medical advance directive?     Yes  (MAU/Ambulatory/Procedural Areas - Information given)      Current Medications (verified) Outpatient Encounter Medications as of 03/16/2023  Medication Sig   ALPRAZolam (XANAX) 0.5 MG tablet Take 1 tablet (0.5 mg total) by mouth 2 (two) times daily as needed. for anxiety   aspirin 81 MG EC tablet Take 81 mg by mouth daily.   Cholecalciferol (VITAMIN D3) 25 MCG (1000 UT) CAPS Take 1 capsule (1,000 Units total) by mouth daily.   clotrimazole-betamethasone (LOTRISONE) cream Apply 1 Application topically daily.   ferrous fumarate (HEMOCYTE - 106 MG FE) 325 (106 Fe) MG TABS tablet Take 3 tablets (318 mg of iron total) by mouth daily.   fluconazole (DIFLUCAN) 100 MG tablet TAKE 1 TABLET BY MOUTH  DAILY AS NEEDED FOR YEAST  INFECTION (Patient not taking: Reported on 10/30/2022)   fluticasone (FLONASE) 50 MCG/ACT nasal spray Place 2 sprays into both nostrils daily as needed for allergies or rhinitis.   folic acid (FOLVITE) 1 MG tablet Take 1 tablet (1 mg total) by mouth daily.   furosemide (LASIX) 40 MG tablet Take 1 tablet (40 mg total) by mouth daily as needed.   hydroxypropyl methylcellulose / hypromellose (ISOPTO TEARS / GONIOVISC) 2.5 % ophthalmic solution Place 1 drop into both eyes daily as needed for dry eyes.   ibuprofen (ADVIL) 200 MG tablet Take 400 mg by mouth every 6 (six) hours as needed for mild pain.   levothyroxine (SYNTHROID) 50 MCG tablet Take 1 tablet (50 mcg total)  by mouth daily.   Multiple Vitamin (MULTIVITAMIN WITH MINERALS) TABS tablet Take 1 tablet by mouth daily.   oxymetazoline (AFRIN) 0.05 % nasal spray Place 1 spray into both nostrils daily as needed for congestion.   potassium chloride SA (KLOR-CON M) 20 MEQ tablet Take 1 tablet (20 mEq total) by mouth 2 (two) times daily.   rOPINIRole (REQUIP) 1 MG tablet Take 1 tablet (1 mg total) by mouth at bedtime.   simvastatin (ZOCOR) 20 MG tablet Take 1 tablet (20 mg total) by mouth daily.   temazepam (RESTORIL) 7.5 MG capsule Take  1 capsule (7.5 mg total) by mouth at bedtime as needed for sleep.   tiZANidine (ZANAFLEX) 2 MG tablet Take 1 tablet (2 mg total) by mouth daily as needed for muscle spasms.   traMADol (ULTRAM) 50 MG tablet Take 1 tablet (50 mg total) by mouth every 6 (six) hours as needed. for pain   [DISCONTINUED] promethazine (PHENERGAN) 25 MG tablet Take 1 tablet (25 mg total) by mouth 2 (two) times daily.   No facility-administered encounter medications on file as of 03/16/2023.    Allergies (verified) Zofran [ondansetron hcl], Clarithromycin, Codeine, Diazepam, Sulfonamide derivatives, Prilosec [omeprazole magnesium], and Trazodone   History: Past Medical History:  Diagnosis Date   Allergy    Anemia    Anxiety    Cataract 05/2011   Chronic low blood pressure    due to dehydration   Depression    Esophageal disorder    Esophageal yeast infection (HCC)    GERD (gastroesophageal reflux disease)    Hearing loss    hearing aids   Hyperlipidemia    Hypothyroidism    Myocardial infarction [facility])    Osteoporosis    Renal cell carcinoma (HCC)    found on CT   Thyroid disease    hypothyroidism   Transfusion history    last 2004   Past Surgical History:  Procedure Laterality Date   ABDOMINAL HYSTERECTOMY     APPENDECTOMY     BLEPHAROPLASTY Right    x 3 (all related to basal cell carcinoma)   COLONOSCOPY     CORONARY ARTERY BYPASS GRAFT     x2 vessel bypass -Dr. Excell Seltzer -[facility] 06-22-14   EUS N/A 02/04/2018   Procedure: UPPER ENDOSCOPIC ULTRASOUND (EUS) RADIAL;  Surgeon: Rachael Fee, MD;  Location: [facility] ENDOSCOPY;  Service: Endoscopy;  Laterality: N/A;   EYE SURGERY     FINE NEEDLE ASPIRATION N/A 02/04/2018   Procedure: FINE NEEDLE ASPIRATION (FNA) LINEAR;  Surgeon: Rachael Fee, MD;  Location: [facility] END[facility]COPY;  Service: Endoscopy;  Laterality: N/A;   FRACTURE SURGERY     IR GENERIC HISTORICAL  08/29/2014   IR RADIOLOGIST EVAL & MGMT 08/29/2014 Irish Lack, MD [facility] INTERV RAD    IR GENERIC HISTORICAL  10/22/2016   IR RADIOLOGIST EVAL & MGMT 10/22/2016 Irish Lack, MD [facility] INTERV RAD   IR RADIOLOGIST EVAL & MGMT  11/03/2017   IR RADIOLOGIST EVAL & MGMT  02/15/2019   KYPHOSIS SURGERY     obstructed ureter Left    repair-operative   RENAL CRYOABLATION Right 09/2014   TONSILLECTOMY     TOTAL ABDOMINAL HYSTERECTOMY W/ BILATERAL SALPINGOOPHORECTOMY     Family History  Problem Relation Age of Onset   Parkinsonism Father    Hypertension Other    Heart disease Other        CAD.Marland Kitchen1ST DEGREE RELATIVE <60   Hyperlipidemia Other    Breast cancer Sister    Breast  cancer Other    Arthritis Daughter    Vision loss Daughter    Early death Daughter    Hypertension Daughter    Colon cancer Neg Hx    Esophageal cancer Neg Hx    Rectal cancer Neg Hx    Stomach cancer Neg Hx    Social History   Socioeconomic History   Marital status: Widowed    Spouse name: Not on file   Number of children: 2   Years of education: Not on file   Highest education level: 12th grade  Occupational History   Occupation: Retired    Associate Professor: RETIRED  Tobacco Use   Smoking status: Former    Current packs/day: 0.00    Average packs/day: 1 pack/day for 4.0 years (4.0 ttl pk-yrs)    Types: Cigarettes    Quit date: 08/25/1958    Years since quitting: 64.6   Smokeless tobacco: Never  Substance and Sexual Activity   Alcohol use: No   Drug use: No   Sexual activity: Not Currently    Birth control/protection: Surgical  Other Topics Concern   Not on file  Social History Narrative   REG EXERCISE   WIDOW   LIVES ALONE   END OF LIFE:PATIENT HAS LIVING WILL AND CLEARLY STATES SHE DOES NOT WANT CARDIAC RESUSCITATION,MECHANICLA VENTILATION OR OTHER HEROIC OR FUTILE MEASURES.   Social Determinants of Health   Financial Resource Strain: Low Risk  (03/12/2023)   Overall Financial Resource Strain (CARDIA)    Difficulty of Paying Living Expenses: Not hard at all  Food Insecurity: No Food  Insecurity (03/12/2023)   Hunger Vital Sign    Worried About Running Out of Food in the Last Year: Never true    Ran Out of Food in the Last Year: Never true  Transportation Needs: No Transportation Needs (03/12/2023)   PRAPARE - Administrator, Civil Service (Medical): No    Lack of Transportation (Non-Medical): No  Physical Activity: Sufficiently Active (03/12/2023)   Exercise Vital Sign    Days of Exercise per Week: 5 days    Minutes of Exercise per Session: 30 min  Stress: No Stress Concern Present (03/12/2023)   [employer] of Occupational Health - Occupational Stress Questionnaire    Feeling of Stress : Only a little  Social Connections: Moderately Isolated (03/12/2023)   Social Connection and Isolation Panel [NHANES]    Frequency of Communication with Friends and Family: More than three times a week    Frequency of Social Gatherings with Friends and Family: More than three times a week    Attends Religious Services: More than 4 times per year    Active Member of [employer] or Organizations: No    Attends Banker Meetings: Never    Marital Status: Widowed    Tobacco Counseling Counseling given: Not Answered   Clinical Intake:  Pre-visit preparation completed: Yes  Pain : No/denies pain  Nutritional Risks: None Diabetes: No  How often do you need to have someone help you when you read instructions, pamphlets, or other written materials from your doctor or pharmacy?: 1 - Never  Interpreter Needed?: No  Information entered by :: Donne Anon, CMA   Activities of Daily Living    03/12/2023    4:00 PM  In your present state of health, do you have any difficulty performing the following activities:  Hearing? 1  Comment totally deaf in left ear, wears hearing aid in right ear  Vision? 0  Difficulty concentrating  or making decisions? 0  Walking or climbing stairs? 1  Dressing or bathing? 0  Doing errands, shopping? 1  Preparing Food and eating  ? N  Using the Toilet? N  In the past six months, have you accidently leaked urine? Y  Do you have problems with loss of bowel control? N  Managing your Medications? N  Managing your Finances? N  Housekeeping or managing your Housekeeping? N    Patient Care Team: Zola Button, Grayling Congress, DO as PCP - General Rachael Fee, MD as Consulting Physician (Gastroenterology) Irish Lack, MD as Consulting Physician (Interventional Radiology) [place], Amy, MD as Consulting Physician (Dermatology) Tonny Bollman, MD as Consulting Physician (Cardiology) Mateo Flow, MD as Consulting Physician (Ophthalmology)  Indicate any recent Medical Services you may have received from other than [facility] providers in the past year (date may be approximate).     Assessment:   This is a routine wellness examination for [place].  Hearing/Vision screen No results found.  Dietary issues and exercise activities discussed:     Goals Addressed   None    Depression Screen    03/16/2023    9:11 AM 01/05/2023    8:30 AM 03/11/2022    9:05 AM 03/05/2021    8:28 AM 05/12/2019    9:12 AM 11/05/2017    9:26 AM 10/23/2016    8:05 AM  PHQ 2/9 Scores  PHQ - 2 Score 0 1 0 0 0 0 0    Fall Risk    03/12/2023    4:00 PM 10/30/2022    2:23 PM 03/11/2022    9:05 AM 03/05/2021    8:27 AM 06/21/2020    9:51 AM  Fall Risk   Falls in the past year? 1 0 0 0 0  Number falls in past yr: 0 0 0 0 0  Injury with Fall? 1 0 0 0 0  Risk for fall due to : History of fall(s) No Fall Risks History of fall(s)    Follow up Falls evaluation completed Falls evaluation completed Falls evaluation completed Falls prevention discussed Falls evaluation completed    MEDICARE RISK AT HOME:   TIMED UP AND GO:  Was the test performed?  No    Cognitive Function:    11/05/2017    9:30 AM 10/23/2016    8:08 AM  MMSE - Mini Mental State Exam  Orientation to time 5 5  Orientation to Place 5 5  Registration 3 3  Attention/ Calculation  5 5  Recall 3 3  Language- name 2 objects 2 2  Language- repeat 1 1  Language- follow 3 step command 3 3  Language- read & follow direction 1 1  Write a sentence 1 1  Copy design 1 1  Total score 30 30        03/16/2023    9:13 AM 03/11/2022    9:10 AM 05/12/2019    9:21 AM  6CIT Screen  What Year? 0 points 0 points 0 points  What month? 0 points 0 points 0 points  What time? 0 points 0 points 0 points  Count back from 20 0 points 0 points 0 points  Months in reverse 0 points 0 points 0 points  Repeat phrase 0 points 0 points 0 points  Total Score 0 points 0 points 0 points    Immunizations Immunization History  Administered Date(s) Administered   Fluad Quad(high Dose 65+) 06/16/2021, 07/01/2022   Influenza Split 05/26/2011, 05/31/2012   Influenza Whole 05/31/2008,  05/17/2009, 05/22/2010   Influenza, High Dose Seasonal PF 05/21/2014, 04/30/2015, 05/18/2016, 05/09/2018, 04/22/2019   Influenza,inj,Quad PF,6+ Mos 06/06/2013   Influenza-Unspecified 04/09/2017, 05/09/2018, 05/21/2020   PFIZER(Purple Top)SARS-COV-2 Vaccination 10/01/2019, 10/26/2019, 05/21/2020, 02/13/2021   Pfizer Covid-19 Vaccine Bivalent Booster 37yrs & up 06/16/2021   Pneumococcal Conjugate-13 08/11/2014, 04/22/2019   Pneumococcal Polysaccharide-23 06/11/2005   Tdap 06/08/2016    TDAP status: Up to date  Flu Vaccine status: Up to date  Pneumococcal vaccine status: Up to date  Covid-19 vaccine status: Information provided on how to obtain vaccines.   Qualifies for Shingles Vaccine? Yes   Zostavax completed No   Shingrix Completed?: No.    Education has been provided regarding the importance of this vaccine. Patient has been advised to call insurance company to determine out of pocket expense if they have not yet received this vaccine. Advised may also receive vaccine at local pharmacy or Health Dept. Verbalized acceptance and understanding.  Screening Tests Health Maintenance  Topic Date Due    Zoster Vaccines- Shingrix (1 of 2) Never done   COVID-19 Vaccine (6 - 2023-24 season) 04/25/2022   Medicare Annual Wellness (AWV)  03/12/2023   INFLUENZA VACCINE  03/26/2023   DTaP/Tdap/Td (2 - Td or Tdap) 06/08/2026   Pneumonia Vaccine 77+ Years old  Completed   DEXA SCAN  Completed   HPV VACCINES  Aged Out    Health Maintenance  Health Maintenance Due  Topic Date Due   Zoster Vaccines- Shingrix (1 of 2) Never done   COVID-19 Vaccine (6 - 2023-24 season) 04/25/2022   Medicare Annual Wellness (AWV)  03/12/2023    Colorectal cancer screening: No longer required.   Mammogram status: No longer required due to age.  Bone Density status: pt declined  Lung Cancer Screening: (Low Dose CT Chest recommended if Age 6-80 years, 20 pack-year currently smoking OR have quit w/in 15years.) does not qualify.   Additional Screening:  Hepatitis C Screening: does not qualify  Vision Screening: Recommended annual ophthalmology exams for early detection of glaucoma and other disorders of the eye. Is the patient up to date with their annual eye exam?  Yes  Who is the provider or what is the name of the office in which the patient attends annual eye exams? Dr. Elmer Picker If pt is not established with a provider, would they like to be referred to a provider to establish care? No .   Dental Screening: Recommended annual dental exams for proper oral hygiene  Diabetic Foot Exam: N/a  Community Resource Referral / Chronic Care Management: CRR required this visit?  No   CCM required this visit?  No     Plan:     I have personally reviewed and noted the following in the patient's chart:   Medical and social history Use of alcohol, tobacco or illicit drugs  Current medications and supplements including opioid prescriptions. Patient is not currently taking opioid prescriptions. Functional ability and status Nutritional status Physical activity Advanced directives List of other  physicians Hospitalizations, surgeries, and ER visits in previous 12 months Vitals Screenings to include cognitive, depression, and falls Referrals and appointments  In addition, I have reviewed and discussed with patient certain preventive protocols, quality metrics, and best practice recommendations. A written personalized care plan for preventive services as well as general preventive health recommendations were provided to patient.     Donne Anon, CMA   03/16/2023   After Visit Summary: (MyChart) Due to this being a telephonic visit, the after visit summary  with patients personalized plan was offered to patient via MyChart   Nurse Notes: None

## 2023-04-13 ENCOUNTER — Encounter: Payer: Self-pay | Admitting: Family Medicine

## 2023-04-13 ENCOUNTER — Ambulatory Visit (INDEPENDENT_AMBULATORY_CARE_PROVIDER_SITE_OTHER): Payer: Medicare Other | Admitting: Family Medicine

## 2023-04-13 VITALS — BP 116/60 | HR 66 | Temp 98.2°F | Resp 18 | Ht 62.5 in

## 2023-04-13 DIAGNOSIS — L089 Local infection of the skin and subcutaneous tissue, unspecified: Secondary | ICD-10-CM | POA: Diagnosis not present

## 2023-04-13 DIAGNOSIS — Z23 Encounter for immunization: Secondary | ICD-10-CM | POA: Diagnosis not present

## 2023-04-13 DIAGNOSIS — T148XXA Other injury of unspecified body region, initial encounter: Secondary | ICD-10-CM | POA: Diagnosis not present

## 2023-04-13 DIAGNOSIS — S8991XA Unspecified injury of right lower leg, initial encounter: Secondary | ICD-10-CM

## 2023-04-13 MED ORDER — CEPHALEXIN 500 MG PO CAPS
500.0000 mg | ORAL_CAPSULE | Freq: Two times a day (BID) | ORAL | 0 refills | Status: DC
Start: 2023-04-13 — End: 2023-08-07

## 2023-04-13 MED ORDER — CEFTRIAXONE SODIUM 1 G IJ SOLR
1.0000 g | Freq: Once | INTRAMUSCULAR | Status: AC
Start: 2023-04-13 — End: 2023-04-13
  Administered 2023-04-13: 1 g via INTRAMUSCULAR

## 2023-04-13 MED ORDER — CEFTRIAXONE SODIUM 1 G IJ SOLR
1.0000 g | Freq: Once | INTRAMUSCULAR | Status: DC
Start: 2023-04-13 — End: 2023-04-13

## 2023-04-13 NOTE — Progress Notes (Signed)
Established Patient Office Visit  Subjective   Patient ID: [name], female    DOB: [birth year]  Age: 89 y.o. MRN: [id number]  Chief Complaint  Patient presents with   Laceration    Lower right leg, occurred Wed last week, stool fell on her leg    HPI Discussed the use of AI scribe software for clinical note transcription with the patient, who gave verbal consent to proceed.  History of Present Illness   The patient, with a history of thin skin, presents with a leg wound sustained when a step stool flipped over. The injury occurred on Wednesday or Thursday and has been managed at home with antibiotic ointment. The patient has a history of similar injuries, one of which required wound care in 2019. She is concerned about the wound becoming infected and interfering with a planned birthday party.  The patient also reports a recent fall on July 12th, during which she lost her footing and hit the corner of a counter. The fall resulted in significant bruising and suspected concussion, but the patient refused emergency treatment. The patient's daughter is concerned about potential elder abuse charges due to the appearance of the injuries.  The patient is also adjusting to a new hearing aid.      Patient Active Problem List   Diagnosis Date Noted   Chronic obstructive pulmonary disease, unspecified COPD type (HCC) 02/26/2021   PVD (peripheral vascular disease) (HCC) 02/26/2021   Severe protein-calorie malnutrition (HCC) 11/26/2020   Preventative health care 11/08/2018   Open wound of left lower leg 07/15/2018   Fall 07/10/2018   Wound of left leg 07/10/2018   Wound of right leg, initial encounter 07/10/2018   Pancreatic mass    Osteoarthritis of multiple joints 11/05/2017   Atherosclerosis of native coronary artery of native heart without angina pectoris 11/05/2017   Right renal mass    Suspicious mole 06/06/2013   Allergic drug reaction 07/23/2012   RLS (restless legs syndrome)  05/31/2012   Insomnia 05/26/2011   Osteoporosis 05/26/2011   Hypothyroidism 05/10/2008   HYPERCALCEMIA 03/16/2008   UTI 03/06/2008   Weakness generalized 03/06/2008   Hyperlipidemia 04/12/2007   ARTIFICIAL MENOPAUSE 04/12/2007   Anxiety state 01/12/2007   DEPRESSION 01/12/2007   MYOCARDIAL INFARCTION, HX OF 01/12/2007   Past Medical History:  Diagnosis Date   Allergy    Anemia    Anxiety    Cataract 05/2011   Chronic low blood pressure    due to dehydration   Depression    Esophageal disorder    Esophageal yeast infection (HCC)    GERD (gastroesophageal reflux disease)    Hearing loss    hearing aids   Hyperlipidemia    Hypothyroidism    Myocardial infarction (HCC)    Osteoporosis    Renal cell carcinoma (HCC)    found on CT   Thyroid disease    hypothyroidism   Transfusion history    last 2004   Past Surgical History:  Procedure Laterality Date   ABDOMINAL HYSTERECTOMY     APPENDECTOMY     BLEPHAROPLASTY Right    x 3 (all related to basal cell carcinoma)   COLONOSCOPY     CORONARY ARTERY BYPASS GRAFT     x2 vessel bypass -Dr. Excell Seltzer -[facility] 06-22-14   EUS N/A 02/04/2018   Procedure: UPPER ENDOSCOPIC ULTRASOUND (EUS) RADIAL;  Surgeon: Rachael Fee, MD;  Location: [facility];  Service: Endoscopy;  Laterality: N/A;   EYE SURGERY  FINE NEEDLE ASPIRATION N/A 02/04/2018   Procedure: FINE NEEDLE ASPIRATION (FNA) LINEAR;  Surgeon: Rachael Fee, MD;  Location: [facility];  Service: Endoscopy;  Laterality: N/A;   FRACTURE SURGERY     IR GENERIC HISTORICAL  08/29/2014   IR RADIOLOGIST EVAL & MGMT 08/29/2014 Irish Lack, MD [facility]   IR GENERIC HISTORICAL  10/22/2016   IR RADIOLOGIST EVAL & MGMT 10/22/2016 Irish Lack, MD [facility]   IR RADIOLOGIST EVAL & MGMT  11/03/2017   IR RADIOLOGIST EVAL & MGMT  02/15/2019   KYPHOSIS SURGERY     obstructed ureter Left    repair-operative   RENAL CRYOABLATION Right 09/2014    TONSILLECTOMY     TOTAL ABDOMINAL HYSTERECTOMY W/ BILATERAL SALPINGOOPHORECTOMY     Social History   Tobacco Use   Smoking status: Former    Current packs/day: 0.00    Average packs/day: 1 pack/day for 4.0 years (4.0 ttl pk-yrs)    Types: Cigarettes    Quit date: 08/25/1958    Years since quitting: 64.6   Smokeless tobacco: Never  Substance Use Topics   Alcohol use: No   Drug use: No   Social History   Socioeconomic History   Marital status: Widowed    Spouse name: Not on file   Number of children: 2   Years of education: Not on file   Highest education level: 12th grade  Occupational History   Occupation: Retired    Associate Professor: RETIRED  Tobacco Use   Smoking status: Former    Current packs/day: 0.00    Average packs/day: 1 pack/day for 4.0 years (4.0 ttl pk-yrs)    Types: Cigarettes    Quit date: 08/25/1958    Years since quitting: 64.6   Smokeless tobacco: Never  Substance and Sexual Activity   Alcohol use: No   Drug use: No   Sexual activity: Not Currently    Birth control/protection: Surgical  Other Topics Concern   Not on file  Social History Narrative   REG EXERCISE   WIDOW   LIVES ALONE   END OF LIFE:PATIENT HAS LIVING WILL AND CLEARLY STATES SHE DOES NOT WANT CARDIAC RESUSCITATION,MECHANICLA VENTILATION OR OTHER HEROIC OR FUTILE MEASURES.   Social Determinants of Health   Financial Resource Strain: Low Risk  (03/12/2023)   Overall Financial Resource Strain (CARDIA)    Difficulty of Paying Living Expenses: Not hard at all  Food Insecurity: No Food Insecurity (03/12/2023)   Hunger Vital Sign    Worried About Running Out of Food in the Last Year: Never true    Ran Out of Food in the Last Year: Never true  Transportation Needs: No Transportation Needs (03/12/2023)   PRAPARE - Administrator, Civil Service (Medical): No    Lack of Transportation (Non-Medical): No  Physical Activity: Sufficiently Active (03/12/2023)   Exercise Vital Sign    Days of  Exercise per Week: 5 days    Minutes of Exercise per Session: 30 min  Stress: No Stress Concern Present (03/12/2023)   [employer] of Occupational Health - Occupational Stress Questionnaire    Feeling of Stress : Only a little  Social Connections: Moderately Isolated (03/12/2023)   Social Connection and Isolation Panel [NHANES]    Frequency of Communication with Friends and Family: More than three times a week    Frequency of Social Gatherings with Friends and Family: More than three times a week    Attends Religious Services: More than 4 times per  year    Active Member of Clubs or Organizations: No    Attends Banker Meetings: Never    Marital Status: Widowed  Intimate Partner Violence: Not At Risk (03/16/2023)   Humiliation, Afraid, Rape, and Kick questionnaire    Fear of Current or Ex-Partner: No    Emotionally Abused: No    Physically Abused: No    Sexually Abused: No   Family Status  Relation Name Status   Father  Deceased   Other fam hx of Alive   Mother  Deceased   MGM  Deceased   MGF  Deceased   PGM  Deceased   PGF  Deceased   Sister  (Not Specified)   Other neice Alive   Daughter Bonita Quin (Not Specified)   Daughter Harriett Sine (Not Specified)   Neg Hx  (Not Specified)  No partnership data on file   Family History  Problem Relation Age of Onset   Parkinsonism Father    Hypertension Other    Heart disease Other        CAD.Marland Kitchen1ST DEGREE RELATIVE <60   Hyperlipidemia Other    Breast cancer Sister    Breast cancer Other    Arthritis Daughter    Vision loss Daughter    Early death Daughter    Hypertension Daughter    Colon cancer Neg Hx    Esophageal cancer Neg Hx    Rectal cancer Neg Hx    Stomach cancer Neg Hx    Allergies  Allergen Reactions   Zofran [Ondansetron Hcl] Anaphylaxis and Other (See Comments)    Tongue swelling, lost of voice   Clarithromycin     Pt does not remember the reaction   Codeine     Per the pt, "It made me disoriented"    Diazepam Other (See Comments)    Per the pt, "I don't remember the reaction"   Sulfonamide Derivatives     Per the pt, "It made me disoriented"   Prilosec [Omeprazole Magnesium] Rash   Trazodone Anxiety      Review of Systems  Constitutional:  Negative for chills, fever and malaise/fatigue.  HENT:  Negative for congestion and hearing loss.   Eyes:  Negative for blurred vision and discharge.  Respiratory:  Negative for cough, sputum production and shortness of breath.   Cardiovascular:  Negative for chest pain, palpitations and leg swelling.  Gastrointestinal:  Negative for abdominal pain, blood in stool, constipation, diarrhea, heartburn, nausea and vomiting.  Genitourinary:  Negative for dysuria, frequency, hematuria and urgency.  Musculoskeletal:  Negative for back pain, falls and myalgias.  Skin:  Negative for rash.  Neurological:  Negative for dizziness, sensory change, loss of consciousness, weakness and headaches.  Endo/Heme/Allergies:  Negative for environmental allergies. Does not bruise/bleed easily.  Psychiatric/Behavioral:  Negative for depression and suicidal ideas. The patient is not nervous/anxious and does not have insomnia.       Objective:     BP 116/60 (BP Location: Left Arm, Patient Position: Sitting, Cuff Size: Small)   Pulse 66   Temp 98.2 F (36.8 C) (Oral)   Resp 18   Ht 5' 2.5" (1.588 m)   SpO2 97%   BMI 15.66 kg/m  BP Readings from Last 3 Encounters:  04/13/23 116/60  01/05/23 128/60  10/30/22 124/60   Wt Readings from Last 3 Encounters:  01/05/23 87 lb (39.5 kg)  07/01/22 82 lb 6.4 oz (37.4 kg)  12/24/21 80 lb (36.3 kg)   SpO2 Readings from Last 3 Encounters:  04/13/23 97%  01/05/23 99%  10/30/22 98%      Physical Exam Vitals and nursing note reviewed.  Constitutional:      General: She is not in acute distress.    Appearance: Normal appearance. She is well-developed.  HENT:     Head: Normocephalic and atraumatic.     Right Ear:  Tympanic membrane, ear canal and external ear normal. There is no impacted cerumen.     Left Ear: Tympanic membrane, ear canal and external ear normal. There is no impacted cerumen.     Nose: Nose normal.     Mouth/Throat:     Mouth: Mucous membranes are moist.     Pharynx: Oropharynx is clear. No oropharyngeal exudate or posterior oropharyngeal erythema.  Eyes:     General: No scleral icterus.       Right eye: No discharge.        Left eye: No discharge.     Conjunctiva/sclera: Conjunctivae normal.     Pupils: Pupils are equal, round, and reactive to light.  Neck:     Thyroid: No thyromegaly or thyroid tenderness.     Vascular: No JVD.  Cardiovascular:     Rate and Rhythm: Normal rate and regular rhythm.     Heart sounds: Normal heart sounds. No murmur heard. Pulmonary:     Effort: Pulmonary effort is normal. No respiratory distress.     Breath sounds: Normal breath sounds.  Abdominal:     General: Bowel sounds are normal. There is no distension.     Palpations: Abdomen is soft. There is no mass.     Tenderness: There is no abdominal tenderness. There is no guarding or rebound.  Genitourinary:    Vagina: Normal.  Musculoskeletal:        General: Normal range of motion.     Cervical back: Normal range of motion and neck supple.     Right lower leg: No edema.     Left lower leg: No edema.  Lymphadenopathy:     Cervical: No cervical adenopathy.  Skin:    General: Skin is warm and dry.     Findings: No erythema or rash.  Neurological:     Mental Status: She is alert and oriented to person, place, and time.     Cranial Nerves: No cranial nerve deficit.     Deep Tendon Reflexes: Reflexes are normal and symmetric.  Psychiatric:        Mood and Affect: Mood normal.        Behavior: Behavior normal.        Thought Content: Thought content normal.        Judgment: Judgment normal.      No results found for any visits on 04/13/23.  Last CBC Lab Results  Component Value  Date   WBC 6.0 01/05/2023   HGB 11.9 (L) 01/05/2023   HCT 35.0 (L) 01/05/2023   MCV 91.4 01/05/2023   MCH 30.7 11/04/2020   RDW 13.0 01/05/2023   PLT 353.0 01/05/2023   Last metabolic panel Lab Results  Component Value Date   GLUCOSE 90 01/05/2023   NA 136 01/05/2023   K 4.0 01/05/2023   CL 95 (L) 01/05/2023   CO2 29 01/05/2023   BUN 11 01/05/2023   CREATININE 1.11 01/05/2023   GFR 43.09 (L) 01/05/2023   CALCIUM 9.8 01/05/2023   PROT 6.6 01/05/2023   ALBUMIN 4.0 01/05/2023   BILITOT 0.5 01/05/2023   ALKPHOS 71 01/05/2023   AST 33 01/05/2023  ALT 24 01/05/2023   ANIONGAP 9 11/04/2020   Last lipids Lab Results  Component Value Date   CHOL 170 01/05/2023   HDL 71.80 01/05/2023   LDLCALC 84 01/05/2023   LDLDIRECT 165.1 05/10/2008   TRIG 68.0 01/05/2023   CHOLHDL 2 01/05/2023   Last hemoglobin A1c Lab Results  Component Value Date   HGBA1C 5.8 10/27/2013   Last thyroid functions Lab Results  Component Value Date   TSH 1.15 01/05/2023   T4TOTAL 10.9 07/01/2022   Last vitamin D Lab Results  Component Value Date   VD25OH 86.48 07/01/2022   Last vitamin B12 and Folate Lab Results  Component Value Date   VITAMINB12 >1500 (H) 07/01/2022   FOLATE >20.0 ng/mL 05/22/2010      The ASCVD Risk score (Arnett DK, et al., 2019) failed to calculate for the following reasons:   The 2019 ASCVD risk score is only valid for ages 76 to 67   The patient has a prior MI or stroke diagnosis    Assessment & Plan:   Problem List Items Addressed This Visit   None Visit Diagnoses     Injury of right shin, initial encounter    -  Primary   Relevant Medications   cefTRIAXone (ROCEPHIN) injection 1 g (Completed)   Other Relevant Orders   Wound culture   Td : Tetanus/diphtheria >7yo Preservative  free (Completed)   Wound infection       Relevant Medications   cephALEXin (KEFLEX) 500 MG capsule   cefTRIAXone (ROCEPHIN) injection 1 g (Completed)   Other Relevant Orders    Td : Tetanus/diphtheria >7yo Preservative  free (Completed)      Assessment and Plan    Skin Tear Occurred on 04/08/2023 or 04/09/2023 due to a fall. Noted thin skin and history of requiring wound care for similar injury in 2019. Concern for potential infection. -Perform wound culture. -Start Keflex. -Administer Rocephin shot to jumpstart antibiotic treatment. -Check wound on 04/16/2023 to ensure improvement.  Tetanus Prophylaxis Last tetanus shot in 2017. Due to current skin tear, tetanus shot update is indicated. -Administer tetanus shot today.  Fall Risk Recent fall with bruising and disorientation. Refused ER visit. -Advise patient and caregiver on fall prevention strategies.  Follow-up Plan to reassess wound on 04/16/2023 to ensure improvement and prevent worsening over the weekend.       No follow-ups on file.    Donato Schultz, DO

## 2023-04-16 ENCOUNTER — Ambulatory Visit (INDEPENDENT_AMBULATORY_CARE_PROVIDER_SITE_OTHER): Payer: Medicare Other | Admitting: Family Medicine

## 2023-04-16 ENCOUNTER — Encounter: Payer: Self-pay | Admitting: Family Medicine

## 2023-04-16 VITALS — BP 110/60 | HR 86 | Temp 98.6°F | Ht 62.5 in

## 2023-04-16 DIAGNOSIS — L089 Local infection of the skin and subcutaneous tissue, unspecified: Secondary | ICD-10-CM | POA: Diagnosis not present

## 2023-04-16 DIAGNOSIS — T148XXA Other injury of unspecified body region, initial encounter: Secondary | ICD-10-CM | POA: Diagnosis not present

## 2023-04-16 DIAGNOSIS — L03115 Cellulitis of right lower limb: Secondary | ICD-10-CM | POA: Diagnosis not present

## 2023-04-16 LAB — WOUND CULTURE
MICRO NUMBER:: 15349238
SPECIMEN QUALITY:: ADEQUATE

## 2023-04-16 MED ORDER — LEVOFLOXACIN 500 MG PO TABS
500.0000 mg | ORAL_TABLET | Freq: Every day | ORAL | 0 refills | Status: DC
Start: 2023-04-16 — End: 2023-04-16

## 2023-04-16 MED ORDER — CEFTRIAXONE SODIUM 1 G IJ SOLR
1.0000 g | Freq: Once | INTRAMUSCULAR | Status: AC
Start: 2023-04-16 — End: 2023-04-16
  Administered 2023-04-16: 1 g via INTRAMUSCULAR

## 2023-04-16 MED ORDER — LEVOFLOXACIN 500 MG PO TABS
500.0000 mg | ORAL_TABLET | Freq: Every day | ORAL | 0 refills | Status: DC
Start: 2023-04-16 — End: 2024-01-11

## 2023-04-16 NOTE — Progress Notes (Signed)
Established Patient Office Visit  Subjective   Patient ID: [name], female    DOB: 07-19-1928  Age: 89 y.o. MRN: [id number]  Chief Complaint  Patient presents with   Wound Check    HPI Discussed the use of AI scribe software for clinical note transcription with the patient, who gave verbal consent to proceed.  History of Present Illness   The patient, with a history of recurrent skin infections, presents with a worsening, warm, swollen, and painful wound. Despite treatment with Keflex, the wound has not improved. The patient reports that the wound is so sensitive that she finds it hard to touch. The patient has a history of a similar wound in 2019, which was managed with Levaquin and wound clinic follow-up. The patient also mentions frequent bruising and bleeding, which she attributes to thin skin and easy tearing. The patient reports that once she bruises, the discoloration does not fade.      Patient Active Problem List   Diagnosis Date Noted   Chronic obstructive pulmonary disease, unspecified COPD type (HCC) 02/26/2021   PVD (peripheral vascular disease) (HCC) 02/26/2021   Severe protein-calorie malnutrition (HCC) 11/26/2020   Preventative health care 11/08/2018   Open wound of left lower leg 07/15/2018   Fall 07/10/2018   Wound of left leg 07/10/2018   Wound of right leg, initial encounter 07/10/2018   Pancreatic mass    Osteoarthritis of multiple joints 11/05/2017   Atherosclerosis of native coronary artery of native heart without angina pectoris 11/05/2017   Right renal mass    Suspicious mole 06/06/2013   Allergic drug reaction 07/23/2012   RLS (restless legs syndrome) 05/31/2012   Insomnia 05/26/2011   Osteoporosis 05/26/2011   Hypothyroidism 05/10/2008   HYPERCALCEMIA 03/16/2008   UTI 03/06/2008   Weakness generalized 03/06/2008   Hyperlipidemia 04/12/2007   ARTIFICIAL MENOPAUSE 04/12/2007   Anxiety state 01/12/2007   DEPRESSION 01/12/2007   MYOCARDIAL  INFARCTION, HX OF 01/12/2007   Past Medical History:  Diagnosis Date   Allergy    Anemia    Anxiety    Cataract 05/2011   Chronic low blood pressure    due to dehydration   Depression    Esophageal disorder    Esophageal yeast infection (HCC)    GERD (gastroesophageal reflux disease)    Hearing loss    hearing aids   Hyperlipidemia    Hypothyroidism    Myocardial infarction (HCC)    Osteoporosis    Renal cell carcinoma (HCC)    found on CT   Thyroid disease    hypothyroidism   Transfusion history    last 2004   Past Surgical History:  Procedure Laterality Date   ABDOMINAL HYSTERECTOMY     APPENDECTOMY     BLEPHAROPLASTY Right    x 3 (all related to basal cell carcinoma)   COLONOSCOPY     CORONARY ARTERY BYPASS GRAFT     x2 vessel bypass -Dr. Excell Seltzer -[facility] 06-22-14   EUS N/A 02/04/2018   Procedure: UPPER ENDOSCOPIC ULTRASOUND (EUS) RADIAL;  Surgeon: Rachael Fee, MD;  Location: [facility];  Service: Endoscopy;  Laterality: N/A;   EYE SURGERY     FINE NEEDLE ASPIRATION N/A 02/04/2018   Procedure: FINE NEEDLE ASPIRATION (FNA) LINEAR;  Surgeon: Rachael Fee, MD;  Location: [facility];  Service: Endoscopy;  Laterality: N/A;   FRACTURE SURGERY     IR GENERIC HISTORICAL  08/29/2014   IR RADIOLOGIST EVAL & MGMT 08/29/2014 Irish Lack, MD [facility]  INTERV RAD   IR GENERIC HISTORICAL  10/22/2016   IR RADIOLOGIST EVAL & MGMT 10/22/2016 Irish Lack, MD [facility] INTERV RAD   IR RADIOLOGIST EVAL & MGMT  11/03/2017   IR RADIOLOGIST EVAL & MGMT  02/15/2019   KYPHOSIS SURGERY     obstructed ureter Left    repair-operative   RENAL CRYOABLATION Right 09/2014   TONSILLECTOMY     TOTAL ABDOMINAL HYSTERECTOMY W/ BILATERAL SALPINGOOPHORECTOMY     Social History   Tobacco Use   Smoking status: Former    Current packs/day: 0.00    Average packs/day: 1 pack/day for 4.0 years (4.0 ttl pk-yrs)    Types: Cigarettes    Quit date: 08/25/1958    Years since quitting:  64.6   Smokeless tobacco: Never  Substance Use Topics   Alcohol use: No   Drug use: No   Social History   Socioeconomic History   Marital status: Widowed    Spouse name: Not on file   Number of children: 2   Years of education: Not on file   Highest education level: 12th grade  Occupational History   Occupation: Retired    Associate Professor: RETIRED  Tobacco Use   Smoking status: Former    Current packs/day: 0.00    Average packs/day: 1 pack/day for 4.0 years (4.0 ttl pk-yrs)    Types: Cigarettes    Quit date: 08/25/1958    Years since quitting: 64.6   Smokeless tobacco: Never  Substance and Sexual Activity   Alcohol use: No   Drug use: No   Sexual activity: Not Currently    Birth control/protection: Surgical  Other Topics Concern   Not on file  Social History Narrative   REG EXERCISE   WIDOW   LIVES ALONE   END OF LIFE:PATIENT HAS LIVING WILL AND CLEARLY STATES SHE DOES NOT WANT CARDIAC RESUSCITATION,MECHANICLA VENTILATION OR OTHER HEROIC OR FUTILE MEASURES.   Social Determinants of Health   Financial Resource Strain: Low Risk  (03/12/2023)   Overall Financial Resource Strain (CARDIA)    Difficulty of Paying Living Expenses: Not hard at all  Food Insecurity: No Food Insecurity (03/12/2023)   Hunger Vital Sign    Worried About Running Out of Food in the Last Year: Never true    Ran Out of Food in the Last Year: Never true  Transportation Needs: No Transportation Needs (03/12/2023)   PRAPARE - Administrator, Civil Service (Medical): No    Lack of Transportation (Non-Medical): No  Physical Activity: Sufficiently Active (03/12/2023)   Exercise Vital Sign    Days of Exercise per Week: 5 days    Minutes of Exercise per Session: 30 min  Stress: No Stress Concern Present (03/12/2023)   [employer] of Occupational Health - Occupational Stress Questionnaire    Feeling of Stress : Only a little  Social Connections: Moderately Isolated (03/12/2023)   Social Connection  and Isolation Panel [NHANES]    Frequency of Communication with Friends and Family: More than three times a week    Frequency of Social Gatherings with Friends and Family: More than three times a week    Attends Religious Services: More than 4 times per year    Active Member of [employer] or Organizations: No    Attends Banker Meetings: Never    Marital Status: Widowed  Intimate Partner Violence: Not At Risk (03/16/2023)   Humiliation, Afraid, Rape, and Kick questionnaire    Fear of Current or Ex-Partner: No  Emotionally Abused: No    Physically Abused: No    Sexually Abused: No   Family Status  Relation Name Status   Father  Deceased   Other fam hx of Alive   Mother  Deceased   MGM  Deceased   MGF  Deceased   PGM  Deceased   PGF  Deceased   Sister  (Not Specified)   Other neice Alive   Daughter Bonita Quin (Not Specified)   Daughter Harriett Sine (Not Specified)   Neg Hx  (Not Specified)  No partnership data on file   Family History  Problem Relation Age of Onset   Parkinsonism Father    Hypertension Other    Heart disease Other        CAD.Marland Kitchen1ST DEGREE RELATIVE <60   Hyperlipidemia Other    Breast cancer Sister    Breast cancer Other    Arthritis Daughter    Vision loss Daughter    Early death Daughter    Hypertension Daughter    Colon cancer Neg Hx    Esophageal cancer Neg Hx    Rectal cancer Neg Hx    Stomach cancer Neg Hx    Allergies  Allergen Reactions   Zofran [Ondansetron Hcl] Anaphylaxis and Other (See Comments)    Tongue swelling, lost of voice   Clarithromycin     Pt does not remember the reaction   Codeine     Per the pt, "It made me disoriented"   Diazepam Other (See Comments)    Per the pt, "I don't remember the reaction"   Sulfonamide Derivatives     Per the pt, "It made me disoriented"   Prilosec [Omeprazole Magnesium] Rash   Trazodone Anxiety      Review of Systems  Constitutional:  Negative for fever and malaise/fatigue.  HENT:  Negative  for congestion.   Eyes:  Negative for blurred vision.  Respiratory:  Negative for shortness of breath.   Cardiovascular:  Negative for chest pain, palpitations and leg swelling.  Gastrointestinal:  Negative for abdominal pain, blood in stool and nausea.  Genitourinary:  Negative for dysuria and frequency.  Musculoskeletal:  Negative for falls.  Skin:  Negative for rash.  Neurological:  Negative for dizziness, loss of consciousness and headaches.  Endo/Heme/Allergies:  Negative for environmental allergies.  Psychiatric/Behavioral:  Negative for depression. The patient is not nervous/anxious.       Objective:     BP 110/60 (BP Location: Left Arm, Patient Position: Sitting, Cuff Size: Normal)   Pulse 86   Temp 98.6 F (37 C) (Oral)   Ht 5' 2.5" (1.588 m)   HC 16" (40.6 cm)   SpO2 98%   BMI 15.66 kg/m  BP Readings from Last 3 Encounters:  04/16/23 110/60  04/13/23 116/60  01/05/23 128/60   Wt Readings from Last 3 Encounters:  01/05/23 87 lb (39.5 kg)  07/01/22 82 lb 6.4 oz (37.4 kg)  12/24/21 80 lb (36.3 kg)   SpO2 Readings from Last 3 Encounters:  04/16/23 98%  04/13/23 97%  01/05/23 99%      Physical Exam Vitals and nursing note reviewed.  Constitutional:      General: She is not in acute distress.    Appearance: Normal appearance. She is well-developed.  HENT:     Head: Normocephalic and atraumatic.  Eyes:     General: No scleral icterus.       Right eye: No discharge.        Left eye: No discharge.  Cardiovascular:  Rate and Rhythm: Normal rate and regular rhythm.     Heart sounds: No murmur heard. Pulmonary:     Effort: Pulmonary effort is normal. No respiratory distress.     Breath sounds: Normal breath sounds.  Musculoskeletal:        General: Normal range of motion.     Cervical back: Normal range of motion and neck supple.     Right lower leg: No edema.     Left lower leg: No edema.  Skin:    General: Skin is warm and dry.     Findings:  Erythema and lesion present.          Comments: Lesion actually looks worse  Hot to touch  And tender to touch   Neurological:     Mental Status: She is alert and oriented to person, place, and time.  Psychiatric:        Mood and Affect: Mood normal.        Behavior: Behavior normal.        Thought Content: Thought content normal.        Judgment: Judgment normal.      No results found for any visits on 04/16/23.  Last CBC Lab Results  Component Value Date   WBC 6.0 01/05/2023   HGB 11.9 (L) 01/05/2023   HCT 35.0 (L) 01/05/2023   MCV 91.4 01/05/2023   MCH 30.7 11/04/2020   RDW 13.0 01/05/2023   PLT 353.0 01/05/2023   Last metabolic panel Lab Results  Component Value Date   GLUCOSE 90 01/05/2023   NA 136 01/05/2023   K 4.0 01/05/2023   CL 95 (L) 01/05/2023   CO2 29 01/05/2023   BUN 11 01/05/2023   CREATININE 1.11 01/05/2023   GFR 43.09 (L) 01/05/2023   CALCIUM 9.8 01/05/2023   PROT 6.6 01/05/2023   ALBUMIN 4.0 01/05/2023   BILITOT 0.5 01/05/2023   ALKPHOS 71 01/05/2023   AST 33 01/05/2023   ALT 24 01/05/2023   ANIONGAP 9 11/04/2020   Last lipids Lab Results  Component Value Date   CHOL 170 01/05/2023   HDL 71.80 01/05/2023   LDLCALC 84 01/05/2023   LDLDIRECT 165.1 05/10/2008   TRIG 68.0 01/05/2023   CHOLHDL 2 01/05/2023   Last hemoglobin A1c Lab Results  Component Value Date   HGBA1C 5.8 10/27/2013   Last thyroid functions Lab Results  Component Value Date   TSH 1.15 01/05/2023   T4TOTAL 10.9 07/01/2022   Last vitamin D Lab Results  Component Value Date   VD25OH 86.48 07/01/2022   Last vitamin B12 and Folate Lab Results  Component Value Date   VITAMINB12 >1500 (H) 07/01/2022   FOLATE >20.0 ng/mL 05/22/2010      The ASCVD Risk score (Arnett DK, et al., 2019) failed to calculate for the following reasons:   The 2019 ASCVD risk score is only valid for ages 44 to 76   The patient has a prior MI or stroke diagnosis    Assessment &  Plan:   Problem List Items Addressed This Visit   None Visit Diagnoses     Wound infection    -  Primary   Relevant Medications   cefTRIAXone (ROCEPHIN) injection 1 g   levofloxacin (LEVAQUIN) 500 MG tablet   Other Relevant Orders   Ambulatory referral to Wound Clinic   Cellulitis of right lower extremity       Relevant Medications   cefTRIAXone (ROCEPHIN) injection 1 g   levofloxacin (LEVAQUIN) 500 MG  tablet   Other Relevant Orders   Ambulatory referral to Wound Clinic       No follow-ups on file.    Donato Schultz, DO

## 2023-04-29 ENCOUNTER — Ambulatory Visit: Payer: Medicare Other | Admitting: Internal Medicine

## 2023-05-01 ENCOUNTER — Encounter (HOSPITAL_BASED_OUTPATIENT_CLINIC_OR_DEPARTMENT_OTHER): Payer: Medicare Other | Attending: Internal Medicine | Admitting: Internal Medicine

## 2023-05-01 DIAGNOSIS — W2203XA Walked into furniture, initial encounter: Secondary | ICD-10-CM | POA: Insufficient documentation

## 2023-05-01 DIAGNOSIS — I2510 Atherosclerotic heart disease of native coronary artery without angina pectoris: Secondary | ICD-10-CM | POA: Insufficient documentation

## 2023-05-01 DIAGNOSIS — Z87891 Personal history of nicotine dependence: Secondary | ICD-10-CM | POA: Insufficient documentation

## 2023-05-01 DIAGNOSIS — I87311 Chronic venous hypertension (idiopathic) with ulcer of right lower extremity: Secondary | ICD-10-CM | POA: Diagnosis not present

## 2023-05-01 DIAGNOSIS — T798XXA Other early complications of trauma, initial encounter: Secondary | ICD-10-CM | POA: Diagnosis not present

## 2023-05-01 DIAGNOSIS — J449 Chronic obstructive pulmonary disease, unspecified: Secondary | ICD-10-CM | POA: Insufficient documentation

## 2023-05-01 DIAGNOSIS — L97812 Non-pressure chronic ulcer of other part of right lower leg with fat layer exposed: Secondary | ICD-10-CM | POA: Diagnosis not present

## 2023-05-01 NOTE — Progress Notes (Signed)
LAUREY, FASOLINO ([id number]) [id number] Page 1 of 11 Visit Report for 05/01/2023 Allergy List Details Patient Name: Date of Service: [name] 05/01/2023 8:00 A M Medical Record Number: [id number] Patient Account Number: [id number] Date of Birth/Sex: Treating RN: 13-Dec-1927 (89 y.o. Katrinka Blazing Primary Care Monterio Bob: Seabron Spates Other Clinician: Referring Bently Morath: Treating Taresa Montville/Extender: Ranee Gosselin in Treatment: 0 Allergies Active Allergies trazodone codeine clarithromycin diazepam Sulfa (Sulfonamide Antibiotics) Prilosec Allergy Notes Electronic Signature(s) Signed: 05/01/2023 1:29:11 PM By: Karie Schwalbe RN Entered By: Karie Schwalbe on 05/01/2023 05:21:59 -------------------------------------------------------------------------------- Arrival Information Details Patient Name: Date of Service: [name]. 05/01/2023 8:00 A M Medical Record Number: [id number] Patient Account Number: [id number] Date of Birth/Sex: Treating RN: Aug 19, 1928 (89 y.o. Woodroe Mode, Randa Evens Primary Care Luvern Mischke: Seabron Spates Other Clinician: Referring Faylene Allerton: Treating Sulaiman Imbert/Extender: Ranee Gosselin in Treatment: 0 Visit Information Patient Arrived: Wheel Chair Arrival Time: 08:17 Accompanied By: daughter Transfer Assistance: Nurse, adult Patient Identification Verified: Yes Patient Has Alerts: Yes Patient Alerts: R ABI [place] History Since Last Visit Added or deleted any medications: No Any new allergies or adverse reactions: No Had a fall or experienced change in activities of daily living that may affect risk of falls: No Signs or symptoms of abuse/neglect since last visito No Hospitalized since last visit: No Implantable device outside of the clinic excluding cellular tissue based products placed in the center since last visit: No Electronic Signature(s) Signed:  05/01/2023 1:29:11 PM By: Karie Schwalbe RN [facility] Lanius, Amado Coe ([id number]) [id number] Page 2 of 11 Entered By: Karie Schwalbe on 05/01/2023 06:00:05 -------------------------------------------------------------------------------- Clinic Level of Care Assessment Details Patient Name: Date of Service: [name] 05/01/2023 8:00 A M Medical Record Number: [id number] Patient Account Number: [id number] Date of Birth/Sex: Treating RN: [birth year] (89 y.o. Katrinka Blazing Primary Care Berlyn Malina: Seabron Spates Other Clinician: Referring Jendaya Gossett: Treating Dylynn Ketner/Extender: Ranee Gosselin in Treatment: 0 Clinic Level of Care Assessment Items TOOL 1 Quantity Score X- 1 0 Use when EandM and Procedure is performed on INITIAL visit ASSESSMENTS - Nursing Assessment / Reassessment X- 1 20 General Physical Exam (combine w/ comprehensive assessment (listed just below) when performed on new pt. evals) X- 1 25 Comprehensive Assessment (HX, ROS, Risk Assessments, Wounds Hx, etc.) ASSESSMENTS - Wound and Skin Assessment / Reassessment X- 1 10 Dermatologic / Skin Assessment (not related to wound area) ASSESSMENTS - Ostomy and/or Continence Assessment and Care []  - 0 Incontinence Assessment and Management []  - 0 Ostomy Care Assessment and Management (repouching, etc.) PROCESS - Coordination of Care X - Simple Patient / Family Education for ongoing care 1 15 []  - 0 Complex (extensive) Patient / Family Education for ongoing care X- 1 10 Staff obtains Chiropractor, Records, T Results / Process Orders est X- 1 10 Staff telephones HHA, Nursing Homes / Clarify orders / etc []  - 0 Routine Transfer to another Facility (non-emergent condition) []  - 0 Routine Hospital Admission (non-emergent condition) X- 1 15 New Admissions / Manufacturing engineer / Ordering NPWT Apligraf, etc. , []  - 0 Emergency Hospital Admission (emergent  condition) PROCESS - Special Needs []  - 0 Pediatric / Minor Patient Management []  - 0 Isolation Patient Management []  - 0 Hearing / Language / Visual special needs []  - 0 Assessment of Community assistance (transportation, D/C planning, etc.) []  - 0 Additional assistance / Altered mentation []  - 0 Support Surface(s) Assessment (bed, cushion, seat, etc.) INTERVENTIONS - Miscellaneous []  -  0 External ear exam []  - 0 Patient Transfer (multiple staff / Nurse, adult / Similar devices) []  - 0 Simple Staple / Suture removal (25 or less) []  - 0 Complex Staple / Suture removal (26 or more) []  - 0 Hypo/Hyperglycemic Management (do not check if billed separately) X- 1 15 Ankle / Brachial Index (ABI) - do not check if billed separately Has the patient been seen at the hospital within the last three years: Yes Total Score: 120 Level Of Care: New/Established - Level 4 KAIDANCE, JOAQUIM ([id number]) [id number] Page 3 of 11 Electronic Signature(s) Signed: 05/01/2023 1:29:11 PM By: Karie Schwalbe RN Entered By: Karie Schwalbe on 05/01/2023 07:19:13 -------------------------------------------------------------------------------- Encounter Discharge Information Details Patient Name: Date of Service: [name] 05/01/2023 8:00 A M Medical Record Number: [id number] Patient Account Number: [id number] Date of Birth/Sex: Treating RN: 1928-01-17 (89 y.o. Katrinka Blazing Primary Care Caliya Narine: Seabron Spates Other Clinician: Referring Elvi Leventhal: Treating Jaivian Battaglini/Extender: Ranee Gosselin in Treatment: 0 Encounter Discharge Information Items Post Procedure Vitals Discharge Condition: Stable Temperature (F): 98.3 Ambulatory Status: Wheelchair Pulse (bpm): 78 Discharge Destination: Home Respiratory Rate (breaths/min): 14 Transportation: Private Auto Blood Pressure (mmHg): 148/67 Accompanied By: daughter Schedule Follow-up  Appointment: Yes Clinical Summary of Care: Patient Declined Electronic Signature(s) Signed: 05/01/2023 1:29:11 PM By: Karie Schwalbe RN Entered By: Karie Schwalbe on 05/01/2023 10:28:37 -------------------------------------------------------------------------------- Lower Extremity Assessment Details Patient Name: Date of Service: [name] 05/01/2023 8:00 A M Medical Record Number: [id number] Patient Account Number: [id number] Date of Birth/Sex: Treating RN: [birth year] (89 y.o. Katrinka Blazing Primary Care Isabele Lollar: Seabron Spates Other Clinician: Referring Chan Rosasco: Treating Yehudit Fulginiti/Extender: Ranee Gosselin in Treatment: 0 Edema Assessment Assessed: [Left: No] [Right: No] [Left: Edema] [Right: :] Calf Left: Right: Point of Measurement: 31 cm From Medial Instep 27.5 cm Ankle Left: Right: Point of Measurement: 8 cm From Medial Instep 22 cm Knee To Floor Left: Right: From Medial Instep 40 cm Vascular Assessment PulsesKATHYA, NEMER ([id number]) [Right:[id number] Page 4 of 11] Dorsalis Pedis Palpable: [Right:Yes] Doppler Audible: [Right:Yes] Posterior Tibial Palpable: [Right:Yes] Extremity colors, hair growth, and conditions: Extremity Color: [Right:Pale] Hair Growth on Extremity: [Right:No] Temperature of Extremity: [Right:Warm] Capillary Refill: [Right:> 3 seconds] Dependent Rubor: [Right:No] Blanched when Elevated: [Right:No No] Toe Nail Assessment Left: Right: Thick: No Discolored: No Deformed: No Improper Length and Hygiene: No Electronic Signature(s) Signed: 05/01/2023 1:29:11 PM By: Karie Schwalbe RN Entered By: Karie Schwalbe on 05/01/2023 06:19:44 -------------------------------------------------------------------------------- Multi Wound Chart Details Patient Name: Date of Service: Hermelinda Dellen H. 05/01/2023 8:00 A M Medical Record Number: [id number] Patient Account Number:  [id number] Date of Birth/Sex: Treating RN: 1927-11-21 (89 y.o. F) Primary Care Kewana Sanon: Seabron Spates Other Clinician: Referring Namira Rosekrans: Treating Diara Chaudhari/Extender: Ranee Gosselin in Treatment: 0 Vital Signs Height(in): 62 Pulse(bpm): 78 Weight(lbs): 85 Blood Pressure(mmHg): 148/67 Body Mass Index(BMI): 15.5 Temperature(F): 98.3 Respiratory Rate(breaths/min): 14 [3:Photos:] [N/A:N/A] Right, Proximal, Anterior Lower Leg Right, Distal, Anterior Lower Leg N/A Wound Location: Hematoma Hematoma N/A Wounding Event: Trauma, Other Trauma, Other N/A Primary Etiology: Cataracts, Anemia, Hypotension, Cataracts, Anemia, Hypotension, N/A Comorbid History: Myocardial Infarction Myocardial Infarction 04/05/2023 04/05/2023 N/A Date Acquired: 0 0 N/A Weeks of Treatment: Open Open N/A Wound Status: No No N/A Wound Recurrence: 0.5x1.2x0.1 2.2x2x0.1 N/A Measurements L x W x D (cm) 0.471 3.456 N/A A (cm) : rea 0.047 0.346 N/A Volume (cm) : Full Thickness Without Exposed Full Thickness Without Exposed N/A Classification: Support Structures Support  Structures Medium Medium N/A Exudate Amount: HARSHIKA, GROSSHANS ([id number]) [id number] Page 5 of 11 Serosanguineous Serosanguineous N/A Exudate Type: red, brown red, brown N/A Exudate Color: Distinct, outline attached Fibrotic scar, thickened scar N/A Wound Margin: Small (1-33%) Small (1-33%) N/A Granulation Amount: Red Red N/A Granulation Quality: Large (67-100%) Large (67-100%) N/A Necrotic Amount: Eschar, Adherent Slough Eschar, Adherent Slough N/A Necrotic Tissue: Fat Layer (Subcutaneous Tissue): Yes Fat Layer (Subcutaneous Tissue): Yes N/A Exposed Structures: Fascia: No Fascia: No Tendon: No Tendon: No Muscle: No Muscle: No Joint: No Joint: No Bone: No Bone: No Small (1-33%) Small (1-33%) N/A Epithelialization: Debridement - Selective/Open Wound Debridement -  Selective/Open Wound N/A Debridement: Pre-procedure Verification/Time Out 09:20 09:20 N/A Taken: Lidocaine 4% Topical Solution Lidocaine 4% Topical Solution N/A Pain Control: [employer] N/A Tissue Debrided: Skin/Epidermis Skin/Epidermis N/A Level: 0.47 3.45 N/A Debridement A (sq cm): rea Curette Curette N/A Instrument: Minimum Minimum N/A Bleeding: Pressure Pressure N/A Hemostasis A chieved: 0 0 N/A Procedural Pain: 0 0 N/A Post Procedural Pain: Procedure was tolerated well Procedure was tolerated well N/A Debridement Treatment Response: 0.5x1.2x0.1 2.2x2x0.1 N/A Post Debridement Measurements L x W x D (cm) 0.047 0.346 N/A Post Debridement Volume: (cm) Scarring: Yes Excoriation: No N/A Periwound Skin Texture: Induration: No Callus: No Crepitus: No Rash: No Scarring: No No Abnormalities Noted Maceration: No N/A Periwound Skin Moisture: Dry/Scaly: No Hemosiderin Staining: Yes N/A Periwound Skin Color: Atrophie Blanche: No Cyanosis: No Ecchymosis: No Erythema: No Mottled: No Pallor: No Rubor: No No Abnormality No Abnormality N/A Temperature: Debridement Debridement N/A Procedures Performed: Treatment Notes Electronic Signature(s) Signed: 05/01/2023 12:32:20 PM By: Geralyn Corwin DO Entered By: Geralyn Corwin on 05/01/2023 07:45:57 -------------------------------------------------------------------------------- Multi-Disciplinary Care Plan Details Patient Name: Date of Service: [name]. 05/01/2023 8:00 A M Medical Record Number: [id number] Patient Account Number: [id number] Date of Birth/Sex: Treating RN: 01/14/1928 (89 y.o. Katrinka Blazing Primary Care Jasmane Brockway: Seabron Spates Other Clinician: Referring Gautam Langhorst: Treating Vernesha Talbot/Extender: Ranee Gosselin in Treatment: 0 Active Inactive Wound/Skin Impairment Nursing Diagnoses: Impaired tissue integrity SHARIL, LABORIN ([id number])  [id number] Page 6 of 11 Goals: Patient/caregiver will verbalize understanding of skin care regimen Date Initiated: 05/01/2023 Target Resolution Date: 07/25/2023 Goal Status: Active Interventions: Assess ulceration(s) every visit Treatment Activities: Skin care regimen initiated : 05/01/2023 Notes: Electronic Signature(s) Signed: 05/01/2023 1:29:11 PM By: Karie Schwalbe RN Entered By: Karie Schwalbe on 05/01/2023 10:26:49 -------------------------------------------------------------------------------- Pain Assessment Details Patient Name: Date of Service: [name] 05/01/2023 8:00 A M Medical Record Number: [id number] Patient Account Number: [id number] Date of Birth/Sex: Treating RN: 21-Oct-1927 (89 y.o. Katrinka Blazing Primary Care Aeliana Spates: Seabron Spates Other Clinician: Referring Yaelis Scharfenberg: Treating Jams Trickett/Extender: Ranee Gosselin in Treatment: 0 Active Problems Location of Pain Severity and Description of Pain Patient Has Paino Yes Site Locations Pain Location: Generalized Pain With Dressing Change: No Duration of the Pain. Constant / Intermittento Constant Rate the pain. Current Pain Level: 7 Worst Pain Level: 10 Least Pain Level: 7 Tolerable Pain Level: 7 Character of Pain Describe the Pain: Difficult to Pinpoint Pain Management and Medication Current Pain Management: Medication: No Cold Application: No Rest: No Massage: No Activity: No T.E.N.S.: No Heat Application: No Leg drop or elevation: No Is the Current Pain Management Adequate: Inadequate How does your wound impact your activities of daily livingo Sleep: No Bathing: No Appetite: No Relationship With Others: No Bladder Continence: No Emotions: No Bowel Continence: No Work: No Toileting: No Drive: No Dressing: No  HobbiesSIRA, MIELKE ([id number]) [id number] Page 7 of 11 Electronic  Signature(s) Signed: 05/01/2023 1:29:11 PM By: Karie Schwalbe RN Entered By: Karie Schwalbe on 05/01/2023 06:21:10 -------------------------------------------------------------------------------- Patient/Caregiver Education Details Patient Name: Date of Service: [name] 9/6/2024andnbsp8:00 A M Medical Record Number: [id number] Patient Account Number: [id number] Date of Birth/Gender: Treating RN: 1928-06-13 (89 y.o. Katrinka Blazing Primary Care Physician: Seabron Spates Other Clinician: Referring Physician: Treating Physician/Extender: Ranee Gosselin in Treatment: 0 Education Assessment Education Provided To: Patient Education Topics Provided Wound/Skin Impairment: Methods: Explain/Verbal Responses: Return demonstration correctly Electronic Signature(s) Signed: 05/01/2023 1:29:11 PM By: Karie Schwalbe RN Entered By: Karie Schwalbe on 05/01/2023 10:26:57 -------------------------------------------------------------------------------- Wound Assessment Details Patient Name: Date of Service: [name] 05/01/2023 8:00 A M Medical Record Number: [id number] Patient Account Number: [id number] Date of Birth/Sex: Treating RN: 1928-01-30 (89 y.o. Katrinka Blazing Primary Care Janitza Revuelta: Seabron Spates Other Clinician: Referring Trinady Milewski: Treating Euriah Matlack/Extender: Ranee Gosselin in Treatment: 0 Wound Status Wound Number: 3 Primary Etiology: Trauma, Other Wound Location: Right, Proximal, Anterior Lower Leg Wound Status: Open Wounding Event: Hematoma Comorbid History: Cataracts, Anemia, Hypotension, Myocardial Infarction Date Acquired: 04/05/2023 Weeks Of Treatment: 0 Clustered Wound: No Photos ROZA, KORPI ([id number]) [id number] Page 8 of 11 Wound Measurements Length: (cm) 0.5 Width: (cm) 1.2 Depth: (cm) 0.1 Area: (cm) 0.471 Volume: (cm) 0.047 % Reduction in  Area: % Reduction in Volume: Epithelialization: Small (1-33%) Tunneling: No Undermining: No Wound Description Classification: Full Thickness Without Exposed Support Structures Wound Margin: Distinct, outline attached Exudate Amount: Medium Exudate Type: Serosanguineous Exudate Color: red, brown Foul Odor After Cleansing: No Slough/Fibrino No Wound Bed Granulation Amount: Small (1-33%) Exposed Structure Granulation Quality: Red Fascia Exposed: No Necrotic Amount: Large (67-100%) Fat Layer (Subcutaneous Tissue) Exposed: Yes Necrotic Quality: Eschar, Adherent Slough Tendon Exposed: No Muscle Exposed: No Joint Exposed: No Bone Exposed: No Periwound Skin Texture Texture Color No Abnormalities Noted: No No Abnormalities Noted: No Scarring: Yes Temperature / Pain Temperature: No Abnormality Moisture No Abnormalities Noted: Yes Treatment Notes Wound #3 (Lower Leg) Wound Laterality: Right, Anterior, Proximal Cleanser Soap and Water Discharge Instruction: May shower and wash wound with dial antibacterial soap and water prior to dressing change. Vashe 5.8 (oz) Discharge Instruction: Cleanse the wound with Vashe prior to applying a clean dressing using gauze sponges, not tissue or cotton balls. Peri-Wound Care Sween Lotion (Moisturizing lotion) Discharge Instruction: Apply moisturizing lotion as directed Topical Gentamicin Discharge Instruction: As directed by physician Mupirocin Ointment Discharge Instruction: Apply Mupirocin (Bactroban) as instructed Primary Dressing PolyMem Silver Non-Adhesive Dressing, 4.25x4.25 in Discharge Instruction: Apply to wound bed as instructed Secondary Dressing ABD Pad, 8x10 Discharge Instruction: Apply over primary dressing as directed. Woven Gauze Sponge, Non-Sterile 4x4 in Discharge Instruction: Apply over primary dressing as directed. MERRILEE, HERMS ([id number]) [id number] Page 9 of 11 Secured With Transpore  Surgical Tape, 2x10 (in/yd) Discharge Instruction: Secure dressing with tape as directed. Compression Wrap Kerlix Roll 4.5x3.1 (in/yd) Discharge Instruction: Apply Kerlix and Coban compression as directed. Coban Self-Adherent Wrap 4x5 (in/yd) Discharge Instruction: Apply over Kerlix as directed. Tubular netting #5 Compression Stockings Add-Ons Electronic Signature(s) Signed: 05/01/2023 1:29:11 PM By: Karie Schwalbe RN Entered By: Karie Schwalbe on 05/01/2023 05:45:33 -------------------------------------------------------------------------------- Wound Assessment Details Patient Name: Date of Service: [name] 05/01/2023 8:00 A M Medical Record Number: [id number] Patient Account Number: [id number] Date of Birth/Sex: Treating RN: April 15, 1928 (89 y.o. Katrinka Blazing Primary Care Dorene Bruni: Seabron Spates  Other Clinician: Referring Italo Banton: Treating Mitra Duling/Extender: Ranee Gosselin in Treatment: 0 Wound Status Wound Number: 4 Primary Etiology: Trauma, Other Wound Location: Right, Distal, Anterior Lower Leg Wound Status: Open Wounding Event: Hematoma Comorbid History: Cataracts, Anemia, Hypotension, Myocardial Infarction Date Acquired: 04/05/2023 Weeks Of Treatment: 0 Clustered Wound: No Photos Wound Measurements Length: (cm) 2.2 Width: (cm) 2 Depth: (cm) 0.1 Area: (cm) 3.456 Volume: (cm) 0.346 % Reduction in Area: % Reduction in Volume: Epithelialization: Small (1-33%) Tunneling: No Undermining: No Wound Description Classification: Full Thickness Without Exposed Support Wound Margin: Fibrotic scar, thickened scar Exudate Amount: Medium Exudate Type: Serosanguineous Exudate Color: red, brown Structures Foul Odor After Cleansing: No Slough/Fibrino Yes Wound Bed PERSEUS, TAKALA ([id number]) [id number] Page 10 of 11 Granulation Amount: Small (1-33%) Exposed Structure Granulation Quality: Red Fascia  Exposed: No Necrotic Amount: Large (67-100%) Fat Layer (Subcutaneous Tissue) Exposed: Yes Necrotic Quality: Eschar, Adherent Slough Tendon Exposed: No Muscle Exposed: No Joint Exposed: No Bone Exposed: No Periwound Skin Texture Texture Color No Abnormalities Noted: No No Abnormalities Noted: No Callus: No Atrophie Blanche: No Crepitus: No Cyanosis: No Excoriation: No Ecchymosis: No Induration: No Erythema: No Rash: No Hemosiderin Staining: Yes Scarring: No Mottled: No Pallor: No Moisture Rubor: No No Abnormalities Noted: Yes Temperature / Pain Temperature: No Abnormality Treatment Notes Wound #4 (Lower Leg) Wound Laterality: Right, Anterior, Distal Cleanser Soap and Water Discharge Instruction: May shower and wash wound with dial antibacterial soap and water prior to dressing change. Vashe 5.8 (oz) Discharge Instruction: Cleanse the wound with Vashe prior to applying a clean dressing using gauze sponges, not tissue or cotton balls. Peri-Wound Care Sween Lotion (Moisturizing lotion) Discharge Instruction: Apply moisturizing lotion as directed Topical Gentamicin Discharge Instruction: As directed by physician Mupirocin Ointment Discharge Instruction: Apply Mupirocin (Bactroban) as instructed Primary Dressing PolyMem Silver Non-Adhesive Dressing, 4.25x4.25 in Discharge Instruction: Apply to wound bed as instructed Secondary Dressing ABD Pad, 8x10 Discharge Instruction: Apply over primary dressing as directed. Woven Gauze Sponge, Non-Sterile 4x4 in Discharge Instruction: Apply over primary dressing as directed. Secured With Transpore Surgical Tape, 2x10 (in/yd) Discharge Instruction: Secure dressing with tape as directed. Compression Wrap Kerlix Roll 4.5x3.1 (in/yd) Discharge Instruction: Apply Kerlix and Coban compression as directed. Coban Self-Adherent Wrap 4x5 (in/yd) Discharge Instruction: Apply over Kerlix as directed. Tubular netting #5 Compression  Stockings Add-Ons Electronic Signature(s) Signed: 05/01/2023 1:29:11 PM By: Karie Schwalbe RN Entered By: Karie Schwalbe on 05/01/2023 05:47:42 MYESHA, MOFFATT ([id number]) [id number] Page 11 of 11 -------------------------------------------------------------------------------- Vitals Details Patient Name: Date of Service: [name] 05/01/2023 8:00 A M Medical Record Number: [id number] Patient Account Number: [id number] Date of Birth/Sex: Treating RN: 1928/06/01 (89 y.o. Katrinka Blazing Primary Care Rebecah Dangerfield: Seabron Spates Other Clinician: Referring Jose Alleyne: Treating Janeah Kovacich/Extender: Ranee Gosselin in Treatment: 0 Vital Signs Time Taken: 08:17 Temperature (F): 98.3 Height (in): 62 Pulse (bpm): 78 Weight (lbs): 85 Respiratory Rate (breaths/min): 14 Body Mass Index (BMI): 15.5 Blood Pressure (mmHg): 148/67 Reference Range: 80 - 120 mg / dl Electronic Signature(s) Signed: 05/01/2023 1:29:11 PM By: Karie Schwalbe RN Entered By: Karie Schwalbe on 05/01/2023 05:21:33

## 2023-05-01 NOTE — Progress Notes (Signed)
[name], NOURSE ([id number]) [id number] Page 1 of 11 Visit Report for 05/01/2023 Chief Complaint Document Details Patient Name: Date of Service: [name] 05/01/2023 8:00 A M Medical Record Number: [id number] Patient Account Number: [id number] Date of Birth/Sex: Treating RN: [birth year] (89 y.o. F) Primary Care Provider: Seabron Spates Other Clinician: Referring Provider: Treating Provider/Extender: Ranee Gosselin in Treatment: 0 Information Obtained from: Patient Chief Complaint 07/26/2018; patient is here for review of laceration injuries on the left anterior tibia area 05/01/2023; patient presents for right lower extremity wounds in the setting of trauma Electronic Signature(s) Signed: 05/01/2023 12:32:20 PM By: Geralyn Corwin DO Entered By: Geralyn Corwin on 05/01/2023 07:46:28 -------------------------------------------------------------------------------- Debridement Details Patient Name: Date of Service: Lindsey Dellen H. 05/01/2023 8:00 A M Medical Record Number: [id number] Patient Account Number: [id number] Date of Birth/Sex: Treating RN: 1927-10-05 (89 y.o. Katrinka Blazing Primary Care Provider: Seabron Spates Other Clinician: Referring Provider: Treating Provider/Extender: Ranee Gosselin in Treatment: 0 Debridement Performed for Assessment: Wound #3 Right,Proximal,Anterior Lower Leg Performed By: Physician Geralyn Corwin, DO Debridement Type: Debridement Level of Consciousness (Pre-procedure): Awake and Alert Pre-procedure Verification/Time Out Yes - 09:20 Taken: Start Time: 09:20 Pain Control: Lidocaine 4% T opical Solution Percent of Wound Bed Debrided: 100% T Area Debrided (cm): otal 0.47 Tissue and other material debrided: Viable, Non-Viable, Slough, Skin: Dermis , Skin: Epidermis, Slough Level: Skin/Epidermis Debridement Description: Selective/Open  Wound Instrument: Curette Bleeding: Minimum Hemostasis Achieved: Pressure End Time: 09:22 Procedural Pain: 0 Post Procedural Pain: 0 Response to Treatment: Procedure was tolerated [name] Level of Consciousness (Post- Awake and Alert procedure): Post Debridement Measurements of Total Wound Length: (cm) 0.5 Width: (cm) 1.2 Depth: (cm) 0.1 Volume: (cm) 0.047 Character of Wound/Ulcer Post Debridement: Improved TRESHA, VALDESPINO ([id number]) [id number] Page 2 of 11 Post Procedure Diagnosis Same as Pre-procedure Notes Scribed for Dr. Mikey Bussing by J.Scotton Electronic Signature(s) Signed: 05/01/2023 12:32:20 PM By: Geralyn Corwin DO Signed: 05/01/2023 1:29:11 PM By: Karie Schwalbe RN Entered By: Karie Schwalbe on 05/01/2023 06:22:46 -------------------------------------------------------------------------------- Debridement Details Patient Name: Date of Service: Lindsey Dellen H. 05/01/2023 8:00 A M Medical Record Number: [id number] Patient Account Number: [id number] Date of Birth/Sex: Treating RN: 1928-04-18 (89 y.o. Katrinka Blazing Primary Care Provider: Seabron Spates Other Clinician: Referring Provider: Treating Provider/Extender: Ranee Gosselin in Treatment: 0 Debridement Performed for Assessment: Wound #4 Right,Distal,Anterior Lower Leg Performed By: Physician Geralyn Corwin, DO Debridement Type: Debridement Level of Consciousness (Pre-procedure): Awake and Alert Pre-procedure Verification/Time Out Yes - 09:20 Taken: Start Time: 09:20 Pain Control: Lidocaine 4% T opical Solution Percent of Wound Bed Debrided: 100% T Area Debrided (cm): otal 3.45 Tissue and other material debrided: Viable, Non-Viable, Slough, Skin: Dermis , Skin: Epidermis, Slough Level: Skin/Epidermis Debridement Description: Selective/Open Wound Instrument: Curette Bleeding: Minimum Hemostasis Achieved: Pressure End Time: 09:22 Procedural  Pain: 0 Post Procedural Pain: 0 Response to Treatment: Procedure was tolerated [name] Level of Consciousness (Post- Awake and Alert procedure): Post Debridement Measurements of Total Wound Length: (cm) 2.2 Width: (cm) 2 Depth: (cm) 0.1 Volume: (cm) 0.346 Character of Wound/Ulcer Post Debridement: Improved Post Procedure Diagnosis Same as Pre-procedure Notes Scribed for Dr. Mikey Bussing by J.Scotton Electronic Signature(s) Signed: 05/01/2023 12:32:20 PM By: Geralyn Corwin DO Signed: 05/01/2023 1:29:11 PM By: Karie Schwalbe RN Entered By: Karie Schwalbe on 05/01/2023 06:23:29 KAMILAH, BEIER ([id number]) [id number] Page 3 of 11 -------------------------------------------------------------------------------- HPI Details Patient Name: Date of Service: [name], Lindsey Congress H. 05/01/2023 8:00 A  M Medical Record Number: [id number] Patient Account Number: [id number] Date of Birth/Sex: Treating RN: Jan 03, 1928 (89 y.o. F) Primary Care Provider: Seabron Spates Other Clinician: Referring Provider: Treating Provider/Extender: Ranee Gosselin in Treatment: 0 History of Present Illness HPI Description: ADMISSION 07/26/2018 This is an 89 year old woman who lives independently in her own apartment. 3 weeks ago she suffered a fall traumatizing her anterior lower legs against a small table. She developed laceration injuries on the left and right tibial areas. The area on the right has since healed. She has been seeing her primary physician at [facility]. She has had a round of Keflex and more recently a round of Levaquin for periwound cellulitis. According to her daughter she seemed to respond better to the Levaquin. I am not aware of any cultures. They have been using antibiotic cream, T and gauze to dress the wounds. As mentioned an area on elfa the right anterior tibia just below the tibial tuberosity has healed. Past medical history; the patient is  not a diabetic nor does she have any history of PAD. She has a history of anemia, depression, gastroesophageal reflux disease, bilateral hearing loss, hyperlipidemia, hypothyroidism, coronary artery artery disease osteoporosis and hypothyroidism she has a history of renal cell carcinoma the exact status of this is uncertain In our clinic her ABIs were very poor at 0.33 and 0.25. Her nurse reported that the waveforms were difficult to Doppler. Interestingly the patient describes before these wounds she would walk for 45 minutes a day without any suggestion of pain in her legs that would suggest claudication. 08/02/2018 the patient's wounds are smaller both on the left anterior tibia area. We have been using silver collagen under Kerlix and light Coban. After talking the patient sheath did talk about stopping when she walks suggesting some degree of possible claudication she is also complaining of heaviness in her legs. The history here is atypical but I think we should go ahead with formal arterial studies given how poor the ABIs were in our clinic. 08/09/2018; 2 areas on the left anterior tibia. Dimensions are slightly smaller. She still has not heard anything from vascular about arterial studies. We have been using Kerlix and light Coban over collagen 08/16/2018; 2 areas on the left anterior tibia, previous on the right has closed over. We have been using silver collagen under Kerlix and light Coban. Patient has her arterial studies on 08/16/2018, should be able to talk about this next week 08/23/2018; ARTERIAL studies showed noncompressible on the right but triphasic and biphasic waveforms at the PTA and DP respectively. TBI slightly reduced at 0.62. On the left again triphasic and biphasic waveforms at the PTA and DP respectively. ABI noncompressible. TBI slightly reduced at 0.61. She likely has some arterial disease. She does describe some pain in her legs with activity which forces her to stop.  This is with quite a bit of walking. Her wound is smaller by about half a centimeter in diameter. We are using [facility] health is changing 08/30/2018; arrives today with a wound not in a viable surface requiring debridement. Still using silver collagen. She does not have a major arterial issue 1/13; the patient's wound is not come down in size. Covered in eschar we have been using silver collagen. Intake nurse noted purulent drainage 1/20; the patient's wounds is half the size. We use silver alginate starting last time. Surface of the wound appears healthy 1/27; patient's wound only has a minuscule open area. We have been using  silver alginate under Kerlix Coban 2/3; patient's left leg is closed. These were initial traumatic wounds. She has some degree of chronic venous insufficiency with hemosiderin deposition. She did not have significant arterial studies by results we did in this clinic 05/01/2023 Ms. Briggitte Rosenbalm is a 89 year old female with a past medical history of COPD and venous insufficiency that presents to the clinic for a 1 month history of nonhealing wound to the right lower extremity. She reports hitting her shin against a stepstool creating a wound. She has been keeping the area covered. She has been following with her primary care physician for this issue and has been treated with Keflex and levofloxacin for this issue. She currently denies signs of infection. She does not wear compression stockings. Electronic Signature(s) Signed: 05/01/2023 12:32:20 PM By: Geralyn Corwin DO Entered By: Geralyn Corwin on 05/01/2023 07:50:24 -------------------------------------------------------------------------------- Physical Exam Details Patient Name: Date of Service: [name] 05/01/2023 8:00 A M Medical Record Number: [id number] Patient Account Number: [id number] Date of Birth/Sex: Treating RN: 1928-07-22 (89 y.o. F) Primary Care Provider: Seabron Spates Other  Clinician: Referring Provider: Treating Provider/Extender: Ranee Gosselin in Treatment: 0 Constitutional respirations regular, non-labored and within target range for patient.SHAKIELA, MINKOFF ([id number]) [id number] Page 4 of 11 Cardiovascular 2+ dorsalis pedis/posterior tibialis pulses. Psychiatric pleasant and cooperative. Notes Right lower extremity: T the anterior aspect there are 2 open wounds with nonviable tissue and scant granulation tissue. No signs of surrounding infection o including increased warmth, erythema or purulent drainage. Venous stasis dermatitis throughout the leg. 2+ pitting edema to the knee. Electronic Signature(s) Signed: 05/01/2023 12:32:20 PM By: Geralyn Corwin DO Entered By: Geralyn Corwin on 05/01/2023 07:51:18 -------------------------------------------------------------------------------- Physician Orders Details Patient Name: Date of Service: [name]. 05/01/2023 8:00 A M Medical Record Number: [id number] Patient Account Number: [id number] Date of Birth/Sex: Treating RN: Dec 14, 1927 (89 y.o. Katrinka Blazing Primary Care Provider: Seabron Spates Other Clinician: Referring Provider: Treating Provider/Extender: Ranee Gosselin in Treatment: 0 Verbal / Phone Orders: No Diagnosis Coding ICD-10 Coding Code Description [id number] Non-pressure chronic ulcer of other part of right lower leg with fat layer exposed I87.311 Chronic venous hypertension (idiopathic) with ulcer of right lower extremity J44.9 Chronic obstructive pulmonary disease, unspecified Follow-up Appointments ppointment in 1 week. - Dr. Mikey Bussing Room 7 or 9 Return A Anesthetic (In clinic) Topical Lidocaine 4% applied to wound bed Bathing/ Shower/ Hygiene May shower with protection but do not get wound dressing(s) wet. Protect dressing(s) with water repellant cover (for example, large  plastic bag) or a cast cover and may then take shower. - Please cover right leg if showering. Please keep the right leg wraps dry Edema Control - Lymphedema / SCD / Other Right Lower Extremity Elevate legs to the level of the heart or above for 30 minutes daily and/or when sitting for 3-4 times a day throughout the day. Avoid standing for long periods of time. Exercise regularly - As tolerated Wound Treatment Wound #3 - Lower Leg Wound Laterality: Right, Anterior, Proximal Cleanser: Soap and Water 1 x Per Week/30 Days Discharge Instructions: May shower and wash wound with dial antibacterial soap and water prior to dressing change. Cleanser: Vashe 5.8 (oz) 1 x Per Week/30 Days Discharge Instructions: Cleanse the wound with Vashe prior to applying a clean dressing using gauze sponges, not tissue or cotton balls. Peri-Wound Care: Sween Lotion (Moisturizing lotion) 1 x Per Week/30 Days Discharge Instructions: Apply moisturizing lotion as directed  Topical: Gentamicin 1 x Per Week/30 Days Discharge Instructions: As directed by physician Topical: Mupirocin Ointment 1 x Per Week/30 Days Discharge Instructions: Apply Mupirocin (Bactroban) as instructed Prim Dressing: PolyMem Silver Non-Adhesive Dressing, 4.25x4.25 in 1 x Per Week/30 Days ary Discharge Instructions: Apply to wound bed as instructed AAREN, MCINTOSH ([id number]) [id number] Page 5 of 11 Secondary Dressing: ABD Pad, 8x10 1 x Per Week/30 Days Discharge Instructions: Apply over primary dressing as directed. Secondary Dressing: Woven Gauze Sponge, Non-Sterile 4x4 in 1 x Per Week/30 Days Discharge Instructions: Apply over primary dressing as directed. Secured With: Transpore Surgical Tape, 2x10 (in/yd) 1 x Per Week/30 Days Discharge Instructions: Secure dressing with tape as directed. Compression Wrap: Kerlix Roll 4.5x3.1 (in/yd) 1 x Per Week/30 Days Discharge Instructions: Apply Kerlix and Coban compression  as directed. Compression Wrap: Coban Self-Adherent Wrap 4x5 (in/yd) 1 x Per Week/30 Days Discharge Instructions: Apply over Kerlix as directed. Compression Wrap: Tubular netting #5 1 x Per Week/30 Days Wound #4 - Lower Leg Wound Laterality: Right, Anterior, Distal Cleanser: Soap and Water 1 x Per Week/30 Days Discharge Instructions: May shower and wash wound with dial antibacterial soap and water prior to dressing change. Cleanser: Vashe 5.8 (oz) 1 x Per Week/30 Days Discharge Instructions: Cleanse the wound with Vashe prior to applying a clean dressing using gauze sponges, not tissue or cotton balls. Peri-Wound Care: Sween Lotion (Moisturizing lotion) 1 x Per Week/30 Days Discharge Instructions: Apply moisturizing lotion as directed Topical: Gentamicin 1 x Per Week/30 Days Discharge Instructions: As directed by physician Topical: Mupirocin Ointment 1 x Per Week/30 Days Discharge Instructions: Apply Mupirocin (Bactroban) as instructed Prim Dressing: PolyMem Silver Non-Adhesive Dressing, 4.25x4.25 in 1 x Per Week/30 Days ary Discharge Instructions: Apply to wound bed as instructed Secondary Dressing: ABD Pad, 8x10 1 x Per Week/30 Days Discharge Instructions: Apply over primary dressing as directed. Secondary Dressing: Woven Gauze Sponge, Non-Sterile 4x4 in 1 x Per Week/30 Days Discharge Instructions: Apply over primary dressing as directed. Secured With: Transpore Surgical Tape, 2x10 (in/yd) 1 x Per Week/30 Days Discharge Instructions: Secure dressing with tape as directed. Compression Wrap: Kerlix Roll 4.5x3.1 (in/yd) 1 x Per Week/30 Days Discharge Instructions: Apply Kerlix and Coban compression as directed. Compression Wrap: Coban Self-Adherent Wrap 4x5 (in/yd) 1 x Per Week/30 Days Discharge Instructions: Apply over Kerlix as directed. Compression Wrap: Tubular netting #5 1 x Per Week/30 Days Electronic Signature(s) Signed: 05/01/2023 12:32:20 PM By: Geralyn Corwin DO Entered By:  Geralyn Corwin on 05/01/2023 07:51:25 -------------------------------------------------------------------------------- Problem List Details Patient Name: Date of Service: [name], Lindsey Robinsons. 05/01/2023 8:00 A M Medical Record Number: [id number] Patient Account Number: [id number] Date of Birth/Sex: Treating RN: June 05, 1928 (89 y.o. F) Primary Care Provider: Seabron Spates Other Clinician: Referring Provider: Treating Provider/Extender: Ranee Gosselin in TreatmentSUMIYAH, PIGG ([id number]) [id number] Page 6 of 11 Active Problems ICD-10 Encounter Code Description Active Date MDM Diagnosis L97.812 Non-pressure chronic ulcer of other part of right lower leg with fat layer 05/01/2023 No Yes exposed I87.311 Chronic venous hypertension (idiopathic) with ulcer of right lower extremity 05/01/2023 No Yes J44.9 Chronic obstructive pulmonary disease, unspecified 05/01/2023 No Yes T79.8XXA Other early complications of trauma, initial encounter 05/01/2023 No Yes Inactive Problems Resolved Problems Electronic Signature(s) Signed: 05/01/2023 12:32:20 PM By: Geralyn Corwin DO Entered By: Geralyn Corwin on 05/01/2023 07:45:52 -------------------------------------------------------------------------------- Progress Note Details Patient Name: Date of Service: [name], Lindsey Robinsons. 05/01/2023 8:00 A M Medical Record Number: [id number] Patient Account Number: [id number]  Date of Birth/Sex: Treating RN: [birth year] (89 y.o. F) Primary Care Provider: Seabron Spates Other Clinician: Referring Provider: Treating Provider/Extender: Ranee Gosselin in Treatment: 0 Subjective Chief Complaint Information obtained from Patient 07/26/2018; patient is here for review of laceration injuries on the left anterior tibia area 05/01/2023; patient presents for right lower extremity wounds in the setting of trauma History of Present  Illness (HPI) ADMISSION 07/26/2018 This is an 89 year old woman who lives independently in her own apartment. 3 weeks ago she suffered a fall traumatizing her anterior lower legs against a small table. She developed laceration injuries on the left and right tibial areas. The area on the right has since healed. She has been seeing her primary physician at [facility]. She has had a round of Keflex and more recently a round of Levaquin for periwound cellulitis. According to her daughter she seemed to respond better to the Levaquin. I am not aware of any cultures. They have been using antibiotic cream, T and gauze to dress the wounds. As mentioned an area on elfa the right anterior tibia just below the tibial tuberosity has healed. Past medical history; the patient is not a diabetic nor does she have any history of PAD. She has a history of anemia, depression, gastroesophageal reflux disease, bilateral hearing loss, hyperlipidemia, hypothyroidism, coronary artery artery disease osteoporosis and hypothyroidism she has a history of renal cell carcinoma the exact status of this is uncertain In our clinic her ABIs were very poor at 0.33 and 0.25. Her nurse reported that the waveforms were difficult to Doppler. Interestingly the patient describes before these wounds she would walk for 45 minutes a day without any suggestion of pain in her legs that would suggest claudication. 08/02/2018 the patient's wounds are smaller both on the left anterior tibia area. We have been using silver collagen under Kerlix and light Coban. After talking the patient sheath did talk about stopping when she walks suggesting some degree of possible claudication she is also complaining of heaviness in her legs. The history here is atypical but I think we should go ahead with formal arterial studies given how poor the ABIs were in our clinic. 08/09/2018; 2 areas on the left anterior tibia. Dimensions are slightly smaller. She still has  not heard anything from vascular about arterial studies. We have been using Kerlix and light Coban over collagen 08/16/2018; 2 areas on the left anterior tibia, previous on the right has closed over. We have been using silver collagen under Kerlix and light Coban. Patient DANETTE, BILA ([id number]) [id number] Page 7 of 11 has her arterial studies on 08/16/2018, should be able to talk about this next week 08/23/2018; ARTERIAL studies showed noncompressible on the right but triphasic and biphasic waveforms at the PTA and DP respectively. TBI slightly reduced at 0.62. On the left again triphasic and biphasic waveforms at the PTA and DP respectively. ABI noncompressible. TBI slightly reduced at 0.61. She likely has some arterial disease. She does describe some pain in her legs with activity which forces her to stop. This is with quite a bit of walking. Her wound is smaller by about half a centimeter in diameter. We are using [facility] health is changing 08/30/2018; arrives today with a wound not in a viable surface requiring debridement. Still using silver collagen. She does not have a major arterial issue 1/13; the patient's wound is not come down in size. Covered in eschar we have been using silver collagen. Intake nurse noted purulent drainage  1/20; the patient's wounds is half the size. We use silver alginate starting last time. Surface of the wound appears healthy 1/27; patient's wound only has a minuscule open area. We have been using silver alginate under Kerlix Coban 2/3; patient's left leg is closed. These were initial traumatic wounds. She has some degree of chronic venous insufficiency with hemosiderin deposition. She did not have significant arterial studies by results we did in this clinic 05/01/2023 Ms. [name] is a 89 year old female with a past medical history of COPD and venous insufficiency that presents to the clinic for a 1 month history  of nonhealing wound to the right lower extremity. She reports hitting her shin against a stepstool creating a wound. She has been keeping the area covered. She has been following with her primary care physician for this issue and has been treated with Keflex and levofloxacin for this issue. She currently denies signs of infection. She does not wear compression stockings. Patient History Information obtained from Patient, Chart. Allergies trazodone, codeine, clarithromycin, diazepam, Sulfa (Sulfonamide Antibiotics), Prilosec Family History Cancer - Siblings, Heart Disease - Mother, Hypertension - Mother, Kidney Disease - Siblings, No family history of Diabetes, Hereditary Spherocytosis, Lung Disease, Seizures, Stroke, Thyroid Problems, Tuberculosis. Social History Former smoker - quit 60 years ago, Marital Status - Widowed, Alcohol Use - Never, Drug Use - No History, Caffeine Use - Daily - coffee. Medical History Eyes Patient has history of Cataracts - removed X2 Hematologic/Lymphatic Patient has history of Anemia - s/p heart surgery Cardiovascular Patient has history of Hypotension, Myocardial Infarction Integumentary (Skin) Denies history of History of Burn Musculoskeletal Denies history of Gout, Rheumatoid Arthritis, Osteoarthritis, Osteomyelitis Oncologic Denies history of Received Chemotherapy, Received Radiation Hospitalization/Surgery History - renal mass ablation. - syncope. 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 Vitals Time Taken: 8:17 AM, Height: 62 in, Weight: 85 lbs, BMI: 15.5, Temperature: 98.3 F, Pulse: 78 bpm, Respiratory Rate: 14 breaths/min, Blood Pressure: 148/67 mmHg. Cardiovascular 2+ dorsalis pedis/posterior tibialis pulses. Psychiatric pleasant and cooperative. KARESA, SITZER ([id number]) [id number] Page 8 of 11 General Notes: Right lower extremity: T the anterior aspect there are 2 open wounds with nonviable tissue and scant granulation tissue. No signs of o surrounding infection including increased warmth, erythema or purulent drainage. Venous stasis dermatitis throughout the leg. 2+ pitting edema to the knee. Integumentary (Hair, Skin) Wound #3 status is Open. Original cause of wound was Hematoma. The date acquired was: 04/05/2023. The wound is located on the Right,Proximal,Anterior Lower Leg. The wound measures 0.5cm length x 1.2cm width x 0.1cm depth; 0.471cm^2 area and 0.047cm^3 volume. There is Fat Layer (Subcutaneous Tissue) exposed. There is no tunneling or undermining noted. There is a medium amount of serosanguineous drainage noted. The wound margin is distinct with the outline attached to the wound base. There is small (1-33%) red granulation within the wound bed. There is a large (67-100%) amount of necrotic tissue within the wound bed including Eschar and Adherent Slough. The periwound skin appearance had no abnormalities noted for moisture. The periwound skin appearance exhibited: Scarring. Periwound temperature  was noted as No Abnormality. Wound #4 status is Open. Original cause of wound was Hematoma. The date acquired was: 04/05/2023. The wound is located on the Right,Distal,Anterior Lower Leg. The wound measures 2.2cm length x 2cm width x 0.1cm depth; 3.456cm^2 area and 0.346cm^3 volume. There is Fat Layer (Subcutaneous  Tissue) exposed. There is no tunneling or undermining noted. There is a medium amount of serosanguineous drainage noted. The wound margin is fibrotic, thickened scar. There is small (1-33%) red granulation within the wound bed. There is a large (67-100%) amount of necrotic tissue within the wound bed including Eschar and Adherent Slough. The periwound skin appearance had no abnormalities noted for moisture. The periwound skin appearance exhibited: Hemosiderin Staining. The periwound skin appearance did not exhibit: Callus, Crepitus, Excoriation, Induration, Rash, Scarring, Atrophie Blanche, Cyanosis, Ecchymosis, Mottled, Pallor, Rubor, Erythema. Periwound temperature was noted as No Abnormality. Assessment Active Problems ICD-10 Non-pressure chronic ulcer of other part of right lower leg with fat layer exposed Chronic venous hypertension (idiopathic) with ulcer of right lower extremity Chronic obstructive pulmonary disease, unspecified Other early complications of trauma, initial encounter Patient presents with a 1 month history of nonhealing wound to the right lower extremity in the setting of trauma and nonhealing due to venous insufficiency. No signs of infection on exam. I debrided nonviable tissue. We discussed the importance of edema control for wound healing. Her ABIs in office were noncompressible however she has palpable pedal pulses and should have adequate blood flow for healing. She would benefit from a wrap for her edema control and we will do a light compression wrap with Kerlix/Coban. Under the wrap I recommended antibiotic ointment and PolyMem silver. She knows to not get this wet or keep this on for more than 1 week. Follow-up in 1 week. She knows to call with any questions or concerns. Procedures Wound #3 Pre-procedure diagnosis of Wound #3 is a Trauma, Other located on the Right,Proximal,Anterior Lower Leg . There was a Selective/Open Wound Skin/Epidermis Debridement with a  total area of 0.47 sq cm performed by Geralyn Corwin, DO. With the following instrument(s): Curette to remove Viable and Non-Viable tissue/material. Material removed includes Slough, Skin: Dermis, and Skin: Epidermis after achieving pain control using Lidocaine 4% Topical Solution. No specimens were taken. A time out was conducted at 09:20, prior to the start of the procedure. A Minimum amount of bleeding was controlled with Pressure. The procedure was tolerated [name] with a pain level of 0 throughout and a pain level of 0 following the procedure. Post Debridement Measurements: 0.5cm length x 1.2cm width x 0.1cm depth; 0.047cm^3 volume. Character of Wound/Ulcer Post Debridement is improved. Post procedure Diagnosis Wound #3: Same as Pre-Procedure General Notes: Scribed for Dr. Mikey Bussing by J.Scotton. Wound #4 Pre-procedure diagnosis of Wound #4 is a Trauma, Other located on the Right,Distal,Anterior Lower Leg . There was a Selective/Open Wound Skin/Epidermis Debridement with a total area of 3.45 sq cm performed by Geralyn Corwin, DO. With the following instrument(s): Curette to remove Viable and Non-Viable tissue/material. Material removed includes Slough, Skin: Dermis, and Skin: Epidermis after achieving pain control using Lidocaine 4% Topical Solution. No specimens were taken. A time out was conducted at 09:20, prior to the start of the procedure. A Minimum amount of bleeding was controlled with Pressure. The procedure was tolerated [name] with a pain level of 0 throughout and a pain level of 0 following the procedure. Post Debridement Measurements: 2.2cm length x 2cm width x 0.1cm depth; 0.346cm^3 volume. Character of Wound/Ulcer Post  Debridement is improved. Post procedure Diagnosis Wound #4: Same as Pre-Procedure General Notes: Scribed for Dr. Mikey Bussing by J.Scotton. Plan Follow-up Appointments: Return Appointment in 1 week. - Dr. Mikey Bussing Room 7 or 9 Anesthetic: (In clinic) Topical Lidocaine  4% applied to wound bed Bathing/ Shower/ Hygiene: May shower with protection but do not get wound dressing(s) wet. Protect dressing(s) with water repellant cover (for example, large plastic bag) or a cast cover and may then take shower. - Please cover right leg if showering. Please keep the right leg wraps dry Edema Control - Lymphedema / SCD / Other: Elevate legs to the level of the heart or above for 30 minutes daily and/or when sitting for 3-4 times a day throughout the day. Avoid standing for long periods of time. Exercise regularly - As tolerated WOUND #3: - Lower Leg Wound Laterality: Right, Anterior, Proximal Cleanser: Soap and Water 1 x Per Week/30 Days JOYLEEN, RIEFF ([id number]) [id number] Page 9 of 11 Discharge Instructions: May shower and wash wound with dial antibacterial soap and water prior to dressing change. Cleanser: Vashe 5.8 (oz) 1 x Per Week/30 Days Discharge Instructions: Cleanse the wound with Vashe prior to applying a clean dressing using gauze sponges, not tissue or cotton balls. Peri-Wound Care: Sween Lotion (Moisturizing lotion) 1 x Per Week/30 Days Discharge Instructions: Apply moisturizing lotion as directed Topical: Gentamicin 1 x Per Week/30 Days Discharge Instructions: As directed by physician Topical: Mupirocin Ointment 1 x Per Week/30 Days Discharge Instructions: Apply Mupirocin (Bactroban) as instructed Prim Dressing: PolyMem Silver Non-Adhesive Dressing, 4.25x4.25 in 1 x Per Week/30 Days ary Discharge Instructions: Apply to wound bed as instructed Secondary Dressing: ABD Pad, 8x10 1 x Per Week/30 Days Discharge Instructions: Apply over primary dressing as directed. Secondary Dressing: Woven Gauze Sponge, Non-Sterile 4x4 in 1 x Per Week/30 Days Discharge Instructions: Apply over primary dressing as directed. Secured With: Transpore Surgical T ape, 2x10 (in/yd) 1 x Per Week/30 Days Discharge Instructions: Secure dressing with  tape as directed. Com pression Wrap: Kerlix Roll 4.5x3.1 (in/yd) 1 x Per Week/30 Days Discharge Instructions: Apply Kerlix and Coban compression as directed. Com pression Wrap: Coban Self-Adherent Wrap 4x5 (in/yd) 1 x Per Week/30 Days Discharge Instructions: Apply over Kerlix as directed. Com pression Wrap: Tubular netting #5 1 x Per Week/30 Days WOUND #4: - Lower Leg Wound Laterality: Right, Anterior, Distal Cleanser: Soap and Water 1 x Per Week/30 Days Discharge Instructions: May shower and wash wound with dial antibacterial soap and water prior to dressing change. Cleanser: Vashe 5.8 (oz) 1 x Per Week/30 Days Discharge Instructions: Cleanse the wound with Vashe prior to applying a clean dressing using gauze sponges, not tissue or cotton balls. Peri-Wound Care: Sween Lotion (Moisturizing lotion) 1 x Per Week/30 Days Discharge Instructions: Apply moisturizing lotion as directed Topical: Gentamicin 1 x Per Week/30 Days Discharge Instructions: As directed by physician Topical: Mupirocin Ointment 1 x Per Week/30 Days Discharge Instructions: Apply Mupirocin (Bactroban) as instructed Prim Dressing: PolyMem Silver Non-Adhesive Dressing, 4.25x4.25 in 1 x Per Week/30 Days ary Discharge Instructions: Apply to wound bed as instructed Secondary Dressing: ABD Pad, 8x10 1 x Per Week/30 Days Discharge Instructions: Apply over primary dressing as directed. Secondary Dressing: Woven Gauze Sponge, Non-Sterile 4x4 in 1 x Per Week/30 Days Discharge Instructions: Apply over primary dressing as directed. Secured With: Transpore Surgical T ape, 2x10 (in/yd) 1 x Per Week/30 Days Discharge Instructions: Secure dressing with tape as directed. Com pression Wrap: Kerlix Roll 4.5x3.1 (in/yd) 1 x Per Week/30 Days  Discharge Instructions: Apply Kerlix and Coban compression as directed. Com pression Wrap: Coban Self-Adherent Wrap 4x5 (in/yd) 1 x Per Week/30 Days Discharge Instructions: Apply over Kerlix as  directed. Com pression Wrap: Tubular netting #5 1 x Per Week/30 Days 1. In office sharp debridement 2. Antibiotic ointment with PolyMem silver under Kerlix/Coban 3. Follow-up in 1 week Electronic Signature(s) Signed: 05/01/2023 12:32:20 PM By: Geralyn Corwin DO Entered By: Geralyn Corwin on 05/01/2023 07:55:09 -------------------------------------------------------------------------------- HxROS Details Patient Name: Date of Service: [name], Lindsey Congress H. 05/01/2023 8:00 A M Medical Record Number: [id number] Patient Account Number: [id number] Date of Birth/Sex: Treating RN: 06-26-1928 (89 y.o. 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 ; partial hearing with hearing aide on the left Hematologic/Lymphatic Medical History: Positive for: Anemia - s/p heart surgery Cardiovascular Medical History: Positive for: Hypotension; Myocardial Infarction Past Medical History Notes: atherosclerosis Endocrine Medical History: Past Medical History Notes: hypothyroid Integumentary (Skin) Medical History: Negative for: History of Burn Past Medical History Notes: wound of right Lower leg Musculoskeletal Medical History: Negative for: Gout; Rheumatoid Arthritis; Osteoarthritis; Osteomyelitis Oncologic Medical History: Negative for: Received Chemotherapy; Received Radiation Past Medical History Notes: 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 (no chemo or radiation for this) Psychiatric Medical History: Past Medical History Notes: depression HBO Extended History Items Eyes: Cataracts Immunizations Pneumococcal Vaccine: Received Pneumococcal Vaccination: Yes Received Pneumococcal Vaccination On or After 60th Birthday: Yes Tetanus Vaccine: Last tetanus shot: 06/08/2016 Implantable Devices None Hospitalization / Surgery History Type of Hospitalization/Surgery renal mass ablation syncope Family and Social History Cancer: Yes - Siblings; Diabetes: No; Heart Disease: Yes - Mother; Hereditary Spherocytosis: No; Hypertension: Yes - Mother; Kidney Disease: Yes - Siblings; Lung Disease: No; Seizures: No; Stroke: No; Thyroid Problems: No; Tuberculosis: No; Former smoker - quit 60 years ago; Marital Status - Widowed; Alcohol Use: Never; Drug Use: No History; Caffeine Use: Daily - coffee; Financial Concerns: No; Food, Clothing or Shelter Needs: No; Support System Lacking: No; Transportation Concerns: No KEYDRA, OSORNO ([id number]) [id number] Page 11 of 11 Electronic Signature(s) Signed: 05/01/2023 12:32:20 PM By: Geralyn Corwin DO Signed: 05/01/2023 1:29:11 PM By: Karie Schwalbe RN Entered By: Karie Schwalbe on 05/01/2023 05:23:45 -------------------------------------------------------------------------------- SuperBill Details Patient Name: Date of Service: [name] 05/01/2023 Medical Record Number: [id number] Patient Account Number: [id number] Date of Birth/Sex: Treating RN: 12-20-1927 (89 y.o. Katrinka Blazing Primary Care Provider: Seabron Spates Other Clinician: Referring Provider: Treating Provider/Extender: Ranee Gosselin in Treatment: 0 Diagnosis Coding ICD-10 Codes Code Description [phone] Non-pressure chronic ulcer of other part of right lower leg with fat layer exposed I87.311 Chronic venous hypertension  (idiopathic) with ulcer of right lower extremity J44.9 Chronic obstructive pulmonary disease, unspecified Facility Procedures : CPT4 Code: 72536644 Description: 99214 - WOUND CARE VISIT-LEV 4 EST PT Modifier: Quantity: 1 : CPT4 Code: [id number] Description: 97597 - DEBRIDE WOUND 1ST 20 SQ CM OR < ICD-10 Diagnosis Description L97.812 Non-pressure chronic ulcer of other part of right lower leg with fat layer expos I87.311 Chronic venous hypertension (idiopathic) with ulcer of  right lower  extremity Modifier: ed Quantity: 1 Physician Procedures : CPT4 Code Description Modifier [id number] 99204 - WC PHYS LEVEL 4 - NEW PT 25 ICD-10 Diagnosis Description L97.812 Non-pressure chronic ulcer of other part of right lower leg with fat layer exposed I87.311 Chronic venous hypertension (idiopathic) with  ulcer of right lower extremity J44.9 Chronic obstructive pulmonary disease, unspecified Quantity: 1 : [id number] 97597 - WC PHYS DEBR WO ANESTH 20 SQ CM ICD-10 Diagnosis Description L97.812 Non-pressure chronic ulcer of other part of right lower leg with fat layer exposed I87.311 Chronic venous hypertension (idiopathic) with ulcer of right lower extremity Quantity: 1 Electronic Signature(s) Signed: 05/01/2023 12:32:20 PM By: Geralyn Corwin DO Entered By: Geralyn Corwin on 05/01/2023 07:55:27

## 2023-05-01 NOTE — Progress Notes (Signed)
RITHIKA, WYSS ([id number]) [id number] Page 1 of 4 Visit Report for 05/01/2023 Abuse Risk Screen Details Patient Name: Date of Service: [name] 05/01/2023 8:00 A M Medical Record Number: [id number] Patient Account Number: [id number] Date of Birth/Sex: Treating RN: [birth year] (89 y.o. Katrinka Blazing Primary Care Burlene Montecalvo: Seabron Spates Other Clinician: Referring Dannika Hilgeman: Treating Amere Iott/Extender: Ranee Gosselin in Treatment: 0 Abuse Risk Screen Items Answer ABUSE RISK SCREEN: Has anyone close to you tried to hurt or harm you recentlyo No Do you feel uncomfortable with anyone in your familyo No Has anyone forced you do things that you didnt want to doo No Electronic Signature(s) Signed: 05/01/2023 1:29:11 PM By: Karie Schwalbe RN Entered By: Karie Schwalbe on 05/01/2023 05:23:57 -------------------------------------------------------------------------------- Activities of Daily Living Details Patient Name: Date of Service: [name] 05/01/2023 8:00 A M Medical Record Number: [id number] Patient Account Number: [id number] Date of Birth/Sex: Treating RN: 01/03/1928 (89 y.o. Katrinka Blazing Primary Care Keera Altidor: Seabron Spates Other Clinician: Referring Chelcie Estorga: Treating Sharron Simpson/Extender: Ranee Gosselin in Treatment: 0 Activities of Daily Living Items Answer Activities of Daily Living (Please select one for each item) Drive Automobile Not Able T Medications ake Completely Able Use T elephone Completely Able Care for Appearance Completely Able Use T oilet Completely Able Bath / Shower Completely Able Dress Self Completely Able Feed Self Completely Able Walk Need Assistance Get In / Out Bed Completely Able Housework Completely Able Prepare Meals Completely Able Handle Money Completely Able Shop for Self Need Assistance Electronic  Signature(s) Signed: 05/01/2023 1:29:11 PM By: Karie Schwalbe RN Entered By: Karie Schwalbe on 05/01/2023 05:26:51 [name], [name] ([id number]) [id number] Page 2 of 4 -------------------------------------------------------------------------------- Education Screening Details Patient Name: Date of Service: [name] 05/01/2023 8:00 A M Medical Record Number: [id number] Patient Account Number: [id number] Date of Birth/Sex: Treating RN: 12/10/1927 (89 y.o. Katrinka Blazing Primary Care Jeanene Mena: Seabron Spates Other Clinician: Referring Chrys Landgrebe: Treating Merwin Breden/Extender: Ranee Gosselin in Treatment: 0 Primary Learner Assessed: Patient Learning Preferences/Education Level/Primary Language Learning Preference: Explanation, Demonstration, Printed Material Highest Education Level: High School Preferred Language: English Cognitive Barrier Language Barrier: No Translator Needed: No Memory Deficit: No Emotional Barrier: No Cultural/Religious Beliefs Affecting Medical Care: No Physical Barrier Impaired Vision: No Impaired Hearing: Yes Complete Loss, Hearing Aid, Left Ear(complete hearing loss) Right ear hearing aid Decreased Hand dexterity: No Knowledge/Comprehension Knowledge Level: High Comprehension Level: High Ability to understand written instructions: High Ability to understand verbal instructions: High Motivation Anxiety Level: Calm Cooperation: Cooperative Education Importance: Acknowledges Need Interest in Health Problems: Asks Questions Perception: Coherent Willingness to Engage in Self-Management High Activities: Readiness to Engage in Self-Management High Activities: Electronic Signature(s) Signed: 05/01/2023 1:29:11 PM By: Karie Schwalbe RN Entered By: Karie Schwalbe on 05/01/2023 05:28:57 -------------------------------------------------------------------------------- Fall Risk Assessment  Details Patient Name: Date of Service: [name], Lindsey Robinsons. 05/01/2023 8:00 A M Medical Record Number: [id number] Patient Account Number: [id number] Date of Birth/Sex: Treating RN: February 06, 1928 (89 y.o. Katrinka Blazing Primary Care Harel Repetto: Seabron Spates Other Clinician: Referring Ellasyn Swilling: Treating Mekaela Azizi/Extender: Ranee Gosselin in Treatment: 0 Fall Risk Assessment Items Have you had 2 or more falls in the last 12 monthso 0 No [name], [name] ([id number]) [id number] Page 3 of 4 Have you had any fall that resulted in injury in the last 12 monthso 0 Yes FALLS RISK SCREEN History of falling - immediate or within 3 months  0 No Secondary diagnosis (Do you have 2 or more medical diagnoseso) 0 No Ambulatory aid None/bed rest/wheelchair/nurse 0 No Crutches/cane/walker 15 Yes Furniture 0 No Intravenous therapy Access/Saline/Heparin Lock 0 No Gait/Transferring Normal/ bed rest/ wheelchair 0 No Weak (short steps with or without shuffle, stooped but able to lift head while walking, may seek 10 Yes support from furniture) Impaired (short steps with shuffle, may have difficulty arising from chair, head down, impaired 0 No balance) Mental Status Oriented to own ability 0 No Electronic Signature(s) Signed: 05/01/2023 1:29:11 PM By: Karie Schwalbe RN Entered By: Karie Schwalbe on 05/01/2023 05:29:23 -------------------------------------------------------------------------------- Foot Assessment Details Patient Name: Date of Service: [name]. 05/01/2023 8:00 A M Medical Record Number: [id number] Patient Account Number: [id number] Date of Birth/Sex: Treating RN: 01-Jun-1928 (89 y.o. Katrinka Blazing Primary Care Bryce Kimble: Seabron Spates Other Clinician: Referring Dallin Mccorkel: Treating Ennis Delpozo/Extender: Ranee Gosselin in Treatment: 0 Foot Assessment Items Site Locations + = Sensation  present, - = Sensation absent, C = Callus, U = Ulcer R = Redness, W = Warmth, M = Maceration, PU = Pre-ulcerative lesion F = Fissure, S = Swelling, D = Dryness Assessment Right: Left: Other Deformity: No No Prior Foot Ulcer: No No Prior Amputation: No No Charcot Joint: No No Ambulatory Status: Ambulatory With Help Assistance Device: Wheelchair [name], [name] ([id number]) [id number] Page 4 of 4 Gait: Steady Electronic Signature(s) Signed: 05/01/2023 1:29:11 PM By: Karie Schwalbe RN Entered By: Karie Schwalbe on 05/01/2023 06:16:22 -------------------------------------------------------------------------------- Nutrition Risk Screening Details Patient Name: Date of Service: [name] 05/01/2023 8:00 A M Medical Record Number: [id number] Patient Account Number: [id number] Date of Birth/Sex: Treating RN: 12-Jun-1928 (89 y.o. Katrinka Blazing Primary Care Daking Westervelt: Seabron Spates Other Clinician: Referring Cleavon Goldman: Treating Marleena Shubert/Extender: Ranee Gosselin in Treatment: 0 Height (in): 62 Weight (lbs): 85 Body Mass Index (BMI): 15.5 Nutrition Risk Screening Items Score Screening NUTRITION RISK SCREEN: I have an illness or condition that made me change the kind and/or amount of food I eat 0 No I eat fewer than two meals per day 0 No I eat few fruits and vegetables, or milk products 0 No I have three or more drinks of beer, liquor or wine almost every day 0 No I have tooth or mouth problems that make it hard for me to eat 0 No I don't always have enough money to buy the food I need 0 No I eat alone most of the time 0 No I take three or more different prescribed or over-the-counter drugs a day 0 No Without wanting to, I have lost or gained 10 pounds in the last six months 0 No I am not always physically able to shop, cook and/or feed myself 0 No Nutrition Protocols Good Risk Protocol 0 No interventions  needed Moderate Risk Protocol High Risk Proctocol Risk Level: Good Risk Score: 0 Electronic Signature(s) Signed: 05/01/2023 1:29:11 PM By: Karie Schwalbe RN Entered By: Karie Schwalbe on 05/01/2023 05:30:16

## 2023-05-08 ENCOUNTER — Encounter (HOSPITAL_BASED_OUTPATIENT_CLINIC_OR_DEPARTMENT_OTHER): Payer: Medicare Other | Admitting: Internal Medicine

## 2023-05-08 DIAGNOSIS — Z87891 Personal history of nicotine dependence: Secondary | ICD-10-CM | POA: Diagnosis not present

## 2023-05-08 DIAGNOSIS — I2510 Atherosclerotic heart disease of native coronary artery without angina pectoris: Secondary | ICD-10-CM | POA: Diagnosis not present

## 2023-05-08 DIAGNOSIS — J449 Chronic obstructive pulmonary disease, unspecified: Secondary | ICD-10-CM | POA: Diagnosis not present

## 2023-05-08 DIAGNOSIS — L97812 Non-pressure chronic ulcer of other part of right lower leg with fat layer exposed: Secondary | ICD-10-CM | POA: Diagnosis not present

## 2023-05-08 DIAGNOSIS — T798XXA Other early complications of trauma, initial encounter: Secondary | ICD-10-CM | POA: Diagnosis not present

## 2023-05-08 DIAGNOSIS — I87311 Chronic venous hypertension (idiopathic) with ulcer of right lower extremity: Secondary | ICD-10-CM

## 2023-05-08 DIAGNOSIS — W2203XA Walked into furniture, initial encounter: Secondary | ICD-10-CM | POA: Diagnosis not present

## 2023-05-08 NOTE — Progress Notes (Signed)
tissue or cotton balls. Peri-Wound Care: Sween Lotion (Moisturizing lotion) 1 x Per Week/30 Days Discharge Instructions: Apply moisturizing lotion as directed Topical: Gentamicin 1 x Per Week/30 Days Discharge Instructions: As directed by physician Topical: Mupirocin Ointment 1 x Per Week/30 Days Discharge Instructions: Apply Mupirocin (Bactroban) as instructed Prim Dressing: PolyMem Silver Non-Adhesive Dressing, 4.25x4.25 in 1 x Per Week/30 Days ary Discharge Instructions: Apply to wound bed as  instructed Secondary Dressing: ABD Pad, 8x10 1 x Per Week/30 Days Discharge Instructions: Apply over primary dressing as directed. Secondary Dressing: Woven Gauze Sponge, Non-Sterile 4x4 in 1 x Per Week/30 Days Discharge Instructions: Apply over primary dressing as directed. Secured With: Transpore Surgical T ape, 2x10 (in/yd) 1 x Per Week/30 Days Discharge Instructions: Secure dressing with tape as directed. Com pression Wrap: Kerlix Roll 4.5x3.1 (in/yd) 1 x Per Week/30 Days Discharge Instructions: Apply Kerlix and Coban compression as directed. Com pression Wrap: Coban Self-Adherent Wrap 4x5 (in/yd) 1 x Per Week/30 Days Discharge Instructions: Apply over Kerlix as directed. Com pression Wrap: Tubular netting #5 1 x Per Week/30 Days 1. PolyMem silver with antibiotic ointment under Kerlix/Coban to the right lower extremity 2. Follow-up in 1 week 3. In office sharp debridement Electronic Signature(s) Signed: 05/08/2023 12:33:51 PM By: Geralyn Corwin DO Entered By: Geralyn Corwin on 05/08/2023 10:25:55 -------------------------------------------------------------------------------- HxROS Details Patient Name: Date of Service: Lindsey [name]. 05/08/2023 9:30 A M Medical Record Number: [id number] Patient Account Number: [id number] Date of Birth/Sex: Treating RN: December 03, 1927 (89 y.o. F) Primary Care Provider: Seabron Spates Other Clinician: Referring Provider: Treating Provider/Extender: Ranee Gosselin in Treatment: 1 Information Obtained From Patient Chart Constitutional Symptoms (General Health) Medical History: Past Medical History NotesTRESSIE, [name] ([id number]) [id number] Page 9 of 10 almost bedridden per dgtr - difficult to stand up and sit down safely Eyes Medical History: Positive for: Cataracts - removed X2 Ear/Nose/Mouth/Throat Medical History: Past Medical History Notes: totally deaf on left ; partial  hearing with hearing aide on the left Hematologic/Lymphatic Medical History: Positive for: Anemia - s/p heart surgery Cardiovascular Medical History: Positive for: Hypotension; Myocardial Infarction Past Medical History Notes: atherosclerosis Endocrine Medical History: Past Medical History Notes: hypothyroid Integumentary (Skin) Medical History: Negative for: History of Burn Past Medical History Notes: wound of right Lower leg Musculoskeletal Medical History: Negative for: Gout; Rheumatoid Arthritis; Osteoarthritis; Osteomyelitis Oncologic Medical History: Negative for: Received Chemotherapy; Received Radiation Past Medical History Notes: 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 (no chemo or radiation for this) Psychiatric Medical History: Past Medical History Notes: depression HBO Extended History Items Eyes: Cataracts Immunizations Pneumococcal Vaccine: Received Pneumococcal Vaccination: Yes Received Pneumococcal Vaccination On or After 60th Birthday: Yes Tetanus Vaccine: Last tetanus shot: 06/08/2016 Implantable Devices None Hospitalization / Surgery History Type of Hospitalization/Surgery renal mass ablation syncope Family and Social History Cancer: Yes - Siblings; Diabetes: No; Heart Disease: Yes - Mother; Hereditary Spherocytosis: No; Hypertension: Yes - Mother; Kidney DiseaseMEDIA, [name] ([id number]) [id number] Page 10 of 10 Siblings; Lung Disease: No; Seizures: No; Stroke: No; Thyroid Problems: No; Tuberculosis: No; Former smoker - quit 60 years ago; Marital Status - Widowed; Alcohol Use: Never; Drug Use: No History; Caffeine Use: Daily - coffee; Financial Concerns: No; Food, Clothing or Shelter Needs: No; Support System Lacking: No; Transportation Concerns: No Electronic Signature(s) Signed: 05/08/2023 12:33:51 PM By: Geralyn Corwin DO Entered By: Geralyn Corwin on 05/08/2023  10:23:24 -------------------------------------------------------------------------------- SuperBill Details Patient Name: Date of Service: [name],  ABIs were very poor at 0.33 and 0.25. Her nurse reported that the waveforms were difficult to Doppler. Interestingly the patient describes before these wounds she would walk for 45 minutes a day without any suggestion of pain in her legs that would suggest claudication. 08/02/2018 the patient's wounds are smaller both on the left anterior tibia area. We have been using silver collagen under Kerlix and light Coban. After talking the patient sheath did talk about stopping when she walks suggesting some degree of possible claudication she is also complaining of heaviness in her legs. The history here is atypical but I think we should go ahead with formal arterial studies given how poor the ABIs were in our clinic. 08/09/2018; 2 areas on the left anterior tibia. Dimensions are slightly smaller. She still has not heard anything from vascular about arterial studies. We have been using Kerlix and light Coban over collagen 08/16/2018; 2 areas on the left anterior tibia, previous on the right has closed over. We have been using silver collagen under Kerlix and light Coban. Patient has her arterial studies on 08/16/2018, should be able to talk about this next week 08/23/2018; ARTERIAL studies showed noncompressible on the right but triphasic and biphasic waveforms at the PTA and DP respectively. TBI slightly reduced at 0.62. On the left again triphasic and biphasic waveforms at the PTA and DP respectively. ABI  noncompressible. TBI slightly reduced at 0.61. She likely has some arterial disease. She does describe some pain in her legs with activity which forces her to stop. This is with quite a bit of walking. Her wound is smaller by about half a centimeter in diameter. We are using [facility] health is changing 08/30/2018; arrives today with a wound not in a viable surface requiring debridement. Still using silver collagen. She does not have a major arterial issue 1/13; the patient's wound is not come down in size. Covered in eschar we have been using silver collagen. Intake nurse noted purulent drainage 1/20; the patient's wounds is half the size. We use silver alginate starting last time. Surface of the wound appears healthy 1/27; patient's wound only has a minuscule open area. We have been using silver alginate under Kerlix Coban 2/3; patient's left leg is closed. These were initial traumatic wounds. She has some degree of chronic venous insufficiency with hemosiderin deposition. She did not have significant arterial studies by results we did in this clinic 05/01/2023 Ms. Kyley Kovacevich is a 89 year old female with a past medical history of COPD and venous insufficiency that presents to the clinic for a 1 month history of nonhealing wound to the right lower extremity. She reports hitting her shin against a stepstool creating a wound. She has been keeping the area covered. She has been following with her primary care physician for this issue and has been treated with Keflex and levofloxacin for this issue. She currently denies signs of infection. She does not wear compression stockings. 9/13; patient presents for follow-up. We have been using antibiotic ointment with PolyMem under Kerlix/Coban to the right lower extremity. Wounds are smaller. Electronic Signature(s) Signed: 05/08/2023 12:33:51 PM By: Geralyn Corwin DO Entered By: Geralyn Corwin on 05/08/2023 10:22:52 [name] ([id number])  [id number] Page 3 of 10 -------------------------------------------------------------------------------- Physical Exam Details Patient Name: Date of Service: [name] 05/08/2023 9:30 A M Medical Record Number: [id number] Patient Account Number: [id number] Date of Birth/Sex: Treating RN: [birth year] (89 y.o. F) Primary Care Provider: Seabron Spates Other Clinician: Referring Provider: Treating Provider/Extender: Mikey Bussing,  [name], [name] ([id number]) [id number] Page 1 of 10 Visit Report for 05/08/2023 Chief Complaint Document Details Patient Name: Date of Service: Lindsey [name] 05/08/2023 9:30 A M Medical Record Number: [id number] Patient Account Number: [id number] Date of Birth/Sex: Treating RN: 1928-04-25 (89 y.o. F) Primary Care Provider: Seabron Spates Other Clinician: Referring Provider: Treating Provider/Extender: Ranee Gosselin in Treatment: 1 Information Obtained from: Patient Chief Complaint 07/26/2018; patient is here for review of laceration injuries on the left anterior tibia area 05/01/2023; patient presents for right lower extremity wounds in the setting of trauma Electronic Signature(s) Signed: 05/08/2023 12:33:51 PM By: Geralyn Corwin DO Entered By: Geralyn Corwin on 05/08/2023 10:22:14 -------------------------------------------------------------------------------- Debridement Details Patient Name: Date of Service: Lindsey [name] 05/08/2023 9:30 A M Medical Record Number: [id number] Patient Account Number: [id number] Date of Birth/Sex: Treating RN: [birth year] (89 y.o. Katrinka Blazing Primary Care Provider: Seabron Spates Other Clinician: Referring Provider: Treating Provider/Extender: Ranee Gosselin in Treatment: 1 Debridement Performed for Assessment: Wound #4 Right,Distal,Anterior Lower Leg Performed By: Physician Geralyn Corwin, DO The following information was scribed by: Karie Schwalbe The information was scribed for: Geralyn Corwin Debridement Type: Debridement Level of Consciousness (Pre-procedure): Awake and Alert Pre-procedure Verification/Time Out No Taken: Start Time: 10:14 Pain Control: Lidocaine 4% T opical Solution Percent of Wound Bed Debrided: 100% T Area Debrided (cm): otal 1.43 Tissue and other material debrided: Viable, Non-Viable, Slough, Skin:  Dermis , Skin: Epidermis, Slough Level: Skin/Epidermis Debridement Description: Selective/Open Wound Instrument: Curette Bleeding: Minimum Hemostasis Achieved: Pressure End Time: 10:16 Procedural Pain: 0 Post Procedural Pain: 0 Response to Treatment: Procedure was tolerated [name] Level of Consciousness (Post- Awake and Alert procedure): Post Debridement Measurements of Total Wound Length: (cm) 1.4 Width: (cm) 1.3 Depth: (cm) 0.1 [name], [name] ([id number]) [id number] Page 2 of 10 Volume: (cm) 0.143 Character of Wound/Ulcer Post Debridement: Improved Post Procedure Diagnosis Same as Pre-procedure Electronic Signature(s) Signed: 05/08/2023 11:56:55 AM By: Karie Schwalbe RN Signed: 05/08/2023 12:33:51 PM By: Geralyn Corwin DO Entered By: Karie Schwalbe on 05/08/2023 10:17:08 -------------------------------------------------------------------------------- HPI Details Patient Name: Date of Service: Lindsey [name] 05/08/2023 9:30 A M Medical Record Number: [id number] Patient Account Number: [id number] Date of Birth/Sex: Treating RN: 1928/07/25 (89 y.o. F) Primary Care Provider: Seabron Spates Other Clinician: Referring Provider: Treating Provider/Extender: Ranee Gosselin in Treatment: 1 History of Present Illness HPI Description: ADMISSION 07/26/2018 This is an 89 year old woman who lives independently in her own apartment. 3 weeks ago she suffered a fall traumatizing her anterior lower legs against a small table. She developed laceration injuries on the left and right tibial areas. The area on the right has since healed. She has been seeing her primary physician at [facility]. She has had a round of Keflex and more recently a round of Levaquin for periwound cellulitis. According to her daughter she seemed to respond better to the Levaquin. I am not aware of any cultures. They have been using antibiotic cream, T and  gauze to dress the wounds. As mentioned an area on elfa the right anterior tibia just below the tibial tuberosity has healed. Past medical history; the patient is not a diabetic nor does she have any history of PAD. She has a history of anemia, depression, gastroesophageal reflux disease, bilateral hearing loss, hyperlipidemia, hypothyroidism, coronary artery artery disease osteoporosis and hypothyroidism she has a history of renal cell carcinoma the exact status of this is uncertain In our clinic her  tissue or cotton balls. Peri-Wound Care: Sween Lotion (Moisturizing lotion) 1 x Per Week/30 Days Discharge Instructions: Apply moisturizing lotion as directed Topical: Gentamicin 1 x Per Week/30 Days Discharge Instructions: As directed by physician Topical: Mupirocin Ointment 1 x Per Week/30 Days Discharge Instructions: Apply Mupirocin (Bactroban) as instructed Prim Dressing: PolyMem Silver Non-Adhesive Dressing, 4.25x4.25 in 1 x Per Week/30 Days ary Discharge Instructions: Apply to wound bed as  instructed Secondary Dressing: ABD Pad, 8x10 1 x Per Week/30 Days Discharge Instructions: Apply over primary dressing as directed. Secondary Dressing: Woven Gauze Sponge, Non-Sterile 4x4 in 1 x Per Week/30 Days Discharge Instructions: Apply over primary dressing as directed. Secured With: Transpore Surgical T ape, 2x10 (in/yd) 1 x Per Week/30 Days Discharge Instructions: Secure dressing with tape as directed. Com pression Wrap: Kerlix Roll 4.5x3.1 (in/yd) 1 x Per Week/30 Days Discharge Instructions: Apply Kerlix and Coban compression as directed. Com pression Wrap: Coban Self-Adherent Wrap 4x5 (in/yd) 1 x Per Week/30 Days Discharge Instructions: Apply over Kerlix as directed. Com pression Wrap: Tubular netting #5 1 x Per Week/30 Days 1. PolyMem silver with antibiotic ointment under Kerlix/Coban to the right lower extremity 2. Follow-up in 1 week 3. In office sharp debridement Electronic Signature(s) Signed: 05/08/2023 12:33:51 PM By: Geralyn Corwin DO Entered By: Geralyn Corwin on 05/08/2023 10:25:55 -------------------------------------------------------------------------------- HxROS Details Patient Name: Date of Service: Lindsey [name]. 05/08/2023 9:30 A M Medical Record Number: [id number] Patient Account Number: [id number] Date of Birth/Sex: Treating RN: December 03, 1927 (89 y.o. F) Primary Care Provider: Seabron Spates Other Clinician: Referring Provider: Treating Provider/Extender: Ranee Gosselin in Treatment: 1 Information Obtained From Patient Chart Constitutional Symptoms (General Health) Medical History: Past Medical History NotesTRESSIE, [name] ([id number]) [id number] Page 9 of 10 almost bedridden per dgtr - difficult to stand up and sit down safely Eyes Medical History: Positive for: Cataracts - removed X2 Ear/Nose/Mouth/Throat Medical History: Past Medical History Notes: totally deaf on left ; partial  hearing with hearing aide on the left Hematologic/Lymphatic Medical History: Positive for: Anemia - s/p heart surgery Cardiovascular Medical History: Positive for: Hypotension; Myocardial Infarction Past Medical History Notes: atherosclerosis Endocrine Medical History: Past Medical History Notes: hypothyroid Integumentary (Skin) Medical History: Negative for: History of Burn Past Medical History Notes: wound of right Lower leg Musculoskeletal Medical History: Negative for: Gout; Rheumatoid Arthritis; Osteoarthritis; Osteomyelitis Oncologic Medical History: Negative for: Received Chemotherapy; Received Radiation Past Medical History Notes: 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 (no chemo or radiation for this) Psychiatric Medical History: Past Medical History Notes: depression HBO Extended History Items Eyes: Cataracts Immunizations Pneumococcal Vaccine: Received Pneumococcal Vaccination: Yes Received Pneumococcal Vaccination On or After 60th Birthday: Yes Tetanus Vaccine: Last tetanus shot: 06/08/2016 Implantable Devices None Hospitalization / Surgery History Type of Hospitalization/Surgery renal mass ablation syncope Family and Social History Cancer: Yes - Siblings; Diabetes: No; Heart Disease: Yes - Mother; Hereditary Spherocytosis: No; Hypertension: Yes - Mother; Kidney DiseaseMEDIA, [name] ([id number]) [id number] Page 10 of 10 Siblings; Lung Disease: No; Seizures: No; Stroke: No; Thyroid Problems: No; Tuberculosis: No; Former smoker - quit 60 years ago; Marital Status - Widowed; Alcohol Use: Never; Drug Use: No History; Caffeine Use: Daily - coffee; Financial Concerns: No; Food, Clothing or Shelter Needs: No; Support System Lacking: No; Transportation Concerns: No Electronic Signature(s) Signed: 05/08/2023 12:33:51 PM By: Geralyn Corwin DO Entered By: Geralyn Corwin on 05/08/2023  10:23:24 -------------------------------------------------------------------------------- SuperBill Details Patient Name: Date of Service: [name],  Karie Chimera, Myrene Buddy Weeks in Treatment: 1 Constitutional respirations regular, non-labored and within target range for patient.. Cardiovascular 2+ dorsalis pedis/posterior tibialis pulses. Psychiatric pleasant and cooperative. Notes Right lower extremity: T the anterior aspect there are 2 open wounds. The anterior proximal wound has granulation tissue. The more distal wound has non o viable tissue and granulation tissue. No signs of surrounding infection including increased warmth, erythema or purulent drainage. Venous stasis dermatitis throughout the leg. Good edema control. Electronic Signature(s) Signed: 05/08/2023 12:33:51 PM By: Geralyn Corwin DO Entered By: Geralyn Corwin on 05/08/2023 10:24:23 -------------------------------------------------------------------------------- Physician Orders Details Patient Name: Date of Service: Lindsey [name] 05/08/2023 9:30 A M Medical Record Number: [id number] Patient Account Number: [id number] Date of Birth/Sex: Treating RN: November 25, 1927 (89 y.o. Katrinka Blazing Primary Care Provider: Seabron Spates Other Clinician: Referring Provider: Treating Provider/Extender: Ranee Gosselin in Treatment: 1 Verbal / Phone Orders: No Diagnosis Coding Follow-up Appointments ppointment in 1 week. - Dr. Lady Gary Room 2 05/15/23 at 8:15am Return A Anesthetic (In clinic) Topical Lidocaine 4% applied to wound bed Bathing/ Shower/ Hygiene May shower  with protection but do not get wound dressing(s) wet. Protect dressing(s) with water repellant cover (for example, large plastic bag) or a cast cover and may then take shower. - Please cover right leg if showering. Please keep the right leg wraps dry Edema Control - Lymphedema / SCD / Other Right Lower Extremity Elevate legs to the level of the heart or above for 30 minutes daily and/or when sitting for 3-4 times a day throughout the day. Avoid standing for long periods of time. Exercise regularly - As tolerated Wound Treatment Wound #3 - Lower Leg Wound Laterality: Right, Anterior, Proximal Cleanser: Soap and Water 1 x Per Week/30 Days Discharge Instructions: May shower and wash wound with dial antibacterial soap and water prior to dressing change. Cleanser: Vashe 5.8 (oz) 1 x Per Week/30 Days Discharge Instructions: Cleanse the wound with Vashe prior to applying a clean dressing using gauze sponges, not tissue or cotton balls. [name], [name] ([id number]) [id number] Page 4 of 10 Peri-Wound Care: Sween Lotion (Moisturizing lotion) 1 x Per Week/30 Days Discharge Instructions: Apply moisturizing lotion as directed Topical: Gentamicin 1 x Per Week/30 Days Discharge Instructions: As directed by physician Topical: Mupirocin Ointment 1 x Per Week/30 Days Discharge Instructions: Apply Mupirocin (Bactroban) as instructed Prim Dressing: PolyMem Silver Non-Adhesive Dressing, 4.25x4.25 in 1 x Per Week/30 Days ary Discharge Instructions: Apply to wound bed as instructed Secondary Dressing: ABD Pad, 8x10 1 x Per Week/30 Days Discharge Instructions: Apply over primary dressing as directed. Secondary Dressing: Woven Gauze Sponge, Non-Sterile 4x4 in 1 x Per Week/30 Days Discharge Instructions: Apply over primary dressing as directed. Secured With: Transpore Surgical Tape, 2x10 (in/yd) 1 x Per Week/30 Days Discharge Instructions: Secure dressing with tape as  directed. Compression Wrap: Kerlix Roll 4.5x3.1 (in/yd) 1 x Per Week/30 Days Discharge Instructions: Apply Kerlix and Coban compression as directed. Compression Wrap: Coban Self-Adherent Wrap 4x5 (in/yd) 1 x Per Week/30 Days Discharge Instructions: Apply over Kerlix as directed. Compression Wrap: Tubular netting #5 1 x Per Week/30 Days Wound #4 - Lower Leg Wound Laterality: Right, Anterior, Distal Cleanser: Soap and Water 1 x Per Week/30 Days Discharge Instructions: May shower and wash wound with dial antibacterial soap and water prior to dressing change. Cleanser: Vashe 5.8 (oz) 1 x Per Week/30 Days Discharge Instructions: Cleanse the wound with Vashe prior to applying a clean dressing using gauze sponges, not tissue  [name], [name] ([id number]) [id number] Page 1 of 10 Visit Report for 05/08/2023 Chief Complaint Document Details Patient Name: Date of Service: Lindsey [name] 05/08/2023 9:30 A M Medical Record Number: [id number] Patient Account Number: [id number] Date of Birth/Sex: Treating RN: 1928-04-25 (89 y.o. F) Primary Care Provider: Seabron Spates Other Clinician: Referring Provider: Treating Provider/Extender: Ranee Gosselin in Treatment: 1 Information Obtained from: Patient Chief Complaint 07/26/2018; patient is here for review of laceration injuries on the left anterior tibia area 05/01/2023; patient presents for right lower extremity wounds in the setting of trauma Electronic Signature(s) Signed: 05/08/2023 12:33:51 PM By: Geralyn Corwin DO Entered By: Geralyn Corwin on 05/08/2023 10:22:14 -------------------------------------------------------------------------------- Debridement Details Patient Name: Date of Service: Lindsey [name] 05/08/2023 9:30 A M Medical Record Number: [id number] Patient Account Number: [id number] Date of Birth/Sex: Treating RN: [birth year] (89 y.o. Katrinka Blazing Primary Care Provider: Seabron Spates Other Clinician: Referring Provider: Treating Provider/Extender: Ranee Gosselin in Treatment: 1 Debridement Performed for Assessment: Wound #4 Right,Distal,Anterior Lower Leg Performed By: Physician Geralyn Corwin, DO The following information was scribed by: Karie Schwalbe The information was scribed for: Geralyn Corwin Debridement Type: Debridement Level of Consciousness (Pre-procedure): Awake and Alert Pre-procedure Verification/Time Out No Taken: Start Time: 10:14 Pain Control: Lidocaine 4% T opical Solution Percent of Wound Bed Debrided: 100% T Area Debrided (cm): otal 1.43 Tissue and other material debrided: Viable, Non-Viable, Slough, Skin:  Dermis , Skin: Epidermis, Slough Level: Skin/Epidermis Debridement Description: Selective/Open Wound Instrument: Curette Bleeding: Minimum Hemostasis Achieved: Pressure End Time: 10:16 Procedural Pain: 0 Post Procedural Pain: 0 Response to Treatment: Procedure was tolerated [name] Level of Consciousness (Post- Awake and Alert procedure): Post Debridement Measurements of Total Wound Length: (cm) 1.4 Width: (cm) 1.3 Depth: (cm) 0.1 [name], [name] ([id number]) [id number] Page 2 of 10 Volume: (cm) 0.143 Character of Wound/Ulcer Post Debridement: Improved Post Procedure Diagnosis Same as Pre-procedure Electronic Signature(s) Signed: 05/08/2023 11:56:55 AM By: Karie Schwalbe RN Signed: 05/08/2023 12:33:51 PM By: Geralyn Corwin DO Entered By: Karie Schwalbe on 05/08/2023 10:17:08 -------------------------------------------------------------------------------- HPI Details Patient Name: Date of Service: Lindsey [name] 05/08/2023 9:30 A M Medical Record Number: [id number] Patient Account Number: [id number] Date of Birth/Sex: Treating RN: 1928/07/25 (89 y.o. F) Primary Care Provider: Seabron Spates Other Clinician: Referring Provider: Treating Provider/Extender: Ranee Gosselin in Treatment: 1 History of Present Illness HPI Description: ADMISSION 07/26/2018 This is an 89 year old woman who lives independently in her own apartment. 3 weeks ago she suffered a fall traumatizing her anterior lower legs against a small table. She developed laceration injuries on the left and right tibial areas. The area on the right has since healed. She has been seeing her primary physician at [facility]. She has had a round of Keflex and more recently a round of Levaquin for periwound cellulitis. According to her daughter she seemed to respond better to the Levaquin. I am not aware of any cultures. They have been using antibiotic cream, T and  gauze to dress the wounds. As mentioned an area on elfa the right anterior tibia just below the tibial tuberosity has healed. Past medical history; the patient is not a diabetic nor does she have any history of PAD. She has a history of anemia, depression, gastroesophageal reflux disease, bilateral hearing loss, hyperlipidemia, hypothyroidism, coronary artery artery disease osteoporosis and hypothyroidism she has a history of renal cell carcinoma the exact status of this is uncertain In our clinic her  Karie Chimera, Myrene Buddy Weeks in Treatment: 1 Constitutional respirations regular, non-labored and within target range for patient.. Cardiovascular 2+ dorsalis pedis/posterior tibialis pulses. Psychiatric pleasant and cooperative. Notes Right lower extremity: T the anterior aspect there are 2 open wounds. The anterior proximal wound has granulation tissue. The more distal wound has non o viable tissue and granulation tissue. No signs of surrounding infection including increased warmth, erythema or purulent drainage. Venous stasis dermatitis throughout the leg. Good edema control. Electronic Signature(s) Signed: 05/08/2023 12:33:51 PM By: Geralyn Corwin DO Entered By: Geralyn Corwin on 05/08/2023 10:24:23 -------------------------------------------------------------------------------- Physician Orders Details Patient Name: Date of Service: Lindsey [name] 05/08/2023 9:30 A M Medical Record Number: [id number] Patient Account Number: [id number] Date of Birth/Sex: Treating RN: November 25, 1927 (89 y.o. Katrinka Blazing Primary Care Provider: Seabron Spates Other Clinician: Referring Provider: Treating Provider/Extender: Ranee Gosselin in Treatment: 1 Verbal / Phone Orders: No Diagnosis Coding Follow-up Appointments ppointment in 1 week. - Dr. Lady Gary Room 2 05/15/23 at 8:15am Return A Anesthetic (In clinic) Topical Lidocaine 4% applied to wound bed Bathing/ Shower/ Hygiene May shower  with protection but do not get wound dressing(s) wet. Protect dressing(s) with water repellant cover (for example, large plastic bag) or a cast cover and may then take shower. - Please cover right leg if showering. Please keep the right leg wraps dry Edema Control - Lymphedema / SCD / Other Right Lower Extremity Elevate legs to the level of the heart or above for 30 minutes daily and/or when sitting for 3-4 times a day throughout the day. Avoid standing for long periods of time. Exercise regularly - As tolerated Wound Treatment Wound #3 - Lower Leg Wound Laterality: Right, Anterior, Proximal Cleanser: Soap and Water 1 x Per Week/30 Days Discharge Instructions: May shower and wash wound with dial antibacterial soap and water prior to dressing change. Cleanser: Vashe 5.8 (oz) 1 x Per Week/30 Days Discharge Instructions: Cleanse the wound with Vashe prior to applying a clean dressing using gauze sponges, not tissue or cotton balls. [name], [name] ([id number]) [id number] Page 4 of 10 Peri-Wound Care: Sween Lotion (Moisturizing lotion) 1 x Per Week/30 Days Discharge Instructions: Apply moisturizing lotion as directed Topical: Gentamicin 1 x Per Week/30 Days Discharge Instructions: As directed by physician Topical: Mupirocin Ointment 1 x Per Week/30 Days Discharge Instructions: Apply Mupirocin (Bactroban) as instructed Prim Dressing: PolyMem Silver Non-Adhesive Dressing, 4.25x4.25 in 1 x Per Week/30 Days ary Discharge Instructions: Apply to wound bed as instructed Secondary Dressing: ABD Pad, 8x10 1 x Per Week/30 Days Discharge Instructions: Apply over primary dressing as directed. Secondary Dressing: Woven Gauze Sponge, Non-Sterile 4x4 in 1 x Per Week/30 Days Discharge Instructions: Apply over primary dressing as directed. Secured With: Transpore Surgical Tape, 2x10 (in/yd) 1 x Per Week/30 Days Discharge Instructions: Secure dressing with tape as  directed. Compression Wrap: Kerlix Roll 4.5x3.1 (in/yd) 1 x Per Week/30 Days Discharge Instructions: Apply Kerlix and Coban compression as directed. Compression Wrap: Coban Self-Adherent Wrap 4x5 (in/yd) 1 x Per Week/30 Days Discharge Instructions: Apply over Kerlix as directed. Compression Wrap: Tubular netting #5 1 x Per Week/30 Days Wound #4 - Lower Leg Wound Laterality: Right, Anterior, Distal Cleanser: Soap and Water 1 x Per Week/30 Days Discharge Instructions: May shower and wash wound with dial antibacterial soap and water prior to dressing change. Cleanser: Vashe 5.8 (oz) 1 x Per Week/30 Days Discharge Instructions: Cleanse the wound with Vashe prior to applying a clean dressing using gauze sponges, not tissue  [name], [name] ([id number]) [id number] Page 1 of 10 Visit Report for 05/08/2023 Chief Complaint Document Details Patient Name: Date of Service: Lindsey [name] 05/08/2023 9:30 A M Medical Record Number: [id number] Patient Account Number: [id number] Date of Birth/Sex: Treating RN: 1928-04-25 (89 y.o. F) Primary Care Provider: Seabron Spates Other Clinician: Referring Provider: Treating Provider/Extender: Ranee Gosselin in Treatment: 1 Information Obtained from: Patient Chief Complaint 07/26/2018; patient is here for review of laceration injuries on the left anterior tibia area 05/01/2023; patient presents for right lower extremity wounds in the setting of trauma Electronic Signature(s) Signed: 05/08/2023 12:33:51 PM By: Geralyn Corwin DO Entered By: Geralyn Corwin on 05/08/2023 10:22:14 -------------------------------------------------------------------------------- Debridement Details Patient Name: Date of Service: Lindsey [name] 05/08/2023 9:30 A M Medical Record Number: [id number] Patient Account Number: [id number] Date of Birth/Sex: Treating RN: [birth year] (89 y.o. Katrinka Blazing Primary Care Provider: Seabron Spates Other Clinician: Referring Provider: Treating Provider/Extender: Ranee Gosselin in Treatment: 1 Debridement Performed for Assessment: Wound #4 Right,Distal,Anterior Lower Leg Performed By: Physician Geralyn Corwin, DO The following information was scribed by: Karie Schwalbe The information was scribed for: Geralyn Corwin Debridement Type: Debridement Level of Consciousness (Pre-procedure): Awake and Alert Pre-procedure Verification/Time Out No Taken: Start Time: 10:14 Pain Control: Lidocaine 4% T opical Solution Percent of Wound Bed Debrided: 100% T Area Debrided (cm): otal 1.43 Tissue and other material debrided: Viable, Non-Viable, Slough, Skin:  Dermis , Skin: Epidermis, Slough Level: Skin/Epidermis Debridement Description: Selective/Open Wound Instrument: Curette Bleeding: Minimum Hemostasis Achieved: Pressure End Time: 10:16 Procedural Pain: 0 Post Procedural Pain: 0 Response to Treatment: Procedure was tolerated [name] Level of Consciousness (Post- Awake and Alert procedure): Post Debridement Measurements of Total Wound Length: (cm) 1.4 Width: (cm) 1.3 Depth: (cm) 0.1 [name], [name] ([id number]) [id number] Page 2 of 10 Volume: (cm) 0.143 Character of Wound/Ulcer Post Debridement: Improved Post Procedure Diagnosis Same as Pre-procedure Electronic Signature(s) Signed: 05/08/2023 11:56:55 AM By: Karie Schwalbe RN Signed: 05/08/2023 12:33:51 PM By: Geralyn Corwin DO Entered By: Karie Schwalbe on 05/08/2023 10:17:08 -------------------------------------------------------------------------------- HPI Details Patient Name: Date of Service: Lindsey [name] 05/08/2023 9:30 A M Medical Record Number: [id number] Patient Account Number: [id number] Date of Birth/Sex: Treating RN: 1928/07/25 (89 y.o. F) Primary Care Provider: Seabron Spates Other Clinician: Referring Provider: Treating Provider/Extender: Ranee Gosselin in Treatment: 1 History of Present Illness HPI Description: ADMISSION 07/26/2018 This is an 89 year old woman who lives independently in her own apartment. 3 weeks ago she suffered a fall traumatizing her anterior lower legs against a small table. She developed laceration injuries on the left and right tibial areas. The area on the right has since healed. She has been seeing her primary physician at [facility]. She has had a round of Keflex and more recently a round of Levaquin for periwound cellulitis. According to her daughter she seemed to respond better to the Levaquin. I am not aware of any cultures. They have been using antibiotic cream, T and  gauze to dress the wounds. As mentioned an area on elfa the right anterior tibia just below the tibial tuberosity has healed. Past medical history; the patient is not a diabetic nor does she have any history of PAD. She has a history of anemia, depression, gastroesophageal reflux disease, bilateral hearing loss, hyperlipidemia, hypothyroidism, coronary artery artery disease osteoporosis and hypothyroidism she has a history of renal cell carcinoma the exact status of this is uncertain In our clinic her  Karie Chimera, Myrene Buddy Weeks in Treatment: 1 Constitutional respirations regular, non-labored and within target range for patient.. Cardiovascular 2+ dorsalis pedis/posterior tibialis pulses. Psychiatric pleasant and cooperative. Notes Right lower extremity: T the anterior aspect there are 2 open wounds. The anterior proximal wound has granulation tissue. The more distal wound has non o viable tissue and granulation tissue. No signs of surrounding infection including increased warmth, erythema or purulent drainage. Venous stasis dermatitis throughout the leg. Good edema control. Electronic Signature(s) Signed: 05/08/2023 12:33:51 PM By: Geralyn Corwin DO Entered By: Geralyn Corwin on 05/08/2023 10:24:23 -------------------------------------------------------------------------------- Physician Orders Details Patient Name: Date of Service: Lindsey [name] 05/08/2023 9:30 A M Medical Record Number: [id number] Patient Account Number: [id number] Date of Birth/Sex: Treating RN: November 25, 1927 (89 y.o. Katrinka Blazing Primary Care Provider: Seabron Spates Other Clinician: Referring Provider: Treating Provider/Extender: Ranee Gosselin in Treatment: 1 Verbal / Phone Orders: No Diagnosis Coding Follow-up Appointments ppointment in 1 week. - Dr. Lady Gary Room 2 05/15/23 at 8:15am Return A Anesthetic (In clinic) Topical Lidocaine 4% applied to wound bed Bathing/ Shower/ Hygiene May shower  with protection but do not get wound dressing(s) wet. Protect dressing(s) with water repellant cover (for example, large plastic bag) or a cast cover and may then take shower. - Please cover right leg if showering. Please keep the right leg wraps dry Edema Control - Lymphedema / SCD / Other Right Lower Extremity Elevate legs to the level of the heart or above for 30 minutes daily and/or when sitting for 3-4 times a day throughout the day. Avoid standing for long periods of time. Exercise regularly - As tolerated Wound Treatment Wound #3 - Lower Leg Wound Laterality: Right, Anterior, Proximal Cleanser: Soap and Water 1 x Per Week/30 Days Discharge Instructions: May shower and wash wound with dial antibacterial soap and water prior to dressing change. Cleanser: Vashe 5.8 (oz) 1 x Per Week/30 Days Discharge Instructions: Cleanse the wound with Vashe prior to applying a clean dressing using gauze sponges, not tissue or cotton balls. [name], [name] ([id number]) [id number] Page 4 of 10 Peri-Wound Care: Sween Lotion (Moisturizing lotion) 1 x Per Week/30 Days Discharge Instructions: Apply moisturizing lotion as directed Topical: Gentamicin 1 x Per Week/30 Days Discharge Instructions: As directed by physician Topical: Mupirocin Ointment 1 x Per Week/30 Days Discharge Instructions: Apply Mupirocin (Bactroban) as instructed Prim Dressing: PolyMem Silver Non-Adhesive Dressing, 4.25x4.25 in 1 x Per Week/30 Days ary Discharge Instructions: Apply to wound bed as instructed Secondary Dressing: ABD Pad, 8x10 1 x Per Week/30 Days Discharge Instructions: Apply over primary dressing as directed. Secondary Dressing: Woven Gauze Sponge, Non-Sterile 4x4 in 1 x Per Week/30 Days Discharge Instructions: Apply over primary dressing as directed. Secured With: Transpore Surgical Tape, 2x10 (in/yd) 1 x Per Week/30 Days Discharge Instructions: Secure dressing with tape as  directed. Compression Wrap: Kerlix Roll 4.5x3.1 (in/yd) 1 x Per Week/30 Days Discharge Instructions: Apply Kerlix and Coban compression as directed. Compression Wrap: Coban Self-Adherent Wrap 4x5 (in/yd) 1 x Per Week/30 Days Discharge Instructions: Apply over Kerlix as directed. Compression Wrap: Tubular netting #5 1 x Per Week/30 Days Wound #4 - Lower Leg Wound Laterality: Right, Anterior, Distal Cleanser: Soap and Water 1 x Per Week/30 Days Discharge Instructions: May shower and wash wound with dial antibacterial soap and water prior to dressing change. Cleanser: Vashe 5.8 (oz) 1 x Per Week/30 Days Discharge Instructions: Cleanse the wound with Vashe prior to applying a clean dressing using gauze sponges, not tissue

## 2023-05-08 NOTE — Progress Notes (Signed)
[name] in Treatment: 1 Wound Status Wound Number: 4 Primary Etiology: Trauma, Other Wound Location: Right, Distal, Anterior Lower Leg Wound Status: Open Wounding Event: Hematoma Comorbid History: Cataracts, Anemia, Hypotension, Myocardial Infarction Date Acquired: 04/05/2023 Weeks Of Treatment: 1 Clustered Wound: No Photos Wound Measurements [name] ([id number]) Length: (cm) 1.4 Width: (cm) 1.3 Depth: (cm) 0.1 Area: (cm) 1.429 Volume: (cm) 0.143 [id number] Page 8 of 9 % Reduction in Area: 58.7% % Reduction in Volume: 58.7% Epithelialization: Small (1-33%) Tunneling: No Undermining: No Wound Description Classification: Full Thickness Without Exposed Support Structures Wound Margin: Fibrotic scar, thickened scar Exudate Amount: Medium Exudate Type: Serosanguineous Exudate Color: red, brown Foul Odor After Cleansing: No Slough/Fibrino Yes Wound Bed Granulation Amount: Medium (34-66%) Exposed Structure Granulation Quality: Red Fascia Exposed: No Necrotic Amount: Small (1-33%) Fat Layer (Subcutaneous Tissue) Exposed: Yes Necrotic Quality: Adherent Slough Tendon Exposed: No Muscle Exposed: No Joint Exposed: No Bone Exposed: No Periwound Skin Texture Texture Color No Abnormalities Noted: No No Abnormalities Noted: No Callus: No Atrophie Blanche: No Crepitus: No Cyanosis: No Excoriation: No Ecchymosis: No Induration: No Erythema: No Rash: No Hemosiderin Staining: Yes Scarring:  No Mottled: No Pallor: No Moisture Rubor: No No Abnormalities Noted: Yes Temperature / Pain Temperature: No Abnormality Treatment Notes Wound #4 (Lower Leg) Wound Laterality: Right, Anterior, Distal Cleanser Soap and Water Discharge Instruction: May shower and wash wound with dial antibacterial soap and water prior to dressing change. Vashe 5.8 (oz) Discharge Instruction: Cleanse the wound with Vashe prior to applying a clean dressing using gauze sponges, not tissue or cotton balls. Peri-Wound Care Sween Lotion (Moisturizing lotion) Discharge Instruction: Apply moisturizing lotion as directed Topical Gentamicin Discharge Instruction: As directed by physician Mupirocin Ointment Discharge Instruction: Apply Mupirocin (Bactroban) as instructed Primary Dressing PolyMem Silver Non-Adhesive Dressing, 4.25x4.25 in Discharge Instruction: Apply to wound bed as instructed Secondary Dressing ABD Pad, 8x10 Discharge Instruction: Apply over primary dressing as directed. Woven Gauze Sponge, Non-Sterile 4x4 in Discharge Instruction: Apply over primary dressing as directed. Secured With Transpore Surgical Tape, 2x10 (in/yd) Discharge Instruction: Secure dressing with tape as directed. Compression Wrap Kerlix Roll 4.5x3.1 (in/yd) Discharge Instruction: Apply Kerlix and Coban compression as directed. [name] ([id number]) [id number] Page 9 of 9 Coban Self-Adherent Wrap 4x5 (in/yd) Discharge Instruction: Apply over Kerlix as directed. Tubular netting #5 Compression Stockings Add-Ons Electronic Signature(s) Signed: 05/08/2023 11:56:55 AM By: Karie Schwalbe RN Entered By: Karie Schwalbe on 05/08/2023 10:15:35 -------------------------------------------------------------------------------- Vitals Details Patient Name: Date of Service: [name] 05/08/2023 9:30 A M Medical Record Number: [id number] Patient Account Number: [id number] Date of  Birth/Sex: Treating RN: 07/18/1928 (89 y.o. F) Primary Care Zacary Bauer: Seabron Spates Other Clinician: Referring Reda Citron: Treating Lasonia Casino/Extender: Ranee Gosselin in Treatment: 1 Vital Signs Time Taken: 09:48 Temperature (F): 98.4 Height (in): 62 Pulse (bpm): 79 Weight (lbs): 85 Respiratory Rate (breaths/min): 16 Body Mass Index (BMI): 15.5 Blood Pressure (mmHg): 126/62 Reference Range: 80 - 120 mg / dl Electronic Signature(s) Signed: 05/08/2023 11:49:10 AM By: Thayer Dallas Entered By: Thayer Dallas on 05/08/2023 09:49:08  Anemia, Hypotension, N/A Comorbid History: Myocardial Infarction Myocardial Infarction 04/05/2023 04/05/2023 N/A Date A cquired: 1 1 N/A Weeks of Treatment: Open Open N/A Wound Status: No No N/A Wound Recurrence: 0.3x0.8x0.1 1.4x1.3x0.1 N/A Measurements L x W x D (cm) 0.188 1.429 N/A A (cm) : rea 0.019 0.143 N/A Volume (cm) : 60.10% 58.70% N/A % Reduction in A rea: 59.60% 58.70% N/A % Reduction in Volume: Full Thickness Without Exposed Full Thickness Without Exposed N/A Classification: Support Structures Support Structures Medium Medium N/A Exudate A mount: Serosanguineous Serosanguineous N/A Exudate Type: red, brown red, brown N/A Exudate Color: Distinct, outline attached Fibrotic scar, thickened scar N/A Wound Margin: None Present (0%) Medium (34-66%) N/A Granulation A mount: N/A Red N/A Granulation Quality: Large (67-100%) Small (1-33%) N/A Necrotic A mount: Eschar, Adherent Slough Adherent Slough N/A Necrotic Tissue: Fat Layer (Subcutaneous Tissue): Yes Fat Layer (Subcutaneous Tissue): Yes N/A Exposed Structures: Fascia: No Fascia: No Tendon: No Tendon:  No Muscle: No Muscle: No Joint: No Joint: No Bone: No Bone: No None Small (1-33%) N/A Epithelialization: N/A Debridement - Selective/Open Wound N/A Debridement: N/A Lidocaine 4% T opical Solution N/A Pain Control: N/A Slough N/A Tissue Debrided: N/A Skin/Epidermis N/A Level: N/A 1.43 N/A Debridement A (sq cm): rea N/A Curette N/A Instrument: N/A Minimum N/A Bleeding: N/A Pressure N/A Hemostasis A chieved: N/A 0 N/A Procedural Pain: N/A 0 N/A Post Procedural Pain: Debridement Treatment Response: N/A Procedure was tolerated well N/A Post Debridement Measurements L x N/A 1.4x1.3x0.1 N/A W x D (cm) N/A 0.143 N/A Post Debridement Volume: (cm) Excoriation: No Excoriation: No N/A Periwound Skin Texture: Induration: No Induration: No Callus: No Callus: No Crepitus: No Crepitus: No Rash: No Rash: No Scarring: No Scarring: No Maceration: No Maceration: No N/A Periwound Skin Moisture: Dry/Scaly: No Dry/Scaly: No Atrophie Blanche: No Hemosiderin Staining: Yes N/A Periwound Skin Color: Cyanosis: No Atrophie Blanche: No Ecchymosis: No Cyanosis: No Erythema: No Ecchymosis: No Hemosiderin Staining: No Erythema: No Mottled: No Mottled: No Pallor: No Pallor: No Rubor: No Rubor: No No Abnormality No Abnormality N/A Temperature: N/A Debridement N/A Procedures Performed: Treatment Notes Electronic Signature(s) Signed: 05/08/2023 12:33:51 PM By: Geralyn Corwin DO Entered By: Geralyn Corwin on 05/08/2023 10:18:59 [name] ([id number]) [id number] Page 4 of 9 -------------------------------------------------------------------------------- Multi-Disciplinary Care Plan Details Patient Name: Date of Service: [name] 05/08/2023 9:30 A M Medical Record Number: [id number] Patient Account Number: [id number] Date of Birth/Sex: Treating RN: 1928-06-24 (89 y.o. Katrinka Blazing Primary Care Colton Tassin: Seabron Spates  Other Clinician: Referring Gracelynn Bircher: Treating Miron Marxen/Extender: Ranee Gosselin in Treatment: 1 Active Inactive Wound/Skin Impairment Nursing Diagnoses: Impaired tissue integrity Goals: Patient/caregiver will verbalize understanding of skin care regimen Date Initiated: 05/01/2023 Target Resolution Date: 07/25/2023 Goal Status: Active Interventions: Assess ulceration(s) every visit Treatment Activities: Skin care regimen initiated : 05/01/2023 Notes: Electronic Signature(s) Signed: 05/08/2023 11:56:55 AM By: Karie Schwalbe RN Entered By: Karie Schwalbe on 05/08/2023 11:36:26 -------------------------------------------------------------------------------- Pain Assessment Details Patient Name: Date of Service: [name] 05/08/2023 9:30 A M Medical Record Number: [id number] Patient Account Number: [id number] Date of Birth/Sex: Treating RN: 09/19/1927 (89 y.o. F) Primary Care Amoree Newlon: Seabron Spates Other Clinician: Referring Worthy Boschert: Treating Dejae Bernet/Extender: Ranee Gosselin in Treatment: 1 Active Problems Location of Pain Severity and Description of Pain Patient Has Paino No Site Locations [place] ([id number]) [id number] Page 5 of 9 Pain Management and Medication Current Pain Management: Electronic Signature(s) Signed: 05/08/2023 11:49:10 AM By: Thayer Dallas Entered By: Louanne Skye,  Anemia, Hypotension, N/A Comorbid History: Myocardial Infarction Myocardial Infarction 04/05/2023 04/05/2023 N/A Date A cquired: 1 1 N/A Weeks of Treatment: Open Open N/A Wound Status: No No N/A Wound Recurrence: 0.3x0.8x0.1 1.4x1.3x0.1 N/A Measurements L x W x D (cm) 0.188 1.429 N/A A (cm) : rea 0.019 0.143 N/A Volume (cm) : 60.10% 58.70% N/A % Reduction in A rea: 59.60% 58.70% N/A % Reduction in Volume: Full Thickness Without Exposed Full Thickness Without Exposed N/A Classification: Support Structures Support Structures Medium Medium N/A Exudate A mount: Serosanguineous Serosanguineous N/A Exudate Type: red, brown red, brown N/A Exudate Color: Distinct, outline attached Fibrotic scar, thickened scar N/A Wound Margin: None Present (0%) Medium (34-66%) N/A Granulation A mount: N/A Red N/A Granulation Quality: Large (67-100%) Small (1-33%) N/A Necrotic A mount: Eschar, Adherent Slough Adherent Slough N/A Necrotic Tissue: Fat Layer (Subcutaneous Tissue): Yes Fat Layer (Subcutaneous Tissue): Yes N/A Exposed Structures: Fascia: No Fascia: No Tendon: No Tendon:  No Muscle: No Muscle: No Joint: No Joint: No Bone: No Bone: No None Small (1-33%) N/A Epithelialization: N/A Debridement - Selective/Open Wound N/A Debridement: N/A Lidocaine 4% T opical Solution N/A Pain Control: N/A Slough N/A Tissue Debrided: N/A Skin/Epidermis N/A Level: N/A 1.43 N/A Debridement A (sq cm): rea N/A Curette N/A Instrument: N/A Minimum N/A Bleeding: N/A Pressure N/A Hemostasis A chieved: N/A 0 N/A Procedural Pain: N/A 0 N/A Post Procedural Pain: Debridement Treatment Response: N/A Procedure was tolerated well N/A Post Debridement Measurements L x N/A 1.4x1.3x0.1 N/A W x D (cm) N/A 0.143 N/A Post Debridement Volume: (cm) Excoriation: No Excoriation: No N/A Periwound Skin Texture: Induration: No Induration: No Callus: No Callus: No Crepitus: No Crepitus: No Rash: No Rash: No Scarring: No Scarring: No Maceration: No Maceration: No N/A Periwound Skin Moisture: Dry/Scaly: No Dry/Scaly: No Atrophie Blanche: No Hemosiderin Staining: Yes N/A Periwound Skin Color: Cyanosis: No Atrophie Blanche: No Ecchymosis: No Cyanosis: No Erythema: No Ecchymosis: No Hemosiderin Staining: No Erythema: No Mottled: No Mottled: No Pallor: No Pallor: No Rubor: No Rubor: No No Abnormality No Abnormality N/A Temperature: N/A Debridement N/A Procedures Performed: Treatment Notes Electronic Signature(s) Signed: 05/08/2023 12:33:51 PM By: Geralyn Corwin DO Entered By: Geralyn Corwin on 05/08/2023 10:18:59 [name] ([id number]) [id number] Page 4 of 9 -------------------------------------------------------------------------------- Multi-Disciplinary Care Plan Details Patient Name: Date of Service: [name] 05/08/2023 9:30 A M Medical Record Number: [id number] Patient Account Number: [id number] Date of Birth/Sex: Treating RN: 1928-06-24 (89 y.o. Katrinka Blazing Primary Care Colton Tassin: Seabron Spates  Other Clinician: Referring Gracelynn Bircher: Treating Miron Marxen/Extender: Ranee Gosselin in Treatment: 1 Active Inactive Wound/Skin Impairment Nursing Diagnoses: Impaired tissue integrity Goals: Patient/caregiver will verbalize understanding of skin care regimen Date Initiated: 05/01/2023 Target Resolution Date: 07/25/2023 Goal Status: Active Interventions: Assess ulceration(s) every visit Treatment Activities: Skin care regimen initiated : 05/01/2023 Notes: Electronic Signature(s) Signed: 05/08/2023 11:56:55 AM By: Karie Schwalbe RN Entered By: Karie Schwalbe on 05/08/2023 11:36:26 -------------------------------------------------------------------------------- Pain Assessment Details Patient Name: Date of Service: [name] 05/08/2023 9:30 A M Medical Record Number: [id number] Patient Account Number: [id number] Date of Birth/Sex: Treating RN: 09/19/1927 (89 y.o. F) Primary Care Amoree Newlon: Seabron Spates Other Clinician: Referring Worthy Boschert: Treating Dejae Bernet/Extender: Ranee Gosselin in Treatment: 1 Active Problems Location of Pain Severity and Description of Pain Patient Has Paino No Site Locations [place], [place] ([id number]) [id number] Page 5 of 9 Pain Management and Medication Current Pain Management: Electronic Signature(s) Signed: 05/08/2023 11:49:10 AM By: Thayer Dallas Entered By: Louanne Skye,  [name] in Treatment: 1 Wound Status Wound Number: 4 Primary Etiology: Trauma, Other Wound Location: Right, Distal, Anterior Lower Leg Wound Status: Open Wounding Event: Hematoma Comorbid History: Cataracts, Anemia, Hypotension, Myocardial Infarction Date Acquired: 04/05/2023 Weeks Of Treatment: 1 Clustered Wound: No Photos Wound Measurements [name] ([id number]) Length: (cm) 1.4 Width: (cm) 1.3 Depth: (cm) 0.1 Area: (cm) 1.429 Volume: (cm) 0.143 [id number] Page 8 of 9 % Reduction in Area: 58.7% % Reduction in Volume: 58.7% Epithelialization: Small (1-33%) Tunneling: No Undermining: No Wound Description Classification: Full Thickness Without Exposed Support Structures Wound Margin: Fibrotic scar, thickened scar Exudate Amount: Medium Exudate Type: Serosanguineous Exudate Color: red, brown Foul Odor After Cleansing: No Slough/Fibrino Yes Wound Bed Granulation Amount: Medium (34-66%) Exposed Structure Granulation Quality: Red Fascia Exposed: No Necrotic Amount: Small (1-33%) Fat Layer (Subcutaneous Tissue) Exposed: Yes Necrotic Quality: Adherent Slough Tendon Exposed: No Muscle Exposed: No Joint Exposed: No Bone Exposed: No Periwound Skin Texture Texture Color No Abnormalities Noted: No No Abnormalities Noted: No Callus: No Atrophie Blanche: No Crepitus: No Cyanosis: No Excoriation: No Ecchymosis: No Induration: No Erythema: No Rash: No Hemosiderin Staining: Yes Scarring:  No Mottled: No Pallor: No Moisture Rubor: No No Abnormalities Noted: Yes Temperature / Pain Temperature: No Abnormality Treatment Notes Wound #4 (Lower Leg) Wound Laterality: Right, Anterior, Distal Cleanser Soap and Water Discharge Instruction: May shower and wash wound with dial antibacterial soap and water prior to dressing change. Vashe 5.8 (oz) Discharge Instruction: Cleanse the wound with Vashe prior to applying a clean dressing using gauze sponges, not tissue or cotton balls. Peri-Wound Care Sween Lotion (Moisturizing lotion) Discharge Instruction: Apply moisturizing lotion as directed Topical Gentamicin Discharge Instruction: As directed by physician Mupirocin Ointment Discharge Instruction: Apply Mupirocin (Bactroban) as instructed Primary Dressing PolyMem Silver Non-Adhesive Dressing, 4.25x4.25 in Discharge Instruction: Apply to wound bed as instructed Secondary Dressing ABD Pad, 8x10 Discharge Instruction: Apply over primary dressing as directed. Woven Gauze Sponge, Non-Sterile 4x4 in Discharge Instruction: Apply over primary dressing as directed. Secured With Transpore Surgical Tape, 2x10 (in/yd) Discharge Instruction: Secure dressing with tape as directed. Compression Wrap Kerlix Roll 4.5x3.1 (in/yd) Discharge Instruction: Apply Kerlix and Coban compression as directed. [name] ([id number]) [id number] Page 9 of 9 Coban Self-Adherent Wrap 4x5 (in/yd) Discharge Instruction: Apply over Kerlix as directed. Tubular netting #5 Compression Stockings Add-Ons Electronic Signature(s) Signed: 05/08/2023 11:56:55 AM By: Karie Schwalbe RN Entered By: Karie Schwalbe on 05/08/2023 10:15:35 -------------------------------------------------------------------------------- Vitals Details Patient Name: Date of Service: [name] 05/08/2023 9:30 A M Medical Record Number: [id number] Patient Account Number: [id number] Date of  Birth/Sex: Treating RN: 07/18/1928 (89 y.o. F) Primary Care Zacary Bauer: Seabron Spates Other Clinician: Referring Reda Citron: Treating Lasonia Casino/Extender: Ranee Gosselin in Treatment: 1 Vital Signs Time Taken: 09:48 Temperature (F): 98.4 Height (in): 62 Pulse (bpm): 79 Weight (lbs): 85 Respiratory Rate (breaths/min): 16 Body Mass Index (BMI): 15.5 Blood Pressure (mmHg): 126/62 Reference Range: 80 - 120 mg / dl Electronic Signature(s) Signed: 05/08/2023 11:49:10 AM By: Thayer Dallas Entered By: Thayer Dallas on 05/08/2023 09:49:08

## 2023-05-15 ENCOUNTER — Encounter (HOSPITAL_BASED_OUTPATIENT_CLINIC_OR_DEPARTMENT_OTHER): Payer: Medicare Other | Admitting: General Surgery

## 2023-05-15 DIAGNOSIS — S81801A Unspecified open wound, right lower leg, initial encounter: Secondary | ICD-10-CM | POA: Diagnosis not present

## 2023-05-15 DIAGNOSIS — L97812 Non-pressure chronic ulcer of other part of right lower leg with fat layer exposed: Secondary | ICD-10-CM | POA: Diagnosis not present

## 2023-05-15 DIAGNOSIS — I2510 Atherosclerotic heart disease of native coronary artery without angina pectoris: Secondary | ICD-10-CM | POA: Diagnosis not present

## 2023-05-15 DIAGNOSIS — W2203XA Walked into furniture, initial encounter: Secondary | ICD-10-CM | POA: Diagnosis not present

## 2023-05-15 DIAGNOSIS — T798XXA Other early complications of trauma, initial encounter: Secondary | ICD-10-CM | POA: Diagnosis not present

## 2023-05-15 DIAGNOSIS — Z87891 Personal history of nicotine dependence: Secondary | ICD-10-CM | POA: Diagnosis not present

## 2023-05-15 DIAGNOSIS — I87311 Chronic venous hypertension (idiopathic) with ulcer of right lower extremity: Secondary | ICD-10-CM | POA: Diagnosis not present

## 2023-05-15 DIAGNOSIS — J449 Chronic obstructive pulmonary disease, unspecified: Secondary | ICD-10-CM | POA: Diagnosis not present

## 2023-05-15 NOTE — Progress Notes (Signed)
wounds. She has some degree of chronic venous insufficiency with hemosiderin deposition. She did not have significant arterial studies by results we did in this clinic 05/01/2023 Ms. [name] is a 89 year old female with a past medical history of COPD and venous insufficiency that presents to the clinic for a 1 month history of nonhealing wound to the right lower extremity. She reports hitting her shin against a stepstool creating a wound. She has been keeping the area covered. She has been following with her primary care physician for this issue and has been treated with Keflex and levofloxacin for this issue. She currently denies signs of infection. She does not wear compression stockings. 9/13; patient presents for follow-up. We have been using antibiotic ointment with PolyMem under Kerlix/Coban to the right lower extremity. Wounds are smaller. 05/15/2023: Both wounds are substantially smaller. Edema control is good. There is a little bit of eschar on the surface of the proximal wound and some slough on the distal wound. Electronic Signature(s) Signed: 05/15/2023 8:27:01 AM By: Duanne Guess MD FACS Entered By: Duanne Guess on 05/15/2023  05:27:01 -------------------------------------------------------------------------------- Physical Exam Details Patient Name: Date of Service: [name] 05/15/2023 8:15 A M Medical Record Number: [id number] Patient Account Number: [id number] Date of Birth/Sex: Treating RN: 1928-03-26 (89 y.o. F) Primary Care Provider: Seabron Spates Other Clinician: Referring Provider: Treating Provider/Extender: Verner Mould in Treatment: 2 Constitutional . . . . no acute distress. Respiratory Normal work of breathing on room air. Lindsey, [name] ([id number]) [id number] Page 4 of 11 05/15/2023: Both wounds are substantially smaller. Edema control is good. There is a little bit of eschar on the surface of the proximal wound and some slough on the distal wound. Electronic Signature(s) Signed: 05/15/2023 8:27:37 AM By: Duanne Guess MD FACS Entered By: Duanne Guess on 05/15/2023 05:27:37 -------------------------------------------------------------------------------- Physician Orders Details Patient Name: Date of Service: [name] 05/15/2023 8:15 A M Medical Record Number: [id number] Patient Account Number: [id number] Date of Birth/Sex: Treating RN: 05-Apr-1928 (89 y.o. [name] Primary Care Provider: Seabron Spates Other Clinician: Referring Provider: Treating Provider/Extender: Verner Mould in Treatment: 2 The following information was scribed by: Samuella Bruin The information was scribed for: Duanne Guess Verbal / Phone Orders: No Diagnosis Coding ICD-10 Coding Code Description [phone] Non-pressure chronic ulcer of other part of right lower leg with fat layer exposed I87.311 Chronic venous hypertension (idiopathic) with ulcer of right lower extremity J44.9 Chronic obstructive pulmonary disease, unspecified T79.8XXD Other early complications of  trauma, subsequent encounter Follow-up Appointments ppointment in 1 week. - Dr. Lady Gary Room 2 Return A Anesthetic (In clinic) Topical Lidocaine 4% applied to wound bed Bathing/ Shower/ Hygiene May shower with protection but do not get wound dressing(s) wet. Protect dressing(s) with water repellant cover (for example, large plastic bag) or a cast cover and may then take shower. - Please cover right leg if showering. Please keep the right leg wraps dry Edema Control - Lymphedema / SCD / Other Right Lower Extremity Elevate legs to the level of the heart or above for 30 minutes daily and/or when sitting for 3-4 times a day throughout the day. Avoid standing for long periods of time. Exercise regularly - As tolerated Wound Treatment Wound #3 - Lower Leg Wound Laterality: Right, Anterior, Proximal Cleanser: Soap and Water 1 x Per Week/30 Days Discharge Instructions: May shower and wash wound with dial antibacterial soap and water prior to dressing change. Cleanser: Vashe 5.8 (  on the surface of the proximal wound and some slough on the distal wound. I used a curette to debride eschar from the proximal wound, which revealed to very small residual openings.  I debrided slough from the distal wound. We will continue the mixture of topical gentamicin and mupirocin with PolyMem Ag, Kerlix and Coban wrap. Follow-up in 1 week. Electronic Signature(s) Signed: 05/15/2023 8:28:38 AM By: Duanne Guess MD FACS Entered By: Duanne Guess on 05/15/2023 05:28:38 -------------------------------------------------------------------------------- HxROS Details Patient Name: Date of Service: [name] 05/15/2023 8:15 A M Medical Record Number: [id number] Patient Account Number: [id number] Date of Birth/Sex: Treating RN: 01/25/1928 (89 y.o. 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 ; partial hearing with hearing aide on the left Hematologic/Lymphatic Medical History: Positive for: Anemia - s/p heart surgery Cardiovascular Medical History: Positive for: Hypotension; Myocardial Infarction Past Medical History Notes: atherosclerosis Endocrine Medical History: Past Medical History Notes: hypothyroid Integumentary (Skin) Medical History: Negative for: History of Burn Past Medical History Notes: wound of right Lower leg Musculoskeletal Medical History: Negative for: Gout; Rheumatoid Arthritis; Osteoarthritis; Osteomyelitis Oncologic Medical History: Negative for: Received Chemotherapy; Received Radiation Past Medical History Notes: 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 (no chemo or radiation for this) Psychiatric Medical History: Past Medical History Notes: depression HBO Extended History Items Eyes: Cataracts Immunizations Pneumococcal Vaccine: Received Pneumococcal Vaccination: Yes Received Pneumococcal Vaccination On or After 60th Birthday: Yes Tetanus Vaccine: Last tetanus shot: 06/08/2016 Implantable Devices None Hospitalization / Surgery History Type of Hospitalization/Surgery renal mass ablation syncope Family and Social History Cancer: Yes - Siblings; Diabetes: No; Heart Disease: Yes - Mother; Hereditary Spherocytosis: No; Hypertension: Yes - Mother; Kidney Disease: Yes - Siblings; Lung Disease: No; Seizures: No; Stroke: No; Thyroid Problems: No; Tuberculosis: No; Former smoker - quit 60 years ago; Marital Status - Widowed; Alcohol Use: Never; Drug Use: No History; Caffeine Use: Daily - coffee; Financial Concerns: No; Food, Clothing or Shelter Needs: No; Support System Lacking: No; Transportation Concerns: No Lindsey, [name] ([id number]) [id number] Page 11 of 11 Electronic Signature(s) Signed: 05/15/2023 9:08:13 AM By: Duanne Guess MD FACS Entered By: Duanne Guess on 05/15/2023 05:27:10 -------------------------------------------------------------------------------- SuperBill Details Patient Name: Date of Service: [name] 05/15/2023 Medical Record Number: [id number] Patient Account Number: [id number] Date of Birth/Sex: Treating RN: 24-Jan-1928 (89 y.o. F) Primary Care Provider: Seabron Spates Other Clinician: Referring Provider: Treating Provider/Extender: Verner Mould in Treatment: 2 Diagnosis Coding ICD-10 Codes Code Description [phone] Non-pressure chronic ulcer of other part of right lower leg with fat layer exposed I87.311 Chronic venous hypertension (idiopathic) with ulcer of right lower extremity J44.9 Chronic obstructive pulmonary  disease, unspecified T79.8XXD Other early complications of trauma, subsequent encounter Facility Procedures : CPT4 Code: 01601093 Description: [phone] - DEBRIDE WOUND 1ST 20 SQ CM OR < ICD-10 Diagnosis Description L97.812 Non-pressure chronic ulcer of other part of right lower leg with fat layer expos Modifier: ed Quantity: 1 Physician Procedures : CPT4 Code Description Modifier [id number] - WC PHYS LEVEL 4 - EST PT 25 ICD-10 Diagnosis  on the surface of the proximal wound and some slough on the distal wound. I used a curette to debride eschar from the proximal wound, which revealed to very small residual openings.  I debrided slough from the distal wound. We will continue the mixture of topical gentamicin and mupirocin with PolyMem Ag, Kerlix and Coban wrap. Follow-up in 1 week. Electronic Signature(s) Signed: 05/15/2023 8:28:38 AM By: Duanne Guess MD FACS Entered By: Duanne Guess on 05/15/2023 05:28:38 -------------------------------------------------------------------------------- HxROS Details Patient Name: Date of Service: [name] 05/15/2023 8:15 A M Medical Record Number: [id number] Patient Account Number: [id number] Date of Birth/Sex: Treating RN: 01/25/1928 (89 y.o. 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 ; partial hearing with hearing aide on the left Hematologic/Lymphatic Medical History: Positive for: Anemia - s/p heart surgery Cardiovascular Medical History: Positive for: Hypotension; Myocardial Infarction Past Medical History Notes: atherosclerosis Endocrine Medical History: Past Medical History Notes: hypothyroid Integumentary (Skin) Medical History: Negative for: History of Burn Past Medical History Notes: wound of right Lower leg Musculoskeletal Medical History: Negative for: Gout; Rheumatoid Arthritis; Osteoarthritis; Osteomyelitis Oncologic Medical History: Negative for: Received Chemotherapy; Received Radiation Past Medical History Notes: 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 (no chemo or radiation for this) Psychiatric Medical History: Past Medical History Notes: depression HBO Extended History Items Eyes: Cataracts Immunizations Pneumococcal Vaccine: Received Pneumococcal Vaccination: Yes Received Pneumococcal Vaccination On or After 60th Birthday: Yes Tetanus Vaccine: Last tetanus shot: 06/08/2016 Implantable Devices None Hospitalization / Surgery History Type of Hospitalization/Surgery renal mass ablation syncope Family and Social History Cancer: Yes - Siblings; Diabetes: No; Heart Disease: Yes - Mother; Hereditary Spherocytosis: No; Hypertension: Yes - Mother; Kidney Disease: Yes - Siblings; Lung Disease: No; Seizures: No; Stroke: No; Thyroid Problems: No; Tuberculosis: No; Former smoker - quit 60 years ago; Marital Status - Widowed; Alcohol Use: Never; Drug Use: No History; Caffeine Use: Daily - coffee; Financial Concerns: No; Food, Clothing or Shelter Needs: No; Support System Lacking: No; Transportation Concerns: No Lindsey, [name] ([id number]) [id number] Page 11 of 11 Electronic Signature(s) Signed: 05/15/2023 9:08:13 AM By: Duanne Guess MD FACS Entered By: Duanne Guess on 05/15/2023 05:27:10 -------------------------------------------------------------------------------- SuperBill Details Patient Name: Date of Service: [name] 05/15/2023 Medical Record Number: [id number] Patient Account Number: [id number] Date of Birth/Sex: Treating RN: 24-Jan-1928 (89 y.o. F) Primary Care Provider: Seabron Spates Other Clinician: Referring Provider: Treating Provider/Extender: Verner Mould in Treatment: 2 Diagnosis Coding ICD-10 Codes Code Description [phone] Non-pressure chronic ulcer of other part of right lower leg with fat layer exposed I87.311 Chronic venous hypertension (idiopathic) with ulcer of right lower extremity J44.9 Chronic obstructive pulmonary  disease, unspecified T79.8XXD Other early complications of trauma, subsequent encounter Facility Procedures : CPT4 Code: 01601093 Description: [phone] - DEBRIDE WOUND 1ST 20 SQ CM OR < ICD-10 Diagnosis Description L97.812 Non-pressure chronic ulcer of other part of right lower leg with fat layer expos Modifier: ed Quantity: 1 Physician Procedures : CPT4 Code Description Modifier [id number] - WC PHYS LEVEL 4 - EST PT 25 ICD-10 Diagnosis  on the surface of the proximal wound and some slough on the distal wound. I used a curette to debride eschar from the proximal wound, which revealed to very small residual openings.  I debrided slough from the distal wound. We will continue the mixture of topical gentamicin and mupirocin with PolyMem Ag, Kerlix and Coban wrap. Follow-up in 1 week. Electronic Signature(s) Signed: 05/15/2023 8:28:38 AM By: Duanne Guess MD FACS Entered By: Duanne Guess on 05/15/2023 05:28:38 -------------------------------------------------------------------------------- HxROS Details Patient Name: Date of Service: [name] 05/15/2023 8:15 A M Medical Record Number: [id number] Patient Account Number: [id number] Date of Birth/Sex: Treating RN: 01/25/1928 (89 y.o. 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 ; partial hearing with hearing aide on the left Hematologic/Lymphatic Medical History: Positive for: Anemia - s/p heart surgery Cardiovascular Medical History: Positive for: Hypotension; Myocardial Infarction Past Medical History Notes: atherosclerosis Endocrine Medical History: Past Medical History Notes: hypothyroid Integumentary (Skin) Medical History: Negative for: History of Burn Past Medical History Notes: wound of right Lower leg Musculoskeletal Medical History: Negative for: Gout; Rheumatoid Arthritis; Osteoarthritis; Osteomyelitis Oncologic Medical History: Negative for: Received Chemotherapy; Received Radiation Past Medical History Notes: 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 (no chemo or radiation for this) Psychiatric Medical History: Past Medical History Notes: depression HBO Extended History Items Eyes: Cataracts Immunizations Pneumococcal Vaccine: Received Pneumococcal Vaccination: Yes Received Pneumococcal Vaccination On or After 60th Birthday: Yes Tetanus Vaccine: Last tetanus shot: 06/08/2016 Implantable Devices None Hospitalization / Surgery History Type of Hospitalization/Surgery renal mass ablation syncope Family and Social History Cancer: Yes - Siblings; Diabetes: No; Heart Disease: Yes - Mother; Hereditary Spherocytosis: No; Hypertension: Yes - Mother; Kidney Disease: Yes - Siblings; Lung Disease: No; Seizures: No; Stroke: No; Thyroid Problems: No; Tuberculosis: No; Former smoker - quit 60 years ago; Marital Status - Widowed; Alcohol Use: Never; Drug Use: No History; Caffeine Use: Daily - coffee; Financial Concerns: No; Food, Clothing or Shelter Needs: No; Support System Lacking: No; Transportation Concerns: No Lindsey, [name] ([id number]) [id number] Page 11 of 11 Electronic Signature(s) Signed: 05/15/2023 9:08:13 AM By: Duanne Guess MD FACS Entered By: Duanne Guess on 05/15/2023 05:27:10 -------------------------------------------------------------------------------- SuperBill Details Patient Name: Date of Service: [name] 05/15/2023 Medical Record Number: [id number] Patient Account Number: [id number] Date of Birth/Sex: Treating RN: 24-Jan-1928 (89 y.o. F) Primary Care Provider: Seabron Spates Other Clinician: Referring Provider: Treating Provider/Extender: Verner Mould in Treatment: 2 Diagnosis Coding ICD-10 Codes Code Description [phone] Non-pressure chronic ulcer of other part of right lower leg with fat layer exposed I87.311 Chronic venous hypertension (idiopathic) with ulcer of right lower extremity J44.9 Chronic obstructive pulmonary  disease, unspecified T79.8XXD Other early complications of trauma, subsequent encounter Facility Procedures : CPT4 Code: 01601093 Description: [phone] - DEBRIDE WOUND 1ST 20 SQ CM OR < ICD-10 Diagnosis Description L97.812 Non-pressure chronic ulcer of other part of right lower leg with fat layer expos Modifier: ed Quantity: 1 Physician Procedures : CPT4 Code Description Modifier [id number] - WC PHYS LEVEL 4 - EST PT 25 ICD-10 Diagnosis  wounds. She has some degree of chronic venous insufficiency with hemosiderin deposition. She did not have significant arterial studies by results we did in this clinic 05/01/2023 Ms. [name] is a 89 year old female with a past medical history of COPD and venous insufficiency that presents to the clinic for a 1 month history of nonhealing wound to the right lower extremity. She reports hitting her shin against a stepstool creating a wound. She has been keeping the area covered. She has been following with her primary care physician for this issue and has been treated with Keflex and levofloxacin for this issue. She currently denies signs of infection. She does not wear compression stockings. 9/13; patient presents for follow-up. We have been using antibiotic ointment with PolyMem under Kerlix/Coban to the right lower extremity. Wounds are smaller. 05/15/2023: Both wounds are substantially smaller. Edema control is good. There is a little bit of eschar on the surface of the proximal wound and some slough on the distal wound. Electronic Signature(s) Signed: 05/15/2023 8:27:01 AM By: Duanne Guess MD FACS Entered By: Duanne Guess on 05/15/2023  05:27:01 -------------------------------------------------------------------------------- Physical Exam Details Patient Name: Date of Service: [name] 05/15/2023 8:15 A M Medical Record Number: [id number] Patient Account Number: [id number] Date of Birth/Sex: Treating RN: 1928-03-26 (89 y.o. F) Primary Care Provider: Seabron Spates Other Clinician: Referring Provider: Treating Provider/Extender: Verner Mould in Treatment: 2 Constitutional . . . . no acute distress. Respiratory Normal work of breathing on room air. Lindsey, [name] ([id number]) [id number] Page 4 of 11 05/15/2023: Both wounds are substantially smaller. Edema control is good. There is a little bit of eschar on the surface of the proximal wound and some slough on the distal wound. Electronic Signature(s) Signed: 05/15/2023 8:27:37 AM By: Duanne Guess MD FACS Entered By: Duanne Guess on 05/15/2023 05:27:37 -------------------------------------------------------------------------------- Physician Orders Details Patient Name: Date of Service: [name] 05/15/2023 8:15 A M Medical Record Number: [id number] Patient Account Number: [id number] Date of Birth/Sex: Treating RN: 05-Apr-1928 (89 y.o. [name] Primary Care Provider: Seabron Spates Other Clinician: Referring Provider: Treating Provider/Extender: Verner Mould in Treatment: 2 The following information was scribed by: Samuella Bruin The information was scribed for: Duanne Guess Verbal / Phone Orders: No Diagnosis Coding ICD-10 Coding Code Description [phone] Non-pressure chronic ulcer of other part of right lower leg with fat layer exposed I87.311 Chronic venous hypertension (idiopathic) with ulcer of right lower extremity J44.9 Chronic obstructive pulmonary disease, unspecified T79.8XXD Other early complications of  trauma, subsequent encounter Follow-up Appointments ppointment in 1 week. - Dr. Lady Gary Room 2 Return A Anesthetic (In clinic) Topical Lidocaine 4% applied to wound bed Bathing/ Shower/ Hygiene May shower with protection but do not get wound dressing(s) wet. Protect dressing(s) with water repellant cover (for example, large plastic bag) or a cast cover and may then take shower. - Please cover right leg if showering. Please keep the right leg wraps dry Edema Control - Lymphedema / SCD / Other Right Lower Extremity Elevate legs to the level of the heart or above for 30 minutes daily and/or when sitting for 3-4 times a day throughout the day. Avoid standing for long periods of time. Exercise regularly - As tolerated Wound Treatment Wound #3 - Lower Leg Wound Laterality: Right, Anterior, Proximal Cleanser: Soap and Water 1 x Per Week/30 Days Discharge Instructions: May shower and wash wound with dial antibacterial soap and water prior to dressing change. Cleanser: Vashe 5.8 (  YOSHIYE, MOFFO ([id number]) [id number] Page 1 of 11 Visit Report for 05/15/2023 Chief Complaint Document Details Patient Name: Date of Service: [name] 05/15/2023 8:15 A M Medical Record Number: [id number] Patient Account Number: [id number] Date of Birth/Sex: Treating RN: Aug 17, 1928 (89 y.o. F) Primary Care Provider: Seabron Spates Other Clinician: Referring Provider: Treating Provider/Extender: Verner Mould in Treatment: 2 Information Obtained from: Patient Chief Complaint 07/26/2018; patient is here for review of laceration injuries on the left anterior tibia area 05/01/2023; patient presents for right lower extremity wounds in the setting of trauma Electronic Signature(s) Signed: 05/15/2023 8:26:14 AM By: Duanne Guess MD FACS Entered By: Duanne Guess on 05/15/2023 05:26:14 -------------------------------------------------------------------------------- Debridement Details Patient Name: Date of Service: [name] 05/15/2023 8:15 A M Medical Record Number: [id number] Patient Account Number: [id number] Date of Birth/Sex: Treating RN: 1928/03/02 (89 y.o. [name] Primary Care Provider: Seabron Spates Other Clinician: Referring Provider: Treating Provider/Extender: Verner Mould in Treatment: 2 Debridement Performed for Assessment: Wound #4 Right,Distal,Anterior Lower Leg Performed By: Physician Duanne Guess, MD The following information was scribed by: Samuella Bruin The information was scribed for: Duanne Guess Debridement Type: Debridement Level of Consciousness (Pre-procedure): Awake and Alert Pre-procedure Verification/Time Out Yes - 08:22 Taken: Start Time: 08:22 Pain Control: Lidocaine 4% T opical Solution Percent of Wound Bed Debrided: 100% T Area Debrided (cm): otal 0.95 Tissue and other material debrided: Non-Viable, Slough,  Slough Level: Non-Viable Tissue Debridement Description: Selective/Open Wound Instrument: Curette Bleeding: Minimum Hemostasis Achieved: Pressure Response to Treatment: Procedure was tolerated well Level of Consciousness (Post- Awake and Alert procedure): Post Debridement Measurements of Total Wound Length: (cm) 1.1 Width: (cm) 1.1 Depth: (cm) 0.1 Volume: (cm) 0.095 Character of Wound/Ulcer Post Debridement: Improved Lindsey, [name] ([id number]) [id number] Page 2 of 11 Post Procedure Diagnosis Same as Pre-procedure Electronic Signature(s) Signed: 05/15/2023 9:08:13 AM By: Duanne Guess MD FACS Signed: 05/15/2023 11:42:46 AM By: Samuella Bruin Entered By: Samuella Bruin on 05/15/2023 05:22:56 -------------------------------------------------------------------------------- Debridement Details Patient Name: Date of Service: [name] 05/15/2023 8:15 A M Medical Record Number: [id number] Patient Account Number: [id number] Date of Birth/Sex: Treating RN: 28-Apr-1928 (89 y.o. [name] Primary Care Provider: Seabron Spates Other Clinician: Referring Provider: Treating Provider/Extender: Verner Mould in Treatment: 2 Debridement Performed for Assessment: Wound #3 Right,Proximal,Anterior Lower Leg Performed By: Physician Duanne Guess, MD The following information was scribed by: Samuella Bruin The information was scribed for: Duanne Guess Debridement Type: Debridement Level of Consciousness (Pre-procedure): Awake and Alert Pre-procedure Verification/Time Out Yes - 08:22 Taken: Start Time: 08:22 Pain Control: Lidocaine 4% Topical Solution Percent of Wound Bed Debrided: 100% T Area Debrided (cm): otal 0.11 Tissue and other material debrided: Non-Viable, Eschar Level: Non-Viable Tissue Debridement Description: Selective/Open Wound Instrument: Curette Bleeding:  Minimum Hemostasis Achieved: Pressure Response to Treatment: Procedure was tolerated well Level of Consciousness (Post- Awake and Alert procedure): Post Debridement Measurements of Total Wound Length: (cm) 0.2 Width: (cm) 0.7 Depth: (cm) 0.1 Volume: (cm) 0.011 Character of Wound/Ulcer Post Debridement: Improved Post Procedure Diagnosis Same as Pre-procedure Electronic Signature(s) Signed: 05/15/2023 9:08:13 AM By: Duanne Guess MD FACS Signed: 05/15/2023 11:42:46 AM By: Samuella Bruin Entered By: Samuella Bruin on 05/15/2023 05:23:35 -------------------------------------------------------------------------------- HPI Details Patient Name: Date of Service: HO WELL, Kandice Robinsons. 05/15/2023 8:15 A M Medical Record Number: [id number] Patient Account Number: [id number] Lindsey, [name] ([id number]) [id number] Page 3 of 11 Date of Birth/Sex: Treating RN: [birth year] (  wounds. She has some degree of chronic venous insufficiency with hemosiderin deposition. She did not have significant arterial studies by results we did in this clinic 05/01/2023 Ms. [name] is a 89 year old female with a past medical history of COPD and venous insufficiency that presents to the clinic for a 1 month history of nonhealing wound to the right lower extremity. She reports hitting her shin against a stepstool creating a wound. She has been keeping the area covered. She has been following with her primary care physician for this issue and has been treated with Keflex and levofloxacin for this issue. She currently denies signs of infection. She does not wear compression stockings. 9/13; patient presents for follow-up. We have been using antibiotic ointment with PolyMem under Kerlix/Coban to the right lower extremity. Wounds are smaller. 05/15/2023: Both wounds are substantially smaller. Edema control is good. There is a little bit of eschar on the surface of the proximal wound and some slough on the distal wound. Electronic Signature(s) Signed: 05/15/2023 8:27:01 AM By: Duanne Guess MD FACS Entered By: Duanne Guess on 05/15/2023  05:27:01 -------------------------------------------------------------------------------- Physical Exam Details Patient Name: Date of Service: [name] 05/15/2023 8:15 A M Medical Record Number: [id number] Patient Account Number: [id number] Date of Birth/Sex: Treating RN: 1928-03-26 (89 y.o. F) Primary Care Provider: Seabron Spates Other Clinician: Referring Provider: Treating Provider/Extender: Verner Mould in Treatment: 2 Constitutional . . . . no acute distress. Respiratory Normal work of breathing on room air. Lindsey, [name] ([id number]) [id number] Page 4 of 11 05/15/2023: Both wounds are substantially smaller. Edema control is good. There is a little bit of eschar on the surface of the proximal wound and some slough on the distal wound. Electronic Signature(s) Signed: 05/15/2023 8:27:37 AM By: Duanne Guess MD FACS Entered By: Duanne Guess on 05/15/2023 05:27:37 -------------------------------------------------------------------------------- Physician Orders Details Patient Name: Date of Service: [name] 05/15/2023 8:15 A M Medical Record Number: [id number] Patient Account Number: [id number] Date of Birth/Sex: Treating RN: 05-Apr-1928 (89 y.o. [name] Primary Care Provider: Seabron Spates Other Clinician: Referring Provider: Treating Provider/Extender: Verner Mould in Treatment: 2 The following information was scribed by: Samuella Bruin The information was scribed for: Duanne Guess Verbal / Phone Orders: No Diagnosis Coding ICD-10 Coding Code Description [phone] Non-pressure chronic ulcer of other part of right lower leg with fat layer exposed I87.311 Chronic venous hypertension (idiopathic) with ulcer of right lower extremity J44.9 Chronic obstructive pulmonary disease, unspecified T79.8XXD Other early complications of  trauma, subsequent encounter Follow-up Appointments ppointment in 1 week. - Dr. Lady Gary Room 2 Return A Anesthetic (In clinic) Topical Lidocaine 4% applied to wound bed Bathing/ Shower/ Hygiene May shower with protection but do not get wound dressing(s) wet. Protect dressing(s) with water repellant cover (for example, large plastic bag) or a cast cover and may then take shower. - Please cover right leg if showering. Please keep the right leg wraps dry Edema Control - Lymphedema / SCD / Other Right Lower Extremity Elevate legs to the level of the heart or above for 30 minutes daily and/or when sitting for 3-4 times a day throughout the day. Avoid standing for long periods of time. Exercise regularly - As tolerated Wound Treatment Wound #3 - Lower Leg Wound Laterality: Right, Anterior, Proximal Cleanser: Soap and Water 1 x Per Week/30 Days Discharge Instructions: May shower and wash wound with dial antibacterial soap and water prior to dressing change. Cleanser: Vashe 5.8 (  YOSHIYE, MOFFO ([id number]) [id number] Page 1 of 11 Visit Report for 05/15/2023 Chief Complaint Document Details Patient Name: Date of Service: [name] 05/15/2023 8:15 A M Medical Record Number: [id number] Patient Account Number: [id number] Date of Birth/Sex: Treating RN: Aug 17, 1928 (89 y.o. F) Primary Care Provider: Seabron Spates Other Clinician: Referring Provider: Treating Provider/Extender: Verner Mould in Treatment: 2 Information Obtained from: Patient Chief Complaint 07/26/2018; patient is here for review of laceration injuries on the left anterior tibia area 05/01/2023; patient presents for right lower extremity wounds in the setting of trauma Electronic Signature(s) Signed: 05/15/2023 8:26:14 AM By: Duanne Guess MD FACS Entered By: Duanne Guess on 05/15/2023 05:26:14 -------------------------------------------------------------------------------- Debridement Details Patient Name: Date of Service: [name] 05/15/2023 8:15 A M Medical Record Number: [id number] Patient Account Number: [id number] Date of Birth/Sex: Treating RN: 1928/03/02 (89 y.o. [name] Primary Care Provider: Seabron Spates Other Clinician: Referring Provider: Treating Provider/Extender: Verner Mould in Treatment: 2 Debridement Performed for Assessment: Wound #4 Right,Distal,Anterior Lower Leg Performed By: Physician Duanne Guess, MD The following information was scribed by: Samuella Bruin The information was scribed for: Duanne Guess Debridement Type: Debridement Level of Consciousness (Pre-procedure): Awake and Alert Pre-procedure Verification/Time Out Yes - 08:22 Taken: Start Time: 08:22 Pain Control: Lidocaine 4% T opical Solution Percent of Wound Bed Debrided: 100% T Area Debrided (cm): otal 0.95 Tissue and other material debrided: Non-Viable, Slough,  Slough Level: Non-Viable Tissue Debridement Description: Selective/Open Wound Instrument: Curette Bleeding: Minimum Hemostasis Achieved: Pressure Response to Treatment: Procedure was tolerated well Level of Consciousness (Post- Awake and Alert procedure): Post Debridement Measurements of Total Wound Length: (cm) 1.1 Width: (cm) 1.1 Depth: (cm) 0.1 Volume: (cm) 0.095 Character of Wound/Ulcer Post Debridement: Improved Lindsey, [name] ([id number]) [id number] Page 2 of 11 Post Procedure Diagnosis Same as Pre-procedure Electronic Signature(s) Signed: 05/15/2023 9:08:13 AM By: Duanne Guess MD FACS Signed: 05/15/2023 11:42:46 AM By: Samuella Bruin Entered By: Samuella Bruin on 05/15/2023 05:22:56 -------------------------------------------------------------------------------- Debridement Details Patient Name: Date of Service: [name] 05/15/2023 8:15 A M Medical Record Number: [id number] Patient Account Number: [id number] Date of Birth/Sex: Treating RN: 28-Apr-1928 (89 y.o. [name] Primary Care Provider: Seabron Spates Other Clinician: Referring Provider: Treating Provider/Extender: Verner Mould in Treatment: 2 Debridement Performed for Assessment: Wound #3 Right,Proximal,Anterior Lower Leg Performed By: Physician Duanne Guess, MD The following information was scribed by: Samuella Bruin The information was scribed for: Duanne Guess Debridement Type: Debridement Level of Consciousness (Pre-procedure): Awake and Alert Pre-procedure Verification/Time Out Yes - 08:22 Taken: Start Time: 08:22 Pain Control: Lidocaine 4% Topical Solution Percent of Wound Bed Debrided: 100% T Area Debrided (cm): otal 0.11 Tissue and other material debrided: Non-Viable, Eschar Level: Non-Viable Tissue Debridement Description: Selective/Open Wound Instrument: Curette Bleeding:  Minimum Hemostasis Achieved: Pressure Response to Treatment: Procedure was tolerated well Level of Consciousness (Post- Awake and Alert procedure): Post Debridement Measurements of Total Wound Length: (cm) 0.2 Width: (cm) 0.7 Depth: (cm) 0.1 Volume: (cm) 0.011 Character of Wound/Ulcer Post Debridement: Improved Post Procedure Diagnosis Same as Pre-procedure Electronic Signature(s) Signed: 05/15/2023 9:08:13 AM By: Duanne Guess MD FACS Signed: 05/15/2023 11:42:46 AM By: Samuella Bruin Entered By: Samuella Bruin on 05/15/2023 05:23:35 -------------------------------------------------------------------------------- HPI Details Patient Name: Date of Service: HO WELL, Kandice Robinsons. 05/15/2023 8:15 A M Medical Record Number: [id number] Patient Account Number: [id number] Lindsey, [name] ([id number]) [id number] Page 3 of 11 Date of Birth/Sex: Treating RN: [birth year] (  on the surface of the proximal wound and some slough on the distal wound. I used a curette to debride eschar from the proximal wound, which revealed to very small residual openings.  I debrided slough from the distal wound. We will continue the mixture of topical gentamicin and mupirocin with PolyMem Ag, Kerlix and Coban wrap. Follow-up in 1 week. Electronic Signature(s) Signed: 05/15/2023 8:28:38 AM By: Duanne Guess MD FACS Entered By: Duanne Guess on 05/15/2023 05:28:38 -------------------------------------------------------------------------------- HxROS Details Patient Name: Date of Service: [name] 05/15/2023 8:15 A M Medical Record Number: [id number] Patient Account Number: [id number] Date of Birth/Sex: Treating RN: 01/25/1928 (89 y.o. 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 ; partial hearing with hearing aide on the left Hematologic/Lymphatic Medical History: Positive for: Anemia - s/p heart surgery Cardiovascular Medical History: Positive for: Hypotension; Myocardial Infarction Past Medical History Notes: atherosclerosis Endocrine Medical History: Past Medical History Notes: hypothyroid Integumentary (Skin) Medical History: Negative for: History of Burn Past Medical History Notes: wound of right Lower leg Musculoskeletal Medical History: Negative for: Gout; Rheumatoid Arthritis; Osteoarthritis; Osteomyelitis Oncologic Medical History: Negative for: Received Chemotherapy; Received Radiation Past Medical History Notes: 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 (no chemo or radiation for this) Psychiatric Medical History: Past Medical History Notes: depression HBO Extended History Items Eyes: Cataracts Immunizations Pneumococcal Vaccine: Received Pneumococcal Vaccination: Yes Received Pneumococcal Vaccination On or After 60th Birthday: Yes Tetanus Vaccine: Last tetanus shot: 06/08/2016 Implantable Devices None Hospitalization / Surgery History Type of Hospitalization/Surgery renal mass ablation syncope Family and Social History Cancer: Yes - Siblings; Diabetes: No; Heart Disease: Yes - Mother; Hereditary Spherocytosis: No; Hypertension: Yes - Mother; Kidney Disease: Yes - Siblings; Lung Disease: No; Seizures: No; Stroke: No; Thyroid Problems: No; Tuberculosis: No; Former smoker - quit 60 years ago; Marital Status - Widowed; Alcohol Use: Never; Drug Use: No History; Caffeine Use: Daily - coffee; Financial Concerns: No; Food, Clothing or Shelter Needs: No; Support System Lacking: No; Transportation Concerns: No Lindsey, [name] ([id number]) [id number] Page 11 of 11 Electronic Signature(s) Signed: 05/15/2023 9:08:13 AM By: Duanne Guess MD FACS Entered By: Duanne Guess on 05/15/2023 05:27:10 -------------------------------------------------------------------------------- SuperBill Details Patient Name: Date of Service: [name] 05/15/2023 Medical Record Number: [id number] Patient Account Number: [id number] Date of Birth/Sex: Treating RN: 24-Jan-1928 (89 y.o. F) Primary Care Provider: Seabron Spates Other Clinician: Referring Provider: Treating Provider/Extender: Verner Mould in Treatment: 2 Diagnosis Coding ICD-10 Codes Code Description [phone] Non-pressure chronic ulcer of other part of right lower leg with fat layer exposed I87.311 Chronic venous hypertension (idiopathic) with ulcer of right lower extremity J44.9 Chronic obstructive pulmonary  disease, unspecified T79.8XXD Other early complications of trauma, subsequent encounter Facility Procedures : CPT4 Code: [id number] Description: [phone] - DEBRIDE WOUND 1ST 20 SQ CM OR < ICD-10 Diagnosis Description L97.812 Non-pressure chronic ulcer of other part of right lower leg with fat layer expos Modifier: ed Quantity: 1 Physician Procedures : CPT4 Code Description Modifier [id number] - WC PHYS LEVEL 4 - EST PT 25 ICD-10 Diagnosis  wounds. She has some degree of chronic venous insufficiency with hemosiderin deposition. She did not have significant arterial studies by results we did in this clinic 05/01/2023 Ms. [name] is a 89 year old female with a past medical history of COPD and venous insufficiency that presents to the clinic for a 1 month history of nonhealing wound to the right lower extremity. She reports hitting her shin against a stepstool creating a wound. She has been keeping the area covered. She has been following with her primary care physician for this issue and has been treated with Keflex and levofloxacin for this issue. She currently denies signs of infection. She does not wear compression stockings. 9/13; patient presents for follow-up. We have been using antibiotic ointment with PolyMem under Kerlix/Coban to the right lower extremity. Wounds are smaller. 05/15/2023: Both wounds are substantially smaller. Edema control is good. There is a little bit of eschar on the surface of the proximal wound and some slough on the distal wound. Electronic Signature(s) Signed: 05/15/2023 8:27:01 AM By: Duanne Guess MD FACS Entered By: Duanne Guess on 05/15/2023  05:27:01 -------------------------------------------------------------------------------- Physical Exam Details Patient Name: Date of Service: [name] 05/15/2023 8:15 A M Medical Record Number: [id number] Patient Account Number: [id number] Date of Birth/Sex: Treating RN: 1928-03-26 (89 y.o. F) Primary Care Provider: Seabron Spates Other Clinician: Referring Provider: Treating Provider/Extender: Verner Mould in Treatment: 2 Constitutional . . . . no acute distress. Respiratory Normal work of breathing on room air. Lindsey, [name] ([id number]) [id number] Page 4 of 11 05/15/2023: Both wounds are substantially smaller. Edema control is good. There is a little bit of eschar on the surface of the proximal wound and some slough on the distal wound. Electronic Signature(s) Signed: 05/15/2023 8:27:37 AM By: Duanne Guess MD FACS Entered By: Duanne Guess on 05/15/2023 05:27:37 -------------------------------------------------------------------------------- Physician Orders Details Patient Name: Date of Service: [name] 05/15/2023 8:15 A M Medical Record Number: [id number] Patient Account Number: [id number] Date of Birth/Sex: Treating RN: 05-Apr-1928 (89 y.o. [name] Primary Care Provider: Seabron Spates Other Clinician: Referring Provider: Treating Provider/Extender: Verner Mould in Treatment: 2 The following information was scribed by: Samuella Bruin The information was scribed for: Duanne Guess Verbal / Phone Orders: No Diagnosis Coding ICD-10 Coding Code Description [phone] Non-pressure chronic ulcer of other part of right lower leg with fat layer exposed I87.311 Chronic venous hypertension (idiopathic) with ulcer of right lower extremity J44.9 Chronic obstructive pulmonary disease, unspecified T79.8XXD Other early complications of  trauma, subsequent encounter Follow-up Appointments ppointment in 1 week. - Dr. Lady Gary Room 2 Return A Anesthetic (In clinic) Topical Lidocaine 4% applied to wound bed Bathing/ Shower/ Hygiene May shower with protection but do not get wound dressing(s) wet. Protect dressing(s) with water repellant cover (for example, large plastic bag) or a cast cover and may then take shower. - Please cover right leg if showering. Please keep the right leg wraps dry Edema Control - Lymphedema / SCD / Other Right Lower Extremity Elevate legs to the level of the heart or above for 30 minutes daily and/or when sitting for 3-4 times a day throughout the day. Avoid standing for long periods of time. Exercise regularly - As tolerated Wound Treatment Wound #3 - Lower Leg Wound Laterality: Right, Anterior, Proximal Cleanser: Soap and Water 1 x Per Week/30 Days Discharge Instructions: May shower and wash wound with dial antibacterial soap and water prior to dressing change. Cleanser: Vashe 5.8 (

## 2023-05-15 NOTE — Progress Notes (Signed)
lotion as directed Topical Gentamicin Discharge Instruction: As directed by physician Mupirocin Ointment Discharge Instruction: Apply Mupirocin (Bactroban) as instructed Primary Dressing PolyMem Silver Non-Adhesive Dressing, 4.25x4.25 in Discharge Instruction: Apply to wound bed as instructed Secondary Dressing ABD Pad, 8x10 Discharge Instruction: Apply over primary dressing as directed. Woven Gauze Sponge, Non-Sterile 4x4 in Discharge Instruction: Apply over primary dressing as directed. Secured With Transpore Surgical Tape, 2x10  (in/yd) Discharge Instruction: Secure dressing with tape as directed. Compression Wrap Kerlix Roll 4.5x3.1 (in/yd) Discharge Instruction: Apply Kerlix and Coban compression as directed. Coban Self-Adherent Wrap 4x5 (in/yd) Discharge Instruction: Apply over Kerlix as directed. Tubular netting #5 Lindsey, [name] ([id number]) [id number] Page 5 of 10 Compression Stockings Add-Ons Electronic Signature(s) Signed: 05/15/2023 8:26:06 AM By: Duanne Guess MD FACS Entered By: Duanne Guess on 05/15/2023 05:26:06 -------------------------------------------------------------------------------- Multi-Disciplinary Care Plan Details Patient Name: Date of Service: [name] 05/15/2023 8:15 A M Medical Record Number: [id number] Patient Account Number: [id number] Date of Birth/Sex: Treating RN: May 07, 1928 (89 y.o. [name] Primary Care Zaviyar Rahal: Seabron Spates Other Clinician: Referring Cashe Gatt: Treating Krystyn Picking/Extender: Verner Mould in Treatment: 2 Active Inactive Wound/Skin Impairment Nursing Diagnoses: Impaired tissue integrity Goals: Patient/caregiver will verbalize understanding of skin care regimen Date Initiated: 05/01/2023 Target Resolution Date: 07/25/2023 Goal Status: Active Interventions: Assess ulceration(s) every visit Treatment Activities: Skin care regimen initiated : 05/01/2023 Notes: Electronic Signature(s) Signed: 05/15/2023 11:42:46 AM By: Samuella Bruin Entered By: Samuella Bruin on 05/15/2023 05:23:41 -------------------------------------------------------------------------------- Pain Assessment Details Patient Name: Date of Service: [name] 05/15/2023 8:15 A M Medical Record Number: [id number] Patient Account Number: [id number] Date of Birth/Sex: Treating RN: [birth year] (89 y.o. [name] Primary Care Shanell Aden: Seabron Spates Other  Clinician: Referring Analisia Kingsford: Treating Hallis Meditz/Extender: Verner Mould in Treatment: 2 Active Problems Location of Pain Severity and Description of Pain Patient Has Paino No Site Locations Rate the pain. [name], [name] ([id number]) [id number] Page 6 of 10 Rate the pain. Current Pain Level: 0 Pain Management and Medication Current Pain Management: Electronic Signature(s) Signed: 05/15/2023 11:42:46 AM By: Samuella Bruin Entered By: Samuella Bruin on 05/15/2023 05:10:58 -------------------------------------------------------------------------------- Patient/Caregiver Education Details Patient Name: Date of Service: [name] 9/20/2024andnbsp8:15 A M Medical Record Number: [id number] Patient Account Number: [id number] Date of Birth/Gender: Treating RN: 30-Sep-1927 (89 y.o. [name] Primary Care Physician: Seabron Spates Other Clinician: Referring Physician: Treating Physician/Extender: Verner Mould in Treatment: 2 Education Assessment Education Provided To: Patient Education Topics Provided Wound/Skin Impairment: Methods: Explain/Verbal Responses: Reinforcements needed, State content correctly Electronic Signature(s) Signed: 05/15/2023 11:42:46 AM By: Samuella Bruin Entered By: Samuella Bruin on 05/15/2023 05:23:50 -------------------------------------------------------------------------------- Wound Assessment Details Patient Name: Date of Service: [name] 05/15/2023 8:15 A M Medical Record Number: [id number] Patient Account Number: [id number] Date of Birth/Sex: Treating RN: 12/08/1927 (89 y.o. [name] Primary Care [name]: Seabron Spates Other Clinician: Referring Aleks Nawrot: Treating [name]/Extender: Ghita, [name] ([id number]) [id number] Page 7 of  10 Weeks in Treatment: 2 Wound Status Wound Number: 3 Primary Etiology: Trauma, Other Wound Location: Right, Proximal, Anterior Lower Leg Wound Status: Open Wounding Event: Hematoma Comorbid History: Cataracts, Anemia, Hypotension, Myocardial Infarction Date Acquired: 04/05/2023 Weeks Of Treatment: 2 Clustered Wound: No Photos Wound Measurements Length: (cm) 0.2 Width: (cm) 0.7 Depth: (cm) 0.1 Area: (cm) 0.11 Volume: (cm) 0.011 % Reduction in Area: 76.6% % Reduction in Volume: 76.6% Epithelialization: Large (67-100%) Tunneling: No Undermining: No Wound  lotion as directed Topical Gentamicin Discharge Instruction: As directed by physician Mupirocin Ointment Discharge Instruction: Apply Mupirocin (Bactroban) as instructed Primary Dressing PolyMem Silver Non-Adhesive Dressing, 4.25x4.25 in Discharge Instruction: Apply to wound bed as instructed Secondary Dressing ABD Pad, 8x10 Discharge Instruction: Apply over primary dressing as directed. Woven Gauze Sponge, Non-Sterile 4x4 in Discharge Instruction: Apply over primary dressing as directed. Secured With Transpore Surgical Tape, 2x10  (in/yd) Discharge Instruction: Secure dressing with tape as directed. Compression Wrap Kerlix Roll 4.5x3.1 (in/yd) Discharge Instruction: Apply Kerlix and Coban compression as directed. Coban Self-Adherent Wrap 4x5 (in/yd) Discharge Instruction: Apply over Kerlix as directed. Tubular netting #5 Lindsey, [name] ([id number]) [id number] Page 5 of 10 Compression Stockings Add-Ons Electronic Signature(s) Signed: 05/15/2023 8:26:06 AM By: Duanne Guess MD FACS Entered By: Duanne Guess on 05/15/2023 05:26:06 -------------------------------------------------------------------------------- Multi-Disciplinary Care Plan Details Patient Name: Date of Service: [name] 05/15/2023 8:15 A M Medical Record Number: [id number] Patient Account Number: [id number] Date of Birth/Sex: Treating RN: May 07, 1928 (89 y.o. [name] Primary Care Zaviyar Rahal: Seabron Spates Other Clinician: Referring Cashe Gatt: Treating Krystyn Picking/Extender: Verner Mould in Treatment: 2 Active Inactive Wound/Skin Impairment Nursing Diagnoses: Impaired tissue integrity Goals: Patient/caregiver will verbalize understanding of skin care regimen Date Initiated: 05/01/2023 Target Resolution Date: 07/25/2023 Goal Status: Active Interventions: Assess ulceration(s) every visit Treatment Activities: Skin care regimen initiated : 05/01/2023 Notes: Electronic Signature(s) Signed: 05/15/2023 11:42:46 AM By: Samuella Bruin Entered By: Samuella Bruin on 05/15/2023 05:23:41 -------------------------------------------------------------------------------- Pain Assessment Details Patient Name: Date of Service: [name] 05/15/2023 8:15 A M Medical Record Number: [id number] Patient Account Number: [id number] Date of Birth/Sex: Treating RN: [birth year] (89 y.o. [name] Primary Care Shanell Aden: Seabron Spates Other  Clinician: Referring Analisia Kingsford: Treating Hallis Meditz/Extender: Verner Mould in Treatment: 2 Active Problems Location of Pain Severity and Description of Pain Patient Has Paino No Site Locations Rate the pain. [name], [name] ([id number]) [id number] Page 6 of 10 Rate the pain. Current Pain Level: 0 Pain Management and Medication Current Pain Management: Electronic Signature(s) Signed: 05/15/2023 11:42:46 AM By: Samuella Bruin Entered By: Samuella Bruin on 05/15/2023 05:10:58 -------------------------------------------------------------------------------- Patient/Caregiver Education Details Patient Name: Date of Service: [name] 9/20/2024andnbsp8:15 A M Medical Record Number: [id number] Patient Account Number: [id number] Date of Birth/Gender: Treating RN: 30-Sep-1927 (89 y.o. [name] Primary Care Physician: Seabron Spates Other Clinician: Referring Physician: Treating Physician/Extender: Verner Mould in Treatment: 2 Education Assessment Education Provided To: Patient Education Topics Provided Wound/Skin Impairment: Methods: Explain/Verbal Responses: Reinforcements needed, State content correctly Electronic Signature(s) Signed: 05/15/2023 11:42:46 AM By: Samuella Bruin Entered By: Samuella Bruin on 05/15/2023 05:23:50 -------------------------------------------------------------------------------- Wound Assessment Details Patient Name: Date of Service: [name] 05/15/2023 8:15 A M Medical Record Number: [id number] Patient Account Number: [id number] Date of Birth/Sex: Treating RN: 12/08/1927 (89 y.o. [name] Primary Care [name]: Seabron Spates Other Clinician: Referring Aleks Nawrot: Treating [name]/Extender: Ghita, [name] ([id number]) [id number] Page 7 of  10 Weeks in Treatment: 2 Wound Status Wound Number: 3 Primary Etiology: Trauma, Other Wound Location: Right, Proximal, Anterior Lower Leg Wound Status: Open Wounding Event: Hematoma Comorbid History: Cataracts, Anemia, Hypotension, Myocardial Infarction Date Acquired: 04/05/2023 Weeks Of Treatment: 2 Clustered Wound: No Photos Wound Measurements Length: (cm) 0.2 Width: (cm) 0.7 Depth: (cm) 0.1 Area: (cm) 0.11 Volume: (cm) 0.011 % Reduction in Area: 76.6% % Reduction in Volume: 76.6% Epithelialization: Large (67-100%) Tunneling: No Undermining: No Wound  ESSIEMAE, MATER ([id number]) [id number] Page 1 of 10 Visit Report for 05/15/2023 Arrival Information Details Patient Name: Date of Service: [name] 05/15/2023 8:15 A M Medical Record Number: [id number] Patient Account Number: [id number] Date of Birth/Sex: Treating RN: 20-Apr-1928 (89 y.o. [name] Primary Care Concepcion Kirkpatrick: Seabron Spates Other Clinician: Referring Anusha Claus: Treating Monica Codd/Extender: Verner Mould in Treatment: 2 Visit Information History Since Last Visit Added or deleted any medications: No Patient Arrived: Wheel Chair Any new allergies or adverse reactions: No Arrival Time: 08:10 Had a fall or experienced change in No Accompanied By: daughter activities of daily living that may affect Transfer Assistance: Manual risk of falls: Patient Identification Verified: Yes Signs or symptoms of abuse/neglect since last visito No Secondary Verification Process Completed: Yes Hospitalized since last visit: No Patient Has Alerts: Yes Implantable device outside of the clinic excluding No Patient Alerts: R ABI [place] cellular tissue based products placed in the center since last visit: Has Dressing in Place as Prescribed: Yes Has Compression in Place as Prescribed: Yes Pain Present Now: No Electronic Signature(s) Signed: 05/15/2023 11:42:46 AM By: Samuella Bruin Entered By: Samuella Bruin on 05/15/2023 05:10:52 -------------------------------------------------------------------------------- Encounter Discharge Information Details Patient Name: Date of Service: [name] 05/15/2023 8:15 A M Medical Record Number: [id number] Patient Account Number: [id number] Date of Birth/Sex: Treating RN: 18-Jan-1928 (89 y.o. [name] Primary Care Chinaza Rooke: Seabron Spates Other Clinician: Referring Casmer Yepiz: Treating Qasim Diveley/Extender: Verner Mould in  Treatment: 2 Encounter Discharge Information Items Post Procedure Vitals Discharge Condition: Stable Temperature (F): 98.5 Ambulatory Status: Wheelchair Pulse (bpm): 73 Discharge Destination: Home Respiratory Rate (breaths/min): 16 Transportation: Private Auto Blood Pressure (mmHg): 125/54 Accompanied By: daughter Schedule Follow-up Appointment: Yes Clinical Summary of Care: Patient Declined Electronic Signature(s) Signed: 05/15/2023 11:42:46 AM By: Samuella Bruin Entered By: Samuella Bruin on 05/15/2023 05:24:33 [name] ([id number]) [id number] Page 2 of 10 -------------------------------------------------------------------------------- Lower Extremity Assessment Details Patient Name: Date of Service: [name] 05/15/2023 8:15 A M Medical Record Number: [id number] Patient Account Number: [id number] Date of Birth/Sex: Treating RN: October 20, 1927 (89 y.o. [name] Primary Care Morene Cecilio: Seabron Spates Other Clinician: Referring Nyesha Cliff: Treating Tanis Hensarling/Extender: Verner Mould in Treatment: 2 Edema Assessment Assessed: [Left: No] [Right: No] [Left: Edema] [Right: :] Calf Left: Right: Point of Measurement: 31 cm From Medial Instep 26 cm Ankle Left: Right: Point of Measurement: 8 cm From Medial Instep 19.8 cm Vascular Assessment Pulses: Dorsalis Pedis Palpable: [Right:Yes] Extremity colors, hair growth, and conditions: Extremity Color: [Right:Pale] Hair Growth on Extremity: [Right:No] Temperature of Extremity: [Right:Warm] Capillary Refill: [Right:> 3 seconds] Dependent Rubor: [Right:No No] Electronic Signature(s) Signed: 05/15/2023 11:42:46 AM By: Samuella Bruin Entered By: Samuella Bruin on 05/15/2023 05:14:23 -------------------------------------------------------------------------------- Multi Wound Chart Details Patient Name: Date of Service: [name]  05/15/2023 8:15 A M Medical Record Number: [id number] Patient Account Number: [id number] Date of Birth/Sex: Treating RN: [birth year] (89 y.o. F) Primary Care Kalyb Pemble: Seabron Spates Other Clinician: Referring Markez Dowland: Treating Jillian Pianka/Extender: Verner Mould in Treatment: 2 Vital Signs Height(in): 62 Pulse(bpm): 73 Weight(lbs): 85 Blood Pressure(mmHg): 125/54 Body Mass Index(BMI): 15.5 Temperature(F): 98.5 Respiratory Rate(breaths/min): 16 [3:Photos:] [N/A:N/A [id number] Page 3 of 10] Right, Proximal, Anterior Lower Leg Right, Distal, Anterior Lower Leg N/A Wound Location: Hematoma Hematoma N/A Wounding Event: Trauma, Other Trauma, Other N/A Primary Etiology: Cataracts, Anemia, Hypotension, Cataracts, Anemia, Hypotension, N/A Comorbid History: Myocardial Infarction Myocardial Infarction 04/05/2023 04/05/2023 N/A  ESSIEMAE, MATER ([id number]) [id number] Page 1 of 10 Visit Report for 05/15/2023 Arrival Information Details Patient Name: Date of Service: [name] 05/15/2023 8:15 A M Medical Record Number: [id number] Patient Account Number: [id number] Date of Birth/Sex: Treating RN: 20-Apr-1928 (89 y.o. [name] Primary Care Concepcion Kirkpatrick: Seabron Spates Other Clinician: Referring Anusha Claus: Treating Monica Codd/Extender: Verner Mould in Treatment: 2 Visit Information History Since Last Visit Added or deleted any medications: No Patient Arrived: Wheel Chair Any new allergies or adverse reactions: No Arrival Time: 08:10 Had a fall or experienced change in No Accompanied By: daughter activities of daily living that may affect Transfer Assistance: Manual risk of falls: Patient Identification Verified: Yes Signs or symptoms of abuse/neglect since last visito No Secondary Verification Process Completed: Yes Hospitalized since last visit: No Patient Has Alerts: Yes Implantable device outside of the clinic excluding No Patient Alerts: R ABI [place] cellular tissue based products placed in the center since last visit: Has Dressing in Place as Prescribed: Yes Has Compression in Place as Prescribed: Yes Pain Present Now: No Electronic Signature(s) Signed: 05/15/2023 11:42:46 AM By: Samuella Bruin Entered By: Samuella Bruin on 05/15/2023 05:10:52 -------------------------------------------------------------------------------- Encounter Discharge Information Details Patient Name: Date of Service: [name] 05/15/2023 8:15 A M Medical Record Number: [id number] Patient Account Number: [id number] Date of Birth/Sex: Treating RN: 18-Jan-1928 (89 y.o. [name] Primary Care Chinaza Rooke: Seabron Spates Other Clinician: Referring Casmer Yepiz: Treating Qasim Diveley/Extender: Verner Mould in  Treatment: 2 Encounter Discharge Information Items Post Procedure Vitals Discharge Condition: Stable Temperature (F): 98.5 Ambulatory Status: Wheelchair Pulse (bpm): 73 Discharge Destination: Home Respiratory Rate (breaths/min): 16 Transportation: Private Auto Blood Pressure (mmHg): 125/54 Accompanied By: daughter Schedule Follow-up Appointment: Yes Clinical Summary of Care: Patient Declined Electronic Signature(s) Signed: 05/15/2023 11:42:46 AM By: Samuella Bruin Entered By: Samuella Bruin on 05/15/2023 05:24:33 [name] ([id number]) [id number] Page 2 of 10 -------------------------------------------------------------------------------- Lower Extremity Assessment Details Patient Name: Date of Service: [name] 05/15/2023 8:15 A M Medical Record Number: [id number] Patient Account Number: [id number] Date of Birth/Sex: Treating RN: October 20, 1927 (89 y.o. [name] Primary Care Morene Cecilio: Seabron Spates Other Clinician: Referring Nyesha Cliff: Treating Tanis Hensarling/Extender: Verner Mould in Treatment: 2 Edema Assessment Assessed: [Left: No] [Right: No] [Left: Edema] [Right: :] Calf Left: Right: Point of Measurement: 31 cm From Medial Instep 26 cm Ankle Left: Right: Point of Measurement: 8 cm From Medial Instep 19.8 cm Vascular Assessment Pulses: Dorsalis Pedis Palpable: [Right:Yes] Extremity colors, hair growth, and conditions: Extremity Color: [Right:Pale] Hair Growth on Extremity: [Right:No] Temperature of Extremity: [Right:Warm] Capillary Refill: [Right:> 3 seconds] Dependent Rubor: [Right:No No] Electronic Signature(s) Signed: 05/15/2023 11:42:46 AM By: Samuella Bruin Entered By: Samuella Bruin on 05/15/2023 05:14:23 -------------------------------------------------------------------------------- Multi Wound Chart Details Patient Name: Date of Service: [name]  05/15/2023 8:15 A M Medical Record Number: [id number] Patient Account Number: [id number] Date of Birth/Sex: Treating RN: [birth year] (89 y.o. F) Primary Care Kalyb Pemble: Seabron Spates Other Clinician: Referring Markez Dowland: Treating Jillian Pianka/Extender: Verner Mould in Treatment: 2 Vital Signs Height(in): 62 Pulse(bpm): 73 Weight(lbs): 85 Blood Pressure(mmHg): 125/54 Body Mass Index(BMI): 15.5 Temperature(F): 98.5 Respiratory Rate(breaths/min): 16 [3:Photos:] [N/A:N/A [id number] Page 3 of 10] Right, Proximal, Anterior Lower Leg Right, Distal, Anterior Lower Leg N/A Wound Location: Hematoma Hematoma N/A Wounding Event: Trauma, Other Trauma, Other N/A Primary Etiology: Cataracts, Anemia, Hypotension, Cataracts, Anemia, Hypotension, N/A Comorbid History: Myocardial Infarction Myocardial Infarction 04/05/2023 04/05/2023 N/A  ESSIEMAE, MATER ([id number]) [id number] Page 1 of 10 Visit Report for 05/15/2023 Arrival Information Details Patient Name: Date of Service: [name] 05/15/2023 8:15 A M Medical Record Number: [id number] Patient Account Number: [id number] Date of Birth/Sex: Treating RN: 20-Apr-1928 (89 y.o. [name] Primary Care Concepcion Kirkpatrick: Seabron Spates Other Clinician: Referring Anusha Claus: Treating Monica Codd/Extender: Verner Mould in Treatment: 2 Visit Information History Since Last Visit Added or deleted any medications: No Patient Arrived: Wheel Chair Any new allergies or adverse reactions: No Arrival Time: 08:10 Had a fall or experienced change in No Accompanied By: daughter activities of daily living that may affect Transfer Assistance: Manual risk of falls: Patient Identification Verified: Yes Signs or symptoms of abuse/neglect since last visito No Secondary Verification Process Completed: Yes Hospitalized since last visit: No Patient Has Alerts: Yes Implantable device outside of the clinic excluding No Patient Alerts: R ABI [place] cellular tissue based products placed in the center since last visit: Has Dressing in Place as Prescribed: Yes Has Compression in Place as Prescribed: Yes Pain Present Now: No Electronic Signature(s) Signed: 05/15/2023 11:42:46 AM By: Samuella Bruin Entered By: Samuella Bruin on 05/15/2023 05:10:52 -------------------------------------------------------------------------------- Encounter Discharge Information Details Patient Name: Date of Service: [name] 05/15/2023 8:15 A M Medical Record Number: [id number] Patient Account Number: [id number] Date of Birth/Sex: Treating RN: 18-Jan-1928 (89 y.o. [name] Primary Care Chinaza Rooke: Seabron Spates Other Clinician: Referring Casmer Yepiz: Treating Qasim Diveley/Extender: Verner Mould in  Treatment: 2 Encounter Discharge Information Items Post Procedure Vitals Discharge Condition: Stable Temperature (F): 98.5 Ambulatory Status: Wheelchair Pulse (bpm): 73 Discharge Destination: Home Respiratory Rate (breaths/min): 16 Transportation: Private Auto Blood Pressure (mmHg): 125/54 Accompanied By: daughter Schedule Follow-up Appointment: Yes Clinical Summary of Care: Patient Declined Electronic Signature(s) Signed: 05/15/2023 11:42:46 AM By: Samuella Bruin Entered By: Samuella Bruin on 05/15/2023 05:24:33 [name] ([id number]) [id number] Page 2 of 10 -------------------------------------------------------------------------------- Lower Extremity Assessment Details Patient Name: Date of Service: [name] 05/15/2023 8:15 A M Medical Record Number: [id number] Patient Account Number: [id number] Date of Birth/Sex: Treating RN: October 20, 1927 (89 y.o. [name] Primary Care Morene Cecilio: Seabron Spates Other Clinician: Referring Nyesha Cliff: Treating Tanis Hensarling/Extender: Verner Mould in Treatment: 2 Edema Assessment Assessed: [Left: No] [Right: No] [Left: Edema] [Right: :] Calf Left: Right: Point of Measurement: 31 cm From Medial Instep 26 cm Ankle Left: Right: Point of Measurement: 8 cm From Medial Instep 19.8 cm Vascular Assessment Pulses: Dorsalis Pedis Palpable: [Right:Yes] Extremity colors, hair growth, and conditions: Extremity Color: [Right:Pale] Hair Growth on Extremity: [Right:No] Temperature of Extremity: [Right:Warm] Capillary Refill: [Right:> 3 seconds] Dependent Rubor: [Right:No No] Electronic Signature(s) Signed: 05/15/2023 11:42:46 AM By: Samuella Bruin Entered By: Samuella Bruin on 05/15/2023 05:14:23 -------------------------------------------------------------------------------- Multi Wound Chart Details Patient Name: Date of Service: [name]  05/15/2023 8:15 A M Medical Record Number: [id number] Patient Account Number: [id number] Date of Birth/Sex: Treating RN: [birth year] (89 y.o. F) Primary Care Kalyb Pemble: Seabron Spates Other Clinician: Referring Markez Dowland: Treating Jillian Pianka/Extender: Verner Mould in Treatment: 2 Vital Signs Height(in): 62 Pulse(bpm): 73 Weight(lbs): 85 Blood Pressure(mmHg): 125/54 Body Mass Index(BMI): 15.5 Temperature(F): 98.5 Respiratory Rate(breaths/min): 16 [3:Photos:] [N/A:N/A [id number] Page 3 of 10] Right, Proximal, Anterior Lower Leg Right, Distal, Anterior Lower Leg N/A Wound Location: Hematoma Hematoma N/A Wounding Event: Trauma, Other Trauma, Other N/A Primary Etiology: Cataracts, Anemia, Hypotension, Cataracts, Anemia, Hypotension, N/A Comorbid History: Myocardial Infarction Myocardial Infarction 04/05/2023 04/05/2023 N/A

## 2023-05-22 ENCOUNTER — Encounter (HOSPITAL_BASED_OUTPATIENT_CLINIC_OR_DEPARTMENT_OTHER): Payer: Medicare Other | Admitting: General Surgery

## 2023-05-22 DIAGNOSIS — T798XXA Other early complications of trauma, initial encounter: Secondary | ICD-10-CM | POA: Diagnosis not present

## 2023-05-22 DIAGNOSIS — Z87891 Personal history of nicotine dependence: Secondary | ICD-10-CM | POA: Diagnosis not present

## 2023-05-22 DIAGNOSIS — I87311 Chronic venous hypertension (idiopathic) with ulcer of right lower extremity: Secondary | ICD-10-CM | POA: Diagnosis not present

## 2023-05-22 DIAGNOSIS — S81801A Unspecified open wound, right lower leg, initial encounter: Secondary | ICD-10-CM | POA: Diagnosis not present

## 2023-05-22 DIAGNOSIS — W2203XA Walked into furniture, initial encounter: Secondary | ICD-10-CM | POA: Diagnosis not present

## 2023-05-22 DIAGNOSIS — S81812A Laceration without foreign body, left lower leg, initial encounter: Secondary | ICD-10-CM | POA: Diagnosis not present

## 2023-05-22 DIAGNOSIS — L97812 Non-pressure chronic ulcer of other part of right lower leg with fat layer exposed: Secondary | ICD-10-CM | POA: Diagnosis not present

## 2023-05-22 DIAGNOSIS — J449 Chronic obstructive pulmonary disease, unspecified: Secondary | ICD-10-CM | POA: Diagnosis not present

## 2023-05-22 DIAGNOSIS — I2510 Atherosclerotic heart disease of native coronary artery without angina pectoris: Secondary | ICD-10-CM | POA: Diagnosis not present

## 2023-05-22 NOTE — Progress Notes (Signed)
when sitting for 3-4 times a day throughout the day. Avoid standing for long periods of time. Exercise regularly - As tolerated Wound Treatment Wound #4 - Lower Leg Wound Laterality: Right, Anterior, Distal Cleanser: Soap and Water 1 x Per Week/30 Days Discharge Instructions: May shower and wash wound with dial antibacterial soap and water prior to dressing change. Cleanser: Vashe 5.8 (oz) 1 x Per Week/30 Days Discharge Instructions: Cleanse the wound with Vashe prior to applying a clean dressing using gauze sponges, not tissue or cotton balls. Peri-Wound Care: Sween Lotion (Moisturizing lotion) 1 x Per Week/30 Days Discharge Instructions: Apply moisturizing lotion as directed Topical: Gentamicin 1 x Per Week/30 Days Discharge Instructions: As directed by physician Topical: Mupirocin Ointment 1 x Per Week/30 Days Discharge Instructions: Apply Mupirocin (Bactroban) as instructed Prim Dressing: PolyMem Silver Non-Adhesive Dressing, 4.25x4.25 in 1 x Per Week/30 Days ary Discharge Instructions: Apply to wound bed as instructed Secondary Dressing: Woven Gauze Sponge, Non-Sterile 4x4 in  1 x Per Week/30 Days Discharge Instructions: Apply over primary dressing as directed. Secured With: Transpore Surgical Tape, 2x10 (in/yd) 1 x Per Week/30 Days Discharge Instructions: Secure dressing with tape as directed. Compression Wrap: Kerlix Roll 4.5x3.1 (in/yd) 1 x Per Week/30 Days Discharge Instructions: Apply Kerlix and Coban compression as directed. Lindsey, [name] ([id number]) [id number] Page 5 of 11 Compression Wrap: Coban Self-Adherent Wrap 4x5 (in/yd) 1 x Per Week/30 Days Discharge Instructions: Apply over Kerlix as directed. Compression Wrap: Tubular netting #5 1 x Per Week/30 Days Wound #5 - Lower Leg Wound Laterality: Left, Anterior Cleanser: Soap and Water 1 x Per Week/30 Days Discharge Instructions: May shower and wash wound with dial antibacterial soap and water prior to dressing change. Cleanser: Vashe 5.8 (oz) 1 x Per Week/30 Days Discharge Instructions: Cleanse the wound with Vashe prior to applying a clean dressing using gauze sponges, not tissue or cotton balls. Peri-Wound Care: Sween Lotion (Moisturizing lotion) 1 x Per Week/30 Days Discharge Instructions: Apply moisturizing lotion as directed Topical: Gentamicin 1 x Per Week/30 Days Discharge Instructions: As directed by physician Topical: Mupirocin Ointment 1 x Per Week/30 Days Discharge Instructions: Apply Mupirocin (Bactroban) as instructed Prim Dressing: PolyMem Silver Non-Adhesive Dressing, 4.25x4.25 in 1 x Per Week/30 Days ary Discharge Instructions: Apply to wound bed as instructed Secondary Dressing: Woven Gauze Sponge, Non-Sterile 4x4 in 1 x Per Week/30 Days Discharge Instructions: Apply over primary dressing as directed. Secured With: Transpore Surgical Tape, 2x10 (in/yd) 1 x Per Week/30 Days Discharge Instructions: Secure dressing with tape as directed. Compression Wrap: Kerlix Roll 4.5x3.1 (in/yd) 1 x Per Week/30 Days Discharge Instructions: Apply Kerlix and Coban  compression as directed. Compression Wrap: Coban Self-Adherent Wrap 4x5 (in/yd) 1 x Per Week/30 Days Discharge Instructions: Apply over Kerlix as directed. Compression Wrap: Tubular netting #5 1 x Per Week/30 Days Patient Medications llergies: trazodone, codeine, clarithromycin, diazepam, Sulfa (Sulfonamide Antibiotics), Prilosec A Notifications Medication Indication Start End 05/22/2023 lidocaine DOSE topical 5 % ointment - ointment topical Electronic Signature(s) Signed: 05/22/2023 9:53:37 AM By: Duanne Guess MD FACS Entered By: Duanne Guess on 05/22/2023 06:51:56 -------------------------------------------------------------------------------- Problem List Details Patient Name: Date of Service: [name] 05/22/2023 9:45 A M Medical Record Number: [id number] Patient Account Number: [id number] Date of Birth/Sex: Treating RN: Feb 23, 1928 (89 y.o. F) Primary Care Provider: Seabron Spates Other Clinician: Referring Provider: Treating Provider/Extender: Verner Mould in Treatment: 3 Active Problems ICD-10 Encounter Code Description Active Date MDM [name] ([id number]) [id number] Page 6 of 11 Code  Coban Self-Adherent Wrap 4x5 (in/yd) 1 x Per Week/30 Days Discharge Instructions: Apply over Kerlix as directed. Com pression Wrap: Tubular netting #5 1 x Per Week/30 Days WOUND #5: - Lower Leg Wound Laterality: Left, Anterior Cleanser: Soap and Water 1 x Per Week/30 Days Discharge Instructions: May shower and wash wound with dial antibacterial soap and water prior to dressing change. Cleanser: Vashe 5.8 (oz) 1 x Per Week/30 Days Discharge Instructions: Cleanse the wound with Vashe prior to applying a clean dressing using  gauze sponges, not tissue or cotton balls. Peri-Wound Care: Sween Lotion (Moisturizing lotion) 1 x Per Week/30 Days Discharge Instructions: Apply moisturizing lotion as directed Topical: Gentamicin 1 x Per Week/30 Days Discharge Instructions: As directed by physician Topical: Mupirocin Ointment 1 x Per Week/30 Days Discharge Instructions: Apply Mupirocin (Bactroban) as instructed Prim Dressing: PolyMem Silver Non-Adhesive Dressing, 4.25x4.25 in 1 x Per Week/30 Days ary Discharge Instructions: Apply to wound bed as instructed Secondary Dressing: Woven Gauze Sponge, Non-Sterile 4x4 in 1 x Per Week/30 Days Discharge Instructions: Apply over primary dressing as directed. Secured With: Transpore Surgical T ape, 2x10 (in/yd) 1 x Per Week/30 Days Discharge Instructions: Secure dressing with tape as directed. Com pression Wrap: Kerlix Roll 4.5x3.1 (in/yd) 1 x Per Week/30 Days Discharge Instructions: Apply Kerlix and Coban compression as directed. Com pression Wrap: Coban Self-Adherent Wrap 4x5 (in/yd) 1 x Per Week/30 Days Discharge Instructions: Apply over Kerlix as directed. Com pression Wrap: Tubular netting #5 1 x Per Week/30 Days 05/22/2023: The proximal wound is healed. The distal wound is smaller with a little bit of slough accumulation but good granulation tissue filling in, as [name]. She has a new skin tear on the anterior tibial surface of her left leg, just distal to the tibial tuberosity. The skin flap appears to be adhered and viable with just a small open portion with some slough and eschar present. I used a curette to debride slough off of the right distal lower leg wound and slough and eschar from the new wound on her left lower leg. We will apply topical gentamicin and mupirocin with PolyMem, Kerlix and Coban to both. Follow-up in 1 week. Electronic Signature(s) Signed: 05/22/2023 9:52:31 AM By: Duanne Guess MD FACS Entered By: Duanne Guess on 05/22/2023  06:52:31 -------------------------------------------------------------------------------- HxROS Details Patient Name: Date of Service: [name] 05/22/2023 9:45 A M Medical Record Number: [id number] Patient Account Number: [id number] Date of Birth/Sex: Treating RN: May 30, 1928 (89 y.o. F) Primary Care Provider: Seabron Spates Other Clinician: Referring Provider: Treating Provider/Extender: Verner Mould in Treatment: 3 Information Obtained From Patient Chart Constitutional Symptoms (General Health) Medical History: Past Medical History NotesMarland Kitchen RAENA, PAU ([id number]) [phone].pdf Page 10 of 11 almost bedridden per dgtr - difficult to stand up and sit down safely Eyes Medical History: Positive for: Cataracts - removed X2 Ear/Nose/Mouth/Throat Medical History: Past Medical History Notes: totally deaf on left ; partial hearing with hearing aide on the left Hematologic/Lymphatic Medical History: Positive for: Anemia - s/p heart surgery Cardiovascular Medical History: Positive for: Hypotension; Myocardial Infarction Past Medical History Notes: atherosclerosis Endocrine Medical History: Past Medical History Notes: hypothyroid Integumentary (Skin) Medical History: Negative for: History of Burn Past Medical History Notes: wound of right Lower leg Musculoskeletal Medical History: Negative for: Gout; Rheumatoid Arthritis; Osteoarthritis; Osteomyelitis Oncologic Medical History: Negative for: Received Chemotherapy; Received Radiation Past Medical History Notes: 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 (no chemo or radiation for this) Psychiatric Medical  Description Active Date MDM Diagnosis L97.812 Non-pressure chronic ulcer of other part of right lower leg with fat layer 05/01/2023 No Yes exposed L97.822 Non-pressure chronic ulcer of other part of left lower leg with fat layer exposed9/27/2024 No Yes I87.311 Chronic venous hypertension (idiopathic) with ulcer of right lower extremity 05/01/2023 No Yes J44.9 Chronic obstructive pulmonary disease, unspecified 05/01/2023 No Yes T79.8XXD Other early complications of trauma, subsequent encounter 05/01/2023 No Yes Inactive Problems Resolved Problems Electronic Signature(s) Signed: 05/22/2023 9:48:31 AM By: Duanne Guess MD FACS Entered By: Duanne Guess on 05/22/2023  06:48:31 -------------------------------------------------------------------------------- Progress Note Details Patient Name: Date of Service: [name] 05/22/2023 9:45 A M Medical Record Number: [id number] Patient Account Number: [id number] Date of Birth/Sex: Treating RN: 1927-09-12 (89 y.o. F) Primary Care Provider: Seabron Spates Other Clinician: Referring Provider: Treating Provider/Extender: Verner Mould in Treatment: 3 Subjective Chief Complaint Information obtained from Patient 07/26/2018; patient is here for review of laceration injuries on the left anterior tibia area 05/01/2023; patient presents for right lower extremity wounds in the setting of trauma History of Present Illness (HPI) ADMISSION 07/26/2018 This is an 89 year old woman who lives independently in her own apartment. 3 weeks ago she suffered a fall traumatizing her anterior lower legs against a small table. She developed laceration injuries on the left and right tibial areas. The area on the right has since healed. She has been seeing her primary physician at [facility]. She has had a round of Keflex and more recently a round of Levaquin for periwound cellulitis. According to her daughter she seemed to respond better to the Levaquin. I am not aware of any cultures. They have been using antibiotic cream, T and gauze to dress the wounds. As mentioned an area on elfa the right anterior tibia just below the tibial tuberosity has healed. Past medical history; the patient is not a diabetic nor does she have any history of PAD. She has a history of anemia, depression, gastroesophageal reflux disease, bilateral hearing loss, hyperlipidemia, hypothyroidism, coronary artery artery disease osteoporosis and hypothyroidism she has a history of renal cell carcinoma the exact status of this is uncertain In our clinic her ABIs were very poor at 0.33 and 0.25. Her nurse reported that the waveforms  were difficult to Doppler. Interestingly the patient describes before these wounds she would walk for 45 minutes a day without any suggestion of pain in her legs that would suggest claudication. 08/02/2018 the patient's wounds are smaller both on the left anterior tibia area. We have been using silver collagen under Kerlix and light Coban. After talking the patient sheath did talk about stopping when she walks suggesting some degree of possible claudication she is also complaining of heaviness in her legs. The history here is atypical but I think we should go ahead with formal arterial studies given how poor the ABIs were in our clinic. 08/09/2018; 2 areas on the left anterior tibia. Dimensions are slightly smaller. She still has not heard anything from vascular about arterial studies. We have been using Kerlix and light Coban over collagen 08/16/2018; 2 areas on the left anterior tibia, previous on the right has closed over. We have been using silver collagen under Kerlix and light Coban. Patient has her arterial studies on 08/16/2018, should be able to talk about this next week 08/23/2018; Lindsey, [name] ([id number]) [phone].pdf Page 7 of 11 ARTERIAL studies showed noncompressible on the right but triphasic and biphasic waveforms at the PTA and DP respectively. TBI  Description Active Date MDM Diagnosis L97.812 Non-pressure chronic ulcer of other part of right lower leg with fat layer 05/01/2023 No Yes exposed L97.822 Non-pressure chronic ulcer of other part of left lower leg with fat layer exposed9/27/2024 No Yes I87.311 Chronic venous hypertension (idiopathic) with ulcer of right lower extremity 05/01/2023 No Yes J44.9 Chronic obstructive pulmonary disease, unspecified 05/01/2023 No Yes T79.8XXD Other early complications of trauma, subsequent encounter 05/01/2023 No Yes Inactive Problems Resolved Problems Electronic Signature(s) Signed: 05/22/2023 9:48:31 AM By: Duanne Guess MD FACS Entered By: Duanne Guess on 05/22/2023  06:48:31 -------------------------------------------------------------------------------- Progress Note Details Patient Name: Date of Service: [name] 05/22/2023 9:45 A M Medical Record Number: [id number] Patient Account Number: [id number] Date of Birth/Sex: Treating RN: 1927-09-12 (89 y.o. F) Primary Care Provider: Seabron Spates Other Clinician: Referring Provider: Treating Provider/Extender: Verner Mould in Treatment: 3 Subjective Chief Complaint Information obtained from Patient 07/26/2018; patient is here for review of laceration injuries on the left anterior tibia area 05/01/2023; patient presents for right lower extremity wounds in the setting of trauma History of Present Illness (HPI) ADMISSION 07/26/2018 This is an 89 year old woman who lives independently in her own apartment. 3 weeks ago she suffered a fall traumatizing her anterior lower legs against a small table. She developed laceration injuries on the left and right tibial areas. The area on the right has since healed. She has been seeing her primary physician at [facility]. She has had a round of Keflex and more recently a round of Levaquin for periwound cellulitis. According to her daughter she seemed to respond better to the Levaquin. I am not aware of any cultures. They have been using antibiotic cream, T and gauze to dress the wounds. As mentioned an area on elfa the right anterior tibia just below the tibial tuberosity has healed. Past medical history; the patient is not a diabetic nor does she have any history of PAD. She has a history of anemia, depression, gastroesophageal reflux disease, bilateral hearing loss, hyperlipidemia, hypothyroidism, coronary artery artery disease osteoporosis and hypothyroidism she has a history of renal cell carcinoma the exact status of this is uncertain In our clinic her ABIs were very poor at 0.33 and 0.25. Her nurse reported that the waveforms  were difficult to Doppler. Interestingly the patient describes before these wounds she would walk for 45 minutes a day without any suggestion of pain in her legs that would suggest claudication. 08/02/2018 the patient's wounds are smaller both on the left anterior tibia area. We have been using silver collagen under Kerlix and light Coban. After talking the patient sheath did talk about stopping when she walks suggesting some degree of possible claudication she is also complaining of heaviness in her legs. The history here is atypical but I think we should go ahead with formal arterial studies given how poor the ABIs were in our clinic. 08/09/2018; 2 areas on the left anterior tibia. Dimensions are slightly smaller. She still has not heard anything from vascular about arterial studies. We have been using Kerlix and light Coban over collagen 08/16/2018; 2 areas on the left anterior tibia, previous on the right has closed over. We have been using silver collagen under Kerlix and light Coban. Patient has her arterial studies on 08/16/2018, should be able to talk about this next week 08/23/2018; Lindsey, [name] ([id number]) [phone].pdf Page 7 of 11 ARTERIAL studies showed noncompressible on the right but triphasic and biphasic waveforms at the PTA and DP respectively. TBI  traumatic wounds. She has some degree of chronic venous insufficiency with hemosiderin deposition. She did not have significant arterial studies by results we did in this clinic 05/01/2023 Ms. Ellene Bloodsaw is a 89 year old female with a past medical history of COPD and venous insufficiency that presents to the clinic for a 1 month history of nonhealing wound to the right lower extremity. She reports hitting her shin against a stepstool creating a wound. She has been keeping the area covered. She has been following with her primary care physician for this issue and has been treated with Keflex and levofloxacin for this issue. She currently denies signs of infection. She does not wear compression stockings. 9/13; patient presents for follow-up. We have been using antibiotic ointment with PolyMem under Kerlix/Coban to the right lower extremity. Wounds are smaller. 05/15/2023: Both wounds are substantially smaller. Edema control is good. There is a little bit of eschar on the surface of the proximal wound and some slough on the distal wound. 05/22/2023: The proximal wound is healed. The distal wound is smaller with a little bit of slough accumulation but good granulation tissue filling in, as [name]. She has a new skin tear  on the anterior tibial surface of her left leg, just distal to the tibial tuberosity. The skin flap appears to be adhered and viable with just a small open portion with some slough and eschar present. Electronic Signature(s) Signed: 05/22/2023 9:50:48 AM By: Duanne Guess MD FACS Entered By: Duanne Guess on 05/22/2023 06:50:48 -------------------------------------------------------------------------------- Physical Exam Details Patient Name: Date of Service: [name] 05/22/2023 9:45 A M Medical Record Number: [id number] Patient Account Number: [id number] Date of Birth/Sex: Treating RN: 1928/03/29 (89 y.o. F) Primary Care Provider: Seabron Spates Other Clinician: Referring Provider: Treating Provider/Extender: Verner Mould in Treatment: 3 Constitutional Slightly hypertensive. . . . no acute distress. Respiratory ALEIDA, CRANDELL ([id number]) [id number] Page 4 of 11 Normal work of breathing on room air. Notes 05/22/2023: The proximal wound is healed. The distal wound is smaller with a little bit of slough accumulation but good granulation tissue filling in, as [name]. She has a new skin tear on the anterior tibial surface of her left leg, just distal to the tibial tuberosity. The skin flap appears to be adhered and viable with just a small open portion with some slough and eschar present. Electronic Signature(s) Signed: 05/22/2023 9:51:32 AM By: Duanne Guess MD FACS Entered By: Duanne Guess on 05/22/2023 06:51:32 -------------------------------------------------------------------------------- Physician Orders Details Patient Name: Date of Service: [name] 05/22/2023 9:45 A M Medical Record Number: [id number] Patient Account Number: [id number] Date of Birth/Sex: Treating RN: 26-Dec-1927 (89 y.o. [name] Primary Care Provider: Seabron Spates Other Clinician: Referring  Provider: Treating Provider/Extender: Verner Mould in Treatment: 3 The following information was scribed by: Samuella Bruin The information was scribed for: Duanne Guess Verbal / Phone Orders: No Diagnosis Coding Follow-up Appointments ppointment in 1 week. - Dr. Lady Gary Room 2 Return A Anesthetic (In clinic) Topical Lidocaine 5% applied to wound bed Bathing/ Shower/ Hygiene May shower with protection but do not get wound dressing(s) wet. Protect dressing(s) with water repellant cover (for example, large plastic bag) or a cast cover and may then take shower. - Please cover right leg if showering. Please keep the right leg wraps dry Edema Control - Lymphedema / SCD / Other Right Lower Extremity Elevate legs to the level of the heart or above for 30 minutes daily and/or  Lindsey, [name] ([id number]) [id number] Page 1 of 11 Visit Report for 05/22/2023 Chief Complaint Document Details Patient Name: Date of Service: [name] 05/22/2023 9:45 A M Medical Record Number: [id number] Patient Account Number: [id number] Date of Birth/Sex: Treating RN: 26-Jun-1928 (89 y.o. F) Primary Care Provider: Seabron Spates Other Clinician: Referring Provider: Treating Provider/Extender: Verner Mould in Treatment: 3 Information Obtained from: Patient Chief Complaint 07/26/2018; patient is here for review of laceration injuries on the left anterior tibia area 05/01/2023; patient presents for right lower extremity wounds in the setting of trauma Electronic Signature(s) Signed: 05/22/2023 9:49:23 AM By: Duanne Guess MD FACS Entered By: Duanne Guess on 05/22/2023 06:49:23 -------------------------------------------------------------------------------- Debridement Details Patient Name: Date of Service: Lindsey Needles. 05/22/2023 9:45 A M Medical Record Number: [id number] Patient Account Number: [id number] Date of Birth/Sex: Treating RN: 1928/08/06 (89 y.o. [name] Primary Care Provider: Seabron Spates Other Clinician: Referring Provider: Treating Provider/Extender: Verner Mould in Treatment: 3 Debridement Performed for Assessment: Wound #4 Right,Distal,Anterior Lower Leg Performed By: Physician Duanne Guess, MD The following information was scribed by: Samuella Bruin The information was scribed for: Duanne Guess Debridement Type: Debridement Level of Consciousness (Pre-procedure): Awake and Alert Pre-procedure Verification/Time Out Yes - 09:44 Taken: Start Time: 09:44 Pain Control: Lidocaine 5% topical ointment Percent of Wound Bed Debrided: 100% T Area Debrided (cm): otal 0.78 Tissue and other material debrided: Non-Viable, Slough,  Slough Level: Non-Viable Tissue Debridement Description: Selective/Open Wound Instrument: Curette Bleeding: Minimum Hemostasis Achieved: Pressure Response to Treatment: Procedure was tolerated [name] Level of Consciousness (Post- Awake and Alert procedure): Post Debridement Measurements of Total Wound Length: (cm) 1 Width: (cm) 1 Depth: (cm) 0.1 Volume: (cm) 0.079 Character of Wound/Ulcer Post Debridement: Improved Lindsey, [name] ([id number]) [phone].pdf Page 2 of 11 Post Procedure Diagnosis Same as Pre-procedure Electronic Signature(s) Signed: 05/22/2023 9:53:37 AM By: Duanne Guess MD FACS Signed: 05/22/2023 11:43:56 AM By: Gelene Mink By: Samuella Bruin on 05/22/2023 06:44:43 -------------------------------------------------------------------------------- Debridement Details Patient Name: Date of Service: [name] 05/22/2023 9:45 A M Medical Record Number: [id number] Patient Account Number: [id number] Date of Birth/Sex: Treating RN: 1927-12-29 (89 y.o. [name] Primary Care Provider: Seabron Spates Other Clinician: Referring Provider: Treating Provider/Extender: Verner Mould in Treatment: 3 Debridement Performed for Assessment: Wound #5 Left,Anterior Lower Leg Performed By: Physician Duanne Guess, MD The following information was scribed by: Samuella Bruin The information was scribed for: Duanne Guess Debridement Type: Debridement Level of Consciousness (Pre-procedure): Awake and Alert Pre-procedure Verification/Time Out Yes - 09:44 Taken: Start Time: 09:44 Pain Control: Lidocaine 5% topical ointment Percent of Wound Bed Debrided: 100% T Area Debrided (cm): otal 2.36 Tissue and other material debrided: Non-Viable, Eschar, Slough, Slough Level: Non-Viable Tissue Debridement Description: Selective/Open Wound Instrument: Curette Bleeding:  Minimum Hemostasis Achieved: Pressure Response to Treatment: Procedure was tolerated [name] Level of Consciousness (Post- Awake and Alert procedure): Post Debridement Measurements of Total Wound Length: (cm) 2 Width: (cm) 1.5 Depth: (cm) 0.1 Volume: (cm) 0.236 Character of Wound/Ulcer Post Debridement: Improved Post Procedure Diagnosis Same as Pre-procedure Electronic Signature(s) Signed: 05/22/2023 9:53:37 AM By: Duanne Guess MD FACS Signed: 05/22/2023 11:43:56 AM By: Samuella Bruin Entered By: Samuella Bruin on 05/22/2023 06:45:01 -------------------------------------------------------------------------------- HPI Details Patient Name: Date of Service: [name], Lindsey Robinsons. 05/22/2023 9:45 A M Medical Record Number: [id number] Patient Account Number: [id number] Lindsey, [name] ([id number]) [id number] Page 3 of 11 Date of Birth/Sex: Treating RN:  History: Past Medical History Notes: depression HBO Extended History Items Eyes: Cataracts Immunizations Pneumococcal Vaccine: Received Pneumococcal Vaccination: Yes Received Pneumococcal Vaccination On or After 60th Birthday:  Yes Tetanus Vaccine: Last tetanus shot: 06/08/2016 Implantable Devices None Hospitalization / Surgery History Type of Hospitalization/Surgery renal mass ablation syncope Family and Social History Cancer: Yes - Siblings; Diabetes: No; Heart Disease: Yes - Mother; Hereditary Spherocytosis: No; Hypertension: Yes - Mother; Kidney DiseaseARIEON, [name] ([id number]) [id number] Page 11 of 11 Siblings; Lung Disease: No; Seizures: No; Stroke: No; Thyroid Problems: No; Tuberculosis: No; Former smoker - quit 60 years ago; Marital Status - Widowed; Alcohol Use: Never; Drug Use: No History; Caffeine Use: Daily - coffee; Financial Concerns: No; Food, Clothing or Shelter Needs: No; Support System Lacking: No; Transportation Concerns: No Electronic Signature(s) Signed: 05/22/2023 9:53:37 AM By: Duanne Guess MD FACS Entered By: Duanne Guess on 05/22/2023 06:51:05 -------------------------------------------------------------------------------- SuperBill Details Patient Name: Date of Service: [name] 05/22/2023 Medical Record Number: [id number] Patient Account Number: [id number] Date of Birth/Sex: Treating RN: 1928-05-01 (89 y.o. F) Primary Care Provider: Seabron Spates Other Clinician: Referring Provider: Treating Provider/Extender: Verner Mould in Treatment: 3 Diagnosis Coding ICD-10 Codes Code Description [phone] Non-pressure chronic ulcer of other part of right lower leg with fat layer exposed L97.822 Non-pressure chronic ulcer of other part of left lower leg with fat layer exposed I87.311 Chronic venous hypertension (idiopathic) with ulcer of right lower extremity J44.9 Chronic obstructive pulmonary disease, unspecified T79.8XXD Other early complications of trauma, subsequent encounter Facility Procedures : CPT4 Code: [id number] Description: [phone] - DEBRIDE WOUND 1ST 20 SQ CM OR < ICD-10 Diagnosis  Description L97.812 Non-pressure chronic ulcer of other part of right lower leg with fat layer expos L97.822 Non-pressure chronic ulcer of other part of left lower leg with fat layer  expose Modifier: ed d Quantity: 1 Physician Procedures : CPT4 Code Description Modifier [id number] - WC PHYS LEVEL 4 - EST PT 25 ICD-10 Diagnosis Description L97.812 Non-pressure chronic ulcer of other part of right lower leg with fat layer exposed L97.822 Non-pressure chronic ulcer of other part of left  lower leg with fat layer exposed I87.311 Chronic venous hypertension (idiopathic) with ulcer of right lower extremity J44.9 Chronic obstructive pulmonary disease, unspecified Quantity: 1 : [phone] [id number] - WC PHYS DEBR WO ANESTH 20 SQ CM ICD-10 Diagnosis Description L97.812 Non-pressure chronic ulcer of other part of right lower leg with fat layer exposed L97.822 Non-pressure chronic ulcer of other part of left lower leg with fat layer  exposed Quantity: 1 Electronic Signature(s) Signed: 05/22/2023 9:52:55 AM By: Duanne Guess MD FACS Entered By: Duanne Guess on 05/22/2023 06:52:54  Lindsey, [name] ([id number]) [id number] Page 1 of 11 Visit Report for 05/22/2023 Chief Complaint Document Details Patient Name: Date of Service: [name] 05/22/2023 9:45 A M Medical Record Number: [id number] Patient Account Number: [id number] Date of Birth/Sex: Treating RN: 26-Jun-1928 (89 y.o. F) Primary Care Provider: Seabron Spates Other Clinician: Referring Provider: Treating Provider/Extender: Verner Mould in Treatment: 3 Information Obtained from: Patient Chief Complaint 07/26/2018; patient is here for review of laceration injuries on the left anterior tibia area 05/01/2023; patient presents for right lower extremity wounds in the setting of trauma Electronic Signature(s) Signed: 05/22/2023 9:49:23 AM By: Duanne Guess MD FACS Entered By: Duanne Guess on 05/22/2023 06:49:23 -------------------------------------------------------------------------------- Debridement Details Patient Name: Date of Service: Lindsey Needles. 05/22/2023 9:45 A M Medical Record Number: [id number] Patient Account Number: [id number] Date of Birth/Sex: Treating RN: 1928/08/06 (89 y.o. [name] Primary Care Provider: Seabron Spates Other Clinician: Referring Provider: Treating Provider/Extender: Verner Mould in Treatment: 3 Debridement Performed for Assessment: Wound #4 Right,Distal,Anterior Lower Leg Performed By: Physician Duanne Guess, MD The following information was scribed by: Samuella Bruin The information was scribed for: Duanne Guess Debridement Type: Debridement Level of Consciousness (Pre-procedure): Awake and Alert Pre-procedure Verification/Time Out Yes - 09:44 Taken: Start Time: 09:44 Pain Control: Lidocaine 5% topical ointment Percent of Wound Bed Debrided: 100% T Area Debrided (cm): otal 0.78 Tissue and other material debrided: Non-Viable, Slough,  Slough Level: Non-Viable Tissue Debridement Description: Selective/Open Wound Instrument: Curette Bleeding: Minimum Hemostasis Achieved: Pressure Response to Treatment: Procedure was tolerated [name] Level of Consciousness (Post- Awake and Alert procedure): Post Debridement Measurements of Total Wound Length: (cm) 1 Width: (cm) 1 Depth: (cm) 0.1 Volume: (cm) 0.079 Character of Wound/Ulcer Post Debridement: Improved Lindsey, [name] ([id number]) [phone].pdf Page 2 of 11 Post Procedure Diagnosis Same as Pre-procedure Electronic Signature(s) Signed: 05/22/2023 9:53:37 AM By: Duanne Guess MD FACS Signed: 05/22/2023 11:43:56 AM By: Gelene Mink By: Samuella Bruin on 05/22/2023 06:44:43 -------------------------------------------------------------------------------- Debridement Details Patient Name: Date of Service: [name] 05/22/2023 9:45 A M Medical Record Number: [id number] Patient Account Number: [id number] Date of Birth/Sex: Treating RN: 1927-12-29 (89 y.o. [name] Primary Care Provider: Seabron Spates Other Clinician: Referring Provider: Treating Provider/Extender: Verner Mould in Treatment: 3 Debridement Performed for Assessment: Wound #5 Left,Anterior Lower Leg Performed By: Physician Duanne Guess, MD The following information was scribed by: Samuella Bruin The information was scribed for: Duanne Guess Debridement Type: Debridement Level of Consciousness (Pre-procedure): Awake and Alert Pre-procedure Verification/Time Out Yes - 09:44 Taken: Start Time: 09:44 Pain Control: Lidocaine 5% topical ointment Percent of Wound Bed Debrided: 100% T Area Debrided (cm): otal 2.36 Tissue and other material debrided: Non-Viable, Eschar, Slough, Slough Level: Non-Viable Tissue Debridement Description: Selective/Open Wound Instrument: Curette Bleeding:  Minimum Hemostasis Achieved: Pressure Response to Treatment: Procedure was tolerated [name] Level of Consciousness (Post- Awake and Alert procedure): Post Debridement Measurements of Total Wound Length: (cm) 2 Width: (cm) 1.5 Depth: (cm) 0.1 Volume: (cm) 0.236 Character of Wound/Ulcer Post Debridement: Improved Post Procedure Diagnosis Same as Pre-procedure Electronic Signature(s) Signed: 05/22/2023 9:53:37 AM By: Duanne Guess MD FACS Signed: 05/22/2023 11:43:56 AM By: Samuella Bruin Entered By: Samuella Bruin on 05/22/2023 06:45:01 -------------------------------------------------------------------------------- HPI Details Patient Name: Date of Service: [name], Lindsey Robinsons. 05/22/2023 9:45 A M Medical Record Number: [id number] Patient Account Number: [id number] Lindsey, [name] ([id number]) [id number] Page 3 of 11 Date of Birth/Sex: Treating RN:  when sitting for 3-4 times a day throughout the day. Avoid standing for long periods of time. Exercise regularly - As tolerated Wound Treatment Wound #4 - Lower Leg Wound Laterality: Right, Anterior, Distal Cleanser: Soap and Water 1 x Per Week/30 Days Discharge Instructions: May shower and wash wound with dial antibacterial soap and water prior to dressing change. Cleanser: Vashe 5.8 (oz) 1 x Per Week/30 Days Discharge Instructions: Cleanse the wound with Vashe prior to applying a clean dressing using gauze sponges, not tissue or cotton balls. Peri-Wound Care: Sween Lotion (Moisturizing lotion) 1 x Per Week/30 Days Discharge Instructions: Apply moisturizing lotion as directed Topical: Gentamicin 1 x Per Week/30 Days Discharge Instructions: As directed by physician Topical: Mupirocin Ointment 1 x Per Week/30 Days Discharge Instructions: Apply Mupirocin (Bactroban) as instructed Prim Dressing: PolyMem Silver Non-Adhesive Dressing, 4.25x4.25 in 1 x Per Week/30 Days ary Discharge Instructions: Apply to wound bed as instructed Secondary Dressing: Woven Gauze Sponge, Non-Sterile 4x4 in  1 x Per Week/30 Days Discharge Instructions: Apply over primary dressing as directed. Secured With: Transpore Surgical Tape, 2x10 (in/yd) 1 x Per Week/30 Days Discharge Instructions: Secure dressing with tape as directed. Compression Wrap: Kerlix Roll 4.5x3.1 (in/yd) 1 x Per Week/30 Days Discharge Instructions: Apply Kerlix and Coban compression as directed. Lindsey, [name] ([id number]) [id number] Page 5 of 11 Compression Wrap: Coban Self-Adherent Wrap 4x5 (in/yd) 1 x Per Week/30 Days Discharge Instructions: Apply over Kerlix as directed. Compression Wrap: Tubular netting #5 1 x Per Week/30 Days Wound #5 - Lower Leg Wound Laterality: Left, Anterior Cleanser: Soap and Water 1 x Per Week/30 Days Discharge Instructions: May shower and wash wound with dial antibacterial soap and water prior to dressing change. Cleanser: Vashe 5.8 (oz) 1 x Per Week/30 Days Discharge Instructions: Cleanse the wound with Vashe prior to applying a clean dressing using gauze sponges, not tissue or cotton balls. Peri-Wound Care: Sween Lotion (Moisturizing lotion) 1 x Per Week/30 Days Discharge Instructions: Apply moisturizing lotion as directed Topical: Gentamicin 1 x Per Week/30 Days Discharge Instructions: As directed by physician Topical: Mupirocin Ointment 1 x Per Week/30 Days Discharge Instructions: Apply Mupirocin (Bactroban) as instructed Prim Dressing: PolyMem Silver Non-Adhesive Dressing, 4.25x4.25 in 1 x Per Week/30 Days ary Discharge Instructions: Apply to wound bed as instructed Secondary Dressing: Woven Gauze Sponge, Non-Sterile 4x4 in 1 x Per Week/30 Days Discharge Instructions: Apply over primary dressing as directed. Secured With: Transpore Surgical Tape, 2x10 (in/yd) 1 x Per Week/30 Days Discharge Instructions: Secure dressing with tape as directed. Compression Wrap: Kerlix Roll 4.5x3.1 (in/yd) 1 x Per Week/30 Days Discharge Instructions: Apply Kerlix and Coban  compression as directed. Compression Wrap: Coban Self-Adherent Wrap 4x5 (in/yd) 1 x Per Week/30 Days Discharge Instructions: Apply over Kerlix as directed. Compression Wrap: Tubular netting #5 1 x Per Week/30 Days Patient Medications llergies: trazodone, codeine, clarithromycin, diazepam, Sulfa (Sulfonamide Antibiotics), Prilosec A Notifications Medication Indication Start End 05/22/2023 lidocaine DOSE topical 5 % ointment - ointment topical Electronic Signature(s) Signed: 05/22/2023 9:53:37 AM By: Duanne Guess MD FACS Entered By: Duanne Guess on 05/22/2023 06:51:56 -------------------------------------------------------------------------------- Problem List Details Patient Name: Date of Service: [name] 05/22/2023 9:45 A M Medical Record Number: [id number] Patient Account Number: [id number] Date of Birth/Sex: Treating RN: Feb 23, 1928 (89 y.o. F) Primary Care Provider: Seabron Spates Other Clinician: Referring Provider: Treating Provider/Extender: Verner Mould in Treatment: 3 Active Problems ICD-10 Encounter Code Description Active Date MDM [name] ([id number]) [id number] Page 6 of 11 Code  Lindsey, [name] ([id number]) [id number] Page 1 of 11 Visit Report for 05/22/2023 Chief Complaint Document Details Patient Name: Date of Service: [name] 05/22/2023 9:45 A M Medical Record Number: [id number] Patient Account Number: [id number] Date of Birth/Sex: Treating RN: 26-Jun-1928 (89 y.o. F) Primary Care Provider: Seabron Spates Other Clinician: Referring Provider: Treating Provider/Extender: Verner Mould in Treatment: 3 Information Obtained from: Patient Chief Complaint 07/26/2018; patient is here for review of laceration injuries on the left anterior tibia area 05/01/2023; patient presents for right lower extremity wounds in the setting of trauma Electronic Signature(s) Signed: 05/22/2023 9:49:23 AM By: Duanne Guess MD FACS Entered By: Duanne Guess on 05/22/2023 06:49:23 -------------------------------------------------------------------------------- Debridement Details Patient Name: Date of Service: Lindsey Needles. 05/22/2023 9:45 A M Medical Record Number: [id number] Patient Account Number: [id number] Date of Birth/Sex: Treating RN: 1928/08/06 (89 y.o. [name] Primary Care Provider: Seabron Spates Other Clinician: Referring Provider: Treating Provider/Extender: Verner Mould in Treatment: 3 Debridement Performed for Assessment: Wound #4 Right,Distal,Anterior Lower Leg Performed By: Physician Duanne Guess, MD The following information was scribed by: Samuella Bruin The information was scribed for: Duanne Guess Debridement Type: Debridement Level of Consciousness (Pre-procedure): Awake and Alert Pre-procedure Verification/Time Out Yes - 09:44 Taken: Start Time: 09:44 Pain Control: Lidocaine 5% topical ointment Percent of Wound Bed Debrided: 100% T Area Debrided (cm): otal 0.78 Tissue and other material debrided: Non-Viable, Slough,  Slough Level: Non-Viable Tissue Debridement Description: Selective/Open Wound Instrument: Curette Bleeding: Minimum Hemostasis Achieved: Pressure Response to Treatment: Procedure was tolerated [name] Level of Consciousness (Post- Awake and Alert procedure): Post Debridement Measurements of Total Wound Length: (cm) 1 Width: (cm) 1 Depth: (cm) 0.1 Volume: (cm) 0.079 Character of Wound/Ulcer Post Debridement: Improved Lindsey, [name] ([id number]) [phone].pdf Page 2 of 11 Post Procedure Diagnosis Same as Pre-procedure Electronic Signature(s) Signed: 05/22/2023 9:53:37 AM By: Duanne Guess MD FACS Signed: 05/22/2023 11:43:56 AM By: Gelene Mink By: Samuella Bruin on 05/22/2023 06:44:43 -------------------------------------------------------------------------------- Debridement Details Patient Name: Date of Service: [name] 05/22/2023 9:45 A M Medical Record Number: [id number] Patient Account Number: [id number] Date of Birth/Sex: Treating RN: 1927-12-29 (89 y.o. [name] Primary Care Provider: Seabron Spates Other Clinician: Referring Provider: Treating Provider/Extender: Verner Mould in Treatment: 3 Debridement Performed for Assessment: Wound #5 Left,Anterior Lower Leg Performed By: Physician Duanne Guess, MD The following information was scribed by: Samuella Bruin The information was scribed for: Duanne Guess Debridement Type: Debridement Level of Consciousness (Pre-procedure): Awake and Alert Pre-procedure Verification/Time Out Yes - 09:44 Taken: Start Time: 09:44 Pain Control: Lidocaine 5% topical ointment Percent of Wound Bed Debrided: 100% T Area Debrided (cm): otal 2.36 Tissue and other material debrided: Non-Viable, Eschar, Slough, Slough Level: Non-Viable Tissue Debridement Description: Selective/Open Wound Instrument: Curette Bleeding:  Minimum Hemostasis Achieved: Pressure Response to Treatment: Procedure was tolerated [name] Level of Consciousness (Post- Awake and Alert procedure): Post Debridement Measurements of Total Wound Length: (cm) 2 Width: (cm) 1.5 Depth: (cm) 0.1 Volume: (cm) 0.236 Character of Wound/Ulcer Post Debridement: Improved Post Procedure Diagnosis Same as Pre-procedure Electronic Signature(s) Signed: 05/22/2023 9:53:37 AM By: Duanne Guess MD FACS Signed: 05/22/2023 11:43:56 AM By: Samuella Bruin Entered By: Samuella Bruin on 05/22/2023 06:45:01 -------------------------------------------------------------------------------- HPI Details Patient Name: Date of Service: [name], Lindsey Robinsons. 05/22/2023 9:45 A M Medical Record Number: [id number] Patient Account Number: [id number] Lindsey, [name] ([id number]) [id number] Page 3 of 11 Date of Birth/Sex: Treating RN:

## 2023-05-22 NOTE — Progress Notes (Signed)
Exposed: Yes Necrotic Quality: Adherent Slough Tendon Exposed: No Muscle Exposed: No Joint Exposed: No Bone Exposed: No Periwound Skin Texture Texture Color No Abnormalities Noted: No No Abnormalities Noted: No Callus: No Atrophie Blanche: No Crepitus: No Cyanosis: No Excoriation: No Ecchymosis: No Induration: No Erythema: No Rash: No Hemosiderin Staining: Yes Scarring: No Mottled: No Pallor: No [address] AVAROSE, MERVINE ([id number]) [phone].pdf Page 8 of 10 Rubor: No No Abnormalities Noted: Yes Temperature / Pain Temperature: No Abnormality Treatment Notes Wound #4 (Lower Leg) Wound Laterality: Right, Anterior, Distal Cleanser Soap and Water Discharge Instruction: May shower and wash wound with dial antibacterial soap and water prior to dressing change. Vashe 5.8 (oz) Discharge Instruction: Cleanse the wound with Vashe prior to applying a clean dressing using gauze sponges, not tissue or cotton balls. Peri-Wound Care Sween Lotion (Moisturizing lotion) Discharge Instruction: Apply moisturizing lotion as directed Topical Gentamicin Discharge Instruction: As directed by physician Mupirocin Ointment Discharge Instruction: Apply Mupirocin (Bactroban) as instructed Primary Dressing PolyMem Silver  Non-Adhesive Dressing, 4.25x4.25 in Discharge Instruction: Apply to wound bed as instructed Secondary Dressing Woven Gauze Sponge, Non-Sterile 4x4 in Discharge Instruction: Apply over primary dressing as directed. Secured With Transpore Surgical Tape, 2x10 (in/yd) Discharge Instruction: Secure dressing with tape as directed. Compression Wrap Kerlix Roll 4.5x3.1 (in/yd) Discharge Instruction: Apply Kerlix and Coban compression as directed. Coban Self-Adherent Wrap 4x5 (in/yd) Discharge Instruction: Apply over Kerlix as directed. Tubular netting #5 Compression Stockings Add-Ons Electronic Signature(s) Signed: 05/22/2023 11:43:56 AM By: Samuella Bruin Entered By: Samuella Bruin on 05/22/2023 06:39:14 -------------------------------------------------------------------------------- Wound Assessment Details Patient Name: Date of Service: [name] 05/22/2023 9:45 A M Medical Record Number: [id number] Patient Account Number: [id number] Date of Birth/Sex: Treating RN: Nov 21, 1927 (89 y.o. F) Primary Care Leavy Heatherly: Seabron Spates Other Clinician: Referring Elane Peabody: Treating Azlaan Isidore/Extender: Verner Mould in Treatment: 3 Wound Status Wound Number: 5 Primary Etiology: Skin Tear Wound Location: Left, Anterior Lower Leg Wound Status: Open Wounding Event: Trauma Comorbid History: Cataracts, Anemia, Hypotension, Myocardial Infarction Date Acquired: 05/15/2023 REY, FORS ([id number]) [phone].pdf Page 9 of 10 Weeks Of Treatment: 0 Clustered Wound: No Photos Wound Measurements Length: (cm) 2 Width: (cm) 1.5 Depth: (cm) 0.1 Area: (cm) 2.356 Volume: (cm) 0.236 % Reduction in Area: % Reduction in Volume: Epithelialization: Small (1-33%) Tunneling: No Undermining: No Wound Description Classification: Partial Thickness Wound Margin: Distinct, outline attached Exudate Amount: Medium Exudate Type:  Serosanguineous Exudate Color: red, brown Foul Odor After Cleansing: No Slough/Fibrino Yes Wound Bed Granulation Amount: Large (67-100%) Exposed Structure Granulation Quality: Red Fascia Exposed: No Necrotic Amount: Small (1-33%) Fat Layer (Subcutaneous Tissue) Exposed: Yes Necrotic Quality: Adherent Slough Tendon Exposed: No Muscle Exposed: No Joint Exposed: No Bone Exposed: No Periwound Skin Texture Texture Color No Abnormalities Noted: Yes No Abnormalities Noted: Yes Moisture Temperature / Pain No Abnormalities Noted: Yes Temperature: No Abnormality Electronic Signature(s) Signed: 05/22/2023 11:43:56 AM By: Samuella Bruin Entered By: Samuella Bruin on 05/22/2023 06:39:40 -------------------------------------------------------------------------------- Vitals Details Patient Name: Date of Service: [name]. 05/22/2023 9:45 A M Medical Record Number: [id number] Patient Account Number: [id number] Date of Birth/Sex: Treating RN: [birth year] (89 y.o. [name] Primary Care Avonte Sensabaugh: Seabron Spates Other Clinician: Referring Tyreka Henneke: Treating Quinlan Vollmer/Extender: Verner Mould in Treatment: 3 Vital Signs Time Taken: 09:31 Temperature (F): 98.1 Height (in): 62 Pulse (bpm): 90 Weight (lbs): 85 Respiratory Rate (breaths/min): 16 Body Mass Index (BMI): 15.5 Blood Pressure (mmHg): 146/56 ITZABELLA, SORRELS ([id number]) [phone].pdf Page 10 of 10  OVIDA, DELAGARZA ([id number]) [id number] Page 1 of 10 Visit Report for 05/22/2023 Arrival Information Details Patient Name: Date of Service: [name] 05/22/2023 9:45 A M Medical Record Number: [id number] Patient Account Number: [id number] Date of Birth/Sex: Treating RN: December 13, 1927 (89 y.o. [name] Primary Care Quamere Mussell: Seabron Spates Other Clinician: Referring Denisia Harpole: Treating Kaeson Kleinert/Extender: Verner Mould in Treatment: 3 Visit Information History Since Last Visit Added or deleted any medications: No Patient Arrived: Wheel Chair Any new allergies or adverse reactions: No Arrival Time: 09:28 Had a fall or experienced change in No Accompanied By: daughter activities of daily living that may affect Transfer Assistance: Manual risk of falls: Patient Identification Verified: Yes Signs or symptoms of abuse/neglect since last visito No Secondary Verification Process Completed: Yes Hospitalized since last visit: No Patient Has Alerts: Yes Implantable device outside of the clinic excluding No Patient Alerts: R ABI [place] cellular tissue based products placed in the center since last visit: Has Dressing in Place as Prescribed: Yes Has Compression in Place as Prescribed: Yes Pain Present Now: No Electronic Signature(s) Signed: 05/22/2023 11:43:56 AM By: Samuella Bruin Entered By: Samuella Bruin on 05/22/2023 06:30:55 -------------------------------------------------------------------------------- Encounter Discharge Information Details Patient Name: Date of Service: [name] 05/22/2023 9:45 A M Medical Record Number: [id number] Patient Account Number: [id number] Date of Birth/Sex: Treating RN: 1927/09/10 (89 y.o. [name] Primary Care Regene Mccarthy: Seabron Spates Other Clinician: Referring Mya Suell: Treating Takuma Cifelli/Extender: Verner Mould in  Treatment: 3 Encounter Discharge Information Items Post Procedure Vitals Discharge Condition: Stable Temperature (F): 98.1 Ambulatory Status: Wheelchair Pulse (bpm): 90 Discharge Destination: Home Respiratory Rate (breaths/min): 16 Transportation: Private Auto Blood Pressure (mmHg): 146/56 Accompanied By: daughter Schedule Follow-up Appointment: Yes Clinical Summary of Care: Patient Declined Electronic Signature(s) Signed: 05/22/2023 11:43:56 AM By: Samuella Bruin Entered By: Samuella Bruin on 05/22/2023 06:59:47 [name] ([id number]) [id number] Page 2 of 10 -------------------------------------------------------------------------------- Lower Extremity Assessment Details Patient Name: Date of Service: [name] 05/22/2023 9:45 A M Medical Record Number: [id number] Patient Account Number: [id number] Date of Birth/Sex: Treating RN: 01-29-1928 (89 y.o. [name] Primary Care Neftali Thurow: Seabron Spates Other Clinician: Referring Novak Stgermaine: Treating Carleton [place]/Extender: Verner Mould in Treatment: 3 Edema Assessment Assessed: [Left: No] [Right: No] [Left: Edema] [Right: :] Calf Left: Right: Point of Measurement: 31 cm From Medial Instep 28.5 cm 26.8 cm Ankle Left: Right: Point of Measurement: 8 cm From Medial Instep 21 cm 20.5 cm Vascular Assessment Pulses: Dorsalis Pedis Palpable: [Left:Yes] [Right:Yes] Extremity colors, hair growth, and conditions: Extremity Color: [Right:Pale] Hair Growth on Extremity: [Right:No] Temperature of Extremity: [Right:Warm] Capillary Refill: [Right:> 3 seconds] Dependent Rubor: [Right:No No] Electronic Signature(s) Signed: 05/22/2023 11:43:56 AM By: Samuella Bruin Entered By: Samuella Bruin on 05/22/2023 06:35:39 -------------------------------------------------------------------------------- Multi Wound Chart Details Patient Name: Date of  Service: [name] 05/22/2023 9:45 A M Medical Record Number: [id number] Patient Account Number: [id number] Date of Birth/Sex: Treating RN: 1927-10-15 (89 y.o. F) Primary Care Lauralynn Loeb: Seabron Spates Other Clinician: Referring Maryjane Benedict: Treating Cyana Shook/Extender: Verner Mould in Treatment: 3 Vital Signs Height(in): 62 Pulse(bpm): 90 Weight(lbs): 85 Blood Pressure(mmHg): 146/56 Body Mass Index(BMI): 15.5 Temperature(F): 98.1 Respiratory Rate(breaths/min): 16 [3:Photos:] [[id number] Page 3 of 10] Right, Proximal, Anterior Lower Leg Right, Distal, Anterior Lower Leg Left, Anterior Lower Leg Wound Location: Hematoma Hematoma Trauma Wounding Event: Trauma, Other Trauma, Other Skin T ear Primary Etiology: Cataracts, Anemia, Hypotension, Cataracts, Anemia, Hypotension, Cataracts,  OVIDA, DELAGARZA ([id number]) [id number] Page 1 of 10 Visit Report for 05/22/2023 Arrival Information Details Patient Name: Date of Service: [name] 05/22/2023 9:45 A M Medical Record Number: [id number] Patient Account Number: [id number] Date of Birth/Sex: Treating RN: December 13, 1927 (89 y.o. [name] Primary Care Quamere Mussell: Seabron Spates Other Clinician: Referring Denisia Harpole: Treating Kaeson Kleinert/Extender: Verner Mould in Treatment: 3 Visit Information History Since Last Visit Added or deleted any medications: No Patient Arrived: Wheel Chair Any new allergies or adverse reactions: No Arrival Time: 09:28 Had a fall or experienced change in No Accompanied By: daughter activities of daily living that may affect Transfer Assistance: Manual risk of falls: Patient Identification Verified: Yes Signs or symptoms of abuse/neglect since last visito No Secondary Verification Process Completed: Yes Hospitalized since last visit: No Patient Has Alerts: Yes Implantable device outside of the clinic excluding No Patient Alerts: R ABI [place] cellular tissue based products placed in the center since last visit: Has Dressing in Place as Prescribed: Yes Has Compression in Place as Prescribed: Yes Pain Present Now: No Electronic Signature(s) Signed: 05/22/2023 11:43:56 AM By: Samuella Bruin Entered By: Samuella Bruin on 05/22/2023 06:30:55 -------------------------------------------------------------------------------- Encounter Discharge Information Details Patient Name: Date of Service: [name] 05/22/2023 9:45 A M Medical Record Number: [id number] Patient Account Number: [id number] Date of Birth/Sex: Treating RN: 1927/09/10 (89 y.o. [name] Primary Care Regene Mccarthy: Seabron Spates Other Clinician: Referring Mya Suell: Treating Takuma Cifelli/Extender: Verner Mould in  Treatment: 3 Encounter Discharge Information Items Post Procedure Vitals Discharge Condition: Stable Temperature (F): 98.1 Ambulatory Status: Wheelchair Pulse (bpm): 90 Discharge Destination: Home Respiratory Rate (breaths/min): 16 Transportation: Private Auto Blood Pressure (mmHg): 146/56 Accompanied By: daughter Schedule Follow-up Appointment: Yes Clinical Summary of Care: Patient Declined Electronic Signature(s) Signed: 05/22/2023 11:43:56 AM By: Samuella Bruin Entered By: Samuella Bruin on 05/22/2023 06:59:47 [name] ([id number]) [id number] Page 2 of 10 -------------------------------------------------------------------------------- Lower Extremity Assessment Details Patient Name: Date of Service: [name] 05/22/2023 9:45 A M Medical Record Number: [id number] Patient Account Number: [id number] Date of Birth/Sex: Treating RN: 01-29-1928 (89 y.o. [name] Primary Care Neftali Thurow: Seabron Spates Other Clinician: Referring Novak Stgermaine: Treating Carleton [place]/Extender: Verner Mould in Treatment: 3 Edema Assessment Assessed: [Left: No] [Right: No] [Left: Edema] [Right: :] Calf Left: Right: Point of Measurement: 31 cm From Medial Instep 28.5 cm 26.8 cm Ankle Left: Right: Point of Measurement: 8 cm From Medial Instep 21 cm 20.5 cm Vascular Assessment Pulses: Dorsalis Pedis Palpable: [Left:Yes] [Right:Yes] Extremity colors, hair growth, and conditions: Extremity Color: [Right:Pale] Hair Growth on Extremity: [Right:No] Temperature of Extremity: [Right:Warm] Capillary Refill: [Right:> 3 seconds] Dependent Rubor: [Right:No No] Electronic Signature(s) Signed: 05/22/2023 11:43:56 AM By: Samuella Bruin Entered By: Samuella Bruin on 05/22/2023 06:35:39 -------------------------------------------------------------------------------- Multi Wound Chart Details Patient Name: Date of  Service: [name] 05/22/2023 9:45 A M Medical Record Number: [id number] Patient Account Number: [id number] Date of Birth/Sex: Treating RN: 1927-10-15 (89 y.o. F) Primary Care Lauralynn Loeb: Seabron Spates Other Clinician: Referring Maryjane Benedict: Treating Cyana Shook/Extender: Verner Mould in Treatment: 3 Vital Signs Height(in): 62 Pulse(bpm): 90 Weight(lbs): 85 Blood Pressure(mmHg): 146/56 Body Mass Index(BMI): 15.5 Temperature(F): 98.1 Respiratory Rate(breaths/min): 16 [3:Photos:] [[id number].pdf Page 3 of 10] Right, Proximal, Anterior Lower Leg Right, Distal, Anterior Lower Leg Left, Anterior Lower Leg Wound Location: Hematoma Hematoma Trauma Wounding Event: Trauma, Other Trauma, Other Skin T ear Primary Etiology: Cataracts, Anemia, Hypotension, Cataracts, Anemia, Hypotension, Cataracts,  Has Paino No Site Locations Rate the pain. TIARE, ROHLMAN ([id number]) [id number] Page 5 of 10 Rate the pain. Current Pain Level: 0 Pain Management and Medication Current Pain Management: Electronic Signature(s) Signed: 05/22/2023 11:43:56 AM By: Samuella Bruin Entered By:  Samuella Bruin on 05/22/2023 06:31:51 -------------------------------------------------------------------------------- Patient/Caregiver Education Details Patient Name: Date of Service: [name] 9/27/2024andnbsp9:45 A M Medical Record Number: [id number] Patient Account Number: [id number] Date of Birth/Gender: Treating RN: 11/05/1927 (89 y.o. [name] Primary Care Physician: Seabron Spates Other Clinician: Referring Physician: Treating Physician/Extender: Verner Mould in Treatment: 3 Education Assessment Education Provided To: Patient Education Topics Provided Wound/Skin Impairment: Methods: Explain/Verbal Responses: Reinforcements needed, State content correctly Electronic Signature(s) Signed: 05/22/2023 11:43:56 AM By: Samuella Bruin Entered By: Samuella Bruin on 05/22/2023 06:59:09 -------------------------------------------------------------------------------- Wound Assessment Details Patient Name: Date of Service: [name] 05/22/2023 9:45 A M Medical Record Number: [id number] Patient Account Number: [id number] Date of Birth/Sex: Treating RN: 1928/06/04 (89 y.o. [name] Primary Care Marquasia Schmieder: Seabron Spates Other Clinician: Referring Syaire Saber: Treating Tishawna Larouche/Extender: Tiffeny, Minchew ([id number]) [id number] Page 6 of 10 Weeks in Treatment: 3 Wound Status Wound Number: 3 Primary Etiology: Trauma, Other Wound Location: Right, Proximal, Anterior Lower Leg Wound Status: Healed - Epithelialized Wounding Event: Hematoma Comorbid History: Cataracts, Anemia, Hypotension, Myocardial Infarction Date Acquired: 04/05/2023 Weeks Of Treatment: 3 Clustered Wound: No Photos Wound Measurements Length: (cm) Width: (cm) Depth: (cm) Area: (cm) Volume: (cm) 0 % Reduction in Area: 100% 0 % Reduction in Volume: 100% 0  Epithelialization: Large (67-100%) 0 Tunneling: No 0 Undermining: No Wound Description Classification: Full Thickness Without Exposed Suppor Wound Margin: Distinct, outline attached Exudate Amount: None Present t Structures Foul Odor After Cleansing: No Slough/Fibrino No Wound Bed Granulation Amount: None Present (0%) Exposed Structure Necrotic Amount: None Present (0%) Fascia Exposed: No Fat Layer (Subcutaneous Tissue) Exposed: No Tendon Exposed: No Muscle Exposed: No Joint Exposed: No Bone Exposed: No Periwound Skin Texture Texture Color No Abnormalities Noted: Yes No Abnormalities Noted: Yes Moisture Temperature / Pain No Abnormalities Noted: Yes Temperature: No Abnormality Treatment Notes Wound #3 (Lower Leg) Wound Laterality: Right, Anterior, Proximal Cleanser Peri-Wound Care Topical Primary Dressing Secondary Dressing Secured With Compression Wrap Compression Stockings Add-Ons Electronic Signature(s) Signed: 05/22/2023 11:43:56 AM By: Gordy Clement ([id number]) [id number] Page 7 of 10 Entered By: Samuella Bruin on 05/22/2023 06:44:06 -------------------------------------------------------------------------------- Wound Assessment Details Patient Name: Date of Service: [name] 05/22/2023 9:45 A M Medical Record Number: [id number] Patient Account Number: [id number] Date of Birth/Sex: Treating RN: 1928-08-10 (89 y.o. F) Primary Care Affan Callow: Seabron Spates Other Clinician: Referring Shashana Fullington: Treating Keilana Morlock/Extender: Verner Mould in Treatment: 3 Wound Status Wound Number: 4 Primary Etiology: Trauma, Other Wound Location: Right, Distal, Anterior Lower Leg Wound Status: Open Wounding Event: Hematoma Comorbid History: Cataracts, Anemia, Hypotension, Myocardial Infarction Date Acquired: 04/05/2023 Weeks Of Treatment: 3 Clustered Wound: No Photos Wound  Measurements Length: (cm) 1 Width: (cm) 1 Depth: (cm) 0.1 Area: (cm) 0.785 Volume: (cm) 0.079 % Reduction in Area: 77.3% % Reduction in Volume: 77.2% Epithelialization: Medium (34-66%) Tunneling: No Undermining: No Wound Description Classification: Full Thickness Without Exposed Suppor Wound Margin: Fibrotic scar, thickened scar Exudate Amount: Medium Exudate Type: Serosanguineous Exudate Color: red, brown t Structures Foul Odor After Cleansing: No Slough/Fibrino Yes Wound Bed Granulation Amount: Large (67-100%) Exposed Structure Granulation Quality: Red Fascia Exposed: No Necrotic Amount: Small (1-33%) Fat Layer (Subcutaneous Tissue)  Anemia, Hypotension, Comorbid History: Myocardial Infarction Myocardial Infarction Myocardial Infarction 04/05/2023 04/05/2023 05/15/2023 Date Acquired: 3 3 0 Weeks of Treatment: Healed - Epithelialized Open Open Wound Status: No No No Wound Recurrence: 0x0x0 1x1x0.1 2x1.5x0.1 Measurements L x W x D (cm) 0 0.785 2.356 A (cm) : rea 0 0.079 0.236 Volume (cm) : 100.00% 77.30% N/A % Reduction in A rea: 100.00% 77.20% N/A % Reduction in Volume: Full Thickness Without Exposed Full Thickness Without Exposed Partial Thickness Classification: Support Structures Support Structures None Present Medium Medium Exudate A mount: N/A Serosanguineous Serosanguineous Exudate Type: N/A red, brown red, brown Exudate Color: Distinct, outline attached Fibrotic scar, thickened scar Distinct, outline attached Wound Margin: None Present (0%) Large (67-100%) Large (67-100%) Granulation A mount: N/A Red Red Granulation Quality: None Present (0%) Small (1-33%) Small (1-33%) Necrotic A mount: Fascia: No Fat Layer (Subcutaneous Tissue): Yes Fat  Layer (Subcutaneous Tissue): Yes Exposed Structures: Fat Layer (Subcutaneous Tissue): No Fascia: No Fascia: No Tendon: No Tendon: No Tendon: No Muscle: No Muscle: No Muscle: No Joint: No Joint: No Joint: No Bone: No Bone: No Bone: No Large (67-100%) Medium (34-66%) Small (1-33%) Epithelialization: N/A Debridement - Selective/Open Wound Debridement - Selective/Open Wound Debridement: Pre-procedure Verification/Time Out N/A 09:44 09:44 Taken: N/A Lidocaine 5% topical ointment Lidocaine 5% topical ointment Pain Control: N/A Ambulance person, Slough Tissue Debrided: N/A Non-Viable Tissue Non-Viable Tissue Level: N/A 0.78 2.36 Debridement A (sq cm): rea N/A Curette Curette Instrument: N/A Minimum Minimum Bleeding: N/A Pressure Pressure Hemostasis A chieved: N/A Procedure was tolerated well Procedure was tolerated well Debridement Treatment Response: N/A 1x1x0.1 2x1.5x0.1 Post Debridement Measurements L x W x D (cm) N/A 0.079 0.236 Post Debridement Volume: (cm) Excoriation: No Excoriation: No No Abnormalities Noted Periwound Skin Texture: Induration: No Induration: No Callus: No Callus: No Crepitus: No Crepitus: No Rash: No Rash: No Scarring: No Scarring: No Maceration: No Maceration: No No Abnormalities Noted Periwound Skin Moisture: Dry/Scaly: No Dry/Scaly: No Atrophie Blanche: No Hemosiderin Staining: Yes No Abnormalities Noted Periwound Skin Color: Cyanosis: No Atrophie Blanche: No Ecchymosis: No Cyanosis: No Erythema: No Ecchymosis: No Hemosiderin Staining: No Erythema: No Mottled: No Mottled: No Pallor: No Pallor: No Rubor: No Rubor: No No Abnormality No Abnormality No Abnormality Temperature: N/A Debridement Debridement Procedures Performed: Treatment Notes Electronic Signature(s) Signed: 05/22/2023 9:48:55 AM By: Duanne Guess MD FACS Entered By: Duanne Guess on 05/22/2023 06:48:55 [name] ([id number])  [id number] Page 4 of 10 -------------------------------------------------------------------------------- Multi-Disciplinary Care Plan Details Patient Name: Date of Service: [name] 05/22/2023 9:45 A M Medical Record Number: [id number] Patient Account Number: [id number] Date of Birth/Sex: Treating RN: [birth year] (89 y.o. [name] Primary Care Gracin Mcpartland: Seabron Spates Other Clinician: Referring Cheryn Lundquist: Treating Bartley Vuolo/Extender: Verner Mould in Treatment: 3 Active Inactive Wound/Skin Impairment Nursing Diagnoses: Impaired tissue integrity Goals: Patient/caregiver will verbalize understanding of skin care regimen Date Initiated: 05/01/2023 Target Resolution Date: 07/25/2023 Goal Status: Active Interventions: Assess ulceration(s) every visit Treatment Activities: Skin care regimen initiated : 05/01/2023 Notes: Electronic Signature(s) Signed: 05/22/2023 11:43:56 AM By: Samuella Bruin Entered By: Samuella Bruin on 05/22/2023 06:58:56 -------------------------------------------------------------------------------- Pain Assessment Details Patient Name: Date of Service: [name] 05/22/2023 9:45 A M Medical Record Number: [id number] Patient Account Number: [id number] Date of Birth/Sex: Treating RN: 1928/02/21 (89 y.o. [name] Primary Care Jerame Hedding: Seabron Spates Other Clinician: Referring Giah Fickett: Treating Stellarose Cerny/Extender: Verner Mould in Treatment: 3 Active Problems Location of Pain Severity and Description of Pain Patient

## 2023-05-29 ENCOUNTER — Encounter (HOSPITAL_BASED_OUTPATIENT_CLINIC_OR_DEPARTMENT_OTHER): Payer: Medicare Other | Attending: General Surgery | Admitting: General Surgery

## 2023-05-29 DIAGNOSIS — I872 Venous insufficiency (chronic) (peripheral): Secondary | ICD-10-CM | POA: Diagnosis not present

## 2023-05-29 DIAGNOSIS — I87311 Chronic venous hypertension (idiopathic) with ulcer of right lower extremity: Secondary | ICD-10-CM | POA: Insufficient documentation

## 2023-05-29 DIAGNOSIS — J449 Chronic obstructive pulmonary disease, unspecified: Secondary | ICD-10-CM | POA: Insufficient documentation

## 2023-05-29 DIAGNOSIS — E039 Hypothyroidism, unspecified: Secondary | ICD-10-CM | POA: Diagnosis not present

## 2023-05-29 DIAGNOSIS — I10 Essential (primary) hypertension: Secondary | ICD-10-CM | POA: Insufficient documentation

## 2023-05-29 DIAGNOSIS — E785 Hyperlipidemia, unspecified: Secondary | ICD-10-CM | POA: Diagnosis not present

## 2023-05-29 DIAGNOSIS — D649 Anemia, unspecified: Secondary | ICD-10-CM | POA: Insufficient documentation

## 2023-05-29 DIAGNOSIS — S81801A Unspecified open wound, right lower leg, initial encounter: Secondary | ICD-10-CM | POA: Diagnosis not present

## 2023-05-29 DIAGNOSIS — L97812 Non-pressure chronic ulcer of other part of right lower leg with fat layer exposed: Secondary | ICD-10-CM | POA: Insufficient documentation

## 2023-05-29 DIAGNOSIS — Z87891 Personal history of nicotine dependence: Secondary | ICD-10-CM | POA: Insufficient documentation

## 2023-05-29 DIAGNOSIS — I779 Disorder of arteries and arterioles, unspecified: Secondary | ICD-10-CM | POA: Insufficient documentation

## 2023-05-29 DIAGNOSIS — L97822 Non-pressure chronic ulcer of other part of left lower leg with fat layer exposed: Secondary | ICD-10-CM | POA: Diagnosis not present

## 2023-05-29 NOTE — Progress Notes (Signed)
JERAE, IZARD ([id number]) [id number] Page 1 of 9 Visit Report for 05/29/2023 Arrival Information Details Patient Name: Date of Service: [name] 05/29/2023 8:15 A M Medical Record Number: [id number] Patient Account Number: [id number] Date of Birth/Sex: Treating RN: 07-04-1928 (89 y.o. [name] Primary Care Talvin Christianson: Seabron Spates Other Clinician: Referring Fawna Cranmer: Treating Roxine Whittinghill/Extender: Verner Mould in Treatment: 4 Visit Information History Since Last Visit Added or deleted any medications: No Patient Arrived: Wheel Chair Any new allergies or adverse reactions: No Arrival Time: 08:02 Had a fall or experienced change in No Accompanied By: daughter activities of daily living that may affect Transfer Assistance: Manual risk of falls: Patient Identification Verified: Yes Signs or symptoms of abuse/neglect since last visito No Secondary Verification Process Completed: Yes Hospitalized since last visit: No Patient Has Alerts: Yes Implantable device outside of the clinic excluding No Patient Alerts: R ABI [place] cellular tissue based products placed in the center since last visit: Has Dressing in Place as Prescribed: Yes Has Compression in Place as Prescribed: Yes Pain Present Now: No Electronic Signature(s) Signed: 05/29/2023 11:08:45 AM By: Samuella Bruin Entered By: Samuella Bruin on 05/29/2023 05:02:30 -------------------------------------------------------------------------------- Encounter Discharge Information Details Patient Name: Date of Service: [name] 05/29/2023 8:15 A M Medical Record Number: [id number] Patient Account Number: [id number] Date of Birth/Sex: Treating RN: Sep 20, 1927 (89 y.o. [name] Primary Care Zaven Klemens: Seabron Spates Other Clinician: Referring Kelyse Pask: Treating Mizraim Harmening/Extender: Verner Mould in  Treatment: 4 Encounter Discharge Information Items Post Procedure Vitals Discharge Condition: Stable Temperature (F): 97.9 Ambulatory Status: Wheelchair Pulse (bpm): 80 Discharge Destination: Home Respiratory Rate (breaths/min): 16 Transportation: Private Auto Blood Pressure (mmHg): 177/73 Accompanied By: daughter Schedule Follow-up Appointment: Yes Clinical Summary of Care: Patient Declined Electronic Signature(s) Signed: 05/29/2023 11:08:45 AM By: Samuella Bruin Entered By: Samuella Bruin on 05/29/2023 05:59:06 [name] ([id number]) [id number] Page 2 of 9 -------------------------------------------------------------------------------- Lower Extremity Assessment Details Patient Name: Date of Service: [name] 05/29/2023 8:15 A M Medical Record Number: [id number] Patient Account Number: [id number] Date of Birth/Sex: Treating RN: 1928/07/21 (89 y.o. [name] Primary Care Alexis Mizuno: Seabron Spates Other Clinician: Referring Washington Whedbee: Treating Jr Milliron/Extender: Verner Mould in Treatment: 4 Edema Assessment Assessed: Kyra Searles: No] [Right: No] [Left: Edema] [Right: :] Calf Left: Right: Point of Measurement: 31 cm From Medial Instep 27.5 cm 25 cm Ankle Left: Right: Point of Measurement: 8 cm From Medial Instep 19 cm 20 cm Vascular Assessment Pulses: Dorsalis Pedis Palpable: [Left:Yes] [Right:Yes] Extremity colors, hair growth, and conditions: Extremity Color: [Right:Pale] Hair Growth on Extremity: [Right:No] Temperature of Extremity: [Right:Warm] Capillary Refill: [Right:> 3 seconds] Dependent Rubor: [Right:No No] Electronic Signature(s) Signed: 05/29/2023 11:08:45 AM By: Samuella Bruin Entered By: Samuella Bruin on 05/29/2023 05:06:42 -------------------------------------------------------------------------------- Multi Wound Chart Details Patient Name: Date of  Service: [name] 05/29/2023 8:15 A M Medical Record Number: [id number] Patient Account Number: [id number] Date of Birth/Sex: Treating RN: [birth year] (89 y.o. F) Primary Care Gennie Eisinger: Seabron Spates Other Clinician: Referring Lynda Wanninger: Treating Willistine Ferrall/Extender: Verner Mould in Treatment: 4 Vital Signs Height(in): 62 Pulse(bpm): 80 Weight(lbs): 85 Blood Pressure(mmHg): 177/73 Body Mass Index(BMI): 15.5 Temperature(F): 97.9 Respiratory Rate(breaths/min): 16 [4:Photos:] [N/A:N/A [id number] Page 3 of 9] Right, Distal, Anterior Lower Leg Left, Anterior Lower Leg N/A Wound Location: Hematoma Skin T ear/Laceration N/A Wounding Event: Trauma, Other Skin T ear N/A Primary Etiology: Cataracts, Anemia, Hypotension, Cataracts, Anemia, Hypotension, N/A Comorbid History:  [id number] Page 5 of 9 Wound/Skin Impairment Nursing Diagnoses: Impaired tissue integrity Goals: Patient/caregiver will verbalize understanding of skin care regimen Date Initiated: 05/01/2023 Target Resolution Date: 07/25/2023 Goal Status: Active Interventions: Assess ulceration(s) every visit Treatment Activities: Skin care regimen initiated : 05/01/2023 Notes: Electronic Signature(s) Signed: 05/29/2023 11:08:45 AM By: Samuella Bruin Entered By: Samuella Bruin on 05/29/2023  05:16:02 -------------------------------------------------------------------------------- Pain Assessment Details Patient Name: Date of Service: [name] 05/29/2023 8:15 A M Medical Record Number: [id number] Patient Account Number: [id number] Date of Birth/Sex: Treating RN: 1927-12-11 (89 y.o. [name] Primary Care Song Garris: Seabron Spates Other Clinician: Referring Olaf Mesa: Treating Brandice Busser/Extender: Verner Mould in Treatment: 4 Active Problems Location of Pain Severity and Description of Pain Patient Has Paino No Site Locations Rate the pain. Current Pain Level: 0 Pain Management and Medication Current Pain Management: Electronic Signature(s) Signed: 05/29/2023 11:08:45 AM By: Samuella Bruin Entered By: Samuella Bruin on 05/29/2023 05:02:51 LAVON, HORN ([id number]) [id number] Page 6 of 9 -------------------------------------------------------------------------------- Patient/Caregiver Education Details Patient Name: Date of Service: [name] 10/4/2024andnbsp8:15 A M Medical Record Number: [id number] Patient Account Number: [id number] Date of Birth/Gender: Treating RN: 02/24/1928 (89 y.o. [name] Primary Care Physician: Seabron Spates Other Clinician: Referring Physician: Treating Physician/Extender: Verner Mould in Treatment: 4 Education Assessment Education Provided To: Patient Education Topics Provided Wound/Skin Impairment: Methods: Explain/Verbal Responses: Reinforcements needed, State content correctly Electronic Signature(s) Signed: 05/29/2023 11:08:45 AM By: Samuella Bruin Entered By: Samuella Bruin on 05/29/2023 05:16:11 -------------------------------------------------------------------------------- Wound Assessment Details Patient Name: Date of Service: [name] 05/29/2023 8:15 A M Medical  Record Number: [id number] Patient Account Number: [id number] Date of Birth/Sex: Treating RN: [birth year] (89 y.o. [name] Primary Care Maeleigh Buschman: Seabron Spates Other Clinician: Referring Aprile Dickenson: Treating Kaile Bixler/Extender: Verner Mould in Treatment: 4 Wound Status Wound Number: 4 Primary Etiology: Trauma, Other Wound Location: Right, Distal, Anterior Lower Leg Wound Status: Open Wounding Event: Hematoma Comorbid History: Cataracts, Anemia, Hypotension, Myocardial Infarction Date Acquired: 04/05/2023 Weeks Of Treatment: 4 Clustered Wound: No Photos Wound Measurements Length: (cm) 0 Width: (cm) 0 Depth: (cm) 0 Area: (cm) Volume: (cm) Wease, Jyssica H ([id number]) .5 % Reduction in Area: 94.3% .5 % Reduction in Volume: 94.2% .1 Epithelialization: Medium (34-66%) 0.196 Tunneling: No 0.02 Undermining: No [id number] Page 7 of 9 Wound Description Classification: Full Thickness Without Exposed Support Structures Wound Margin: Fibrotic scar, thickened scar Exudate Amount: Medium Exudate Type: Serosanguineous Exudate Color: red, brown Foul Odor After Cleansing: No Slough/Fibrino No Wound Bed Granulation Amount: Large (67-100%) Exposed Structure Granulation Quality: Red Fascia Exposed: No Necrotic Amount: Small (1-33%) Fat Layer (Subcutaneous Tissue) Exposed: Yes Necrotic Quality: Eschar Tendon Exposed: No Muscle Exposed: No Joint Exposed: No Bone Exposed: No Periwound Skin Texture Texture Color No Abnormalities Noted: No No Abnormalities Noted: No Callus: No Atrophie Blanche: No Crepitus: No Cyanosis: No Excoriation: No Ecchymosis: No Induration: No Erythema: No Rash: No Hemosiderin Staining: Yes Scarring: No Mottled: No Pallor: No Moisture Rubor: No No Abnormalities Noted: Yes Temperature / Pain Temperature: No Abnormality Treatment Notes Wound #4 (Lower Leg) Wound Laterality:  Right, Anterior, Distal Cleanser Soap and Water Discharge Instruction: May shower and wash wound with dial antibacterial soap and water prior to dressing change. Vashe 5.8 (oz) Discharge Instruction: Cleanse the wound with Vashe prior to applying a clean dressing using gauze sponges, not tissue or cotton balls. Peri-Wound Care Sween Lotion (Moisturizing lotion) Discharge Instruction: Apply moisturizing lotion as  [id number] Page 5 of 9 Wound/Skin Impairment Nursing Diagnoses: Impaired tissue integrity Goals: Patient/caregiver will verbalize understanding of skin care regimen Date Initiated: 05/01/2023 Target Resolution Date: 07/25/2023 Goal Status: Active Interventions: Assess ulceration(s) every visit Treatment Activities: Skin care regimen initiated : 05/01/2023 Notes: Electronic Signature(s) Signed: 05/29/2023 11:08:45 AM By: Samuella Bruin Entered By: Samuella Bruin on 05/29/2023  05:16:02 -------------------------------------------------------------------------------- Pain Assessment Details Patient Name: Date of Service: [name] 05/29/2023 8:15 A M Medical Record Number: [id number] Patient Account Number: [id number] Date of Birth/Sex: Treating RN: 1927-12-11 (89 y.o. [name] Primary Care Song Garris: Seabron Spates Other Clinician: Referring Olaf Mesa: Treating Brandice Busser/Extender: Verner Mould in Treatment: 4 Active Problems Location of Pain Severity and Description of Pain Patient Has Paino No Site Locations Rate the pain. Current Pain Level: 0 Pain Management and Medication Current Pain Management: Electronic Signature(s) Signed: 05/29/2023 11:08:45 AM By: Samuella Bruin Entered By: Samuella Bruin on 05/29/2023 05:02:51 LAVON, HORN ([id number]) [id number] Page 6 of 9 -------------------------------------------------------------------------------- Patient/Caregiver Education Details Patient Name: Date of Service: [name] 10/4/2024andnbsp8:15 A M Medical Record Number: [id number] Patient Account Number: [id number] Date of Birth/Gender: Treating RN: 02/24/1928 (89 y.o. [name] Primary Care Physician: Seabron Spates Other Clinician: Referring Physician: Treating Physician/Extender: Verner Mould in Treatment: 4 Education Assessment Education Provided To: Patient Education Topics Provided Wound/Skin Impairment: Methods: Explain/Verbal Responses: Reinforcements needed, State content correctly Electronic Signature(s) Signed: 05/29/2023 11:08:45 AM By: Samuella Bruin Entered By: Samuella Bruin on 05/29/2023 05:16:11 -------------------------------------------------------------------------------- Wound Assessment Details Patient Name: Date of Service: [name] 05/29/2023 8:15 A M Medical  Record Number: [id number] Patient Account Number: [id number] Date of Birth/Sex: Treating RN: [birth year] (89 y.o. [name] Primary Care Maeleigh Buschman: Seabron Spates Other Clinician: Referring Aprile Dickenson: Treating Kaile Bixler/Extender: Verner Mould in Treatment: 4 Wound Status Wound Number: 4 Primary Etiology: Trauma, Other Wound Location: Right, Distal, Anterior Lower Leg Wound Status: Open Wounding Event: Hematoma Comorbid History: Cataracts, Anemia, Hypotension, Myocardial Infarction Date Acquired: 04/05/2023 Weeks Of Treatment: 4 Clustered Wound: No Photos Wound Measurements Length: (cm) 0 Width: (cm) 0 Depth: (cm) 0 Area: (cm) Volume: (cm) Wease, Jyssica H ([id number]) .5 % Reduction in Area: 94.3% .5 % Reduction in Volume: 94.2% .1 Epithelialization: Medium (34-66%) 0.196 Tunneling: No 0.02 Undermining: No [id number] Page 7 of 9 Wound Description Classification: Full Thickness Without Exposed Support Structures Wound Margin: Fibrotic scar, thickened scar Exudate Amount: Medium Exudate Type: Serosanguineous Exudate Color: red, brown Foul Odor After Cleansing: No Slough/Fibrino No Wound Bed Granulation Amount: Large (67-100%) Exposed Structure Granulation Quality: Red Fascia Exposed: No Necrotic Amount: Small (1-33%) Fat Layer (Subcutaneous Tissue) Exposed: Yes Necrotic Quality: Eschar Tendon Exposed: No Muscle Exposed: No Joint Exposed: No Bone Exposed: No Periwound Skin Texture Texture Color No Abnormalities Noted: No No Abnormalities Noted: No Callus: No Atrophie Blanche: No Crepitus: No Cyanosis: No Excoriation: No Ecchymosis: No Induration: No Erythema: No Rash: No Hemosiderin Staining: Yes Scarring: No Mottled: No Pallor: No Moisture Rubor: No No Abnormalities Noted: Yes Temperature / Pain Temperature: No Abnormality Treatment Notes Wound #4 (Lower Leg) Wound Laterality:  Right, Anterior, Distal Cleanser Soap and Water Discharge Instruction: May shower and wash wound with dial antibacterial soap and water prior to dressing change. Vashe 5.8 (oz) Discharge Instruction: Cleanse the wound with Vashe prior to applying a clean dressing using gauze sponges, not tissue or cotton balls. Peri-Wound Care Sween Lotion (Moisturizing lotion) Discharge Instruction: Apply moisturizing lotion as  [id number] Page 5 of 9 Wound/Skin Impairment Nursing Diagnoses: Impaired tissue integrity Goals: Patient/caregiver will verbalize understanding of skin care regimen Date Initiated: 05/01/2023 Target Resolution Date: 07/25/2023 Goal Status: Active Interventions: Assess ulceration(s) every visit Treatment Activities: Skin care regimen initiated : 05/01/2023 Notes: Electronic Signature(s) Signed: 05/29/2023 11:08:45 AM By: Samuella Bruin Entered By: Samuella Bruin on 05/29/2023  05:16:02 -------------------------------------------------------------------------------- Pain Assessment Details Patient Name: Date of Service: [name] 05/29/2023 8:15 A M Medical Record Number: [id number] Patient Account Number: [id number] Date of Birth/Sex: Treating RN: 1927-12-11 (89 y.o. [name] Primary Care Song Garris: Seabron Spates Other Clinician: Referring Olaf Mesa: Treating Brandice Busser/Extender: Verner Mould in Treatment: 4 Active Problems Location of Pain Severity and Description of Pain Patient Has Paino No Site Locations Rate the pain. Current Pain Level: 0 Pain Management and Medication Current Pain Management: Electronic Signature(s) Signed: 05/29/2023 11:08:45 AM By: Samuella Bruin Entered By: Samuella Bruin on 05/29/2023 05:02:51 LAVON, HORN ([id number]) [id number] Page 6 of 9 -------------------------------------------------------------------------------- Patient/Caregiver Education Details Patient Name: Date of Service: [name] 10/4/2024andnbsp8:15 A M Medical Record Number: [id number] Patient Account Number: [id number] Date of Birth/Gender: Treating RN: 02/24/1928 (89 y.o. [name] Primary Care Physician: Seabron Spates Other Clinician: Referring Physician: Treating Physician/Extender: Verner Mould in Treatment: 4 Education Assessment Education Provided To: Patient Education Topics Provided Wound/Skin Impairment: Methods: Explain/Verbal Responses: Reinforcements needed, State content correctly Electronic Signature(s) Signed: 05/29/2023 11:08:45 AM By: Samuella Bruin Entered By: Samuella Bruin on 05/29/2023 05:16:11 -------------------------------------------------------------------------------- Wound Assessment Details Patient Name: Date of Service: [name] 05/29/2023 8:15 A M Medical  Record Number: [id number] Patient Account Number: [id number] Date of Birth/Sex: Treating RN: [birth year] (89 y.o. [name] Primary Care Maeleigh Buschman: Seabron Spates Other Clinician: Referring Aprile Dickenson: Treating Kaile Bixler/Extender: Verner Mould in Treatment: 4 Wound Status Wound Number: 4 Primary Etiology: Trauma, Other Wound Location: Right, Distal, Anterior Lower Leg Wound Status: Open Wounding Event: Hematoma Comorbid History: Cataracts, Anemia, Hypotension, Myocardial Infarction Date Acquired: 04/05/2023 Weeks Of Treatment: 4 Clustered Wound: No Photos Wound Measurements Length: (cm) 0 Width: (cm) 0 Depth: (cm) 0 Area: (cm) Volume: (cm) Wease, Jyssica H ([id number]) .5 % Reduction in Area: 94.3% .5 % Reduction in Volume: 94.2% .1 Epithelialization: Medium (34-66%) 0.196 Tunneling: No 0.02 Undermining: No [id number] Page 7 of 9 Wound Description Classification: Full Thickness Without Exposed Support Structures Wound Margin: Fibrotic scar, thickened scar Exudate Amount: Medium Exudate Type: Serosanguineous Exudate Color: red, brown Foul Odor After Cleansing: No Slough/Fibrino No Wound Bed Granulation Amount: Large (67-100%) Exposed Structure Granulation Quality: Red Fascia Exposed: No Necrotic Amount: Small (1-33%) Fat Layer (Subcutaneous Tissue) Exposed: Yes Necrotic Quality: Eschar Tendon Exposed: No Muscle Exposed: No Joint Exposed: No Bone Exposed: No Periwound Skin Texture Texture Color No Abnormalities Noted: No No Abnormalities Noted: No Callus: No Atrophie Blanche: No Crepitus: No Cyanosis: No Excoriation: No Ecchymosis: No Induration: No Erythema: No Rash: No Hemosiderin Staining: Yes Scarring: No Mottled: No Pallor: No Moisture Rubor: No No Abnormalities Noted: Yes Temperature / Pain Temperature: No Abnormality Treatment Notes Wound #4 (Lower Leg) Wound Laterality:  Right, Anterior, Distal Cleanser Soap and Water Discharge Instruction: May shower and wash wound with dial antibacterial soap and water prior to dressing change. Vashe 5.8 (oz) Discharge Instruction: Cleanse the wound with Vashe prior to applying a clean dressing using gauze sponges, not tissue or cotton balls. Peri-Wound Care Sween Lotion (Moisturizing lotion) Discharge Instruction: Apply moisturizing lotion as

## 2023-05-29 NOTE — Progress Notes (Signed)
[name], [name] ([id number]) [id number] Page 1 of 10 Visit Report for 05/29/2023 Chief Complaint Document Details Patient Name: Date of Service: [name] 05/29/2023 8:15 A M Medical Record Number: [id number] Patient Account Number: [id number] Date of Birth/Sex: Treating RN: Sep 21, 1927 (89 y.o. F) Primary Care Provider: Seabron Spates Other Clinician: Referring Provider: Treating Provider/Extender: Verner Mould in Treatment: 4 Information Obtained from: Patient Chief Complaint 07/26/2018; patient is here for review of laceration injuries on the left anterior tibia area 05/01/2023; patient presents for right lower extremity wounds in the setting of trauma Electronic Signature(s) Signed: 05/29/2023 8:20:48 AM By: Duanne Guess MD FACS Entered By: Duanne Guess on 05/29/2023 05:20:47 -------------------------------------------------------------------------------- Debridement Details Patient Name: Date of Service: [name] 05/29/2023 8:15 A M Medical Record Number: [id number] Patient Account Number: [id number] Date of Birth/Sex: Treating RN: February 08, 1928 (89 y.o. [name] Primary Care Provider: Seabron Spates Other Clinician: Referring Provider: Treating Provider/Extender: Verner Mould in Treatment: 4 Debridement Performed for Assessment: Wound #4 Right,Distal,Anterior Lower Leg Performed By: Physician Duanne Guess, MD The following information was scribed by: Samuella Bruin The information was scribed for: Duanne Guess Debridement Type: Debridement Level of Consciousness (Pre-procedure): Awake and Alert Pre-procedure Verification/Time Out Yes - 08:15 Taken: Start Time: 08:15 Pain Control: Lidocaine 4% Topical Solution Percent of Wound Bed Debrided: 100% T Area Debrided (cm): otal 0.2 Tissue and other material debrided: Non-Viable, Eschar,  Slough, Slough Level: Non-Viable Tissue Debridement Description: Selective/Open Wound Instrument: Curette Bleeding: Minimum Hemostasis Achieved: Pressure Response to Treatment: Procedure was tolerated well Level of Consciousness (Post- Awake and Alert procedure): Post Debridement Measurements of Total Wound Length: (cm) 0.5 Width: (cm) 0.5 Depth: (cm) 0.1 Volume: (cm) 0.02 Character of Wound/Ulcer Post Debridement: Improved [name], [name] ([id number]) [phone].pdf Page 2 of 10 Post Procedure Diagnosis Same as Pre-procedure Electronic Signature(s) Signed: 05/29/2023 8:39:17 AM By: Duanne Guess MD FACS Signed: 05/29/2023 11:08:45 AM By: Samuella Bruin Entered By: Samuella Bruin on 05/29/2023 05:19:57 -------------------------------------------------------------------------------- HPI Details Patient Name: Date of Service: [name] 05/29/2023 8:15 A M Medical Record Number: [id number] Patient Account Number: [id number] Date of Birth/Sex: Treating RN: 03/11/1928 (89 y.o. F) Primary Care Provider: Seabron Spates Other Clinician: Referring Provider: Treating Provider/Extender: Verner Mould in Treatment: 4 History of Present Illness HPI Description: ADMISSION 07/26/2018 This is an 89 year old woman who lives independently in her own apartment. 3 weeks ago she suffered a fall traumatizing her anterior lower legs against a small table. She developed laceration injuries on the left and right tibial areas. The area on the right has since healed. She has been seeing her primary physician at [facility]. She has had a round of Keflex and more recently a round of Levaquin for periwound cellulitis. According to her daughter she seemed to respond better to the Levaquin. I am not aware of any cultures. They have been using antibiotic cream, T and gauze to dress the wounds. As mentioned an area on elfa the right  anterior tibia just below the tibial tuberosity has healed. Past medical history; the patient is not a diabetic nor does she have any history of PAD. She has a history of anemia, depression, gastroesophageal reflux disease, bilateral hearing loss, hyperlipidemia, hypothyroidism, coronary artery artery disease osteoporosis and hypothyroidism she has a history of renal cell carcinoma the exact status of this is uncertain In our clinic her ABIs were very poor at 0.33 and 0.25. Her nurse reported that  and viable with just a small open portion with some slough and eschar present. 05/29/2023: The skin tear on her left anterior leg has healed. The remaining wound on her right lower leg is about half the size that it was last week. There is a small amount of eschar around the edges and some slough on the surface. Electronic Signature(s) Signed: 05/29/2023 8:21:28 AM By: Duanne Guess MD FACS Portal, Signed: 05/29/2023 8:21:28 AM By: Duanne Guess MD FACS [name] ([id number]) [id number] Page 3 of 10 Entered By: Duanne Guess on 05/29/2023 05:21:28 -------------------------------------------------------------------------------- Physical Exam Details Patient Name: Date of Service: [name] 05/29/2023 8:15 A M Medical Record Number: [id number] Patient Account Number: [id number] Date of Birth/Sex: Treating RN: 1928/01/23 (89 y.o. F) Primary Care Provider: Seabron Spates Other Clinician: Referring Provider: Treating Provider/Extender: Verner Mould in Treatment: 4 Constitutional Hypertensive, asymptomatic. . . . no acute distress. Respiratory Normal work of breathing on room air. Notes 05/29/2023: The skin tear on her left anterior leg has healed. The remaining wound on her right lower leg is about half the size that it was last week. There is a small amount of eschar around the edges and some slough on the surface. Electronic Signature(s) Signed: 05/29/2023 8:22:55 AM By: Duanne Guess MD FACS Entered By: Duanne Guess on 05/29/2023 05:22:55 -------------------------------------------------------------------------------- Physician Orders Details Patient Name:  Date of Service: [name] 05/29/2023 8:15 A M Medical Record Number: [id number] Patient Account Number: [id number] Date of Birth/Sex: Treating RN: 12-08-1927 (89 y.o. [name] Primary Care Provider: Seabron Spates Other Clinician: Referring Provider: Treating Provider/Extender: Verner Mould in Treatment: 4 The following information was scribed by: Samuella Bruin The information was scribed for: Duanne Guess Verbal / Phone Orders: No Diagnosis Coding Follow-up Appointments ppointment in 1 week. - Dr. Lady Gary Room 2 Return A Anesthetic (In clinic) Topical Lidocaine 5% applied to wound bed Bathing/ Shower/ Hygiene May shower with protection but do not get wound dressing(s) wet. Protect dressing(s) with water repellant cover (for example, large plastic bag) or a cast cover and may then take shower. - Please cover right leg if showering. Please keep the right leg wraps dry Edema Control - Lymphedema / SCD / Other Right Lower Extremity Elevate legs to the level of the heart or above for 30 minutes daily and/or when sitting for 3-4 times a day throughout the day. Avoid standing for long periods of time. Exercise regularly - As tolerated Wound Treatment Wound #4 - Lower Leg Wound Laterality: Right, Anterior, Distal Cleanser: Soap and Water 1 x Per Week/30 Days Discharge Instructions: May shower and wash wound with dial antibacterial soap and water prior to dressing change. [name], [name] ([id number]) [id number] Page 4 of 10 Cleanser: Vashe 5.8 (oz) 1 x Per Week/30 Days Discharge Instructions: Cleanse the wound with Vashe prior to applying a clean dressing using gauze sponges, not tissue or cotton balls. Peri-Wound Care: Sween Lotion (Moisturizing lotion) 1 x Per Week/30 Days Discharge Instructions: Apply moisturizing lotion as directed Topical: Gentamicin 1 x Per Week/30 Days Discharge  Instructions: As directed by physician Topical: Mupirocin Ointment 1 x Per Week/30 Days Discharge Instructions: Apply Mupirocin (Bactroban) as instructed Prim Dressing: PolyMem Silver Non-Adhesive Dressing, 4.25x4.25 in 1 x Per Week/30 Days ary Discharge Instructions: Apply to wound bed as instructed Secondary Dressing: Woven Gauze Sponge, Non-Sterile 4x4 in 1 x Per Week/30 Days Discharge Instructions: Apply over primary dressing as directed. Secured With: [employer],  and viable with just a small open portion with some slough and eschar present. 05/29/2023: The skin tear on her left anterior leg has healed. The remaining wound on her right lower leg is about half the size that it was last week. There is a small amount of eschar around the edges and some slough on the surface. Electronic Signature(s) Signed: 05/29/2023 8:21:28 AM By: Duanne Guess MD FACS Portal, Signed: 05/29/2023 8:21:28 AM By: Duanne Guess MD FACS [name] ([id number]) [id number] Page 3 of 10 Entered By: Duanne Guess on 05/29/2023 05:21:28 -------------------------------------------------------------------------------- Physical Exam Details Patient Name: Date of Service: [name] 05/29/2023 8:15 A M Medical Record Number: [id number] Patient Account Number: [id number] Date of Birth/Sex: Treating RN: 1928/01/23 (89 y.o. F) Primary Care Provider: Seabron Spates Other Clinician: Referring Provider: Treating Provider/Extender: Verner Mould in Treatment: 4 Constitutional Hypertensive, asymptomatic. . . . no acute distress. Respiratory Normal work of breathing on room air. Notes 05/29/2023: The skin tear on her left anterior leg has healed. The remaining wound on her right lower leg is about half the size that it was last week. There is a small amount of eschar around the edges and some slough on the surface. Electronic Signature(s) Signed: 05/29/2023 8:22:55 AM By: Duanne Guess MD FACS Entered By: Duanne Guess on 05/29/2023 05:22:55 -------------------------------------------------------------------------------- Physician Orders Details Patient Name:  Date of Service: [name] 05/29/2023 8:15 A M Medical Record Number: [id number] Patient Account Number: [id number] Date of Birth/Sex: Treating RN: 12-08-1927 (89 y.o. [name] Primary Care Provider: Seabron Spates Other Clinician: Referring Provider: Treating Provider/Extender: Verner Mould in Treatment: 4 The following information was scribed by: Samuella Bruin The information was scribed for: Duanne Guess Verbal / Phone Orders: No Diagnosis Coding Follow-up Appointments ppointment in 1 week. - Dr. Lady Gary Room 2 Return A Anesthetic (In clinic) Topical Lidocaine 5% applied to wound bed Bathing/ Shower/ Hygiene May shower with protection but do not get wound dressing(s) wet. Protect dressing(s) with water repellant cover (for example, large plastic bag) or a cast cover and may then take shower. - Please cover right leg if showering. Please keep the right leg wraps dry Edema Control - Lymphedema / SCD / Other Right Lower Extremity Elevate legs to the level of the heart or above for 30 minutes daily and/or when sitting for 3-4 times a day throughout the day. Avoid standing for long periods of time. Exercise regularly - As tolerated Wound Treatment Wound #4 - Lower Leg Wound Laterality: Right, Anterior, Distal Cleanser: Soap and Water 1 x Per Week/30 Days Discharge Instructions: May shower and wash wound with dial antibacterial soap and water prior to dressing change. [name], [name] ([id number]) [id number] Page 4 of 10 Cleanser: Vashe 5.8 (oz) 1 x Per Week/30 Days Discharge Instructions: Cleanse the wound with Vashe prior to applying a clean dressing using gauze sponges, not tissue or cotton balls. Peri-Wound Care: Sween Lotion (Moisturizing lotion) 1 x Per Week/30 Days Discharge Instructions: Apply moisturizing lotion as directed Topical: Gentamicin 1 x Per Week/30 Days Discharge  Instructions: As directed by physician Topical: Mupirocin Ointment 1 x Per Week/30 Days Discharge Instructions: Apply Mupirocin (Bactroban) as instructed Prim Dressing: PolyMem Silver Non-Adhesive Dressing, 4.25x4.25 in 1 x Per Week/30 Days ary Discharge Instructions: Apply to wound bed as instructed Secondary Dressing: Woven Gauze Sponge, Non-Sterile 4x4 in 1 x Per Week/30 Days Discharge Instructions: Apply over primary dressing as directed. Secured With: [employer],  [name], [name] ([id number]) [id number] Page 1 of 10 Visit Report for 05/29/2023 Chief Complaint Document Details Patient Name: Date of Service: [name] 05/29/2023 8:15 A M Medical Record Number: [id number] Patient Account Number: [id number] Date of Birth/Sex: Treating RN: Sep 21, 1927 (89 y.o. F) Primary Care Provider: Seabron Spates Other Clinician: Referring Provider: Treating Provider/Extender: Verner Mould in Treatment: 4 Information Obtained from: Patient Chief Complaint 07/26/2018; patient is here for review of laceration injuries on the left anterior tibia area 05/01/2023; patient presents for right lower extremity wounds in the setting of trauma Electronic Signature(s) Signed: 05/29/2023 8:20:48 AM By: Duanne Guess MD FACS Entered By: Duanne Guess on 05/29/2023 05:20:47 -------------------------------------------------------------------------------- Debridement Details Patient Name: Date of Service: [name] 05/29/2023 8:15 A M Medical Record Number: [id number] Patient Account Number: [id number] Date of Birth/Sex: Treating RN: February 08, 1928 (89 y.o. [name] Primary Care Provider: Seabron Spates Other Clinician: Referring Provider: Treating Provider/Extender: Verner Mould in Treatment: 4 Debridement Performed for Assessment: Wound #4 Right,Distal,Anterior Lower Leg Performed By: Physician Duanne Guess, MD The following information was scribed by: Samuella Bruin The information was scribed for: Duanne Guess Debridement Type: Debridement Level of Consciousness (Pre-procedure): Awake and Alert Pre-procedure Verification/Time Out Yes - 08:15 Taken: Start Time: 08:15 Pain Control: Lidocaine 4% Topical Solution Percent of Wound Bed Debrided: 100% T Area Debrided (cm): otal 0.2 Tissue and other material debrided: Non-Viable, Eschar,  Slough, Slough Level: Non-Viable Tissue Debridement Description: Selective/Open Wound Instrument: Curette Bleeding: Minimum Hemostasis Achieved: Pressure Response to Treatment: Procedure was tolerated well Level of Consciousness (Post- Awake and Alert procedure): Post Debridement Measurements of Total Wound Length: (cm) 0.5 Width: (cm) 0.5 Depth: (cm) 0.1 Volume: (cm) 0.02 Character of Wound/Ulcer Post Debridement: Improved [name], [name] ([id number]) [phone].pdf Page 2 of 10 Post Procedure Diagnosis Same as Pre-procedure Electronic Signature(s) Signed: 05/29/2023 8:39:17 AM By: Duanne Guess MD FACS Signed: 05/29/2023 11:08:45 AM By: Samuella Bruin Entered By: Samuella Bruin on 05/29/2023 05:19:57 -------------------------------------------------------------------------------- HPI Details Patient Name: Date of Service: [name] 05/29/2023 8:15 A M Medical Record Number: [id number] Patient Account Number: [id number] Date of Birth/Sex: Treating RN: 03/11/1928 (89 y.o. F) Primary Care Provider: Seabron Spates Other Clinician: Referring Provider: Treating Provider/Extender: Verner Mould in Treatment: 4 History of Present Illness HPI Description: ADMISSION 07/26/2018 This is an 89 year old woman who lives independently in her own apartment. 3 weeks ago she suffered a fall traumatizing her anterior lower legs against a small table. She developed laceration injuries on the left and right tibial areas. The area on the right has since healed. She has been seeing her primary physician at [facility]. She has had a round of Keflex and more recently a round of Levaquin for periwound cellulitis. According to her daughter she seemed to respond better to the Levaquin. I am not aware of any cultures. They have been using antibiotic cream, T and gauze to dress the wounds. As mentioned an area on elfa the right  anterior tibia just below the tibial tuberosity has healed. Past medical history; the patient is not a diabetic nor does she have any history of PAD. She has a history of anemia, depression, gastroesophageal reflux disease, bilateral hearing loss, hyperlipidemia, hypothyroidism, coronary artery artery disease osteoporosis and hypothyroidism she has a history of renal cell carcinoma the exact status of this is uncertain In our clinic her ABIs were very poor at 0.33 and 0.25. Her nurse reported that  eschar around the edges and some slough on the surface. I used a curette to debride slough and eschar from her right lower leg wound. Will continue topical gentamicin with mupirocin, PolyMem Ag and Kerlix and Coban wrap. Follow-up in 1 week. Electronic Signature(s) Signed: 05/29/2023 8:23:59 AM By: Duanne Guess MD FACS Entered By: Duanne Guess on 05/29/2023 05:23:59 -------------------------------------------------------------------------------- HxROS Details Patient Name: Date of Service: [name] 05/29/2023 8:15 A M Medical Record Number: [id number] Patient Account Number: [id number] Date of Birth/Sex: Treating RN: [birth year] (89 y.o. F) Primary Care Provider: Seabron Spates Other Clinician: Referring Provider: Treating Provider/Extender: Verner Mould in Treatment: 4 Information Obtained From Patient Chart Constitutional Symptoms (General Health) Medical History: Past Medical History Notes: almost bedridden per dgtr - difficult to stand up and sit down safely Eyes Medical History: Positive for: Cataracts - removed X2 Ear/Nose/Mouth/Throat Medical History: Past Medical History  Notes: totally deaf on left ; partial hearing with hearing aide on the left Hematologic/Lymphatic Medical History: Positive for: Anemia - s/p heart surgery Cardiovascular Medical History: Positive for: Hypotension; Myocardial Infarction Past Medical History Notes: atherosclerosis Endocrine Medical History: Past Medical History Notes: hypothyroid Integumentary (Skin) Medical History: Negative for: History of Burn Past Medical History Notes: wound of right Lower leg Musculoskeletal Medical History: Negative for: Gout; Rheumatoid Arthritis; Osteoarthritis; Osteomyelitis [name], [name] ([id number]) [id number] Page 9 of 10 Oncologic Medical History: Negative for: Received Chemotherapy; Received Radiation Past Medical History Notes: 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 (no chemo or radiation for this) Psychiatric Medical History: Past Medical History Notes: depression HBO Extended History Items Eyes: Cataracts Immunizations Pneumococcal Vaccine: Received Pneumococcal Vaccination: Yes Received Pneumococcal Vaccination On or After 60th Birthday: Yes Tetanus Vaccine: Last tetanus shot: 06/08/2016 Implantable Devices None Hospitalization / Surgery History Type of Hospitalization/Surgery renal mass ablation syncope Family and Social History Cancer: Yes - Siblings; Diabetes: No; Heart Disease: Yes - Mother; Hereditary Spherocytosis: No; Hypertension: Yes - Mother; Kidney Disease: Yes - Siblings; Lung Disease: No; Seizures: No; Stroke: No; Thyroid Problems: No; Tuberculosis: No; Former smoker - quit 60 years ago; Marital Status - Widowed; Alcohol Use: Never; Drug Use: No History; Caffeine Use: Daily - coffee; Financial Concerns: No; Food, Clothing or Shelter Needs: No; Support System Lacking: No; Transportation Concerns: No Electronic Signature(s) Signed: 05/29/2023 8:39:17 AM By: Duanne Guess MD FACS Entered By: Duanne Guess on 05/29/2023 05:22:10 -------------------------------------------------------------------------------- SuperBill Details Patient Name: Date of Service: [name] 05/29/2023 Medical Record Number: [id number] Patient Account Number: [id number] Date of Birth/Sex: Treating RN: 1928-06-20 (89 y.o. F) Primary Care Provider: Seabron Spates Other Clinician: Referring Provider: Treating Provider/Extender: Verner Mould in Treatment: 4 Diagnosis Coding ICD-10 Codes Code Description [phone] Non-pressure chronic ulcer of other part of right lower leg with fat layer exposed L97.822 Non-pressure chronic ulcer of other part of left lower leg with fat layer exposed I87.311 Chronic venous hypertension (idiopathic) with ulcer of right lower extremity J44.9 Chronic obstructive pulmonary disease, unspecified T79.8XXD Other early complications of trauma, subsequent encounter Facility Procedures : [name], [name] Code: [id number] [name] ([id number] ICD- Description: [phone] - DEBRIDE WOUND 1ST 20 SQ CM OR < ) [id number] Diagnosis Description L97.812 Non-pressure chronic ulcer of other part of right lower leg with fat layer expose Modifier: [id number] d Quantity: 1 .pdf Page 10 of 10 Physician Procedures : CPT4 Code Description Modifier [id number] - WC PHYS LEVEL 4 - EST PT 25 ICD-10 Diagnosis  eschar around the edges and some slough on the surface. I used a curette to debride slough and eschar from her right lower leg wound. Will continue topical gentamicin with mupirocin, PolyMem Ag and Kerlix and Coban wrap. Follow-up in 1 week. Electronic Signature(s) Signed: 05/29/2023 8:23:59 AM By: Duanne Guess MD FACS Entered By: Duanne Guess on 05/29/2023 05:23:59 -------------------------------------------------------------------------------- HxROS Details Patient Name: Date of Service: [name] 05/29/2023 8:15 A M Medical Record Number: [id number] Patient Account Number: [id number] Date of Birth/Sex: Treating RN: [birth year] (89 y.o. F) Primary Care Provider: Seabron Spates Other Clinician: Referring Provider: Treating Provider/Extender: Verner Mould in Treatment: 4 Information Obtained From Patient Chart Constitutional Symptoms (General Health) Medical History: Past Medical History Notes: almost bedridden per dgtr - difficult to stand up and sit down safely Eyes Medical History: Positive for: Cataracts - removed X2 Ear/Nose/Mouth/Throat Medical History: Past Medical History  Notes: totally deaf on left ; partial hearing with hearing aide on the left Hematologic/Lymphatic Medical History: Positive for: Anemia - s/p heart surgery Cardiovascular Medical History: Positive for: Hypotension; Myocardial Infarction Past Medical History Notes: atherosclerosis Endocrine Medical History: Past Medical History Notes: hypothyroid Integumentary (Skin) Medical History: Negative for: History of Burn Past Medical History Notes: wound of right Lower leg Musculoskeletal Medical History: Negative for: Gout; Rheumatoid Arthritis; Osteoarthritis; Osteomyelitis [name], [name] ([id number]) [id number] Page 9 of 10 Oncologic Medical History: Negative for: Received Chemotherapy; Received Radiation Past Medical History Notes: 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 (no chemo or radiation for this) Psychiatric Medical History: Past Medical History Notes: depression HBO Extended History Items Eyes: Cataracts Immunizations Pneumococcal Vaccine: Received Pneumococcal Vaccination: Yes Received Pneumococcal Vaccination On or After 60th Birthday: Yes Tetanus Vaccine: Last tetanus shot: 06/08/2016 Implantable Devices None Hospitalization / Surgery History Type of Hospitalization/Surgery renal mass ablation syncope Family and Social History Cancer: Yes - Siblings; Diabetes: No; Heart Disease: Yes - Mother; Hereditary Spherocytosis: No; Hypertension: Yes - Mother; Kidney Disease: Yes - Siblings; Lung Disease: No; Seizures: No; Stroke: No; Thyroid Problems: No; Tuberculosis: No; Former smoker - quit 60 years ago; Marital Status - Widowed; Alcohol Use: Never; Drug Use: No History; Caffeine Use: Daily - coffee; Financial Concerns: No; Food, Clothing or Shelter Needs: No; Support System Lacking: No; Transportation Concerns: No Electronic Signature(s) Signed: 05/29/2023 8:39:17 AM By: Duanne Guess MD FACS Entered By: Duanne Guess on 05/29/2023 05:22:10 -------------------------------------------------------------------------------- SuperBill Details Patient Name: Date of Service: [name] 05/29/2023 Medical Record Number: [id number] Patient Account Number: [id number] Date of Birth/Sex: Treating RN: 1928-06-20 (89 y.o. F) Primary Care Provider: Seabron Spates Other Clinician: Referring Provider: Treating Provider/Extender: Verner Mould in Treatment: 4 Diagnosis Coding ICD-10 Codes Code Description [phone] Non-pressure chronic ulcer of other part of right lower leg with fat layer exposed L97.822 Non-pressure chronic ulcer of other part of left lower leg with fat layer exposed I87.311 Chronic venous hypertension (idiopathic) with ulcer of right lower extremity J44.9 Chronic obstructive pulmonary disease, unspecified T79.8XXD Other early complications of trauma, subsequent encounter Facility Procedures : [name], [name] Code: [id number] [name] ([id number] ICD- Description: [phone] - DEBRIDE WOUND 1ST 20 SQ CM OR < ) [id number] Diagnosis Description L97.812 Non-pressure chronic ulcer of other part of right lower leg with fat layer expose Modifier: [id number] d Quantity: 1 .pdf Page 10 of 10 Physician Procedures : CPT4 Code Description Modifier [id number] - WC PHYS LEVEL 4 - EST PT 25 ICD-10 Diagnosis  [name], [name] ([id number]) [id number] Page 1 of 10 Visit Report for 05/29/2023 Chief Complaint Document Details Patient Name: Date of Service: [name] 05/29/2023 8:15 A M Medical Record Number: [id number] Patient Account Number: [id number] Date of Birth/Sex: Treating RN: Sep 21, 1927 (89 y.o. F) Primary Care Provider: Seabron Spates Other Clinician: Referring Provider: Treating Provider/Extender: Verner Mould in Treatment: 4 Information Obtained from: Patient Chief Complaint 07/26/2018; patient is here for review of laceration injuries on the left anterior tibia area 05/01/2023; patient presents for right lower extremity wounds in the setting of trauma Electronic Signature(s) Signed: 05/29/2023 8:20:48 AM By: Duanne Guess MD FACS Entered By: Duanne Guess on 05/29/2023 05:20:47 -------------------------------------------------------------------------------- Debridement Details Patient Name: Date of Service: [name] 05/29/2023 8:15 A M Medical Record Number: [id number] Patient Account Number: [id number] Date of Birth/Sex: Treating RN: February 08, 1928 (89 y.o. [name] Primary Care Provider: Seabron Spates Other Clinician: Referring Provider: Treating Provider/Extender: Verner Mould in Treatment: 4 Debridement Performed for Assessment: Wound #4 Right,Distal,Anterior Lower Leg Performed By: Physician Duanne Guess, MD The following information was scribed by: Samuella Bruin The information was scribed for: Duanne Guess Debridement Type: Debridement Level of Consciousness (Pre-procedure): Awake and Alert Pre-procedure Verification/Time Out Yes - 08:15 Taken: Start Time: 08:15 Pain Control: Lidocaine 4% Topical Solution Percent of Wound Bed Debrided: 100% T Area Debrided (cm): otal 0.2 Tissue and other material debrided: Non-Viable, Eschar,  Slough, Slough Level: Non-Viable Tissue Debridement Description: Selective/Open Wound Instrument: Curette Bleeding: Minimum Hemostasis Achieved: Pressure Response to Treatment: Procedure was tolerated well Level of Consciousness (Post- Awake and Alert procedure): Post Debridement Measurements of Total Wound Length: (cm) 0.5 Width: (cm) 0.5 Depth: (cm) 0.1 Volume: (cm) 0.02 Character of Wound/Ulcer Post Debridement: Improved [name], [name] ([id number]) [phone].pdf Page 2 of 10 Post Procedure Diagnosis Same as Pre-procedure Electronic Signature(s) Signed: 05/29/2023 8:39:17 AM By: Duanne Guess MD FACS Signed: 05/29/2023 11:08:45 AM By: Samuella Bruin Entered By: Samuella Bruin on 05/29/2023 05:19:57 -------------------------------------------------------------------------------- HPI Details Patient Name: Date of Service: [name] 05/29/2023 8:15 A M Medical Record Number: [id number] Patient Account Number: [id number] Date of Birth/Sex: Treating RN: 03/11/1928 (89 y.o. F) Primary Care Provider: Seabron Spates Other Clinician: Referring Provider: Treating Provider/Extender: Verner Mould in Treatment: 4 History of Present Illness HPI Description: ADMISSION 07/26/2018 This is an 89 year old woman who lives independently in her own apartment. 3 weeks ago she suffered a fall traumatizing her anterior lower legs against a small table. She developed laceration injuries on the left and right tibial areas. The area on the right has since healed. She has been seeing her primary physician at [facility]. She has had a round of Keflex and more recently a round of Levaquin for periwound cellulitis. According to her daughter she seemed to respond better to the Levaquin. I am not aware of any cultures. They have been using antibiotic cream, T and gauze to dress the wounds. As mentioned an area on elfa the right  anterior tibia just below the tibial tuberosity has healed. Past medical history; the patient is not a diabetic nor does she have any history of PAD. She has a history of anemia, depression, gastroesophageal reflux disease, bilateral hearing loss, hyperlipidemia, hypothyroidism, coronary artery artery disease osteoporosis and hypothyroidism she has a history of renal cell carcinoma the exact status of this is uncertain In our clinic her ABIs were very poor at 0.33 and 0.25. Her nurse reported that  [name], [name] ([id number]) [id number] Page 1 of 10 Visit Report for 05/29/2023 Chief Complaint Document Details Patient Name: Date of Service: [name] 05/29/2023 8:15 A M Medical Record Number: [id number] Patient Account Number: [id number] Date of Birth/Sex: Treating RN: Sep 21, 1927 (89 y.o. F) Primary Care Provider: Seabron Spates Other Clinician: Referring Provider: Treating Provider/Extender: Verner Mould in Treatment: 4 Information Obtained from: Patient Chief Complaint 07/26/2018; patient is here for review of laceration injuries on the left anterior tibia area 05/01/2023; patient presents for right lower extremity wounds in the setting of trauma Electronic Signature(s) Signed: 05/29/2023 8:20:48 AM By: Duanne Guess MD FACS Entered By: Duanne Guess on 05/29/2023 05:20:47 -------------------------------------------------------------------------------- Debridement Details Patient Name: Date of Service: [name] 05/29/2023 8:15 A M Medical Record Number: [id number] Patient Account Number: [id number] Date of Birth/Sex: Treating RN: February 08, 1928 (89 y.o. [name] Primary Care Provider: Seabron Spates Other Clinician: Referring Provider: Treating Provider/Extender: Verner Mould in Treatment: 4 Debridement Performed for Assessment: Wound #4 Right,Distal,Anterior Lower Leg Performed By: Physician Duanne Guess, MD The following information was scribed by: Samuella Bruin The information was scribed for: Duanne Guess Debridement Type: Debridement Level of Consciousness (Pre-procedure): Awake and Alert Pre-procedure Verification/Time Out Yes - 08:15 Taken: Start Time: 08:15 Pain Control: Lidocaine 4% Topical Solution Percent of Wound Bed Debrided: 100% T Area Debrided (cm): otal 0.2 Tissue and other material debrided: Non-Viable, Eschar,  Slough, Slough Level: Non-Viable Tissue Debridement Description: Selective/Open Wound Instrument: Curette Bleeding: Minimum Hemostasis Achieved: Pressure Response to Treatment: Procedure was tolerated well Level of Consciousness (Post- Awake and Alert procedure): Post Debridement Measurements of Total Wound Length: (cm) 0.5 Width: (cm) 0.5 Depth: (cm) 0.1 Volume: (cm) 0.02 Character of Wound/Ulcer Post Debridement: Improved [name], [name] ([id number]) [phone].pdf Page 2 of 10 Post Procedure Diagnosis Same as Pre-procedure Electronic Signature(s) Signed: 05/29/2023 8:39:17 AM By: Duanne Guess MD FACS Signed: 05/29/2023 11:08:45 AM By: Samuella Bruin Entered By: Samuella Bruin on 05/29/2023 05:19:57 -------------------------------------------------------------------------------- HPI Details Patient Name: Date of Service: [name] 05/29/2023 8:15 A M Medical Record Number: [id number] Patient Account Number: [id number] Date of Birth/Sex: Treating RN: 03/11/1928 (89 y.o. F) Primary Care Provider: Seabron Spates Other Clinician: Referring Provider: Treating Provider/Extender: Verner Mould in Treatment: 4 History of Present Illness HPI Description: ADMISSION 07/26/2018 This is an 89 year old woman who lives independently in her own apartment. 3 weeks ago she suffered a fall traumatizing her anterior lower legs against a small table. She developed laceration injuries on the left and right tibial areas. The area on the right has since healed. She has been seeing her primary physician at [facility]. She has had a round of Keflex and more recently a round of Levaquin for periwound cellulitis. According to her daughter she seemed to respond better to the Levaquin. I am not aware of any cultures. They have been using antibiotic cream, T and gauze to dress the wounds. As mentioned an area on elfa the right  anterior tibia just below the tibial tuberosity has healed. Past medical history; the patient is not a diabetic nor does she have any history of PAD. She has a history of anemia, depression, gastroesophageal reflux disease, bilateral hearing loss, hyperlipidemia, hypothyroidism, coronary artery artery disease osteoporosis and hypothyroidism she has a history of renal cell carcinoma the exact status of this is uncertain In our clinic her ABIs were very poor at 0.33 and 0.25. Her nurse reported that  2x10 (in/yd) 1 x Per Week/30 Days Discharge Instructions: Secure dressing with tape as directed. Compression Wrap: Kerlix Roll 4.5x3.1 (in/yd) 1 x Per Week/30 Days Discharge Instructions: Apply Kerlix and Coban compression as directed. Compression Wrap: Coban Self-Adherent Wrap 4x5 (in/yd) 1 x Per Week/30 Days Discharge Instructions: Apply over Kerlix as directed. Compression Wrap: Tubular netting #5 1 x Per Week/30 Days Patient Medications llergies: trazodone, codeine, clarithromycin, diazepam, Sulfa (Sulfonamide Antibiotics), Prilosec A Notifications Medication Indication Start End 05/29/2023 lidocaine DOSE topical 4 % cream - cream topical Electronic Signature(s) Signed: 05/29/2023 8:39:17 AM By: Duanne Guess MD FACS Entered By: Duanne Guess on 05/29/2023 05:23:10 -------------------------------------------------------------------------------- Problem List Details Patient Name: Date of Service: [name] 05/29/2023 8:15 A M Medical Record Number: [id number] Patient Account Number: [id number] Date of Birth/Sex: Treating RN: March 10, 1928 (89 y.o. F) Primary Care Provider: Seabron Spates Other Clinician: Referring Provider: Treating Provider/Extender: Verner Mould in Treatment: 4 Active Problems ICD-10 Encounter Code Description Active Date MDM Diagnosis L97.812 Non-pressure chronic ulcer of other part of right lower leg with fat layer 05/01/2023 No  Yes exposed L97.822 Non-pressure chronic ulcer of other part of left lower leg with fat layer exposed9/27/2024 No Yes [name], [name] ([id number]) [phone].pdf Page 5 of 10 I87.311 Chronic venous hypertension (idiopathic) with ulcer of right lower extremity 05/01/2023 No Yes J44.9 Chronic obstructive pulmonary disease, unspecified 05/01/2023 No Yes T79.8XXD Other early complications of trauma, subsequent encounter 05/01/2023 No Yes Inactive Problems Resolved Problems Electronic Signature(s) Signed: 05/29/2023 8:20:31 AM By: Duanne Guess MD FACS Entered By: Duanne Guess on 05/29/2023 05:20:31 -------------------------------------------------------------------------------- Progress Note Details Patient Name: Date of Service: [name] 05/29/2023 8:15 A M Medical Record Number: [id number] Patient Account Number: [id number] Date of Birth/Sex: Treating RN: 12-31-1927 (89 y.o. F) Primary Care Provider: Seabron Spates Other Clinician: Referring Provider: Treating Provider/Extender: Verner Mould in Treatment: 4 Subjective Chief Complaint Information obtained from Patient 07/26/2018; patient is here for review of laceration injuries on the left anterior tibia area 05/01/2023; patient presents for right lower extremity wounds in the setting of trauma History of Present Illness (HPI) ADMISSION 07/26/2018 This is an 89 year old woman who lives independently in her own apartment. 3 weeks ago she suffered a fall traumatizing her anterior lower legs against a small table. She developed laceration injuries on the left and right tibial areas. The area on the right has since healed. She has been seeing her primary physician at [facility]. She has had a round of Keflex and more recently a round of Levaquin for periwound cellulitis. According to her daughter she seemed to respond better to the Levaquin. I am not aware of any cultures. They  have been using antibiotic cream, T and gauze to dress the wounds. As mentioned an area on elfa the right anterior tibia just below the tibial tuberosity has healed. Past medical history; the patient is not a diabetic nor does she have any history of PAD. She has a history of anemia, depression, gastroesophageal reflux disease, bilateral hearing loss, hyperlipidemia, hypothyroidism, coronary artery artery disease osteoporosis and hypothyroidism she has a history of renal cell carcinoma the exact status of this is uncertain In our clinic her ABIs were very poor at 0.33 and 0.25. Her nurse reported that the waveforms were difficult to Doppler. Interestingly the patient describes before these wounds she would walk for 45 minutes a day without any suggestion of pain in her legs that would suggest claudication. 08/02/2018 the patient's

## 2023-06-05 ENCOUNTER — Encounter (HOSPITAL_BASED_OUTPATIENT_CLINIC_OR_DEPARTMENT_OTHER): Payer: Medicare Other | Admitting: General Surgery

## 2023-06-05 DIAGNOSIS — S81801A Unspecified open wound, right lower leg, initial encounter: Secondary | ICD-10-CM | POA: Diagnosis not present

## 2023-06-05 DIAGNOSIS — I779 Disorder of arteries and arterioles, unspecified: Secondary | ICD-10-CM | POA: Diagnosis not present

## 2023-06-05 DIAGNOSIS — J449 Chronic obstructive pulmonary disease, unspecified: Secondary | ICD-10-CM | POA: Diagnosis not present

## 2023-06-05 DIAGNOSIS — D649 Anemia, unspecified: Secondary | ICD-10-CM | POA: Diagnosis not present

## 2023-06-05 DIAGNOSIS — I10 Essential (primary) hypertension: Secondary | ICD-10-CM | POA: Diagnosis not present

## 2023-06-05 DIAGNOSIS — I872 Venous insufficiency (chronic) (peripheral): Secondary | ICD-10-CM | POA: Diagnosis not present

## 2023-06-05 DIAGNOSIS — Z87891 Personal history of nicotine dependence: Secondary | ICD-10-CM | POA: Diagnosis not present

## 2023-06-05 DIAGNOSIS — I87311 Chronic venous hypertension (idiopathic) with ulcer of right lower extremity: Secondary | ICD-10-CM | POA: Diagnosis not present

## 2023-06-05 DIAGNOSIS — L97822 Non-pressure chronic ulcer of other part of left lower leg with fat layer exposed: Secondary | ICD-10-CM | POA: Diagnosis not present

## 2023-06-05 DIAGNOSIS — L97812 Non-pressure chronic ulcer of other part of right lower leg with fat layer exposed: Secondary | ICD-10-CM | POA: Diagnosis not present

## 2023-06-05 DIAGNOSIS — E785 Hyperlipidemia, unspecified: Secondary | ICD-10-CM | POA: Diagnosis not present

## 2023-06-05 DIAGNOSIS — E039 Hypothyroidism, unspecified: Secondary | ICD-10-CM | POA: Diagnosis not present

## 2023-06-05 NOTE — Progress Notes (Signed)
[name], [name] ([id number]) [id number] Page 1 of 10 Visit Report for 06/05/2023 Chief Complaint Document Details Patient Name: Date of Service: [name] 06/05/2023 8:15 A M Medical Record Number: [id number] Patient Account Number: [id number] Date of Birth/Sex: Treating RN: March 07, 1928 (89 y.o. F) Primary Care Provider: Seabron Spates Other Clinician: Referring Provider: Treating Provider/Extender: Verner Mould in Treatment: 5 Information Obtained from: Patient Chief Complaint 07/26/2018; patient is here for review of laceration injuries on the left anterior tibia area 05/01/2023; patient presents for right lower extremity wounds in the setting of trauma Electronic Signature(s) Signed: 06/05/2023 8:35:53 AM By: Duanne Guess MD FACS Entered By: Duanne Guess on 06/05/2023 05:35:53 -------------------------------------------------------------------------------- Debridement Details Patient Name: Date of Service: [name] 06/05/2023 8:15 A M Medical Record Number: [id number] Patient Account Number: [id number] Date of Birth/Sex: Treating RN: 1928-01-30 (89 y.o. [name] Primary Care Provider: Seabron Spates Other Clinician: Referring Provider: Treating Provider/Extender: Verner Mould in Treatment: 5 Debridement Performed for Assessment: Wound #4 Right,Distal,Anterior Lower Leg Performed By: Physician Duanne Guess, MD The following information was scribed by: Samuella Bruin The information was scribed for: Duanne Guess Debridement Type: Debridement Level of Consciousness (Pre-procedure): Awake and Alert Pre-procedure Verification/Time Out Yes - 08:26 Taken: Start Time: 08:26 Pain Control: Lidocaine 4% Topical Solution Percent of Wound Bed Debrided: 100% T Area Debrided (cm): otal 0.13 Tissue and other material debrided: Non-Viable,  Eschar Level: Non-Viable Tissue Debridement Description: Selective/Open Wound Instrument: Curette Bleeding: Minimum Hemostasis Achieved: Pressure Response to Treatment: Procedure was tolerated well Level of Consciousness (Post- Awake and Alert procedure): Post Debridement Measurements of Total Wound Length: (cm) 0.4 Width: (cm) 0.4 Depth: (cm) 0.1 Volume: (cm) 0.013 Character of Wound/Ulcer Post Debridement: Improved [name], [name] ([id number]) [id number] Page 2 of 10 Post Procedure Diagnosis Same as Pre-procedure Electronic Signature(s) Signed: 06/05/2023 9:20:16 AM By: Duanne Guess MD FACS Signed: 06/05/2023 11:32:03 AM By: Gelene Mink By: Samuella Bruin on 06/05/2023 05:28:28 -------------------------------------------------------------------------------- HPI Details Patient Name: Date of Service: [name] 06/05/2023 8:15 A M Medical Record Number: [id number] Patient Account Number: [id number] Date of Birth/Sex: Treating RN: 07-Jun-1928 (89 y.o. F) Primary Care Provider: Seabron Spates Other Clinician: Referring Provider: Treating Provider/Extender: Verner Mould in Treatment: 5 History of Present Illness HPI Description: ADMISSION 07/26/2018 This is an 89 year old woman who lives independently in her own apartment. 3 weeks ago she suffered a fall traumatizing her anterior lower legs against a small table. She developed laceration injuries on the left and right tibial areas. The area on the right has since healed. She has been seeing her primary physician at [facility]. She has had a round of Keflex and more recently a round of Levaquin for periwound cellulitis. According to her daughter she seemed to respond better to the Levaquin. I am not aware of any cultures. They have been using antibiotic cream, T and gauze to dress the wounds. As mentioned an area on elfa the right anterior  tibia just below the tibial tuberosity has healed. Past medical history; the patient is not a diabetic nor does she have any history of PAD. She has a history of anemia, depression, gastroesophageal reflux disease, bilateral hearing loss, hyperlipidemia, hypothyroidism, coronary artery artery disease osteoporosis and hypothyroidism she has a history of renal cell carcinoma the exact status of this is uncertain In our clinic her ABIs were very poor at 0.33 and 0.25. Her nurse reported that the waveforms  with just a small open portion with some slough and eschar present. 05/29/2023: The skin tear on her left anterior leg has healed. The remaining wound on her right lower leg is about half the size that it was last week. There is a small amount of eschar around the edges and some slough on the surface. 06/05/2023: The right lower leg wound continues to contract. There is good granulation tissue on the surface with a little bit of dried eschar around the edges. Electronic Signature(s) [name], [name] ([id number]) [id number] Page 3 of 10 Signed: 06/05/2023 8:36:28 AM By: Duanne Guess MD FACS Entered By: Duanne Guess on 06/05/2023 65:78:46 -------------------------------------------------------------------------------- Physical Exam Details Patient Name: Date of Service: [name] 06/05/2023 8:15 A M Medical Record Number: [id number] Patient Account Number: [id number] Date of Birth/Sex: Treating RN: 21-Dec-1927 (89 y.o. F) Primary Care Provider: Seabron Spates Other Clinician: Referring Provider: Treating Provider/Extender: Verner Mould in Treatment: 5 Constitutional Slightly hypertensive. . . . no acute distress. Respiratory Normal work of breathing on room air. Notes 06/05/2023: The right lower leg wound continues to contract. There is good granulation tissue on the surface with a little bit of dried eschar around the edges. Electronic Signature(s) Signed: 06/05/2023 8:37:08 AM By: Duanne Guess MD FACS Entered By: Duanne Guess on 06/05/2023 05:37:07 -------------------------------------------------------------------------------- Physician Orders  Details Patient Name: Date of Service: [name] 06/05/2023 8:15 A M Medical Record Number: [id number] Patient Account Number: [id number] Date of Birth/Sex: Treating RN: [birth year] (89 y.o. [name] Primary Care Provider: Seabron Spates Other Clinician: Referring Provider: Treating Provider/Extender: Verner Mould in Treatment: 5 The following information was scribed by: Samuella Bruin The information was scribed for: Duanne Guess Verbal / Phone Orders: No Diagnosis Coding ICD-10 Coding Code Description 6128824778 Non-pressure chronic ulcer of other part of right lower leg with fat layer exposed I87.311 Chronic venous hypertension (idiopathic) with ulcer of right lower extremity J44.9 Chronic obstructive pulmonary disease, unspecified T79.8XXD Other early complications of trauma, subsequent encounter Follow-up Appointments ppointment in 1 week. - Dr. Lady Gary Room 2 Return A Anesthetic (In clinic) Topical Lidocaine 4% applied to wound bed Bathing/ Shower/ Hygiene May shower with protection but do not get wound dressing(s) wet. Protect dressing(s) with water repellant cover (for example, large plastic bag) or a cast cover and may then take shower. - Please cover right leg if showering. Please keep the right leg wraps dry Edema Control - Lymphedema / SCD / [name], [name] ([id number]) [id number] Page 4 of 10 Right Lower Extremity Elevate legs to the level of the heart or above for 30 minutes daily and/or when sitting for 3-4 times a day throughout the day. Avoid standing for long periods of time. Exercise regularly - As tolerated Wound Treatment Wound #4 - Lower Leg Wound Laterality: Right, Anterior, Distal Cleanser: Soap and Water 1 x Per Week/30 Days Discharge Instructions: May shower and wash wound with dial antibacterial soap and water prior to dressing change. Cleanser: Vashe 5.8 (oz)  1 x Per Week/30 Days Discharge Instructions: Cleanse the wound with Vashe prior to applying a clean dressing using gauze sponges, not tissue or cotton balls. Peri-Wound Care: Sween Lotion (Moisturizing lotion) 1 x Per Week/30 Days Discharge Instructions: Apply moisturizing lotion as directed Topical: Gentamicin 1 x Per Week/30 Days Discharge Instructions: As directed by physician Topical: Mupirocin Ointment 1 x Per Week/30 Days Discharge Instructions: Apply Mupirocin (Bactroban) as instructed Prim Dressing: PolyMem Silver Non-Adhesive Dressing,  with just a small open portion with some slough and eschar present. 05/29/2023: The skin tear on her left anterior leg has healed. The remaining wound on her right lower leg is about half the size that it was last week. There is a small amount of eschar around the edges and some slough on the surface. 06/05/2023: The right lower leg wound continues to contract. There is good granulation tissue on the surface with a little bit of dried eschar around the edges. Electronic Signature(s) [name], [name] ([id number]) [id number] Page 3 of 10 Signed: 06/05/2023 8:36:28 AM By: Duanne Guess MD FACS Entered By: Duanne Guess on 06/05/2023 65:78:46 -------------------------------------------------------------------------------- Physical Exam Details Patient Name: Date of Service: [name] 06/05/2023 8:15 A M Medical Record Number: [id number] Patient Account Number: [id number] Date of Birth/Sex: Treating RN: 21-Dec-1927 (89 y.o. F) Primary Care Provider: Seabron Spates Other Clinician: Referring Provider: Treating Provider/Extender: Verner Mould in Treatment: 5 Constitutional Slightly hypertensive. . . . no acute distress. Respiratory Normal work of breathing on room air. Notes 06/05/2023: The right lower leg wound continues to contract. There is good granulation tissue on the surface with a little bit of dried eschar around the edges. Electronic Signature(s) Signed: 06/05/2023 8:37:08 AM By: Duanne Guess MD FACS Entered By: Duanne Guess on 06/05/2023 05:37:07 -------------------------------------------------------------------------------- Physician Orders  Details Patient Name: Date of Service: [name] 06/05/2023 8:15 A M Medical Record Number: [id number] Patient Account Number: [id number] Date of Birth/Sex: Treating RN: [birth year] (89 y.o. [name] Primary Care Provider: Seabron Spates Other Clinician: Referring Provider: Treating Provider/Extender: Verner Mould in Treatment: 5 The following information was scribed by: Samuella Bruin The information was scribed for: Duanne Guess Verbal / Phone Orders: No Diagnosis Coding ICD-10 Coding Code Description [id number] Non-pressure chronic ulcer of other part of right lower leg with fat layer exposed I87.311 Chronic venous hypertension (idiopathic) with ulcer of right lower extremity J44.9 Chronic obstructive pulmonary disease, unspecified T79.8XXD Other early complications of trauma, subsequent encounter Follow-up Appointments ppointment in 1 week. - Dr. Lady Gary Room 2 Return A Anesthetic (In clinic) Topical Lidocaine 4% applied to wound bed Bathing/ Shower/ Hygiene May shower with protection but do not get wound dressing(s) wet. Protect dressing(s) with water repellant cover (for example, large plastic bag) or a cast cover and may then take shower. - Please cover right leg if showering. Please keep the right leg wraps dry Edema Control - Lymphedema / SCD / [name], [name] ([id number]) [id number] Page 4 of 10 Right Lower Extremity Elevate legs to the level of the heart or above for 30 minutes daily and/or when sitting for 3-4 times a day throughout the day. Avoid standing for long periods of time. Exercise regularly - As tolerated Wound Treatment Wound #4 - Lower Leg Wound Laterality: Right, Anterior, Distal Cleanser: Soap and Water 1 x Per Week/30 Days Discharge Instructions: May shower and wash wound with dial antibacterial soap and water prior to dressing change. Cleanser: Vashe 5.8 (oz)  1 x Per Week/30 Days Discharge Instructions: Cleanse the wound with Vashe prior to applying a clean dressing using gauze sponges, not tissue or cotton balls. Peri-Wound Care: Sween Lotion (Moisturizing lotion) 1 x Per Week/30 Days Discharge Instructions: Apply moisturizing lotion as directed Topical: Gentamicin 1 x Per Week/30 Days Discharge Instructions: As directed by physician Topical: Mupirocin Ointment 1 x Per Week/30 Days Discharge Instructions: Apply Mupirocin (Bactroban) as instructed Prim Dressing: PolyMem Silver Non-Adhesive Dressing,  [name], [name] ([id number]) [id number] Page 1 of 10 Visit Report for 06/05/2023 Chief Complaint Document Details Patient Name: Date of Service: [name] 06/05/2023 8:15 A M Medical Record Number: [id number] Patient Account Number: [id number] Date of Birth/Sex: Treating RN: March 07, 1928 (89 y.o. F) Primary Care Provider: Seabron Spates Other Clinician: Referring Provider: Treating Provider/Extender: Verner Mould in Treatment: 5 Information Obtained from: Patient Chief Complaint 07/26/2018; patient is here for review of laceration injuries on the left anterior tibia area 05/01/2023; patient presents for right lower extremity wounds in the setting of trauma Electronic Signature(s) Signed: 06/05/2023 8:35:53 AM By: Duanne Guess MD FACS Entered By: Duanne Guess on 06/05/2023 05:35:53 -------------------------------------------------------------------------------- Debridement Details Patient Name: Date of Service: [name] 06/05/2023 8:15 A M Medical Record Number: [id number] Patient Account Number: [id number] Date of Birth/Sex: Treating RN: 1928-01-30 (89 y.o. [name] Primary Care Provider: Seabron Spates Other Clinician: Referring Provider: Treating Provider/Extender: Verner Mould in Treatment: 5 Debridement Performed for Assessment: Wound #4 Right,Distal,Anterior Lower Leg Performed By: Physician Duanne Guess, MD The following information was scribed by: Samuella Bruin The information was scribed for: Duanne Guess Debridement Type: Debridement Level of Consciousness (Pre-procedure): Awake and Alert Pre-procedure Verification/Time Out Yes - 08:26 Taken: Start Time: 08:26 Pain Control: Lidocaine 4% Topical Solution Percent of Wound Bed Debrided: 100% T Area Debrided (cm): otal 0.13 Tissue and other material debrided: Non-Viable,  Eschar Level: Non-Viable Tissue Debridement Description: Selective/Open Wound Instrument: Curette Bleeding: Minimum Hemostasis Achieved: Pressure Response to Treatment: Procedure was tolerated well Level of Consciousness (Post- Awake and Alert procedure): Post Debridement Measurements of Total Wound Length: (cm) 0.4 Width: (cm) 0.4 Depth: (cm) 0.1 Volume: (cm) 0.013 Character of Wound/Ulcer Post Debridement: Improved [name], [name] ([id number]) [id number] Page 2 of 10 Post Procedure Diagnosis Same as Pre-procedure Electronic Signature(s) Signed: 06/05/2023 9:20:16 AM By: Duanne Guess MD FACS Signed: 06/05/2023 11:32:03 AM By: Gelene Mink By: Samuella Bruin on 06/05/2023 05:28:28 -------------------------------------------------------------------------------- HPI Details Patient Name: Date of Service: [name] 06/05/2023 8:15 A M Medical Record Number: [id number] Patient Account Number: [id number] Date of Birth/Sex: Treating RN: 07-Jun-1928 (89 y.o. F) Primary Care Provider: Seabron Spates Other Clinician: Referring Provider: Treating Provider/Extender: Verner Mould in Treatment: 5 History of Present Illness HPI Description: ADMISSION 07/26/2018 This is an 89 year old woman who lives independently in her own apartment. 3 weeks ago she suffered a fall traumatizing her anterior lower legs against a small table. She developed laceration injuries on the left and right tibial areas. The area on the right has since healed. She has been seeing her primary physician at [facility]. She has had a round of Keflex and more recently a round of Levaquin for periwound cellulitis. According to her daughter she seemed to respond better to the Levaquin. I am not aware of any cultures. They have been using antibiotic cream, T and gauze to dress the wounds. As mentioned an area on elfa the right anterior  tibia just below the tibial tuberosity has healed. Past medical history; the patient is not a diabetic nor does she have any history of PAD. She has a history of anemia, depression, gastroesophageal reflux disease, bilateral hearing loss, hyperlipidemia, hypothyroidism, coronary artery artery disease osteoporosis and hypothyroidism she has a history of renal cell carcinoma the exact status of this is uncertain In our clinic her ABIs were very poor at 0.33 and 0.25. Her nurse reported that the waveforms  Obtained From Patient Chart Constitutional Symptoms (General Health) Medical History: Past Medical History Notes: almost bedridden per dgtr - difficult to stand up and sit down safely Eyes Medical History: Positive for: Cataracts - removed X2 Ear/Nose/Mouth/Throat Medical History: Past Medical History Notes: totally deaf on left ; partial hearing with hearing aide on the left Hematologic/Lymphatic Medical History: Positive for: Anemia - s/p heart surgery Cardiovascular Medical History: Positive for: Hypotension; Myocardial Infarction Past Medical History Notes: atherosclerosis Endocrine Medical History: Past Medical History Notes: hypothyroid Integumentary (Skin) Medical History: Negative for: History of Burn Past Medical History Notes: wound of right Lower leg [name], [name] ([id number])  [phone].pdf Page 9 of 10 Musculoskeletal Medical History: Negative for: Gout; Rheumatoid Arthritis; Osteoarthritis; Osteomyelitis Oncologic Medical History: Negative for: Received Chemotherapy; Received Radiation Past Medical History Notes: 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 (no chemo or radiation for this) Psychiatric Medical History: Past Medical History Notes: depression HBO Extended History Items Eyes: Cataracts Immunizations Pneumococcal Vaccine: Received Pneumococcal Vaccination: Yes Received Pneumococcal Vaccination On or After 60th Birthday: Yes Tetanus Vaccine: Last tetanus shot: 06/08/2016 Implantable Devices None Hospitalization / Surgery History Type of Hospitalization/Surgery renal mass ablation syncope Family and Social History Cancer: Yes - Siblings; Diabetes: No; Heart Disease: Yes - Mother; Hereditary Spherocytosis: No; Hypertension: Yes - Mother; Kidney Disease: Yes - Siblings; Lung Disease: No; Seizures: No; Stroke: No; Thyroid Problems: No; Tuberculosis: No; Former smoker - quit 60 years ago; Marital Status - Widowed; Alcohol Use: Never; Drug Use: No History; Caffeine Use: Daily - coffee; Financial Concerns: No; Food, Clothing or Shelter Needs: No; Support System Lacking: No; Transportation Concerns: No Electronic Signature(s) Signed: 06/05/2023 9:20:16 AM By: Duanne Guess MD FACS Entered By: Duanne Guess on 06/05/2023 05:36:45 -------------------------------------------------------------------------------- SuperBill Details Patient Name: Date of Service: [name] 06/05/2023 Medical Record Number: [id number] Patient Account Number: [id number] Date of Birth/Sex: Treating RN: Jun 08, 1928 (89 y.o. F) Primary Care Provider: Seabron Spates Other Clinician: Referring Provider: Treating Provider/Extender: Verner Mould in Treatment: 5 Diagnosis  Coding ICD-10 Codes Code Description [id number] Non-pressure chronic ulcer of other part of right lower leg with fat layer exposed I87.311 Chronic venous hypertension (idiopathic) with ulcer of right lower extremity J44.9 Chronic obstructive pulmonary disease, unspecified T79.8XXD Other early complications of trauma, subsequent encounter [name], [name] ([id number]) [id number] Page 10 of 10 Facility Procedures : CPT4 Code: [id number] Description: [id number] - DEBRIDE WOUND 1ST 20 SQ CM OR < ICD-10 Diagnosis Description L97.812 Non-pressure chronic ulcer of other part of right lower leg with fat layer expos Modifier: ed Quantity: 1 Physician Procedures : CPT4 Code Description Modifier [id number] - WC PHYS LEVEL 4 - EST PT 25 ICD-10 Diagnosis Description L97.812 Non-pressure chronic ulcer of other part of right lower leg with fat layer exposed I87.311 Chronic venous hypertension (idiopathic) with  ulcer of right lower extremity J44.9 Chronic obstructive pulmonary disease, unspecified T79.8XXD Other early complications of trauma, subsequent encounter Quantity: 1 : [id number] - WC PHYS DEBR WO ANESTH 20 SQ CM ICD-10 Diagnosis Description L97.812 Non-pressure chronic ulcer of other part of right lower leg with fat layer exposed Quantity: 1 Electronic Signature(s) Signed: 06/05/2023 8:38:43 AM By: Duanne Guess MD FACS Entered By: Duanne Guess on 06/05/2023 05:38:43  4.25x4.25 in 1 x Per Week/30 Days ary Discharge Instructions: Apply to wound bed as instructed Secondary Dressing: Woven Gauze Sponge, Non-Sterile 4x4 in 1 x Per Week/30 Days Discharge Instructions: Apply over primary dressing as directed. Secured With: Transpore Surgical Tape, 2x10 (in/yd) 1 x Per Week/30 Days Discharge Instructions: Secure dressing with tape as directed. Compression Wrap: Kerlix Roll 4.5x3.1 (in/yd) 1 x Per Week/30 Days Discharge Instructions: Apply Kerlix and Coban compression as directed. Compression Wrap: Coban Self-Adherent Wrap 4x5 (in/yd) 1 x Per Week/30 Days Discharge Instructions: Apply over Kerlix as directed. Compression Wrap: Tubular netting #5 1 x Per Week/30 Days Patient Medications llergies: trazodone, codeine, clarithromycin, diazepam, Sulfa (Sulfonamide Antibiotics), Prilosec A Notifications Medication Indication Start End 06/05/2023 lidocaine DOSE topical 4 % cream - cream topical Electronic Signature(s) Signed: 06/05/2023 9:20:16 AM By: Duanne Guess MD FACS Entered By: Duanne Guess on 06/05/2023 05:37:51 -------------------------------------------------------------------------------- Problem List Details Patient Name: Date of Service: [name] 06/05/2023 8:15 A M Medical Record Number: [id number] Patient Account Number: [id number] Date of Birth/Sex: Treating RN: Jun 14, 1928 (89 y.o.  F) Primary Care Provider: Seabron Spates Other Clinician: Referring Provider: Treating Provider/Extender: Verner Mould in Treatment: 5 Active Problems ICD-10 Encounter Code Description Active Date MDM Diagnosis [name], [name] ([id number]) [id number] Page 5 of 10 [id number] Non-pressure chronic ulcer of other part of right lower leg with fat layer 05/01/2023 No Yes exposed I87.311 Chronic venous hypertension (idiopathic) with ulcer of right lower extremity 05/01/2023 No Yes J44.9 Chronic obstructive pulmonary disease, unspecified 05/01/2023 No Yes T79.8XXD Other early complications of trauma, subsequent encounter 05/01/2023 No Yes Inactive Problems Resolved Problems ICD-10 Code Description Active Date Resolved Date L97.822 Non-pressure chronic ulcer of other part of left lower leg with fat layer exposed 05/22/2023 05/22/2023 Electronic Signature(s) Signed: 06/05/2023 8:34:10 AM By: Duanne Guess MD FACS Entered By: Duanne Guess on 06/05/2023 05:34:09 -------------------------------------------------------------------------------- Progress Note Details Patient Name: Date of Service: [name] 06/05/2023 8:15 A M Medical Record Number: [id number] Patient Account Number: [id number] Date of Birth/Sex: Treating RN: 10-26-1927 (89 y.o. F) Primary Care Provider: Seabron Spates Other Clinician: Referring Provider: Treating Provider/Extender: Verner Mould in Treatment: 5 Subjective Chief Complaint Information obtained from Patient 07/26/2018; patient is here for review of laceration injuries on the left anterior tibia area 05/01/2023; patient presents for right lower extremity wounds in the setting of trauma History of Present Illness (HPI) ADMISSION 07/26/2018 This is an 89 year old woman who lives independently in her own apartment. 3 weeks ago she suffered a fall traumatizing her  anterior lower legs against a small table. She developed laceration injuries on the left and right tibial areas. The area on the right has since healed. She has been seeing her primary physician at [facility]. She has had a round of Keflex and more recently a round of Levaquin for periwound cellulitis. According to her daughter she seemed to respond better to the Levaquin. I am not aware of any cultures. They have been using antibiotic cream, T and gauze to dress the wounds. As mentioned an area on elfa the right anterior tibia just below the tibial tuberosity has healed. Past medical history; the patient is not a diabetic nor does she have any history of PAD. She has a history of anemia, depression, gastroesophageal reflux disease, bilateral hearing loss, hyperlipidemia, hypothyroidism, coronary artery artery disease osteoporosis and hypothyroidism she has a history of renal cell carcinoma the exact status of this is uncertain In our clinic  Selective/Open Wound Non-Viable Tissue Debridement with a total area of 0.13 sq cm performed by Duanne Guess, MD. With the following instrument(s): Curette to remove Non-Viable tissue/material. Material removed includes Eschar after achieving pain control using Lidocaine 4% Topical Solution. No specimens were taken. A time out was conducted at 08:26, prior to the start of the procedure. A Minimum amount of bleeding was controlled with Pressure. The procedure was tolerated well. Post Debridement Measurements: 0.4cm length x 0.4cm width x 0.1cm depth; 0.013cm^3 volume. Character of Wound/Ulcer Post Debridement is improved. Post procedure Diagnosis Wound #4: Same as Pre-Procedure Plan Follow-up Appointments: Return Appointment in 1 week. - Dr. Lady Gary Room 2 Anesthetic: (In clinic) Topical Lidocaine 4% applied to wound bed Bathing/ Shower/ Hygiene: May shower with protection but do not get wound dressing(s) wet. Protect dressing(s) with water repellant cover (for example, large plastic bag) or a cast  cover and may then take shower. - Please cover right leg if showering. Please keep the right leg wraps dry Edema Control - Lymphedema / SCD / Other: Elevate legs to the level of the heart or above for 30 minutes daily and/or when sitting for 3-4 times a day throughout the day. Avoid standing for long periods of time. Exercise regularly - As tolerated The following medication(s) was prescribed: lidocaine topical 4 % cream cream topical was prescribed at facility WOUND #4: - Lower Leg Wound Laterality: Right, Anterior, Distal Cleanser: Soap and Water 1 x Per Week/30 Days Discharge Instructions: May shower and wash wound with dial antibacterial soap and water prior to dressing change. Cleanser: Vashe 5.8 (oz) 1 x Per Week/30 Days Discharge Instructions: Cleanse the wound with Vashe prior to applying a clean dressing using gauze sponges, not tissue or cotton balls. Peri-Wound Care: Sween Lotion (Moisturizing lotion) 1 x Per Week/30 Days Discharge Instructions: Apply moisturizing lotion as directed Topical: Gentamicin 1 x Per Week/30 Days Discharge Instructions: As directed by physician Topical: Mupirocin Ointment 1 x Per Week/30 Days Discharge Instructions: Apply Mupirocin (Bactroban) as instructed Prim Dressing: PolyMem Silver Non-Adhesive Dressing, 4.25x4.25 in 1 x Per Week/30 Days ary Discharge Instructions: Apply to wound bed as instructed Secondary Dressing: Woven Gauze Sponge, Non-Sterile 4x4 in 1 x Per Week/30 Days Discharge Instructions: Apply over primary dressing as directed. Secured With: Transpore Surgical T ape, 2x10 (in/yd) 1 x Per Week/30 Days Discharge Instructions: Secure dressing with tape as directed. Com pression Wrap: Kerlix Roll 4.5x3.1 (in/yd) 1 x Per Week/30 Days Discharge Instructions: Apply Kerlix and Coban compression as directed. [name], [name] ([id number]) [id number] Page 8 of 10 Compression Wrap: Coban Self-Adherent Wrap 4x5 (in/yd) 1  x Per Week/30 Days Discharge Instructions: Apply over Kerlix as directed. Compression Wrap: Tubular netting #5 1 x Per Week/30 Days 06/05/2023: The right lower leg wound continues to contract. There is good granulation tissue on the surface with a little bit of dried eschar around the edges. I used a curette to debride the eschar from her wound. We will continue the mixture of topical gentamicin and mupirocin, with PolyMem Ag, Kerlix and Coban wrap. Follow-up in 1 week. Electronic Signature(s) Signed: 06/05/2023 8:38:26 AM By: Duanne Guess MD FACS Entered By: Duanne Guess on 06/05/2023 05:38:26 -------------------------------------------------------------------------------- HxROS Details Patient Name: Date of Service: [name] 06/05/2023 8:15 A M Medical Record Number: [id number] Patient Account Number: [id number] Date of Birth/Sex: Treating RN: 17-Jun-1928 (89 y.o. F) Primary Care Provider: Seabron Spates Other Clinician: Referring Provider: Treating Provider/Extender: Verner Mould in Treatment: 5 Information  with just a small open portion with some slough and eschar present. 05/29/2023: The skin tear on her left anterior leg has healed. The remaining wound on her right lower leg is about half the size that it was last week. There is a small amount of eschar around the edges and some slough on the surface. 06/05/2023: The right lower leg wound continues to contract. There is good granulation tissue on the surface with a little bit of dried eschar around the edges. Electronic Signature(s) [name], [name] ([id number]) [id number] Page 3 of 10 Signed: 06/05/2023 8:36:28 AM By: Duanne Guess MD FACS Entered By: Duanne Guess on 06/05/2023 65:78:46 -------------------------------------------------------------------------------- Physical Exam Details Patient Name: Date of Service: [name] 06/05/2023 8:15 A M Medical Record Number: [id number] Patient Account Number: [id number] Date of Birth/Sex: Treating RN: 21-Dec-1927 (89 y.o. F) Primary Care Provider: Seabron Spates Other Clinician: Referring Provider: Treating Provider/Extender: Verner Mould in Treatment: 5 Constitutional Slightly hypertensive. . . . no acute distress. Respiratory Normal work of breathing on room air. Notes 06/05/2023: The right lower leg wound continues to contract. There is good granulation tissue on the surface with a little bit of dried eschar around the edges. Electronic Signature(s) Signed: 06/05/2023 8:37:08 AM By: Duanne Guess MD FACS Entered By: Duanne Guess on 06/05/2023 05:37:07 -------------------------------------------------------------------------------- Physician Orders  Details Patient Name: Date of Service: [name] 06/05/2023 8:15 A M Medical Record Number: [id number] Patient Account Number: [id number] Date of Birth/Sex: Treating RN: [birth year] (89 y.o. [name] Primary Care Provider: Seabron Spates Other Clinician: Referring Provider: Treating Provider/Extender: Verner Mould in Treatment: 5 The following information was scribed by: Samuella Bruin The information was scribed for: Duanne Guess Verbal / Phone Orders: No Diagnosis Coding ICD-10 Coding Code Description [id number] Non-pressure chronic ulcer of other part of right lower leg with fat layer exposed I87.311 Chronic venous hypertension (idiopathic) with ulcer of right lower extremity J44.9 Chronic obstructive pulmonary disease, unspecified T79.8XXD Other early complications of trauma, subsequent encounter Follow-up Appointments ppointment in 1 week. - Dr. Lady Gary Room 2 Return A Anesthetic (In clinic) Topical Lidocaine 4% applied to wound bed Bathing/ Shower/ Hygiene May shower with protection but do not get wound dressing(s) wet. Protect dressing(s) with water repellant cover (for example, large plastic bag) or a cast cover and may then take shower. - Please cover right leg if showering. Please keep the right leg wraps dry Edema Control - Lymphedema / SCD / [name], [name] ([id number]) [id number] Page 4 of 10 Right Lower Extremity Elevate legs to the level of the heart or above for 30 minutes daily and/or when sitting for 3-4 times a day throughout the day. Avoid standing for long periods of time. Exercise regularly - As tolerated Wound Treatment Wound #4 - Lower Leg Wound Laterality: Right, Anterior, Distal Cleanser: Soap and Water 1 x Per Week/30 Days Discharge Instructions: May shower and wash wound with dial antibacterial soap and water prior to dressing change. Cleanser: Vashe 5.8 (oz)  1 x Per Week/30 Days Discharge Instructions: Cleanse the wound with Vashe prior to applying a clean dressing using gauze sponges, not tissue or cotton balls. Peri-Wound Care: Sween Lotion (Moisturizing lotion) 1 x Per Week/30 Days Discharge Instructions: Apply moisturizing lotion as directed Topical: Gentamicin 1 x Per Week/30 Days Discharge Instructions: As directed by physician Topical: Mupirocin Ointment 1 x Per Week/30 Days Discharge Instructions: Apply Mupirocin (Bactroban) as instructed Prim Dressing: PolyMem Silver Non-Adhesive Dressing,

## 2023-06-05 NOTE — Progress Notes (Signed)
[name] [name] ([id number]) [id number] Page 1 of 7 Visit Report for 06/05/2023 Arrival Information Details Patient Name: Date of Service: [name] [name] 06/05/2023 8:15 A M Medical Record Number: [id number] Patient Account Number: [id number] Date of Birth/Sex: Treating RN: 20-Jan-1928 (89 y.o. [name] [name] Primary Care [name] [name]: [name] [name] Other Clinician: Referring [name] [name]: Treating [name] [name]/Extender: [name] [name] in Treatment: 5 Visit Information History Since Last Visit Added or deleted any medications: No Patient Arrived: Wheel Chair Any new allergies or adverse reactions: No Arrival Time: 08:08 Had a fall or experienced change in No Accompanied By: daughter activities of daily living that may affect Transfer Assistance: Manual risk of falls: Patient Identification Verified: Yes Signs or symptoms of abuse/neglect since last visito No Secondary Verification Process Completed: Yes Hospitalized since last visit: No Patient Has Alerts: Yes Implantable device outside of the clinic excluding No Patient Alerts: R ABI [place] cellular tissue based products placed in the center since last visit: Has Dressing in Place as Prescribed: Yes Has Compression in Place as Prescribed: Yes Pain Present Now: No Electronic Signature(s) Signed: 06/05/2023 11:32:03 AM By: [name] [name] Entered By: [name] [name] on 06/05/2023 05:12:15 -------------------------------------------------------------------------------- Encounter Discharge Information Details Patient Name: Date of Service: [name] [name] 06/05/2023 8:15 A M Medical Record Number: [id number] Patient Account Number: [id number] Date of Birth/Sex: Treating RN: [birth year] (89 y.o. [name] [name] Primary Care Ares Cardozo: [name] [name] Other Clinician: Referring [name] [name]: Treating [name] [name]/Extender: [name] [name]  in Treatment: 5 Encounter Discharge Information Items Post Procedure Vitals Discharge Condition: Stable Temperature (F): 98.2 Ambulatory Status: Wheelchair Pulse (bpm): 87 Discharge Destination: Home Respiratory Rate (breaths/min): 16 Transportation: Private Auto Blood Pressure (mmHg): 148/61 Accompanied By: daughter Schedule Follow-up Appointment: Yes Clinical Summary of Care: Patient Declined Electronic Signature(s) Signed: 06/05/2023 11:32:03 AM By: [name] [name] Entered By: [name] [name] on 06/05/2023 05:38:00 [name] [name] ([id number]) [id number] Page 2 of 7 -------------------------------------------------------------------------------- Lower Extremity Assessment Details Patient Name: Date of Service: [name] [name] 06/05/2023 8:15 A M Medical Record Number: [id number] Patient Account Number: [id number] Date of Birth/Sex: Treating RN: Sep 06, 1927 (89 y.o. [name] [name] Primary Care [name] [name]: [name] [name] Other Clinician: Referring Timmi Devora: Treating [name] [name]/Extender: [name] [name] in Treatment: 5 Edema Assessment Assessed: [Left: No] [Right: No] [Left: Edema] [Right: :] Calf Left: Right: Point of Measurement: 31 cm From Medial Instep 27.5 cm 26 cm Ankle Left: Right: Point of Measurement: 8 cm From Medial Instep 19 cm 20 cm Vascular Assessment Pulses: Dorsalis Pedis Palpable: [Right:Yes] Extremity colors, hair growth, and conditions: Extremity Color: [Right:Pale] Hair Growth on Extremity: [Right:No] Temperature of Extremity: [Right:Warm] Capillary Refill: [Right:> 3 seconds] Dependent Rubor: [Right:No No] Electronic Signature(s) Signed: 06/05/2023 11:32:03 AM By: [name] [name] Entered By: [name] [name] on 06/05/2023 05:15:16 -------------------------------------------------------------------------------- Multi Wound Chart Details Patient Name: Date of Service: [name]  [name] [name] 06/05/2023 8:15 A M Medical Record Number: [id number] Patient Account Number: [id number] Date of Birth/Sex: Treating RN: 1928-01-17 (89 y.o. F) Primary Care [name] [name]: [name] [name] Other Clinician: Referring [name] [name]: Treating [name] [name]/Extender: [name] [name] in Treatment: 5 Vital Signs Height(in): 62 Pulse(bpm): 87 Weight(lbs): 85 Blood Pressure(mmHg): 148/61 Body Mass Index(BMI): 15.5 Temperature(F): 98.2 Respiratory Rate(breaths/min): 16 [4:Photos:] [N/A:N/A] Right, Distal, Anterior Lower Leg N/A N/A Wound Location: Hematoma N/A N/A Wounding Event: Trauma, Other N/A N/A Primary Etiology: Cataracts, Anemia, Hypotension, N/A N/A Comorbid History: Myocardial Infarction 04/05/2023 N/A N/A Date Acquired: 5 N/A N/A Weeks of Treatment: Open N/A  N/A Wound Status: No N/A N/A Wound Recurrence: 0.4x0.4x0.1 N/A N/A Measurements L x W x D (cm) 0.126 N/A N/A A (cm) : rea 0.013 N/A N/A Volume (cm) : 96.40% N/A N/A % Reduction in A rea: 96.20% N/A N/A % Reduction in Volume: Full Thickness Without Exposed N/A N/A Classification: Support Structures Medium N/A N/A Exudate A mount: Serosanguineous N/A N/A Exudate Type: red, brown N/A N/A Exudate Color: Fibrotic scar, thickened scar N/A N/A Wound Margin: Large (67-100%) N/A N/A Granulation A mount: Red N/A N/A Granulation Quality: Small (1-33%) N/A N/A Necrotic A mount: Eschar N/A N/A Necrotic Tissue: Fat Layer (Subcutaneous Tissue): Yes N/A N/A Exposed Structures: Fascia: No Tendon: No Muscle: No Joint: No Bone: No Medium (34-66%) N/A N/A Epithelialization: Debridement - Selective/Open Wound N/A N/A Debridement: Pre-procedure Verification/Time Out 08:26 N/A N/A Taken: Lidocaine 4% Topical Solution N/A N/A Pain Control: Necrotic/Eschar N/A N/A Tissue Debrided: Non-Viable Tissue N/A N/A Level: 0.13 N/A N/A Debridement A (sq cm): rea Curette N/A  N/A Instrument: Minimum N/A N/A Bleeding: Pressure N/A N/A Hemostasis A chieved: Procedure was tolerated well N/A N/A Debridement Treatment Response: 0.4x0.4x0.1 N/A N/A Post Debridement Measurements L x W x D (cm) 0.013 N/A N/A Post Debridement Volume: (cm) Excoriation: No N/A N/A Periwound Skin Texture: Induration: No Callus: No Crepitus: No Rash: No Scarring: No Maceration: No N/A N/A Periwound Skin Moisture: Dry/Scaly: No Hemosiderin Staining: Yes N/A N/A Periwound Skin Color: Atrophie Blanche: No Cyanosis: No Ecchymosis: No Erythema: No Mottled: No Pallor: No Rubor: No No Abnormality N/A N/A Temperature: Debridement N/A N/A Procedures Performed: Treatment Notes Electronic Signature(s) Signed: 06/05/2023 8:34:19 AM By: Duanne Guess MD FACS Entered By: Duanne Guess on 06/05/2023 05:34:19 [name] [name] ([id number]) [id number] Page 4 of 7 -------------------------------------------------------------------------------- Multi-Disciplinary Care Plan Details Patient Name: Date of Service: [name] [name] 06/05/2023 8:15 A M Medical Record Number: [id number] Patient Account Number: [id number] Date of Birth/Sex: Treating RN: 02-Dec-1927 (89 y.o. [name] [name] Primary Care Yaira Bernardi: [name] [name] Other Clinician: Referring Velera Lansdale: Treating Willow Shidler/Extender: [name] [name] in Treatment: 5 Active Inactive Wound/Skin Impairment Nursing Diagnoses: Impaired tissue integrity Goals: Patient/caregiver will verbalize understanding of skin care regimen Date Initiated: 05/01/2023 Target Resolution Date: 07/25/2023 Goal Status: Active Interventions: Assess ulceration(s) every visit Treatment Activities: Skin care regimen initiated : 05/01/2023 Notes: Electronic Signature(s) Signed: 06/05/2023 11:32:03 AM By: [name] [name] Entered By: [name] [name] on 06/05/2023  05:36:39 -------------------------------------------------------------------------------- Pain Assessment Details Patient Name: Date of Service: [name] [name] 06/05/2023 8:15 A M Medical Record Number: [id number] Patient Account Number: [id number] Date of Birth/Sex: Treating RN: [birth year] (89 y.o. [name] [name] Primary Care Eithan Beagle: [name] [name] Other Clinician: Referring Alice Vitelli: Treating Jesica Goheen/Extender: [name] [name] in Treatment: 5 Active Problems Location of Pain Severity and Description of Pain Patient Has Paino No Site Locations Rate the pain. [name] [name], [name] [name] ([id number]) [id number] Page 5 of 7 Rate the pain. Current Pain Level: 0 Pain Management and Medication Current Pain Management: Electronic Signature(s) Signed: 06/05/2023 11:32:03 AM By: [name] [name] Entered By: [name] [name] on 06/05/2023 05:09:12 -------------------------------------------------------------------------------- Patient/Caregiver Education Details Patient Name: Date of Service: [name] [name] 10/11/2024andnbsp8:15 A M Medical Record Number: [id number] Patient Account Number: [id number] Date of Birth/Gender: Treating RN: 30-Jul-1928 (89 y.o. [name] [name] Primary Care Physician: [name] [name] Other Clinician: Referring Physician: Treating Physician/Extender: [name] [name] in Treatment: 5 Education Assessment Education Provided To: Patient Education Topics Provided Wound/Skin Impairment: Methods: Explain/Verbal Responses: Reinforcements needed,  State content correctly Electronic Signature(s) Signed: 06/05/2023 11:32:03 AM By: Gelene Mink By: [name] [name] on 06/05/2023 05:36:49 -------------------------------------------------------------------------------- Wound Assessment Details Patient Name: Date of Service: [name] [name]  06/05/2023 8:15 A M Medical Record Number: [id number] Patient Account Number: [id number] Date of Birth/Sex: Treating RN: [birth year] (89 y.o. [name] [name] Primary Care Lavinia Mcneely: [name] [name] Other Clinician: Referring Anthonie Lotito: Treating [name] [name]/Extender: [name] [name], [name] [name] ([id number]) [id number] Page 6 of 7 Weeks in Treatment: 5 Wound Status Wound Number: 4 Primary Etiology: Trauma, Other Wound Location: Right, Distal, Anterior Lower Leg Wound Status: Open Wounding Event: Hematoma Comorbid History: Cataracts, Anemia, Hypotension, Myocardial Infarction Date Acquired: 04/05/2023 Weeks Of Treatment: 5 Clustered Wound: No Photos Wound Measurements Length: (cm) 0.4 Width: (cm) 0.4 Depth: (cm) 0.1 Area: (cm) 0.126 Volume: (cm) 0.013 % Reduction in Area: 96.4% % Reduction in Volume: 96.2% Epithelialization: Medium (34-66%) Tunneling: No Undermining: No Wound Description Classification: Full Thickness Without Exposed Support Structures Wound Margin: Fibrotic scar, thickened scar Exudate Amount: Medium Exudate Type: Serosanguineous Exudate Color: red, brown Foul Odor After Cleansing: No Slough/Fibrino No Wound Bed Granulation Amount: Large (67-100%) Exposed Structure Granulation Quality: Red Fascia Exposed: No Necrotic Amount: Small (1-33%) Fat Layer (Subcutaneous Tissue) Exposed: Yes Necrotic Quality: Eschar Tendon Exposed: No Muscle Exposed: No Joint Exposed: No Bone Exposed: No Periwound Skin Texture Texture Color No Abnormalities Noted: No No Abnormalities Noted: No Callus: No Atrophie Blanche: No Crepitus: No Cyanosis: No Excoriation: No Ecchymosis: No Induration: No Erythema: No Rash: No Hemosiderin Staining: Yes Scarring: No Mottled: No Pallor: No Moisture Rubor: No No Abnormalities Noted: Yes Temperature / Pain Temperature: No Abnormality Treatment Notes Wound #4 (Lower  Leg) Wound Laterality: Right, Anterior, Distal Cleanser Soap and Water Discharge Instruction: May shower and wash wound with dial antibacterial soap and water prior to dressing change. Vashe 5.8 (oz) Discharge Instruction: Cleanse the wound with Vashe prior to applying a clean dressing using gauze sponges, not tissue or cotton balls. Peri-Wound Care Sween Lotion (Moisturizing lotion) [name] [name], [name] [name] ([id number]) [id number] Page 7 of 7 Discharge Instruction: Apply moisturizing lotion as directed Topical Gentamicin Discharge Instruction: As directed by physician Mupirocin Ointment Discharge Instruction: Apply Mupirocin (Bactroban) as instructed Primary Dressing PolyMem Silver Non-Adhesive Dressing, 4.25x4.25 in Discharge Instruction: Apply to wound bed as instructed Secondary Dressing Woven Gauze Sponge, Non-Sterile 4x4 in Discharge Instruction: Apply over primary dressing as directed. Secured With Transpore Surgical Tape, 2x10 (in/yd) Discharge Instruction: Secure dressing with tape as directed. Compression Wrap Kerlix Roll 4.5x3.1 (in/yd) Discharge Instruction: Apply Kerlix and Coban compression as directed. Coban Self-Adherent Wrap 4x5 (in/yd) Discharge Instruction: Apply over Kerlix as directed. Tubular netting #5 Compression Stockings Add-Ons Electronic Signature(s) Signed: 06/05/2023 11:32:03 AM By: [name] [name] Entered By: [name] [name] on 06/05/2023 05:19:17 -------------------------------------------------------------------------------- Vitals Details Patient Name: Date of Service: [name] [name] 06/05/2023 8:15 A M Medical Record Number: [id number] Patient Account Number: [id number] Date of Birth/Sex: Treating RN: 06-22-1928 (89 y.o. [name] [name] Primary Care Elanora Quin: [name] [name] Other Clinician: Referring Kaitlan Bin: Treating Saber Dickerman/Extender: [name] [name] in Treatment:  5 Vital Signs Time Taken: 08:12 Temperature (F): 98.2 Height (in): 62 Pulse (bpm): 87 Weight (lbs): 85 Respiratory Rate (breaths/min): 16 Body Mass Index (BMI): 15.5 Blood Pressure (mmHg): 148/61 Reference Range: 80 - 120 mg / dl Electronic Signature(s) Signed: 06/05/2023 11:32:03 AM By: [name] [name] Entered By: [name] [name] on 06/05/2023 05:12:59

## 2023-06-12 ENCOUNTER — Encounter (HOSPITAL_BASED_OUTPATIENT_CLINIC_OR_DEPARTMENT_OTHER): Payer: Medicare Other | Admitting: General Surgery

## 2023-06-12 DIAGNOSIS — L97812 Non-pressure chronic ulcer of other part of right lower leg with fat layer exposed: Secondary | ICD-10-CM | POA: Diagnosis not present

## 2023-06-12 DIAGNOSIS — J449 Chronic obstructive pulmonary disease, unspecified: Secondary | ICD-10-CM | POA: Diagnosis not present

## 2023-06-12 DIAGNOSIS — I872 Venous insufficiency (chronic) (peripheral): Secondary | ICD-10-CM | POA: Diagnosis not present

## 2023-06-12 DIAGNOSIS — I779 Disorder of arteries and arterioles, unspecified: Secondary | ICD-10-CM | POA: Diagnosis not present

## 2023-06-12 DIAGNOSIS — S81801A Unspecified open wound, right lower leg, initial encounter: Secondary | ICD-10-CM | POA: Diagnosis not present

## 2023-06-12 DIAGNOSIS — Z87891 Personal history of nicotine dependence: Secondary | ICD-10-CM | POA: Diagnosis not present

## 2023-06-12 DIAGNOSIS — L97822 Non-pressure chronic ulcer of other part of left lower leg with fat layer exposed: Secondary | ICD-10-CM | POA: Diagnosis not present

## 2023-06-12 DIAGNOSIS — E785 Hyperlipidemia, unspecified: Secondary | ICD-10-CM | POA: Diagnosis not present

## 2023-06-12 DIAGNOSIS — I10 Essential (primary) hypertension: Secondary | ICD-10-CM | POA: Diagnosis not present

## 2023-06-12 DIAGNOSIS — D649 Anemia, unspecified: Secondary | ICD-10-CM | POA: Diagnosis not present

## 2023-06-12 DIAGNOSIS — E039 Hypothyroidism, unspecified: Secondary | ICD-10-CM | POA: Diagnosis not present

## 2023-06-12 DIAGNOSIS — I87311 Chronic venous hypertension (idiopathic) with ulcer of right lower extremity: Secondary | ICD-10-CM | POA: Diagnosis not present

## 2023-06-12 NOTE — Progress Notes (Signed)
[name], [name] ([id number]) [id number] Page 1 of 8 Visit Report for 06/12/2023 Arrival Information Details Patient Name: Date of Service: [name] 06/12/2023 8:15 A M Medical Record Number: [id number] Patient Account Number: [id number] Date of Birth/Sex: Treating RN: 06/30/1928 (89 y.o. [name] Primary Care [name]: [name] Other Clinician: Referring [name]: Treating [name]: [name] in Treatment: 6 Visit Information History Since Last Visit Added or deleted any medications: No Patient Arrived: Wheel Chair Any new allergies or adverse reactions: No Arrival Time: 08:00 Had a fall or experienced change in No Accompanied By: daughter activities of daily living that may affect Transfer Assistance: Manual risk of falls: Patient Identification Verified: Yes Signs or symptoms of abuse/neglect since last visito No Secondary Verification Process Completed: Yes Hospitalized since last visit: No Patient Has Alerts: Yes Implantable device outside of the clinic excluding No Patient Alerts: R ABI [place] cellular tissue based products placed in the center since last visit: Has Dressing in Place as Prescribed: Yes Has Compression in Place as Prescribed: Yes Pain Present Now: No Electronic Signature(s) Signed: 06/12/2023 11:28:30 AM By: [name] Entered By: [name] on 06/12/2023 05:04:10 -------------------------------------------------------------------------------- Encounter Discharge Information Details Patient Name: Date of Service: [name] 06/12/2023 8:15 A M Medical Record Number: [id number] Patient Account Number: [id number] Date of Birth/Sex: Treating RN: 10-May-1928 (89 y.o. [name] Primary Care Ellery Tash: [name] Other Clinician: Referring [name]: Treating [name]/Extender: [name]  in Treatment: 6 Encounter Discharge Information Items Post Procedure Vitals Discharge Condition: Stable Temperature (F): 98.1 Ambulatory Status: Wheelchair Pulse (bpm): 82 Discharge Destination: Home Respiratory Rate (breaths/min): 16 Transportation: Private Auto Blood Pressure (mmHg): 149/80 Accompanied By: daughter Schedule Follow-up Appointment: Yes Clinical Summary of Care: Patient Declined Electronic Signature(s) Signed: 06/12/2023 11:28:30 AM By: [name] Entered By: [name] on 06/12/2023 05:25:48 [name] ([id number]) [id number] Page 2 of 8 -------------------------------------------------------------------------------- Lower Extremity Assessment Details Patient Name: Date of Service: [name] 06/12/2023 8:15 A M Medical Record Number: [id number] Patient Account Number: [id number] Date of Birth/Sex: Treating RN: [birth year] (89 y.o. [name] Primary Care Eren Puebla: [name] Other Clinician: Referring Alanee Ting: Treating [name]: [name] in Treatment: 6 Edema Assessment Assessed: [Left: No] [Right: No] [Left: Edema] [Right: :] Calf Left: Right: Point of Measurement: 31 cm From Medial Instep 27.5 cm 26 cm Ankle Left: Right: Point of Measurement: 8 cm From Medial Instep 19 cm 19 cm Vascular Assessment Pulses: Dorsalis Pedis Palpable: [Right:Yes] Extremity colors, hair growth, and conditions: Extremity Color: [Right:Pale] Hair Growth on Extremity: [Right:No] Temperature of Extremity: [Right:Warm] Capillary Refill: [Right:> 3 seconds] Dependent Rubor: [Right:No No] Electronic Signature(s) Signed: 06/12/2023 11:28:30 AM By: [name] Entered By: [name] on 06/12/2023 05:06:44 -------------------------------------------------------------------------------- Multi Wound Chart Details Patient Name: Date of Service: Lindsey  [name] 06/12/2023 8:15 A M Medical Record Number: [id number] Patient Account Number: [id number] Date of Birth/Sex: Treating RN: February 25, 1928 (88 y.o. F) Primary Care [name]: [name] Other Clinician: Referring [name]: Treating [name]: [name] in Treatment: 6 Vital Signs Height(in): 62 Pulse(bpm): 82 Weight(lbs): 85 Blood Pressure(mmHg): 149/80 Body Mass Index(BMI): 15.5 Temperature(F): 98.1 Respiratory Rate(breaths/min): 16 [4:Photos:] [N/A:N/A] Right, Distal, Anterior Lower Leg N/A N/A Wound Location: Hematoma N/A N/A Wounding Event: Trauma, Other N/A N/A Primary Etiology: Cataracts, Anemia, Hypotension, N/A N/A Comorbid History: Myocardial Infarction 04/05/2023 N/A N/A Date Acquired: 6 N/A N/A Weeks of Treatment: Open N/A  A M Medical Record Number: [id number] Patient Account Number: [id number] Date of Birth/Sex: Treating RN: 10/30/1927 (89 y.o. [name] Primary Care [name]: [name] Other Clinician: Referring [name]: Treating [name]: [name] in Treatment: 6 Active Problems Location of Pain Severity and Description of Pain Patient Has Paino No Site Locations Rate the pain. Current Pain Level: 0 Pain  Management and Medication Current Pain Management: Electronic Signature(s) Signed: 06/12/2023 11:28:30 AM By: [name] Entered By: [name] on 06/12/2023 05:04:25 -------------------------------------------------------------------------------- Patient/Caregiver Education Details Patient Name: Date of Service: [name] 10/18/2024andnbsp8:15 A M Medical Record Number: [id number] Patient Account Number: [id number] Date of Birth/Gender: Treating RN: 04-24-1928 (89 y.o. [name] Primary Care Physician: [name] Other Clinician: Referring Physician: Treating Physician/Extender: [name] in Treatment: 6 Education Assessment Education Provided To: Patient [name], [name] ([id number]) [id number] Page 6 of 8 Education Topics Provided Wound/Skin Impairment: Methods: Explain/Verbal Responses: Reinforcements needed, State content correctly Electronic Signature(s) Signed: 06/12/2023 11:28:30 AM By: [name] Entered By: [name] on 06/12/2023 05:13:53 -------------------------------------------------------------------------------- Wound Assessment Details Patient Name: Date of Service: [name] 06/12/2023 8:15 A M Medical Record Number: [id number] Patient Account Number: [id number] Date of Birth/Sex: Treating RN: 12-07-1927 (89 y.o. [name] Primary Care Cherylyn Sundby: [name] Other Clinician: Referring Ta Fair: Treating Athel Merriweather/Extender: [name] in Treatment: 6 Wound Status Wound Number: 4 Primary Etiology: Trauma, Other Wound Location: Right, Distal, Anterior Lower Leg Wound Status: Open Wounding Event: Hematoma Comorbid History: Cataracts, Anemia, Hypotension, Myocardial Infarction Date Acquired: 04/05/2023 Weeks Of Treatment: 6 Clustered Wound: No Photos Wound Measurements Length:  (cm) 0.2 Width: (cm) 0.2 Depth: (cm) 0.1 Area: (cm) 0.031 Volume: (cm) 0.003 % Reduction in Area: 99.1% % Reduction in Volume: 99.1% Epithelialization: Large (67-100%) Tunneling: No Undermining: No Wound Description Classification: Full Thickness Without Exposed Suppor Wound Margin: Fibrotic scar, thickened scar Exudate Amount: Medium Exudate Type: Serosanguineous Exudate Color: red, brown t Structures Foul Odor After Cleansing: No Slough/Fibrino No Wound Bed Granulation Amount: Large (67-100%) Exposed Structure Granulation Quality: Red Fascia Exposed: No Necrotic Amount: None Present (0%) Fat Layer (Subcutaneous Tissue) Exposed: Yes Tendon Exposed: No Muscle Exposed: No Joint Exposed: No Bone Exposed: No [name], [name] ([id number]) [id number] Page 7 of 8 Periwound Skin Texture Texture Color No Abnormalities Noted: No No Abnormalities Noted: No Callus: No Atrophie Blanche: No Crepitus: No Cyanosis: No Excoriation: No Ecchymosis: No Induration: No Erythema: No Rash: No Hemosiderin Staining: Yes Scarring: No Mottled: No Pallor: No Moisture Rubor: No No Abnormalities Noted: Yes Temperature / Pain Temperature: No Abnormality Treatment Notes Wound #4 (Lower Leg) Wound Laterality: Right, Anterior, Distal Cleanser Soap and Water Discharge Instruction: May shower and wash wound with dial antibacterial soap and water prior to dressing change. Vashe 5.8 (oz) Discharge Instruction: Cleanse the wound with Vashe prior to applying a clean dressing using gauze sponges, not tissue or cotton balls. Peri-Wound Care Sween Lotion (Moisturizing lotion) Discharge Instruction: Apply moisturizing lotion as directed Topical Gentamicin Discharge Instruction: As directed by physician Mupirocin Ointment Discharge Instruction: Apply Mupirocin (Bactroban) as instructed Primary Dressing PolyMem Silver Non-Adhesive Dressing, 4.25x4.25 in Discharge  Instruction: Apply to wound bed as instructed Secondary Dressing Woven Gauze Sponge, Non-Sterile 4x4 in Discharge Instruction: Apply over primary dressing as directed. Secured With Transpore Surgical Tape, 2x10 (in/yd) Discharge Instruction: Secure dressing with tape as directed. Compression Wrap Kerlix Roll 4.5x3.1 (in/yd) Discharge Instruction: Apply Kerlix and Coban compression as directed. Coban  N/A Wound Status: No N/A N/A Wound Recurrence: 0.2x0.2x0.1 N/A N/A Measurements L x W x D (cm) 0.031 N/A N/A A (cm) : rea 0.003 N/A N/A Volume (cm) : 99.10% N/A N/A % Reduction in A rea: 99.10% N/A N/A % Reduction in Volume: Full Thickness Without Exposed N/A N/A Classification: Support Structures Medium N/A N/A Exudate A mount: Serosanguineous N/A N/A Exudate Type: red, brown N/A N/A Exudate Color: Fibrotic scar, thickened scar N/A N/A Wound Margin: Large (67-100%) N/A N/A Granulation A mount: Red N/A N/A Granulation Quality: None Present (0%) N/A N/A Necrotic A mount: Fat Layer (Subcutaneous Tissue): Yes N/A N/A Exposed Structures: Fascia: No Tendon: No Muscle: No Joint: No Bone: No Large (67-100%) N/A N/A Epithelialization: Debridement - Selective/Open Wound N/A N/A Debridement: Pre-procedure Verification/Time Out 08:14 N/A N/A Taken: Lidocaine 4% Topical Solution N/A N/A Pain Control: Necrotic/Eschar N/A N/A Tissue Debrided: Non-Viable Tissue N/A N/A Level: 0.03 N/A N/A Debridement A (sq cm): rea Curette N/A N/A Instrument: Minimum N/A  N/A Bleeding: Pressure N/A N/A Hemostasis A chieved: Procedure was tolerated [name] N/A N/A Debridement Treatment Response: 0.2x0.2x0.1 N/A N/A Post Debridement Measurements L x W x D (cm) 0.003 N/A N/A Post Debridement Volume: (cm) Excoriation: No N/A N/A Periwound Skin Texture: Induration: No Callus: No Crepitus: No Rash: No Scarring: No Maceration: No N/A N/A Periwound Skin Moisture: Dry/Scaly: No Hemosiderin Staining: Yes N/A N/A Periwound Skin Color: Atrophie Blanche: No Cyanosis: No Ecchymosis: No Erythema: No Mottled: No Pallor: No Rubor: No No Abnormality N/A N/A Temperature: Debridement N/A N/A Procedures Performed: Treatment Notes Wound #4 (Lower Leg) Wound Laterality: Right, Anterior, Distal Cleanser Soap and Water Discharge Instruction: May shower and wash wound with dial antibacterial soap and water prior to dressing change. Vashe 5.8 (oz) Discharge Instruction: Cleanse the wound with Vashe prior to applying a clean dressing using gauze sponges, not tissue or cotton balls. Peri-Wound Care Sween Lotion (Moisturizing lotion) Discharge Instruction: Apply moisturizing lotion as directed [name], [name] ([id number]) [id number].pdf Page 4 of 8 Topical Gentamicin Discharge Instruction: As directed by physician Mupirocin Ointment Discharge Instruction: Apply Mupirocin (Bactroban) as instructed Primary Dressing PolyMem Silver Non-Adhesive Dressing, 4.25x4.25 in Discharge Instruction: Apply to wound bed as instructed Secondary Dressing Woven Gauze Sponge, Non-Sterile 4x4 in Discharge Instruction: Apply over primary dressing as directed. Secured With Transpore Surgical Tape, 2x10 (in/yd) Discharge Instruction: Secure dressing with tape as directed. Compression Wrap Kerlix Roll 4.5x3.1 (in/yd) Discharge Instruction: Apply Kerlix and Coban compression as directed. Coban Self-Adherent Wrap 4x5 (in/yd) Discharge Instruction: Apply over  Kerlix as directed. Tubular netting #5 Compression Stockings Add-Ons Electronic Signature(s) Signed: 06/12/2023 8:26:47 AM By: Duanne Guess MD FACS Entered By: Duanne Guess on 06/12/2023 05:26:47 -------------------------------------------------------------------------------- Multi-Disciplinary Care Plan Details Patient Name: Date of Service: [name] 06/12/2023 8:15 A M Medical Record Number: [id number] Patient Account Number: [id number] Date of Birth/Sex: Treating RN: Jun 07, 1928 (89 y.o. [name] Primary Care Lizvet Chunn: [name] Other Clinician: Referring Takera Rayl: Treating [place] Lapaglia/Extender: [name] in Treatment: 6 Active Inactive Wound/Skin Impairment Nursing Diagnoses: Impaired tissue integrity Goals: Patient/caregiver will verbalize understanding of skin care regimen Date Initiated: 05/01/2023 Target Resolution Date: 07/25/2023 Goal Status: Active Interventions: Assess ulceration(s) every visit Treatment Activities: Skin care regimen initiated : 05/01/2023 Notes: BRANTLEIGH, SEDLAR ([id number]) [id number] Page 5 of 8 Electronic Signature(s) Signed: 06/12/2023 11:28:30 AM By: [name] Entered By: [name] on 06/12/2023 05:13:43 -------------------------------------------------------------------------------- Pain Assessment Details Patient Name: Date of Service: [name], Lindsey Clifton James H. 06/12/2023 8:15  [name], [name] ([id number]) [id number] Page 1 of 8 Visit Report for 06/12/2023 Arrival Information Details Patient Name: Date of Service: [name] 06/12/2023 8:15 A M Medical Record Number: [id number] Patient Account Number: [id number] Date of Birth/Sex: Treating RN: 06/30/1928 (89 y.o. [name] Primary Care [name]: [name] Other Clinician: Referring [name]: Treating [name]: [name] in Treatment: 6 Visit Information History Since Last Visit Added or deleted any medications: No Patient Arrived: Wheel Chair Any new allergies or adverse reactions: No Arrival Time: 08:00 Had a fall or experienced change in No Accompanied By: daughter activities of daily living that may affect Transfer Assistance: Manual risk of falls: Patient Identification Verified: Yes Signs or symptoms of abuse/neglect since last visito No Secondary Verification Process Completed: Yes Hospitalized since last visit: No Patient Has Alerts: Yes Implantable device outside of the clinic excluding No Patient Alerts: R ABI [place] cellular tissue based products placed in the center since last visit: Has Dressing in Place as Prescribed: Yes Has Compression in Place as Prescribed: Yes Pain Present Now: No Electronic Signature(s) Signed: 06/12/2023 11:28:30 AM By: [name] Entered By: [name] on 06/12/2023 05:04:10 -------------------------------------------------------------------------------- Encounter Discharge Information Details Patient Name: Date of Service: [name] 06/12/2023 8:15 A M Medical Record Number: [id number] Patient Account Number: [id number] Date of Birth/Sex: Treating RN: 10-May-1928 (89 y.o. [name] Primary Care Ellery Tash: [name] Other Clinician: Referring [name]: Treating [name]/Extender: [name]  in Treatment: 6 Encounter Discharge Information Items Post Procedure Vitals Discharge Condition: Stable Temperature (F): 98.1 Ambulatory Status: Wheelchair Pulse (bpm): 82 Discharge Destination: Home Respiratory Rate (breaths/min): 16 Transportation: Private Auto Blood Pressure (mmHg): 149/80 Accompanied By: daughter Schedule Follow-up Appointment: Yes Clinical Summary of Care: Patient Declined Electronic Signature(s) Signed: 06/12/2023 11:28:30 AM By: [name] Entered By: [name] on 06/12/2023 05:25:48 [name] ([id number]) [id number] Page 2 of 8 -------------------------------------------------------------------------------- Lower Extremity Assessment Details Patient Name: Date of Service: [name] 06/12/2023 8:15 A M Medical Record Number: [id number] Patient Account Number: [id number] Date of Birth/Sex: Treating RN: [birth year] (89 y.o. [name] Primary Care Eren Puebla: [name] Other Clinician: Referring Alanee Ting: Treating [name]: [name] in Treatment: 6 Edema Assessment Assessed: [Left: No] [Right: No] [Left: Edema] [Right: :] Calf Left: Right: Point of Measurement: 31 cm From Medial Instep 27.5 cm 26 cm Ankle Left: Right: Point of Measurement: 8 cm From Medial Instep 19 cm 19 cm Vascular Assessment Pulses: Dorsalis Pedis Palpable: [Right:Yes] Extremity colors, hair growth, and conditions: Extremity Color: [Right:Pale] Hair Growth on Extremity: [Right:No] Temperature of Extremity: [Right:Warm] Capillary Refill: [Right:> 3 seconds] Dependent Rubor: [Right:No No] Electronic Signature(s) Signed: 06/12/2023 11:28:30 AM By: [name] Entered By: [name] on 06/12/2023 05:06:44 -------------------------------------------------------------------------------- Multi Wound Chart Details Patient Name: Date of Service: Lindsey  [name] 06/12/2023 8:15 A M Medical Record Number: [id number] Patient Account Number: [id number] Date of Birth/Sex: Treating RN: February 25, 1928 (88 y.o. F) Primary Care [name]: [name] Other Clinician: Referring [name]: Treating [name]: [name] in Treatment: 6 Vital Signs Height(in): 62 Pulse(bpm): 82 Weight(lbs): 85 Blood Pressure(mmHg): 149/80 Body Mass Index(BMI): 15.5 Temperature(F): 98.1 Respiratory Rate(breaths/min): 16 [4:Photos:] [N/A:N/A] Right, Distal, Anterior Lower Leg N/A N/A Wound Location: Hematoma N/A N/A Wounding Event: Trauma, Other N/A N/A Primary Etiology: Cataracts, Anemia, Hypotension, N/A N/A Comorbid History: Myocardial Infarction 04/05/2023 N/A N/A Date Acquired: 6 N/A N/A Weeks of Treatment: Open N/A

## 2023-06-12 NOTE — Progress Notes (Signed)
MAILI, MACCHIO ([id number]) [id number] Page 1 of 10 Visit Report for 06/12/2023 Chief Complaint Document Details Patient Name: Date of Service: [name] 06/12/2023 8:15 A M Medical Record Number: [id number] Patient Account Number: [id number] Date of Birth/Sex: Treating RN: February 16, 1928 (89 y.o. F) Primary Care Provider: Seabron Spates Other Clinician: Referring Provider: Treating Provider/Extender: Verner Mould in Treatment: 6 Information Obtained from: Patient Chief Complaint 07/26/2018; patient is here for review of laceration injuries on the left anterior tibia area 05/01/2023; patient presents for right lower extremity wounds in the setting of trauma Electronic Signature(s) Signed: 06/12/2023 8:26:53 AM By: Duanne Guess MD FACS Entered By: Duanne Guess on 06/12/2023 05:26:53 -------------------------------------------------------------------------------- Debridement Details Patient Name: Date of Service: [name] 06/12/2023 8:15 A M Medical Record Number: [id number] Patient Account Number: [id number] Date of Birth/Sex: Treating RN: [birth year] (89 y.o. [name] Primary Care Provider: Seabron Spates Other Clinician: Referring Provider: Treating Provider/Extender: Verner Mould in Treatment: 6 Debridement Performed for Assessment: Wound #4 Right,Distal,Anterior Lower Leg Performed By: Physician Duanne Guess, MD The following information was scribed by: Samuella Bruin The information was scribed for: Duanne Guess Debridement Type: Debridement Level of Consciousness (Pre-procedure): Awake and Alert Pre-procedure Verification/Time Out Yes - 08:14 Taken: Start Time: 08:14 Pain Control: Lidocaine 4% Topical Solution Percent of Wound Bed Debrided: 100% T Area Debrided (cm): otal 0.03 Tissue and other material debrided: Non-Viable,  Eschar Level: Non-Viable Tissue Debridement Description: Selective/Open Wound Instrument: Curette Bleeding: Minimum Hemostasis Achieved: Pressure Response to Treatment: Procedure was tolerated well Level of Consciousness (Post- Awake and Alert procedure): Post Debridement Measurements of Total Wound Length: (cm) 0.2 Width: (cm) 0.2 Depth: (cm) 0.1 Volume: (cm) 0.003 Character of Wound/Ulcer Post Debridement: Improved EVERLIE, VOELZ ([id number]) [id number] Page 2 of 10 Post Procedure Diagnosis Same as Pre-procedure Electronic Signature(s) Signed: 06/12/2023 8:40:33 AM By: Duanne Guess MD FACS Signed: 06/12/2023 11:28:30 AM By: Gelene Mink By: Samuella Bruin on 06/12/2023 05:14:55 -------------------------------------------------------------------------------- HPI Details Patient Name: Date of Service: [name] 06/12/2023 8:15 A M Medical Record Number: [id number] Patient Account Number: [id number] Date of Birth/Sex: Treating RN: [birth year] (89 y.o. F) Primary Care Provider: Seabron Spates Other Clinician: Referring Provider: Treating Provider/Extender: Verner Mould in Treatment: 6 History of Present Illness HPI Description: ADMISSION 07/26/2018 This is an 89 year old woman who lives independently in her own apartment. 3 weeks ago she suffered a fall traumatizing her anterior lower legs against a small table. She developed laceration injuries on the left and right tibial areas. The area on the right has since healed. She has been seeing her primary physician at [facility]. She has had a round of Keflex and more recently a round of Levaquin for periwound cellulitis. According to her daughter she seemed to respond better to the Levaquin. I am not aware of any cultures. They have been using antibiotic cream, T and gauze to dress the wounds. As mentioned an area on elfa the right anterior  tibia just below the tibial tuberosity has healed. Past medical history; the patient is not a diabetic nor does she have any history of PAD. She has a history of anemia, depression, gastroesophageal reflux disease, bilateral hearing loss, hyperlipidemia, hypothyroidism, coronary artery artery disease osteoporosis and hypothyroidism she has a history of renal cell carcinoma the exact status of this is uncertain In our clinic her ABIs were very poor at 0.33 and 0.25. Her nurse reported that the waveforms  with just a small open portion with some slough and eschar present. 05/29/2023: The skin tear on her left anterior leg has healed. The remaining wound on her right lower leg is about half the size that it was last week. There is a small amount of eschar around the edges and some slough on the surface. 06/05/2023: The right lower leg wound continues to contract. There is good granulation tissue on the surface with a little bit of dried eschar around the edges. 06/12/2023: The wound is down to just a couple of millimeters and is very superficial with a layer of eschar over the top. NATSUMI, CLINT ([id number]) [id number] Page 3 of 10 Electronic Signature(s) Signed: 06/12/2023 8:27:42 AM By: Duanne Guess MD FACS Entered By: Duanne Guess on 06/12/2023 05:27:42 -------------------------------------------------------------------------------- Physical Exam Details Patient Name: Date of Service: [name] 06/12/2023 8:15 A M Medical Record Number: [id number] Patient Account Number: [id number] Date of Birth/Sex: Treating RN: 12/05/1927 (89 y.o. F) Primary Care Provider: Seabron Spates Other Clinician: Referring Provider: Treating Provider/Extender: Verner Mould in Treatment: 6 Constitutional Slightly hypertensive. . . . no acute distress. Respiratory Normal work of breathing on room air. Notes 06/12/2023: The wound is down to just a couple of millimeters and is very superficial with a layer of eschar over the top. Electronic Signature(s) Signed: 06/12/2023 8:28:11 AM By: Duanne Guess MD FACS Entered By: Duanne Guess on 06/12/2023  05:28:11 -------------------------------------------------------------------------------- Physician Orders Details Patient Name: Date of Service: [name] 06/12/2023 8:15 A M Medical Record Number: [id number] Patient Account Number: [id number] Date of Birth/Sex: Treating RN: 1928-04-01 (89 y.o. [name] Primary Care Provider: Seabron Spates Other Clinician: Referring Provider: Treating Provider/Extender: Verner Mould in Treatment: 6 The following information was scribed by: Samuella Bruin The information was scribed for: Duanne Guess Verbal / Phone Orders: No Diagnosis Coding ICD-10 Coding Code Description [phone] Non-pressure chronic ulcer of other part of right lower leg with fat layer exposed I87.311 Chronic venous hypertension (idiopathic) with ulcer of right lower extremity J44.9 Chronic obstructive pulmonary disease, unspecified T79.8XXD Other early complications of trauma, subsequent encounter Follow-up Appointments ppointment in 1 week. - Dr. Lady Gary Room 2 Return A Anesthetic (In clinic) Topical Lidocaine 4% applied to wound bed Bathing/ Shower/ Hygiene May shower with protection but do not get wound dressing(s) wet. Protect dressing(s) with water repellant cover (for example, large plastic bag) or a cast cover and may then take shower. - Please cover right leg if showering. Please keep the right leg wraps dry ALEYLA, SAMAND ([id number]) [phone].pdf Page 4 of 10 Edema Control - Lymphedema / SCD / Other Right Lower Extremity Elevate legs to the level of the heart or above for 30 minutes daily and/or when sitting for 3-4 times a day throughout the day. Avoid standing for long periods of time. Exercise regularly - As tolerated Wound Treatment Wound #4 - Lower Leg Wound Laterality: Right, Anterior, Distal Cleanser: Soap and Water 1 x Per Week/30 Days Discharge Instructions: May  shower and wash wound with dial antibacterial soap and water prior to dressing change. Cleanser: Vashe 5.8 (oz) 1 x Per Week/30 Days Discharge Instructions: Cleanse the wound with Vashe prior to applying a clean dressing using gauze sponges, not tissue or cotton balls. Peri-Wound Care: Sween Lotion (Moisturizing lotion) 1 x Per Week/30 Days Discharge Instructions: Apply moisturizing lotion as directed Topical: Gentamicin 1 x Per Week/30 Days Discharge Instructions: As directed by physician Topical: Mupirocin  [id number] Patient Account Number: [id number] Date of Birth/Sex: Treating RN: [birth year] (89 y.o. F) Primary Care Provider: Seabron Spates Other Clinician: Referring Provider: Treating Provider/Extender: Verner Mould in Treatment: 6 Information Obtained From Patient Chart Constitutional Symptoms (General Health) Medical History: Past Medical History Notes: almost bedridden per dgtr - difficult to stand up and sit down safely Eyes Medical History: Positive for: Cataracts - removed X2 Ear/Nose/Mouth/Throat Medical History: Past Medical History Notes: totally deaf on left ; partial hearing with hearing aide on the left Hematologic/Lymphatic Medical History: Positive for: Anemia - s/p heart surgery Cardiovascular Medical History: Positive for: Hypotension; Myocardial Infarction Past Medical History Notes: atherosclerosis Endocrine Medical History: Past Medical History  Notes: hypothyroid Integumentary (Skin) Medical History: Negative for: History of Burn Past Medical History NotesCHARLEN, NEMEROFF ([id number]) [id number] Page 9 of 10 wound of right Lower leg Musculoskeletal Medical History: Negative for: Gout; Rheumatoid Arthritis; Osteoarthritis; Osteomyelitis Oncologic Medical History: Negative for: Received Chemotherapy; Received Radiation Past Medical History Notes: 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 (no chemo or radiation for this) Psychiatric Medical History: Past Medical History Notes: depression HBO Extended History Items Eyes: Cataracts Immunizations Pneumococcal Vaccine: Received Pneumococcal Vaccination: Yes Received Pneumococcal Vaccination On or After 60th Birthday: Yes Tetanus Vaccine: Last tetanus shot: 06/08/2016 Implantable Devices None Hospitalization / Surgery History Type of Hospitalization/Surgery renal mass ablation syncope Family and Social History Cancer: Yes - Siblings; Diabetes: No; Heart Disease: Yes - Mother; Hereditary Spherocytosis: No; Hypertension: Yes - Mother; Kidney Disease: Yes - Siblings; Lung Disease: No; Seizures: No; Stroke: No; Thyroid Problems: No; Tuberculosis: No; Former smoker - quit 60 years ago; Marital Status - Widowed; Alcohol Use: Never; Drug Use: No History; Caffeine Use: Daily - coffee; Financial Concerns: No; Food, Clothing or Shelter Needs: No; Support System Lacking: No; Transportation Concerns: No Electronic Signature(s) Signed: 06/12/2023 8:40:33 AM By: Duanne Guess MD FACS Entered By: Duanne Guess on 06/12/2023 05:27:47 -------------------------------------------------------------------------------- SuperBill Details Patient Name: Date of Service: [name] 06/12/2023 Medical Record Number: [id number] Patient Account Number: [id number] Date of Birth/Sex: Treating RN: 11-May-1928 (89 y.o. F) Primary Care  Provider: Seabron Spates Other Clinician: Referring Provider: Treating Provider/Extender: Verner Mould in Treatment: 6 Diagnosis Coding ICD-10 Codes Code Description [id number] Non-pressure chronic ulcer of other part of right lower leg with fat layer exposed I87.311 Chronic venous hypertension (idiopathic) with ulcer of right lower extremity J44.9 Chronic obstructive pulmonary disease, unspecified DALAYSIA, GRIERSON ([id number]) [id number] Page 10 of 10 T79.8XXD Other early complications of trauma, subsequent encounter Facility Procedures : CPT4 Code: [id number] Description: [phone] - DEBRIDE WOUND 1ST 20 SQ CM OR < ICD-10 Diagnosis Description L97.812 Non-pressure chronic ulcer of other part of right lower leg with fat layer expos Modifier: ed Quantity: 1 Physician Procedures : CPT4 Code Description Modifier [id number] - WC PHYS LEVEL 4 - EST PT 25 ICD-10 Diagnosis Description L97.812 Non-pressure chronic ulcer of other part of right lower leg with fat layer exposed I87.311 Chronic venous hypertension (idiopathic) with  ulcer of right lower extremity J44.9 Chronic obstructive pulmonary disease, unspecified T79.8XXD Other early complications of trauma, subsequent encounter Quantity: 1 : [id number] - WC PHYS DEBR WO ANESTH 20 SQ CM ICD-10 Diagnosis Description L97.812 Non-pressure chronic ulcer of other part of right lower leg with fat layer exposed Quantity: 1 Electronic Signature(s) Signed: 06/12/2023 8:30:15 AM By: Duanne Guess MD FACS Entered By: Duanne Guess on 06/12/2023 05:30:15  Ointment 1 x Per Week/30 Days Discharge Instructions: Apply Mupirocin (Bactroban) as instructed Prim Dressing: PolyMem Silver Non-Adhesive Dressing, 4.25x4.25 in 1 x Per Week/30 Days ary Discharge Instructions: Apply to wound bed as instructed Secondary Dressing: Woven Gauze Sponge, Non-Sterile 4x4 in 1 x Per Week/30 Days Discharge Instructions: Apply over primary dressing as directed. Secured With: Transpore Surgical Tape, 2x10 (in/yd) 1 x Per Week/30 Days Discharge Instructions: Secure dressing with tape as directed. Compression Wrap: Kerlix Roll 4.5x3.1 (in/yd) 1 x Per Week/30 Days Discharge Instructions: Apply Kerlix and Coban compression as directed. Compression Wrap: Coban Self-Adherent Wrap 4x5 (in/yd) 1 x Per Week/30 Days Discharge Instructions: Apply over Kerlix as directed. Compression Wrap: Tubular netting #5 1 x Per Week/30 Days Patient Medications llergies: trazodone, codeine, clarithromycin, diazepam, Sulfa (Sulfonamide Antibiotics), Prilosec A Notifications Medication Indication Start End 06/12/2023 lidocaine DOSE topical 4 % cream - cream topical Electronic Signature(s) Signed: 06/12/2023 8:40:33 AM By: Duanne Guess MD FACS Entered By: Duanne Guess on 06/12/2023 84:69:62 -------------------------------------------------------------------------------- Problem List Details Patient Name: Date of Service: [name] 06/12/2023 8:15 A M Medical Record  Number: [id number] Patient Account Number: [id number] Date of Birth/Sex: Treating RN: 08-21-1928 (89 y.o. F) Primary Care Provider: Seabron Spates Other Clinician: Referring Provider: Treating Provider/Extender: Verner Mould in Treatment: 6 Active Problems ICD-10 Encounter BRONWYN, UDOH ([id number]) [id number] Page 5 of 10 Encounter Code Description Active Date MDM Diagnosis L97.812 Non-pressure chronic ulcer of other part of right lower leg with fat layer 05/01/2023 No Yes exposed I87.311 Chronic venous hypertension (idiopathic) with ulcer of right lower extremity 05/01/2023 No Yes J44.9 Chronic obstructive pulmonary disease, unspecified 05/01/2023 No Yes T79.8XXD Other early complications of trauma, subsequent encounter 05/01/2023 No Yes Inactive Problems Resolved Problems ICD-10 Code Description Active Date Resolved Date L97.822 Non-pressure chronic ulcer of other part of left lower leg with fat layer exposed 05/22/2023 05/22/2023 Electronic Signature(s) Signed: 06/12/2023 8:26:40 AM By: Duanne Guess MD FACS Entered By: Duanne Guess on 06/12/2023 05:26:40 -------------------------------------------------------------------------------- Progress Note Details Patient Name: Date of Service: [name] 06/12/2023 8:15 A M Medical Record Number: [id number] Patient Account Number: [id number] Date of Birth/Sex: Treating RN: 23-Apr-1928 (89 y.o. F) Primary Care Provider: Seabron Spates Other Clinician: Referring Provider: Treating Provider/Extender: Verner Mould in Treatment: 6 Subjective Chief Complaint Information obtained from Patient 07/26/2018; patient is here for review of laceration injuries on the left anterior tibia area 05/01/2023; patient presents for right lower extremity wounds in the setting of trauma History of Present Illness (HPI) ADMISSION 07/26/2018 This is an  89 year old woman who lives independently in her own apartment. 3 weeks ago she suffered a fall traumatizing her anterior lower legs against a small table. She developed laceration injuries on the left and right tibial areas. The area on the right has since healed. She has been seeing her primary physician at [facility]. She has had a round of Keflex and more recently a round of Levaquin for periwound cellulitis. According to her daughter she seemed to respond better to the Levaquin. I am not aware of any cultures. They have been using antibiotic cream, T and gauze to dress the wounds. As mentioned an area on elfa the right anterior tibia just below the tibial tuberosity has healed. Past medical history; the patient is not a diabetic nor does she have any history of PAD. She has a history of anemia, depression, gastroesophageal reflux disease, bilateral hearing loss, hyperlipidemia, hypothyroidism, coronary artery artery disease osteoporosis  MAILI, MACCHIO ([id number]) [id number] Page 1 of 10 Visit Report for 06/12/2023 Chief Complaint Document Details Patient Name: Date of Service: [name] 06/12/2023 8:15 A M Medical Record Number: [id number] Patient Account Number: [id number] Date of Birth/Sex: Treating RN: February 16, 1928 (89 y.o. F) Primary Care Provider: Seabron Spates Other Clinician: Referring Provider: Treating Provider/Extender: Verner Mould in Treatment: 6 Information Obtained from: Patient Chief Complaint 07/26/2018; patient is here for review of laceration injuries on the left anterior tibia area 05/01/2023; patient presents for right lower extremity wounds in the setting of trauma Electronic Signature(s) Signed: 06/12/2023 8:26:53 AM By: Duanne Guess MD FACS Entered By: Duanne Guess on 06/12/2023 05:26:53 -------------------------------------------------------------------------------- Debridement Details Patient Name: Date of Service: [name] 06/12/2023 8:15 A M Medical Record Number: [id number] Patient Account Number: [id number] Date of Birth/Sex: Treating RN: [birth year] (89 y.o. [name] Primary Care Provider: Seabron Spates Other Clinician: Referring Provider: Treating Provider/Extender: Verner Mould in Treatment: 6 Debridement Performed for Assessment: Wound #4 Right,Distal,Anterior Lower Leg Performed By: Physician Duanne Guess, MD The following information was scribed by: Samuella Bruin The information was scribed for: Duanne Guess Debridement Type: Debridement Level of Consciousness (Pre-procedure): Awake and Alert Pre-procedure Verification/Time Out Yes - 08:14 Taken: Start Time: 08:14 Pain Control: Lidocaine 4% Topical Solution Percent of Wound Bed Debrided: 100% T Area Debrided (cm): otal 0.03 Tissue and other material debrided: Non-Viable,  Eschar Level: Non-Viable Tissue Debridement Description: Selective/Open Wound Instrument: Curette Bleeding: Minimum Hemostasis Achieved: Pressure Response to Treatment: Procedure was tolerated well Level of Consciousness (Post- Awake and Alert procedure): Post Debridement Measurements of Total Wound Length: (cm) 0.2 Width: (cm) 0.2 Depth: (cm) 0.1 Volume: (cm) 0.003 Character of Wound/Ulcer Post Debridement: Improved EVERLIE, VOELZ ([id number]) [id number] Page 2 of 10 Post Procedure Diagnosis Same as Pre-procedure Electronic Signature(s) Signed: 06/12/2023 8:40:33 AM By: Duanne Guess MD FACS Signed: 06/12/2023 11:28:30 AM By: Gelene Mink By: Samuella Bruin on 06/12/2023 05:14:55 -------------------------------------------------------------------------------- HPI Details Patient Name: Date of Service: [name] 06/12/2023 8:15 A M Medical Record Number: [id number] Patient Account Number: [id number] Date of Birth/Sex: Treating RN: [birth year] (89 y.o. F) Primary Care Provider: Seabron Spates Other Clinician: Referring Provider: Treating Provider/Extender: Verner Mould in Treatment: 6 History of Present Illness HPI Description: ADMISSION 07/26/2018 This is an 89 year old woman who lives independently in her own apartment. 3 weeks ago she suffered a fall traumatizing her anterior lower legs against a small table. She developed laceration injuries on the left and right tibial areas. The area on the right has since healed. She has been seeing her primary physician at [facility]. She has had a round of Keflex and more recently a round of Levaquin for periwound cellulitis. According to her daughter she seemed to respond better to the Levaquin. I am not aware of any cultures. They have been using antibiotic cream, T and gauze to dress the wounds. As mentioned an area on elfa the right anterior  tibia just below the tibial tuberosity has healed. Past medical history; the patient is not a diabetic nor does she have any history of PAD. She has a history of anemia, depression, gastroesophageal reflux disease, bilateral hearing loss, hyperlipidemia, hypothyroidism, coronary artery artery disease osteoporosis and hypothyroidism she has a history of renal cell carcinoma the exact status of this is uncertain In our clinic her ABIs were very poor at 0.33 and 0.25. Her nurse reported that the waveforms  Ointment 1 x Per Week/30 Days Discharge Instructions: Apply Mupirocin (Bactroban) as instructed Prim Dressing: PolyMem Silver Non-Adhesive Dressing, 4.25x4.25 in 1 x Per Week/30 Days ary Discharge Instructions: Apply to wound bed as instructed Secondary Dressing: Woven Gauze Sponge, Non-Sterile 4x4 in 1 x Per Week/30 Days Discharge Instructions: Apply over primary dressing as directed. Secured With: Transpore Surgical Tape, 2x10 (in/yd) 1 x Per Week/30 Days Discharge Instructions: Secure dressing with tape as directed. Compression Wrap: Kerlix Roll 4.5x3.1 (in/yd) 1 x Per Week/30 Days Discharge Instructions: Apply Kerlix and Coban compression as directed. Compression Wrap: Coban Self-Adherent Wrap 4x5 (in/yd) 1 x Per Week/30 Days Discharge Instructions: Apply over Kerlix as directed. Compression Wrap: Tubular netting #5 1 x Per Week/30 Days Patient Medications llergies: trazodone, codeine, clarithromycin, diazepam, Sulfa (Sulfonamide Antibiotics), Prilosec A Notifications Medication Indication Start End 06/12/2023 lidocaine DOSE topical 4 % cream - cream topical Electronic Signature(s) Signed: 06/12/2023 8:40:33 AM By: Duanne Guess MD FACS Entered By: Duanne Guess on 06/12/2023 84:69:62 -------------------------------------------------------------------------------- Problem List Details Patient Name: Date of Service: [name] 06/12/2023 8:15 A M Medical Record  Number: [id number] Patient Account Number: [id number] Date of Birth/Sex: Treating RN: 08-21-1928 (89 y.o. F) Primary Care Provider: Seabron Spates Other Clinician: Referring Provider: Treating Provider/Extender: Verner Mould in Treatment: 6 Active Problems ICD-10 Encounter BRONWYN, UDOH ([id number]) [id number] Page 5 of 10 Encounter Code Description Active Date MDM Diagnosis L97.812 Non-pressure chronic ulcer of other part of right lower leg with fat layer 05/01/2023 No Yes exposed I87.311 Chronic venous hypertension (idiopathic) with ulcer of right lower extremity 05/01/2023 No Yes J44.9 Chronic obstructive pulmonary disease, unspecified 05/01/2023 No Yes T79.8XXD Other early complications of trauma, subsequent encounter 05/01/2023 No Yes Inactive Problems Resolved Problems ICD-10 Code Description Active Date Resolved Date L97.822 Non-pressure chronic ulcer of other part of left lower leg with fat layer exposed 05/22/2023 05/22/2023 Electronic Signature(s) Signed: 06/12/2023 8:26:40 AM By: Duanne Guess MD FACS Entered By: Duanne Guess on 06/12/2023 05:26:40 -------------------------------------------------------------------------------- Progress Note Details Patient Name: Date of Service: [name] 06/12/2023 8:15 A M Medical Record Number: [id number] Patient Account Number: [id number] Date of Birth/Sex: Treating RN: 23-Apr-1928 (89 y.o. F) Primary Care Provider: Seabron Spates Other Clinician: Referring Provider: Treating Provider/Extender: Verner Mould in Treatment: 6 Subjective Chief Complaint Information obtained from Patient 07/26/2018; patient is here for review of laceration injuries on the left anterior tibia area 05/01/2023; patient presents for right lower extremity wounds in the setting of trauma History of Present Illness (HPI) ADMISSION 07/26/2018 This is an  89 year old woman who lives independently in her own apartment. 3 weeks ago she suffered a fall traumatizing her anterior lower legs against a small table. She developed laceration injuries on the left and right tibial areas. The area on the right has since healed. She has been seeing her primary physician at [facility]. She has had a round of Keflex and more recently a round of Levaquin for periwound cellulitis. According to her daughter she seemed to respond better to the Levaquin. I am not aware of any cultures. They have been using antibiotic cream, T and gauze to dress the wounds. As mentioned an area on elfa the right anterior tibia just below the tibial tuberosity has healed. Past medical history; the patient is not a diabetic nor does she have any history of PAD. She has a history of anemia, depression, gastroesophageal reflux disease, bilateral hearing loss, hyperlipidemia, hypothyroidism, coronary artery artery disease osteoporosis  Ointment 1 x Per Week/30 Days Discharge Instructions: Apply Mupirocin (Bactroban) as instructed Prim Dressing: PolyMem Silver Non-Adhesive Dressing, 4.25x4.25 in 1 x Per Week/30 Days ary Discharge Instructions: Apply to wound bed as instructed Secondary Dressing: Woven Gauze Sponge, Non-Sterile 4x4 in 1 x Per Week/30 Days Discharge Instructions: Apply over primary dressing as directed. Secured With: Transpore Surgical Tape, 2x10 (in/yd) 1 x Per Week/30 Days Discharge Instructions: Secure dressing with tape as directed. Compression Wrap: Kerlix Roll 4.5x3.1 (in/yd) 1 x Per Week/30 Days Discharge Instructions: Apply Kerlix and Coban compression as directed. Compression Wrap: Coban Self-Adherent Wrap 4x5 (in/yd) 1 x Per Week/30 Days Discharge Instructions: Apply over Kerlix as directed. Compression Wrap: Tubular netting #5 1 x Per Week/30 Days Patient Medications llergies: trazodone, codeine, clarithromycin, diazepam, Sulfa (Sulfonamide Antibiotics), Prilosec A Notifications Medication Indication Start End 06/12/2023 lidocaine DOSE topical 4 % cream - cream topical Electronic Signature(s) Signed: 06/12/2023 8:40:33 AM By: Duanne Guess MD FACS Entered By: Duanne Guess on 06/12/2023 84:69:62 -------------------------------------------------------------------------------- Problem List Details Patient Name: Date of Service: [name] 06/12/2023 8:15 A M Medical Record  Number: [id number] Patient Account Number: [id number] Date of Birth/Sex: Treating RN: 08-21-1928 (89 y.o. F) Primary Care Provider: Seabron Spates Other Clinician: Referring Provider: Treating Provider/Extender: Verner Mould in Treatment: 6 Active Problems ICD-10 Encounter BRONWYN, UDOH ([id number]) [id number] Page 5 of 10 Encounter Code Description Active Date MDM Diagnosis L97.812 Non-pressure chronic ulcer of other part of right lower leg with fat layer 05/01/2023 No Yes exposed I87.311 Chronic venous hypertension (idiopathic) with ulcer of right lower extremity 05/01/2023 No Yes J44.9 Chronic obstructive pulmonary disease, unspecified 05/01/2023 No Yes T79.8XXD Other early complications of trauma, subsequent encounter 05/01/2023 No Yes Inactive Problems Resolved Problems ICD-10 Code Description Active Date Resolved Date L97.822 Non-pressure chronic ulcer of other part of left lower leg with fat layer exposed 05/22/2023 05/22/2023 Electronic Signature(s) Signed: 06/12/2023 8:26:40 AM By: Duanne Guess MD FACS Entered By: Duanne Guess on 06/12/2023 05:26:40 -------------------------------------------------------------------------------- Progress Note Details Patient Name: Date of Service: [name] 06/12/2023 8:15 A M Medical Record Number: [id number] Patient Account Number: [id number] Date of Birth/Sex: Treating RN: 23-Apr-1928 (89 y.o. F) Primary Care Provider: Seabron Spates Other Clinician: Referring Provider: Treating Provider/Extender: Verner Mould in Treatment: 6 Subjective Chief Complaint Information obtained from Patient 07/26/2018; patient is here for review of laceration injuries on the left anterior tibia area 05/01/2023; patient presents for right lower extremity wounds in the setting of trauma History of Present Illness (HPI) ADMISSION 07/26/2018 This is an  89 year old woman who lives independently in her own apartment. 3 weeks ago she suffered a fall traumatizing her anterior lower legs against a small table. She developed laceration injuries on the left and right tibial areas. The area on the right has since healed. She has been seeing her primary physician at [facility]. She has had a round of Keflex and more recently a round of Levaquin for periwound cellulitis. According to her daughter she seemed to respond better to the Levaquin. I am not aware of any cultures. They have been using antibiotic cream, T and gauze to dress the wounds. As mentioned an area on elfa the right anterior tibia just below the tibial tuberosity has healed. Past medical history; the patient is not a diabetic nor does she have any history of PAD. She has a history of anemia, depression, gastroesophageal reflux disease, bilateral hearing loss, hyperlipidemia, hypothyroidism, coronary artery artery disease osteoporosis  Ointment 1 x Per Week/30 Days Discharge Instructions: Apply Mupirocin (Bactroban) as instructed Prim Dressing: PolyMem Silver Non-Adhesive Dressing, 4.25x4.25 in 1 x Per Week/30 Days ary Discharge Instructions: Apply to wound bed as instructed Secondary Dressing: Woven Gauze Sponge, Non-Sterile 4x4 in 1 x Per Week/30 Days Discharge Instructions: Apply over primary dressing as directed. Secured With: Transpore Surgical Tape, 2x10 (in/yd) 1 x Per Week/30 Days Discharge Instructions: Secure dressing with tape as directed. Compression Wrap: Kerlix Roll 4.5x3.1 (in/yd) 1 x Per Week/30 Days Discharge Instructions: Apply Kerlix and Coban compression as directed. Compression Wrap: Coban Self-Adherent Wrap 4x5 (in/yd) 1 x Per Week/30 Days Discharge Instructions: Apply over Kerlix as directed. Compression Wrap: Tubular netting #5 1 x Per Week/30 Days Patient Medications llergies: trazodone, codeine, clarithromycin, diazepam, Sulfa (Sulfonamide Antibiotics), Prilosec A Notifications Medication Indication Start End 06/12/2023 lidocaine DOSE topical 4 % cream - cream topical Electronic Signature(s) Signed: 06/12/2023 8:40:33 AM By: Duanne Guess MD FACS Entered By: Duanne Guess on 06/12/2023 84:69:62 -------------------------------------------------------------------------------- Problem List Details Patient Name: Date of Service: [name] 06/12/2023 8:15 A M Medical Record  Number: [id number] Patient Account Number: [id number] Date of Birth/Sex: Treating RN: 08-21-1928 (89 y.o. F) Primary Care Provider: Seabron Spates Other Clinician: Referring Provider: Treating Provider/Extender: Verner Mould in Treatment: 6 Active Problems ICD-10 Encounter BRONWYN, UDOH ([id number]) [id number] Page 5 of 10 Encounter Code Description Active Date MDM Diagnosis L97.812 Non-pressure chronic ulcer of other part of right lower leg with fat layer 05/01/2023 No Yes exposed I87.311 Chronic venous hypertension (idiopathic) with ulcer of right lower extremity 05/01/2023 No Yes J44.9 Chronic obstructive pulmonary disease, unspecified 05/01/2023 No Yes T79.8XXD Other early complications of trauma, subsequent encounter 05/01/2023 No Yes Inactive Problems Resolved Problems ICD-10 Code Description Active Date Resolved Date L97.822 Non-pressure chronic ulcer of other part of left lower leg with fat layer exposed 05/22/2023 05/22/2023 Electronic Signature(s) Signed: 06/12/2023 8:26:40 AM By: Duanne Guess MD FACS Entered By: Duanne Guess on 06/12/2023 05:26:40 -------------------------------------------------------------------------------- Progress Note Details Patient Name: Date of Service: [name] 06/12/2023 8:15 A M Medical Record Number: [id number] Patient Account Number: [id number] Date of Birth/Sex: Treating RN: 23-Apr-1928 (89 y.o. F) Primary Care Provider: Seabron Spates Other Clinician: Referring Provider: Treating Provider/Extender: Verner Mould in Treatment: 6 Subjective Chief Complaint Information obtained from Patient 07/26/2018; patient is here for review of laceration injuries on the left anterior tibia area 05/01/2023; patient presents for right lower extremity wounds in the setting of trauma History of Present Illness (HPI) ADMISSION 07/26/2018 This is an  89 year old woman who lives independently in her own apartment. 3 weeks ago she suffered a fall traumatizing her anterior lower legs against a small table. She developed laceration injuries on the left and right tibial areas. The area on the right has since healed. She has been seeing her primary physician at [facility]. She has had a round of Keflex and more recently a round of Levaquin for periwound cellulitis. According to her daughter she seemed to respond better to the Levaquin. I am not aware of any cultures. They have been using antibiotic cream, T and gauze to dress the wounds. As mentioned an area on elfa the right anterior tibia just below the tibial tuberosity has healed. Past medical history; the patient is not a diabetic nor does she have any history of PAD. She has a history of anemia, depression, gastroesophageal reflux disease, bilateral hearing loss, hyperlipidemia, hypothyroidism, coronary artery artery disease osteoporosis

## 2023-06-19 ENCOUNTER — Encounter (HOSPITAL_BASED_OUTPATIENT_CLINIC_OR_DEPARTMENT_OTHER): Payer: Medicare Other | Admitting: General Surgery

## 2023-06-19 DIAGNOSIS — Z87891 Personal history of nicotine dependence: Secondary | ICD-10-CM | POA: Diagnosis not present

## 2023-06-19 DIAGNOSIS — D649 Anemia, unspecified: Secondary | ICD-10-CM | POA: Diagnosis not present

## 2023-06-19 DIAGNOSIS — J449 Chronic obstructive pulmonary disease, unspecified: Secondary | ICD-10-CM | POA: Diagnosis not present

## 2023-06-19 DIAGNOSIS — I872 Venous insufficiency (chronic) (peripheral): Secondary | ICD-10-CM | POA: Diagnosis not present

## 2023-06-19 DIAGNOSIS — L97812 Non-pressure chronic ulcer of other part of right lower leg with fat layer exposed: Secondary | ICD-10-CM | POA: Diagnosis not present

## 2023-06-19 DIAGNOSIS — L97822 Non-pressure chronic ulcer of other part of left lower leg with fat layer exposed: Secondary | ICD-10-CM | POA: Diagnosis not present

## 2023-06-19 DIAGNOSIS — I10 Essential (primary) hypertension: Secondary | ICD-10-CM | POA: Diagnosis not present

## 2023-06-19 DIAGNOSIS — I779 Disorder of arteries and arterioles, unspecified: Secondary | ICD-10-CM | POA: Diagnosis not present

## 2023-06-19 DIAGNOSIS — I87311 Chronic venous hypertension (idiopathic) with ulcer of right lower extremity: Secondary | ICD-10-CM | POA: Diagnosis not present

## 2023-06-19 DIAGNOSIS — E785 Hyperlipidemia, unspecified: Secondary | ICD-10-CM | POA: Diagnosis not present

## 2023-06-19 DIAGNOSIS — I87301 Chronic venous hypertension (idiopathic) without complications of right lower extremity: Secondary | ICD-10-CM | POA: Diagnosis not present

## 2023-06-19 DIAGNOSIS — E039 Hypothyroidism, unspecified: Secondary | ICD-10-CM | POA: Diagnosis not present

## 2023-06-19 NOTE — Progress Notes (Signed)
N/A N/A Comorbid History: Myocardial Infarction 04/05/2023 N/A N/A Date Acquired: 7 N/A N/A Weeks of Treatment: Open N/A N/A Wound Status: No N/A N/A Wound Recurrence: 0x0x0 N/A N/A Measurements L x W x D (cm) 0 N/A N/A A (cm) : rea 0 N/A N/A Volume (cm) : 100.00% N/A N/A % Reduction in Area: 100.00% N/A N/A % Reduction in Volume: Full Thickness Without Exposed N/A N/A Classification: Support Structures None Present N/A N/A Exudate Amount: Fibrotic scar, thickened scar N/A N/A Wound Margin: None Present (0%) N/A N/A Granulation Amount: None Present (0%) N/A N/A Necrotic Amount: Fascia: No N/A N/A Exposed Structures: Fat Layer (Subcutaneous Tissue): No Tendon: No Muscle: No Joint: No Bone: No Large (67-100%) N/A N/A Epithelialization: Excoriation: No N/A N/A Periwound Skin Texture: Induration: No Callus: No MADISUN, STUCKMAN ([id number]) [id number].pdf Page 5 of 8 Crepitus: No Rash: No Scarring: No Maceration: No N/A N/A Periwound Skin Moisture: Dry/Scaly: No Hemosiderin Staining: Yes N/A N/A Periwound  Skin Color: Atrophie Blanche: No Cyanosis: No Ecchymosis: No Erythema: No Mottled: No Pallor: No Rubor: No No Abnormality N/A N/A Temperature: Treatment Notes Electronic Signature(s) Signed: 06/19/2023 8:14:10 AM By: Duanne Guess MD FACS Entered By: Duanne Guess on 06/19/2023 08:14:10 -------------------------------------------------------------------------------- Multi-Disciplinary Care Plan Details Patient Name: Date of Service: [name] 06/19/2023 8:15 A M Medical Record Number: [id number] Patient Account Number: [id number] Date of Birth/Sex: Treating RN: October 07, 1927 (89 y.o. Tommye Standard Primary Care Rahkim Rabalais: Seabron Spates Other Clinician: Referring Jaiona Simien: Treating Geet Hosking/Extender: Verner Mould in Treatment: 7 Multidisciplinary Care Plan reviewed with physician Active Inactive Electronic Signature(s) Signed: 06/19/2023 12:18:37 PM By: Zenaida Deed RN, BSN Entered By: Zenaida Deed on 06/19/2023 08:11:22 -------------------------------------------------------------------------------- Pain Assessment Details Patient Name: Date of Service: [name] 06/19/2023 8:15 A M Medical Record Number: [id number] Patient Account Number: [id number] Date of Birth/Sex: Treating RN: 12/02/1927 (89 y.o. [name] Primary Care Jandi Swiger: Seabron Spates Other Clinician: Referring Cynthia Cogle: Treating Ricard Faulkner/Extender: Verner Mould in Treatment: 7 Active Problems Location of Pain Severity and Description of Pain Patient Has Paino No Site Locations Rate the pain. [name], [name] ([id number]) [id number] Page 6 of 8 Rate the pain. Current Pain Level: 0 Pain Management and Medication Current Pain Management: Electronic Signature(s) Signed: 06/19/2023 11:55:59 AM By: Samuella Bruin Entered By: Samuella Bruin on 06/19/2023  08:06:08 -------------------------------------------------------------------------------- Patient/Caregiver Education Details Patient Name: Date of Service: [name] 10/25/2024andnbsp8:15 A M Medical Record Number: [id number] Patient Account Number: [id number] Date of Birth/Gender: Treating RN: 10-Sep-1927 (89 y.o. Tommye Standard Primary Care Physician: Seabron Spates Other Clinician: Referring Physician: Treating Physician/Extender: Verner Mould in Treatment: 7 Education Assessment Education Provided To: Patient Education Topics Provided Venous: Methods: Explain/Verbal Responses: Reinforcements needed, State content correctly Wound/Skin Impairment: Methods: Explain/Verbal Responses: Reinforcements needed, State content correctly Electronic Signature(s) Signed: 06/19/2023 12:18:37 PM By: Zenaida Deed RN, BSN Entered By: Zenaida Deed on 06/19/2023 08:07:31 -------------------------------------------------------------------------------- Wound Assessment Details Patient Name: Date of Service: [name] 06/19/2023 8:15 A [name], [name] ([id number]) [id number] Page 7 of 8 Medical Record Number: [id number] Patient Account Number: [id number] Date of Birth/Sex: Treating RN: May 24, 1928 (89 y.o. [name] Primary Care Jalayia Bagheri: Seabron Spates Other Clinician: Referring Melvine Julin: Treating Evangelyne Loja/Extender: Verner Mould in Treatment: 7 Wound Status Wound Number: 4 Primary Etiology: Trauma, Other Wound Location: Right, Distal, Anterior Lower Leg Wound Status: Open Wounding Event: Hematoma Comorbid History: Cataracts, Anemia, Hypotension,  Myocardial Infarction Date Acquired: 04/05/2023 Weeks Of Treatment: 7 Clustered Wound: No Photos Wound Measurements Length: (cm) Width: (cm) Depth: (cm) Area: (cm) Volume: (cm) 0 % Reduction in Area: 100% 0  % Reduction in Volume: 100% 0 Epithelialization: Large (67-100%) 0 Tunneling: No 0 Undermining: No Wound Description Classification: Full Thickness Without Exposed Suppor Wound Margin: Fibrotic scar, thickened scar Exudate Amount: None Present t Structures Foul Odor After Cleansing: No Slough/Fibrino No Wound Bed Granulation Amount: None Present (0%) Exposed Structure Necrotic Amount: None Present (0%) Fascia Exposed: No Fat Layer (Subcutaneous Tissue) Exposed: No Tendon Exposed: No Muscle Exposed: No Joint Exposed: No Bone Exposed: No Periwound Skin Texture Texture Color No Abnormalities Noted: No No Abnormalities Noted: No Callus: No Atrophie Blanche: No Crepitus: No Cyanosis: No Excoriation: No Ecchymosis: No Induration: No Erythema: No Rash: No Hemosiderin Staining: Yes Scarring: No Mottled: No Pallor: No Moisture Rubor: No No Abnormalities Noted: Yes Temperature / Pain Temperature: No Abnormality Electronic Signature(s) Signed: 06/19/2023 11:55:59 AM By: Samuella Bruin Entered By: Samuella Bruin on 06/19/2023 08:09:55 [name] ([id number]) [id number] Page 8 of 8 -------------------------------------------------------------------------------- Vitals Details Patient Name: Date of Service: [name] 06/19/2023 8:15 A M Medical Record Number: [id number] Patient Account Number: [id number] Date of Birth/Sex: Treating RN: 10/13/1927 (89 y.o. [name] Primary Care Faheem Ziemann: Seabron Spates Other Clinician: Referring Dante Cooter: Treating Arlyss Weathersby/Extender: Verner Mould in Treatment: 7 Vital Signs Time Taken: 08:05 Temperature (F): 97.5 Height (in): 62 Pulse (bpm): 77 Weight (lbs): 85 Respiratory Rate (breaths/min): 18 Body Mass Index (BMI): 15.5 Blood Pressure (mmHg): 156/67 Reference Range: 80 - 120 mg / dl Electronic Signature(s) Signed: 06/19/2023 11:55:59 AM  By: Samuella Bruin Entered By: Samuella Bruin on 06/19/2023 08:06:02  [name], [name] ([id number]) [id number] Page 1 of 8 Visit Report for 06/19/2023 Arrival Information Details Patient Name: Date of Service: [name] 06/19/2023 8:15 A M Medical Record Number: [id number] Patient Account Number: [id number] Date of Birth/Sex: Treating RN: 03-11-1928 (89 y.o. [name] Primary Care Lena Gores: Seabron Spates Other Clinician: Referring Kymberly Blomberg: Treating Fryda Molenda/Extender: Verner Mould in Treatment: 7 Visit Information History Since Last Visit Added or deleted any medications: No Patient Arrived: Wheel Chair Had a fall or experienced change in No Arrival Time: 08:01 activities of daily living that may affect Accompanied By: daughter risk of falls: Transfer Assistance: None Signs or symptoms of abuse/neglect since last visito No Patient Identification Verified: Yes Hospitalized since last visit: No Secondary Verification Process Completed: Yes Implantable device outside of the clinic excluding No Patient Has Alerts: Yes cellular tissue based products placed in the center Patient Alerts: R ABI [place] since last visit: Pain Present Now: No Electronic Signature(s) Signed: 06/19/2023 11:55:59 AM By: Samuella Bruin Entered By: Samuella Bruin on 06/19/2023 08:05:40 -------------------------------------------------------------------------------- Clinic Level of Care Assessment Details Patient Name: Date of Service: [facility] Ronney [name] 06/19/2023 8:15 A M Medical Record Number: [id number] Patient Account Number: [id number] Date of Birth/Sex: Treating RN: 02/12/1928 (89 y.o. [name] Primary Care Labella Zahradnik: Seabron Spates Other Clinician: Referring Jerrick Farve: Treating Xochilth Standish/Extender: Verner Mould in Treatment: 7 Clinic Level of Care Assessment Items TOOL 4 Quantity Score X- 1 0 Use when only an EandM is performed on  FOLLOW-UP visit ASSESSMENTS - Nursing Assessment / Reassessment []  - 0 Reassessment of Co-morbidities (includes updates in patient status) X- 1 5 Reassessment of Adherence to Treatment Plan ASSESSMENTS - Wound and Skin A ssessment / Reassessment X - Simple Wound Assessment / Reassessment - one wound 1 5 []  - 0 Complex Wound Assessment / Reassessment - multiple wounds []  - 0 Dermatologic / Skin Assessment (not related to wound area) ASSESSMENTS - Focused Assessment X- 1 5 Circumferential Edema Measurements - multi extremities []  - 0 Nutritional Assessment / Counseling / Intervention X- 1 5 Lower Extremity Assessment (monofilament, tuning fork, pulses) [name], [name] ([id number]) [id number] Page 2 of 8 []  - 0 Peripheral Arterial Disease Assessment (using hand held doppler) ASSESSMENTS - Ostomy and/or Continence Assessment and Care []  - 0 Incontinence Assessment and Management []  - 0 Ostomy Care Assessment and Management (repouching, etc.) PROCESS - Coordination of Care X - Simple Patient / Family Education for ongoing care 1 15 []  - 0 Complex (extensive) Patient / Family Education for ongoing care X- 1 10 Staff obtains Chiropractor, Records, T Results / Process Orders est []  - 0 Staff telephones HHA, Nursing Homes / Clarify orders / etc []  - 0 Routine Transfer to another Facility (non-emergent condition) []  - 0 Routine Hospital Admission (non-emergent condition) []  - 0 New Admissions / Manufacturing engineer / Ordering NPWT Apligraf, etc. , []  - 0 Emergency Hospital Admission (emergent condition) X- 1 10 Simple Discharge Coordination []  - 0 Complex (extensive) Discharge Coordination PROCESS - Special Needs []  - 0 Pediatric / Minor Patient Management []  - 0 Isolation Patient Management []  - 0 Hearing / Language / Visual special needs []  - 0 Assessment of Community assistance (transportation, D/C planning, etc.) []  - 0 Additional  assistance / Altered mentation []  - 0 Support Surface(s) Assessment (bed, cushion, seat, etc.) INTERVENTIONS - Wound Cleansing / Measurement X - Simple Wound Cleansing - one wound 1 5 []  - 0 Complex Wound Cleansing -  N/A N/A Comorbid History: Myocardial Infarction 04/05/2023 N/A N/A Date Acquired: 7 N/A N/A Weeks of Treatment: Open N/A N/A Wound Status: No N/A N/A Wound Recurrence: 0x0x0 N/A N/A Measurements L x W x D (cm) 0 N/A N/A A (cm) : rea 0 N/A N/A Volume (cm) : 100.00% N/A N/A % Reduction in Area: 100.00% N/A N/A % Reduction in Volume: Full Thickness Without Exposed N/A N/A Classification: Support Structures None Present N/A N/A Exudate Amount: Fibrotic scar, thickened scar N/A N/A Wound Margin: None Present (0%) N/A N/A Granulation Amount: None Present (0%) N/A N/A Necrotic Amount: Fascia: No N/A N/A Exposed Structures: Fat Layer (Subcutaneous Tissue): No Tendon: No Muscle: No Joint: No Bone: No Large (67-100%) N/A N/A Epithelialization: Excoriation: No N/A N/A Periwound Skin Texture: Induration: No Callus: No MADISUN, STUCKMAN ([id number]) [id number].pdf Page 5 of 8 Crepitus: No Rash: No Scarring: No Maceration: No N/A N/A Periwound Skin Moisture: Dry/Scaly: No Hemosiderin Staining: Yes N/A N/A Periwound  Skin Color: Atrophie Blanche: No Cyanosis: No Ecchymosis: No Erythema: No Mottled: No Pallor: No Rubor: No No Abnormality N/A N/A Temperature: Treatment Notes Electronic Signature(s) Signed: 06/19/2023 8:14:10 AM By: Duanne Guess MD FACS Entered By: Duanne Guess on 06/19/2023 08:14:10 -------------------------------------------------------------------------------- Multi-Disciplinary Care Plan Details Patient Name: Date of Service: [name] 06/19/2023 8:15 A M Medical Record Number: [id number] Patient Account Number: [id number] Date of Birth/Sex: Treating RN: October 07, 1927 (89 y.o. Tommye Standard Primary Care Rahkim Rabalais: Seabron Spates Other Clinician: Referring Jaiona Simien: Treating Geet Hosking/Extender: Verner Mould in Treatment: 7 Multidisciplinary Care Plan reviewed with physician Active Inactive Electronic Signature(s) Signed: 06/19/2023 12:18:37 PM By: Zenaida Deed RN, BSN Entered By: Zenaida Deed on 06/19/2023 08:11:22 -------------------------------------------------------------------------------- Pain Assessment Details Patient Name: Date of Service: [name] 06/19/2023 8:15 A M Medical Record Number: [id number] Patient Account Number: [id number] Date of Birth/Sex: Treating RN: 12/02/1927 (89 y.o. [name] Primary Care Jandi Swiger: Seabron Spates Other Clinician: Referring Cynthia Cogle: Treating Ricard Faulkner/Extender: Verner Mould in Treatment: 7 Active Problems Location of Pain Severity and Description of Pain Patient Has Paino No Site Locations Rate the pain. [name], [name] ([id number]) [id number] Page 6 of 8 Rate the pain. Current Pain Level: 0 Pain Management and Medication Current Pain Management: Electronic Signature(s) Signed: 06/19/2023 11:55:59 AM By: Samuella Bruin Entered By: Samuella Bruin on 06/19/2023  08:06:08 -------------------------------------------------------------------------------- Patient/Caregiver Education Details Patient Name: Date of Service: [name] 10/25/2024andnbsp8:15 A M Medical Record Number: [id number] Patient Account Number: [id number] Date of Birth/Gender: Treating RN: 10-Sep-1927 (89 y.o. Tommye Standard Primary Care Physician: Seabron Spates Other Clinician: Referring Physician: Treating Physician/Extender: Verner Mould in Treatment: 7 Education Assessment Education Provided To: Patient Education Topics Provided Venous: Methods: Explain/Verbal Responses: Reinforcements needed, State content correctly Wound/Skin Impairment: Methods: Explain/Verbal Responses: Reinforcements needed, State content correctly Electronic Signature(s) Signed: 06/19/2023 12:18:37 PM By: Zenaida Deed RN, BSN Entered By: Zenaida Deed on 06/19/2023 08:07:31 -------------------------------------------------------------------------------- Wound Assessment Details Patient Name: Date of Service: [name] 06/19/2023 8:15 A [name], [name] ([id number]) [id number] Page 7 of 8 Medical Record Number: [id number] Patient Account Number: [id number] Date of Birth/Sex: Treating RN: May 24, 1928 (89 y.o. [name] Primary Care Jalayia Bagheri: Seabron Spates Other Clinician: Referring Melvine Julin: Treating Evangelyne Loja/Extender: Verner Mould in Treatment: 7 Wound Status Wound Number: 4 Primary Etiology: Trauma, Other Wound Location: Right, Distal, Anterior Lower Leg Wound Status: Open Wounding Event: Hematoma Comorbid History: Cataracts, Anemia, Hypotension,

## 2023-06-19 NOTE — Progress Notes (Addendum)
[name], [name] ([id number]) [id number] Page 1 of 8 Visit Report for 06/19/2023 Chief Complaint Document Details Patient Name: Date of Service: [name] 06/19/2023 8:15 A M Medical Record Number: [id number] Patient Account Number: [id number] Date of Birth/Sex: Treating RN: [birth year] (89 y.o. F) Primary Care Provider: Seabron Spates Other Clinician: Referring Provider: Treating Provider/Extender: Verner Mould in Treatment: 7 Information Obtained from: Patient Chief Complaint 07/26/2018; patient is here for review of laceration injuries on the left anterior tibia area 05/01/2023; patient presents for right lower extremity wounds in the setting of trauma Electronic Signature(s) Signed: 06/19/2023 8:14:18 AM By: Duanne Guess MD FACS Entered By: Duanne Guess on 06/19/2023 08:14:18 -------------------------------------------------------------------------------- HPI Details Patient Name: Date of Service: [name] 06/19/2023 8:15 A M Medical Record Number: [id number] Patient Account Number: [id number] Date of Birth/Sex: Treating RN: 1928/08/08 (89 y.o. F) Primary Care Provider: Seabron Spates Other Clinician: Referring Provider: Treating Provider/Extender: Verner Mould in Treatment: 7 History of Present Illness HPI Description: ADMISSION 07/26/2018 This is an 89 year old woman who lives independently in her own apartment. 3 weeks ago she suffered a fall traumatizing her anterior lower legs against a small table. She developed laceration injuries on the left and right tibial areas. The area on the right has since healed. She has been seeing her primary physician at [facility]. She has had a round of Keflex and more recently a round of Levaquin for periwound cellulitis. According to her daughter she seemed to respond better to the Levaquin. I am not aware of any cultures.  They have been using antibiotic cream, T and gauze to dress the wounds. As mentioned an area on elfa the right anterior tibia just below the tibial tuberosity has healed. Past medical history; the patient is not a diabetic nor does she have any history of PAD. She has a history of anemia, depression, gastroesophageal reflux disease, bilateral hearing loss, hyperlipidemia, hypothyroidism, coronary artery artery disease osteoporosis and hypothyroidism she has a history of renal cell carcinoma the exact status of this is uncertain In our clinic her ABIs were very poor at 0.33 and 0.25. Her nurse reported that the waveforms were difficult to Doppler. Interestingly the patient describes before these wounds she would walk for 45 minutes a day without any suggestion of pain in her legs that would suggest claudication. 08/02/2018 the patient's wounds are smaller both on the left anterior tibia area. We have been using silver collagen under Kerlix and light Coban. After talking the patient sheath did talk about stopping when she walks suggesting some degree of possible claudication she is also complaining of heaviness in her legs. The history here is atypical but I think we should go ahead with formal arterial studies given how poor the ABIs were in our clinic. 08/09/2018; 2 areas on the left anterior tibia. Dimensions are slightly smaller. She still has not heard anything from vascular about arterial studies. We have been using Kerlix and light Coban over collagen 08/16/2018; 2 areas on the left anterior tibia, previous on the right has closed over. We have been using silver collagen under Kerlix and light Coban. Patient has her arterial studies on 08/16/2018, should be able to talk about this next week 08/23/2018; ARTERIAL studies showed noncompressible on the right but triphasic and biphasic waveforms at the PTA and DP respectively. TBI slightly reduced at 0.62. On the left again triphasic and biphasic  waveforms at the PTA and DP respectively. ABI noncompressible. TBI  Victorino Dike MD  FACS Entered By: Duanne Guess on 06/19/2023 08:15:09 -------------------------------------------------------------------------------- Physician Orders Details Patient Name: Date of Service: [name] 06/19/2023 8:15 A M Medical Record Number: [id number] Patient Account Number: [id number] Date of Birth/Sex: Treating RN: 03-Jun-1928 (89 y.o. [name] Primary Care Provider: Seabron Spates Other Clinician: Referring Provider: Treating Provider/Extender: Verner Mould in Treatment: [address] ([id number]) [id number] Page 3 of 8 The following information was scribed by: Samuella Bruin The information was scribed for: Duanne Guess Verbal / Phone Orders: No Diagnosis Coding ICD-10 Coding Code Description [phone] Non-pressure chronic ulcer of other part of right lower leg with fat layer exposed I87.311 Chronic venous hypertension (idiopathic) with ulcer of right lower extremity J44.9 Chronic obstructive pulmonary disease, unspecified T79.8XXD Other early complications of trauma, subsequent encounter Discharge From [facility] Services Discharge from Wound Care Center - Congratulations!!!!!!!! Anesthetic (In clinic) Topical Lidocaine 4% applied to wound bed Patient Medications llergies: trazodone, codeine, clarithromycin, diazepam, Sulfa (Sulfonamide Antibiotics), Prilosec A Notifications Medication Indication Start End 06/19/2023 lidocaine DOSE topical 4 % cream - cream topical Electronic Signature(s) Signed: 06/19/2023 8:15:18 AM By: Duanne Guess MD FACS Entered By: Duanne Guess on 06/19/2023 08:15:18 -------------------------------------------------------------------------------- Problem List Details Patient Name: Date of Service: [name] 06/19/2023 8:15 A M Medical Record Number: [id number] Patient Account Number: [id number] Date of Birth/Sex: Treating RN: 13-Aug-1928 (89  y.o. Tommye Standard Primary Care Provider: Seabron Spates Other Clinician: Referring Provider: Treating Provider/Extender: Verner Mould in Treatment: 7 Active Problems ICD-10 Encounter Code Description Active Date MDM Diagnosis L97.812 Non-pressure chronic ulcer of other part of right lower leg with fat layer 05/01/2023 No Yes exposed I87.311 Chronic venous hypertension (idiopathic) with ulcer of right lower extremity 05/01/2023 No Yes J44.9 Chronic obstructive pulmonary disease, unspecified 05/01/2023 No Yes T79.8XXD Other early complications of trauma, subsequent encounter 05/01/2023 No Yes Inactive Problems ZYLAH, [name] ([id number]) [phone].pdf Page 4 of 8 Resolved Problems ICD-10 Code Description Active Date Resolved Date L97.822 Non-pressure chronic ulcer of other part of left lower leg with fat layer exposed 05/22/2023 05/22/2023 Electronic Signature(s) Signed: 06/19/2023 8:13:42 AM By: Duanne Guess MD FACS Entered By: Duanne Guess on 06/19/2023 08:13:42 -------------------------------------------------------------------------------- Progress Note Details Patient Name: Date of Service: [name] 06/19/2023 8:15 A M Medical Record Number: [id number] Patient Account Number: [id number] Date of Birth/Sex: Treating RN: [birth year] (89 y.o. F) Primary Care Provider: Seabron Spates Other Clinician: Referring Provider: Treating Provider/Extender: Verner Mould in Treatment: 7 Subjective Chief Complaint Information obtained from Patient 07/26/2018; patient is here for review of laceration injuries on the left anterior tibia area 05/01/2023; patient presents for right lower extremity wounds in the setting of trauma History of Present Illness (HPI) ADMISSION 07/26/2018 This is an 89 year old woman who lives independently in her own apartment. 3 weeks ago she suffered a fall  traumatizing her anterior lower legs against a small table. She developed laceration injuries on the left and right tibial areas. The area on the right has since healed. She has been seeing her primary physician at [facility]. She has had a round of Keflex and more recently a round of Levaquin for periwound cellulitis. According to her daughter she seemed to respond better to the Levaquin. I am not aware of any cultures. They have been using antibiotic cream, T and gauze to dress the wounds. As mentioned an area on elfa the right anterior tibia just below the tibial tuberosity  [name], [name] ([id number]) [id number] Page 1 of 8 Visit Report for 06/19/2023 Chief Complaint Document Details Patient Name: Date of Service: [name] 06/19/2023 8:15 A M Medical Record Number: [id number] Patient Account Number: [id number] Date of Birth/Sex: Treating RN: [birth year] (89 y.o. F) Primary Care Provider: Seabron Spates Other Clinician: Referring Provider: Treating Provider/Extender: Verner Mould in Treatment: 7 Information Obtained from: Patient Chief Complaint 07/26/2018; patient is here for review of laceration injuries on the left anterior tibia area 05/01/2023; patient presents for right lower extremity wounds in the setting of trauma Electronic Signature(s) Signed: 06/19/2023 8:14:18 AM By: Duanne Guess MD FACS Entered By: Duanne Guess on 06/19/2023 08:14:18 -------------------------------------------------------------------------------- HPI Details Patient Name: Date of Service: [name] 06/19/2023 8:15 A M Medical Record Number: [id number] Patient Account Number: [id number] Date of Birth/Sex: Treating RN: 1928/08/08 (89 y.o. F) Primary Care Provider: Seabron Spates Other Clinician: Referring Provider: Treating Provider/Extender: Verner Mould in Treatment: 7 History of Present Illness HPI Description: ADMISSION 07/26/2018 This is an 89 year old woman who lives independently in her own apartment. 3 weeks ago she suffered a fall traumatizing her anterior lower legs against a small table. She developed laceration injuries on the left and right tibial areas. The area on the right has since healed. She has been seeing her primary physician at [facility]. She has had a round of Keflex and more recently a round of Levaquin for periwound cellulitis. According to her daughter she seemed to respond better to the Levaquin. I am not aware of any cultures.  They have been using antibiotic cream, T and gauze to dress the wounds. As mentioned an area on elfa the right anterior tibia just below the tibial tuberosity has healed. Past medical history; the patient is not a diabetic nor does she have any history of PAD. She has a history of anemia, depression, gastroesophageal reflux disease, bilateral hearing loss, hyperlipidemia, hypothyroidism, coronary artery artery disease osteoporosis and hypothyroidism she has a history of renal cell carcinoma the exact status of this is uncertain In our clinic her ABIs were very poor at 0.33 and 0.25. Her nurse reported that the waveforms were difficult to Doppler. Interestingly the patient describes before these wounds she would walk for 45 minutes a day without any suggestion of pain in her legs that would suggest claudication. 08/02/2018 the patient's wounds are smaller both on the left anterior tibia area. We have been using silver collagen under Kerlix and light Coban. After talking the patient sheath did talk about stopping when she walks suggesting some degree of possible claudication she is also complaining of heaviness in her legs. The history here is atypical but I think we should go ahead with formal arterial studies given how poor the ABIs were in our clinic. 08/09/2018; 2 areas on the left anterior tibia. Dimensions are slightly smaller. She still has not heard anything from vascular about arterial studies. We have been using Kerlix and light Coban over collagen 08/16/2018; 2 areas on the left anterior tibia, previous on the right has closed over. We have been using silver collagen under Kerlix and light Coban. Patient has her arterial studies on 08/16/2018, should be able to talk about this next week 08/23/2018; ARTERIAL studies showed noncompressible on the right but triphasic and biphasic waveforms at the PTA and DP respectively. TBI slightly reduced at 0.62. On the left again triphasic and biphasic  waveforms at the PTA and DP respectively. ABI noncompressible. TBI  lower leg with fat layer exposed Chronic venous hypertension (idiopathic) with ulcer of right lower extremity Chronic obstructive pulmonary disease, unspecified Other early complications of trauma, subsequent encounter Plan Discharge From [facility] Services: Discharge from Wound Care Center - Congratulations!!!!!!!! Anesthetic: (In clinic) Topical Lidocaine 4% applied to wound bed The following medication(s) was prescribed: lidocaine topical 4 % cream cream topical was prescribed at facility 06/19/2023: Her wound is healed. I recommended that she keep it covered with a Band-Aid for the next week or so just to protect the freshly healed area from friction or trauma. We will discharge her from the wound care center. She may follow-up in the future if needed. Electronic Signature(s) Signed: 06/19/2023 8:16:03 AM By: Duanne Guess MD FACS Entered By: Duanne Guess on 06/19/2023 08:16:03 -------------------------------------------------------------------------------- HxROS Details Patient Name: Date of Service: [name] 06/19/2023 8:15 A M Medical Record Number: [id number] Patient Account Number: [id number] Date of Birth/Sex: Treating RN: [birth year] (89 y.o. F) Primary Care Provider: Seabron Spates Other Clinician: Referring Provider: Treating Provider/Extender: Verner Mould in Treatment: 7 Information Obtained From Patient Chart Constitutional Symptoms (General  Health) Medical History: Past Medical History Notes: almost bedridden per dgtr - difficult to stand up and sit down safely Eyes Medical History: Positive for: Cataracts - removed X2 Ear/Nose/Mouth/Throat Medical History: Past Medical History Notes: totally deaf on left ; partial hearing with hearing aide on the left Hematologic/Lymphatic Medical History: Positive for: Anemia - s/p heart surgery [name], [name] ([id number]) [id number] Page 7 of 8 Cardiovascular Medical History: Positive for: Hypotension; Myocardial Infarction Past Medical History Notes: atherosclerosis Endocrine Medical History: Past Medical History Notes: hypothyroid Integumentary (Skin) Medical History: Negative for: History of Burn Past Medical History Notes: wound of right Lower leg Musculoskeletal Medical History: Negative for: Gout; Rheumatoid Arthritis; Osteoarthritis; Osteomyelitis Oncologic Medical History: Negative for: Received Chemotherapy; Received Radiation Past Medical History Notes: 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 (no chemo or radiation for this) Psychiatric Medical History: Past Medical History Notes: depression HBO Extended History Items Eyes: Cataracts Immunizations Pneumococcal Vaccine: Received Pneumococcal Vaccination: Yes Received Pneumococcal Vaccination On or After 60th Birthday: Yes Tetanus Vaccine: Last tetanus shot: 06/08/2016 Implantable Devices None Hospitalization / Surgery History Type of Hospitalization/Surgery renal mass ablation syncope Family and Social History Cancer: Yes - Siblings; Diabetes: No; Heart Disease: Yes - Mother; Hereditary Spherocytosis: No; Hypertension: Yes - Mother; Kidney Disease: Yes - Siblings; Lung Disease: No; Seizures: No; Stroke: No; Thyroid Problems: No; Tuberculosis: No; Former smoker - quit 60 years ago; Marital Status - Widowed; Alcohol Use: Never; Drug Use: No History;  Caffeine Use: Daily - coffee; Financial Concerns: No; Food, Clothing or Shelter Needs: No; Support System Lacking: No; Transportation Concerns: No Electronic Signature(s) Signed: 06/19/2023 8:16:28 AM By: Duanne Guess MD FACS Entered By: Duanne Guess on 06/19/2023 08:14:42 SuperBill Details -------------------------------------------------------------------------------- [name] ([id number]) [id number] Page 8 of 8 Patient Name: Date of Service: [name] 06/19/2023 Medical Record Number: [id number] Patient Account Number: [id number] Date of Birth/Sex: Treating RN: 25-Sep-1927 (89 y.o. [name] Primary Care Provider: Seabron Spates Other Clinician: Referring Provider: Treating Provider/Extender: Verner Mould in Treatment: 7 Diagnosis Coding ICD-10 Codes Code Description [phone] Non-pressure chronic ulcer of other part of right lower leg with fat layer exposed I87.311 Chronic venous hypertension (idiopathic) with ulcer of right lower extremity J44.9 Chronic obstructive pulmonary disease, unspecified T79.8XXD Other early complications of trauma, subsequent encounter Facility Procedures CPT4  slightly reduced at 0.61. She likely has some arterial disease. She does describe some pain in her legs with activity which forces her to stop. This is with quite a bit of walking. Her wound is smaller by about half a centimeter in diameter. We are using [facility] health is changing 08/30/2018; arrives today with a wound not in a viable surface requiring debridement. Still using silver collagen. She does not have a major arterial issue 1/13; the patient's wound is not come down in size. Covered in eschar we have been using silver collagen. Intake nurse noted purulent drainage 1/20; the patient's wounds is half the size. We use silver alginate starting last time. Surface of the wound appears healthy SKII, [name] ([id number]) [phone].pdf Page 2 of 8 1/27; patient's wound only has a minuscule open area. We have been using silver alginate under Kerlix Coban 2/3; patient's left leg is closed. These were initial traumatic wounds. She has some degree of chronic venous insufficiency with hemosiderin deposition. She did not have significant arterial studies by results we did in this clinic 05/01/2023 Ms. Adaleigh Clendenen is a 89 year old female with a past medical history of COPD and venous insufficiency that presents to the clinic for a 1 month history of nonhealing wound to the right lower extremity. She reports hitting her shin against a stepstool creating a wound. She has been keeping the area covered. She has been following with her primary care physician for this issue and has been treated with Keflex and levofloxacin for this issue. She currently denies signs of infection. She does not wear compression stockings. 9/13; patient presents for follow-up. We have been using antibiotic ointment with PolyMem under Kerlix/Coban to the right lower extremity. Wounds are smaller. 05/15/2023: Both wounds are substantially  smaller. Edema control is good. There is a little bit of eschar on the surface of the proximal wound and some slough on the distal wound. 05/22/2023: The proximal wound is healed. The distal wound is smaller with a little bit of slough accumulation but good granulation tissue filling in, as well. She has a new skin tear on the anterior tibial surface of her left leg, just distal to the tibial tuberosity. The skin flap appears to be adhered and viable with just a small open portion with some slough and eschar present. 05/29/2023: The skin tear on her left anterior leg has healed. The remaining wound on her right lower leg is about half the size that it was last week. There is a small amount of eschar around the edges and some slough on the surface. 06/05/2023: The right lower leg wound continues to contract. There is good granulation tissue on the surface with a little bit of dried eschar around the edges. 06/12/2023: The wound is down to just a couple of millimeters and is very superficial with a layer of eschar over the top. 06/19/2023: Her wound is healed. Electronic Signature(s) Signed: 06/19/2023 8:14:32 AM By: Duanne Guess MD FACS Entered By: Duanne Guess on 06/19/2023 08:14:32 -------------------------------------------------------------------------------- Physical Exam Details Patient Name: Date of Service: [name] 06/19/2023 8:15 A M Medical Record Number: [id number] Patient Account Number: [id number] Date of Birth/Sex: Treating RN: 27-Jan-1928 (89 y.o. F) Primary Care Provider: Seabron Spates Other Clinician: Referring Provider: Treating Provider/Extender: Verner Mould in Treatment: 7 Constitutional Hypertensive, asymptomatic. . . . no acute distress. Respiratory Normal work of breathing on room air.. Notes 06/19/2023: Her wound is healed. Electronic Signature(s) Signed: 06/19/2023 8:15:09 AM By: Lady Gary,  [name], [name] ([id number]) [id number] Page 1 of 8 Visit Report for 06/19/2023 Chief Complaint Document Details Patient Name: Date of Service: [name] 06/19/2023 8:15 A M Medical Record Number: [id number] Patient Account Number: [id number] Date of Birth/Sex: Treating RN: [birth year] (89 y.o. F) Primary Care Provider: Seabron Spates Other Clinician: Referring Provider: Treating Provider/Extender: Verner Mould in Treatment: 7 Information Obtained from: Patient Chief Complaint 07/26/2018; patient is here for review of laceration injuries on the left anterior tibia area 05/01/2023; patient presents for right lower extremity wounds in the setting of trauma Electronic Signature(s) Signed: 06/19/2023 8:14:18 AM By: Duanne Guess MD FACS Entered By: Duanne Guess on 06/19/2023 08:14:18 -------------------------------------------------------------------------------- HPI Details Patient Name: Date of Service: [name] 06/19/2023 8:15 A M Medical Record Number: [id number] Patient Account Number: [id number] Date of Birth/Sex: Treating RN: 1928/08/08 (89 y.o. F) Primary Care Provider: Seabron Spates Other Clinician: Referring Provider: Treating Provider/Extender: Verner Mould in Treatment: 7 History of Present Illness HPI Description: ADMISSION 07/26/2018 This is an 89 year old woman who lives independently in her own apartment. 3 weeks ago she suffered a fall traumatizing her anterior lower legs against a small table. She developed laceration injuries on the left and right tibial areas. The area on the right has since healed. She has been seeing her primary physician at [facility]. She has had a round of Keflex and more recently a round of Levaquin for periwound cellulitis. According to her daughter she seemed to respond better to the Levaquin. I am not aware of any cultures.  They have been using antibiotic cream, T and gauze to dress the wounds. As mentioned an area on elfa the right anterior tibia just below the tibial tuberosity has healed. Past medical history; the patient is not a diabetic nor does she have any history of PAD. She has a history of anemia, depression, gastroesophageal reflux disease, bilateral hearing loss, hyperlipidemia, hypothyroidism, coronary artery artery disease osteoporosis and hypothyroidism she has a history of renal cell carcinoma the exact status of this is uncertain In our clinic her ABIs were very poor at 0.33 and 0.25. Her nurse reported that the waveforms were difficult to Doppler. Interestingly the patient describes before these wounds she would walk for 45 minutes a day without any suggestion of pain in her legs that would suggest claudication. 08/02/2018 the patient's wounds are smaller both on the left anterior tibia area. We have been using silver collagen under Kerlix and light Coban. After talking the patient sheath did talk about stopping when she walks suggesting some degree of possible claudication she is also complaining of heaviness in her legs. The history here is atypical but I think we should go ahead with formal arterial studies given how poor the ABIs were in our clinic. 08/09/2018; 2 areas on the left anterior tibia. Dimensions are slightly smaller. She still has not heard anything from vascular about arterial studies. We have been using Kerlix and light Coban over collagen 08/16/2018; 2 areas on the left anterior tibia, previous on the right has closed over. We have been using silver collagen under Kerlix and light Coban. Patient has her arterial studies on 08/16/2018, should be able to talk about this next week 08/23/2018; ARTERIAL studies showed noncompressible on the right but triphasic and biphasic waveforms at the PTA and DP respectively. TBI slightly reduced at 0.62. On the left again triphasic and biphasic  waveforms at the PTA and DP respectively. ABI noncompressible. TBI  [name], [name] ([id number]) [id number] Page 1 of 8 Visit Report for 06/19/2023 Chief Complaint Document Details Patient Name: Date of Service: [name] 06/19/2023 8:15 A M Medical Record Number: [id number] Patient Account Number: [id number] Date of Birth/Sex: Treating RN: [birth year] (89 y.o. F) Primary Care Provider: Seabron Spates Other Clinician: Referring Provider: Treating Provider/Extender: Verner Mould in Treatment: 7 Information Obtained from: Patient Chief Complaint 07/26/2018; patient is here for review of laceration injuries on the left anterior tibia area 05/01/2023; patient presents for right lower extremity wounds in the setting of trauma Electronic Signature(s) Signed: 06/19/2023 8:14:18 AM By: Duanne Guess MD FACS Entered By: Duanne Guess on 06/19/2023 08:14:18 -------------------------------------------------------------------------------- HPI Details Patient Name: Date of Service: [name] 06/19/2023 8:15 A M Medical Record Number: [id number] Patient Account Number: [id number] Date of Birth/Sex: Treating RN: 1928/08/08 (89 y.o. F) Primary Care Provider: Seabron Spates Other Clinician: Referring Provider: Treating Provider/Extender: Verner Mould in Treatment: 7 History of Present Illness HPI Description: ADMISSION 07/26/2018 This is an 89 year old woman who lives independently in her own apartment. 3 weeks ago she suffered a fall traumatizing her anterior lower legs against a small table. She developed laceration injuries on the left and right tibial areas. The area on the right has since healed. She has been seeing her primary physician at [facility]. She has had a round of Keflex and more recently a round of Levaquin for periwound cellulitis. According to her daughter she seemed to respond better to the Levaquin. I am not aware of any cultures.  They have been using antibiotic cream, T and gauze to dress the wounds. As mentioned an area on elfa the right anterior tibia just below the tibial tuberosity has healed. Past medical history; the patient is not a diabetic nor does she have any history of PAD. She has a history of anemia, depression, gastroesophageal reflux disease, bilateral hearing loss, hyperlipidemia, hypothyroidism, coronary artery artery disease osteoporosis and hypothyroidism she has a history of renal cell carcinoma the exact status of this is uncertain In our clinic her ABIs were very poor at 0.33 and 0.25. Her nurse reported that the waveforms were difficult to Doppler. Interestingly the patient describes before these wounds she would walk for 45 minutes a day without any suggestion of pain in her legs that would suggest claudication. 08/02/2018 the patient's wounds are smaller both on the left anterior tibia area. We have been using silver collagen under Kerlix and light Coban. After talking the patient sheath did talk about stopping when she walks suggesting some degree of possible claudication she is also complaining of heaviness in her legs. The history here is atypical but I think we should go ahead with formal arterial studies given how poor the ABIs were in our clinic. 08/09/2018; 2 areas on the left anterior tibia. Dimensions are slightly smaller. She still has not heard anything from vascular about arterial studies. We have been using Kerlix and light Coban over collagen 08/16/2018; 2 areas on the left anterior tibia, previous on the right has closed over. We have been using silver collagen under Kerlix and light Coban. Patient has her arterial studies on 08/16/2018, should be able to talk about this next week 08/23/2018; ARTERIAL studies showed noncompressible on the right but triphasic and biphasic waveforms at the PTA and DP respectively. TBI slightly reduced at 0.62. On the left again triphasic and biphasic  waveforms at the PTA and DP respectively. ABI noncompressible. TBI

## 2023-06-22 DIAGNOSIS — K080 Exfoliation of teeth due to systemic causes: Secondary | ICD-10-CM | POA: Diagnosis not present

## 2023-06-26 ENCOUNTER — Ambulatory Visit (HOSPITAL_BASED_OUTPATIENT_CLINIC_OR_DEPARTMENT_OTHER): Payer: Medicare Other | Admitting: General Surgery

## 2023-07-16 ENCOUNTER — Encounter: Payer: Medicare Other | Admitting: Family Medicine

## 2023-08-07 ENCOUNTER — Ambulatory Visit (INDEPENDENT_AMBULATORY_CARE_PROVIDER_SITE_OTHER): Payer: Medicare Other | Admitting: Family Medicine

## 2023-08-07 ENCOUNTER — Encounter: Payer: Self-pay | Admitting: Family Medicine

## 2023-08-07 VITALS — BP 126/60 | HR 84 | Temp 97.8°F | Resp 16 | Ht 62.5 in | Wt 87.6 lb

## 2023-08-07 DIAGNOSIS — Z0000 Encounter for general adult medical examination without abnormal findings: Secondary | ICD-10-CM | POA: Diagnosis not present

## 2023-08-07 DIAGNOSIS — G4700 Insomnia, unspecified: Secondary | ICD-10-CM

## 2023-08-07 DIAGNOSIS — E039 Hypothyroidism, unspecified: Secondary | ICD-10-CM

## 2023-08-07 DIAGNOSIS — E876 Hypokalemia: Secondary | ICD-10-CM

## 2023-08-07 DIAGNOSIS — E785 Hyperlipidemia, unspecified: Secondary | ICD-10-CM

## 2023-08-07 DIAGNOSIS — M545 Low back pain, unspecified: Secondary | ICD-10-CM

## 2023-08-07 DIAGNOSIS — G2581 Restless legs syndrome: Secondary | ICD-10-CM

## 2023-08-07 DIAGNOSIS — E559 Vitamin D deficiency, unspecified: Secondary | ICD-10-CM | POA: Diagnosis not present

## 2023-08-07 DIAGNOSIS — F419 Anxiety disorder, unspecified: Secondary | ICD-10-CM | POA: Diagnosis not present

## 2023-08-07 DIAGNOSIS — J310 Chronic rhinitis: Secondary | ICD-10-CM

## 2023-08-07 DIAGNOSIS — N289 Disorder of kidney and ureter, unspecified: Secondary | ICD-10-CM | POA: Diagnosis not present

## 2023-08-07 DIAGNOSIS — M159 Polyosteoarthritis, unspecified: Secondary | ICD-10-CM

## 2023-08-07 LAB — LIPID PANEL
Cholesterol: 136 mg/dL (ref 0–200)
HDL: 59.4 mg/dL (ref >39.00)
LDL Cholesterol: 63 mg/dL (ref 0–99)
NonHDL: 76.33
Total CHOL/HDL Ratio: 2
Triglycerides: 69 mg/dL (ref 0.0–149.0)
VLDL: 13.8 mg/dL (ref 0.0–40.0)

## 2023-08-07 LAB — COMPREHENSIVE METABOLIC PANEL
ALT: 20 U/L (ref 0–35)
AST: 26 U/L (ref 0–37)
Albumin: 3.7 g/dL (ref 3.5–5.2)
Alkaline Phosphatase: 73 U/L (ref 39–117)
BUN: 15 mg/dL (ref 6–23)
CO2: 27 meq/L (ref 19–32)
Calcium: 9.4 mg/dL (ref 8.4–10.5)
Chloride: 98 meq/L (ref 96–112)
Creatinine, Ser: 0.89 mg/dL (ref 0.40–1.20)
GFR: 55.94 mL/min — ABNORMAL LOW (ref >60.00)
Glucose, Bld: 87 mg/dL (ref 70–99)
Potassium: 4.7 meq/L (ref 3.5–5.1)
Sodium: 136 meq/L (ref 135–145)
Total Bilirubin: 0.4 mg/dL (ref 0.2–1.2)
Total Protein: 6.1 g/dL (ref 6.0–8.3)

## 2023-08-07 LAB — CBC WITH DIFFERENTIAL/PLATELET
Basophils Absolute: 0 10*3/uL (ref 0.0–0.1)
Basophils Relative: 0.6 % (ref 0.0–3.0)
Eosinophils Absolute: 0 10*3/uL (ref 0.0–0.7)
Eosinophils Relative: 0.8 % (ref 0.0–5.0)
HCT: 29.1 % — ABNORMAL LOW (ref 36.0–46.0)
Hemoglobin: 9.8 g/dL — ABNORMAL LOW (ref 12.0–15.0)
Lymphocytes Relative: 10.5 % — ABNORMAL LOW (ref 12.0–46.0)
Lymphs Abs: 0.6 10*3/uL — ABNORMAL LOW (ref 0.7–4.0)
MCHC: 33.8 g/dL (ref 30.0–36.0)
MCV: 91.5 fL (ref 78.0–100.0)
Monocytes Absolute: 0.4 10*3/uL (ref 0.1–1.0)
Monocytes Relative: 6.5 % (ref 3.0–12.0)
Neutro Abs: 4.8 10*3/uL (ref 1.4–7.7)
Neutrophils Relative %: 81.6 % — ABNORMAL HIGH (ref 43.0–77.0)
Platelets: 389 10*3/uL (ref 150.0–400.0)
RBC: 3.18 Mil/uL — ABNORMAL LOW (ref 3.87–5.11)
RDW: 13.3 % (ref 11.5–15.5)
WBC: 5.9 10*3/uL (ref 4.0–10.5)

## 2023-08-07 LAB — VITAMIN B12: Vitamin B-12: >1537 pg/mL — ABNORMAL HIGH (ref 211–911)

## 2023-08-07 LAB — VITAMIN D 25 HYDROXY (VIT D DEFICIENCY, FRACTURES): VITD: 64.2 ng/mL (ref 30.00–100.00)

## 2023-08-07 LAB — TSH: TSH: 1.29 u[IU]/mL (ref 0.35–5.50)

## 2023-08-07 MED ORDER — ROPINIROLE HCL 1 MG PO TABS: 1.0000 mg | ORAL_TABLET | Freq: Every day | ORAL | 3 refills | Status: DC

## 2023-08-07 MED ORDER — SIMVASTATIN 20 MG PO TABS: 20.0000 mg | ORAL_TABLET | Freq: Every day | ORAL | 3 refills | Status: DC

## 2023-08-07 MED ORDER — LEVOTHYROXINE SODIUM 50 MCG PO TABS: 50.0000 ug | ORAL_TABLET | Freq: Every day | ORAL | 3 refills | Status: DC

## 2023-08-07 MED ORDER — POTASSIUM CHLORIDE CRYS ER 20 MEQ PO TBCR: 20.0000 meq | EXTENDED_RELEASE_TABLET | Freq: Two times a day (BID) | ORAL | 3 refills | Status: DC

## 2023-08-07 MED ORDER — TRAMADOL HCL 50 MG PO TABS: 50.0000 mg | ORAL_TABLET | Freq: Four times a day (QID) | ORAL | 1 refills | Status: DC | PRN

## 2023-08-07 MED ORDER — FLUTICASONE PROPIONATE 50 MCG/ACT NA SUSP: 2.0000 | Freq: Every day | NASAL | 3 refills | Status: DC | PRN

## 2023-08-07 MED ORDER — CLOTRIMAZOLE-BETAMETHASONE 1-0.05 % EX CREA: 1.0000 | TOPICAL_CREAM | Freq: Every day | CUTANEOUS | 1 refills | Status: DC

## 2023-08-07 MED ORDER — PROMETHAZINE HCL 25 MG PO TABS: 25.0000 mg | ORAL_TABLET | Freq: Three times a day (TID) | ORAL | 0 refills | Status: DC | PRN

## 2023-08-07 MED ORDER — TIZANIDINE HCL 2 MG PO TABS: 2.0000 mg | ORAL_TABLET | Freq: Every day | ORAL | 0 refills | Status: DC | PRN

## 2023-08-07 MED ORDER — FERROUS FUMARATE 325 (106 FE) MG PO TABS: 325.0000 mg | ORAL_TABLET | Freq: Every day | ORAL | 3 refills | Status: DC

## 2023-08-07 MED ORDER — ALPRAZOLAM 0.5 MG PO TABS: 0.5000 mg | ORAL_TABLET | Freq: Two times a day (BID) | ORAL | 0 refills | Status: DC | PRN

## 2023-08-07 MED ORDER — FUROSEMIDE 40 MG PO TABS: 40.0000 mg | ORAL_TABLET | Freq: Every day | ORAL | 3 refills | Status: DC | PRN

## 2023-08-07 MED ORDER — MIRTAZAPINE 15 MG PO TABS: 15.0000 mg | ORAL_TABLET | Freq: Every day | ORAL | 1 refills | Status: DC

## 2023-08-07 MED ORDER — FOLIC ACID 1 MG PO TABS: 1.0000 mg | ORAL_TABLET | Freq: Every day | ORAL | 3 refills | Status: DC

## 2023-08-07 NOTE — Assessment & Plan Note (Signed)
Check labs 

## 2023-08-07 NOTE — Patient Instructions (Signed)
Preventive Care 65 Years and Older, Female Preventive care refers to lifestyle choices and visits with your health care provider that can promote health and wellness. Preventive care visits are also called wellness exams. What can I expect for my preventive care visit? Counseling Your health care provider may ask you questions about your: Medical history, including: Past medical problems. Family medical history. Pregnancy and menstrual history. History of falls. Current health, including: Memory and ability to understand (cognition). Emotional well-being. Home life and relationship well-being. Sexual activity and sexual health. Lifestyle, including: Alcohol, nicotine or tobacco, and drug use. Access to firearms. Diet, exercise, and sleep habits. Work and work environment. Sunscreen use. Safety issues such as seatbelt and bike helmet use. Physical exam Your health care provider will check your: Height and weight. These may be used to calculate your BMI (body mass index). BMI is a measurement that tells if you are at a healthy weight. Waist circumference. This measures the distance around your waistline. This measurement also tells if you are at a healthy weight and may help predict your risk of certain diseases, such as type 2 diabetes and high blood pressure. Heart rate and blood pressure. Body temperature. Skin for abnormal spots. What immunizations do I need?  Vaccines are usually given at various ages, according to a schedule. Your health care provider will recommend vaccines for you based on your age, medical history, and lifestyle or other factors, such as travel or where you work. What tests do I need? Screening Your health care provider may recommend screening tests for certain conditions. This may include: Lipid and cholesterol levels. Hepatitis C test. Hepatitis B test. HIV (human immunodeficiency virus) test. STI (sexually transmitted infection) testing, if you are at  risk. Lung cancer screening. Colorectal cancer screening. Diabetes screening. This is done by checking your blood sugar (glucose) after you have not eaten for a while (fasting). Mammogram. Talk with your health care provider about how often you should have regular mammograms. BRCA-related cancer screening. This may be done if you have a family history of breast, ovarian, tubal, or peritoneal cancers. Bone density scan. This is done to screen for osteoporosis. Talk with your health care provider about your test results, treatment options, and if necessary, the need for more tests. Follow these instructions at home: Eating and drinking  Eat a diet that includes fresh fruits and vegetables, whole grains, lean protein, and low-fat dairy products. Limit your intake of foods with high amounts of sugar, saturated fats, and salt. Take vitamin and mineral supplements as recommended by your health care provider. Do not drink alcohol if your health care provider tells you not to drink. If you drink alcohol: Limit how much you have to 0-1 drink a day. Know how much alcohol is in your drink. In the [place] one drink equals one 12 oz bottle of beer (355 mL), one 5 oz glass of wine (148 mL), or one 1 oz glass of hard liquor (44 mL). Lifestyle Brush your teeth every morning and night with fluoride toothpaste. Floss one time each day. Exercise for at least 30 minutes 5 or more days each week. Do not use any products that contain nicotine or tobacco. These products include cigarettes, chewing tobacco, and vaping devices, such as e-cigarettes. If you need help quitting, ask your health care provider. Do not use drugs. If you are sexually active, practice safe sex. Use a condom or other form of protection in order to prevent STIs. Take aspirin only as told by   your health care provider. Make sure that you understand how much to take and what form to take. Work with your health care provider to find out whether it  is safe and beneficial for you to take aspirin daily. Ask your health care provider if you need to take a cholesterol-lowering medicine (statin). Find healthy ways to manage stress, such as: Meditation, yoga, or listening to music. Journaling. Talking to a trusted person. Spending time with friends and family. Minimize exposure to UV radiation to reduce your risk of skin cancer. Safety Always wear your seat belt while driving or riding in a vehicle. Do not drive: If you have been drinking alcohol. Do not ride with someone who has been drinking. When you are tired or distracted. While texting. If you have been using any mind-altering substances or drugs. Wear a helmet and other protective equipment during sports activities. If you have firearms in your house, make sure you follow all gun safety procedures. What's next? Visit your health care provider once a year for an annual wellness visit. Ask your health care provider how often you should have your eyes and teeth checked. Stay up to date on all vaccines. This information is not intended to replace advice given to you by your health care provider. Make sure you discuss any questions you have with your health care provider. Document Revised: 02/06/2021 Document Reviewed: 02/06/2021 [employer] Patient Education  2024 [employer]  

## 2023-08-07 NOTE — Assessment & Plan Note (Signed)
Encourage heart healthy diet such as MIND or DASH diet, increase exercise, avoid trans fats, simple carbohydrates and processed foods, consider a krill or fish or flaxseed oil cap daily.  °

## 2023-08-07 NOTE — Progress Notes (Signed)
Established Patient Office Visit  Subjective   Patient ID: [name], female    DOB: 1928/01/20  Age: 89 y.o. MRN: [id number]  Chief Complaint  Patient presents with   Annual Exam    Pt states fasting     HPI Discussed the use of AI scribe software for clinical note transcription with the patient, who gave verbal consent to proceed.  History of Present Illness   The patient, with a history of chronic allergies ,  The patient also reports chronic nausea, for which she has been prescribed promethazine. However, she has had difficulty getting this medication covered by her insurance. She also takes tramadol and tizanidine, a muscle relaxer, for unspecified conditions. The patient has been experiencing muscle cramps and has had trouble sleeping, occasionally going two to three days without sleep. She has been prescribed temazepam for sleep, but has had issues with insurance coverage for this medication as well.  The patient has a history of shingles but has not received the shingles vaccine. She also has chronic allergies, which she manages with Flonase nasal spray. She reports frequent rhinorrhea but has not been tested for allergies. The patient's appetite is reportedly good, but she does not eat a lot and only eats what she can tolerate. She has been using Pedialyte Advanced for dehydration.      Patient Active Problem List   Diagnosis Date Noted   Chronic obstructive pulmonary disease, unspecified COPD type (HCC) 02/26/2021   PVD (peripheral vascular disease) (HCC) 02/26/2021   Severe protein-calorie malnutrition (HCC) 11/26/2020   Preventative health care 11/08/2018   Open wound of left lower leg 07/15/2018   Fall 07/10/2018   Wound of left leg 07/10/2018   Wound of right leg, initial encounter 07/10/2018   Pancreatic mass    Osteoarthritis of multiple joints 11/05/2017   Atherosclerosis of native coronary artery of native heart without angina pectoris 11/05/2017   Right  renal mass    Suspicious nevus 06/06/2013   Allergic drug reaction 07/23/2012   RLS (restless legs syndrome) 05/31/2012   Insomnia 05/26/2011   Osteoporosis 05/26/2011   Hypothyroidism 05/10/2008   HYPERCALCEMIA 03/16/2008   UTI 03/06/2008   Weakness generalized 03/06/2008   Hyperlipidemia 04/12/2007   ARTIFICIAL MENOPAUSE 04/12/2007   Anxiety state 01/12/2007   DEPRESSION 01/12/2007   MYOCARDIAL INFARCTION, HX OF 01/12/2007   Past Medical History:  Diagnosis Date   Allergy    Anemia    Anxiety    Cataract 05/2011   Chronic low blood pressure    due to dehydration   Depression    Esophageal disorder    Esophageal yeast infection (HCC)    GERD (gastroesophageal reflux disease)    Hearing loss    hearing aids   Hyperlipidemia    Hypothyroidism    Myocardial infarction (HCC)    Osteoporosis    Renal cell carcinoma (HCC)    found on CT   Thyroid disease    hypothyroidism   Transfusion history    last 2004   Past Surgical History:  Procedure Laterality Date   ABDOMINAL HYSTERECTOMY     APPENDECTOMY     BLEPHAROPLASTY Right    x 3 (all related to basal cell carcinoma)   COLONOSCOPY     CORONARY ARTERY BYPASS GRAFT     x2 vessel bypass -Dr. Excell Seltzer -[facility] L[facility] 06-22-14   EUS N/A 02/04/2018   Procedure: UPPER ENDOSCOPIC ULTRASOUND (EUS) RADIAL;  Surgeon: Rachael Fee, MD;  Location: [facility];  Service: Endoscopy;  Laterality: N/A;   EYE SURGERY     FINE NEEDLE ASPIRATION N/A 02/04/2018   Procedure: FINE NEEDLE ASPIRATION (FNA) LINEAR;  Surgeon: Rachael Fee, MD;  Location: [facility];  Service: Endoscopy;  Laterality: N/A;   FRACTURE SURGERY     IR GENERIC HISTORICAL  08/29/2014   IR RADIOLOGIST EVAL & MGMT 08/29/2014 Irish Lack, MD [facility]   IR GENERIC HISTORICAL  10/22/2016   IR RADIOLOGIST EVAL & MGMT 10/22/2016 Irish Lack, MD [facility]   IR RADIOLOGIST EVAL & MGMT  11/03/2017   IR RADIOLOGIST EVAL & MGMT  02/15/2019    KYPHOSIS SURGERY     obstructed ureter Left    repair-operative   RENAL CRYOABLATION Right 09/2014   TONSILLECTOMY     TOTAL ABDOMINAL HYSTERECTOMY W/ BILATERAL SALPINGOOPHORECTOMY     Social History   Tobacco Use   Smoking status: Former    Current packs/day: 0.00    Average packs/day: 1 pack/day for 4.0 years (4.0 ttl pk-yrs)    Types: Cigarettes    Quit date: 08/25/1958    Years since quitting: 64.9   Smokeless tobacco: Never  Substance Use Topics   Alcohol use: No   Drug use: No   Social History   Socioeconomic History   Marital status: Widowed    Spouse name: Not on file   Number of children: 2   Years of education: Not on file   Highest education level: 12th grade  Occupational History   Occupation: Retired    Associate Professor: RETIRED  Tobacco Use   Smoking status: Former    Current packs/day: 0.00    Average packs/day: 1 pack/day for 4.0 years (4.0 ttl pk-yrs)    Types: Cigarettes    Quit date: 08/25/1958    Years since quitting: 64.9   Smokeless tobacco: Never  Substance and Sexual Activity   Alcohol use: No   Drug use: No   Sexual activity: Not Currently    Birth control/protection: Surgical  Other Topics Concern   Not on file  Social History Narrative   REG EXERCISE   WIDOW   LIVES ALONE   END OF LIFE:PATIENT HAS LIVING WILL AND CLEARLY STATES SHE DOES NOT WANT CARDIAC RESUSCITATION,MECHANICLA VENTILATION OR OTHER HEROIC OR FUTILE MEASURES.   Social Drivers of Corporate investment banker Strain: Low Risk  (03/12/2023)   Overall Financial Resource Strain (CARDIA)    Difficulty of Paying Living Expenses: Not hard at all  Food Insecurity: No Food Insecurity (03/12/2023)   Hunger Vital Sign    Worried About Running Out of Food in the Last Year: Never true    Ran Out of Food in the Last Year: Never true  Transportation Needs: No Transportation Needs (03/12/2023)   PRAPARE - Administrator, Civil Service (Medical): No    Lack of Transportation  (Non-Medical): No  Physical Activity: Sufficiently Active (03/12/2023)   Exercise Vital Sign    Days of Exercise per Week: 5 days    Minutes of Exercise per Session: 30 min  Stress: No Stress Concern Present (03/12/2023)   [employer] of Occupational Health - Occupational Stress Questionnaire    Feeling of Stress : Only a little  Social Connections: Moderately Isolated (03/12/2023)   Social Connection and Isolation Panel [NHANES]    Frequency of Communication with Friends and Family: More than three times a week    Frequency of Social Gatherings with Friends and Family: More than three times  a week    Attends Religious Services: More than 4 times per year    Active Member of Clubs or Organizations: No    Attends Banker Meetings: Never    Marital Status: Widowed  Intimate Partner Violence: Not At Risk (03/16/2023)   Humiliation, Afraid, Rape, and Kick questionnaire    Fear of Current or Ex-Partner: No    Emotionally Abused: No    Physically Abused: No    Sexually Abused: No   Family Status  Relation Name Status   Father  Deceased   Other fam hx of Alive   Mother  Deceased   MGM  Deceased   MGF  Deceased   PGM  Deceased   PGF  Deceased   Sister  (Not Specified)   Other neice Alive   Daughter Bonita Quin (Not Specified)   Daughter Harriett Sine (Not Specified)   Neg Hx  (Not Specified)  No partnership data on file   Family History  Problem Relation Age of Onset   Parkinsonism Father    Hypertension Other    Heart disease Other        CAD.Marland Kitchen1ST DEGREE RELATIVE <60   Hyperlipidemia Other    Breast cancer Sister    Breast cancer Other    Arthritis Daughter    Vision loss Daughter    Early death Daughter    Hypertension Daughter    Colon cancer Neg Hx    Esophageal cancer Neg Hx    Rectal cancer Neg Hx    Stomach cancer Neg Hx    Allergies  Allergen Reactions   Zofran [Ondansetron Hcl] Anaphylaxis and Other (See Comments)    Tongue swelling, lost of voice    Clarithromycin     Pt does not remember the reaction   Codeine     Per the pt, "It made me disoriented"   Diazepam Other (See Comments)    Per the pt, "I don't remember the reaction"   Sulfonamide Derivatives     Per the pt, "It made me disoriented"   Prilosec [Omeprazole Magnesium] Rash   Trazodone Anxiety      Review of Systems  Constitutional:  Positive for weight loss. Negative for fever and malaise/fatigue.  HENT:  Negative for congestion.   Eyes:  Negative for blurred vision.  Respiratory:  Negative for cough and shortness of breath.   Cardiovascular:  Negative for chest pain, palpitations and leg swelling.  Gastrointestinal:  Negative for vomiting.  Musculoskeletal:  Negative for back pain.  Skin:  Negative for rash.  Neurological:  Negative for loss of consciousness, weakness and headaches.  Psychiatric/Behavioral:  Negative for depression. The patient has insomnia. The patient is not nervous/anxious.       Objective:     BP 126/60 (BP Location: Left Arm, Patient Position: Sitting, Cuff Size: Small)   Pulse 84   Temp 97.8 F (36.6 C) (Oral)   Resp 16   Ht 5' 2.5" (1.588 m)   Wt 87 lb 9.6 oz (39.7 kg)   BMI 15.77 kg/m  BP Readings from Last 3 Encounters:  08/07/23 126/60  04/16/23 110/60  04/13/23 116/60   Wt Readings from Last 3 Encounters:  08/07/23 87 lb 9.6 oz (39.7 kg)  01/05/23 87 lb (39.5 kg)  07/01/22 82 lb 6.4 oz (37.4 kg)   SpO2 Readings from Last 3 Encounters:  04/16/23 98%  04/13/23 97%  01/05/23 99%      Physical Exam Vitals and nursing note reviewed.  Constitutional:  General: She is not in acute distress.    Appearance: Normal appearance. She is well-developed.  HENT:     Head: Normocephalic and atraumatic.     Right Ear: Tympanic membrane, ear canal and external ear normal. There is no impacted cerumen.     Left Ear: Tympanic membrane, ear canal and external ear normal. There is no impacted cerumen.     Nose: Nose normal.      Mouth/Throat:     Mouth: Mucous membranes are moist.     Pharynx: Oropharynx is clear. No oropharyngeal exudate or posterior oropharyngeal erythema.  Eyes:     General: No scleral icterus.       Right eye: No discharge.        Left eye: No discharge.     Conjunctiva/sclera: Conjunctivae normal.     Pupils: Pupils are equal, round, and reactive to light.  Neck:     Thyroid: No thyromegaly or thyroid tenderness.     Vascular: No JVD.  Cardiovascular:     Rate and Rhythm: Normal rate and regular rhythm.     Heart sounds: Normal heart sounds. No murmur heard. Pulmonary:     Effort: Pulmonary effort is normal. No respiratory distress.     Breath sounds: Normal breath sounds.  Abdominal:     General: Bowel sounds are normal. There is no distension.     Palpations: Abdomen is soft. There is no mass.     Tenderness: There is no abdominal tenderness. There is no guarding or rebound.  Musculoskeletal:        General: Normal range of motion.     Cervical back: Normal range of motion and neck supple.     Right lower leg: No edema.     Left lower leg: No edema.  Lymphadenopathy:     Cervical: No cervical adenopathy.  Skin:    General: Skin is warm and dry.     Findings: No erythema or rash.  Neurological:     Mental Status: She is alert and oriented to person, place, and time.     Cranial Nerves: No cranial nerve deficit.     Deep Tendon Reflexes: Reflexes are normal and symmetric.  Psychiatric:        Mood and Affect: Mood normal.        Behavior: Behavior normal.        Thought Content: Thought content normal.        Judgment: Judgment normal.      No results found for any visits on 08/07/23.  Last CBC Lab Results  Component Value Date   WBC 6.0 01/05/2023   HGB 11.9 (L) 01/05/2023   HCT 35.0 (L) 01/05/2023   MCV 91.4 01/05/2023   MCH 30.7 11/04/2020   RDW 13.0 01/05/2023   PLT 353.0 01/05/2023   Last metabolic panel Lab Results  Component Value Date   GLUCOSE 90  01/05/2023   NA 136 01/05/2023   K 4.0 01/05/2023   CL 95 (L) 01/05/2023   CO2 29 01/05/2023   BUN 11 01/05/2023   CREATININE 1.11 01/05/2023   GFR 43.09 (L) 01/05/2023   CALCIUM 9.8 01/05/2023   PROT 6.6 01/05/2023   ALBUMIN 4.0 01/05/2023   BILITOT 0.5 01/05/2023   ALKPHOS 71 01/05/2023   AST 33 01/05/2023   ALT 24 01/05/2023   ANIONGAP 9 11/04/2020   Last lipids Lab Results  Component Value Date   CHOL 170 01/05/2023   HDL 71.80 01/05/2023   LDLCALC 84  01/05/2023   LDLDIRECT 165.1 05/10/2008   TRIG 68.0 01/05/2023   CHOLHDL 2 01/05/2023   Last hemoglobin A1c Lab Results  Component Value Date   HGBA1C 5.8 10/27/2013   Last thyroid functions Lab Results  Component Value Date   TSH 1.15 01/05/2023   T4TOTAL 10.9 07/01/2022   Last vitamin D Lab Results  Component Value Date   VD25OH 86.48 07/01/2022   Last vitamin B12 and Folate Lab Results  Component Value Date   VITAMINB12 >1500 (H) 07/01/2022   FOLATE >20.0 ng/mL 05/22/2010      The ASCVD Risk score (Arnett DK, et al., 2019) failed to calculate for the following reasons:   The 2019 ASCVD risk score is only valid for ages 38 to 35   Risk score cannot be calculated because patient has a medical history suggesting prior/existing ASCVD    Assessment & Plan:   Problem List Items Addressed This Visit       Unprioritized   Osteoarthritis of multiple joints   Relevant Medications   tiZANidine (ZANAFLEX) 2 MG tablet   traMADol (ULTRAM) 50 MG tablet   RLS (restless legs syndrome)   Relevant Medications   rOPINIRole (REQUIP) 1 MG tablet   Insomnia   Relevant Medications   mirtazapine (REMERON) 15 MG tablet   Preventative health care - Primary   Ghm utd  Check labs  See AVS Health Maintenance  Topic Date Due   COVID-19 Vaccine (6 - 2024-25 season) 08/23/2023 (Originally 04/26/2023)   Zoster Vaccines- Shingrix (1 of 2) 11/05/2023 (Originally 04/21/1947)   INFLUENZA VACCINE  11/23/2023 (Originally  03/26/2023)   Medicare Annual Wellness (AWV)  03/15/2024   DTaP/Tdap/Td (3 - Td or Tdap) 04/12/2033   Pneumonia Vaccine 84+ Years old  Completed   DEXA SCAN  Completed   HPV VACCINES  Aged Out         Hypothyroidism   Check labs        Relevant Medications   levothyroxine (SYNTHROID) 50 MCG tablet   Hyperlipidemia   Encourage heart healthy diet such as MIND or DASH diet, increase exercise, avoid trans fats, simple carbohydrates and processed foods, consider a krill or fish or flaxseed oil cap daily.        Relevant Medications   furosemide (LASIX) 40 MG tablet   simvastatin (ZOCOR) 20 MG tablet   Other Relevant Orders   CBC with Differential/Platelet   Comprehensive metabolic panel   Lipid panel   TSH   VITAMIN D 25 Hydroxy (Vit-D Deficiency, Fractures)   Vitamin B12   Other Visit Diagnoses       Anxiety       Relevant Medications   ALPRAZolam (XANAX) 0.5 MG tablet   mirtazapine (REMERON) 15 MG tablet     Kidney function abnormal       Relevant Medications   furosemide (LASIX) 40 MG tablet     Hypokalemia       Relevant Medications   potassium chloride SA (KLOR-CON M) 20 MEQ tablet     Low back pain, unspecified back pain laterality, unspecified chronicity, unspecified whether sciatica present       Relevant Medications   tiZANidine (ZANAFLEX) 2 MG tablet   traMADol (ULTRAM) 50 MG tablet   mirtazapine (REMERON) 15 MG tablet     Rhinitis, unspecified type       Relevant Medications   promethazine (PHENERGAN) 25 MG tablet     Vitamin D deficiency       Relevant Orders  VITAMIN D 25 Hydroxy (Vit-D Deficiency, Fractures)   Vitamin B12     Assessment and Plan    Chronic Pain Management   She experiences chronic pain likely due to arthritis and previous back surgeries, currently managed with tramadol and tizanidine for pain and muscle spasms. We discussed the risks of long-term opioid use, including dependency and tolerance, alongside the benefits of continued  pain management. Considering her past experiences, alternative therapies such as physical therapy and non-opioid pain relievers were deemed less effective. We will refill tramadol and tizanidine.  Insomnia   She reports difficulty sleeping and was previously prescribed temazepam, which is not covered by her insurance at the 7.5 mg dose typically recommended for elderly patients. She is managing costs with [facility]. We discussed the risks of higher doses, including increased sedation and potential for dependency, against the benefits of improved sleep. She prefers temazepam, so we will prescribe it at a higher dose covered by insurance.  Chronic Allergic Rhinitis   She has chronic allergies and uses Flonase nasal spray, having not been tested for allergies due to age and preference. We discussed the benefits of continued Flonase use for symptom management and the risks of untreated allergic rhinitis, which could lead to chronic sinusitis. We will refill Flonase.  General Health Maintenance   She has not received this year's flu shot due to a previous adverse reaction and has not received the shingles vaccine. We discussed the potential benefits of the flu shot in preventing influenza and the shingles vaccine in preventing herpes zoster, alongside the risks of adverse reactions. She prefers to avoid these vaccines at this time. We will discuss the potential benefits and risks of the flu shot and shingles vaccine at the next visit.  Follow-up   We will schedule a follow-up appointment.        Return in about 6 months (around 02/05/2024).    Donato Schultz, DO

## 2023-08-07 NOTE — Assessment & Plan Note (Signed)
Ghm utd  Check labs  See AVS Health Maintenance  Topic Date Due   COVID-19 Vaccine (6 - 2024-25 season) 08/23/2023 (Originally 04/26/2023)   Zoster Vaccines- Shingrix (1 of 2) 11/05/2023 (Originally 04/21/1947)   INFLUENZA VACCINE  11/23/2023 (Originally 03/26/2023)   Medicare Annual Wellness (AWV)  03/15/2024   DTaP/Tdap/Td (3 - Td or Tdap) 04/12/2033   Pneumonia Vaccine 38+ Years old  Completed   DEXA SCAN  Completed   HPV VACCINES  Aged Out

## 2023-08-10 ENCOUNTER — Encounter: Payer: Self-pay | Admitting: Family Medicine

## 2023-08-11 ENCOUNTER — Other Ambulatory Visit: Payer: Self-pay | Admitting: Family Medicine

## 2023-08-11 DIAGNOSIS — F419 Anxiety disorder, unspecified: Secondary | ICD-10-CM

## 2023-08-11 DIAGNOSIS — M159 Polyosteoarthritis, unspecified: Secondary | ICD-10-CM

## 2023-08-11 MED ORDER — TRAMADOL HCL 50 MG PO TABS: 50.0000 mg | ORAL_TABLET | Freq: Four times a day (QID) | ORAL | 1 refills | Status: DC | PRN

## 2023-08-11 MED ORDER — ALPRAZOLAM 0.5 MG PO TABS: 0.5000 mg | ORAL_TABLET | Freq: Two times a day (BID) | ORAL | 0 refills | Status: DC | PRN

## 2023-08-14 ENCOUNTER — Other Ambulatory Visit: Payer: Self-pay | Admitting: Family Medicine

## 2023-08-14 ENCOUNTER — Telehealth: Payer: Self-pay | Admitting: Family Medicine

## 2023-08-14 ENCOUNTER — Encounter: Payer: Self-pay | Admitting: Family Medicine

## 2023-08-14 DIAGNOSIS — M159 Polyosteoarthritis, unspecified: Secondary | ICD-10-CM

## 2023-08-14 MED ORDER — TRAMADOL HCL 50 MG PO TABS: 50.0000 mg | ORAL_TABLET | Freq: Four times a day (QID) | ORAL | 0 refills | Status: DC | PRN

## 2023-08-14 MED ORDER — TRAMADOL HCL 50 MG PO TABS: 50.0000 mg | ORAL_TABLET | Freq: Four times a day (QID) | ORAL | 1 refills | Status: DC | PRN

## 2023-08-14 NOTE — Telephone Encounter (Signed)
Tramadol insurance will only cover a seven day supply for patient [facility]    kim from [facility] would like a call back [phone]

## 2023-08-14 NOTE — Telephone Encounter (Signed)
Please send new rx

## 2023-08-17 ENCOUNTER — Other Ambulatory Visit: Payer: Self-pay

## 2023-08-17 DIAGNOSIS — D649 Anemia, unspecified: Secondary | ICD-10-CM

## 2023-08-17 NOTE — Telephone Encounter (Signed)
Spoke with patient daughter Bonita Quin, on [place])  Verbalized understanding.   I have reorder labs per lowne. Patient would like to do these at the [facility] lab.

## 2023-08-25 DIAGNOSIS — H524 Presbyopia: Secondary | ICD-10-CM | POA: Diagnosis not present

## 2023-09-16 ENCOUNTER — Encounter: Payer: Self-pay | Admitting: Family Medicine

## 2023-09-16 DIAGNOSIS — D649 Anemia, unspecified: Secondary | ICD-10-CM

## 2023-09-17 NOTE — Telephone Encounter (Signed)
Looks like CBC w/ Diff and IBC was ordered. Do I need to order anything else?

## 2023-09-23 ENCOUNTER — Other Ambulatory Visit: Payer: Self-pay | Admitting: Family Medicine

## 2023-09-23 ENCOUNTER — Other Ambulatory Visit (INDEPENDENT_AMBULATORY_CARE_PROVIDER_SITE_OTHER): Payer: Medicare Other

## 2023-09-23 DIAGNOSIS — D649 Anemia, unspecified: Secondary | ICD-10-CM

## 2023-09-23 LAB — CBC WITH DIFFERENTIAL/PLATELET
Basophils Absolute: 0 10*3/uL (ref 0.0–0.1)
Basophils Relative: 0.8 % (ref 0.0–3.0)
Eosinophils Absolute: 0 10*3/uL (ref 0.0–0.7)
Eosinophils Relative: 0.5 % (ref 0.0–5.0)
HCT: 27.4 % — ABNORMAL LOW (ref 36.0–46.0)
Hemoglobin: 9.1 g/dL — ABNORMAL LOW (ref 12.0–15.0)
Lymphocytes Relative: 24.6 % (ref 12.0–46.0)
Lymphs Abs: 1.1 10*3/uL (ref 0.7–4.0)
MCHC: 33.1 g/dL (ref 30.0–36.0)
MCV: 90 fL (ref 78.0–100.0)
Monocytes Absolute: 0.4 10*3/uL (ref 0.1–1.0)
Monocytes Relative: 8.9 % (ref 3.0–12.0)
Neutro Abs: 2.9 10*3/uL (ref 1.4–7.7)
Neutrophils Relative %: 65.2 % (ref 43.0–77.0)
Platelets: 314 10*3/uL (ref 150.0–400.0)
RBC: 3.05 Mil/uL — ABNORMAL LOW (ref 3.87–5.11)
RDW: 14.2 % (ref 11.5–15.5)
WBC: 4.5 10*3/uL (ref 4.0–10.5)

## 2023-09-23 LAB — IBC + FERRITIN
Ferritin: 135.7 ng/mL (ref 10.0–291.0)
Iron: 33 ug/dL — ABNORMAL LOW (ref 42–145)
Saturation Ratios: 11.2 % — ABNORMAL LOW (ref 20.0–50.0)
TIBC: 295.4 ug/dL (ref 250.0–450.0)
Transferrin: 211 mg/dL — ABNORMAL LOW (ref 212.0–360.0)

## 2023-09-25 ENCOUNTER — Encounter: Payer: Self-pay | Admitting: Family Medicine

## 2023-09-29 ENCOUNTER — Other Ambulatory Visit: Payer: Self-pay | Admitting: Family Medicine

## 2023-09-29 DIAGNOSIS — D649 Anemia, unspecified: Secondary | ICD-10-CM

## 2023-09-29 NOTE — Addendum Note (Signed)
Addended by: Seabron Spates R on: 09/29/2023 05:01 PM   Modules accepted: Orders

## 2023-10-07 ENCOUNTER — Other Ambulatory Visit: Payer: Self-pay | Admitting: Nurse Practitioner

## 2023-10-07 DIAGNOSIS — D649 Anemia, unspecified: Secondary | ICD-10-CM

## 2023-10-07 NOTE — Progress Notes (Unsigned)
New Hematology/Oncology Consult   Requesting MD: Dr. Loreen Freud  [phone]      Reason for Consult: Anemia  HPI: Ms. [name] is a 89 year old woman referred for further evaluation of anemia.  She saw Dr. Laury Axon for an annual exam on 08/07/2023.  CBC showed hemoglobin 9.8, MCV 91, white count 5.9, platelet count 389,000; chemistry panel unremarkable.  Repeat CBC 09/23/2023 showed hemoglobin 9.1, ferritin 135, iron 33, transferrin 211, saturation 11%.  Previous CBCs-01/05/2023 hemoglobin 11.9; 07/01/2022 hemoglobin 12.4; 06/25/2021 hemoglobin 12; 02/26/2021 hemoglobin 12.4; 11/23/2020 hemoglobin 12.6; 11/04/2020 hemoglobin 11.1; 09/27/2020 hemoglobin 12.3.  She has taken oral iron once daily for many years.  When her hemoglobin fell recently she increased to twice daily.  She is not aware of any bleeding.  She notes increased fatigue over the past few months.  She has dyspnea on exertion.  She reports seeing Dr. Truett Perna around 2012 also for anemia.  Office note from 08/01/2011-history of anemia secondary to renal insufficiency.  She previously received erythropoietin therapy.   She had a bone marrow biopsy on 04/18/2008 with findings of a normocellular marrow with trilineage hematopoiesis and maturation; small lymphoid aggregates; flow cytometry showed no evidence of a monoclonal B-cell population or aberrant T-cell expression.   Past Medical History:  Diagnosis Date   Allergy    Anemia    Anxiety    Cataract 05/2011   Chronic low blood pressure    due to dehydration   Depression    Esophageal disorder    Esophageal yeast infection (HCC)    GERD (gastroesophageal reflux disease)    Hearing loss    hearing aids   Hyperlipidemia    Hypothyroidism    Myocardial infarction [facility])    Osteoporosis    Renal cell carcinoma (HCC)    found on CT   Thyroid disease    hypothyroidism   Transfusion history    last 2004     Past Surgical History:  Procedure Laterality Date   ABDOMINAL  HYSTERECTOMY     APPENDECTOMY     BLEPHAROPLASTY Right    x 3 (all related to basal cell carcinoma)   COLONOSCOPY     CORONARY ARTERY BYPASS GRAFT     x2 vessel bypass -Dr. Excell Seltzer -[facility] L[facility] 06-22-14   EUS N/A 02/04/2018   Procedure: UPPER ENDOSCOPIC ULTRASOUND (EUS) RADIAL;  Surgeon: Rachael Fee, MD;  Location: [facility] ENDOSCOPY;  Service: Endoscopy;  Laterality: N/A;   EYE SURGERY     FINE NEEDLE ASPIRATION N/A 02/04/2018   Procedure: FINE NEEDLE ASPIRATION (FNA) LINEAR;  Surgeon: Rachael Fee, MD;  Location: [facility] ENDOSCOPY;  Service: Endoscopy;  Laterality: N/A;   FRACTURE SURGERY     IR GENERIC HISTORICAL  08/29/2014   IR RADIOLOGIST EVAL & MGMT 08/29/2014 Irish Lack, MD [facility] INTERV RAD   IR GENERIC HISTORICAL  10/22/2016   IR RADIOLOGIST EVAL & MGMT 10/22/2016 Irish Lack, MD [facility] INTERV RAD   IR RADIOLOGIST EVAL & MGMT  11/03/2017   IR RADIOLOGIST EVAL & MGMT  02/15/2019   KYPHOSIS SURGERY     obstructed ureter Left    repair-operative   RENAL CRYOABLATION Right 09/2014   TONSILLECTOMY     TOTAL ABDOMINAL HYSTERECTOMY W/ BILATERAL SALPINGOOPHORECTOMY       Current Outpatient Medications:    ALPRAZolam (XANAX) 0.5 MG tablet, Take 1 tablet (0.5 mg total) by mouth 2 (two) times daily as needed. for anxiety, Disp: 180 tablet, Rfl: 0   aspirin 81 MG EC tablet, Take  81 mg by mouth daily., Disp: , Rfl:    Cholecalciferol (VITAMIN D3) 25 MCG (1000 UT) CAPS, Take 1 capsule (1,000 Units total) by mouth daily., Disp: 60 capsule, Rfl: 3   ferrous sulfate 324 MG TBEC, Take 324 mg by mouth 2 (two) times daily., Disp: , Rfl:    fluticasone (FLONASE) 50 MCG/ACT nasal spray, Place 2 sprays into both nostrils daily as needed for allergies or rhinitis., Disp: 48 g, Rfl: 3   folic acid (FOLVITE) 1 MG tablet, Take 1 tablet (1 mg total) by mouth daily., Disp: 90 tablet, Rfl: 3   furosemide (LASIX) 40 MG tablet, Take 1 tablet (40 mg total) by mouth daily as needed., Disp: 90 tablet,  Rfl: 3   hydroxypropyl methylcellulose / hypromellose (ISOPTO TEARS / GONIOVISC) 2.5 % ophthalmic solution, Place 1 drop into both eyes daily as needed for dry eyes., Disp: , Rfl:    ibuprofen (ADVIL) 200 MG tablet, Take 400 mg by mouth every 6 (six) hours as needed for mild pain., Disp: , Rfl:    levothyroxine (SYNTHROID) 50 MCG tablet, Take 1 tablet (50 mcg total) by mouth daily., Disp: 90 tablet, Rfl: 3   Multiple Vitamin (MULTIVITAMIN WITH MINERALS) TABS tablet, Take 1 tablet by mouth daily., Disp: , Rfl:    oxymetazoline (AFRIN) 0.05 % nasal spray, Place 1 spray into both nostrils daily as needed for congestion., Disp: , Rfl:    potassium chloride SA (KLOR-CON M) 20 MEQ tablet, Take 1 tablet (20 mEq total) by mouth 2 (two) times daily., Disp: 180 tablet, Rfl: 3   rOPINIRole (REQUIP) 1 MG tablet, Take 1 tablet (1 mg total) by mouth at bedtime., Disp: 90 tablet, Rfl: 3   simvastatin (ZOCOR) 20 MG tablet, Take 1 tablet (20 mg total) by mouth daily., Disp: 90 tablet, Rfl: 3   tiZANidine (ZANAFLEX) 2 MG tablet, Take 1 tablet (2 mg total) by mouth daily as needed for muscle spasms., Disp: 90 tablet, Rfl: 0   traMADol (ULTRAM) 50 MG tablet, Take 1 tablet (50 mg total) by mouth every 6 (six) hours as needed. for pain, Disp: 120 tablet, Rfl: 1   levofloxacin (LEVAQUIN) 500 MG tablet, Take 1 tablet (500 mg total) by mouth daily for 7 days., Disp: 10 tablet, Rfl: 0:     Allergies  Allergen Reactions   Zofran [Ondansetron Hcl] Anaphylaxis and Other (See Comments)    Tongue swelling, lost of voice   Clarithromycin     Pt does not remember the reaction   Codeine     Per the pt, "It made me disoriented"   Diazepam Other (See Comments)    Per the pt, "I don't remember the reaction"   Sulfonamide Derivatives     Per the pt, "It made me disoriented"   Prilosec [Omeprazole Magnesium] Rash   Trazodone Anxiety    FH: Mother deceased with heart disease, hypertension.  Father deceased Parkinson's.   Brother deceased kidney failure.  Sister died after a fall, subdural hematoma.  Sister deceased breast cancer; maternal niece breast cancer; maternal great niece "breast cancer gene".  SOCIAL HISTORY: She lives in [place].  Her daughter lives close by.  She is retired.  Very remote tobacco use.  No EtOH use.  Review of Systems: No fevers or sweats.  No weight loss.  Appetite is stable.  No bleeding.  She reports recent dyspnea on exertion.  Energy level has been poor for the past year.  She eats a regular diet.  She does  not crave ice.  No tongue soreness.  Nails are brittle.  No change in bowel habits.  No bloody or black stools.  No urinary symptoms.  No nausea or vomiting.  She has difficulty swallowing potassium pills.  No numbness or tingling in the hands or feet.  Physical Exam:  Blood pressure 130/66, pulse 75, temperature 98.1 F (36.7 C), temperature source Temporal, resp. rate 18, height 5' 2.5" (1.588 m), weight 92 lb 14.4 oz (42.1 kg), SpO2 100%.  HEENT: No thrush or ulcers. Lungs: Lungs clear bilaterally. Cardiac: Regular rate and rhythm. Abdomen: No hepatosplenomegaly. Vascular: No significant leg edema. Lymph nodes: Shotty bilateral inguinal nodes. Neurologic: Alert and oriented. Skin: Brown discoloration over the lower leg bilaterally left greater than right. Musculoskeletal: Kyphosis.  LABS:   Recent Labs    10/08/23 1341  WBC 5.7  HGB 9.2*  HCT 28.5*  PLT 279  Peripheral blood smear-platelets normal in number; white cell morphology unremarkable, mature mononuclear cells and neutrophils, no blasts, few bands; moderate number of ovalocytes, few teardrops, few acanthocytes, polychromasia not increased, no nucleated red cells.  No results for input(s): "NA", "K", "CL", "CO2", "GLUCOSE", "BUN", "CREATININE", "CALCIUM" in the last 72 hours.    RADIOLOGY:  No results found.  Assessment and Plan:   Anemia Seen by Dr. Truett Perna 2009-2012, bone marrow biopsy on  04/18/2008 with findings of a normocellular marrow with trilineage hematopoiesis and maturation; small lymphoid aggregates; flow cytometry showed no evidence of a monoclonal B-cell population or aberrant T-cell expression. Anemia felt to be secondary to renal insufficiency, received erythropoietin therapy Recurrent anemia December 2024 Osteoporosis Restless legs Hypercholesterolemia History of heart surgery Osteoarthritis History of cystic lesions at head of the pancreas 10/06/2014: Cryoablation of right renal mass  Ms. Gauger was referred for evaluation of recurrent anemia.  The peripheral blood smear is abnormal.  We discussed the possibility of myelodysplasia.  We discussed proceeding with a bone marrow biopsy versus a trial of erythropoietin.  She and her daughter prefer a trial of erythropoietin.  We reviewed potential side effects including hypertension, increased risk of blood clots/strokes.  She agrees to proceed if she is a candidate.  We are obtaining additional laboratory evaluation including an erythropoietin level.  We will contact them once results are available.  She will return for lab and follow-up in 4 weeks.  Patient seen with Dr. Truett Perna.  Lonna Cobb, NP 10/08/2023, 3:12 PM  This was a shared visit with Lonna Cobb.  Ms. Chancellor was interviewed and examined.  I reviewed the peripheral blood smear.  She is referred for evaluation of anemia.  She has a remote history of anemia and underwent a nondiagnostic bone marrow biopsy in 2009.  She received erythropoietin therapy.  The anemia corrected.  She was last seen in the hematology clinic in 2012.  She has developed progressive anemia over the past several months.  The etiology of the anemia is unclear.  There is significant variation in Red cell morphology on the blood smear today.  She does not appear to have iron deficiency.  We have a low clinical suspicion for a lymphoproliferative disorder.  She most likely has a  myelodysplastic or myeloproliferative disorder.  She is symptomatic from the anemia. We checked an erythropoietin level today.  If the erythropoietin is not markedly elevated the plan is to begin a trial of erythropoietin therapy.  We reviewed risk associated with erythropoietin therapy including the chance of thromboembolic disease.  She will be referred for a repeat bone  marrow biopsy if the anemia does not respond to erythropoietin.  I was present for greater than 50% of today's visit.  I performed medical decision making.  Mancel Bale, MD

## 2023-10-08 ENCOUNTER — Encounter: Payer: Self-pay | Admitting: Nurse Practitioner

## 2023-10-08 ENCOUNTER — Telehealth: Payer: Self-pay | Admitting: Oncology

## 2023-10-08 ENCOUNTER — Inpatient Hospital Stay: Payer: Medicare Other

## 2023-10-08 ENCOUNTER — Inpatient Hospital Stay: Payer: Medicare Other | Attending: Nurse Practitioner | Admitting: Nurse Practitioner

## 2023-10-08 VITALS — BP 130/66 | HR 75 | Temp 98.1°F | Resp 18 | Ht 62.5 in | Wt 92.9 lb

## 2023-10-08 DIAGNOSIS — G2581 Restless legs syndrome: Secondary | ICD-10-CM

## 2023-10-08 DIAGNOSIS — D649 Anemia, unspecified: Secondary | ICD-10-CM | POA: Insufficient documentation

## 2023-10-08 DIAGNOSIS — D631 Anemia in chronic kidney disease: Secondary | ICD-10-CM | POA: Insufficient documentation

## 2023-10-08 DIAGNOSIS — E7800 Pure hypercholesterolemia, unspecified: Secondary | ICD-10-CM

## 2023-10-08 DIAGNOSIS — N189 Chronic kidney disease, unspecified: Secondary | ICD-10-CM | POA: Insufficient documentation

## 2023-10-08 DIAGNOSIS — M810 Age-related osteoporosis without current pathological fracture: Secondary | ICD-10-CM

## 2023-10-08 DIAGNOSIS — M1990 Unspecified osteoarthritis, unspecified site: Secondary | ICD-10-CM

## 2023-10-08 LAB — CMP (CANCER CENTER ONLY)
ALT: 22 U/L (ref 0–44)
AST: 32 U/L (ref 15–41)
Albumin: 3.6 g/dL (ref 3.5–5.0)
Alkaline Phosphatase: 63 U/L (ref 38–126)
Anion gap: 7 (ref 5–15)
BUN: 13 mg/dL (ref 8–23)
CO2: 27 mmol/L (ref 22–32)
Calcium: 9.8 mg/dL (ref 8.9–10.3)
Chloride: 98 mmol/L (ref 98–111)
Creatinine: 0.92 mg/dL (ref 0.44–1.00)
GFR, Estimated: 58 mL/min — ABNORMAL LOW (ref >60)
Glucose, Bld: 103 mg/dL — ABNORMAL HIGH (ref 70–99)
Potassium: 5.2 mmol/L — ABNORMAL HIGH (ref 3.5–5.1)
Sodium: 132 mmol/L — ABNORMAL LOW (ref 135–145)
Total Bilirubin: 0.3 mg/dL (ref 0.0–1.2)
Total Protein: 5.9 g/dL — ABNORMAL LOW (ref 6.5–8.1)

## 2023-10-08 LAB — CBC WITH DIFFERENTIAL (CANCER CENTER ONLY)
Abs Immature Granulocytes: 0.01 10*3/uL (ref 0.00–0.07)
Basophils Absolute: 0 10*3/uL (ref 0.0–0.1)
Basophils Relative: 0 %
Eosinophils Absolute: 0 10*3/uL (ref 0.0–0.5)
Eosinophils Relative: 0 %
HCT: 28.5 % — ABNORMAL LOW (ref 36.0–46.0)
Hemoglobin: 9.2 g/dL — ABNORMAL LOW (ref 12.0–15.0)
Immature Granulocytes: 0 %
Lymphocytes Relative: 26 %
Lymphs Abs: 1.5 10*3/uL (ref 0.7–4.0)
MCH: 29.3 pg (ref 26.0–34.0)
MCHC: 32.3 g/dL (ref 30.0–36.0)
MCV: 90.8 fL (ref 80.0–100.0)
Monocytes Absolute: 0.5 10*3/uL (ref 0.1–1.0)
Monocytes Relative: 9 %
Neutro Abs: 3.7 10*3/uL (ref 1.7–7.7)
Neutrophils Relative %: 65 %
Platelet Count: 279 10*3/uL (ref 150–400)
RBC: 3.14 MIL/uL — ABNORMAL LOW (ref 3.87–5.11)
RDW: 13.5 % (ref 11.5–15.5)
WBC Count: 5.7 10*3/uL (ref 4.0–10.5)
nRBC: 0 % (ref 0.0–0.2)

## 2023-10-08 LAB — LACTATE DEHYDROGENASE: LDH: 153 U/L (ref 98–192)

## 2023-10-08 LAB — IRON AND TIBC
Iron: 27 ug/dL — ABNORMAL LOW (ref 28–170)
Saturation Ratios: 8 % — ABNORMAL LOW (ref 10.4–31.8)
TIBC: 332 ug/dL (ref 250–450)
UIBC: 305 ug/dL

## 2023-10-08 LAB — RETICULOCYTES
Immature Retic Fract: 5.7 % (ref 2.3–15.9)
RBC.: 3.17 MIL/uL — ABNORMAL LOW (ref 3.87–5.11)
Retic Count, Absolute: 37.1 10*3/uL (ref 19.0–186.0)
Retic Ct Pct: 1.2 % (ref 0.4–3.1)

## 2023-10-08 LAB — SAVE SMEAR(SSMR), FOR PROVIDER SLIDE REVIEW

## 2023-10-08 LAB — FERRITIN: Ferritin: 147 ng/mL (ref 11–307)

## 2023-10-08 NOTE — Telephone Encounter (Signed)
Left patient a vm regarding upcoming appointment

## 2023-10-09 DIAGNOSIS — D631 Anemia in chronic kidney disease: Secondary | ICD-10-CM | POA: Diagnosis not present

## 2023-10-09 DIAGNOSIS — N189 Chronic kidney disease, unspecified: Secondary | ICD-10-CM | POA: Diagnosis not present

## 2023-10-09 LAB — ERYTHROPOIETIN: Erythropoietin: 16.5 m[IU]/mL (ref 2.6–18.5)

## 2023-10-11 DIAGNOSIS — D631 Anemia in chronic kidney disease: Secondary | ICD-10-CM | POA: Diagnosis not present

## 2023-10-11 DIAGNOSIS — N189 Chronic kidney disease, unspecified: Secondary | ICD-10-CM | POA: Diagnosis not present

## 2023-10-12 ENCOUNTER — Telehealth: Payer: Self-pay

## 2023-10-12 LAB — MULTIPLE MYELOMA PANEL, SERUM
Albumin SerPl Elph-Mcnc: 3.4 g/dL (ref 2.9–4.4)
Albumin/Glob SerPl: 1.4 (ref 0.7–1.7)
Alpha 1: 0.3 g/dL (ref 0.0–0.4)
Alpha2 Glob SerPl Elph-Mcnc: 0.7 g/dL (ref 0.4–1.0)
B-Globulin SerPl Elph-Mcnc: 0.9 g/dL (ref 0.7–1.3)
Gamma Glob SerPl Elph-Mcnc: 0.6 g/dL (ref 0.4–1.8)
Globulin, Total: 2.5 g/dL (ref 2.2–3.9)
IgA: 154 mg/dL (ref 64–422)
IgG (Immunoglobin G), Serum: 765 mg/dL (ref 586–1602)
IgM (Immunoglobulin M), Srm: 58 mg/dL (ref 26–217)
Total Protein ELP: 5.9 g/dL — ABNORMAL LOW (ref 6.0–8.5)

## 2023-10-12 NOTE — Telephone Encounter (Signed)
Patient's daughter gave verbal understanding and had no further questions or concerns

## 2023-10-12 NOTE — Telephone Encounter (Signed)
-----   Message from Lonna Cobb sent at 10/09/2023  3:48 PM EST ----- Please let her and daughter know the epo level is low--this indicates she is a candidate for the injection. She will need lab and injection appts 2/21, 3/7.

## 2023-10-13 ENCOUNTER — Other Ambulatory Visit: Payer: Self-pay | Admitting: Oncology

## 2023-10-13 DIAGNOSIS — D631 Anemia in chronic kidney disease: Secondary | ICD-10-CM | POA: Diagnosis not present

## 2023-10-13 DIAGNOSIS — N189 Chronic kidney disease, unspecified: Secondary | ICD-10-CM | POA: Diagnosis not present

## 2023-10-15 ENCOUNTER — Other Ambulatory Visit: Payer: Self-pay

## 2023-10-15 DIAGNOSIS — D649 Anemia, unspecified: Secondary | ICD-10-CM

## 2023-10-16 ENCOUNTER — Other Ambulatory Visit: Payer: Self-pay | Admitting: Nurse Practitioner

## 2023-10-16 ENCOUNTER — Inpatient Hospital Stay: Payer: Medicare Other

## 2023-10-16 VITALS — BP 130/62 | HR 77 | Temp 97.8°F | Resp 16

## 2023-10-16 DIAGNOSIS — N189 Chronic kidney disease, unspecified: Secondary | ICD-10-CM | POA: Diagnosis not present

## 2023-10-16 DIAGNOSIS — D631 Anemia in chronic kidney disease: Secondary | ICD-10-CM

## 2023-10-16 DIAGNOSIS — D649 Anemia, unspecified: Secondary | ICD-10-CM

## 2023-10-16 LAB — CBC WITH DIFFERENTIAL (CANCER CENTER ONLY)
Abs Immature Granulocytes: 0.02 10*3/uL (ref 0.00–0.07)
Basophils Absolute: 0 10*3/uL (ref 0.0–0.1)
Basophils Relative: 1 %
Eosinophils Absolute: 0 10*3/uL (ref 0.0–0.5)
Eosinophils Relative: 1 %
HCT: 31.3 % — ABNORMAL LOW (ref 36.0–46.0)
Hemoglobin: 9.9 g/dL — ABNORMAL LOW (ref 12.0–15.0)
Immature Granulocytes: 0 %
Lymphocytes Relative: 30 %
Lymphs Abs: 1.9 10*3/uL (ref 0.7–4.0)
MCH: 28.5 pg (ref 26.0–34.0)
MCHC: 31.6 g/dL (ref 30.0–36.0)
MCV: 90.2 fL (ref 80.0–100.0)
Monocytes Absolute: 0.5 10*3/uL (ref 0.1–1.0)
Monocytes Relative: 7 %
Neutro Abs: 3.8 10*3/uL (ref 1.7–7.7)
Neutrophils Relative %: 61 %
Platelet Count: 293 10*3/uL (ref 150–400)
RBC: 3.47 MIL/uL — ABNORMAL LOW (ref 3.87–5.11)
RDW: 13.4 % (ref 11.5–15.5)
WBC Count: 6.2 10*3/uL (ref 4.0–10.5)
nRBC: 0 % (ref 0.0–0.2)

## 2023-10-16 LAB — OCCULT BLOOD X 1 CARD TO LAB, STOOL
Fecal Occult Bld: POSITIVE — AB
Fecal Occult Bld: POSITIVE — AB
Fecal Occult Bld: POSITIVE — AB

## 2023-10-16 MED ORDER — EPOETIN ALFA-EPBX 10000 UNIT/ML IJ SOLN
10000.0000 [IU] | Freq: Once | INTRAMUSCULAR | Status: AC
  Administered 2023-10-16: 10000 [IU] via SUBCUTANEOUS
  Filled 2023-10-16: qty 1

## 2023-10-16 NOTE — Patient Instructions (Signed)

## 2023-10-21 ENCOUNTER — Other Ambulatory Visit: Payer: Self-pay

## 2023-10-21 DIAGNOSIS — D649 Anemia, unspecified: Secondary | ICD-10-CM

## 2023-10-22 ENCOUNTER — Telehealth: Payer: Self-pay

## 2023-10-22 NOTE — Telephone Encounter (Signed)
 The patient's daughter has indicated her understanding of the situation and mentioned that she is a patient at [facility]. She will be contacting the provider to schedule an appointment.

## 2023-10-22 NOTE — Telephone Encounter (Signed)
-----   Message from Lonna Cobb sent at 10/22/2023  8:33 AM EST ----- Please let her know stool cards are positive for blood.  Does she have a GI provider?  If so, please make a referral (anemia, heme positive stool).  If she is not established with GI please ask her if she has a preference and then make the referral.  Thanks

## 2023-10-23 ENCOUNTER — Other Ambulatory Visit (INDEPENDENT_AMBULATORY_CARE_PROVIDER_SITE_OTHER): Payer: Medicare Other

## 2023-10-23 ENCOUNTER — Encounter: Payer: Self-pay | Admitting: Nurse Practitioner

## 2023-10-23 ENCOUNTER — Ambulatory Visit: Payer: Medicare Other | Admitting: Nurse Practitioner

## 2023-10-23 VITALS — BP 124/68 | HR 90 | Ht 62.0 in | Wt 90.0 lb

## 2023-10-23 DIAGNOSIS — R1032 Left lower quadrant pain: Secondary | ICD-10-CM | POA: Diagnosis not present

## 2023-10-23 DIAGNOSIS — D509 Iron deficiency anemia, unspecified: Secondary | ICD-10-CM

## 2023-10-23 DIAGNOSIS — Z85528 Personal history of other malignant neoplasm of kidney: Secondary | ICD-10-CM

## 2023-10-23 DIAGNOSIS — R1310 Dysphagia, unspecified: Secondary | ICD-10-CM | POA: Diagnosis not present

## 2023-10-23 DIAGNOSIS — K5909 Other constipation: Secondary | ICD-10-CM | POA: Diagnosis not present

## 2023-10-23 DIAGNOSIS — R195 Other fecal abnormalities: Secondary | ICD-10-CM | POA: Diagnosis not present

## 2023-10-23 LAB — BASIC METABOLIC PANEL
BUN: 24 mg/dL — ABNORMAL HIGH (ref 6–23)
CO2: 25 meq/L (ref 19–32)
Calcium: 9.4 mg/dL (ref 8.4–10.5)
Chloride: 96 meq/L (ref 96–112)
Creatinine, Ser: 1.45 mg/dL — ABNORMAL HIGH (ref 0.40–1.20)
GFR: 31.09 mL/min — ABNORMAL LOW (ref >60.00)
Glucose, Bld: 98 mg/dL (ref 70–99)
Potassium: 4.2 meq/L (ref 3.5–5.1)
Sodium: 130 meq/L — ABNORMAL LOW (ref 135–145)

## 2023-10-23 NOTE — Progress Notes (Addendum)
 10/23/2023 [name] [id number] 01-May-1928   CHIEF COMPLAINT: Anemia, hemoccult positive stool  HISTORY OF PRESENT ILLNESS: [name]. [name] is a 89 year old female with a past medical history of anxiety, depression, CAD on ASA 81mg  QD, renal cell cancer s/p cryoablation 09/2014, GERD and colon polyps. She presents to our office today as referred by Lonna Cobb hematology NP for further evaluation regarding IDA with heme positive stool cards. Previously known by Dr. Christella Hartigan. She is accompanied by her daughter.  She underwent routine laboratory studies by her PCP 08/07/2023 which showed a hemoglobin level of 9.8 (Hg 11.9 on 01/05/2023) and MCV 91.  Repeat CBC 09/23/2023 showed a hemoglobin level of 9.1, Iron 33, Iron saturation 11% and ferritin 135.    It was noted, the patient had previously seen Dr. Truett Perna 2009 - [id number] for an anemia, bone marrow biopsy 04/18/2008 was nondiagnostic. Her anemia at that time was felt to be secondary to renal insufficiency treated with Erythropoietin.  She was recently seen by Lonna Cobb hematology NP 10/08/2023 for further evaluation regarding recurrent progressive IDA.  A bone marrow biopsy verses a trial of Erythropoietin were discussed and the patient and daughter elected to initiate Erythropoietin.  Dr. Truett Perna reviewed the blood smear done in office and noted significant variation in red cell morphology and he felt she did not appear to have iron deficiency anemia and there was low suspicion for lymphoproliferative disorder and it was felt she most likely has a myelodysplastic or myeloproliferative disorder. To consider a repeat bone marrow biopsy if her anemia does not respond to Erythropoietin.  She completed 3 stool cards which resulted + 2/14, 2/16 and 2/18 therefore further GI evaluation was recommended.  She denies having any nausea or vomiting.  No heartburn.  She sometimes has difficulty swallowing food if it is not ground up or if she does not chew  really well which has been a chronic issue for many years.  She has difficulty swallowing her potassium tablet which is large.  No difficulty swallowing liquids.  She has chronic constipation and stated having difficulty passing stool from the rectum every since she had a hemorrhoidectomy 60 years ago.  She takes Dulcolax 2 tabs twice weekly which resulted in passing 1-2 loose stools most days.  No bright red blood per the rectum or black stools.  She is on oral iron for many years, recently increased to twice daily.  No Pepto-Bismol use.  She takes ASA 81 mg daily, no other NSAID use.  She has a history of GERD.  Her most recent EGD was 07/2012 which identified candidiasis esophagitis treated with Diflucan, mild to moderate pan gastritis without evidence of H. pylori.  She underwent a EUS due to prior imaging identifying two small pancreatic cyst, cytology was negative for malignancy and she was found to have recurrent candidiasis esophagitis treated with a course of Diflucan.  Her most recent colonoscopy was 08/2010 which was done at the age of 30 due fecal occult positive stool, one 14 mm adenomatous polyp was removed from the colon.  No further colon polyp surveillance colonoscopies were recommended due to age.  No known family history of colon polyps or colorectal cancer.  She denies having any fevers. Her weight is low at 90 pounds which she stated was stable.  She was last seen in office by Dr. Christella Hartigan secondary to heme positive stool with slight anemia 07/10/2020.  She is assessed to be at increased risk for any invasive  endoscopic evaluation and the patient and daughter elected to monitor her blood counts routinely with her PCP.  Review of pertinent gastrointestinal problems:  1. Adenomatous polyp: Colonoscopy January 2012 (age 78) done for fecal occult positive stool, 14 mm adenoma was removed in piecemeal fashion. I felt was completely resected and did not require any further surveillance given her  age.  2. GERD: Had allergic (welts, rash) reaction to prilosec ; EGD 07/2012 Esophageal yeast infection Mild to moderate pan-gastritis, biopsied to check for H. pylori The examination was otherwise normal; biopsies showed chronic gastritis, no H. Pylori. May, 2014 doing well on twice daily H2 blocker, bedtime Reglan. 02/2013 called with recurrent dysphagia, diflucan restarted empirically.  12/2013 intermittent diflucan treatment helps her swallowing.  She started soaking her dentures in nystatin every 7-10 days. EUS 01/2018 found yeast esophagitis again. 3. Incidental pancreatic cyst: No concerning morphologic features based on 2015 AGA criteria. Regardless, EUS 01/2018 found two innocent appearing pancreatic head cysts, one was sampled and fluid testing showed: CEA 640 ng/mL (elevated)  Amylase 286 U/L  Cytology: no sign of cancer. Recommended continued imaging surveillance.     Latest Ref Rng & Units 10/16/2023    9:57 AM 10/08/2023    1:41 PM 09/23/2023   12:32 PM  CBC  WBC 4.0 - 10.5 K/uL 6.2  5.7  4.5   Hemoglobin 12.0 - 15.0 g/dL 9.9  9.2  9.1   Hematocrit 36.0 - 46.0 % 31.3  28.5  27.4   Platelets 150 - 400 K/uL 293  279  314.0        Latest Ref Rng & Units 10/08/2023    3:40 PM 08/07/2023    9:42 AM 01/05/2023    9:00 AM  CMP  Glucose 70 - 99 mg/dL 409  87  90   BUN 8 - 23 mg/dL 13  15  11    Creatinine 0.44 - 1.00 mg/dL 8.11  9.14  7.82   Sodium 135 - 145 mmol/L 132  136  136   Potassium 3.5 - 5.1 mmol/L 5.2  4.7  4.0   Chloride 98 - 111 mmol/L 98  98  95   CO2 22 - 32 mmol/L 27  27  29    Calcium 8.9 - 10.3 mg/dL 9.8  9.4  9.8   Total Protein 6.5 - 8.1 g/dL 5.9  6.1  6.6   Total Bilirubin 0.0 - 1.2 mg/dL 0.3  0.4  0.5   Alkaline Phos 38 - 126 U/L 63  73  71   AST 15 - 41 U/L 32  26  33   ALT 0 - 44 U/L 22  20  24      Iron 27. Saturation ratios 8. Feritin 147.  Abdominal MRI 11/16/2020: FINDINGS: Lower chest: Unremarkable.   Hepatobiliary: No suspicious cystic or solid hepatic  lesions. No intra or extrahepatic biliary ductal dilatation noted on MRCP images. No filling defects in the common bile duct to suggest choledocholithiasis. Common bile duct is normal in caliber measuring 5 mm in the porta hepatis. Small filling defect near the fundus of the gallbladder, compatible with a gallstone. Gallbladder is not distended. No gallbladder wall thickening or surrounding pericholecystic fluid to suggest an acute cholecystitis at this time.   Pancreas: In the anterior aspect of the head of the pancreas there are multiple T1 hypointense, T2 hyperintense, nonenhancing lesions, largest of which measures 1.4 cm in diameter (axial image 19 of series 26). These do not appear to communicate with the  main pancreatic duct. No solid enhancing pancreatic mass identified. No pancreatic ductal dilatation noted on MRCP images. No peripancreatic fluid collections or inflammatory changes are otherwise noted.   Spleen:  Unremarkable.   Adrenals/Urinary Tract: Numerous small T1 hypointense, T2 hyperintense, nonenhancing lesions are noted in both kidneys compatible with simple cysts, largest of which is exophytic in the lateral aspect of the upper pole of the right kidney (axial image 22 of series 26) measuring 1.4 cm in diameter. No suspicious renal lesions are noted on postcontrast images. No hydroureteronephrosis in the visualized portions of the abdomen. Bilateral adrenal glands are normal in appearance.   Stomach/Bowel: Visualized portions are unremarkable.   Vascular/Lymphatic: No aneurysm identified in the visualized abdominal vasculature. No lymphadenopathy noted in the abdomen.   Other: No significant volume of ascites noted in the visualized portions of the peritoneal cavity.   Musculoskeletal: No aggressive appearing osseous lesions are noted in the visualized portions of the skeleton.   IMPRESSION: 1. Multiple small cystic lesions in the head of the pancreas.  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 Repeat abdominal MRI with and without IV gadolinium with MRCP is recommended in 1 year to ensure the stability of this finding  Barium swallow study 12/11/2020: No evidence of esophageal mass or stricture.   Severe esophageal dysmotility.   Transient retention of 13 mm barium tablet noted in the right hypopharynx, but this subsequently passed freely through the esophagus into the stomach. Consider speech pathology consultation to further evaluate this finding.  Past Medical History:  Diagnosis Date   Allergy    Anemia    Anxiety    Cataract 05/2011   Chronic low blood pressure    due to dehydration   Depression    Esophageal disorder    Esophageal yeast infection (HCC)    GERD (gastroesophageal reflux disease)    Hearing loss    hearing aids   Hyperlipidemia    Hypothyroidism    Myocardial infarction [facility])    Osteoporosis    Renal cell carcinoma (HCC)    found on CT   Thyroid disease    hypothyroidism   Transfusion history    last 2004   Past Surgical History:  Procedure Laterality Date   ABDOMINAL HYSTERECTOMY     APPENDECTOMY     BLEPHAROPLASTY Right    x 3 (all related to basal cell carcinoma)   COLONOSCOPY     CORONARY ARTERY BYPASS GRAFT     x2 vessel bypass -Dr. Excell Seltzer -[facility] LOV 06-22-14   EUS N/A 02/04/2018   Procedure: UPPER ENDOSCOPIC ULTRASOUND (EUS) RADIAL;  Surgeon: Rachael Fee, MD;  Location: [facility] ENDOSCOPY;  Service: Endoscopy;  Laterality: N/A;   EYE SURGERY     FINE NEEDLE ASPIRATION N/A 02/04/2018   Procedure: FINE NEEDLE ASPIRATION (FNA) LINEAR;  Surgeon: Rachael Fee, MD;  Location: [facility] ENDOSCOPY;  Service: Endoscopy;  Laterality: N/A;   FRACTURE SURGERY     IR GENERIC HISTORICAL  08/29/2014   IR RADIOLOGIST EVAL & MGMT 08/29/2014 Irish Lack, MD [facility] INTERV RAD   IR GENERIC HISTORICAL  10/22/2016    IR RADIOLOGIST EVAL & MGMT 10/22/2016 Irish Lack, MD [facility] INTERV RAD   IR RADIOLOGIST EVAL & MGMT  11/03/2017   IR RADIOLOGIST EVAL & MGMT  02/15/2019   KYPHOSIS SURGERY     obstructed ureter Left    repair-operative   RENAL CRYOABLATION Right 09/2014   TONSILLECTOMY  TOTAL ABDOMINAL HYSTERECTOMY W/ BILATERAL SALPINGOOPHORECTOMY      Allergies  Allergen Reactions   Zofran [Ondansetron Hcl] Anaphylaxis and Other (See Comments)    Tongue swelling, lost of voice   Clarithromycin     Pt does not remember the reaction   Codeine     Per the pt, "It made me disoriented"   Diazepam Other (See Comments)    Per the pt, "I don't remember the reaction"   Sulfonamide Derivatives     Per the pt, "It made me disoriented"   Prilosec [Omeprazole Magnesium] Rash   Trazodone Anxiety      Outpatient Encounter Medications as of 10/23/2023  Medication Sig   ALPRAZolam (XANAX) 0.5 MG tablet Take 1 tablet (0.5 mg total) by mouth 2 (two) times daily as needed. for anxiety   aspirin 81 MG EC tablet Take 81 mg by mouth daily.   Cholecalciferol (VITAMIN D3) 25 MCG (1000 UT) CAPS Take 1 capsule (1,000 Units total) by mouth daily.   ferrous sulfate 324 MG TBEC Take 324 mg by mouth 2 (two) times daily.   fluticasone (FLONASE) 50 MCG/ACT nasal spray Place 2 sprays into both nostrils daily as needed for allergies or rhinitis.   folic acid (FOLVITE) 1 MG tablet Take 1 tablet (1 mg total) by mouth daily.   furosemide (LASIX) 40 MG tablet Take 1 tablet (40 mg total) by mouth daily as needed.   hydroxypropyl methylcellulose / hypromellose (ISOPTO TEARS / GONIOVISC) 2.5 % ophthalmic solution Place 1 drop into both eyes daily as needed for dry eyes.   ibuprofen (ADVIL) 200 MG tablet Take 400 mg by mouth every 6 (six) hours as needed for mild pain.   levothyroxine (SYNTHROID) 50 MCG tablet Take 1 tablet (50 mcg total) by mouth daily.   Multiple Vitamin (MULTIVITAMIN WITH MINERALS) TABS tablet Take 1 tablet by  mouth daily.   oxymetazoline (AFRIN) 0.05 % nasal spray Place 1 spray into both nostrils daily as needed for congestion.   potassium chloride SA (KLOR-CON M) 20 MEQ tablet Take 1 tablet (20 mEq total) by mouth 2 (two) times daily.   rOPINIRole (REQUIP) 1 MG tablet Take 1 tablet (1 mg total) by mouth at bedtime.   simvastatin (ZOCOR) 20 MG tablet Take 1 tablet (20 mg total) by mouth daily.   tiZANidine (ZANAFLEX) 2 MG tablet Take 1 tablet (2 mg total) by mouth daily as needed for muscle spasms.   traMADol (ULTRAM) 50 MG tablet Take 1 tablet (50 mg total) by mouth every 6 (six) hours as needed. for pain   levofloxacin (LEVAQUIN) 500 MG tablet Take 1 tablet (500 mg total) by mouth daily for 7 days.   No facility-administered encounter medications on file as of 10/23/2023.   REVIEW OF SYSTEMS:  Gen: + Fatigue. Denies fever, sweats or chills. No weight loss.  CV: Denies chest pain, palpitations or edema. Resp: Denies cough, shortness of breath of hemoptysis.  GI:See HPI.  GU: Denies urinary burning, blood in urine, increased urinary frequency or incontinence. MS: Denies joint pain, muscles aches or weakness. Derm: Denies rash, itchiness, skin lesions or unhealing ulcers. Psych: Denies depression, anxiety, memory loss or confusion. Heme: Denies bruising, easy bleeding. Neuro:  Denies headaches, dizziness or paresthesias. Endo:  Denies any problems with DM, thyroid or adrenal function.  PHYSICAL EXAM: Ht 5\' 2"  (1.575 m)   Wt 90 lb (40.8 kg)   BMI 16.46 kg/m  Wt Readings from Last 3 Encounters:  10/23/23 90 lb (40.8 kg)  10/08/23 92 lb 14.4 oz (42.1 kg)  08/07/23 87 lb 9.6 oz (39.7 kg)    General: 89 year old female frail and cachectic appearing in no acute distress. Head: Normocephalic and atraumatic. Eyes:  Sclerae non-icteric, conjunctive pink. Ears: Normal auditory acuity. Mouth: Dentition intact. No ulcers or lesions.  No oral thrush. Neck: Supple, no lymphadenopathy or  thyromegaly.  Lungs: Clear bilaterally to auscultation without wheezes, crackles or rhonchi. Heart: Regular rate and rhythm. No murmur, rub or gallop appreciated.  Abdomen: Soft, nondistended.  Mild LLQ tenderness without rebound or guarding.  No masses. No hepatosplenomegaly. Normoactive bowel sounds x 4 quadrants.  Rectal: Deferred. Musculoskeletal: Symmetrical with no gross deformities. Skin: Warm and dry. No rash or lesions on visible extremities. Extremities: No edema. Neurological: Alert oriented x 4, no focal deficits.  Psychological:  Alert and cooperative. Normal mood and affect.  ASSESSMENT AND PLAN:  89 year old female with recurrent iron deficiency anemia.  Hg 9.9. No overt GI bleeding. Stool cards x 3 hemoccult positive (it should be noted the patient was on Ferrous Sulfate when the she completed the stool cards (oral iron can result in a false positive hemoccult result, oral iron should be held for one week prior to completing stool cards). She was evaluated by hematology, Dr. Truett Perna reviewed the blood smear done and reported significant variation in red cell morphology and he felt she did not appear to have iron deficiency anemia but most likely has a myelodysplastic or myeloproliferative disorder.  -Patient to continue follow-up with hematology, she may require repeat bone marrow biopsy if her blood counts do not respond to erythropoietin -No plans for invasive endoscopic evaluation -I do not recommend any further stool hemoccult testing -CTAP with oral and IV contrast to rule out any intra-abdominal/pelvic mass/pathology to explain her anemia.  BUN and creatinine level to be reviewed prior to patient receiving oral and IV contrast -BMP  History of GERD, candidiasis esophagitis and mild chronic dysphagia.  Barium swallow study 11/2020 showed severe esophageal dysmotility with transient retention of the barium tablet noted in the right hypopharynx then passed freely through the  esophagus into the stomach.  Dysphagia is stable as long as she cuts her food into small pieces and chews her food thoroughly.  Allergic to PPI. -Continue to cut food into small pieces, chew food thoroughly -Patient/daughter to contact office if dysphagia symptoms worsen  History of renal cell carcinoma status post ablation 09/2014 -CTAP as ordered above to also rule out recurrence of renal cell carcinoma  Chronic constipation -MiraLAX nightly if tolerated -Okay to take Dulcolax 2 tabs twice weekly as needed  History of colon polyps.  Colonoscopy 08/2010 identified one 14 mm adenomatous polyp removed from the colon.  History of pancreatic cysts, cytology negative per EUS 01/2018.     CC:  Donato Schultz, *   I have reviewed the clinic note as outlined by Alcide Evener, NP and agree with the assessment, plan and medical decision making.  Ms. Caudill is referred to the office for iron deficiency anemia and 3 positive Hemoccult tests.  Last colonoscopy was performed in 2012 and revealed a 14 mm adenomatous polyp.  She has some symptoms of dysphagia but these are stable since last barium swallow was performed.  Given age, medical comorbidities and frailty (90 lbs), I concur that it would be appropriate to defer invasive testing.  Reasonable to perform a CT scan of the abdomen and pelvis to evaluate for GI pathology that would contribute to anemia.  Continue dysphagia diet.  Maren Beach, MD

## 2023-10-23 NOTE — Patient Instructions (Signed)
 You have been scheduled for a CT scan of the abdomen and pelvis at [facility], 1st floor Radiology. You are scheduled on 11/09/23 at 12:00 pm. You should arrive 2 hours prior to your appointment time for registration. ( Arrive by 10:00 am)  The purpose of you drinking the oral contrast is to aid in the visualization of your intestinal tract. The contrast solution may cause some diarrhea. Depending on your individual set of symptoms, you may also receive an intravenous injection of x-ray contrast/dye. Plan on being at [facility] for 45 minutes or longer, depending on the type of exam you are having performed.   If you have any questions regarding your exam or if you need to reschedule, you may call [facility] at [phone] between the hours of 8:00 am and 5:00 pm, Monday-Friday.    Continue to follow up with your hematologist to monitor your blood counts and iron.  Your provider has requested that you go to the basement level for lab work before leaving today. Press "B" on the elevator. The lab is located at the first door on the left as you exit the elevator.  Due to recent changes in healthcare laws, you may see the results of your imaging and laboratory studies on MyChart before your provider has had a chance to review them.  We understand that in some cases there may be results that are confusing or concerning to you. Not all laboratory results come back in the same time frame and the provider may be waiting for multiple results in order to interpret others.  Please give [place] 48 hours in order for your provider to thoroughly review all the results before contacting the office for clarification of your results.   Thank you for trusting me with your gastrointestinal care!   Alcide Evener, CRNP

## 2023-10-30 ENCOUNTER — Encounter: Payer: Self-pay | Admitting: Family Medicine

## 2023-10-30 ENCOUNTER — Inpatient Hospital Stay: Payer: Medicare Other

## 2023-10-30 ENCOUNTER — Inpatient Hospital Stay: Payer: Medicare Other | Attending: Nurse Practitioner

## 2023-10-30 VITALS — BP 159/57 | HR 73 | Temp 98.4°F | Resp 18

## 2023-10-30 DIAGNOSIS — F419 Anxiety disorder, unspecified: Secondary | ICD-10-CM

## 2023-10-30 DIAGNOSIS — I129 Hypertensive chronic kidney disease with stage 1 through stage 4 chronic kidney disease, or unspecified chronic kidney disease: Secondary | ICD-10-CM | POA: Insufficient documentation

## 2023-10-30 DIAGNOSIS — D649 Anemia, unspecified: Secondary | ICD-10-CM

## 2023-10-30 DIAGNOSIS — N189 Chronic kidney disease, unspecified: Secondary | ICD-10-CM | POA: Diagnosis not present

## 2023-10-30 DIAGNOSIS — M159 Polyosteoarthritis, unspecified: Secondary | ICD-10-CM

## 2023-10-30 DIAGNOSIS — D631 Anemia in chronic kidney disease: Secondary | ICD-10-CM | POA: Diagnosis not present

## 2023-10-30 LAB — CBC WITH DIFFERENTIAL (CANCER CENTER ONLY)
Abs Immature Granulocytes: 0.01 10*3/uL (ref 0.00–0.07)
Basophils Absolute: 0 10*3/uL (ref 0.0–0.1)
Basophils Relative: 1 %
Eosinophils Absolute: 0 10*3/uL (ref 0.0–0.5)
Eosinophils Relative: 1 %
HCT: 30.2 % — ABNORMAL LOW (ref 36.0–46.0)
Hemoglobin: 9.9 g/dL — ABNORMAL LOW (ref 12.0–15.0)
Immature Granulocytes: 0 %
Lymphocytes Relative: 34 %
Lymphs Abs: 2 10*3/uL (ref 0.7–4.0)
MCH: 29 pg (ref 26.0–34.0)
MCHC: 32.8 g/dL (ref 30.0–36.0)
MCV: 88.6 fL (ref 80.0–100.0)
Monocytes Absolute: 0.6 10*3/uL (ref 0.1–1.0)
Monocytes Relative: 10 %
Neutro Abs: 3.2 10*3/uL (ref 1.7–7.7)
Neutrophils Relative %: 54 %
Platelet Count: 246 10*3/uL (ref 150–400)
RBC: 3.41 MIL/uL — ABNORMAL LOW (ref 3.87–5.11)
RDW: 13.8 % (ref 11.5–15.5)
WBC Count: 5.9 10*3/uL (ref 4.0–10.5)
nRBC: 0 % (ref 0.0–0.2)

## 2023-10-30 MED ORDER — EPOETIN ALFA-EPBX 10000 UNIT/ML IJ SOLN
10000.0000 [IU] | Freq: Once | INTRAMUSCULAR | Status: AC
  Administered 2023-10-30: 10000 [IU] via SUBCUTANEOUS
  Filled 2023-10-30: qty 1

## 2023-10-30 MED ORDER — ALPRAZOLAM 0.5 MG PO TABS: 0.5000 mg | ORAL_TABLET | Freq: Two times a day (BID) | ORAL | 0 refills | Status: DC | PRN

## 2023-10-30 MED ORDER — TRAMADOL HCL 50 MG PO TABS: 50.0000 mg | ORAL_TABLET | Freq: Four times a day (QID) | ORAL | 1 refills | Status: DC | PRN

## 2023-10-30 MED ORDER — TIZANIDINE HCL 2 MG PO TABS: 2.0000 mg | ORAL_TABLET | Freq: Every day | ORAL | 0 refills | Status: DC | PRN

## 2023-10-30 NOTE — Telephone Encounter (Signed)
 Requesting: Xanax Contract: 12/2021 UDS: 12/2021 Last OV: 08/07/23 Next OV: 02/01/24 Last Refill: 08/11/2023, #[id number] RF Database:   Requesting: Tramadol Contract: UDS: Last OV: Next OV: Last Refill: 08/14/23, #120--1 RF Database:   Please advise

## 2023-10-30 NOTE — Patient Instructions (Signed)

## 2023-11-02 DIAGNOSIS — H353131 Nonexudative age-related macular degeneration, bilateral, early dry stage: Secondary | ICD-10-CM | POA: Diagnosis not present

## 2023-11-02 DIAGNOSIS — H40013 Open angle with borderline findings, low risk, bilateral: Secondary | ICD-10-CM | POA: Diagnosis not present

## 2023-11-02 DIAGNOSIS — D3131 Benign neoplasm of right choroid: Secondary | ICD-10-CM | POA: Diagnosis not present

## 2023-11-04 ENCOUNTER — Encounter: Payer: Self-pay | Admitting: Nurse Practitioner

## 2023-11-04 ENCOUNTER — Inpatient Hospital Stay: Payer: Medicare Other | Admitting: Nurse Practitioner

## 2023-11-04 ENCOUNTER — Inpatient Hospital Stay: Payer: Medicare Other

## 2023-11-04 VITALS — BP 119/51 | HR 73 | Temp 98.2°F | Resp 18 | Ht 62.0 in | Wt 92.0 lb

## 2023-11-04 DIAGNOSIS — I129 Hypertensive chronic kidney disease with stage 1 through stage 4 chronic kidney disease, or unspecified chronic kidney disease: Secondary | ICD-10-CM | POA: Diagnosis not present

## 2023-11-04 DIAGNOSIS — D649 Anemia, unspecified: Secondary | ICD-10-CM | POA: Diagnosis not present

## 2023-11-04 DIAGNOSIS — D631 Anemia in chronic kidney disease: Secondary | ICD-10-CM

## 2023-11-04 DIAGNOSIS — N189 Chronic kidney disease, unspecified: Secondary | ICD-10-CM | POA: Diagnosis not present

## 2023-11-04 LAB — CBC WITH DIFFERENTIAL (CANCER CENTER ONLY)
Abs Immature Granulocytes: 0.01 10*3/uL (ref 0.00–0.07)
Basophils Absolute: 0 10*3/uL (ref 0.0–0.1)
Basophils Relative: 0 %
Eosinophils Absolute: 0 10*3/uL (ref 0.0–0.5)
Eosinophils Relative: 0 %
HCT: 31.3 % — ABNORMAL LOW (ref 36.0–46.0)
Hemoglobin: 9.9 g/dL — ABNORMAL LOW (ref 12.0–15.0)
Immature Granulocytes: 0 %
Lymphocytes Relative: 23 %
Lymphs Abs: 1.6 10*3/uL (ref 0.7–4.0)
MCH: 28.6 pg (ref 26.0–34.0)
MCHC: 31.6 g/dL (ref 30.0–36.0)
MCV: 90.5 fL (ref 80.0–100.0)
Monocytes Absolute: 0.5 10*3/uL (ref 0.1–1.0)
Monocytes Relative: 7 %
Neutro Abs: 4.9 10*3/uL (ref 1.7–7.7)
Neutrophils Relative %: 70 %
Platelet Count: 267 10*3/uL (ref 150–400)
RBC: 3.46 MIL/uL — ABNORMAL LOW (ref 3.87–5.11)
RDW: 14.4 % (ref 11.5–15.5)
WBC Count: 7 10*3/uL (ref 4.0–10.5)
nRBC: 0 % (ref 0.0–0.2)

## 2023-11-04 NOTE — Progress Notes (Signed)
  [facility] OFFICE PROGRESS NOTE   Diagnosis: Anemia  INTERVAL HISTORY:   Ms. Hocevar returns as scheduled.  She completed Retacrit injections on 10/16/2023 and 10/30/2023.  She thinks her energy level may be slightly improved.  She denies bleeding.  She is taking iron twice daily.  She notes an intermittent cold sensation in the lower legs.    Objective:  Vital signs in last 24 hours:  Blood pressure (!) 119/51, pulse 73, temperature 98.2 F (36.8 C), temperature source Oral, resp. rate 18, height 5\' 2"  (1.575 m), weight 92 lb (41.7 kg), SpO2 100%.    Resp: Lungs clear bilaterally. Cardio: Regular rate and rhythm. GI: No hepatosplenomegaly. Vascular: No leg edema.  Legs are warm to touch.   Lab Results:  Lab Results  Component Value Date   WBC 7.0 11/04/2023   HGB 9.9 (L) 11/04/2023   HCT 31.3 (L) 11/04/2023   MCV 90.5 11/04/2023   PLT 267 11/04/2023   NEUTROABS 4.9 11/04/2023    Imaging:  No results found.  Medications: I have reviewed the patient's current medications.  Assessment/Plan: Anemia Seen by Dr. Truett Perna 2009-2012, bone marrow biopsy on 04/18/2008 with findings of a normocellular marrow with trilineage hematopoiesis and maturation; small lymphoid aggregates; flow cytometry showed no evidence of a monoclonal B-cell population or aberrant T-cell expression. Anemia felt to be secondary to renal insufficiency, received erythropoietin therapy Recurrent anemia December 2024 10/08/2023 erythropoietin 16.5; myeloma panel with no M spike, IFE unremarkable with no evidence of monoclonal protein, normal immunoglobulin levels Stool cards positive for occult blood x 3, referred to GI, no plan for endoscopic evaluation, referred for CTs Retacrit 10,000 units 10/16/2023, 10/30/2023 Osteoporosis Restless legs Hypercholesterolemia History of heart surgery Osteoarthritis History of cystic lesions at head of the pancreas 10/06/2014: Cryoablation of right renal  mass  Disposition: [name] appears stable.  She has completed 2 erythropoietin injections.  Hemoglobin is stable.  She will continue oral iron.  She will return for lab, follow-up and the next injection on 11/13/2023.   Lonna Cobb ANP/GNP-BC   11/04/2023  1:28 PM

## 2023-11-05 ENCOUNTER — Ambulatory Visit: Payer: Medicare Other | Admitting: Nurse Practitioner

## 2023-11-05 ENCOUNTER — Other Ambulatory Visit: Payer: Medicare Other

## 2023-11-09 ENCOUNTER — Encounter (HOSPITAL_COMMUNITY): Payer: Self-pay

## 2023-11-09 ENCOUNTER — Ambulatory Visit (HOSPITAL_COMMUNITY)
Admission: RE | Admit: 2023-11-09 | Discharge: 2023-11-09 | Disposition: A | Discharge status: Home / Self Care | Source: Ambulatory Visit | Attending: Nurse Practitioner | Admitting: Nurse Practitioner

## 2023-11-09 ENCOUNTER — Ambulatory Visit (HOSPITAL_COMMUNITY): Payer: Medicare Other

## 2023-11-09 DIAGNOSIS — K8020 Calculus of gallbladder without cholecystitis without obstruction: Secondary | ICD-10-CM | POA: Diagnosis not present

## 2023-11-09 DIAGNOSIS — D509 Iron deficiency anemia, unspecified: Secondary | ICD-10-CM

## 2023-11-09 DIAGNOSIS — R1032 Left lower quadrant pain: Secondary | ICD-10-CM

## 2023-11-09 DIAGNOSIS — N289 Disorder of kidney and ureter, unspecified: Secondary | ICD-10-CM | POA: Diagnosis not present

## 2023-11-09 DIAGNOSIS — N281 Cyst of kidney, acquired: Secondary | ICD-10-CM | POA: Diagnosis not present

## 2023-11-13 ENCOUNTER — Inpatient Hospital Stay

## 2023-11-13 ENCOUNTER — Inpatient Hospital Stay: Admitting: Nurse Practitioner

## 2023-11-13 ENCOUNTER — Encounter: Payer: Self-pay | Admitting: Nurse Practitioner

## 2023-11-13 VITALS — BP 122/66 | HR 70 | Temp 98.2°F | Resp 18 | Ht 62.0 in | Wt 89.1 lb

## 2023-11-13 DIAGNOSIS — D649 Anemia, unspecified: Secondary | ICD-10-CM

## 2023-11-13 DIAGNOSIS — N189 Chronic kidney disease, unspecified: Secondary | ICD-10-CM | POA: Diagnosis not present

## 2023-11-13 DIAGNOSIS — D631 Anemia in chronic kidney disease: Secondary | ICD-10-CM

## 2023-11-13 DIAGNOSIS — I129 Hypertensive chronic kidney disease with stage 1 through stage 4 chronic kidney disease, or unspecified chronic kidney disease: Secondary | ICD-10-CM | POA: Diagnosis not present

## 2023-11-13 LAB — CBC WITH DIFFERENTIAL (CANCER CENTER ONLY)
Abs Immature Granulocytes: 0 10*3/uL (ref 0.00–0.07)
Basophils Absolute: 0 10*3/uL (ref 0.0–0.1)
Basophils Relative: 0 %
Eosinophils Absolute: 0 10*3/uL (ref 0.0–0.5)
Eosinophils Relative: 1 %
HCT: 33.5 % — ABNORMAL LOW (ref 36.0–46.0)
Hemoglobin: 10.6 g/dL — ABNORMAL LOW (ref 12.0–15.0)
Immature Granulocytes: 0 %
Lymphocytes Relative: 29 %
Lymphs Abs: 1.7 10*3/uL (ref 0.7–4.0)
MCH: 29 pg (ref 26.0–34.0)
MCHC: 31.6 g/dL (ref 30.0–36.0)
MCV: 91.5 fL (ref 80.0–100.0)
Monocytes Absolute: 0.4 10*3/uL (ref 0.1–1.0)
Monocytes Relative: 7 %
Neutro Abs: 3.8 10*3/uL (ref 1.7–7.7)
Neutrophils Relative %: 63 %
Platelet Count: 273 10*3/uL (ref 150–400)
RBC: 3.66 MIL/uL — ABNORMAL LOW (ref 3.87–5.11)
RDW: 14.7 % (ref 11.5–15.5)
WBC Count: 5.9 10*3/uL (ref 4.0–10.5)
nRBC: 0 % (ref 0.0–0.2)

## 2023-11-13 MED ORDER — EPOETIN ALFA-EPBX 10000 UNIT/ML IJ SOLN
10000.0000 [IU] | Freq: Once | INTRAMUSCULAR | Status: AC
  Administered 2023-11-13: 10000 [IU] via SUBCUTANEOUS
  Filled 2023-11-13: qty 1

## 2023-11-13 NOTE — Progress Notes (Signed)
  [facility] OFFICE PROGRESS NOTE   Diagnosis: Anemia  INTERVAL HISTORY:   [name] returns as scheduled.  She completed Retacrit injections 10/16/2023 and 10/30/2023.  She has noted improvement in her energy level since beginning the injections.  She was very active over the weekend and thinks she "overdid it".  No shortness of breath.  No symptom of thrombosis.  She continues oral iron.  She has constipation at baseline, no worse with the iron.  She takes Dulcolax if needed.  No nausea.  Objective:  Vital signs in last 24 hours:  Blood pressure 122/66, pulse 70, temperature 98.2 F (36.8 C), resp. rate 18, height 5\' 2"  (1.575 m), weight 89 lb 1.6 oz (40.4 kg).    Resp: Lungs clear bilaterally. Cardio: Regular rate and rhythm. GI: No hepatosplenomegaly. Vascular: No leg edema.   Lab Results:  Lab Results  Component Value Date   WBC 5.9 11/13/2023   HGB 10.6 (L) 11/13/2023   HCT 33.5 (L) 11/13/2023   MCV 91.5 11/13/2023   PLT 273 11/13/2023   NEUTROABS 3.8 11/13/2023    Imaging:  No results found.  Medications: I have reviewed the patient's current medications.  Assessment/Plan: Anemia Seen by Dr. Truett Perna 2009-2012, bone marrow biopsy on 04/18/2008 with findings of a normocellular marrow with trilineage hematopoiesis and maturation; small lymphoid aggregates; flow cytometry showed no evidence of a monoclonal B-cell population or aberrant T-cell expression. Anemia felt to be secondary to renal insufficiency, received erythropoietin therapy Recurrent anemia December 2024 10/08/2023 erythropoietin 16.5; myeloma panel with no M spike, IFE unremarkable with no evidence of monoclonal protein, normal immunoglobulin levels Stool cards positive for occult blood x 3, referred to GI, no plan for endoscopic evaluation, referred for CTs Retacrit 10,000 units 10/16/2023, 10/30/2023 Osteoporosis Restless legs Hypercholesterolemia History of heart  surgery Osteoarthritis History of cystic lesions at head of the pancreas 10/06/2014: Cryoablation of right renal mass CTs 11/09/2023-cryoablation defect right upper pole kidney without definite soft tissue nodularity.  No evidence of metastatic disease in the abdomen or pelvis.  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  Cholelithiasis.  Gas/fluid levels in the colon.  Disposition: [name] appears stable.  She has completed 2 Retacrit injections on a 2-week schedule.  The hemoglobin is higher.  She will receive an injection today.  Subsequent injections will be given on a 4-week schedule.  She agrees with this plan.  She will follow-up with GI regarding recent CT imaging.  She will return for lab and injection in 4 weeks.  We will see her in follow-up in 8 weeks.  She will contact the office in the interim with any problems.    [name] ANP/GNP-BC   11/13/2023  8:14 AM

## 2023-11-13 NOTE — Patient Instructions (Signed)

## 2023-12-14 ENCOUNTER — Inpatient Hospital Stay: Attending: Nurse Practitioner

## 2023-12-14 ENCOUNTER — Inpatient Hospital Stay

## 2023-12-14 VITALS — BP 137/49 | HR 79 | Temp 99.0°F | Resp 16

## 2023-12-14 DIAGNOSIS — I129 Hypertensive chronic kidney disease with stage 1 through stage 4 chronic kidney disease, or unspecified chronic kidney disease: Secondary | ICD-10-CM | POA: Insufficient documentation

## 2023-12-14 DIAGNOSIS — D631 Anemia in chronic kidney disease: Secondary | ICD-10-CM | POA: Insufficient documentation

## 2023-12-14 DIAGNOSIS — N189 Chronic kidney disease, unspecified: Secondary | ICD-10-CM | POA: Insufficient documentation

## 2023-12-14 LAB — CBC WITH DIFFERENTIAL (CANCER CENTER ONLY)
Abs Immature Granulocytes: 0.01 10*3/uL (ref 0.00–0.07)
Basophils Absolute: 0 10*3/uL (ref 0.0–0.1)
Basophils Relative: 0 %
Eosinophils Absolute: 0 10*3/uL (ref 0.0–0.5)
Eosinophils Relative: 0 %
HCT: 28.8 % — ABNORMAL LOW (ref 36.0–46.0)
Hemoglobin: 9.5 g/dL — ABNORMAL LOW (ref 12.0–15.0)
Immature Granulocytes: 0 %
Lymphocytes Relative: 24 %
Lymphs Abs: 1.4 10*3/uL (ref 0.7–4.0)
MCH: 29.4 pg (ref 26.0–34.0)
MCHC: 33 g/dL (ref 30.0–36.0)
MCV: 89.2 fL (ref 80.0–100.0)
Monocytes Absolute: 0.4 10*3/uL (ref 0.1–1.0)
Monocytes Relative: 7 %
Neutro Abs: 4 10*3/uL (ref 1.7–7.7)
Neutrophils Relative %: 69 %
Platelet Count: 217 10*3/uL (ref 150–400)
RBC: 3.23 MIL/uL — ABNORMAL LOW (ref 3.87–5.11)
RDW: 13.6 % (ref 11.5–15.5)
WBC Count: 5.8 10*3/uL (ref 4.0–10.5)
nRBC: 0 % (ref 0.0–0.2)

## 2023-12-14 MED ORDER — EPOETIN ALFA-EPBX 10000 UNIT/ML IJ SOLN
10000.0000 [IU] | Freq: Once | INTRAMUSCULAR | Status: AC
  Administered 2023-12-14: 10000 [IU] via SUBCUTANEOUS
  Filled 2023-12-14: qty 1

## 2024-01-11 ENCOUNTER — Inpatient Hospital Stay

## 2024-01-11 ENCOUNTER — Inpatient Hospital Stay: Attending: Nurse Practitioner | Admitting: Oncology

## 2024-01-11 ENCOUNTER — Telehealth: Payer: Self-pay | Admitting: Oncology

## 2024-01-11 VITALS — BP 131/54 | HR 79 | Temp 98.1°F | Resp 18 | Ht 62.0 in | Wt 90.8 lb

## 2024-01-11 DIAGNOSIS — N189 Chronic kidney disease, unspecified: Secondary | ICD-10-CM | POA: Diagnosis not present

## 2024-01-11 DIAGNOSIS — D631 Anemia in chronic kidney disease: Secondary | ICD-10-CM | POA: Diagnosis not present

## 2024-01-11 DIAGNOSIS — I129 Hypertensive chronic kidney disease with stage 1 through stage 4 chronic kidney disease, or unspecified chronic kidney disease: Secondary | ICD-10-CM | POA: Insufficient documentation

## 2024-01-11 LAB — CBC WITH DIFFERENTIAL (CANCER CENTER ONLY)
Abs Immature Granulocytes: 0.01 10*3/uL (ref 0.00–0.07)
Basophils Absolute: 0 10*3/uL (ref 0.0–0.1)
Basophils Relative: 0 %
Eosinophils Absolute: 0 10*3/uL (ref 0.0–0.5)
Eosinophils Relative: 0 %
HCT: 28.5 % — ABNORMAL LOW (ref 36.0–46.0)
Hemoglobin: 9.3 g/dL — ABNORMAL LOW (ref 12.0–15.0)
Immature Granulocytes: 0 %
Lymphocytes Relative: 32 %
Lymphs Abs: 1.5 10*3/uL (ref 0.7–4.0)
MCH: 29.1 pg (ref 26.0–34.0)
MCHC: 32.6 g/dL (ref 30.0–36.0)
MCV: 89.1 fL (ref 80.0–100.0)
Monocytes Absolute: 0.4 10*3/uL (ref 0.1–1.0)
Monocytes Relative: 8 %
Neutro Abs: 2.8 10*3/uL (ref 1.7–7.7)
Neutrophils Relative %: 60 %
Platelet Count: 224 10*3/uL (ref 150–400)
RBC: 3.2 MIL/uL — ABNORMAL LOW (ref 3.87–5.11)
RDW: 13.8 % (ref 11.5–15.5)
WBC Count: 4.8 10*3/uL (ref 4.0–10.5)
nRBC: 0 % (ref 0.0–0.2)

## 2024-01-11 MED ORDER — EPOETIN ALFA-EPBX 10000 UNIT/ML IJ SOLN
10000.0000 [IU] | Freq: Once | INTRAMUSCULAR | Status: AC
  Administered 2024-01-11: 10000 [IU] via SUBCUTANEOUS
  Filled 2024-01-11: qty 1

## 2024-01-11 NOTE — Progress Notes (Signed)
  [facility] OFFICE PROGRESS NOTE   Diagnosis: Anemia  INTERVAL HISTORY:   [name] returns as scheduled.  She reports generalized weakness.  No dyspnea.  She was last treated with erythropoietin  on 12/14/2023.  Objective:  Vital signs in last 24 hours:  Blood pressure (!) 131/54, pulse 79, temperature 98.1 F (36.7 C), temperature source Temporal, resp. rate 18, height 5\' 2"  (1.575 m), weight 90 lb 12.8 oz (41.2 kg), SpO2 100%.     Resp: Lungs clear bilaterally Cardio: Regular rhythm GI: No hepatosplenomegaly Vascular: No leg edema   Lab Results:  Lab Results  Component Value Date   WBC 4.8 01/11/2024   HGB 9.3 (L) 01/11/2024   HCT 28.5 (L) 01/11/2024   MCV 89.1 01/11/2024   PLT 224 01/11/2024   NEUTROABS 2.8 01/11/2024    CMP  Lab Results  Component Value Date   NA 130 (L) 10/23/2023   K 4.2 10/23/2023   CL 96 10/23/2023   CO2 25 10/23/2023   GLUCOSE 98 10/23/2023   BUN 24 (H) 10/23/2023   CREATININE 1.45 (H) 10/23/2023   CALCIUM 9.4 10/23/2023   PROT 5.9 (L) 10/08/2023   ALBUMIN 3.6 10/08/2023   AST 32 10/08/2023   ALT 22 10/08/2023   ALKPHOS 63 10/08/2023   BILITOT 0.3 10/08/2023   GFRNONAA 58 (L) 10/08/2023   GFRAA 58 (L) 07/07/2018     Medications: I have reviewed the patient's current medications.   Assessment/Plan: Anemia Seen by Dr. Scherrie Curt 2009-2012, bone marrow biopsy on 04/18/2008 with findings of a normocellular marrow with trilineage hematopoiesis and maturation; small lymphoid aggregates; flow cytometry showed no evidence of a monoclonal B-cell population or aberrant T-cell expression. Anemia felt to be secondary to renal insufficiency, received erythropoietin  therapy Recurrent anemia December 2024 10/08/2023 erythropoietin  16.5; myeloma panel with no M spike, IFE unremarkable with no evidence of monoclonal protein, normal immunoglobulin levels Stool cards positive for occult blood x 3, referred to GI, no plan for  endoscopic evaluation, referred for CTs-11/09/2023: No evidence of metastatic disease, slight increase size of adjacent cystic lesions in the pancreas head Retacrit  10,000 units 10/16/2023, 10/30/2023, 11/13/2023, 12/14/2023, 01/11/2024 Retacrit  changed to 10,000 units every 2 weeks beginning 01/11/2024 Osteoporosis Restless legs Hypercholesterolemia History of heart surgery Osteoarthritis History of cystic lesions at head of the pancreas 10/06/2014: Cryoablation of right renal mass CTs 11/09/2023-cryoablation defect right upper pole kidney without definite soft tissue nodularity.  No evidence of metastatic disease in the abdomen or pelvis.  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  Cholelithiasis.  Gas/fluid levels in the colon.    Disposition: [name] returns as scheduled.  She appears unchanged.  The hemoglobin is lower since changing the erythropoietin  to an every 4-week schedule.  The plan is to return to every 2-week dosing today.  She will return for an office visit in 6 weeks.  We will increase the Retacrit  dose if the hemoglobin falls.  We will check iron levels in 4 weeks.  Lindsey Deep, MD  01/11/2024  9:23 AM

## 2024-01-11 NOTE — Telephone Encounter (Signed)
 Patient has been scheduled for follow-up visit per 01/11/24 LOS.  LVM notifying pt of appt details, provided my direct number to pt if appt changes need to be made.

## 2024-01-21 DIAGNOSIS — M62838 Other muscle spasm: Secondary | ICD-10-CM | POA: Diagnosis not present

## 2024-01-21 DIAGNOSIS — I110 Hypertensive heart disease with heart failure: Secondary | ICD-10-CM | POA: Diagnosis not present

## 2024-01-22 ENCOUNTER — Other Ambulatory Visit: Payer: Self-pay | Admitting: Family Medicine

## 2024-01-25 ENCOUNTER — Inpatient Hospital Stay

## 2024-01-25 ENCOUNTER — Inpatient Hospital Stay: Attending: Nurse Practitioner

## 2024-01-25 VITALS — BP 119/46 | HR 71 | Temp 98.1°F | Resp 16

## 2024-01-25 DIAGNOSIS — D631 Anemia in chronic kidney disease: Secondary | ICD-10-CM | POA: Diagnosis not present

## 2024-01-25 DIAGNOSIS — N189 Chronic kidney disease, unspecified: Secondary | ICD-10-CM | POA: Diagnosis not present

## 2024-01-25 DIAGNOSIS — I129 Hypertensive chronic kidney disease with stage 1 through stage 4 chronic kidney disease, or unspecified chronic kidney disease: Secondary | ICD-10-CM | POA: Insufficient documentation

## 2024-01-25 LAB — CBC WITH DIFFERENTIAL (CANCER CENTER ONLY)
Abs Immature Granulocytes: 0.01 10*3/uL (ref 0.00–0.07)
Basophils Absolute: 0 10*3/uL (ref 0.0–0.1)
Basophils Relative: 1 %
Eosinophils Absolute: 0 10*3/uL (ref 0.0–0.5)
Eosinophils Relative: 1 %
HCT: 30.3 % — ABNORMAL LOW (ref 36.0–46.0)
Hemoglobin: 9.5 g/dL — ABNORMAL LOW (ref 12.0–15.0)
Immature Granulocytes: 0 %
Lymphocytes Relative: 30 %
Lymphs Abs: 1.7 10*3/uL (ref 0.7–4.0)
MCH: 28.8 pg (ref 26.0–34.0)
MCHC: 31.4 g/dL (ref 30.0–36.0)
MCV: 91.8 fL (ref 80.0–100.0)
Monocytes Absolute: 0.4 10*3/uL (ref 0.1–1.0)
Monocytes Relative: 8 %
Neutro Abs: 3.5 10*3/uL (ref 1.7–7.7)
Neutrophils Relative %: 60 %
Platelet Count: 222 10*3/uL (ref 150–400)
RBC: 3.3 MIL/uL — ABNORMAL LOW (ref 3.87–5.11)
RDW: 14.7 % (ref 11.5–15.5)
WBC Count: 5.6 10*3/uL (ref 4.0–10.5)
nRBC: 0 % (ref 0.0–0.2)

## 2024-01-25 MED ORDER — EPOETIN ALFA-EPBX 10000 UNIT/ML IJ SOLN
10000.0000 [IU] | Freq: Once | INTRAMUSCULAR | Status: AC
  Administered 2024-01-25: 10000 [IU] via SUBCUTANEOUS
  Filled 2024-01-25: qty 1

## 2024-01-29 ENCOUNTER — Encounter: Payer: Self-pay | Admitting: Nurse Practitioner

## 2024-02-01 ENCOUNTER — Encounter: Payer: Self-pay | Admitting: Family Medicine

## 2024-02-01 ENCOUNTER — Other Ambulatory Visit: Payer: Self-pay | Admitting: Family Medicine

## 2024-02-01 ENCOUNTER — Ambulatory Visit (INDEPENDENT_AMBULATORY_CARE_PROVIDER_SITE_OTHER): Payer: Medicare Other | Admitting: Family Medicine

## 2024-02-01 VITALS — BP 139/50 | HR 77 | Temp 98.1°F | Resp 16 | Ht 62.0 in | Wt 88.4 lb

## 2024-02-01 DIAGNOSIS — E039 Hypothyroidism, unspecified: Secondary | ICD-10-CM | POA: Diagnosis not present

## 2024-02-01 DIAGNOSIS — D649 Anemia, unspecified: Secondary | ICD-10-CM | POA: Diagnosis not present

## 2024-02-01 DIAGNOSIS — N289 Disorder of kidney and ureter, unspecified: Secondary | ICD-10-CM | POA: Diagnosis not present

## 2024-02-01 DIAGNOSIS — F419 Anxiety disorder, unspecified: Secondary | ICD-10-CM

## 2024-02-01 DIAGNOSIS — E785 Hyperlipidemia, unspecified: Secondary | ICD-10-CM

## 2024-02-01 DIAGNOSIS — M159 Polyosteoarthritis, unspecified: Secondary | ICD-10-CM

## 2024-02-01 DIAGNOSIS — E559 Vitamin D deficiency, unspecified: Secondary | ICD-10-CM

## 2024-02-01 LAB — COMPREHENSIVE METABOLIC PANEL WITH GFR
ALT: 16 U/L (ref 0–35)
AST: 23 U/L (ref 0–37)
Albumin: 3.9 g/dL (ref 3.5–5.2)
Alkaline Phosphatase: 61 U/L (ref 39–117)
BUN: 13 mg/dL (ref 6–23)
CO2: 26 meq/L (ref 19–32)
Calcium: 9.1 mg/dL (ref 8.4–10.5)
Chloride: 96 meq/L (ref 96–112)
Creatinine, Ser: 0.87 mg/dL (ref 0.40–1.20)
GFR: 57.29 mL/min — ABNORMAL LOW (ref >60.00)
Glucose, Bld: 98 mg/dL (ref 70–99)
Potassium: 5.2 meq/L — ABNORMAL HIGH (ref 3.5–5.1)
Sodium: 129 meq/L — ABNORMAL LOW (ref 135–145)
Total Bilirubin: 0.4 mg/dL (ref 0.2–1.2)
Total Protein: 6.2 g/dL (ref 6.0–8.3)

## 2024-02-01 LAB — CBC WITH DIFFERENTIAL/PLATELET
Basophils Absolute: 0 10*3/uL (ref 0.0–0.1)
Basophils Relative: 0.4 % (ref 0.0–3.0)
Eosinophils Absolute: 0 10*3/uL (ref 0.0–0.7)
Eosinophils Relative: 0.2 % (ref 0.0–5.0)
HCT: 34.5 % — ABNORMAL LOW (ref 36.0–46.0)
Hemoglobin: 11.5 g/dL — ABNORMAL LOW (ref 12.0–15.0)
Lymphocytes Relative: 26 % (ref 12.0–46.0)
Lymphs Abs: 1.1 10*3/uL (ref 0.7–4.0)
MCHC: 33.2 g/dL (ref 30.0–36.0)
MCV: 87.4 fl (ref 78.0–100.0)
Monocytes Absolute: 0.4 10*3/uL (ref 0.1–1.0)
Monocytes Relative: 9.3 % (ref 3.0–12.0)
Neutro Abs: 2.6 10*3/uL (ref 1.4–7.7)
Neutrophils Relative %: 64.1 % (ref 43.0–77.0)
Platelets: 363 10*3/uL (ref 150.0–400.0)
RBC: 3.95 Mil/uL (ref 3.87–5.11)
RDW: 15.3 % (ref 11.5–15.5)
WBC: 4.1 10*3/uL (ref 4.0–10.5)

## 2024-02-01 LAB — LIPID PANEL
Cholesterol: 140 mg/dL (ref 0–200)
HDL: 60.5 mg/dL (ref >39.00)
LDL Cholesterol: 68 mg/dL (ref 0–99)
NonHDL: 79.44
Total CHOL/HDL Ratio: 2
Triglycerides: 58 mg/dL (ref 0.0–149.0)
VLDL: 11.6 mg/dL (ref 0.0–40.0)

## 2024-02-01 MED ORDER — ALPRAZOLAM 0.5 MG PO TABS: 0.5000 mg | ORAL_TABLET | Freq: Two times a day (BID) | ORAL | 0 refills | Status: DC | PRN

## 2024-02-01 MED ORDER — TRAMADOL HCL 50 MG PO TABS: 50.0000 mg | ORAL_TABLET | Freq: Four times a day (QID) | ORAL | 1 refills | Status: DC | PRN

## 2024-02-01 MED ORDER — NONFORMULARY OR COMPOUNDED ITEM: 0 refills | Status: AC

## 2024-02-01 NOTE — Assessment & Plan Note (Signed)
 Encourage heart healthy diet such as MIND or DASH diet, increase exercise, avoid trans fats, simple carbohydrates and processed foods, consider a krill or fish or flaxseed oil cap daily.

## 2024-02-01 NOTE — Assessment & Plan Note (Signed)
 This SmartLink has not been configured with any valid records.   Lab Results  Component Value Date   TSH 1.29 08/07/2023

## 2024-02-01 NOTE — Progress Notes (Signed)
 Established Patient Office Visit  Subjective   Patient ID: [name], female    DOB: 1928-01-15  Age: 89 y.o. MRN: [id number]  Chief Complaint  Patient presents with  . Hypothyroidism    Here for follow up  . Hyperlipidemia    Here for follow up  . Pruritis    Complains of itching, upper body  . Anxiety    Here for follow up, needs alprazolam  refill    HPI Discussed the use of AI scribe software for clinical note transcription with the patient, who gave verbal consent to proceed.  History of Present Illness [name] is a 89 year old female with anemia who presents with fatigue and itching after erythropoietin  injections. She is accompanied by her caregiver.  She experiences fatigue and itching following erythropoietin  injections for anemia. After receiving the shots, she feels extremely tired, sometimes sleeping for up to eighteen hours, and experiences severe itching. She uses hydrocortisone cream frequently to manage the itching, which she describes as requiring 'a whole tube' on some days. She has also noted blurry vision, which she mentioned to her healthcare provider, although she was not familiar with this side effect.  Her hemoglobin initially increased to 10 after starting the erythropoietin , but it dropped after four weeks, necessitating injections every two weeks. She is scheduled for her next injection next Monday and will have a follow-up appointment two weeks after that.  She is currently using tramadol  and alprazolam , for which she requires new prescriptions as they were not refilled. Her feet and ankles stay swollen, and a vascular doctor diagnosed her with vascular insufficiency. She has undergone various diagnostic procedures, including ultrasounds and echocardiograms, to assess her heart health.  Due to her history of eye surgeries, she has been advised against lifting heavy objects. 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   Patient Active Problem List   Diagnosis Date Noted  . Chronic obstructive pulmonary disease, unspecified COPD type (HCC) 02/26/2021  . PVD (peripheral vascular disease) (HCC) 02/26/2021  . Severe protein-calorie malnutrition (HCC) 11/26/2020  . Preventative health care 11/08/2018  . Open wound of left lower leg 07/15/2018  . Fall 07/10/2018  . Wound of left leg 07/10/2018  . Wound of right leg, initial encounter 07/10/2018  . Pancreatic mass   . Osteoarthritis of multiple joints 11/05/2017  . Atherosclerosis of native coronary artery of native heart without angina pectoris 11/05/2017  . Right renal mass   . Suspicious nevus 06/06/2013  . Allergic drug reaction 07/23/2012  . RLS (restless legs syndrome) 05/31/2012  . Insomnia 05/26/2011  . Osteoporosis 05/26/2011  . Hypothyroidism 05/10/2008  . Unspecified Anemia 03/31/2008  . HYPERCALCEMIA 03/16/2008  . UTI 03/06/2008  . Weakness generalized 03/06/2008  . Hyperlipidemia 04/12/2007  . ARTIFICIAL MENOPAUSE 04/12/2007  . Anxiety state 01/12/2007  . DEPRESSION 01/12/2007  . MYOCARDIAL INFARCTION, HX OF 01/12/2007   Past Medical History:  Diagnosis Date  . Allergy   . Anemia   . Anxiety   . Cataract 05/2011  . Chronic low blood pressure    due to dehydration  . Depression   . Esophageal disorder   . Esophageal yeast infection (HCC)   . GERD (gastroesophageal reflux disease)   . Hearing loss    hearing aids  . Hyperlipidemia   . Hypothyroidism   . Myocardial infarction (HCC)   . Osteoporosis   . Renal cell carcinoma (HCC)    found on CT  .  Thyroid  disease    hypothyroidism  . Transfusion history    last 2004   Past Surgical History:  Procedure Laterality Date  . ABDOMINAL HYSTERECTOMY    . APPENDECTOMY    . BLEPHAROPLASTY Right    x 3 (all related to basal cell carcinoma)  . COLONOSCOPY    . CORONARY ARTERY BYPASS GRAFT     x2 vessel bypass -Dr. Arlester Ladd -[facility] 06-22-14  . EUS N/A  02/04/2018   Procedure: UPPER ENDOSCOPIC ULTRASOUND (EUS) RADIAL;  Surgeon: Janel Medford, MD;  Location: [facility] ENDOSCOPY;  Service: Endoscopy;  Laterality: N/A;  . EYE SURGERY    . FINE NEEDLE ASPIRATION N/A 02/04/2018   Procedure: FINE NEEDLE ASPIRATION (FNA) LINEAR;  Surgeon: Janel Medford, MD;  Location: [facility] ENDOSCOPY;  Service: Endoscopy;  Laterality: N/A;  . FRACTURE SURGERY    . IR GENERIC HISTORICAL  08/29/2014   IR RADIOLOGIST EVAL & MGMT 08/29/2014 Erica Hau, MD [facility] INTERV RAD  . IR GENERIC HISTORICAL  10/22/2016   IR RADIOLOGIST EVAL & MGMT 10/22/2016 Erica Hau, MD [facility] INTERV RAD  . IR RADIOLOGIST EVAL & MGMT  11/03/2017  . IR RADIOLOGIST EVAL & MGMT  02/15/2019  . KYPHOSIS SURGERY    . obstructed ureter Left    repair-operative  . RENAL CRYOABLATION Right 09/2014  . TONSILLECTOMY    . TOTAL ABDOMINAL HYSTERECTOMY W/ BILATERAL SALPINGOOPHORECTOMY     Social History   Tobacco Use  . Smoking status: Former    Current packs/day: 0.00    Average packs/day: 1 pack/day for 4.0 years (4.0 ttl pk-yrs)    Types: Cigarettes    Quit date: 08/25/1958    Years since quitting: 65.4  . Smokeless tobacco: Never  Substance Use Topics  . Alcohol use: No  . Drug use: No   Social History   Socioeconomic History  . Marital status: Widowed    Spouse name: Not on file  . Number of children: 2  . Years of education: Not on file  . Highest education level: 12th grade  Occupational History  . Occupation: Retired    Associate Professor: RETIRED  Tobacco Use  . Smoking status: Former    Current packs/day: 0.00    Average packs/day: 1 pack/day for 4.0 years (4.0 ttl pk-yrs)    Types: Cigarettes    Quit date: 08/25/1958    Years since quitting: 65.4  . Smokeless tobacco: Never  Substance and Sexual Activity  . Alcohol use: No  . Drug use: No  . Sexual activity: Not Currently    Birth control/protection: Surgical  Other Topics Concern  . Not on file  Social History Narrative    REG EXERCISE   WIDOW   LIVES ALONE   END OF LIFE:PATIENT HAS LIVING WILL AND CLEARLY STATES SHE DOES NOT WANT CARDIAC RESUSCITATION,MECHANICLA VENTILATION OR OTHER HEROIC OR FUTILE MEASURES.   Social Drivers of Corporate investment banker Strain: Low Risk  (01/25/2024)   Overall Financial Resource Strain (CARDIA)   . Difficulty of Paying Living Expenses: Not hard at all  Food Insecurity: No Food Insecurity (01/25/2024)   Hunger Vital Sign   . Worried About Programme researcher, broadcasting/film/video in the Last Year: Never true   . Ran Out of Food in the Last Year: Never true  Transportation Needs: No Transportation Needs (01/25/2024)   PRAPARE - Transportation   . Lack of Transportation (Medical): No   . Lack of Transportation (Non-Medical): No  Physical Activity: Insufficiently Active (01/25/2024)  Exercise Vital Sign   . Days of Exercise per Week: 3 days   . Minutes of Exercise per Session: 10 min  Stress: No Stress Concern Present (01/25/2024)   [employer] of Occupational Health - Occupational Stress Questionnaire   . Feeling of Stress : Not at all  Social Connections: Moderately Integrated (01/25/2024)   Social Connection and Isolation Panel [NHANES]   . Frequency of Communication with Friends and Family: More than three times a week   . Frequency of Social Gatherings with Friends and Family: More than three times a week   . Attends Religious Services: 1 to 4 times per year   . Active Member of Clubs or Organizations: Yes   . Attends Banker Meetings: More than 4 times per year   . Marital Status: Widowed  Intimate Partner Violence: Not At Risk (10/08/2023)   Humiliation, Afraid, Rape, and Kick questionnaire   . Fear of Current or Ex-Partner: No   . Emotionally Abused: No   . Physically Abused: No   . Sexually Abused: No   Family Status  Relation Name Status  . Father  Deceased  . Other fam hx of Alive  . Mother  Deceased  . MGM  Deceased  . MGF  Deceased  . PGM  Deceased  . PGF   Deceased  . Sister  (Not Specified)  . Other neice Alive  . Daughter Stana Ear (Not Specified)  . Daughter Haskell Linker (Not Specified)  . Neg Hx  (Not Specified)  No partnership data on file   Family History  Problem Relation Age of Onset  . Parkinsonism Father   . Hypertension Other   . Heart disease Other        CAD.Aaron Aas1ST DEGREE RELATIVE <60  . Hyperlipidemia Other   . Breast cancer Sister   . Breast cancer Other   . Arthritis Daughter   . Vision loss Daughter   . Early death Daughter   . Hypertension Daughter   . Colon cancer Neg Hx   . Esophageal cancer Neg Hx   . Rectal cancer Neg Hx   . Stomach cancer Neg Hx    Allergies  Allergen Reactions  . Zofran  [Ondansetron  Hcl] Anaphylaxis and Other (See Comments)    Tongue swelling, lost of voice  . Clarithromycin     Pt does not remember the reaction  . Codeine     Per the pt, "It made me disoriented"  . Diazepam Other (See Comments)    Per the pt, "I don't remember the reaction"  . Sulfonamide Derivatives     Per the pt, "It made me disoriented"  . Prilosec [Omeprazole  Magnesium] Rash  . Trazodone  Anxiety      Review of Systems  Constitutional:  Negative for chills, fever and malaise/fatigue.  HENT:  Negative for congestion and hearing loss.   Eyes:  Negative for discharge.  Respiratory:  Negative for cough, sputum production and shortness of breath.   Cardiovascular:  Negative for chest pain, palpitations and leg swelling.  Gastrointestinal:  Negative for abdominal pain, blood in stool, constipation, diarrhea, heartburn, nausea and vomiting.  Genitourinary:  Negative for dysuria, frequency, hematuria and urgency.  Musculoskeletal:  Negative for back pain, falls and myalgias.  Skin:  Negative for rash.  Neurological:  Negative for dizziness, sensory change, loss of consciousness, weakness and headaches.  Endo/Heme/Allergies:  Negative for environmental allergies. Does not bruise/bleed easily.  Psychiatric/Behavioral:   Negative for depression and suicidal ideas. The patient is not nervous/anxious  and does not have insomnia.       Objective:     BP (!) 139/50 (BP Location: Left Arm, Patient Position: Sitting, Cuff Size: Small)   Pulse 77   Temp 98.1 F (36.7 C) (Oral)   Resp 16   Ht 5\' 2"  (1.575 m)   Wt 88 lb 6.4 oz (40.1 kg)   SpO2 100%   BMI 16.17 kg/m  BP Readings from Last 3 Encounters:  02/01/24 (!) 139/50  01/25/24 (!) 119/46  01/11/24 (!) 131/54   Wt Readings from Last 3 Encounters:  02/01/24 88 lb 6.4 oz (40.1 kg)  01/11/24 90 lb 12.8 oz (41.2 kg)  11/13/23 89 lb 1.6 oz (40.4 kg)   SpO2 Readings from Last 3 Encounters:  02/01/24 100%  01/25/24 100%  01/11/24 100%      Physical Exam Vitals and nursing note reviewed.  Constitutional:      General: She is not in acute distress.    Appearance: Normal appearance. She is well-developed.  HENT:     Head: Normocephalic and atraumatic.  Eyes:     General: No scleral icterus.       Right eye: No discharge.        Left eye: No discharge.  Cardiovascular:     Rate and Rhythm: Normal rate and regular rhythm.     Heart sounds: No murmur heard. Pulmonary:     Effort: Pulmonary effort is normal. No respiratory distress.     Breath sounds: Normal breath sounds.  Musculoskeletal:        General: Normal range of motion.     Cervical back: Normal range of motion and neck supple.     Right lower leg: No edema.     Left lower leg: No edema.  Skin:    General: Skin is warm and dry.  Neurological:     Mental Status: She is alert and oriented to person, place, and time.  Psychiatric:        Mood and Affect: Mood normal.        Behavior: Behavior normal.        Thought Content: Thought content normal.        Judgment: Judgment normal.    No results found for any visits on 02/01/24.  Last CBC Lab Results  Component Value Date   WBC 5.6 01/25/2024   HGB 9.5 (L) 01/25/2024   HCT 30.3 (L) 01/25/2024   MCV 91.8 01/25/2024   MCH  28.8 01/25/2024   RDW 14.7 01/25/2024   PLT 222 01/25/2024   Last metabolic panel Lab Results  Component Value Date   GLUCOSE 98 10/23/2023   NA 130 (L) 10/23/2023   K 4.2 10/23/2023   CL 96 10/23/2023   CO2 25 10/23/2023   BUN 24 (H) 10/23/2023   CREATININE 1.45 (H) 10/23/2023   GFR 31.09 (L) 10/23/2023   CALCIUM 9.4 10/23/2023   PROT 5.9 (L) 10/08/2023   ALBUMIN 3.6 10/08/2023   LABGLOB 2.5 10/08/2023   BILITOT 0.3 10/08/2023   ALKPHOS 63 10/08/2023   AST 32 10/08/2023   ALT 22 10/08/2023   ANIONGAP 7 10/08/2023   Last lipids Lab Results  Component Value Date   CHOL 136 08/07/2023   HDL 59.40 08/07/2023   LDLCALC 63 08/07/2023   LDLDIRECT 165.1 05/10/2008   TRIG 69.0 08/07/2023   CHOLHDL 2 08/07/2023   Last hemoglobin A1c Lab Results  Component Value Date   HGBA1C 5.8 10/27/2013   Last thyroid  functions Lab  Results  Component Value Date   TSH 1.29 08/07/2023   T4TOTAL 10.9 07/01/2022   Last vitamin D  Lab Results  Component Value Date   VD25OH 64.20 08/07/2023   Last vitamin B12 and Folate Lab Results  Component Value Date   VITAMINB12 >1537 (H) 08/07/2023   FOLATE >20.0 ng/mL 05/22/2010      The ASCVD Risk score (Arnett DK, et al., 2019) failed to calculate for the following reasons:   The 2019 ASCVD risk score is only valid for ages 5 to 85   Risk score cannot be calculated because patient has a medical history suggesting prior/existing ASCVD    Assessment & Plan:   Problem List Items Addressed This Visit       Unprioritized   Osteoarthritis of multiple joints   Rx for new wheelchair       Relevant Medications   traMADol  (ULTRAM ) 50 MG tablet   NONFORMULARY OR COMPOUNDED ITEM   Hypothyroidism   This SmartLink has not been configured with any valid records.   Lab Results  Component Value Date   TSH 1.29 08/07/2023         Relevant Orders   TSH   Hyperlipidemia   Encourage heart healthy diet such as MIND or DASH diet,  increase exercise, avoid trans fats, simple carbohydrates and processed foods, consider a krill or fish or flaxseed oil cap daily.        Relevant Orders   Comprehensive metabolic panel with GFR   Lipid panel   Other Visit Diagnoses       Anemia, unspecified type    -  Primary   Relevant Medications   NONFORMULARY OR COMPOUNDED ITEM   Other Relevant Orders   CBC with Differential/Platelet     Kidney function abnormal       Relevant Orders   Comprehensive metabolic panel with GFR     Vitamin D  deficiency         Anxiety       Relevant Medications   ALPRAZolam  (XANAX ) 0.5 MG tablet     Assessment and Plan Assessment & Plan Anemia   Chronic anemia is managed with biweekly erythropoietin  injections. Hemoglobin initially reached the target level of 10 g/dL but decreased after four weeks, necessitating continued injections. She experiences fatigue and itching as side effects, with itching managed by hydrocortisone cream. Blurred vision is reported but not confirmed as a side effect. She inquires about oral alternatives for itching. Continue erythropoietin  injections biweekly, use extra strength Benadryl for itching as needed, and monitor hemoglobin levels regularly. Schedule a follow-up with the hematologist in two weeks.  Vascular Insufficiency   Chronic vascular insufficiency results in swollen feet and ankles, confirmed by vascular and cardiology evaluations. This condition is managed with leg elevation and support hose, with prolonged standing over fifty years contributing to the issue. Continue using support hose and elevate legs to reduce swelling.  Chronic Pain   Chronic pain is managed with tramadol . A new prescription is required as refills are not permitted without it. Provide a new prescription for tramadol .  Anxiety   Anxiety is managed with alprazolam . A new prescription is required as refills are not permitted without it. Provide a new prescription for  alprazolam .  Mobility Issues   She has difficulty with her current wheelchair due to weight and a history of eye surgeries. [employer] will cover a new lightweight transport chair with a prescription. She prefers a transport chair similar to one found on [employer], which  folds like a stroller. Write a prescription for a lightweight transport chair.    No follow-ups on file.    Aryahna Spagna R Lowne Chase, DO

## 2024-02-01 NOTE — Assessment & Plan Note (Signed)
 Rx for new wheelchair

## 2024-02-01 NOTE — Patient Instructions (Signed)
Eating Plan for Osteoporosis Osteoporosis causes your bones to become weak and brittle. This puts you at greater risk for bone breaks (fractures) from small bumps or falls. Making changes to your diet and increasing your physical activity can help strengthen your bones and improve your overall health. Calcium and vitamin D are nutrients that play an important role in bone health. Vitamin D helps your body use calcium and strengthen bones. It is important to get enough calcium and vitamin D as part of your eating plan for osteoporosis. What are tips for following this plan? Reading food labels Try to get at least 1,000 milligrams (mg) of calcium each day. Look for foods that have at least 50 mg of calcium per serving. Talk with your health care provider about taking a calcium supplement if you do not get enough calcium from food. Do not have more than 2,500 mg of calcium each day. This is the upper limit for food and nutritional supplements combined. Too much calcium may cause constipation and prevent you from absorbing other important nutrients. Choose foods that contain vitamin D. Take a daily vitamin supplement that contains 800-1,000 international units (IU) of vitamin D. The amount may be different depending on your age, body weight, and where you live. Talk with your dietitian or health care provider about how much vitamin D is right for you. Avoid foods that have more than 300 mg of sodium per serving. Too much sodium can cause your body to lose calcium. Talk with your dietitian or health care provider about how much sodium you are allowed each day. Shopping Do not buy foods with added salt, including: Salted snacks. Rosita [employer]. Canned soups. Canned meats. Processed meats, such as bacon or precooked or cured meat like sausages or meat loaves. Smoked fish. Meal planning Eat balanced meals that contain protein foods, fruits and vegetables, and foods rich in calcium and vitamin D. Eat at least  5 servings of fruits and vegetables each day. Eat 5-6 oz (142-170 g) of lean meat, poultry, fish, eggs, or beans each day. Lifestyle Do not use any products that contain nicotine or tobacco, such as cigarettes, e-cigarettes, and chewing tobacco. If you need help quitting, ask your health care provider. If your health care provider recommends that you lose weight: Work with a dietitian to develop an eating plan that will help you reach your desired weight goal. Exercise for at least 30 minutes a day, 5 or more days a week, or as told by your health care provider. Work with a physical therapist to develop an exercise plan that includes flexibility, balance, and strength exercises. Do not focus only on aerobic exercise. Do not drink alcohol if: Your health care provider tells you not to drink. You are pregnant, may be pregnant, or are planning to become pregnant. If you drink alcohol: Limit how much you use to: 0-1 drink a day for women. 0-2 drinks a day for men. Be aware of how much alcohol is in your drink. In the [place] one drink equals one 12 oz bottle of beer (355 mL), one 5 oz glass of wine (148 mL), or one 1 oz glass of hard liquor (44 mL). What foods should I eat? Foods high in calcium  Yogurt. Yogurt with fruit. Milk. Evaporated skim milk. Dry milk powder. Calcium-fortified orange juice. Parmesan cheese. Part-skim ricotta cheese. Natural hard cheese. Cream cheese. Cottage cheese. Canned sardines. Canned salmon. Calcium-treated tofu. Calcium-fortified cereal bar. Calcium-fortified cereal. Calcium-fortified graham crackers. Cooked collard greens. Turnip greens. Broccoli.  Kale. Almonds. White beans. Corn tortilla. Foods high in vitamin D Cod liver oil. Fatty fish, such as tuna, mackerel, and salmon. Milk. Fortified soy milk. Fortified fruit juice. Yogurt. Margarine. Egg yolks. Foods high in protein Beef. Lamb. Pork tenderloin. Chicken breast. Tuna (canned). Fish  fillet. Tofu. Cooked soy beans. Soy patty. Beans (canned or cooked). Cottage cheese. Yogurt. Peanut butter. Pumpkin seeds. Nuts. Sunflower seeds. Hard cheese. Milk or other milk products, such as soy milk. The items listed above may not be a complete list of foods and beverages you can eat. Contact a dietitian for more options. Summary Calcium and vitamin D are nutrients that play an important role in bone health and are an important part of your eating plan for osteoporosis. Eat balanced meals that contain protein foods, fruits and vegetables, and foods rich in calcium and vitamin D. Avoid foods that have more than 300 mg of sodium per serving. Too much sodium can cause your body to lose calcium. Exercise is an important part of prevention and treatment of osteoporosis. Aim for at least 30 minutes a day, 5 days a week. This information is not intended to replace advice given to you by your health care provider. Make sure you discuss any questions you have with your health care provider. Document Revised: 01/26/2020 Document Reviewed: 01/26/2020 Elsevier Patient Education  2024 [employer].

## 2024-02-04 LAB — TSH: TSH: 0.38 u[IU]/mL (ref 0.35–5.50)

## 2024-02-08 ENCOUNTER — Ambulatory Visit: Payer: Self-pay | Admitting: Family Medicine

## 2024-02-08 ENCOUNTER — Encounter: Payer: Self-pay | Admitting: Family Medicine

## 2024-02-08 ENCOUNTER — Telehealth: Payer: Self-pay

## 2024-02-08 ENCOUNTER — Ambulatory Visit

## 2024-02-08 ENCOUNTER — Inpatient Hospital Stay

## 2024-02-08 VITALS — BP 117/49 | HR 73 | Temp 98.2°F | Resp 16

## 2024-02-08 DIAGNOSIS — D631 Anemia in chronic kidney disease: Secondary | ICD-10-CM | POA: Diagnosis not present

## 2024-02-08 DIAGNOSIS — N189 Chronic kidney disease, unspecified: Secondary | ICD-10-CM | POA: Diagnosis not present

## 2024-02-08 DIAGNOSIS — E871 Hypo-osmolality and hyponatremia: Secondary | ICD-10-CM

## 2024-02-08 DIAGNOSIS — M159 Polyosteoarthritis, unspecified: Secondary | ICD-10-CM

## 2024-02-08 DIAGNOSIS — I129 Hypertensive chronic kidney disease with stage 1 through stage 4 chronic kidney disease, or unspecified chronic kidney disease: Secondary | ICD-10-CM | POA: Diagnosis not present

## 2024-02-08 LAB — FERRITIN: Ferritin: 154 ng/mL (ref 11–307)

## 2024-02-08 LAB — CBC WITH DIFFERENTIAL (CANCER CENTER ONLY)
Abs Immature Granulocytes: 0 10*3/uL (ref 0.00–0.07)
Basophils Absolute: 0 10*3/uL (ref 0.0–0.1)
Basophils Relative: 0 %
Eosinophils Absolute: 0.1 10*3/uL (ref 0.0–0.5)
Eosinophils Relative: 1 %
HCT: 32.1 % — ABNORMAL LOW (ref 36.0–46.0)
Hemoglobin: 10.5 g/dL — ABNORMAL LOW (ref 12.0–15.0)
Immature Granulocytes: 0 %
Lymphocytes Relative: 35 %
Lymphs Abs: 1.7 10*3/uL (ref 0.7–4.0)
MCH: 29.4 pg (ref 26.0–34.0)
MCHC: 32.7 g/dL (ref 30.0–36.0)
MCV: 89.9 fL (ref 80.0–100.0)
Monocytes Absolute: 0.5 10*3/uL (ref 0.1–1.0)
Monocytes Relative: 10 %
Neutro Abs: 2.6 10*3/uL (ref 1.7–7.7)
Neutrophils Relative %: 54 %
Platelet Count: 233 10*3/uL (ref 150–400)
RBC: 3.57 MIL/uL — ABNORMAL LOW (ref 3.87–5.11)
RDW: 14.1 % (ref 11.5–15.5)
WBC Count: 4.9 10*3/uL (ref 4.0–10.5)
nRBC: 0 % (ref 0.0–0.2)

## 2024-02-08 LAB — IRON AND TIBC
Iron: 95 ug/dL (ref 28–170)
Saturation Ratios: 29 % (ref 10.4–31.8)
TIBC: 329 ug/dL (ref 250–450)
UIBC: 234 ug/dL

## 2024-02-08 MED ORDER — TRAMADOL HCL 50 MG PO TABS: 50.0000 mg | ORAL_TABLET | Freq: Four times a day (QID) | ORAL | 1 refills | Status: DC | PRN

## 2024-02-08 MED ORDER — EPOETIN ALFA-EPBX 10000 UNIT/ML IJ SOLN
10000.0000 [IU] | Freq: Once | INTRAMUSCULAR | Status: AC
  Administered 2024-02-08: 10000 [IU] via SUBCUTANEOUS
  Filled 2024-02-08: qty 1

## 2024-02-08 NOTE — Telephone Encounter (Signed)
 Copied from CRM [id number]. Topic: Clinical - Prescription Issue >> Feb 08, 2024  2:19 PM Kita Perish H wrote: Reason for CRM: [name] with [facility] calling regarding patients traMADol  (ULTRAM ) 50 MG tablet patient has not filled in last 3 months and insurance will fill for only a 7 day supply, and wants to verify if patient has had medication in the last 3 months  [name] [phone] fax

## 2024-02-08 NOTE — Telephone Encounter (Signed)
 Spoke with mail order pharmacy. Insurance says if pt has not filled Rx anywhere else in the last 3 months, they will only allow a 7 day supply unless we can vouch that pt is only taking as needed. Verified with pt's daughter that pt may go 2 or 3 weeks and not need medication then needs it a few days a week. Provided update to Eman, pharmacist at [facility]. She adjusted the quantity to # 90 x 1 refill with same directions (every 6 hours as needed) and rx went thru. Medication list update to reflect quantity change.

## 2024-02-22 ENCOUNTER — Ambulatory Visit

## 2024-02-22 ENCOUNTER — Other Ambulatory Visit

## 2024-02-22 ENCOUNTER — Ambulatory Visit: Admitting: Oncology

## 2024-02-29 ENCOUNTER — Inpatient Hospital Stay: Admitting: Oncology

## 2024-02-29 ENCOUNTER — Inpatient Hospital Stay

## 2024-02-29 ENCOUNTER — Inpatient Hospital Stay: Attending: Nurse Practitioner

## 2024-02-29 VITALS — BP 138/64 | HR 69 | Temp 98.1°F | Resp 18 | Ht 62.0 in | Wt 92.7 lb

## 2024-02-29 DIAGNOSIS — I129 Hypertensive chronic kidney disease with stage 1 through stage 4 chronic kidney disease, or unspecified chronic kidney disease: Secondary | ICD-10-CM | POA: Diagnosis not present

## 2024-02-29 DIAGNOSIS — N189 Chronic kidney disease, unspecified: Secondary | ICD-10-CM | POA: Diagnosis not present

## 2024-02-29 DIAGNOSIS — D631 Anemia in chronic kidney disease: Secondary | ICD-10-CM | POA: Diagnosis not present

## 2024-02-29 LAB — CBC WITH DIFFERENTIAL (CANCER CENTER ONLY)
Abs Immature Granulocytes: 0.01 K/uL (ref 0.00–0.07)
Basophils Absolute: 0 K/uL (ref 0.0–0.1)
Basophils Relative: 1 %
Eosinophils Absolute: 0 K/uL (ref 0.0–0.5)
Eosinophils Relative: 1 %
HCT: 30.7 % — ABNORMAL LOW (ref 36.0–46.0)
Hemoglobin: 9.8 g/dL — ABNORMAL LOW (ref 12.0–15.0)
Immature Granulocytes: 0 %
Lymphocytes Relative: 36 %
Lymphs Abs: 2 K/uL (ref 0.7–4.0)
MCH: 29 pg (ref 26.0–34.0)
MCHC: 31.9 g/dL (ref 30.0–36.0)
MCV: 90.8 fL (ref 80.0–100.0)
Monocytes Absolute: 0.5 K/uL (ref 0.1–1.0)
Monocytes Relative: 9 %
Neutro Abs: 3.1 K/uL (ref 1.7–7.7)
Neutrophils Relative %: 53 %
Platelet Count: 255 K/uL (ref 150–400)
RBC: 3.38 MIL/uL — ABNORMAL LOW (ref 3.87–5.11)
RDW: 13.8 % (ref 11.5–15.5)
WBC Count: 5.7 K/uL (ref 4.0–10.5)
nRBC: 0 % (ref 0.0–0.2)

## 2024-02-29 MED ORDER — EPOETIN ALFA-EPBX 10000 UNIT/ML IJ SOLN: 15000.0000 [IU] | Freq: Once | INTRAMUSCULAR | Status: DC

## 2024-02-29 MED ORDER — EPOETIN ALFA-EPBX 10000 UNIT/ML IJ SOLN
10000.0000 [IU] | Freq: Once | INTRAMUSCULAR | Status: AC
  Administered 2024-02-29: 10000 [IU] via SUBCUTANEOUS

## 2024-02-29 NOTE — Progress Notes (Signed)
  [facility] OFFICE PROGRESS NOTE   Diagnosis: Anemia, renal insufficiency  INTERVAL HISTORY:   [name] returns as scheduled.  She is currently maintained on Retacrit  every 2 weeks.  No bleeding or symptom of thrombosis.  She is taking iron.  She reports constipation.  She continues to have generalized weakness.  Objective:  Vital signs in last 24 hours:  Blood pressure 138/64, pulse 69, temperature 98.1 F (36.7 C), resp. rate 18, height 5' 2 (1.575 m), weight 92 lb 11.2 oz (42 kg), SpO2 100%.    Resp: Lungs clear bilaterally Cardio: Regular rate and rhythm GI: No hepatosplenomegaly Vascular: No leg edema   Lab Results:  Lab Results  Component Value Date   WBC 5.7 02/29/2024   HGB 9.8 (L) 02/29/2024   HCT 30.7 (L) 02/29/2024   MCV 90.8 02/29/2024   PLT 255 02/29/2024   NEUTROABS 3.1 02/29/2024    CMP  Lab Results  Component Value Date   NA 129 (L) 02/01/2024   K 5.2 (H) 02/01/2024   CL 96 02/01/2024   CO2 26 02/01/2024   GLUCOSE 98 02/01/2024   BUN 13 02/01/2024   CREATININE 0.87 02/01/2024   CALCIUM 9.1 02/01/2024   PROT 6.2 02/01/2024   ALBUMIN 3.9 02/01/2024   AST 23 02/01/2024   ALT 16 02/01/2024   ALKPHOS 61 02/01/2024   BILITOT 0.4 02/01/2024   GFRNONAA 58 (L) 10/08/2023   GFRAA 58 (L) 07/07/2018     Medications: I have reviewed the patient's current medications.   Assessment/Plan: Anemia Seen by Dr. Cloretta 2009-2012, bone marrow biopsy on 04/18/2008 with findings of a normocellular marrow with trilineage hematopoiesis and maturation; small lymphoid aggregates; flow cytometry showed no evidence of a monoclonal B-cell population or aberrant T-cell expression. Anemia felt to be secondary to renal insufficiency, received erythropoietin  therapy Recurrent anemia December 2024 10/08/2023 erythropoietin  16.5; myeloma panel with no M spike, IFE unremarkable with no evidence of monoclonal protein, normal immunoglobulin levels Stool  cards positive for occult blood x 3, referred to GI, no plan for endoscopic evaluation, referred for CTs-11/09/2023: No evidence of metastatic disease, slight increase size of adjacent cystic lesions in the pancreas head Retacrit  10,000 units 10/16/2023, 10/30/2023, 11/13/2023, 12/14/2023, 01/11/2024 Retacrit  changed to 10,000 units every 2 weeks beginning 01/11/2024 Osteoporosis Restless legs Hypercholesterolemia History of heart surgery Osteoarthritis History of cystic lesions at head of the pancreas 10/06/2014: Cryoablation of right renal mass CTs 11/09/2023-cryoablation defect right upper pole kidney without definite soft tissue nodularity.  No evidence of metastatic disease in the abdomen or pelvis.  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  Cholelithiasis.  Gas/fluid levels in the colon.     Disposition: [name] appears stable.  The hemoglobin is slightly lower today, but remains near the goal range.  She would like to change to monthly dosing of the erythropoietin .  The plan is to keep the erythropoietin  at the current dose and changed to every 4-week dosing.  We will increase the dose if the hemoglobin falls.  She will return for an office visit in 2 months.  Arley Cloretta, MD  02/29/2024  8:49 AM

## 2024-03-02 ENCOUNTER — Other Ambulatory Visit (INDEPENDENT_AMBULATORY_CARE_PROVIDER_SITE_OTHER)

## 2024-03-02 DIAGNOSIS — E871 Hypo-osmolality and hyponatremia: Secondary | ICD-10-CM | POA: Diagnosis not present

## 2024-03-02 LAB — COMPREHENSIVE METABOLIC PANEL WITH GFR
ALT: 18 U/L (ref 0–35)
AST: 27 U/L (ref 0–37)
Albumin: 3.6 g/dL (ref 3.5–5.2)
Alkaline Phosphatase: 63 U/L (ref 39–117)
BUN: 15 mg/dL (ref 6–23)
CO2: 31 meq/L (ref 19–32)
Calcium: 9.3 mg/dL (ref 8.4–10.5)
Chloride: 97 meq/L (ref 96–112)
Creatinine, Ser: 0.85 mg/dL (ref 0.40–1.20)
GFR: 58.88 mL/min — ABNORMAL LOW (ref >60.00)
Glucose, Bld: 101 mg/dL — ABNORMAL HIGH (ref 70–99)
Potassium: 4.2 meq/L (ref 3.5–5.1)
Sodium: 132 meq/L — ABNORMAL LOW (ref 135–145)
Total Bilirubin: 0.3 mg/dL (ref 0.2–1.2)
Total Protein: 5.6 g/dL — ABNORMAL LOW (ref 6.0–8.3)

## 2024-03-03 ENCOUNTER — Ambulatory Visit: Payer: Self-pay | Admitting: Family Medicine

## 2024-03-08 ENCOUNTER — Ambulatory Visit (INDEPENDENT_AMBULATORY_CARE_PROVIDER_SITE_OTHER)

## 2024-03-08 VITALS — Ht 62.0 in | Wt 92.0 lb

## 2024-03-08 DIAGNOSIS — Z0000 Encounter for general adult medical examination without abnormal findings: Secondary | ICD-10-CM

## 2024-03-08 NOTE — Patient Instructions (Signed)
 [name] , Thank you for taking time out of your busy schedule to complete your Annual Wellness Visit with me. I enjoyed our conversation and look forward to speaking with you again next year. I, as well as your care team,  appreciate your ongoing commitment to your health goals. Please review the following plan we discussed and let me know if I can assist you in the future. Your Game plan/ To Do List    Follow up Visits: Next Medicare AWV with our clinical staff: In 1 year    Have you seen your provider in the last 6 months (3 months if uncontrolled diabetes)? Yes Next Office Visit with your provider: 08/01/24 @ 8:40  Clinician Recommendations:  Aim for 30 minutes of exercise or brisk walking, 6-8 glasses of water, and 5 servings of fruits and vegetables each day.       This is a list of the screening recommended for you and due dates:  Health Maintenance  Topic Date Due   Zoster (Shingles) Vaccine (1 of 2) Never done   COVID-19 Vaccine (6 - 2024-25 season) 04/26/2023   Flu Shot  03/25/2024   Medicare Annual Wellness Visit  03/08/2025   DTaP/Tdap/Td vaccine (3 - Td or Tdap) 04/12/2033   Pneumococcal Vaccine for age over 70  Completed   DEXA scan (bone density measurement)  Completed   Hepatitis B Vaccine  Aged Out   HPV Vaccine  Aged Out   Meningitis B Vaccine  Aged Out    Advanced directives: (ACP Link)Information on Advanced Care Planning can be found at [place]  Best boy Advance Health Care Directives Advance Health Care Directives. [email or web address]   Advance Care Planning is important because it:  [x]  Makes sure you receive the medical care that is consistent with your values, goals, and preferences  [x]  It provides guidance to your family and loved ones and reduces their decisional burden about whether or not they are making the right decisions based on your wishes.  Follow the link provided in your after visit summary or read over the paperwork we have mailed to  you to help you started getting your Advance Directives in place. If you need assistance in completing these, please reach out to us  so that we can help you!  See attachments for Preventive Care and Fall Prevention Tips.

## 2024-03-08 NOTE — Progress Notes (Signed)
 Subjective:   [name] is a 89 y.o. who presents for a Medicare Wellness preventive visit.  As a reminder, Annual Wellness Visits don't include a physical exam, and some assessments may be limited, especially if this visit is performed virtually. We may recommend an in-person follow-up visit with your provider if needed.  Visit Complete: Virtual I connected with  [name] on 03/08/24 by a audio enabled telemedicine application and verified that I am speaking with the correct person using two identifiers.  Patient Location: Home  Provider Location: Home Office  I discussed the limitations of evaluation and management by telemedicine. The patient expressed understanding and agreed to proceed.  Vital Signs: Because this visit was a virtual/telehealth visit, some criteria may be missing or patient reported. Any vitals not documented were not able to be obtained and vitals that have been documented are patient reported.  VideoDeclined- This patient declined Librarian, academic. Therefore the visit was completed with audio only.  Persons Participating in Visit: Patient.  AWV Questionnaire: No: Patient Medicare AWV questionnaire was not completed prior to this visit.  Cardiac Risk Factors include: advanced age (>68men, >37 women);dyslipidemia     Objective:    Today's Vitals   03/08/24 0952  Weight: 92 lb (41.7 kg)  Height: 5' 2 (1.575 m)   Body mass index is 16.83 kg/m.     03/08/2024    9:55 AM 02/29/2024    8:51 AM 01/11/2024    9:00 AM 11/13/2023    8:09 AM 11/04/2023    1:28 PM 10/30/2023    3:15 PM 10/08/2023    2:04 PM  Advanced Directives  Does Patient Have a Medical Advance Directive? Yes Yes Yes No No No No  Type of Special educational needs teacher of [place];Living will Healthcare Power of [place];Living will      Does patient want to make changes to medical advance directive?  No - Patient declined No - Patient declined       Copy of Healthcare Power of Attorney in Chart?  No - copy requested No - copy requested      Would patient like information on creating a medical advance directive? Yes (MAU/Ambulatory/Procedural Areas - Information given)   No - Patient declined No - Patient declined No - Patient declined No - Patient declined    Current Medications (verified) Outpatient Encounter Medications as of 03/08/2024  Medication Sig   ALPRAZolam  (XANAX ) 0.5 MG tablet Take 1 tablet (0.5 mg total) by mouth 2 (two) times daily as needed. for anxiety   aspirin 81 MG EC tablet Take 81 mg by mouth daily.   bisacodyl (DULCOLAX) 5 MG EC tablet Take 10 mg by mouth daily as needed for moderate constipation.   Cholecalciferol (VITAMIN D3) 25 MCG (1000 UT) CAPS Take 1 capsule (1,000 Units total) by mouth daily.   ferrous sulfate 324 MG TBEC Take 324 mg by mouth 2 (two) times daily.   fluticasone  (FLONASE ) 50 MCG/ACT nasal spray Place 2 sprays into both nostrils daily as needed for allergies or rhinitis.   folic acid  (FOLVITE ) 1 MG tablet Take 1 tablet (1 mg total) by mouth daily.   furosemide  (LASIX ) 40 MG tablet Take 1 tablet (40 mg total) by mouth daily as needed.   hydroxypropyl methylcellulose / hypromellose (ISOPTO TEARS / GONIOVISC) 2.5 % ophthalmic solution Place 1 drop into both eyes daily as needed for dry eyes.   ibuprofen (ADVIL) 200 MG tablet Take 400 mg by  mouth every 6 (six) hours as needed for mild pain.   levothyroxine  (SYNTHROID ) 50 MCG tablet Take 1 tablet (50 mcg total) by mouth daily.   Multiple Vitamin (MULTIVITAMIN WITH MINERALS) TABS tablet Take 1 tablet by mouth daily.   NONFORMULARY OR COMPOUNDED ITEM Lightweight transport chair   dx-- osteoarthritis , weakness, pvd   oxymetazoline (AFRIN) 0.05 % nasal spray Place 1 spray into both nostrils daily as needed for congestion.   potassium chloride  SA (KLOR-CON  M) 20 MEQ tablet Take 1 tablet (20 mEq total) by mouth 2 (two) times daily. (Patient taking  differently: Take 20 mEq by mouth 2 (two) times daily. Currently taking daily)   rOPINIRole  (REQUIP ) 1 MG tablet Take 1 tablet (1 mg total) by mouth at bedtime.   simvastatin  (ZOCOR ) 20 MG tablet Take 1 tablet (20 mg total) by mouth daily. (Patient taking differently: Take 20 mg by mouth daily. Takes 4/week)   tiZANidine  (ZANAFLEX ) 2 MG tablet TAKE 1 TABLET BY MOUTH DAILY AS NEEDED FOR MUSCLE SPASMS   traMADol  (ULTRAM ) 50 MG tablet Take 1 tablet (50 mg total) by mouth every 6 (six) hours as needed. for pain   No facility-administered encounter medications on file as of 03/08/2024.    Allergies (verified) Zofran  [ondansetron  hcl], Clarithromycin, Codeine, Diazepam, Sulfonamide derivatives, Prilosec [omeprazole  magnesium], and Trazodone    History: Past Medical History:  Diagnosis Date   Allergy    Anemia    Anxiety    Cataract 05/2011   Chronic low blood pressure    due to dehydration   Depression    Esophageal disorder    Esophageal yeast infection (HCC)    GERD (gastroesophageal reflux disease)    Hearing loss    hearing aids   Hyperlipidemia    Hypothyroidism    Myocardial infarction [facility])    Osteoporosis    Renal cell carcinoma (HCC)    found on CT   Thyroid  disease    hypothyroidism   Transfusion history    last 2004   Past Surgical History:  Procedure Laterality Date   ABDOMINAL HYSTERECTOMY     APPENDECTOMY     BLEPHAROPLASTY Right    x 3 (all related to basal cell carcinoma)   COLONOSCOPY     CORONARY ARTERY BYPASS GRAFT     x2 vessel bypass -Dr. Wonda -[facility] 06-22-14   EUS N/A 02/04/2018   Procedure: UPPER ENDOSCOPIC ULTRASOUND (EUS) RADIAL;  Surgeon: Teressa Toribio SQUIBB, MD;  Location: [facility];  Service: Endoscopy;  Laterality: N/A;   EYE SURGERY     FINE NEEDLE ASPIRATION N/A 02/04/2018   Procedure: FINE NEEDLE ASPIRATION (FNA) LINEAR;  Surgeon: Teressa Toribio SQUIBB, MD;  Location: [facility];  Service: Endoscopy;  Laterality: N/A;   FRACTURE  SURGERY     IR GENERIC HISTORICAL  08/29/2014   IR RADIOLOGIST EVAL & MGMT 08/29/2014 Marcey Moan, MD [facility] INTERV RAD   IR GENERIC HISTORICAL  10/22/2016   IR RADIOLOGIST EVAL & MGMT 10/22/2016 Marcey Moan, MD [facility] INTERV RAD   IR RADIOLOGIST EVAL & MGMT  11/03/2017   IR RADIOLOGIST EVAL & MGMT  02/15/2019   KYPHOSIS SURGERY     obstructed ureter Left    repair-operative   RENAL CRYOABLATION Right 09/2014   TONSILLECTOMY     TOTAL ABDOMINAL HYSTERECTOMY W/ BILATERAL SALPINGOOPHORECTOMY     Family History  Problem Relation Age of Onset   Parkinsonism Father    Hypertension Other    Heart disease Other  CAD.SABRA1ST DEGREE RELATIVE <60   Hyperlipidemia Other    Breast cancer Sister    Breast cancer Other    Arthritis Daughter    Vision loss Daughter    Early death Daughter    Hypertension Daughter    Colon cancer Neg Hx    Esophageal cancer Neg Hx    Rectal cancer Neg Hx    Stomach cancer Neg Hx    Social History   Socioeconomic History   Marital status: Widowed    Spouse name: Not on file   Number of children: 2   Years of education: Not on file   Highest education level: 12th grade  Occupational History   Occupation: Retired    Associate Professor: RETIRED  Tobacco Use   Smoking status: Former    Current packs/day: 0.00    Average packs/day: 1 pack/day for 4.0 years (4.0 ttl pk-yrs)    Types: Cigarettes    Quit date: 08/25/1958    Years since quitting: 65.5   Smokeless tobacco: Never  Substance and Sexual Activity   Alcohol use: No   Drug use: No   Sexual activity: Not Currently    Birth control/protection: Surgical  Other Topics Concern   Not on file  Social History Narrative   REG EXERCISE   WIDOW   LIVES ALONE   END OF LIFE:PATIENT HAS LIVING WILL AND CLEARLY STATES SHE DOES NOT WANT CARDIAC RESUSCITATION,MECHANICLA VENTILATION OR OTHER HEROIC OR FUTILE MEASURES.   Social Drivers of Corporate investment banker Strain: Low Risk  (03/08/2024)   Overall  Financial Resource Strain (CARDIA)    Difficulty of Paying Living Expenses: Not hard at all  Food Insecurity: No Food Insecurity (03/08/2024)   Hunger Vital Sign    Worried About Running Out of Food in the Last Year: Never true    Ran Out of Food in the Last Year: Never true  Transportation Needs: No Transportation Needs (03/08/2024)   PRAPARE - Administrator, Civil Service (Medical): No    Lack of Transportation (Non-Medical): No  Physical Activity: Insufficiently Active (03/08/2024)   Exercise Vital Sign    Days of Exercise per Week: 3 days    Minutes of Exercise per Session: 10 min  Stress: No Stress Concern Present (03/08/2024)   [employer] of Occupational Health - Occupational Stress Questionnaire    Feeling of Stress: Not at all  Social Connections: Moderately Integrated (03/08/2024)   Social Connection and Isolation Panel    Frequency of Communication with Friends and Family: More than three times a week    Frequency of Social Gatherings with Friends and Family: More than three times a week    Attends Religious Services: 1 to 4 times per year    Active Member of [employer] or Organizations: Yes    Attends Banker Meetings: More than 4 times per year    Marital Status: Widowed    Tobacco Counseling Counseling given: Not Answered    Clinical Intake:  Pre-visit preparation completed: Yes  Pain : No/denies pain     Diabetes: No  Lab Results  Component Value Date   HGBA1C 5.8 10/27/2013   HGBA1C  06/01/2008    4.8 (NOTE)   The ADA recommends the following therapeutic goal for glycemic   control related to Hgb A1C measurement:   Goal of Therapy:   < 7.0% Hgb A1C   Reference: American Diabetes Association: Clinical Practice   Recommendations 2008, Diabetes Care,  2008, 31:(Suppl 1).  HGBA1C  02/28/2008    5.7 (NOTE)   The ADA recommends the following therapeutic goal for glycemic   control related to Hgb A1C measurement:   Goal of Therapy:   <  7.0% Hgb A1C   Reference: American Diabetes Association: Clinical Practice   Recommendations 2008, Diabetes Care,  2008, 31:(Suppl 1).     How often do you need to have someone help you when you read instructions, pamphlets, or other written materials from your doctor or pharmacy?: 1 - Never  Interpreter Needed?: No  Information entered by :: Charmaine Bloodgood LPN   Activities of Daily Living     03/08/2024    9:55 AM 03/12/2023    4:00 PM  In your present state of health, do you have any difficulty performing the following activities:  Hearing? 1 1  Comment  totally deaf in left ear, wears hearing aid in right ear  Vision? 0 0  Difficulty concentrating or making decisions? 0 0  Walking or climbing stairs? 1 1  Dressing or bathing? 0 0  Doing errands, shopping? 0 1  Preparing Food and eating ? N N  Using the Toilet? N N  In the past six months, have you accidently leaked urine? N Y  Do you have problems with loss of bowel control? N N  Managing your Medications? N N  Managing your Finances? N N  Housekeeping or managing your Housekeeping? N N    Patient Care Team: Antonio Meth, Jamee SAUNDERS, DO as PCP - General Teressa Toribio SQUIBB, MD (Inactive) as Consulting Physician (Gastroenterology) Luverne Aran, MD as Consulting Physician (Interventional Radiology) [place], Amy, MD as Consulting Physician (Dermatology) Wonda Sharper, MD as Consulting Physician (Cardiology) Cleatus Collar, MD as Consulting Physician (Ophthalmology)  I have updated your Care Teams any recent Medical Services you may have received from other providers in the past year.     Assessment:   This is a routine wellness examination for Littie.  Hearing/Vision screen Hearing Screening - Comments:: Hearing difficulty; hearing aid right ear  Vision Screening - Comments:: Wears rx glasses - up to date with routine eye exams with Dr. Cleatus    Goals Addressed             This Visit's Progress    Maintain  current health and independence   On track      Depression Screen     03/08/2024    9:54 AM 10/08/2023    2:06 PM 03/16/2023    9:11 AM 01/05/2023    8:30 AM 03/11/2022    9:05 AM 03/05/2021    8:28 AM 05/12/2019    9:12 AM  PHQ 2/9 Scores  PHQ - 2 Score 0 0 0 1 0 0 0    Fall Risk     03/08/2024    9:54 AM 03/12/2023    4:00 PM 10/30/2022    2:23 PM 03/11/2022    9:05 AM 03/05/2021    8:27 AM  Fall Risk   Falls in the past year? 0 1 0 0 0  Number falls in past yr: 0 0 0 0 0  Injury with Fall? 0 1 0 0 0  Risk for fall due to : Impaired mobility History of fall(s) No Fall Risks History of fall(s)   Follow up Falls prevention discussed;Education provided;Falls evaluation completed Falls evaluation completed Falls evaluation completed Falls evaluation completed  Falls prevention discussed      Data saved with a previous flowsheet row definition  MEDICARE RISK AT HOME:  Medicare Risk at Home Any stairs in or around the home?: No If so, are there any without handrails?: No Home free of loose throw rugs in walkways, pet beds, electrical cords, etc?: Yes Adequate lighting in your home to reduce risk of falls?: Yes Life alert?: No Use of a cane, walker or w/c?: No Grab bars in the bathroom?: Yes Shower chair or bench in shower?: Yes Elevated toilet seat or a handicapped toilet?: Yes  TIMED UP AND GO:  Was the test performed?  No  Cognitive Function: 6CIT completed    11/05/2017    9:30 AM 10/23/2016    8:08 AM  MMSE - Mini Mental State Exam  Orientation to time 5 5   Orientation to Place 5 5   Registration 3 3   Attention/ Calculation 5 5   Recall 3 3   Language- name 2 objects 2 2   Language- repeat 1 1  Language- follow 3 step command 3 3   Language- read & follow direction 1 1   Write a sentence 1 1   Copy design 1 1   Total score 30 30      Data saved with a previous flowsheet row definition        03/08/2024    9:55 AM 03/16/2023    9:13 AM 03/11/2022    9:10  AM 05/12/2019    9:21 AM  6CIT Screen  What Year? 0 points 0 points 0 points 0 points  What month? 0 points 0 points 0 points 0 points  What time? 0 points 0 points 0 points 0 points  Count back from 20 0 points 0 points 0 points 0 points  Months in reverse 0 points 0 points 0 points 0 points  Repeat phrase 0 points 0 points 0 points 0 points  Total Score 0 points 0 points 0 points 0 points    Immunizations Immunization History  Administered Date(s) Administered   Fluad Quad(high Dose 65+) 06/16/2021, 07/01/2022   Influenza Split 05/26/2011, 05/31/2012, 10/09/2023   Influenza Whole 05/31/2008, 05/17/2009, 05/22/2010   Influenza, High Dose Seasonal PF 05/21/2014, 04/30/2015, 05/18/2016, 05/09/2018, 04/22/2019   Influenza,inj,Quad PF,6+ Mos 06/06/2013   Influenza-Unspecified 04/09/2017, 05/09/2018, 05/21/2020   PFIZER(Purple Top)SARS-COV-2 Vaccination 10/01/2019, 10/26/2019, 05/21/2020, 02/13/2021   Pfizer Covid-19 Vaccine Bivalent Booster 53yrs & up 06/16/2021   Pneumococcal Conjugate-13 08/11/2014, 04/22/2019   Pneumococcal Polysaccharide-23 06/11/2005   Td 04/13/2023   Tdap 06/08/2016    Screening Tests Health Maintenance  Topic Date Due   Zoster Vaccines- Shingrix (1 of 2) Never done   COVID-19 Vaccine (6 - 2024-25 season) 04/26/2023   INFLUENZA VACCINE  03/25/2024   Medicare Annual Wellness (AWV)  03/08/2025   DTaP/Tdap/Td (3 - Td or Tdap) 04/12/2033   Pneumococcal Vaccine: 50+ Years  Completed   DEXA SCAN  Completed   Hepatitis B Vaccines  Aged Out   HPV VACCINES  Aged Out   Meningococcal B Vaccine  Aged Out    Health Maintenance  Health Maintenance Due  Topic Date Due   Zoster Vaccines- Shingrix (1 of 2) Never done   COVID-19 Vaccine (6 - 2024-25 season) 04/26/2023   Health Maintenance Items Addressed: Information provided on Shingrix   Additional Screening:  Vision Screening: Recommended annual ophthalmology exams for early detection of glaucoma and other  disorders of the eye. Would you like a referral to an eye doctor? No    Dental Screening: Recommended annual dental exams for proper oral  hygiene  Community Resource Referral / Chronic Care Management: CRR required this visit?  No   CCM required this visit?  No   Plan:    I have personally reviewed and noted the following in the patient's chart:   Medical and social history Use of alcohol, tobacco or illicit drugs  Current medications and supplements including opioid prescriptions. Patient is not currently taking opioid prescriptions. Functional ability and status Nutritional status Physical activity Advanced directives List of other physicians Hospitalizations, surgeries, and ER visits in previous 12 months Vitals Screenings to include cognitive, depression, and falls Referrals and appointments  In addition, I have reviewed and discussed with patient certain preventive protocols, quality metrics, and best practice recommendations. A written personalized care plan for preventive services as well as general preventive health recommendations were provided to patient.   Lavelle Pfeiffer [place], [place]   2/84/7974   After Visit Summary: (MyChart) Due to this being a telephonic visit, the after visit summary with patients personalized plan was offered to patient via MyChart   Notes: Nothing significant to report at this time.

## 2024-03-28 ENCOUNTER — Inpatient Hospital Stay

## 2024-03-28 ENCOUNTER — Inpatient Hospital Stay: Attending: Nurse Practitioner

## 2024-03-28 VITALS — BP 124/54 | HR 73 | Temp 98.2°F | Resp 18

## 2024-03-28 DIAGNOSIS — N189 Chronic kidney disease, unspecified: Secondary | ICD-10-CM | POA: Diagnosis not present

## 2024-03-28 DIAGNOSIS — D631 Anemia in chronic kidney disease: Secondary | ICD-10-CM

## 2024-03-28 DIAGNOSIS — I129 Hypertensive chronic kidney disease with stage 1 through stage 4 chronic kidney disease, or unspecified chronic kidney disease: Secondary | ICD-10-CM | POA: Diagnosis not present

## 2024-03-28 LAB — CBC WITH DIFFERENTIAL (CANCER CENTER ONLY)
Abs Immature Granulocytes: 0.01 K/uL (ref 0.00–0.07)
Basophils Absolute: 0 K/uL (ref 0.0–0.1)
Basophils Relative: 0 %
Eosinophils Absolute: 0.1 K/uL (ref 0.0–0.5)
Eosinophils Relative: 1 %
HCT: 30.4 % — ABNORMAL LOW (ref 36.0–46.0)
Hemoglobin: 9.7 g/dL — ABNORMAL LOW (ref 12.0–15.0)
Immature Granulocytes: 0 %
Lymphocytes Relative: 37 %
Lymphs Abs: 1.9 K/uL (ref 0.7–4.0)
MCH: 29.3 pg (ref 26.0–34.0)
MCHC: 31.9 g/dL (ref 30.0–36.0)
MCV: 91.8 fL (ref 80.0–100.0)
Monocytes Absolute: 0.5 K/uL (ref 0.1–1.0)
Monocytes Relative: 9 %
Neutro Abs: 2.8 K/uL (ref 1.7–7.7)
Neutrophils Relative %: 53 %
Platelet Count: 268 K/uL (ref 150–400)
RBC: 3.31 MIL/uL — ABNORMAL LOW (ref 3.87–5.11)
RDW: 13.6 % (ref 11.5–15.5)
WBC Count: 5.3 K/uL (ref 4.0–10.5)
nRBC: 0 % (ref 0.0–0.2)

## 2024-03-28 MED ORDER — EPOETIN ALFA-EPBX 10000 UNIT/ML IJ SOLN
10000.0000 [IU] | Freq: Once | INTRAMUSCULAR | Status: AC
  Administered 2024-03-28: 10000 [IU] via SUBCUTANEOUS
  Filled 2024-03-28: qty 1

## 2024-03-28 NOTE — Patient Instructions (Signed)

## 2024-04-12 ENCOUNTER — Other Ambulatory Visit: Payer: Self-pay | Admitting: Family Medicine

## 2024-04-26 ENCOUNTER — Inpatient Hospital Stay

## 2024-04-26 ENCOUNTER — Inpatient Hospital Stay: Attending: Nurse Practitioner | Admitting: Oncology

## 2024-04-26 VITALS — BP 122/59 | HR 78 | Temp 98.4°F | Resp 18 | Ht 62.0 in | Wt 92.8 lb

## 2024-04-26 DIAGNOSIS — I129 Hypertensive chronic kidney disease with stage 1 through stage 4 chronic kidney disease, or unspecified chronic kidney disease: Secondary | ICD-10-CM | POA: Insufficient documentation

## 2024-04-26 DIAGNOSIS — N189 Chronic kidney disease, unspecified: Secondary | ICD-10-CM | POA: Diagnosis not present

## 2024-04-26 DIAGNOSIS — D631 Anemia in chronic kidney disease: Secondary | ICD-10-CM | POA: Diagnosis not present

## 2024-04-26 LAB — CBC WITH DIFFERENTIAL (CANCER CENTER ONLY)
Abs Immature Granulocytes: 0.01 K/uL (ref 0.00–0.07)
Basophils Absolute: 0 K/uL (ref 0.0–0.1)
Basophils Relative: 0 %
Eosinophils Absolute: 0.1 K/uL (ref 0.0–0.5)
Eosinophils Relative: 1 %
HCT: 31.8 % — ABNORMAL LOW (ref 36.0–46.0)
Hemoglobin: 10.1 g/dL — ABNORMAL LOW (ref 12.0–15.0)
Immature Granulocytes: 0 %
Lymphocytes Relative: 36 %
Lymphs Abs: 2.3 K/uL (ref 0.7–4.0)
MCH: 28.9 pg (ref 26.0–34.0)
MCHC: 31.8 g/dL (ref 30.0–36.0)
MCV: 91.1 fL (ref 80.0–100.0)
Monocytes Absolute: 0.5 K/uL (ref 0.1–1.0)
Monocytes Relative: 8 %
Neutro Abs: 3.5 K/uL (ref 1.7–7.7)
Neutrophils Relative %: 55 %
Platelet Count: 263 K/uL (ref 150–400)
RBC: 3.49 MIL/uL — ABNORMAL LOW (ref 3.87–5.11)
RDW: 13.7 % (ref 11.5–15.5)
WBC Count: 6.4 K/uL (ref 4.0–10.5)
nRBC: 0 % (ref 0.0–0.2)

## 2024-04-26 MED ORDER — EPOETIN ALFA-EPBX 10000 UNIT/ML IJ SOLN
10000.0000 [IU] | Freq: Once | INTRAMUSCULAR | Status: AC
  Administered 2024-04-26: 10000 [IU] via SUBCUTANEOUS

## 2024-04-26 NOTE — Progress Notes (Signed)
  [facility] OFFICE PROGRESS NOTE   Diagnosis: Anemia, chronic renal failure  INTERVAL HISTORY:   [name] returns as scheduled.  She continues erythropoietin  therapy.  No bleeding or symptom of thrombosis.  No new complaint.  She continues to have malaise.  Objective:  Vital signs in last 24 hours:  Blood pressure (!) 122/59, pulse 78, temperature 98.4 F (36.9 C), temperature source Temporal, resp. rate 18, height 5' 2 (1.575 m), weight 92 lb 12.8 oz (42.1 kg), SpO2 100%.    Resp: Lungs clear bilaterally Cardio: Regular rate and rhythm GI: No hepatosplenomegaly Vascular: No leg edema   Lab Results:  Lab Results  Component Value Date   WBC 6.4 04/26/2024   HGB 10.1 (L) 04/26/2024   HCT 31.8 (L) 04/26/2024   MCV 91.1 04/26/2024   PLT 263 04/26/2024   NEUTROABS 3.5 04/26/2024    CMP  Lab Results  Component Value Date   NA 132 (L) 03/02/2024   K 4.2 03/02/2024   CL 97 03/02/2024   CO2 31 03/02/2024   GLUCOSE 101 (H) 03/02/2024   BUN 15 03/02/2024   CREATININE 0.85 03/02/2024   CALCIUM 9.3 03/02/2024   PROT 5.6 (L) 03/02/2024   ALBUMIN 3.6 03/02/2024   AST 27 03/02/2024   ALT 18 03/02/2024   ALKPHOS 63 03/02/2024   BILITOT 0.3 03/02/2024   GFRNONAA 58 (L) 10/08/2023   GFRAA 58 (L) 07/07/2018    No results found for: CEA1, CEA, CAN199, CA125  Lab Results  Component Value Date   INR 0.89 09/29/2014   LABPROT 12.1 09/29/2014    Imaging:  No results found.  Medications: I have reviewed the patient's current medications.   Assessment/Plan: Anemia Seen by Dr. Cloretta 2009-2012, bone marrow biopsy on 04/18/2008 with findings of a normocellular marrow with trilineage hematopoiesis and maturation; small lymphoid aggregates; flow cytometry showed no evidence of a monoclonal B-cell population or aberrant T-cell expression. Anemia felt to be secondary to renal insufficiency, received erythropoietin  therapy Recurrent anemia  December 2024 10/08/2023 erythropoietin  16.5; myeloma panel with no M spike, IFE unremarkable with no evidence of monoclonal protein, normal immunoglobulin levels Stool cards positive for occult blood x 3, referred to GI, no plan for endoscopic evaluation, referred for CTs-11/09/2023: No evidence of metastatic disease, slight increase size of adjacent cystic lesions in the pancreas head Retacrit  10,000 units 10/16/2023, 10/30/2023, 11/13/2023, 12/14/2023, 01/11/2024 Retacrit  changed to 10,000 units every 2 weeks beginning 01/11/2024, changed to every 4 weeks 02/29/2024 Osteoporosis Restless legs Hypercholesterolemia History of heart surgery Osteoarthritis History of cystic lesions at head of the pancreas 10/06/2014: Cryoablation of right renal mass CTs 11/09/2023-cryoablation defect right upper pole kidney without definite soft tissue nodularity.  No evidence of metastatic disease in the abdomen or pelvis.  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  Cholelithiasis.  Gas/fluid levels in the colon.      Disposition: [name] appears unchanged.  The hemoglobin is stable.  It is unclear whether she is benefiting from erythropoietin .  Discussed options with [name] and her daughter.  The plan is to continue erythropoietin  on a 4-week schedule.  She will continue iron.  We will check iron studies when she is here next month.  She will return for an office visit in 4 months.  We will consider increasing the erythropoietin  dose and changing to more frequent dosing if the hemoglobin falls.  Arley Cloretta, MD  04/26/2024  8:31 AM

## 2024-04-27 ENCOUNTER — Telehealth: Payer: Self-pay | Admitting: Oncology

## 2024-04-27 NOTE — Telephone Encounter (Signed)
 PT daughter called to reschedule appt. Did not specify reason.

## 2024-05-23 ENCOUNTER — Other Ambulatory Visit

## 2024-05-23 ENCOUNTER — Ambulatory Visit

## 2024-05-24 ENCOUNTER — Inpatient Hospital Stay

## 2024-05-24 VITALS — BP 133/60 | HR 77 | Temp 97.9°F | Resp 16

## 2024-05-24 DIAGNOSIS — I129 Hypertensive chronic kidney disease with stage 1 through stage 4 chronic kidney disease, or unspecified chronic kidney disease: Secondary | ICD-10-CM | POA: Diagnosis not present

## 2024-05-24 DIAGNOSIS — D631 Anemia in chronic kidney disease: Secondary | ICD-10-CM

## 2024-05-24 LAB — IRON AND TIBC
Iron: 81 ug/dL (ref 28–170)
Saturation Ratios: 25 % (ref 10.4–31.8)
TIBC: 328 ug/dL (ref 250–450)
UIBC: 247 ug/dL

## 2024-05-24 LAB — CBC WITH DIFFERENTIAL (CANCER CENTER ONLY)
Abs Immature Granulocytes: 0.01 K/uL (ref 0.00–0.07)
Basophils Absolute: 0 K/uL (ref 0.0–0.1)
Basophils Relative: 1 %
Eosinophils Absolute: 0.1 K/uL (ref 0.0–0.5)
Eosinophils Relative: 1 %
HCT: 33.6 % — ABNORMAL LOW (ref 36.0–46.0)
Hemoglobin: 10.6 g/dL — ABNORMAL LOW (ref 12.0–15.0)
Immature Granulocytes: 0 %
Lymphocytes Relative: 34 %
Lymphs Abs: 1.8 K/uL (ref 0.7–4.0)
MCH: 28.1 pg (ref 26.0–34.0)
MCHC: 31.5 g/dL (ref 30.0–36.0)
MCV: 89.1 fL (ref 80.0–100.0)
Monocytes Absolute: 0.4 K/uL (ref 0.1–1.0)
Monocytes Relative: 8 %
Neutro Abs: 2.9 K/uL (ref 1.7–7.7)
Neutrophils Relative %: 56 %
Platelet Count: 285 K/uL (ref 150–400)
RBC: 3.77 MIL/uL — ABNORMAL LOW (ref 3.87–5.11)
RDW: 13.5 % (ref 11.5–15.5)
WBC Count: 5.2 K/uL (ref 4.0–10.5)
nRBC: 0 % (ref 0.0–0.2)

## 2024-05-24 LAB — FERRITIN: Ferritin: 112 ng/mL (ref 11–307)

## 2024-05-24 MED ORDER — EPOETIN ALFA-EPBX 10000 UNIT/ML IJ SOLN
10000.0000 [IU] | Freq: Once | INTRAMUSCULAR | Status: AC
  Administered 2024-05-24: 10000 [IU] via SUBCUTANEOUS
  Filled 2024-05-24: qty 1

## 2024-05-24 NOTE — Progress Notes (Signed)
 Here for Retacrit  injection. HBG today 10.6 (needs to be <11 for injection). Tolerated injection well.

## 2024-05-24 NOTE — Patient Instructions (Signed)

## 2024-05-29 ENCOUNTER — Encounter: Payer: Self-pay | Admitting: Family Medicine

## 2024-05-30 ENCOUNTER — Other Ambulatory Visit: Payer: Self-pay | Admitting: Family Medicine

## 2024-05-30 DIAGNOSIS — G4700 Insomnia, unspecified: Secondary | ICD-10-CM

## 2024-05-30 MED ORDER — TEMAZEPAM 30 MG PO CAPS: 30.0000 mg | ORAL_CAPSULE | Freq: Every evening | ORAL | 1 refills | Status: AC | PRN

## 2024-06-22 ENCOUNTER — Inpatient Hospital Stay

## 2024-06-22 ENCOUNTER — Inpatient Hospital Stay: Attending: Nurse Practitioner

## 2024-06-22 VITALS — BP 131/56 | HR 74 | Temp 97.7°F | Resp 18

## 2024-06-22 DIAGNOSIS — D631 Anemia in chronic kidney disease: Secondary | ICD-10-CM | POA: Diagnosis not present

## 2024-06-22 DIAGNOSIS — I129 Hypertensive chronic kidney disease with stage 1 through stage 4 chronic kidney disease, or unspecified chronic kidney disease: Secondary | ICD-10-CM | POA: Insufficient documentation

## 2024-06-22 DIAGNOSIS — N189 Chronic kidney disease, unspecified: Secondary | ICD-10-CM | POA: Diagnosis not present

## 2024-06-22 LAB — CBC WITH DIFFERENTIAL (CANCER CENTER ONLY)
Abs Immature Granulocytes: 0.01 K/uL (ref 0.00–0.07)
Basophils Absolute: 0 K/uL (ref 0.0–0.1)
Basophils Relative: 0 %
Eosinophils Absolute: 0 K/uL (ref 0.0–0.5)
Eosinophils Relative: 1 %
HCT: 29 % — ABNORMAL LOW (ref 36.0–46.0)
Hemoglobin: 9.4 g/dL — ABNORMAL LOW (ref 12.0–15.0)
Immature Granulocytes: 0 %
Lymphocytes Relative: 29 %
Lymphs Abs: 1.6 K/uL (ref 0.7–4.0)
MCH: 29 pg (ref 26.0–34.0)
MCHC: 32.4 g/dL (ref 30.0–36.0)
MCV: 89.5 fL (ref 80.0–100.0)
Monocytes Absolute: 0.5 K/uL (ref 0.1–1.0)
Monocytes Relative: 9 %
Neutro Abs: 3.5 K/uL (ref 1.7–7.7)
Neutrophils Relative %: 61 %
Platelet Count: 269 K/uL (ref 150–400)
RBC: 3.24 MIL/uL — ABNORMAL LOW (ref 3.87–5.11)
RDW: 13.7 % (ref 11.5–15.5)
WBC Count: 5.7 K/uL (ref 4.0–10.5)
nRBC: 0 % (ref 0.0–0.2)

## 2024-06-22 MED ORDER — EPOETIN ALFA-EPBX 10000 UNIT/ML IJ SOLN
10000.0000 [IU] | Freq: Once | INTRAMUSCULAR | Status: AC
  Administered 2024-06-22: 10000 [IU] via SUBCUTANEOUS
  Filled 2024-06-22: qty 1

## 2024-06-22 NOTE — Patient Instructions (Signed)

## 2024-06-26 ENCOUNTER — Encounter: Payer: Self-pay | Admitting: Oncology

## 2024-06-27 ENCOUNTER — Telehealth: Payer: Self-pay | Admitting: Oncology

## 2024-06-27 NOTE — Telephone Encounter (Signed)
 PT daughter called to cancel appts. Unspecified reason.

## 2024-07-03 ENCOUNTER — Other Ambulatory Visit: Payer: Self-pay | Admitting: Family Medicine

## 2024-07-03 DIAGNOSIS — G2581 Restless legs syndrome: Secondary | ICD-10-CM

## 2024-07-03 DIAGNOSIS — E039 Hypothyroidism, unspecified: Secondary | ICD-10-CM

## 2024-07-04 ENCOUNTER — Other Ambulatory Visit: Payer: Self-pay

## 2024-07-04 MED ORDER — FLUTICASONE PROPIONATE 50 MCG/ACT NA SUSP: 2.0000 | Freq: Every day | NASAL | 1 refills | Status: AC | PRN

## 2024-07-27 ENCOUNTER — Ambulatory Visit

## 2024-07-27 ENCOUNTER — Emergency Department (HOSPITAL_COMMUNITY)

## 2024-07-27 ENCOUNTER — Emergency Department (HOSPITAL_COMMUNITY)
Admission: EM | Admit: 2024-07-27 | Discharge: 2024-07-27 | Disposition: A | Discharge status: Home / Self Care | Attending: Emergency Medicine | Admitting: Emergency Medicine

## 2024-07-27 ENCOUNTER — Encounter (HOSPITAL_COMMUNITY): Payer: Self-pay

## 2024-07-27 ENCOUNTER — Other Ambulatory Visit

## 2024-07-27 DIAGNOSIS — Z7982 Long term (current) use of aspirin: Secondary | ICD-10-CM | POA: Insufficient documentation

## 2024-07-27 DIAGNOSIS — W010XXA Fall on same level from slipping, tripping and stumbling without subsequent striking against object, initial encounter: Secondary | ICD-10-CM | POA: Insufficient documentation

## 2024-07-27 DIAGNOSIS — S8001XA Contusion of right knee, initial encounter: Secondary | ICD-10-CM | POA: Diagnosis not present

## 2024-07-27 DIAGNOSIS — N189 Chronic kidney disease, unspecified: Secondary | ICD-10-CM | POA: Diagnosis not present

## 2024-07-27 DIAGNOSIS — M1711 Unilateral primary osteoarthritis, right knee: Secondary | ICD-10-CM | POA: Diagnosis not present

## 2024-07-27 DIAGNOSIS — M25461 Effusion, right knee: Secondary | ICD-10-CM | POA: Diagnosis not present

## 2024-07-27 DIAGNOSIS — W19XXXA Unspecified fall, initial encounter: Secondary | ICD-10-CM | POA: Diagnosis not present

## 2024-07-27 DIAGNOSIS — M25562 Pain in left knee: Secondary | ICD-10-CM | POA: Diagnosis not present

## 2024-07-27 DIAGNOSIS — M25561 Pain in right knee: Secondary | ICD-10-CM | POA: Insufficient documentation

## 2024-07-27 MED ORDER — ACETAMINOPHEN 325 MG PO TABS
650.0000 mg | ORAL_TABLET | Freq: Once | ORAL | Status: AC
  Administered 2024-07-27: 650 mg via ORAL
  Filled 2024-07-27: qty 2

## 2024-07-27 NOTE — ED Provider Notes (Signed)
 [place] EMERGENCY DEPARTMENT AT [facility] Provider Note   CSN: [id number] Arrival date & time: 07/27/24  1701     Patient presents with: [name]   [name] is a 89 y.o. female history of CKD, anemia, here presenting with fall.  She states that she slipped and fell onto her right knee when she was trying to bend down to get something.  She states that she has a hard time getting up afterwards.  Patient did not take any pain medicine.  Denies any chest pain or shortness of breath prior to falling and denies passing out.  Patient denies any head injury.   The history is provided by the patient.       Prior to Admission medications   Medication Sig Start Date End Date Taking? Authorizing Provider  ALPRAZolam  (XANAX ) 0.5 MG tablet Take 1 tablet (0.5 mg total) by mouth 2 (two) times daily as needed. for anxiety 02/01/24   Antonio Meth, Jamee SAUNDERS, DO  aspirin 81 MG EC tablet Take 81 mg by mouth daily.    [provider]  bisacodyl (DULCOLAX) 5 MG EC tablet Take 10 mg by mouth daily as needed for moderate constipation.    [provider]  Cholecalciferol (VITAMIN D3) 25 MCG (1000 UT) CAPS Take 1 capsule (1,000 Units total) by mouth daily. 07/01/22   Antonio Meth Jamee SAUNDERS, DO  diphenhydrAMINE (BENADRYL) 25 mg capsule Take 50 mg by mouth in the morning and at bedtime.    [provider]  ferrous sulfate 324 MG TBEC Take 324 mg by mouth 2 (two) times daily.    [provider]  fluticasone  (FLONASE ) 50 MCG/ACT nasal spray Place 2 sprays into both nostrils daily as needed for allergies or rhinitis. 07/04/24   Antonio Meth Jamee SAUNDERS, DO  folic acid  (FOLVITE ) 1 MG tablet Take 1 tablet (1 mg total) by mouth daily. 08/07/23   Antonio Meth Jamee SAUNDERS, DO  furosemide  (LASIX ) 40 MG tablet Take 1 tablet (40 mg total) by mouth daily as needed. 08/07/23   Lowne Chase, Yvonne R, DO  hydroxypropyl methylcellulose / hypromellose (ISOPTO TEARS / GONIOVISC) 2.5 %  ophthalmic solution Place 1 drop into both eyes daily as needed for dry eyes.    [provider]  ibuprofen (ADVIL) 200 MG tablet Take 400 mg by mouth every 6 (six) hours as needed for mild pain.    [provider]  levothyroxine  (SYNTHROID ) 50 MCG tablet TAKE 1 TABLET BY MOUTH DAILY 07/04/24   Lowne Chase, Yvonne R, DO  Multiple Vitamin (MULTIVITAMIN WITH MINERALS) TABS tablet Take 1 tablet by mouth daily.    [provider]  NONFORMULARY OR COMPOUNDED ITEM Lightweight transport chair   dx-- osteoarthritis , weakness, pvd 02/01/24   Antonio Meth, Jamee SAUNDERS, DO  oxymetazoline (AFRIN) 0.05 % nasal spray Place 1 spray into both nostrils daily as needed for congestion.    [provider]  potassium chloride  SA (KLOR-CON  M) 20 MEQ tablet Take 1 tablet (20 mEq total) by mouth 2 (two) times daily. 08/07/23   Antonio Meth Jamee R, DO  rOPINIRole  (REQUIP ) 1 MG tablet TAKE 1 TABLET BY MOUTH AT BEDTIME 07/04/24   Antonio Meth Jamee R, DO  simvastatin  (ZOCOR ) 20 MG tablet Take 1 tablet (20 mg total) by mouth daily. 08/07/23   Antonio Meth Jamee SAUNDERS, DO  temazepam  (RESTORIL ) 30 MG capsule Take 1 capsule (30 mg total) by mouth at bedtime as needed for sleep. 05/30/24  Antonio Meth, Yvonne R, DO  tiZANidine  (ZANAFLEX ) 2 MG tablet TAKE 1 TABLET BY MOUTH DAILY AS NEEDED FOR MUSCLE SPASMS 07/04/24   Antonio Meth, Yvonne R, DO  traMADol  (ULTRAM ) 50 MG tablet Take 1 tablet (50 mg total) by mouth every 6 (six) hours as needed. for pain 02/08/24   Antonio Meth, Jamee R, DO    Allergies: Zofran  [ondansetron  hcl], Clarithromycin, Codeine, Diazepam, Sulfonamide derivatives, Prilosec [omeprazole  magnesium], and Trazodone     Review of Systems  Musculoskeletal:        Right knee pain  All other systems reviewed and are negative.   Updated Vital Signs BP (!) 144/58 (BP Location: Left Arm)   Pulse 84   Temp 98.1 F (36.7 C)   Resp 18   SpO2 99%   Physical Exam Vitals and nursing note  reviewed.  Constitutional:      Comments: Chronically ill but well-appearing  HENT:     Head: Normocephalic and atraumatic.     Nose: Nose normal.     Mouth/Throat:     Mouth: Mucous membranes are moist.  Eyes:     Extraocular Movements: Extraocular movements intact.     Pupils: Pupils are equal, round, and reactive to light.  Cardiovascular:     Rate and Rhythm: Normal rate.     Pulses: Normal pulses.  Pulmonary:     Effort: Pulmonary effort is normal.  Abdominal:     General: Abdomen is flat.  Musculoskeletal:     Cervical back: Normal range of motion.     Comments: Mild tenderness over the right knee.  Patient is able to flex and extend it.  No obvious tib-fib or femur or hip tenderness  Skin:    General: Skin is warm.     Capillary Refill: Capillary refill takes less than 2 seconds.  Neurological:     General: No focal deficit present.     Mental Status: She is oriented to person, place, and time.  Psychiatric:        Mood and Affect: Mood normal.        Behavior: Behavior normal.     (all labs ordered are listed, but only abnormal results are displayed) Labs Reviewed - No data to display  EKG: None  Radiology: No results found.   Procedures   Medications Ordered in the ED  acetaminophen  (TYLENOL ) tablet 650 mg (has no administration in time range)                                    Medical Decision Making [name] is a 89 y.o. female here with fall with right knee pain.  Will get right knee x-ray and give Tylenol  and reassess.  Patient is neurovascularly intact in the right lower extremity  6:42 PM X-ray did not show any fracture.  Patient was given knee immobilizer.  Stable for discharge.  Patient has walker and wheelchair at home  Problems Addressed: Contusion of right knee, initial encounter: acute illness or injury  Amount and/or Complexity of Data Reviewed Radiology: ordered.  Risk OTC drugs.     Final diagnoses:  None    ED  Discharge Orders     None          Patt Alm Macho, MD 07/27/24 1843

## 2024-07-27 NOTE — Discharge Instructions (Addendum)
 You have contusion of your knee.  Please wear the knee brace for comfort and support  Please use your wheelchair and walker  Follow-up with orthopedic doctor  Return to ER if you have severe pain or unable to walk

## 2024-07-27 NOTE — Progress Notes (Signed)
 Orthopedic Tech Progress Note Patient Details:  [name] 23-Sep-1927 [id number]  Ortho Devices Type of Ortho Device: Knee Immobilizer Ortho Device/Splint Location: right Ortho Device/Splint Interventions: Ordered, Application, Adjustment   Post Interventions Patient Tolerated: Well Instructions Provided: Adjustment of device, Care of device  Waylan Thom Loving 07/27/2024, 6:48 PM

## 2024-07-27 NOTE — ED Triage Notes (Signed)
 Pt BIB ems for a fall today, hit her R knee and couldn't get up again. No loc, no blood thinners, did not hit her head, A&ox4, VS stable

## 2024-08-01 ENCOUNTER — Encounter: Payer: Self-pay | Admitting: Family Medicine

## 2024-08-01 ENCOUNTER — Ambulatory Visit: Admitting: Family Medicine

## 2024-08-01 VITALS — BP 146/90 | HR 58 | Temp 98.2°F | Resp 18 | Ht 62.0 in

## 2024-08-01 DIAGNOSIS — N1832 Chronic kidney disease, stage 3b: Secondary | ICD-10-CM | POA: Insufficient documentation

## 2024-08-01 DIAGNOSIS — Z79899 Other long term (current) drug therapy: Secondary | ICD-10-CM

## 2024-08-01 DIAGNOSIS — M545 Low back pain, unspecified: Secondary | ICD-10-CM

## 2024-08-01 DIAGNOSIS — F419 Anxiety disorder, unspecified: Secondary | ICD-10-CM

## 2024-08-01 DIAGNOSIS — E785 Hyperlipidemia, unspecified: Secondary | ICD-10-CM | POA: Diagnosis not present

## 2024-08-01 DIAGNOSIS — E871 Hypo-osmolality and hyponatremia: Secondary | ICD-10-CM | POA: Diagnosis not present

## 2024-08-01 DIAGNOSIS — Z23 Encounter for immunization: Secondary | ICD-10-CM | POA: Diagnosis not present

## 2024-08-01 DIAGNOSIS — G4700 Insomnia, unspecified: Secondary | ICD-10-CM

## 2024-08-01 DIAGNOSIS — E876 Hypokalemia: Secondary | ICD-10-CM | POA: Diagnosis not present

## 2024-08-01 DIAGNOSIS — N289 Disorder of kidney and ureter, unspecified: Secondary | ICD-10-CM

## 2024-08-01 DIAGNOSIS — M159 Polyosteoarthritis, unspecified: Secondary | ICD-10-CM

## 2024-08-01 DIAGNOSIS — E039 Hypothyroidism, unspecified: Secondary | ICD-10-CM

## 2024-08-01 LAB — LIPID PANEL
Cholesterol: 139 mg/dL (ref 0–200)
HDL: 66.1 mg/dL (ref >39.00)
LDL Cholesterol: 64 mg/dL (ref 0–99)
NonHDL: 72.52
Total CHOL/HDL Ratio: 2
Triglycerides: 44 mg/dL (ref 0.0–149.0)
VLDL: 8.8 mg/dL (ref 0.0–40.0)

## 2024-08-01 LAB — CBC WITH DIFFERENTIAL/PLATELET
Basophils Absolute: 0 K/uL (ref 0.0–0.1)
Basophils Relative: 0.4 % (ref 0.0–3.0)
Eosinophils Absolute: 0 K/uL (ref 0.0–0.7)
Eosinophils Relative: 0.2 % (ref 0.0–5.0)
HCT: 30 % — ABNORMAL LOW (ref 36.0–46.0)
Hemoglobin: 10 g/dL — ABNORMAL LOW (ref 12.0–15.0)
Lymphocytes Relative: 19.4 % (ref 12.0–46.0)
Lymphs Abs: 1.2 K/uL (ref 0.7–4.0)
MCHC: 33.3 g/dL (ref 30.0–36.0)
MCV: 88.7 fl (ref 78.0–100.0)
Monocytes Absolute: 0.5 K/uL (ref 0.1–1.0)
Monocytes Relative: 7.5 % (ref 3.0–12.0)
Neutro Abs: 4.4 K/uL (ref 1.4–7.7)
Neutrophils Relative %: 72.5 % (ref 43.0–77.0)
Platelets: 379 K/uL (ref 150.0–400.0)
RBC: 3.39 Mil/uL — ABNORMAL LOW (ref 3.87–5.11)
RDW: 14.7 % (ref 11.5–15.5)
WBC: 6 K/uL (ref 4.0–10.5)

## 2024-08-01 LAB — COMPREHENSIVE METABOLIC PANEL WITH GFR
ALT: 19 U/L (ref 0–35)
AST: 30 U/L (ref 0–37)
Albumin: 4 g/dL (ref 3.5–5.2)
Alkaline Phosphatase: 89 U/L (ref 39–117)
BUN: 9 mg/dL (ref 6–23)
CO2: 27 meq/L (ref 19–32)
Calcium: 10 mg/dL (ref 8.4–10.5)
Chloride: 99 meq/L (ref 96–112)
Creatinine, Ser: 0.69 mg/dL (ref 0.40–1.20)
GFR: 74.36 mL/min (ref >60.00)
Glucose, Bld: 97 mg/dL (ref 70–99)
Potassium: 4.7 meq/L (ref 3.5–5.1)
Sodium: 136 meq/L (ref 135–145)
Total Bilirubin: 0.4 mg/dL (ref 0.2–1.2)
Total Protein: 6.4 g/dL (ref 6.0–8.3)

## 2024-08-01 LAB — TSH: TSH: 1.82 u[IU]/mL (ref 0.35–5.50)

## 2024-08-01 MED ORDER — ALPRAZOLAM 0.5 MG PO TABS: 0.5000 mg | ORAL_TABLET | Freq: Two times a day (BID) | ORAL | 0 refills | Status: AC | PRN

## 2024-08-01 MED ORDER — TRAMADOL HCL 50 MG PO TABS: 50.0000 mg | ORAL_TABLET | Freq: Four times a day (QID) | ORAL | 1 refills | Status: AC | PRN

## 2024-08-01 MED ORDER — FOLIC ACID 1 MG PO TABS: 1.0000 mg | ORAL_TABLET | Freq: Every day | ORAL | 3 refills | Status: AC

## 2024-08-01 MED ORDER — POTASSIUM CHLORIDE CRYS ER 20 MEQ PO TBCR: 20.0000 meq | EXTENDED_RELEASE_TABLET | Freq: Two times a day (BID) | ORAL | 3 refills | Status: AC

## 2024-08-01 MED ORDER — SIMVASTATIN 20 MG PO TABS: 20.0000 mg | ORAL_TABLET | Freq: Every day | ORAL | 3 refills | Status: AC

## 2024-08-01 MED ORDER — FUROSEMIDE 40 MG PO TABS: 40.0000 mg | ORAL_TABLET | Freq: Every day | ORAL | 3 refills | Status: AC | PRN

## 2024-08-01 NOTE — Progress Notes (Signed)
 Subjective:    Patient ID: [name], female    DOB: [birth year], 89 y.o.   MRN: [id number]  Chief Complaint  Patient presents with   Hyperlipidemia   Hypothyroidism   Follow-up    HPI Patient is in today for f/u chronic pain  Discussed the use of AI scribe software for clinical note transcription with the patient, who gave verbal consent to proceed.  History of Present Illness [name] is a 89 year old female with arthritis who presents with knee pain following a fall.  She experienced a fall on July 27, 2024, resulting in knee pain after slipping and hitting her right knee on the floor. This incident led to an emergency room visit where an X-ray was performed, revealing no fracture but a small effusion. A knee immobilizer was applied. She experiences significant pain when weight-bearing, particularly when putting her foot down, but no pain upon palpation. She has adapted to using a walker to manage the pain.  She has a history of arthritis, which may be contributing to her current knee issues. She has previously received gel injections in both knees without relief and has had steroid injections in the past. Her current medications and dosages were not discussed in detail during the conversation.  She has not taken any medication for the pain on the morning of the visit. The pain is improving slightly over time but remains significant when  she has been experiencing weakness after receiving injections, which is not expected but has been observed. She has decided to skip the December injection and plans to reassess in January.   Past Medical History:  Diagnosis Date   Allergy    Anemia    Anxiety    Cataract 05/2011   Chronic low blood pressure    due to dehydration   Depression    Esophageal disorder    Esophageal yeast infection (HCC)    GERD (gastroesophageal reflux disease)    Hearing loss    hearing aids   Hyperlipidemia    Hypothyroidism    Myocardial  infarction [facility])    Osteoporosis    Renal cell carcinoma (HCC)    found on CT   Thyroid  disease    hypothyroidism   Transfusion history    last 2004    Past Surgical History:  Procedure Laterality Date   ABDOMINAL HYSTERECTOMY     APPENDECTOMY     BLEPHAROPLASTY Right    x 3 (all related to basal cell carcinoma)   COLONOSCOPY     CORONARY ARTERY BYPASS GRAFT     x2 vessel bypass -Dr. Wonda -[facility] 06-22-14   EUS N/A 02/04/2018   Procedure: UPPER ENDOSCOPIC ULTRASOUND (EUS) RADIAL;  Surgeon: Teressa Toribio SQUIBB, MD;  Location: [facility] ENDOSCOPY;  Service: Endoscopy;  Laterality: N/A;   EYE SURGERY     FINE NEEDLE ASPIRATION N/A 02/04/2018   Procedure: FINE NEEDLE ASPIRATION (FNA) LINEAR;  Surgeon: Teressa Toribio SQUIBB, MD;  Location: [facility] ENDOSCOPY;  Service: Endoscopy;  Laterality: N/A;   FRACTURE SURGERY     IR GENERIC HISTORICAL  08/29/2014   IR RADIOLOGIST EVAL & MGMT 08/29/2014 Marcey Moan, MD [facility] INTERV RAD   IR GENERIC HISTORICAL  10/22/2016   IR RADIOLOGIST EVAL & MGMT 10/22/2016 Marcey Moan, MD [facility] INTERV RAD   IR RADIOLOGIST EVAL & MGMT  11/03/2017   IR RADIOLOGIST EVAL & MGMT  02/15/2019   KYPHOSIS SURGERY     obstructed ureter Left    repair-operative  RENAL CRYOABLATION Right 09/2014   TONSILLECTOMY     TOTAL ABDOMINAL HYSTERECTOMY W/ BILATERAL SALPINGOOPHORECTOMY      Family History  Problem Relation Age of Onset   Parkinsonism Father    Hypertension Other    Heart disease Other        CAD.SABRA1ST DEGREE RELATIVE <60   Hyperlipidemia Other    Breast cancer Sister    Breast cancer Other    Arthritis Daughter    Vision loss Daughter    Early death Daughter    Hypertension Daughter    Colon cancer Neg Hx    Esophageal cancer Neg Hx    Rectal cancer Neg Hx    Stomach cancer Neg Hx     Social History   Socioeconomic History   Marital status: Widowed    Spouse name: Not on file   Number of children: 2   Years of education: Not on file   Highest  education level: 12th grade  Occupational History   Occupation: Retired    Associate Professor: RETIRED  Tobacco Use   Smoking status: Former    Current packs/day: 0.00    Average packs/day: 1 pack/day for 4.0 years (4.0 ttl pk-yrs)    Types: Cigarettes    Quit date: 08/25/1958    Years since quitting: 65.9   Smokeless tobacco: Never  Substance and Sexual Activity   Alcohol use: No   Drug use: No   Sexual activity: Not Currently    Birth control/protection: Surgical  Other Topics Concern   Not on file  Social History Narrative   REG EXERCISE   WIDOW   LIVES ALONE   END OF LIFE:PATIENT HAS LIVING WILL AND CLEARLY STATES SHE DOES NOT WANT CARDIAC RESUSCITATION,MECHANICLA VENTILATION OR OTHER HEROIC OR FUTILE MEASURES.   Social Drivers of Corporate Investment Banker Strain: Low Risk  (07/25/2024)   Overall Financial Resource Strain (CARDIA)    Difficulty of Paying Living Expenses: Not hard at all  Food Insecurity: No Food Insecurity (07/25/2024)   Hunger Vital Sign    Worried About Running Out of Food in the Last Year: Never true    Ran Out of Food in the Last Year: Never true  Transportation Needs: No Transportation Needs (07/25/2024)   PRAPARE - Administrator, Civil Service (Medical): No    Lack of Transportation (Non-Medical): No  Physical Activity: Insufficiently Active (07/25/2024)   Exercise Vital Sign    Days of Exercise per Week: 2 days    Minutes of Exercise per Session: 10 min  Stress: No Stress Concern Present (07/25/2024)   [employer] of Occupational Health - Occupational Stress Questionnaire    Feeling of Stress: Only a little  Social Connections: Unknown (07/25/2024)   Social Connection and Isolation Panel    Frequency of Communication with Friends and Family: More than three times a week    Frequency of Social Gatherings with Friends and Family: More than three times a week    Attends Religious Services: 1 to 4 times per year    Active Member of [employer] or  Organizations: Patient declined    Attends Banker Meetings: Not on file    Marital Status: Widowed  Intimate Partner Violence: Not At Risk (03/08/2024)   Humiliation, Afraid, Rape, and Kick questionnaire    Fear of Current or Ex-Partner: No    Emotionally Abused: No    Physically Abused: No    Sexually Abused: No    Outpatient Medications Prior to  Visit  Medication Sig Dispense Refill   aspirin 81 MG EC tablet Take 81 mg by mouth daily.     bisacodyl (DULCOLAX) 5 MG EC tablet Take 10 mg by mouth daily as needed for moderate constipation.     Cholecalciferol (VITAMIN D3) 25 MCG (1000 UT) CAPS Take 1 capsule (1,000 Units total) by mouth daily. 60 capsule 3   diphenhydrAMINE (BENADRYL) 25 mg capsule Take 50 mg by mouth in the morning and at bedtime.     ferrous sulfate 324 MG TBEC Take 324 mg by mouth 2 (two) times daily.     fluticasone  (FLONASE ) 50 MCG/ACT nasal spray Place 2 sprays into both nostrils daily as needed for allergies or rhinitis. 48 g 1   hydroxypropyl methylcellulose / hypromellose (ISOPTO TEARS / GONIOVISC) 2.5 % ophthalmic solution Place 1 drop into both eyes daily as needed for dry eyes.     ibuprofen (ADVIL) 200 MG tablet Take 400 mg by mouth every 6 (six) hours as needed for mild pain.     levothyroxine  (SYNTHROID ) 50 MCG tablet TAKE 1 TABLET BY MOUTH DAILY 90 tablet 3   Multiple Vitamin (MULTIVITAMIN WITH MINERALS) TABS tablet Take 1 tablet by mouth daily.     NONFORMULARY OR COMPOUNDED ITEM Lightweight transport chair   dx-- osteoarthritis , weakness, pvd 1 each 0   oxymetazoline (AFRIN) 0.05 % nasal spray Place 1 spray into both nostrils daily as needed for congestion.     rOPINIRole  (REQUIP ) 1 MG tablet TAKE 1 TABLET BY MOUTH AT BEDTIME 90 tablet 3   temazepam  (RESTORIL ) 30 MG capsule Take 1 capsule (30 mg total) by mouth at bedtime as needed for sleep. 30 capsule 1   tiZANidine  (ZANAFLEX ) 2 MG tablet TAKE 1 TABLET BY MOUTH DAILY AS NEEDED FOR MUSCLE  SPASMS 90 tablet 0   ALPRAZolam  (XANAX ) 0.5 MG tablet Take 1 tablet (0.5 mg total) by mouth 2 (two) times daily as needed. for anxiety 180 tablet 0   folic acid  (FOLVITE ) 1 MG tablet Take 1 tablet (1 mg total) by mouth daily. 90 tablet 3   furosemide  (LASIX ) 40 MG tablet Take 1 tablet (40 mg total) by mouth daily as needed. 90 tablet 3   potassium chloride  SA (KLOR-CON  M) 20 MEQ tablet Take 1 tablet (20 mEq total) by mouth 2 (two) times daily. 180 tablet 3   simvastatin  (ZOCOR ) 20 MG tablet Take 1 tablet (20 mg total) by mouth daily. 90 tablet 3   traMADol  (ULTRAM ) 50 MG tablet Take 1 tablet (50 mg total) by mouth every 6 (six) hours as needed. for pain 90 tablet 1   No facility-administered medications prior to visit.    Allergies  Allergen Reactions   Zofran  [Ondansetron  Hcl] Anaphylaxis and Other (See Comments)    Tongue swelling, lost of voice   Clarithromycin     Pt does not remember the reaction   Codeine     Per the pt, It made me disoriented   Diazepam Other (See Comments)    Per the pt, I don't remember the reaction   Sulfonamide Derivatives     Per the pt, It made me disoriented   Prilosec [Omeprazole  Magnesium] Rash   Trazodone  Anxiety    Review of Systems  Constitutional:  Negative for chills, fever and malaise/fatigue.  HENT:  Negative for congestion and hearing loss.   Eyes:  Negative for blurred vision and discharge.  Respiratory:  Negative for cough, sputum production and shortness of breath.  Cardiovascular:  Negative for chest pain, palpitations and leg swelling.  Gastrointestinal:  Negative for abdominal pain, blood in stool, constipation, diarrhea, heartburn, nausea and vomiting.  Genitourinary:  Negative for dysuria, frequency, hematuria and urgency.  Musculoskeletal:  Negative for back pain, falls and myalgias.  Skin:  Negative for rash.  Neurological:  Negative for dizziness, sensory change, loss of consciousness, weakness and headaches.   Endo/Heme/Allergies:  Negative for environmental allergies. Does not bruise/bleed easily.  Psychiatric/Behavioral:  Negative for depression and suicidal ideas. The patient is not nervous/anxious and does not have insomnia.        Objective:    Physical Exam Vitals and nursing note reviewed.  Constitutional:      General: She is not in acute distress.    Appearance: Normal appearance. She is well-developed.  HENT:     Head: Normocephalic and atraumatic.  Eyes:     General: No scleral icterus.       Right eye: No discharge.        Left eye: No discharge.  Cardiovascular:     Rate and Rhythm: Normal rate and regular rhythm.     Heart sounds: No murmur heard. Pulmonary:     Effort: Pulmonary effort is normal. No respiratory distress.     Breath sounds: Normal breath sounds.  Musculoskeletal:        General: Swelling and tenderness present.     Cervical back: Normal range of motion and neck supple.     Right knee: Swelling present. No deformity, effusion, erythema, ecchymosis or lacerations. Decreased range of motion. Tenderness present.     Left knee: Normal.     Right lower leg: No edema.     Left lower leg: No edema.  Skin:    General: Skin is warm and dry.  Neurological:     Mental Status: She is alert and oriented to person, place, and time.  Psychiatric:        Mood and Affect: Mood normal.        Behavior: Behavior normal.        Thought Content: Thought content normal.        Judgment: Judgment normal.     BP (!) 146/90 (BP Location: Left Arm, Patient Position: Sitting, Cuff Size: Small)   Pulse (!) 58   Temp 98.2 F (36.8 C) (Oral)   Resp 18   Ht 5' 2 (1.575 m)   BMI 16.97 kg/m  Wt Readings from Last 3 Encounters:  04/26/24 92 lb 12.8 oz (42.1 kg)  03/08/24 92 lb (41.7 kg)  02/29/24 92 lb 11.2 oz (42 kg)    Diabetic Foot Exam - Simple   No data filed    Lab Results  Component Value Date   WBC 6.0 08/01/2024   HGB 10.0 (L) 08/01/2024   HCT 30.0  (L) 08/01/2024   PLT 379.0 08/01/2024   GLUCOSE 97 08/01/2024   CHOL 139 08/01/2024   TRIG 44.0 08/01/2024   HDL 66.10 08/01/2024   LDLDIRECT 165.1 05/10/2008   LDLCALC 64 08/01/2024   ALT 19 08/01/2024   AST 30 08/01/2024   NA 136 08/01/2024   K 4.7 08/01/2024   CL 99 08/01/2024   CREATININE 0.69 08/01/2024   BUN 9 08/01/2024   CO2 27 08/01/2024   TSH 1.82 08/01/2024   INR 0.89 09/29/2014   HGBA1C 5.8 10/27/2013    Lab Results  Component Value Date   TSH 1.82 08/01/2024   Lab Results  Component Value Date  WBC 6.0 08/01/2024   HGB 10.0 (L) 08/01/2024   HCT 30.0 (L) 08/01/2024   MCV 88.7 08/01/2024   PLT 379.0 08/01/2024   Lab Results  Component Value Date   NA 136 08/01/2024   K 4.7 08/01/2024   CO2 27 08/01/2024   GLUCOSE 97 08/01/2024   BUN 9 08/01/2024   CREATININE 0.69 08/01/2024   BILITOT 0.4 08/01/2024   ALKPHOS 89 08/01/2024   AST 30 08/01/2024   ALT 19 08/01/2024   PROT 6.4 08/01/2024   ALBUMIN 4.0 08/01/2024   CALCIUM 10.0 08/01/2024   ANIONGAP 7 10/08/2023   GFR 74.36 08/01/2024   Lab Results  Component Value Date   CHOL 139 08/01/2024   Lab Results  Component Value Date   HDL 66.10 08/01/2024   Lab Results  Component Value Date   LDLCALC 64 08/01/2024   Lab Results  Component Value Date   TRIG 44.0 08/01/2024   Lab Results  Component Value Date   CHOLHDL 2 08/01/2024   Lab Results  Component Value Date   HGBA1C 5.8 10/27/2013       Assessment & Plan:  Insomnia, unspecified type  Anxiety -     ALPRAZolam ; Take 1 tablet (0.5 mg total) by mouth 2 (two) times daily as needed. for anxiety  Dispense: 180 tablet; Refill: 0 -     TSH  Low back pain, unspecified back pain laterality, unspecified chronicity, unspecified whether sciatica present  High risk medication use  Hyperlipidemia, unspecified hyperlipidemia type Assessment & Plan: Encourage heart healthy diet such as MIND or DASH diet, increase exercise, avoid trans  fats, simple carbohydrates and processed foods, consider a krill or fish or flaxseed oil cap daily.    Orders: -     Simvastatin ; Take 1 tablet (20 mg total) by mouth daily.  Dispense: 90 tablet; Refill: 3 -     CBC with Differential/Platelet -     Comprehensive metabolic panel with GFR -     Lipid panel  Osteoarthritis of multiple joints, unspecified osteoarthritis type -     traMADol  HCl; Take 1 tablet (50 mg total) by mouth every 6 (six) hours as needed. for pain  Dispense: 90 tablet; Refill: 1  Hypokalemia -     Potassium Chloride  Crys ER; Take 1 tablet (20 mEq total) by mouth 2 (two) times daily.  Dispense: 180 tablet; Refill: 3 -     Comprehensive metabolic panel with GFR  Kidney function abnormal -     Furosemide ; Take 1 tablet (40 mg total) by mouth daily as needed.  Dispense: 90 tablet; Refill: 3  Need for influenza vaccination -     Flu vaccine HIGH DOSE PF(Fluzone Trivalent)  Hyponatremia -     Comprehensive metabolic panel with GFR  Hypothyroidism, unspecified type Assessment & Plan: Check labs   Chronic renal failure, stage 3b (HCC)  Other orders -     Folic Acid ; Take 1 tablet (1 mg total) by mouth daily.  Dispense: 90 tablet; Refill: 3  Assessment and Plan Assessment & Plan Right knee contusion with effusion   She sustained a right knee contusion with a small effusion after a fall on December 3rd, 2025. Pain is significant, especially with weight bearing, but X-ray shows no fracture. A possible hyperextension injury is suspected. Pain management and mobility are primary concerns. Continue using the knee immobilizer for support. Monitor for improvement over the next 3-4 weeks. Consider referral to an orthopedic specialist if no improvement occurs.  Osteoarthritis of  multiple joints   Chronic osteoarthritis affects multiple joints, causing joint pain and mobility issues. Previous treatments, including steroid and gel injections, have had limited success. Due to  age-related considerations, potential future knee replacement surgery is discussed. Discuss potential future knee replacement surgery with an orthopedic specialist.  General health maintenance   Flu vaccination and its importance were discussed. Although she has had previous adverse reactions to flu shots, no current contraindications are identified. Vision issues were reported, possibly related to a previous flu shot, but no definitive link is established. Administered flu shot today.    Consandra Laske R Lowne Chase, DO

## 2024-08-01 NOTE — Assessment & Plan Note (Signed)
 Check labs

## 2024-08-01 NOTE — Assessment & Plan Note (Signed)
 Encourage heart healthy diet such as MIND or DASH diet, increase exercise, avoid trans fats, simple carbohydrates and processed foods, consider a krill or fish or flaxseed oil cap daily.

## 2024-08-01 NOTE — Patient Instructions (Signed)
Contusion A contusion is a deep bruise. Contusions are the result of a blunt injury to tissues and muscle fibers under the skin. The injury causes bleeding under the skin. The skin over the contusion may turn blue, purple, or yellow. Minor injuries will give you a painless contusion, but more severe injuries cause contusions that can stay painful and swollen for a few weeks. Follow these instructions at home: Pay attention to any changes in your symptoms. Let your health care provider know about them. Take these actions to relieve your pain. Managing pain, stiffness, and swelling  Use resting, icing, applying pressure (compression), and raising (elevating) the injured area. This is often called the RICE method. Rest the injured area. Return to your normal activities as told by your health care provider. Ask your health care provider what activities are safe for you. If directed, put ice on the injured area. To do this: Put ice in a plastic bag. Place a towel between your skin and the bag. Leave the ice on for 20 minutes, 2-3 times a day. If your skin turns bright red, remove the ice right away to prevent skin damage. The risk of skin damage is higher if you cannot feel pain, heat, or cold. If directed, apply light compression to the injured area using an elastic bandage. Make sure the bandage is not wrapped too tightly. Remove and reapply the bandage as directed by your health care provider. If possible, elevate the injured area above the level of your heart while you are sitting or lying down. General instructions Take over-the-counter and prescription medicines only as told by your health care provider. Keep all follow-up visits. Your health care provider may want to see how your contusion is healing with treatment. Contact a health care provider if: Your symptoms do not improve after several days of treatment. Your symptoms get worse. You have difficulty moving the injured area. Get help  right away if: You have severe pain. You have numbness in a hand or foot. Your hand or foot turns pale or cold. This information is not intended to replace advice given to you by your health care provider. Make sure you discuss any questions you have with your health care provider. Document Revised: 01/27/2022 Document Reviewed: 01/27/2022 Elsevier Patient Education  2024 [employer].

## 2024-08-03 NOTE — Assessment & Plan Note (Signed)
 Chemistry      Component Value Date/Time   NA 136 08/01/2024 0925   NA 139 01/08/2021 0000   K 4.7 08/01/2024 0925   CL 99 08/01/2024 0925   CO2 27 08/01/2024 0925   BUN 9 08/01/2024 0925   BUN 18 01/08/2021 0000   CREATININE 0.69 08/01/2024 0925   CREATININE 0.92 10/08/2023 1540   CREATININE 1.03 (H) 10/18/2016 1147   GLU 98 01/08/2021 0000      Component Value Date/Time   CALCIUM 10.0 08/01/2024 0925   CALCIUM 11.8 (H) 06/02/2008 0545   ALKPHOS 89 08/01/2024 0925   AST 30 08/01/2024 0925   AST 32 10/08/2023 1540   ALT 19 08/01/2024 0925   ALT 22 10/08/2023 1540   BILITOT 0.4 08/01/2024 0925   BILITOT 0.3 10/08/2023 1540

## 2024-08-04 ENCOUNTER — Ambulatory Visit: Payer: Self-pay | Admitting: Family Medicine

## 2024-08-29 DIAGNOSIS — D631 Anemia in chronic kidney disease: Secondary | ICD-10-CM

## 2024-08-30 ENCOUNTER — Inpatient Hospital Stay

## 2024-08-30 ENCOUNTER — Encounter: Payer: Self-pay | Admitting: Nurse Practitioner

## 2024-08-30 ENCOUNTER — Inpatient Hospital Stay: Admitting: Nurse Practitioner

## 2024-08-30 ENCOUNTER — Other Ambulatory Visit

## 2024-08-30 ENCOUNTER — Inpatient Hospital Stay: Attending: Nurse Practitioner

## 2024-08-30 ENCOUNTER — Ambulatory Visit

## 2024-08-30 ENCOUNTER — Ambulatory Visit: Admitting: Oncology

## 2024-08-30 VITALS — BP 114/72 | HR 85 | Temp 97.8°F | Resp 18 | Ht 62.0 in | Wt 95.3 lb

## 2024-08-30 DIAGNOSIS — N189 Chronic kidney disease, unspecified: Secondary | ICD-10-CM

## 2024-08-30 DIAGNOSIS — D631 Anemia in chronic kidney disease: Secondary | ICD-10-CM

## 2024-08-30 DIAGNOSIS — I129 Hypertensive chronic kidney disease with stage 1 through stage 4 chronic kidney disease, or unspecified chronic kidney disease: Secondary | ICD-10-CM | POA: Diagnosis present

## 2024-08-30 LAB — CBC WITH DIFFERENTIAL (CANCER CENTER ONLY)
Abs Immature Granulocytes: 0.01 K/uL (ref 0.00–0.07)
Basophils Absolute: 0 K/uL (ref 0.0–0.1)
Basophils Relative: 1 %
Eosinophils Absolute: 0.1 K/uL (ref 0.0–0.5)
Eosinophils Relative: 1 %
HCT: 26.6 % — ABNORMAL LOW (ref 36.0–46.0)
Hemoglobin: 8.5 g/dL — ABNORMAL LOW (ref 12.0–15.0)
Immature Granulocytes: 0 %
Lymphocytes Relative: 26 %
Lymphs Abs: 1.7 K/uL (ref 0.7–4.0)
MCH: 28.8 pg (ref 26.0–34.0)
MCHC: 32 g/dL (ref 30.0–36.0)
MCV: 90.2 fL (ref 80.0–100.0)
Monocytes Absolute: 0.5 K/uL (ref 0.1–1.0)
Monocytes Relative: 8 %
Neutro Abs: 4 K/uL (ref 1.7–7.7)
Neutrophils Relative %: 64 %
Platelet Count: 307 K/uL (ref 150–400)
RBC: 2.95 MIL/uL — ABNORMAL LOW (ref 3.87–5.11)
RDW: 14 % (ref 11.5–15.5)
WBC Count: 6.3 K/uL (ref 4.0–10.5)
nRBC: 0 % (ref 0.0–0.2)

## 2024-08-30 MED ORDER — EPOETIN ALFA-EPBX 10000 UNIT/ML IJ SOLN
10000.0000 [IU] | Freq: Once | INTRAMUSCULAR | Status: AC
  Administered 2024-08-30: 10000 [IU] via SUBCUTANEOUS
  Filled 2024-08-30: qty 1

## 2024-08-30 NOTE — Progress Notes (Signed)
" °  [facility] OFFICE PROGRESS NOTE   Diagnosis: Anemia, chronic renal failure  INTERVAL HISTORY:   Ms. Banales returns as scheduled.  She last received an erythropoietin  injection 06/22/2024.  She and her daughter decided to cancel subsequent appointments.  Ms. Ebersole tended to feel bad for a few days after each injection.  Overall energy level is poor.  She attributes this to her age.  No bleeding.  Stable dyspnea on exertion.  Objective:  Vital signs in last 24 hours:  Blood pressure 114/72, pulse 85, temperature 97.8 F (36.6 C), temperature source Temporal, resp. rate 18, height 5' 2 (1.575 m), weight 95 lb 4.8 oz (43.2 kg), SpO2 100%.    Resp: Lungs clear bilaterally. Cardio: Regular rate and rhythm. GI: No hepatosplenomegaly. Vascular: Trace edema lower leg bilaterally.   Lab Results:  Lab Results  Component Value Date   WBC 6.0 08/01/2024   HGB 10.0 (L) 08/01/2024   HCT 30.0 (L) 08/01/2024   MCV 88.7 08/01/2024   PLT 379.0 08/01/2024   NEUTROABS 4.4 08/01/2024    Imaging:  No results found.  Medications: I have reviewed the patient's current medications.  Assessment/Plan: Anemia Seen by Dr. Cloretta 2009-2012, bone marrow biopsy on 04/18/2008 with findings of a normocellular marrow with trilineage hematopoiesis and maturation; small lymphoid aggregates; flow cytometry showed no evidence of a monoclonal B-cell population or aberrant T-cell expression. Anemia felt to be secondary to renal insufficiency, received erythropoietin  therapy Recurrent anemia December 2024 10/08/2023 erythropoietin  16.5; myeloma panel with no M spike, IFE unremarkable with no evidence of monoclonal protein, normal immunoglobulin levels Stool cards positive for occult blood x 3, referred to GI, no plan for endoscopic evaluation, referred for CTs-11/09/2023: No evidence of metastatic disease, slight increase size of adjacent cystic lesions in the pancreas head Retacrit  10,000  units 10/16/2023, 10/30/2023, 11/13/2023, 12/14/2023, 01/11/2024 Retacrit  changed to 10,000 units every 2 weeks beginning 01/11/2024, changed to every 4 weeks 02/29/2024 Discontinued per patient following injection 06/22/2024 08/30/2024 hemoglobin 8.5 Retacrit  resumed 08/30/2024 Osteoporosis Restless legs Hypercholesterolemia History of heart surgery Osteoarthritis History of cystic lesions at head of the pancreas 10/06/2014: Cryoablation of right renal mass CTs 11/09/2023-cryoablation defect right upper pole kidney without definite soft tissue nodularity.  No evidence of metastatic disease in the abdomen or pelvis.  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  Cholelithiasis.  Gas/fluid levels in the colon.    Disposition: Ms. Hashimi appears stable.  She was maintained on erythropoietin  from February 2025 to October 2025.  Hemoglobin remained fairly stable during this time.  She decided to hold on further injections and monitor the hemoglobin.  CBC was stable about a month ago.  Today the hemoglobin is lower, 8.5.  She would like to resume the monthly erythropoietin  injections.  She will receive an injection today.  She will return for lab and follow-up in 4 weeks.  We are available to see her sooner if needed.    Olam Ned ANP/GNP-BC   08/30/2024  1:13 PM        "

## 2024-09-22 ENCOUNTER — Telehealth: Payer: Self-pay | Admitting: Nurse Practitioner

## 2024-09-22 NOTE — Telephone Encounter (Signed)
 Rescheduled appt due to the weather;day and time confirmed.

## 2024-09-27 ENCOUNTER — Inpatient Hospital Stay

## 2024-09-27 ENCOUNTER — Inpatient Hospital Stay: Admitting: Nurse Practitioner

## 2024-10-05 ENCOUNTER — Inpatient Hospital Stay

## 2024-10-05 ENCOUNTER — Inpatient Hospital Stay: Admitting: Nurse Practitioner

## 2025-01-30 ENCOUNTER — Ambulatory Visit: Admitting: Family Medicine

## 2025-03-14 ENCOUNTER — Ambulatory Visit
